# Patient Record
Sex: Female | Born: 1980
Health system: Southern US, Community
[De-identification: ages and names within clinical notes are randomized; demographics above are authoritative.]

## PROBLEM LIST (undated history)

## (undated) DIAGNOSIS — E119 Type 2 diabetes mellitus without complications: Secondary | ICD-10-CM

## (undated) DIAGNOSIS — E785 Hyperlipidemia, unspecified: Secondary | ICD-10-CM

## (undated) DIAGNOSIS — I639 Cerebral infarction, unspecified: Secondary | ICD-10-CM

## (undated) DIAGNOSIS — I10 Essential (primary) hypertension: Secondary | ICD-10-CM

## (undated) HISTORY — DX: Cerebral infarction, unspecified: I63.9

## (undated) HISTORY — PX: NO PAST SURGERIES: SHX2092

## (undated) HISTORY — PX: LOOP RECORDER IMPLANT: SHX5954

## (undated) HISTORY — DX: Hyperlipidemia, unspecified: E78.5

---

## 2001-06-25 ENCOUNTER — Encounter: Payer: Self-pay | Admitting: Emergency Medicine

## 2001-06-25 ENCOUNTER — Emergency Department (HOSPITAL_COMMUNITY): Admission: EM | Admit: 2001-06-25 | Discharge: 2001-06-25 | Payer: Self-pay | Admitting: Emergency Medicine

## 2001-07-04 ENCOUNTER — Inpatient Hospital Stay (HOSPITAL_COMMUNITY): Admission: AD | Admit: 2001-07-04 | Discharge: 2001-07-04 | Payer: Self-pay | Admitting: Obstetrics & Gynecology

## 2001-10-25 ENCOUNTER — Emergency Department (HOSPITAL_COMMUNITY): Admission: EM | Admit: 2001-10-25 | Discharge: 2001-10-25 | Payer: Self-pay | Admitting: Emergency Medicine

## 2002-03-04 ENCOUNTER — Emergency Department (HOSPITAL_COMMUNITY): Admission: EM | Admit: 2002-03-04 | Discharge: 2002-03-04 | Payer: Self-pay | Admitting: Emergency Medicine

## 2003-05-29 ENCOUNTER — Emergency Department (HOSPITAL_COMMUNITY): Admission: EM | Admit: 2003-05-29 | Discharge: 2003-05-29 | Payer: Self-pay | Admitting: Emergency Medicine

## 2007-07-26 ENCOUNTER — Emergency Department (HOSPITAL_COMMUNITY): Admission: EM | Admit: 2007-07-26 | Discharge: 2007-07-27 | Payer: Self-pay | Admitting: Emergency Medicine

## 2008-02-28 ENCOUNTER — Emergency Department (HOSPITAL_COMMUNITY): Admission: EM | Admit: 2008-02-28 | Discharge: 2008-02-28 | Payer: Self-pay | Admitting: Emergency Medicine

## 2008-09-13 ENCOUNTER — Emergency Department (HOSPITAL_COMMUNITY): Admission: EM | Admit: 2008-09-13 | Discharge: 2008-09-13 | Payer: Self-pay | Admitting: Emergency Medicine

## 2010-08-26 ENCOUNTER — Inpatient Hospital Stay (HOSPITAL_COMMUNITY): Admission: AD | Admit: 2010-08-26 | Discharge: 2010-08-26 | Payer: Self-pay | Admitting: Obstetrics & Gynecology

## 2010-09-04 ENCOUNTER — Encounter: Payer: Self-pay | Admitting: Obstetrics and Gynecology

## 2010-09-04 ENCOUNTER — Ambulatory Visit: Payer: Self-pay | Admitting: Obstetrics and Gynecology

## 2010-09-04 LAB — CONVERTED CEMR LAB
Antibody Screen: NEGATIVE
Chlamydia, DNA Probe: NEGATIVE
HCT: 45.8 % (ref 36.0–46.0)
Hemoglobin: 15 g/dL (ref 12.0–15.0)
Hgb A: 97.4 % (ref 96.8–97.8)
Lymphocytes Relative: 30 % (ref 12–46)
Lymphs Abs: 2.6 10*3/uL (ref 0.7–4.0)
MCV: 91.1 fL (ref 78.0–100.0)
Monocytes Relative: 8 % (ref 3–12)
Neutro Abs: 5.4 10*3/uL (ref 1.7–7.7)
Neutrophils Relative %: 62 % (ref 43–77)
Platelets: 225 10*3/uL (ref 150–400)
RDW: 14.4 % (ref 11.5–15.5)
Rubella: 79.6 intl units/mL — ABNORMAL HIGH

## 2010-09-05 ENCOUNTER — Encounter: Payer: Self-pay | Admitting: Obstetrics and Gynecology

## 2010-09-06 ENCOUNTER — Emergency Department (HOSPITAL_COMMUNITY)
Admission: EM | Admit: 2010-09-06 | Discharge: 2010-09-07 | Payer: Self-pay | Source: Home / Self Care | Admitting: Emergency Medicine

## 2010-09-08 ENCOUNTER — Inpatient Hospital Stay (HOSPITAL_COMMUNITY)
Admission: AD | Admit: 2010-09-08 | Discharge: 2010-09-08 | Payer: Self-pay | Source: Home / Self Care | Attending: Obstetrics & Gynecology | Admitting: Obstetrics & Gynecology

## 2010-09-09 ENCOUNTER — Inpatient Hospital Stay (HOSPITAL_COMMUNITY)
Admission: RE | Admit: 2010-09-09 | Discharge: 2010-09-09 | Payer: Self-pay | Source: Home / Self Care | Attending: Family Medicine | Admitting: Family Medicine

## 2010-09-13 ENCOUNTER — Ambulatory Visit: Payer: Self-pay | Admitting: Obstetrics and Gynecology

## 2010-09-29 DEATH — deceased

## 2010-10-16 ENCOUNTER — Encounter (INDEPENDENT_AMBULATORY_CARE_PROVIDER_SITE_OTHER): Payer: Self-pay | Admitting: *Deleted

## 2010-10-16 LAB — CONVERTED CEMR LAB
Basophils Absolute: 0 10*3/uL (ref 0.0–0.1)
Calcium: 9.2 mg/dL (ref 8.4–10.5)
Cholesterol: 213 mg/dL — ABNORMAL HIGH (ref 0–200)
Eosinophils Absolute: 0.1 10*3/uL (ref 0.0–0.7)
Hemoglobin: 15.4 g/dL — ABNORMAL HIGH (ref 12.0–15.0)
Lymphs Abs: 2.7 10*3/uL (ref 0.7–4.0)
Monocytes Absolute: 0.4 10*3/uL (ref 0.1–1.0)
Monocytes Relative: 6 % (ref 3–12)
Neutro Abs: 3.7 10*3/uL (ref 1.7–7.7)
Neutrophils Relative %: 54 % (ref 43–77)
Platelets: 207 10*3/uL (ref 150–400)
Potassium: 4.2 meq/L (ref 3.5–5.3)
RBC: 5.13 M/uL — ABNORMAL HIGH (ref 3.87–5.11)
RDW: 14.2 % (ref 11.5–15.5)
Sodium: 136 meq/L (ref 135–145)
TSH: 0.929 microintl units/mL (ref 0.350–4.500)
Total Protein: 7.2 g/dL (ref 6.0–8.3)
Triglycerides: 104 mg/dL (ref <150)

## 2010-10-31 ENCOUNTER — Emergency Department (HOSPITAL_COMMUNITY)
Admission: EM | Admit: 2010-10-31 | Discharge: 2010-10-31 | Disposition: A | Discharge status: Home / Self Care | Payer: Self-pay | Attending: Emergency Medicine | Admitting: Emergency Medicine

## 2010-10-31 DIAGNOSIS — L03319 Cellulitis of trunk, unspecified: Secondary | ICD-10-CM | POA: Insufficient documentation

## 2010-10-31 DIAGNOSIS — F17200 Nicotine dependence, unspecified, uncomplicated: Secondary | ICD-10-CM | POA: Insufficient documentation

## 2010-10-31 DIAGNOSIS — L02219 Cutaneous abscess of trunk, unspecified: Secondary | ICD-10-CM | POA: Insufficient documentation

## 2010-10-31 DIAGNOSIS — E119 Type 2 diabetes mellitus without complications: Secondary | ICD-10-CM | POA: Insufficient documentation

## 2010-10-31 DIAGNOSIS — I10 Essential (primary) hypertension: Secondary | ICD-10-CM | POA: Insufficient documentation

## 2010-10-31 LAB — GLUCOSE, CAPILLARY: Glucose-Capillary: 221 mg/dL — ABNORMAL HIGH (ref 70–99)

## 2010-11-01 ENCOUNTER — Ambulatory Visit: Admit: 2010-11-01 | Payer: Self-pay | Admitting: Obstetrics and Gynecology

## 2010-11-01 ENCOUNTER — Ambulatory Visit: Payer: Self-pay | Admitting: Obstetrics & Gynecology

## 2010-12-10 LAB — POCT I-STAT, CHEM 8
Calcium, Ion: 1.04 mmol/L — ABNORMAL LOW (ref 1.12–1.32)
Glucose, Bld: 200 mg/dL — ABNORMAL HIGH (ref 70–99)
HCT: 48 % — ABNORMAL HIGH (ref 36.0–46.0)
Hemoglobin: 16.3 g/dL — ABNORMAL HIGH (ref 12.0–15.0)
Potassium: 3.8 mEq/L (ref 3.5–5.1)
Sodium: 135 mEq/L (ref 135–145)
TCO2: 24 mmol/L (ref 0–100)

## 2010-12-10 LAB — CBC
HCT: 44.9 % (ref 36.0–46.0)
Hemoglobin: 15.4 g/dL — ABNORMAL HIGH (ref 12.0–15.0)
MCH: 30.2 pg (ref 26.0–34.0)
MCHC: 34.3 g/dL (ref 30.0–36.0)
MCV: 88 fL (ref 78.0–100.0)
Platelets: 219 10*3/uL (ref 150–400)
RDW: 13.8 % (ref 11.5–15.5)
WBC: 10.2 10*3/uL (ref 4.0–10.5)

## 2010-12-10 LAB — URINE CULTURE
Culture  Setup Time: 201112101105
Culture: NO GROWTH

## 2010-12-10 LAB — DIFFERENTIAL
Lymphs Abs: 3.9 10*3/uL (ref 0.7–4.0)
Monocytes Absolute: 0.9 10*3/uL (ref 0.1–1.0)

## 2010-12-10 LAB — URINE MICROSCOPIC-ADD ON

## 2010-12-10 LAB — GC/CHLAMYDIA PROBE AMP, GENITAL
Chlamydia, DNA Probe: NEGATIVE
GC Probe Amp, Genital: NEGATIVE
GC Probe Amp, Genital: NEGATIVE

## 2010-12-10 LAB — URINALYSIS, ROUTINE W REFLEX MICROSCOPIC
Ketones, ur: 15 mg/dL — AB
Nitrite: NEGATIVE
Protein, ur: 30 mg/dL — AB
Specific Gravity, Urine: 1.031 — ABNORMAL HIGH (ref 1.005–1.030)
pH: 6 (ref 5.0–8.0)

## 2010-12-10 LAB — POCT URINALYSIS DIPSTICK
Bilirubin Urine: NEGATIVE
Glucose, UA: >=1000 mg/dL — AB
Ketones, ur: 15 mg/dL — AB
Urobilinogen, UA: 1 mg/dL (ref 0.0–1.0)
pH: 5.5 (ref 5.0–8.0)

## 2010-12-10 LAB — HCG, QUANTITATIVE, PREGNANCY: hCG, Beta Chain, Quant, S: 3340 m[IU]/mL — ABNORMAL HIGH (ref <5)

## 2010-12-11 LAB — URINE MICROSCOPIC-ADD ON

## 2010-12-11 LAB — COMPREHENSIVE METABOLIC PANEL
BUN: 6 mg/dL (ref 6–23)
CO2: 24 mEq/L (ref 19–32)
Chloride: 101 mEq/L (ref 96–112)
Creatinine, Ser: 0.72 mg/dL (ref 0.4–1.2)
GFR calc non Af Amer: >60 mL/min (ref >60)
Glucose, Bld: 244 mg/dL — ABNORMAL HIGH (ref 70–99)
Total Bilirubin: 0.2 mg/dL — ABNORMAL LOW (ref 0.3–1.2)

## 2010-12-11 LAB — CBC
Hemoglobin: 14.8 g/dL (ref 12.0–15.0)
MCH: 30.3 pg (ref 26.0–34.0)
MCHC: 32.8 g/dL (ref 30.0–36.0)
RDW: 14.4 % (ref 11.5–15.5)
WBC: 10.5 10*3/uL (ref 4.0–10.5)

## 2010-12-11 LAB — URINALYSIS, ROUTINE W REFLEX MICROSCOPIC
Bilirubin Urine: NEGATIVE
Leukocytes, UA: NEGATIVE
Protein, ur: NEGATIVE mg/dL
Urobilinogen, UA: 0.2 mg/dL (ref 0.0–1.0)

## 2010-12-19 ENCOUNTER — Emergency Department (HOSPITAL_COMMUNITY)
Admission: EM | Admit: 2010-12-19 | Discharge: 2010-12-19 | Disposition: A | Discharge status: Home / Self Care | Payer: Self-pay | Attending: Emergency Medicine | Admitting: Emergency Medicine

## 2010-12-19 DIAGNOSIS — Z79899 Other long term (current) drug therapy: Secondary | ICD-10-CM | POA: Insufficient documentation

## 2010-12-19 DIAGNOSIS — I10 Essential (primary) hypertension: Secondary | ICD-10-CM | POA: Insufficient documentation

## 2010-12-19 DIAGNOSIS — E119 Type 2 diabetes mellitus without complications: Secondary | ICD-10-CM | POA: Insufficient documentation

## 2010-12-19 DIAGNOSIS — R110 Nausea: Secondary | ICD-10-CM | POA: Insufficient documentation

## 2010-12-19 DIAGNOSIS — R1013 Epigastric pain: Secondary | ICD-10-CM | POA: Insufficient documentation

## 2010-12-19 LAB — URINALYSIS, ROUTINE W REFLEX MICROSCOPIC
Glucose, UA: 100 mg/dL — AB
Ketones, ur: 40 mg/dL — AB
Specific Gravity, Urine: 1.034 — ABNORMAL HIGH (ref 1.005–1.030)
pH: 5.5 (ref 5.0–8.0)

## 2010-12-19 LAB — COMPREHENSIVE METABOLIC PANEL
AST: 17 U/L (ref 0–37)
Albumin: 4.1 g/dL (ref 3.5–5.2)
Chloride: 104 mEq/L (ref 96–112)
Creatinine, Ser: 0.76 mg/dL (ref 0.4–1.2)
GFR calc Af Amer: >60 mL/min (ref >60)
Potassium: 3.4 mEq/L — ABNORMAL LOW (ref 3.5–5.1)
Total Bilirubin: 0.6 mg/dL (ref 0.3–1.2)
Total Protein: 7.5 g/dL (ref 6.0–8.3)

## 2010-12-19 LAB — URINE MICROSCOPIC-ADD ON

## 2010-12-19 LAB — DIFFERENTIAL
Basophils Relative: 0 % (ref 0–1)
Eosinophils Absolute: 0.1 10*3/uL (ref 0.0–0.7)
Neutrophils Relative %: 66 % (ref 43–77)

## 2010-12-19 LAB — CBC
MCH: 30.4 pg (ref 26.0–34.0)
Platelets: 205 10*3/uL (ref 150–400)
RBC: 5.89 MIL/uL — ABNORMAL HIGH (ref 3.87–5.11)
RDW: 13.3 % (ref 11.5–15.5)
WBC: 9.1 10*3/uL (ref 4.0–10.5)

## 2011-01-16 ENCOUNTER — Emergency Department (HOSPITAL_COMMUNITY): Payer: Self-pay

## 2011-01-16 ENCOUNTER — Emergency Department (HOSPITAL_COMMUNITY)
Admission: EM | Admit: 2011-01-16 | Discharge: 2011-01-16 | Disposition: A | Discharge status: Home / Self Care | Payer: Self-pay | Attending: Emergency Medicine | Admitting: Emergency Medicine

## 2011-01-16 DIAGNOSIS — M545 Low back pain, unspecified: Secondary | ICD-10-CM | POA: Insufficient documentation

## 2011-01-16 DIAGNOSIS — I10 Essential (primary) hypertension: Secondary | ICD-10-CM | POA: Insufficient documentation

## 2011-01-16 DIAGNOSIS — E119 Type 2 diabetes mellitus without complications: Secondary | ICD-10-CM | POA: Insufficient documentation

## 2011-01-16 DIAGNOSIS — M549 Dorsalgia, unspecified: Secondary | ICD-10-CM | POA: Insufficient documentation

## 2011-07-09 LAB — POCT CARDIAC MARKERS
Myoglobin, poc: 50.7
Myoglobin, poc: 54.4
Operator id: 1192
Operator id: 1192
Troponin i, poc: <0.05

## 2011-07-09 LAB — BASIC METABOLIC PANEL
BUN: 5 — ABNORMAL LOW
CO2: 26
Calcium: 9
Chloride: 101
Creatinine, Ser: 0.94
GFR calc Af Amer: >60
Glucose, Bld: 113 — ABNORMAL HIGH

## 2011-07-09 LAB — CBC
MCHC: 32.8
MCV: 87.9
RDW: 14.1 — ABNORMAL HIGH

## 2011-07-09 LAB — DIFFERENTIAL
Basophils Absolute: 0.1
Basophils Relative: 1
Eosinophils Absolute: 0.1
Neutro Abs: 2
Neutrophils Relative %: 36 — ABNORMAL LOW

## 2012-12-27 ENCOUNTER — Emergency Department (HOSPITAL_COMMUNITY): Payer: Self-pay

## 2012-12-27 ENCOUNTER — Emergency Department (HOSPITAL_COMMUNITY)
Admission: EM | Admit: 2012-12-27 | Discharge: 2012-12-27 | Disposition: A | Discharge status: Home / Self Care | Payer: Self-pay | Attending: Emergency Medicine | Admitting: Emergency Medicine

## 2012-12-27 ENCOUNTER — Encounter (HOSPITAL_COMMUNITY): Payer: Self-pay | Admitting: *Deleted

## 2012-12-27 DIAGNOSIS — I10 Essential (primary) hypertension: Secondary | ICD-10-CM | POA: Insufficient documentation

## 2012-12-27 DIAGNOSIS — R079 Chest pain, unspecified: Secondary | ICD-10-CM

## 2012-12-27 DIAGNOSIS — M545 Low back pain, unspecified: Secondary | ICD-10-CM | POA: Insufficient documentation

## 2012-12-27 DIAGNOSIS — F17200 Nicotine dependence, unspecified, uncomplicated: Secondary | ICD-10-CM | POA: Insufficient documentation

## 2012-12-27 DIAGNOSIS — M549 Dorsalgia, unspecified: Secondary | ICD-10-CM

## 2012-12-27 DIAGNOSIS — R000 Tachycardia, unspecified: Secondary | ICD-10-CM | POA: Insufficient documentation

## 2012-12-27 DIAGNOSIS — R0789 Other chest pain: Secondary | ICD-10-CM | POA: Insufficient documentation

## 2012-12-27 DIAGNOSIS — E119 Type 2 diabetes mellitus without complications: Secondary | ICD-10-CM | POA: Insufficient documentation

## 2012-12-27 DIAGNOSIS — R229 Localized swelling, mass and lump, unspecified: Secondary | ICD-10-CM | POA: Insufficient documentation

## 2012-12-27 HISTORY — DX: Type 2 diabetes mellitus without complications: E11.9

## 2012-12-27 HISTORY — DX: Essential (primary) hypertension: I10

## 2012-12-27 LAB — BASIC METABOLIC PANEL
CO2: 23 mEq/L (ref 19–32)
Chloride: 96 mEq/L (ref 96–112)
Creatinine, Ser: 0.61 mg/dL (ref 0.50–1.10)
GFR calc Af Amer: >90 mL/min (ref >90)
Potassium: 4.3 mEq/L (ref 3.5–5.1)

## 2012-12-27 LAB — CBC
HCT: 45.7 % (ref 36.0–46.0)
MCV: 86.4 fL (ref 78.0–100.0)
Platelets: 194 10*3/uL (ref 150–400)
RBC: 5.29 MIL/uL — ABNORMAL HIGH (ref 3.87–5.11)
RDW: 14 % (ref 11.5–15.5)
WBC: 10.2 10*3/uL (ref 4.0–10.5)

## 2012-12-27 LAB — POCT I-STAT TROPONIN I: Troponin i, poc: 0 ng/mL (ref 0.00–0.08)

## 2012-12-27 LAB — D-DIMER, QUANTITATIVE: D-Dimer, Quant: 0.7 ug/mL-FEU — ABNORMAL HIGH (ref 0.00–0.48)

## 2012-12-27 LAB — HEPATIC FUNCTION PANEL
AST: 36 U/L (ref 0–37)
Albumin: 4 g/dL (ref 3.5–5.2)
Alkaline Phosphatase: 95 U/L (ref 39–117)
Total Bilirubin: 0.5 mg/dL (ref 0.3–1.2)

## 2012-12-27 MED ORDER — METHOCARBAMOL 500 MG PO TABS: 500.0000 mg | ORAL_TABLET | Freq: Two times a day (BID) | ORAL | Status: DC

## 2012-12-27 MED ORDER — IBUPROFEN 600 MG PO TABS: 600.0000 mg | ORAL_TABLET | Freq: Four times a day (QID) | ORAL | Status: DC | PRN

## 2012-12-27 MED ORDER — IOHEXOL 350 MG/ML SOLN
100.0000 mL | Freq: Once | INTRAVENOUS | Status: AC | PRN
  Administered 2012-12-27: 100 mL via INTRAVENOUS

## 2012-12-27 MED ORDER — SODIUM CHLORIDE 0.9 % IV BOLUS (SEPSIS)
1000.0000 mL | Freq: Once | INTRAVENOUS | Status: AC
  Administered 2012-12-27: 1000 mL via INTRAVENOUS

## 2012-12-27 MED ORDER — HYDROCODONE-ACETAMINOPHEN 5-325 MG PO TABS: 1.0000 | ORAL_TABLET | Freq: Four times a day (QID) | ORAL | Status: DC | PRN

## 2012-12-27 MED ORDER — GI COCKTAIL ~~LOC~~
30.0000 mL | Freq: Once | ORAL | Status: AC
  Administered 2012-12-27: 30 mL via ORAL
  Filled 2012-12-27: qty 30

## 2012-12-27 NOTE — ED Notes (Addendum)
Pt reports lower back pain and rib pain underneath breast since yesterday. "feels tight". Denies SOB. Pain 5/10. Diarrhea present. Denies n/v. Reports smoked a blunt around 1300.   Addendum:  Pt states that she stopped taking Metformin about 6 months ago.

## 2012-12-27 NOTE — ED Provider Notes (Signed)
History     CSN: [id number]  Arrival date & time 12/27/12  1638   First MD Initiated Contact with Patient 12/27/12 1827      Chief Complaint  Patient presents with  . HR 130, back pain, rib pain     (Consider location/radiation/quality/duration/timing/severity/associated sxs/prior treatment) HPI Comments: PT comes in with cc of tachycardia, back pain, chest pain. Pt has no hx of medical problems and no hx of DVT, PE, or risk factors for the same. States that she has had back pain - lower lumbar region for a while now. Unsure etiology. Pt has no trauma, and she has no associated numbness, weakness, urinary incontinence, urinary retention, bowel incontinence, saddle anesthesia. Pt has epigastric, lower thoracic, substernal chest pain. The pains started this am, is unprovoked and has no precipitating, aggravating or relieving factors. The pain radiates to her sides laterally, but not to the back. No n/v/f/c/dib. Pt is noted to be tachycardic. Admits to smoking, but no illicit substance use.    The history is provided by the patient.    Past Medical History  Diagnosis Date  . Hypertension   . Diabetes mellitus without complication     History reviewed. No pertinent past surgical history.  Family History  Problem Relation Age of Onset  . Diabetes Other     History  Substance Use Topics  . Smoking status: Current Every Day Smoker -- 1.00 packs/day for 15 years  . Smokeless tobacco: Not on file  . Alcohol Use: No    OB History   Grav Para Term Preterm Abortions TAB SAB Ect Mult Living                  Review of Systems  Constitutional: Positive for activity change.  HENT: Positive for facial swelling. Negative for neck pain.   Respiratory: Negative for cough, shortness of breath and wheezing.   Cardiovascular: Positive for chest pain.  Gastrointestinal: Positive for abdominal pain. Negative for nausea, vomiting, diarrhea, constipation, blood in stool and abdominal  distention.  Genitourinary: Negative for hematuria and difficulty urinating.  Musculoskeletal: Positive for back pain.  Skin: Negative for color change.  Neurological: Negative for speech difficulty.  Hematological: Does not bruise/bleed easily.  Psychiatric/Behavioral: Negative for confusion.    Allergies  Review of patient's allergies indicates no known allergies.  Home Medications  No current outpatient prescriptions on file.  BP 128/83  Pulse 113  Temp(Src) 99.4 F (37.4 C) (Oral)  Resp 18  SpO2 99%  Physical Exam  Nursing note and vitals reviewed. Constitutional: She is oriented to person, place, and time. She appears well-developed and well-nourished.  HENT:  Head: Normocephalic and atraumatic.  Eyes: EOM are normal. Pupils are equal, round, and reactive to light.  Neck: Neck supple.  Cardiovascular: Regular rhythm, normal heart sounds and intact distal pulses.   No murmur heard. Pulmonary/Chest: Effort normal. No respiratory distress. She exhibits no tenderness.  Abdominal: Soft. She exhibits no distension. There is no tenderness. There is no rebound and no guarding.  Neurological: She is alert and oriented to person, place, and time.  Skin: Skin is warm and dry.    ED Course  Procedures (including critical care time)  Labs Reviewed  CBC - Abnormal; Notable for the following:    RBC 5.29 (*)    Hemoglobin 15.7 (*)    All other components within normal limits  BASIC METABOLIC PANEL - Abnormal; Notable for the following:    Sodium 131 (*)  Glucose, Bld 272 (*)    All other components within normal limits  D-DIMER, QUANTITATIVE  LIPASE, BLOOD  HEPATIC FUNCTION PANEL  POCT I-STAT TROPONIN I   No results found.   No diagnosis found.    MDM   Date: 12/27/2012  Rate: 129  Rhythm: sinus tachycardia  QRS Axis: normal  Intervals: normal  ST/T Wave abnormalities: nonspecific ST/T changes  Conduction Disutrbances:none  Narrative Interpretation:   Old  EKG Reviewed: none available - t wave inversions in lead 3  Differential diagnosis includes: ACS syndrome CHF exacerbation Valvular disorder Myocarditis Pericarditis Pericardial effusion Pneumonia Pleural effusion Pulmonary edema PE Anemia Musculoskeletal pain  Pt comes in with cc of chest discomfort, some epigastric type chest pain. Pt's pain is anterior only. No redflags for dissection on hx or exam, and no hard risk factors. Low concern for ACS as well. PT is tachycardic - will get dimer to screen for PE, as her WELLS score is 1.5 (PE is not #1 on my ddx).     Derwood Kaplan, MD 12/27/12 1946

## 2012-12-27 NOTE — ED Notes (Signed)
Pt back from CT angio at this time

## 2013-04-03 ENCOUNTER — Emergency Department (HOSPITAL_COMMUNITY)
Admission: EM | Admit: 2013-04-03 | Discharge: 2013-04-03 | Disposition: A | Discharge status: Home / Self Care | Payer: Self-pay | Attending: Emergency Medicine | Admitting: Emergency Medicine

## 2013-04-03 DIAGNOSIS — Z79899 Other long term (current) drug therapy: Secondary | ICD-10-CM | POA: Insufficient documentation

## 2013-04-03 DIAGNOSIS — F17200 Nicotine dependence, unspecified, uncomplicated: Secondary | ICD-10-CM | POA: Insufficient documentation

## 2013-04-03 DIAGNOSIS — E119 Type 2 diabetes mellitus without complications: Secondary | ICD-10-CM

## 2013-04-03 DIAGNOSIS — I10 Essential (primary) hypertension: Secondary | ICD-10-CM

## 2013-04-03 DIAGNOSIS — J029 Acute pharyngitis, unspecified: Secondary | ICD-10-CM

## 2013-04-03 DIAGNOSIS — R070 Pain in throat: Secondary | ICD-10-CM | POA: Insufficient documentation

## 2013-04-03 MED ORDER — AMOXICILLIN 500 MG PO CAPS: 500.0000 mg | ORAL_CAPSULE | Freq: Three times a day (TID) | ORAL | Status: DC

## 2013-04-03 MED ORDER — METFORMIN HCL 1000 MG PO TABS: 1000.0000 mg | ORAL_TABLET | Freq: Two times a day (BID) | ORAL | Status: DC

## 2013-04-03 MED ORDER — LISINOPRIL 20 MG PO TABS: 10.0000 mg | ORAL_TABLET | Freq: Every day | ORAL | Status: DC

## 2013-04-03 NOTE — ED Provider Notes (Signed)
History  This chart was scribed for [facility] Ebony Matthews - PA by Manuela Schwartz, ED scribe. This patient was seen in room WTR9/WTR9 and the patient's care was started at 1624.  CSN: [id number] Arrival date & time 04/03/13  1514  First MD Initiated Contact with Patient 04/03/13 1624     Chief Complaint  Patient presents with  . Neck Pain   Patient is a 32 y.o. female presenting with neck pain. The history is provided by the patient. No language interpreter was used.  Neck Pain Pain location:  L side Quality:  Aching Radiates to: left ear. Pain severity:  Moderate Pain is:  Same all the time Onset quality:  Gradual Duration:  3 days Timing:  Constant Progression:  Worsening Chronicity:  New Context: not fall, not lifting a heavy object and not recent injury   Relieved by:  Nothing Worsened by:  Bending and position Ineffective treatments:  None tried Associated symptoms: no chest pain, no fever, no leg pain, no visual change and no weakness    HPI Comments: Ebony Matthews is a 32 y.o. female who presents to the Emergency Department complaining of constant, gradually worsening left sided neck pain/soreness with associated left ear discomfort onset 3 days ago. She denies any injury or trauma. She states associated achyness throughout her muscles and right arm pain/swelling. She denies any recent illnesses. She states the left side of her neck is tender to touch and more painful when she turns her head right under her jaw. Does not have any tooth pain, no difficulty swallowing or sore throat. Denies trouble breathing, coughing, SOB. She has not tried taking any medicines for this problem. She denies HA, fever/chills. She has been eating/drinking normally. She denies any recent antibiotic use.   She is diabetic and has no PCP     Past Medical History  Diagnosis Date  . Hypertension   . Diabetes mellitus without complication    No past surgical history on file. Family History  Problem  Relation Age of Onset  . Diabetes Other    History  Substance Use Topics  . Smoking status: Current Every Day Smoker -- 1.00 packs/day for 15 years  . Smokeless tobacco: Not on file  . Alcohol Use: No   OB History   Grav Para Term Preterm Abortions TAB SAB Ect Mult Living                 Review of Systems  Constitutional: Negative for fever and chills.  HENT: Positive for neck pain. Negative for congestion and rhinorrhea.   Respiratory: Negative for cough, choking and shortness of breath.   Cardiovascular: Negative for chest pain.  Gastrointestinal: Negative for nausea and vomiting.  Musculoskeletal: Negative for back pain.       Right arm pain/tingling  Skin: Negative for color change and pallor.  Neurological: Negative for weakness.  All other systems reviewed and are negative.   A complete 10 system review of systems was obtained and all systems are negative except as noted in the HPI and PMH.   Allergies  Review of patient's allergies indicates no known allergies.  Home Medications   Current Outpatient Rx  Name  Route  Sig  Dispense  Refill  . amoxicillin (AMOXIL) 500 MG capsule   Oral   Take 1 capsule (500 mg total) by mouth 3 (three) times daily.   21 capsule   0   . HYDROcodone-acetaminophen (NORCO/VICODIN) 5-325 MG per tablet   Oral   Take 1  tablet by mouth every 6 (six) hours as needed for pain.   15 tablet   0   . ibuprofen (ADVIL,MOTRIN) 600 MG tablet   Oral   Take 1 tablet (600 mg total) by mouth every 6 (six) hours as needed for pain.   30 tablet   0   . lisinopril (PRINIVIL,ZESTRIL) 20 MG tablet   Oral   Take 0.5 tablets (10 mg total) by mouth daily.   30 tablet   2   . metFORMIN (GLUCOPHAGE) 1000 MG tablet   Oral   Take 1 tablet (1,000 mg total) by mouth 2 (two) times daily.   60 tablet   2   . methocarbamol (ROBAXIN) 500 MG tablet   Oral   Take 1 tablet (500 mg total) by mouth 2 (two) times daily.   20 tablet   0    Triage  Vitals: BP 157/94  Pulse 89  Temp(Src) 98.7 F (37.1 C) (Oral)  Resp 16  SpO2 100% Physical Exam  Nursing note and vitals reviewed. Constitutional: She is oriented to person, place, and time. She appears well-developed and well-nourished. No distress.  HENT:  Head: Normocephalic and atraumatic. No trismus in the jaw.  Right Ear: Tympanic membrane, external ear and ear canal normal.  Left Ear: Tympanic membrane, external ear and ear canal normal.  Nose: Nose normal. No rhinorrhea. Right sinus exhibits no maxillary sinus tenderness and no frontal sinus tenderness. Left sinus exhibits no maxillary sinus tenderness and no frontal sinus tenderness.  Mouth/Throat: Uvula is midline and mucous membranes are normal. Normal dentition. No dental abscesses or edematous. Oropharyngeal exudate and posterior oropharyngeal edema present. No posterior oropharyngeal erythema or tonsillar abscesses.  No submental edema, tongue not elevated, no trismus. No impending airway obstruction; Pt able to speak full sentences, swallow intact, no drooling, stridor, or tonsillar/uvula displacement. No palatal petechia  Eyes: Conjunctivae and EOM are normal.  Neck: Trachea normal, normal range of motion and full passive range of motion without pain. Neck supple. No rigidity. No tracheal deviation and normal range of motion present. No Brudzinski's sign noted.  Flexion and extension of neck without pain or difficulty. Able to breath without difficulty in extension.  Cardiovascular: Normal rate and regular rhythm.   Pulmonary/Chest: Effort normal and breath sounds normal. No stridor. No respiratory distress. She has no wheezes.  Abdominal: Soft. There is no tenderness.  No obvious evidence of splenomegaly. Non ttp.   Musculoskeletal: Normal range of motion.  Lymphadenopathy:       Head (right side): No preauricular and no posterior auricular adenopathy present.       Head (left side): No preauricular and no posterior  auricular adenopathy present.    She has cervical adenopathy.  Neurological: She is alert and oriented to person, place, and time.  Skin: Skin is warm and dry. No rash noted. She is not diaphoretic.  Psychiatric: She has a normal mood and affect. Her behavior is normal.    ED Course  Procedures (including critical care time) DIAGNOSTIC STUDIES: Oxygen Saturation is 100% on room air, normal by my interpretation.    COORDINATION OF CARE: At 437 PM Discussed treatment plan with patient which includes checking CBG in ED, asymptomatic. [facility] diabetes and hypertension. Refill Metformin and Lisinopril. Rx Amoxicillin for throat infecetion, referral to ENT. Patient agrees.   Labs Reviewed - No data to display No results found. 1. Diabetes mellitus   2. Hypertension   3. Sore  throat     MDM   32 y.o.Tresea Dunson's evaluation in the Emergency Department is complete. It has been determined that no acute conditions requiring further emergency intervention are present at this time. The patient/guardian have been advised of the diagnosis and plan. We have discussed signs and symptoms that warrant return to the ED, such as changes or worsening in symptoms.  Vital signs are stable at discharge. Filed Vitals:   04/03/13 1614  BP: 157/94  Pulse: 89  Temp: 98.7 F (37.1 C)  Resp: 16    Patient/guardian has voiced understanding and agreed to follow-up with the PCP or specialist.  I personally performed the services described in this documentation, which was scribed in my presence. The recorded information has been reviewed and is accurate.   Dorthula Matas, PA-C 04/03/13 1646

## 2013-04-03 NOTE — ED Notes (Signed)
Accu-check 331mg /dl.

## 2013-04-03 NOTE — ED Notes (Signed)
Pt states she has pain and swelling to L side of her neck for the past 3 days and now her L ear hurts. Pt denies any recent illness. Pt also states her R arm has been sore off and on for the past week. Pt denies injury to R arm. Pt ambulatory to exam room with steady gait. Pt arrives with companion.

## 2013-04-03 NOTE — ED Provider Notes (Signed)
Medical screening examination/treatment/procedure(s) were performed by non-physician practitioner and as supervising physician I was immediately available for consultation/collaboration.  Martha K Linker, MD 04/03/13 1714 

## 2013-04-04 LAB — GLUCOSE, CAPILLARY

## 2013-11-14 ENCOUNTER — Emergency Department (HOSPITAL_COMMUNITY)
Admission: EM | Admit: 2013-11-14 | Discharge: 2013-11-15 | Disposition: A | Discharge status: Home / Self Care | Payer: Self-pay | Attending: Emergency Medicine | Admitting: Emergency Medicine

## 2013-11-14 ENCOUNTER — Encounter (HOSPITAL_COMMUNITY): Payer: Self-pay | Admitting: Emergency Medicine

## 2013-11-14 DIAGNOSIS — F17200 Nicotine dependence, unspecified, uncomplicated: Secondary | ICD-10-CM | POA: Insufficient documentation

## 2013-11-14 DIAGNOSIS — B9689 Other specified bacterial agents as the cause of diseases classified elsewhere: Secondary | ICD-10-CM | POA: Insufficient documentation

## 2013-11-14 DIAGNOSIS — B373 Candidiasis of vulva and vagina: Secondary | ICD-10-CM | POA: Insufficient documentation

## 2013-11-14 DIAGNOSIS — N760 Acute vaginitis: Secondary | ICD-10-CM | POA: Insufficient documentation

## 2013-11-14 DIAGNOSIS — Z3202 Encounter for pregnancy test, result negative: Secondary | ICD-10-CM | POA: Insufficient documentation

## 2013-11-14 DIAGNOSIS — I10 Essential (primary) hypertension: Secondary | ICD-10-CM | POA: Insufficient documentation

## 2013-11-14 DIAGNOSIS — L304 Erythema intertrigo: Secondary | ICD-10-CM

## 2013-11-14 DIAGNOSIS — B3731 Acute candidiasis of vulva and vagina: Secondary | ICD-10-CM | POA: Insufficient documentation

## 2013-11-14 DIAGNOSIS — E119 Type 2 diabetes mellitus without complications: Secondary | ICD-10-CM | POA: Insufficient documentation

## 2013-11-14 DIAGNOSIS — Z792 Long term (current) use of antibiotics: Secondary | ICD-10-CM | POA: Insufficient documentation

## 2013-11-14 DIAGNOSIS — A499 Bacterial infection, unspecified: Secondary | ICD-10-CM | POA: Insufficient documentation

## 2013-11-14 DIAGNOSIS — Z79899 Other long term (current) drug therapy: Secondary | ICD-10-CM | POA: Insufficient documentation

## 2013-11-14 DIAGNOSIS — R739 Hyperglycemia, unspecified: Secondary | ICD-10-CM

## 2013-11-14 LAB — CBC WITH DIFFERENTIAL/PLATELET
BASOS PCT: 0 % (ref 0–1)
Basophils Absolute: 0 10*3/uL (ref 0.0–0.1)
EOS ABS: 0.1 10*3/uL (ref 0.0–0.7)
EOS PCT: 1 % (ref 0–5)
HCT: 44.2 % (ref 36.0–46.0)
HEMOGLOBIN: 15.3 g/dL — AB (ref 12.0–15.0)
LYMPHS ABS: 4 10*3/uL (ref 0.7–4.0)
Lymphocytes Relative: 37 % (ref 12–46)
MCH: 30 pg (ref 26.0–34.0)
MCHC: 34.6 g/dL (ref 30.0–36.0)
MCV: 86.7 fL (ref 78.0–100.0)
MONO ABS: 0.9 10*3/uL (ref 0.1–1.0)
Monocytes Relative: 8 % (ref 3–12)
NEUTROS PCT: 54 % (ref 43–77)
Neutro Abs: 5.7 10*3/uL (ref 1.7–7.7)
PLATELETS: 159 10*3/uL (ref 150–400)
RBC: 5.1 MIL/uL (ref 3.87–5.11)
RDW: 14.2 % (ref 11.5–15.5)
WBC: 10.7 10*3/uL — ABNORMAL HIGH (ref 4.0–10.5)

## 2013-11-14 LAB — COMPREHENSIVE METABOLIC PANEL
ALK PHOS: 100 U/L (ref 39–117)
ALT: 15 U/L (ref 0–35)
AST: 14 U/L (ref 0–37)
Albumin: 3.8 g/dL (ref 3.5–5.2)
BUN: 9 mg/dL (ref 6–23)
CHLORIDE: 93 meq/L — AB (ref 96–112)
CO2: 22 mEq/L (ref 19–32)
CREATININE: 0.73 mg/dL (ref 0.50–1.10)
Calcium: 9.8 mg/dL (ref 8.4–10.5)
GLUCOSE: 451 mg/dL — AB (ref 70–99)
POTASSIUM: 4.1 meq/L (ref 3.7–5.3)
Sodium: 130 mEq/L — ABNORMAL LOW (ref 137–147)
Total Protein: 7.7 g/dL (ref 6.0–8.3)

## 2013-11-14 LAB — URINALYSIS, ROUTINE W REFLEX MICROSCOPIC
Bilirubin Urine: NEGATIVE
Glucose, UA: >1000 mg/dL — AB
Hgb urine dipstick: NEGATIVE
KETONES UR: NEGATIVE mg/dL
LEUKOCYTES UA: NEGATIVE
NITRITE: NEGATIVE
PROTEIN: NEGATIVE mg/dL
Specific Gravity, Urine: 1.034 — ABNORMAL HIGH (ref 1.005–1.030)
UROBILINOGEN UA: 1 mg/dL (ref 0.0–1.0)
pH: 6 (ref 5.0–8.0)

## 2013-11-14 LAB — GLUCOSE, CAPILLARY
Glucose-Capillary: 289 mg/dL — ABNORMAL HIGH (ref 70–99)
Glucose-Capillary: 463 mg/dL — ABNORMAL HIGH (ref 70–99)

## 2013-11-14 LAB — URINE MICROSCOPIC-ADD ON

## 2013-11-14 LAB — WET PREP, GENITAL
TRICH WET PREP: NONE SEEN
Yeast Wet Prep HPF POC: NONE SEEN

## 2013-11-14 LAB — POCT PREGNANCY, URINE: Preg Test, Ur: NEGATIVE

## 2013-11-14 MED ORDER — SODIUM CHLORIDE 0.9 % IV BOLUS (SEPSIS)
2000.0000 mL | Freq: Once | INTRAVENOUS | Status: AC
  Administered 2013-11-14: 2000 mL via INTRAVENOUS

## 2013-11-14 MED ORDER — FLUCONAZOLE 150 MG PO TABS
150.0000 mg | ORAL_TABLET | Freq: Once | ORAL | Status: AC
  Administered 2013-11-14: 150 mg via ORAL
  Filled 2013-11-14: qty 1

## 2013-11-14 MED ORDER — METFORMIN HCL 1000 MG PO TABS: 1000.0000 mg | ORAL_TABLET | Freq: Two times a day (BID) | ORAL | Status: DC

## 2013-11-14 MED ORDER — METRONIDAZOLE 500 MG PO TABS: 500.0000 mg | ORAL_TABLET | Freq: Two times a day (BID) | ORAL | Status: DC

## 2013-11-14 MED ORDER — METFORMIN HCL 500 MG PO TABS
1000.0000 mg | ORAL_TABLET | Freq: Once | ORAL | Status: AC
  Administered 2013-11-14: 1000 mg via ORAL
  Filled 2013-11-14: qty 2

## 2013-11-14 MED ORDER — FLUCONAZOLE 150 MG PO TABS: 150.0000 mg | ORAL_TABLET | Freq: Every day | ORAL | Status: AC

## 2013-11-14 NOTE — ED Notes (Signed)
Patient is alert and oriented x3.  She is complaining of a vaginal rash with no drainage  Along with a rash along the top of her breast and around the nipples.  Currently she rates Her pain 2 of 10 from the itching.

## 2013-11-14 NOTE — ED Provider Notes (Signed)
CSN: [id number]     Arrival date & time 11/14/13  2125 History   First MD Initiated Contact with Patient 11/14/13 2139     Chief Complaint  Patient presents with  . Vaginal Itching  . Rash    breast     (Consider location/radiation/quality/duration/timing/severity/associated sxs/prior Treatment) HPI Ebony Matthews is a 33 y.o. female who presents to ED with complaint of rash to bilateral breasts and vaginal irritation. Patient states she noticed a rash around her breasts approximately week ago. She states the rash is not itchy. States at times he feels like her nipples get hard and they hurt. She denies any injuries to the breast. She denies nipple discharge. Patient also admits to vaginal itching and irritation. She denies any discharge. She states she is sexually active using condoms, she's not on any birth control. She denies any recent antibiotics. She states she is diabetic but did not check her blood sugar. She admits to missing multiple doses of metformin. She currently does not have a primary care Dr. She denies abdominal pain. No fever, chills. No other complaints  Past Medical History  Diagnosis Date  . Hypertension   . Diabetes mellitus without complication    History reviewed. No pertinent past surgical history. Family History  Problem Relation Age of Onset  . Diabetes Other    History  Substance Use Topics  . Smoking status: Current Every Day Smoker -- 1.00 packs/day for 15 years  . Smokeless tobacco: Not on file  . Alcohol Use: No   OB History   Grav Para Term Preterm Abortions TAB SAB Ect Mult Living                 Review of Systems  Constitutional: Negative for fever and chills.  Respiratory: Negative for cough, chest tightness and shortness of breath.   Cardiovascular: Negative for chest pain, palpitations and leg swelling.  Gastrointestinal: Negative for nausea, vomiting, abdominal pain and diarrhea.  Genitourinary: Positive for vaginal pain. Negative for  dysuria, frequency, hematuria, flank pain, vaginal bleeding, vaginal discharge, genital sores, pelvic pain and dyspareunia.  Musculoskeletal: Negative for arthralgias, myalgias, neck pain and neck stiffness.  Skin: Positive for rash.  Neurological: Negative for dizziness, weakness and headaches.  All other systems reviewed and are negative.      Allergies  Review of patient's allergies indicates no known allergies.  Home Medications   Current Outpatient Rx  Name  Route  Sig  Dispense  Refill  . amoxicillin (AMOXIL) 500 MG capsule   Oral   Take 1 capsule (500 mg total) by mouth 3 (three) times daily.   21 capsule   0   . HYDROcodone-acetaminophen (NORCO/VICODIN) 5-325 MG per tablet   Oral   Take 1 tablet by mouth every 6 (six) hours as needed for pain.   15 tablet   0   . ibuprofen (ADVIL,MOTRIN) 600 MG tablet   Oral   Take 1 tablet (600 mg total) by mouth every 6 (six) hours as needed for pain.   30 tablet   0   . lisinopril (PRINIVIL,ZESTRIL) 20 MG tablet   Oral   Take 0.5 tablets (10 mg total) by mouth daily.   30 tablet   2   . metFORMIN (GLUCOPHAGE) 1000 MG tablet   Oral   Take 1 tablet (1,000 mg total) by mouth 2 (two) times daily.   60 tablet   2   . methocarbamol (ROBAXIN) 500 MG tablet   Oral   Take  1 tablet (500 mg total) by mouth 2 (two) times daily.   20 tablet   0    BP 153/83  Pulse 102  Temp(Src) 99.1 F (37.3 C) (Oral)  Resp 20  Ht 5\' 7"  (1.702 m)  Wt 210 lb (95.255 kg)  BMI 32.88 kg/m2  SpO2 99%  LMP 10/18/2013 Physical Exam  Nursing note and vitals reviewed. Constitutional: She appears well-developed and well-nourished. No distress.  HENT:  Head: Normocephalic.  Eyes: Conjunctivae are normal.  Neck: Neck supple.  Cardiovascular: Normal rate, regular rhythm and normal heart sounds.   Pulmonary/Chest: Effort normal and breath sounds normal. No respiratory distress. She has no wheezes. She has no rales.  Abdominal: Soft. Bowel  sounds are normal. She exhibits no distension. There is no tenderness. There is no rebound.  Genitourinary:  Skin chafing noted around nipples and skin just above nipples bilaterally. Normal external vaginal genitalia with exception of mild erythema and scaling over labia majora.. Vaginal canal normal with white thin discharge. No CMT. No uterine or adnexal tenderness  Musculoskeletal: She exhibits no edema.  Neurological: She is alert.  Skin: Skin is warm and dry.  Psychiatric: She has a normal mood and affect. Her behavior is normal.    ED Course  Procedures (including critical care time) Labs Review Labs Reviewed  WET PREP, GENITAL - Abnormal; Notable for the following:    Clue Cells Wet Prep HPF POC MANY (*)    WBC, Wet Prep HPF POC MODERATE (*)    All other components within normal limits  GLUCOSE, CAPILLARY - Abnormal; Notable for the following:    Glucose-Capillary 463 (*)    All other components within normal limits  CBC WITH DIFFERENTIAL - Abnormal; Notable for the following:    WBC 10.7 (*)    Hemoglobin 15.3 (*)    All other components within normal limits  COMPREHENSIVE METABOLIC PANEL - Abnormal; Notable for the following:    Sodium 130 (*)    Chloride 93 (*)    Glucose, Bld 451 (*)    Total Bilirubin <0.2 (*)    All other components within normal limits  URINALYSIS, ROUTINE W REFLEX MICROSCOPIC - Abnormal; Notable for the following:    APPearance CLOUDY (*)    Specific Gravity, Urine 1.034 (*)    Glucose, UA >1000 (*)    All other components within normal limits  URINE MICROSCOPIC-ADD ON - Abnormal; Notable for the following:    Squamous Epithelial / LPF FEW (*)    All other components within normal limits  GLUCOSE, CAPILLARY - Abnormal; Notable for the following:    Glucose-Capillary 289 (*)    All other components within normal limits  GC/CHLAMYDIA PROBE AMP  POCT PREGNANCY, URINE   Imaging Review No results found.  EKG Interpretation   None        MDM   Final diagnoses:  None    Patient with vaginal irritation hyperglycemia. Her external vaginal exam is concerning for possible candida vaginitis.    her initial CBG is 463. 2 L of fluids as well as her regular 1000 mg of metformin given in emergency department. Blood sugar is down to 289. Her 9 Is 15, and acute onset urine, do not think this is DKA. Patient has no symptoms of hyperglycemia. She is in no distress. I will discharge her home with Diflucan, continue metformin, Flagyl for BV does feel like her. Followup with primary care Dr. Discussed diet changes, importance of taking her medications. Patient voiced  understanding.  Filed Vitals:   11/14/13 2132 11/14/13 2359  BP: 153/83 138/79  Pulse: 102 88  Temp: 99.1 F (37.3 C)   TempSrc: Oral   Resp: 20 18  Height: 5\' 7"  (1.702 m)   Weight: 210 lb (95.255 kg)   SpO2: 99% 100%     Myriam Jacobsonatyana A Lorraine Cimmino, PA-C 11/15/13 0013

## 2013-11-14 NOTE — Discharge Instructions (Signed)
You were treated today for vaginal yeast infection. Take Flagyl as prescribed until all gone for bacterial vaginosis. Take metformin for your diabetes. Did not Miss any doses. Cut down on carbohydrates and sugars. Followup closely with primary care Dr.   Hyperglycemia Hyperglycemia occurs when the glucose (sugar) in your blood is too high. Hyperglycemia can happen for many reasons, but it most often happens to people who do not know they have diabetes or are not managing their diabetes properly.  CAUSES  Whether you have diabetes or not, there are other causes of hyperglycemia. Hyperglycemia can occur when you have diabetes, but it can also occur in other situations that you might not be as aware of, such as: Diabetes  If you have diabetes and are having problems controlling your blood glucose, hyperglycemia could occur because of some of the following reasons:  Not following your meal plan.  Not taking your diabetes medications or not taking it properly.  Exercising less or doing less activity than you normally do.  Being sick. Pre-diabetes  This cannot be ignored. Before people develop Type 2 diabetes, they almost always have "pre-diabetes." This is when your blood glucose levels are higher than normal, but not yet high enough to be diagnosed as diabetes. Research has shown that some long-term damage to the body, especially the heart and circulatory system, may already be occurring during pre-diabetes. If you take action to manage your blood glucose when you have pre-diabetes, you may delay or prevent Type 2 diabetes from developing. Stress  If you have diabetes, you may be "diet" controlled or on oral medications or insulin to control your diabetes. However, you may find that your blood glucose is higher than usual in the hospital whether you have diabetes or not. This is often referred to as "stress hyperglycemia." Stress can elevate your blood glucose. This happens because of hormones put  out by the body during times of stress. If stress has been the cause of your high blood glucose, it can be followed regularly by your caregiver. That way he/she can make sure your hyperglycemia does not continue to get worse or progress to diabetes. Steroids  Steroids are medications that act on the infection fighting system (immune system) to block inflammation or infection. One side effect can be a rise in blood glucose. Most people can produce enough extra insulin to allow for this rise, but for those who cannot, steroids make blood glucose levels go even higher. It is not unusual for steroid treatments to "uncover" diabetes that is developing. It is not always possible to determine if the hyperglycemia will go away after the steroids are stopped. A special blood test called an A1c is sometimes done to determine if your blood glucose was elevated before the steroids were started. SYMPTOMS  Thirsty.  Frequent urination.  Dry mouth.  Blurred vision.  Tired or fatigue.  Weakness.  Sleepy.  Tingling in feet or leg. DIAGNOSIS  Diagnosis is made by monitoring blood glucose in one or all of the following ways:  A1c test. This is a chemical found in your blood.  Fingerstick blood glucose monitoring.  Laboratory results. TREATMENT  First, knowing the cause of the hyperglycemia is important before the hyperglycemia can be treated. Treatment may include, but is not be limited to:  Education.  Change or adjustment in medications.  Change or adjustment in meal plan.  Treatment for an illness, infection, etc.  More frequent blood glucose monitoring.  Change in exercise plan.  Decreasing or  stopping steroids.  Lifestyle changes. HOME CARE INSTRUCTIONS   Test your blood glucose as directed.  Exercise regularly. Your caregiver will give you instructions about exercise. Pre-diabetes or diabetes which comes on with stress is helped by exercising.  Eat wholesome, balanced meals.  Eat often and at regular, fixed times. Your caregiver or nutritionist will give you a meal plan to guide your sugar intake.  Being at an ideal weight is important. If needed, losing as little as 10 to 15 pounds may help improve blood glucose levels. SEEK MEDICAL CARE IF:   You have questions about medicine, activity, or diet.  You continue to have symptoms (problems such as increased thirst, urination, or weight gain). SEEK IMMEDIATE MEDICAL CARE IF:   You are vomiting or have diarrhea.  Your breath smells fruity.  You are breathing faster or slower.  You are very sleepy or incoherent.  You have numbness, tingling, or pain in your feet or hands.  You have chest pain.  Your symptoms get worse even though you have been following your caregiver's orders.  If you have any other questions or concerns. Document Released: 03/11/2001 Document Revised: 12/08/2011 Document Reviewed: 01/12/2012 [facility] Patient Information 2014 [place], [place].  Bacterial Vaginosis Bacterial vaginosis is a vaginal infection that occurs when the normal balance of bacteria in the vagina is disrupted. It results from an overgrowth of certain bacteria. This is the most common vaginal infection in women of childbearing age. Treatment is important to prevent complications, especially in pregnant women, as it can cause a premature delivery. CAUSES  Bacterial vaginosis is caused by an increase in harmful bacteria that are normally present in smaller amounts in the vagina. Several different kinds of bacteria can cause bacterial vaginosis. However, the reason that the condition develops is not fully understood. RISK FACTORS Certain activities or behaviors can put you at an increased risk of developing bacterial vaginosis, including:  Having a new sex partner or multiple sex partners.  Douching.  Using an intrauterine device (IUD) for contraception. Women do not get bacterial vaginosis from toilet seats, bedding,  swimming pools, or contact with objects around them. SIGNS AND SYMPTOMS  Some women with bacterial vaginosis have no signs or symptoms. Common symptoms include:  Grey vaginal discharge.  A fishlike odor with discharge, especially after sexual intercourse.  Itching or burning of the vagina and vulva.  Burning or pain with urination. DIAGNOSIS  Your health care provider will take a medical history and examine the vagina for signs of bacterial vaginosis. A sample of vaginal fluid may be taken. Your health care provider will look at this sample under a microscope to check for bacteria and abnormal cells. A vaginal pH test may also be done.  TREATMENT  Bacterial vaginosis may be treated with antibiotic medicines. These may be given in the form of a pill or a vaginal cream. A second round of antibiotics may be prescribed if the condition comes back after treatment.  HOME CARE INSTRUCTIONS   Only take over-the-counter or prescription medicines as directed by your health care provider.  If antibiotic medicine was prescribed, take it as directed. Make sure you finish it even if you start to feel better.  Do not have sex until treatment is completed.  Tell all sexual partners that you have a vaginal infection. They should see their health care provider and be treated if they have problems, such as a mild rash or itching.  Practice safe sex by using condoms and only having one sex  partner. SEEK MEDICAL CARE IF:   Your symptoms are not improving after 3 days of treatment.  You have increased discharge or pain.  You have a fever. MAKE SURE YOU:   Understand these instructions.  Will watch your condition.  Will get help right away if you are not doing well or get worse. FOR MORE INFORMATION  Centers for Disease Control and Prevention, Division of STD Prevention: [employer] (ASHA): www.ashastd.org  Document Released: 09/15/2005 Document Revised:  07/06/2013 Document Reviewed: 04/27/2013 [facility] Patient Information 2014 [place], [place].

## 2013-11-15 LAB — GC/CHLAMYDIA PROBE AMP
CT Probe RNA: NEGATIVE
GC Probe RNA: NEGATIVE

## 2013-11-15 NOTE — ED Provider Notes (Signed)
Medical screening examination/treatment/procedure(s) were performed by non-physician practitioner and as supervising physician I was immediately available for consultation/collaboration.  EKG Interpretation   None        Shalunda Lindh, MD 11/15/13 1325 

## 2014-02-22 ENCOUNTER — Encounter (HOSPITAL_COMMUNITY): Payer: Self-pay | Admitting: *Deleted

## 2014-02-22 ENCOUNTER — Inpatient Hospital Stay (HOSPITAL_COMMUNITY)
Admission: AD | Admit: 2014-02-22 | Discharge: 2014-02-22 | Disposition: A | Discharge status: Home / Self Care | Payer: Self-pay | Source: Ambulatory Visit | Attending: Family Medicine | Admitting: Family Medicine

## 2014-02-22 DIAGNOSIS — N946 Dysmenorrhea, unspecified: Secondary | ICD-10-CM | POA: Insufficient documentation

## 2014-02-22 DIAGNOSIS — G8929 Other chronic pain: Secondary | ICD-10-CM

## 2014-02-22 DIAGNOSIS — E119 Type 2 diabetes mellitus without complications: Secondary | ICD-10-CM | POA: Insufficient documentation

## 2014-02-22 DIAGNOSIS — I10 Essential (primary) hypertension: Secondary | ICD-10-CM | POA: Insufficient documentation

## 2014-02-22 DIAGNOSIS — R109 Unspecified abdominal pain: Secondary | ICD-10-CM | POA: Insufficient documentation

## 2014-02-22 DIAGNOSIS — M549 Dorsalgia, unspecified: Secondary | ICD-10-CM

## 2014-02-22 DIAGNOSIS — F17200 Nicotine dependence, unspecified, uncomplicated: Secondary | ICD-10-CM | POA: Insufficient documentation

## 2014-02-22 LAB — COMPREHENSIVE METABOLIC PANEL
ALBUMIN: 3.6 g/dL (ref 3.5–5.2)
ALT: 11 U/L (ref 0–35)
AST: 11 U/L (ref 0–37)
Alkaline Phosphatase: 89 U/L (ref 39–117)
BUN: 7 mg/dL (ref 6–23)
CALCIUM: 9.3 mg/dL (ref 8.4–10.5)
CO2: 27 mEq/L (ref 19–32)
CREATININE: 0.7 mg/dL (ref 0.50–1.10)
Chloride: 98 mEq/L (ref 96–112)
GFR calc Af Amer: >90 mL/min (ref >90)
GFR calc non Af Amer: >90 mL/min (ref >90)
Glucose, Bld: 283 mg/dL — ABNORMAL HIGH (ref 70–99)
Potassium: 3.8 mEq/L (ref 3.7–5.3)
SODIUM: 136 meq/L — AB (ref 137–147)
Total Bilirubin: <0.2 mg/dL — ABNORMAL LOW (ref 0.3–1.2)
Total Protein: 7 g/dL (ref 6.0–8.3)

## 2014-02-22 LAB — WET PREP, GENITAL
TRICH WET PREP: NONE SEEN
Yeast Wet Prep HPF POC: NONE SEEN

## 2014-02-22 LAB — URINALYSIS, ROUTINE W REFLEX MICROSCOPIC
Bilirubin Urine: NEGATIVE
GLUCOSE, UA: 500 mg/dL — AB
KETONES UR: 15 mg/dL — AB
LEUKOCYTES UA: NEGATIVE
NITRITE: NEGATIVE
Protein, ur: 30 mg/dL — AB
Specific Gravity, Urine: >1.03 — ABNORMAL HIGH (ref 1.005–1.030)
Urobilinogen, UA: 0.2 mg/dL (ref 0.0–1.0)
pH: 6 (ref 5.0–8.0)

## 2014-02-22 LAB — URINE MICROSCOPIC-ADD ON

## 2014-02-22 LAB — POCT PREGNANCY, URINE: PREG TEST UR: NEGATIVE

## 2014-02-22 LAB — GLUCOSE, CAPILLARY: Glucose-Capillary: 299 mg/dL — ABNORMAL HIGH (ref 70–99)

## 2014-02-22 MED ORDER — IBUPROFEN 600 MG PO TABS: 600.0000 mg | ORAL_TABLET | Freq: Four times a day (QID) | ORAL | Status: DC | PRN

## 2014-02-22 MED ORDER — LISINOPRIL 20 MG PO TABS: 10.0000 mg | ORAL_TABLET | Freq: Every day | ORAL | Status: DC

## 2014-02-22 MED ORDER — METFORMIN HCL 1000 MG PO TABS: 1000.0000 mg | ORAL_TABLET | Freq: Two times a day (BID) | ORAL | Status: DC

## 2014-02-22 NOTE — Discharge Instructions (Signed)
Dysmenorrhea Dysmenorrhea is pain during a menstrual period. You will have pain in the lower belly (abdomen). The pain is caused by the tightening (contracting) of the muscles of the uterus. The pain can be minor or severe. Headache, feeling sick to your stomach (nausea), throwing up (vomiting), or low back pain may occur with this condition. HOME CARE  Only take medicine as told by your doctor.  Place a heating pad or hot water bottle on your lower back or belly. Do not sleep with a heating pad.  Exercise may help lessen the pain.  Massage the lower back or belly.  Stop smoking.  Avoid alcohol and caffeine. GET HELP IF:   Your pain does not get better with medicine.  You have pain during sex.  Your pain gets worse while taking pain medicine.  Your period bleeding is heavier than normal.  You keep feeling sick to your stomach or keep throwing up. GET HELP RIGHT AWAY IF: You pass out (faint). Document Released: 12/12/2008 Document Revised: 05/18/2013 Document Reviewed: 03/03/2013 [facility] Patient Information 2014 [place], [place]. Blood Glucose Monitoring, Adult Monitoring your blood glucose (also know as blood sugar) helps you to manage your diabetes. It also helps you and your health care provider monitor your diabetes and determine how well your treatment plan is working. WHY SHOULD YOU MONITOR YOUR BLOOD GLUCOSE?  It can help you understand how food, exercise, and medicine affect your blood glucose.  It allows you to know what your blood glucose is at any given moment. You can quickly tell if you are having low blood glucose (hypoglycemia) or high blood glucose (hyperglycemia).  It can help you and your health care provider know how to adjust your medicines.  It can help you understand how to manage an illness or adjust medicine for exercise. WHEN SHOULD YOU TEST? Your health care provider will help you decide how often you should check your blood glucose. This may depend  on the type of diabetes you have, your diabetes control, or the types of medicines you are taking. Be sure to write down all of your blood glucose readings so that this information can be reviewed with your health care provider. See below for examples of testing times that your health care provider may suggest. Type 1 Diabetes  Test 4 times a day if you are in good control, using an insulin pump, or perform multiple daily injections.  If your diabetes is not well-controlled or if you are sick, you may need to monitor more often.  It is a good idea to also monitor:  Before and after exercise.  Between meals and 2 hours after a meal.  Occasionally between 2:00 to 3:00 am. Type 2 Diabetes  It can vary with each person, but generally, if you are on insulin, test 4 times a day.  If you take medicines by mouth (orally), test 2 times a day.  If you are on a controlled diet, test once a day.  If your diabetes is not well controlled or if you are sick, you may need to monitor more often. HOW TO MONITOR YOUR BLOOD GLUCOSE Supplies Needed  Blood glucose meter.  Test strips for your meter. Each meter has its own strips. You must use the strips that go with your own meter.  A pricking needle (lancet).  A device that holds the lancet (lancing device).  A journal or log book to write down your results. Procedure  Wash your hands with soap and water. Alcohol is  not preferred.  Prick the side of your finger (not the tip) with the lancet.  Gently milk the finger until a small drop of blood appears.  Follow the instructions that come with your meter for inserting the test strip, applying blood to the strip, and using your blood glucose meter. Other Areas to Get Blood for Testing Some meters allow you to use other areas of your body (other than your finger) to test your blood. These areas are called alternative sites. The most common alternative sites are:  The forearm.  The  thigh.  The back area of the lower leg.  The palm of the hand. The blood flow in these areas is slower. Therefore, the blood glucose values you get may be delayed, and the numbers are different from what you would get from your fingers. Do not use alternative sites if you think you are having hypoglycemia. Your reading will not be accurate. Always use a finger if you are having hypoglycemia. Also, if you cannot feel your lows (hypoglycemia unawareness), always use your fingers for your blood glucose checks. ADDITIONAL TIPS FOR GLUCOSE MONITORING  Do not reuse lancets.  Always carry your supplies with you.  All blood glucose meters have a 24-hour "hotline" number to call if you have questions or need help.  Adjust (calibrate) your blood glucose meter with a control solution after finishing a few boxes of strips. BLOOD GLUCOSE RECORD KEEPING It is a good idea to keep a daily record or log of your blood glucose readings. Most glucose meters, if not all, keep your glucose records stored in the meter. Some meters come with the ability to download your records to your home computer. Keeping a record of your blood glucose readings is especially helpful if you are wanting to look for patterns. Make notes to go along with the blood glucose readings because you might forget what happened at that exact time. Keeping good records helps you and your health care provider to work together to achieve good diabetes management.  Document Released: 09/18/2003 Document Revised: 05/18/2013 Document Reviewed: 02/07/2013 [facility] Patient Information 2014 [place], [place]. Hypertension As your heart beats, it forces blood through your arteries. This force is your blood pressure. If the pressure is too high, it is called hypertension (HTN) or high blood pressure. HTN is dangerous because you may have it and not know it. High blood pressure may mean that your heart has to work harder to pump blood. Your arteries may be  narrow or stiff. The extra work puts you at risk for heart disease, stroke, and other problems.  Blood pressure consists of two numbers, a higher number over a lower, 110/72, for example. It is stated as "110 over 72." The ideal is below 120 for the top number (systolic) and under 80 for the bottom (diastolic). Write down your blood pressure today. You should pay close attention to your blood pressure if you have certain conditions such as:  Heart failure.  Prior heart attack.  Diabetes  Chronic kidney disease.  Prior stroke.  Multiple risk factors for heart disease. To see if you have HTN, your blood pressure should be measured while you are seated with your arm held at the level of the heart. It should be measured at least twice. A one-time elevated blood pressure reading (especially in the Emergency Department) does not mean that you need treatment. There may be conditions in which the blood pressure is different between your right and left arms. It is important  to see your caregiver soon for a recheck. Most people have essential hypertension which means that there is not a specific cause. This type of high blood pressure may be lowered by changing lifestyle factors such as:  Stress.  Smoking.  Lack of exercise.  Excessive weight.  Drug/tobacco/alcohol use.  Eating less salt. Most people do not have symptoms from high blood pressure until it has caused damage to the body. Effective treatment can often prevent, delay or reduce that damage. TREATMENT  When a cause has been identified, treatment for high blood pressure is directed at the cause. There are a large number of medications to treat HTN. These fall into several categories, and your caregiver will help you select the medicines that are best for you. Medications may have side effects. You should review side effects with your caregiver. If your blood pressure stays high after you have made lifestyle changes or started on  medicines,   Your medication(s) may need to be changed.  Other problems may need to be addressed.  Be certain you understand your prescriptions, and know how and when to take your medicine.  Be sure to follow up with your caregiver within the time frame advised (usually within two weeks) to have your blood pressure rechecked and to review your medications.  If you are taking more than one medicine to lower your blood pressure, make sure you know how and at what times they should be taken. Taking two medicines at the same time can result in blood pressure that is too low. SEEK IMMEDIATE MEDICAL CARE IF:  You develop a severe headache, blurred or changing vision, or confusion.  You have unusual weakness or numbness, or a faint feeling.  You have severe chest or abdominal pain, vomiting, or breathing problems. MAKE SURE YOU:   Understand these instructions.  Will watch your condition.  Will get help right away if you are not doing well or get worse. Document Released: 09/15/2005 Document Revised: 12/08/2011 Document Reviewed: 05/05/2008 [facility] Patient Information 2014 [place], [place].  [facility] Guide (Revised August 2014)   Chronic Pain Problems:    [place] [facility]:  [phone]           Patients need to be referred by their primary care doctor/specialist  Insufficient Money for Medicine:           United Way: call "211"     MAP Program at [facility] Department - [place] [phone] or [place] [phone]            No Primary Care Doctor:  To locate a primary care doctor that accepts your insurance or provides certain services:           [place] [facility]: [phone]           Physician Referral Service: [phone] ask for My Rives   If no insurance, you need to see if you qualify for [facility] orange card, call to set      up appointment for eligibility/enrollment at [phone] or [phone] or visit [facility]. of Health and [employer] ([address], [place] and [address] [place] [place]) to meet with a [facility] enrollment specialist.  Agencies that provide inexpensive (sliding fee scale) medical care:        [facility] - Family Medicine at [place] - [phone]      [facility]  -  [facility] - [phone]  [facility] - [phone]      [facility] - [phone]      [facility] [phone]- [phone]      [facility] [phone]   [facility] - [facility] @ [place] [phone]- [phone]   [facility] - [facility] @ [place] - [phone]   [facility]: [phone]    Women's Clinic: [phone]    Planned Parenthood: [phone]    [facility]- [phone]    Medicaid-accepting [facility] Providers:           [facility] - [phone] (No Family Planning accepted)          [address], [phone], Mon-Fri 9am-5pm          [facility] - [phone]   [address], [address] [place], Mon-Thursday 8am-5pm, Fri 8am-noon   [facility] - [phone]          [address], [address], Mon-Fri 7:30am-4:30pm          [facility] - [phone]          [address], [place] 8am-5pm          [facility] - [phone]          [address], [address]          Only accepts [place] Goldman Sachsccess Medicaid patients after they have their name applied to their card  Self Pay (no insurance) in [place] [facility] [place]:           Sickle Cell Patients:    [address], [phone] [facility]:   [address], [place] [phone]       [facility]   [address] [place], [place] [phone]  [facility]:   [address], [phone]          [facility]           [address], [phone] [facility]   [address], [phone]           [facility] [place]           [address], [address], [place] [phone]        [facility] - [address], [address]           [phone], Mon-Fri 9am-7pm, [place] 9am-1pm          [facility] - Family Medicine @ [facility]          [address], [phone]          [facility] - [facility]           [address], [phone] [facility] - [facility] - [place]   [address], [place] [phone]          [facility]           [address], [phone]  [facility] - [facility]    [address] [place], [phone] [facility] - [facility]   [address], [phone]  Dr. Julio Sicks           [address], [address], [place], [phone]          [facility]           [address] [phone]          [facility]             [address] [phone]          [facility]           [address], [phone], 1st & 3rd Saturday every month, 10am-1pm  OTHERS:  [employer]  (Immigration [employer] Only)  [phone] (Thursday only)  Strategies for finding a Primary Care Provider:  1) Find a Doctor and Pay Out of Pocket  Although you won't have to find out who is covered by your insurance plan, it is a good idea to ask around and get recommendations. You will then need to call the office and see if the doctor you have chosen will accept you as a new patient and what types of options they offer for patients who are self-pay. Some doctors offer discounts or will  set up payment plans for their patients who do not have insurance, but you will need to ask so you aren't surprised when you get to your appointment.  2) Contact [place] [employer] - To see if you qualify for orange card access to healthcare safety net providers.  Call for appointment for eligibility/enrollment at [phone] or [phone]. (Uninsured, 0-200% FPL, qualifying info)  Applicants for [employer] are first required to see if they are eligible to enroll in the [facility] Marketplace before enrolling in [facility] (and get an exemption if they are not).  GCCN Criteria for acceptance is:    Proof of Engineering geologist exemption - form or documentation    Valid photo ID (driver's license, state identification card, passport, home country ID)    Proof of [place] residency (e.g. drivers license, lease/landlord information, pay stubs with address, utility bill, bank statement, etc.)    Proof of income (1040, last year's tax return, W2, 4 current pay stubs, other income proof)    Proof of assets (current bank statement + 3 most recent, disability paperwork, life insurance info, tax value on autos, etc.)  3) Contact Your Local Health Department  Not all health departments have doctors that can see patients for sick visits, but many do, so it is worth a call to see if yours does. If you don't know where your local health department is, you can check in your phone book. The CDC also has a tool to help you locate your state's health department, and many state websites also have listings of all of their local health departments.  4) Find a Walk-in Clinic  If your illness is not likely to be very severe or complicated, you may want to try a walk in clinic. These are popping up all over the country in pharmacies, drugstores, and shopping centers. They're usually staffed by nurse practitioners or physician assistants that have been trained to treat common illnesses and complaints. They're usually fairly  quick and inexpensive. However, if you have serious medical issues or chronic medical problems, these are probably not your best option   STD Testing:  [facility] Department of [facility] [place], [facility] [facility]           [address], [place], phone [phone] or [phone]           Monday - Friday, call for an appointment          [facility] Department of [facility]Women), [facility] [facility]           [address], [place], phone [phone] or [phone]           Monday - Friday, call for an appointment Abuse/Neglect:           [facility] Child Abuse Hotline: [phone]           [facility] Child Abuse Hotline: [phone] (After Hours)  Emergency Shelter:  [facility] [phone]  [facility] [place]- [phone]  [facility] [place] - [phone]  Youth Focus - Act Together - [phone] (ages 46-17)  Homeless Day Shelter @ [employer] - [phone]   Mammograms - Free at [facility] [phone]  Maternity Homes:           Room at the [place] of the [facility]: [phone]   (Homeless mother with children)          [facility] Alert Services: [phone] (Mothers only)   Youth Focus: [phone] (Pregnant 67-41 years old)   Adopt a Mom -([phone]  [facility]    [facility] - Lanae Boast   [address], [place] [phone]          [facility] [place]           [address] [place]           [phone]          [place]           [address] [place], [place]           [phone]          [facility].           [address], [place]           [phone]          [facility] Mental Health           [phone]          [facility]           [phone]          [facility] in [place]            [address]           [phone], [facility] Child Abuse Hotline           (208) [phone]           [phone] (After Hours)  Behavioral Health Services /Substance Abuse Resources:           Alcohol and Drug Services: [phone]           [facility]: [phone]          [facility]: [phone]    Narcotics Helpline - 810-053-9349          [facility]: [phone]  Residential & Outpatient Substance Abuse Program - [facility]: [phone]   [employer]  [facility] - [phone] Psychological Services:          [facility]: [phone]    Therapeutic Alternatives: [phone]          [facility] Mental Health           [address], [place]           ACCESS LINE: [phone]     (24 Hour)   Mobile Crisis:    HELPLINES:  Financial risk analyst on Mental Illness - [place] [phone] [facility] on Mental Illness - [place] [phone]   Walk In [facility] - [address] - [place]  [phone]       [facility] - [phone] or [phone]  [facility] - [phone] - [employer] [phone]  [facility] [phone]. [address], [place] [phone]   Dental Assistance:  If unable to pay or uninsured, contact: [facility]. to become qualified for the adult dental clinic. Patient must be enrolled in [facility] (uninsured, 0-200% FPL, qualifying info).  Enroll in [facility] first, then see Primary Care Physician assigned to you, the PCP makes a dental referral. [facility] will receive referral and contacts patient for appointment.  Patients with Medicaid           [address], [phone]  [facility] (Children up to 20 + Pregnant Women) - [phone]  [facility] - [address] - [phone]  If unable to pay, or uninsured: contact [facility] Department [phone] in Advance - (Children only + Pregnant Women), [phone] in [facility]- Children only) to become qualified for the adult dental clinic  Must see if eligible to enroll in [facility] Marketplace before enrolling into the [facility] (exemption required) [phone] for an appointment)  [email or web address];   [phone].  If not eligible for ACA, then go by Department of Health and Human Services to see if eligible for orange card.  [address], [place] and [phone]  Once you get an orange card, you will have a Primary Care home who will then refer you to dental if needed.        Other IT consultant:   [facility] [phone] (ext [phone])   [address]  Dr. Lawrence Marseilles - [phone]   [address]    [place] - [phone]   [facility]           [address] [place], [place], [place], [address]           [phone], Ext. [phone]           2nd and 4th Thursday of the month at 6:30am (Simple extractions only - no wisdom teeth or surgery) First come/First serve -First 10 clients served           [facility] [place], [place] and [facility] residents only)          [address] [place], [place], [place], [address]           [phone]  [facility] Health Department           [phone]          [facility] Health Department          [phone]         [facility]          [phone]   Transportation Options:  Ambulance - 911 - [phone] per ride Family Member to accompany patient (if stable) [place] [employer] - [phone]  PART - [phone]  Taxi - [phone] - [place]  SCAT - [phone] (Application required)  [facility] - [phone]

## 2014-02-22 NOTE — MAU Note (Signed)
Patient states she has had lower back pain for "years" worse with periods. States this am she had intense back pain and could not move, a little less now. Has been having abdominal pain that comes and goes, worse with periods. On period at this time.

## 2014-02-22 NOTE — MAU Provider Note (Signed)
History     CSN: [id number]  Arrival date and time: 02/22/14 1506   First Provider Initiated Contact with Patient 02/22/14 1814      Chief Complaint  Patient presents with  . Abdominal Pain  . Back Pain   HPI [name] is a 33 y.o. [id number] who presents to MAU today with multiple complaints. The patient states LMP of 02/20/14. She states a history of regular periods and normal bleeding today. She also endorses a history of dysmenorrhea. She takes Aleve for pain and feels that it works most of the time. She also complains of back pain which she states has been on and off "for years." she also states that she was diagnosed with HTN and DM II in the ED ~ 3 months ago. She was given initial Rx for Metformin and Lisinopril at that time with 3 month supply. She states that she ran out of the those medications last week and has not found a PCP yet. She denies headache, blurred vision, numbness, tingling or weakness of her extremities or chest pain.   OB History   Grav Para Term Preterm Abortions TAB SAB Ect Mult Living   1    1  1          Past Medical History  Diagnosis Date  . Hypertension   . Diabetes mellitus without complication     Past Surgical History  Procedure Laterality Date  . No past surgeries      Family History  Problem Relation Age of Onset  . Diabetes Other     History  Substance Use Topics  . Smoking status: Current Every Day Smoker -- 1.00 packs/day for 15 years  . Smokeless tobacco: Not on file  . Alcohol Use: No    Allergies: No Known Allergies  No prescriptions prior to admission    Review of Systems  Constitutional: Negative for fever and malaise/fatigue.  Eyes: Negative for blurred vision.  Cardiovascular: Negative for chest pain.  Gastrointestinal: Positive for abdominal pain.  Musculoskeletal: Positive for back pain.  Neurological: Negative for dizziness, tingling, sensory change, focal weakness and headaches.   Physical Exam    Blood pressure 162/99, pulse 84, temperature 99.2 F (37.3 C), temperature source Oral, resp. rate 16, height 5\' 7"  (1.702 m), weight 202 lb (91.627 kg), last menstrual period 02/20/2014, SpO2 99.00%.  Physical Exam  Constitutional: She is oriented to person, place, and time. She appears well-developed and well-nourished. No distress.  HENT:  Head: Normocephalic and atraumatic.  Cardiovascular: Normal rate, regular rhythm and normal heart sounds.   Respiratory: Effort normal and breath sounds normal. No respiratory distress.  GI: Soft. Bowel sounds are normal. She exhibits no distension and no mass. There is no tenderness. There is no rebound and no guarding.  Genitourinary: Uterus is not enlarged and not tender. Cervix exhibits no motion tenderness, no discharge and no friability. Right adnexum displays no mass and no tenderness. Left adnexum displays no mass and no tenderness. There is bleeding (small amount bleeding in the vaginal vault) around the vagina. No vaginal discharge found.  Neurological: She is alert and oriented to person, place, and time.  Skin: Skin is warm and dry. No erythema.  Psychiatric: She has a normal mood and affect.   Results for orders placed during the hospital encounter of 02/22/14 (from the past 24 hour(s))  POCT PREGNANCY, URINE     Status: None   Collection Time    02/22/14  4:15 PM  Result Value Ref Range   Preg Test, Ur NEGATIVE  NEGATIVE  URINALYSIS, ROUTINE W REFLEX MICROSCOPIC     Status: Abnormal   Collection Time    02/22/14  5:30 PM      Result Value Ref Range   Color, Urine AMBER (*) YELLOW   APPearance CLOUDY (*) CLEAR   Specific Gravity, Urine >1.030 (*) 1.005 - 1.030   pH 6.0  5.0 - 8.0   Glucose, UA 500 (*) NEGATIVE mg/dL   Hgb urine dipstick LARGE (*) NEGATIVE   Bilirubin Urine NEGATIVE  NEGATIVE   Ketones, ur 15 (*) NEGATIVE mg/dL   Protein, ur 30 (*) NEGATIVE mg/dL   Urobilinogen, UA 0.2  0.0 - 1.0 mg/dL   Nitrite NEGATIVE   NEGATIVE   Leukocytes, UA NEGATIVE  NEGATIVE  URINE MICROSCOPIC-ADD ON     Status: Abnormal   Collection Time    02/22/14  5:30 PM      Result Value Ref Range   Squamous Epithelial / LPF FEW (*) RARE   RBC / HPF TOO NUMEROUS TO COUNT  <3 RBC/hpf   Urine-Other FIELD OBSCURED BY RBC'S    GLUCOSE, CAPILLARY     Status: Abnormal   Collection Time    02/22/14  6:18 PM      Result Value Ref Range   Glucose-Capillary 299 (*) 70 - 99 mg/dL  WET PREP, GENITAL     Status: Abnormal   Collection Time    02/22/14  6:30 PM      Result Value Ref Range   Yeast Wet Prep HPF POC NONE SEEN  NONE SEEN   Trich, Wet Prep NONE SEEN  NONE SEEN   Clue Cells Wet Prep HPF POC FEW (*) NONE SEEN   WBC, Wet Prep HPF POC FEW (*) NONE SEEN  COMPREHENSIVE METABOLIC PANEL     Status: Abnormal   Collection Time    02/22/14  7:20 PM      Result Value Ref Range   Sodium 136 (*) 137 - 147 mEq/L   Potassium 3.8  3.7 - 5.3 mEq/L   Chloride 98  96 - 112 mEq/L   CO2 27  19 - 32 mEq/L   Glucose, Bld 283 (*) 70 - 99 mg/dL   BUN 7  6 - 23 mg/dL   Creatinine, Ser 1.61  0.50 - 1.10 mg/dL   Calcium 9.3  8.4 - 09.6 mg/dL   Total Protein 7.0  6.0 - 8.3 g/dL   Albumin 3.6  3.5 - 5.2 g/dL   AST 11  0 - 37 U/L   ALT 11  0 - 35 U/L   Alkaline Phosphatase 89  39 - 117 U/L   Total Bilirubin <0.2 (*) 0.3 - 1.2 mg/dL   GFR calc non Af Amer >90  >90 mL/min   GFR calc Af Amer >90  >90 mL/min    MAU Course  Procedures None  MDM UPT - negative UA, wet prep and GC/Chlamydia today CBG - 299 - patient states that this is similar to her glucose readings at home. She only checks periodically.  Consulted ED physician Dr. Rolland Porter. He recommends checking kidney function prior to refill of medications and PCP follow-up CMP is essentially normal except for elevated serum glucose  Assessment and Plan  A: Dysmenorrhea Back pain, chronic HTN, uncontrolled DM II uncontrolled  P: Discharge home Rx for Lisinopril, Metformin  and Ibuprofen given to patient Patient given list of PCP in the area. Advised to  start care immediately Signs of worsening DM and HTN discussed. [facility] if symptoms arise Patient may return to MAU as needed or if her condition were to change or worsen  Freddi StarrJulie N Ethier, PA-C  02/22/2014, 8:43 PM

## 2014-02-22 NOTE — MAU Note (Signed)
Pt has ran out of BP medication, has not taken in a week.

## 2014-02-24 LAB — GC/CHLAMYDIA PROBE AMP
CT PROBE, AMP APTIMA: NEGATIVE
GC PROBE AMP APTIMA: NEGATIVE

## 2014-05-22 ENCOUNTER — Ambulatory Visit: Payer: Self-pay | Admitting: Internal Medicine

## 2014-05-30 ENCOUNTER — Ambulatory Visit: Payer: Self-pay | Attending: Internal Medicine | Admitting: Internal Medicine

## 2014-05-30 ENCOUNTER — Encounter: Payer: Self-pay | Admitting: Internal Medicine

## 2014-05-30 VITALS — BP 126/85 | HR 105 | Temp 99.5°F | Resp 16 | Wt 204.2 lb

## 2014-05-30 DIAGNOSIS — IMO0001 Reserved for inherently not codable concepts without codable children: Secondary | ICD-10-CM | POA: Insufficient documentation

## 2014-05-30 DIAGNOSIS — E139 Other specified diabetes mellitus without complications: Secondary | ICD-10-CM

## 2014-05-30 DIAGNOSIS — F17200 Nicotine dependence, unspecified, uncomplicated: Secondary | ICD-10-CM | POA: Insufficient documentation

## 2014-05-30 DIAGNOSIS — E089 Diabetes mellitus due to underlying condition without complications: Secondary | ICD-10-CM | POA: Insufficient documentation

## 2014-05-30 DIAGNOSIS — Z139 Encounter for screening, unspecified: Secondary | ICD-10-CM

## 2014-05-30 DIAGNOSIS — E1165 Type 2 diabetes mellitus with hyperglycemia: Secondary | ICD-10-CM

## 2014-05-30 DIAGNOSIS — Z0000 Encounter for general adult medical examination without abnormal findings: Secondary | ICD-10-CM | POA: Insufficient documentation

## 2014-05-30 DIAGNOSIS — I10 Essential (primary) hypertension: Secondary | ICD-10-CM | POA: Insufficient documentation

## 2014-05-30 LAB — CBC WITH DIFFERENTIAL/PLATELET
Basophils Absolute: 0 10*3/uL (ref 0.0–0.1)
Basophils Relative: 0 % (ref 0–1)
EOS ABS: 0.1 10*3/uL (ref 0.0–0.7)
Eosinophils Relative: 1 % (ref 0–5)
HCT: 42.5 % (ref 36.0–46.0)
Hemoglobin: 14.2 g/dL (ref 12.0–15.0)
LYMPHS ABS: 4.3 10*3/uL — AB (ref 0.7–4.0)
Lymphocytes Relative: 40 % (ref 12–46)
MCH: 29.2 pg (ref 26.0–34.0)
MCHC: 33.4 g/dL (ref 30.0–36.0)
MCV: 87.3 fL (ref 78.0–100.0)
Monocytes Absolute: 0.6 10*3/uL (ref 0.1–1.0)
Monocytes Relative: 6 % (ref 3–12)
NEUTROS PCT: 53 % (ref 43–77)
Neutro Abs: 5.7 10*3/uL (ref 1.7–7.7)
Platelets: 234 10*3/uL (ref 150–400)
RBC: 4.87 MIL/uL (ref 3.87–5.11)
RDW: 14.5 % (ref 11.5–15.5)
WBC: 10.7 10*3/uL — ABNORMAL HIGH (ref 4.0–10.5)

## 2014-05-30 LAB — POCT GLYCOSYLATED HEMOGLOBIN (HGB A1C): HEMOGLOBIN A1C: 11.6

## 2014-05-30 LAB — GLUCOSE, POCT (MANUAL RESULT ENTRY): POC Glucose: 220 mg/dl — AB (ref 70–99)

## 2014-05-30 MED ORDER — GLIPIZIDE 2.5 MG HALF TABLET: 2.5000 mg | ORAL_TABLET | Freq: Two times a day (BID) | ORAL | Status: DC

## 2014-05-30 MED ORDER — LISINOPRIL 20 MG PO TABS: 10.0000 mg | ORAL_TABLET | Freq: Every day | ORAL | Status: DC

## 2014-05-30 MED ORDER — METFORMIN HCL 1000 MG PO TABS
1000.0000 mg | ORAL_TABLET | Freq: Two times a day (BID) | ORAL | Status: DC
Start: 2014-05-30 — End: 2018-02-13

## 2014-05-30 MED ORDER — VARENICLINE TARTRATE 0.5 MG X 11 & 1 MG X 42 PO MISC: ORAL | Status: DC

## 2014-05-30 MED ORDER — FREESTYLE SYSTEM KIT: 1.0000 | PACK | Freq: Three times a day (TID) | Status: DC

## 2014-05-30 NOTE — Patient Instructions (Signed)
Diabetes Mellitus and Food It is important for you to manage your blood sugar (glucose) level. Your blood glucose level can be greatly affected by what you eat. Eating healthier foods in the appropriate amounts throughout the day at about the same time each day will help you control your blood glucose level. It can also help slow or prevent worsening of your diabetes mellitus. Healthy eating may even help you improve the level of your blood pressure and reach or maintain a healthy weight.  HOW CAN FOOD AFFECT ME? Carbohydrates Carbohydrates affect your blood glucose level more than any other type of food. Your dietitian will help you determine how many carbohydrates to eat at each meal and teach you how to count carbohydrates. Counting carbohydrates is important to keep your blood glucose at a healthy level, especially if you are using insulin or taking certain medicines for diabetes mellitus. Alcohol Alcohol can cause sudden decreases in blood glucose (hypoglycemia), especially if you use insulin or take certain medicines for diabetes mellitus. Hypoglycemia can be a life-threatening condition. Symptoms of hypoglycemia (sleepiness, dizziness, and disorientation) are similar to symptoms of having too much alcohol.  If your health care provider has given you approval to drink alcohol, do so in moderation and use the following guidelines:  Women should not have more than one drink per day, and men should not have more than two drinks per day. One drink is equal to:  12 oz of beer.  5 oz of wine.  1 oz of hard liquor.  Do not drink on an empty stomach.  Keep yourself hydrated. Have water, diet soda, or unsweetened iced tea.  Regular soda, juice, and other mixers might contain a lot of carbohydrates and should be counted. WHAT FOODS ARE NOT RECOMMENDED? As you make food choices, it is important to remember that all foods are not the same. Some foods have fewer nutrients per serving than other  foods, even though they might have the same number of calories or carbohydrates. It is difficult to get your body what it needs when you eat foods with fewer nutrients. Examples of foods that you should avoid that are high in calories and carbohydrates but low in nutrients include:  Trans fats (most processed foods list trans fats on the Nutrition Facts label).  Regular soda.  Juice.  Candy.  Sweets, such as cake, pie, doughnuts, and cookies.  Fried foods. WHAT FOODS CAN I EAT? Have nutrient-rich foods, which will nourish your body and keep you healthy. The food you should eat also will depend on several factors, including:  The calories you need.  The medicines you take.  Your weight.  Your blood glucose level.  Your blood pressure level.  Your cholesterol level. You also should eat a variety of foods, including:  Protein, such as meat, poultry, fish, tofu, nuts, and seeds (lean animal proteins are best).  Fruits.  Vegetables.  Dairy products, such as milk, cheese, and yogurt (low fat is best).  Breads, grains, pasta, cereal, rice, and beans.  Fats such as olive oil, trans fat-free margarine, canola oil, avocado, and olives. DOES EVERYONE WITH DIABETES MELLITUS HAVE THE SAME MEAL PLAN? Because every person with diabetes mellitus is different, there is not one meal plan that works for everyone. It is very important that you meet with a dietitian who will help you create a meal plan that is just right for you. Document Released: 06/12/2005 Document Revised: 09/20/2013 Document Reviewed: 08/12/2013 [employer] Patient Information 2015 [employer]. This   information is not intended to replace advice given to you by your health care provider. Make sure you discuss any questions you have with your health care provider.  

## 2014-05-30 NOTE — Progress Notes (Signed)
Patient Demographics  Simisola Sandles, is a 33 y.o. female  TOI:[id number]  IPJ:[id number]  DOB - 04-17-1981  CC:  Chief Complaint  Patient presents with  . Establish Care       HPI: Starlet Gallentine is a 33 y.o. female here today to establish medical care. Patient has history of diabetes for 3 years as well as hypertension, she takes metformin thousand milligram twice a day as well as lisinopril 20 mg daily, patient does not check her blood sugar at home her A1c is 11.6%, she does report family history of diabetes as well, currently denies any acute symptoms denies any headache dizziness chest pain  shortness of breath, patient smokes cigarettes, I advised patient to quit smoking. Patient has No headache, No chest pain, No abdominal pain - No Nausea, No new weakness tingling or numbness, No Cough - SOB.  No Known Allergies Past Medical History  Diagnosis Date  . Hypertension   . Diabetes mellitus without complication    Current Outpatient Prescriptions on File Prior to Visit  Medication Sig Dispense Refill  . ibuprofen (ADVIL,MOTRIN) 600 MG tablet Take 1 tablet (600 mg total) by mouth every 6 (six) hours as needed.  30 tablet  0   No current facility-administered medications on file prior to visit.   Family History  Problem Relation Age of Onset  . Diabetes Other   . Hypertension Maternal Grandmother    History   Social History  . Marital Status: Single    Spouse Name: N/A    Number of Children: N/A  . Years of Education: N/A   Occupational History  . Not on file.   Social History Main Topics  . Smoking status: Current Every Day Smoker -- 1.00 packs/day for 15 years  . Smokeless tobacco: Not on file  . Alcohol Use: No  . Drug Use: Yes    Special: Marijuana     Comment: occasional  . Sexual Activity: Yes    Birth Control/ Protection: None   Other Topics Concern  . Not on file   Social History Narrative  . No narrative on file    Review of  Systems: Constitutional: Negative for fever, chills, diaphoresis, activity change, appetite change and fatigue. HENT: Negative for ear pain, nosebleeds, congestion, facial swelling, rhinorrhea, neck pain, neck stiffness and ear discharge.  Eyes: Negative for pain, discharge, redness, itching and visual disturbance. Respiratory: Negative for cough, choking, chest tightness, shortness of breath, wheezing and stridor.  Cardiovascular: Negative for chest pain, palpitations and leg swelling. Gastrointestinal: Negative for abdominal distention. Genitourinary: Negative for dysuria, urgency, frequency, hematuria, flank pain, decreased urine volume, difficulty urinating and dyspareunia.  Musculoskeletal: Negative for back pain, joint swelling, arthralgia and gait problem. Neurological: Negative for dizziness, tremors, seizures, syncope, facial asymmetry, speech difficulty, weakness, light-headedness, numbness and headaches.  Hematological: Negative for adenopathy. Does not bruise/bleed easily. Psychiatric/Behavioral: Negative for hallucinations, behavioral problems, confusion, dysphoric mood, decreased concentration and agitation.    Objective:   Filed Vitals:   05/30/14 1505  BP: 126/85  Pulse: 105  Temp: 99.5 F (37.5 C)  Resp: 16    Physical Exam: Constitutional: Patient appears well-developed and well-nourished. No distress. HENT: Normocephalic, atraumatic, External right and left ear normal. Oropharynx is clear and moist.  Eyes: Conjunctivae and EOM are normal. PERRLA, no scleral icterus. Neck: Normal ROM. Neck supple. No JVD. No tracheal deviation. No thyromegaly. CVS: RRR, S1/S2 +, no murmurs, no gallops, no carotid bruit.  Pulmonary: Effort and breath sounds  normal, no stridor, rhonchi, wheezes, rales.  Abdominal: Soft. BS +, no distension, tenderness, rebound or guarding.  Musculoskeletal: Normal range of motion. No edema and no tenderness.  Neuro: Alert. Normal reflexes, muscle  tone coordination. No cranial nerve deficit. Skin: Skin is warm and dry. No rash noted. Not diaphoretic. No erythema. No pallor. Psychiatric: Normal mood and affect. Behavior, judgment, thought content normal.  Lab Results  Component Value Date   WBC 10.7* 11/14/2013   HGB 15.3* 11/14/2013   HCT 44.2 11/14/2013   MCV 86.7 11/14/2013   PLT 159 11/14/2013   Lab Results  Component Value Date   CREATININE 0.70 02/22/2014   BUN 7 02/22/2014   NA 136* 02/22/2014   K 3.8 02/22/2014   CL 98 02/22/2014   CO2 27 02/22/2014    Lab Results  Component Value Date   HGBA1C 11.6 05/30/2014   Lipid Panel     Component Value Date/Time   CHOL 213* 10/16/2010 2056   TRIG 104 10/16/2010 2056   HDL 33* 10/16/2010 2056   CHOLHDL 6.5 Ratio 10/16/2010 2056   VLDL 21 10/16/2010 2056   LDLCALC 159* 10/16/2010 2056       Assessment and plan:   1. Diabetes mellitus due to underlying condition without complications Results for orders placed in visit on 05/30/14  GLUCOSE, POCT (MANUAL RESULT ENTRY)      Result Value Ref Range   POC Glucose 220 (*) 70 - 99 mg/dl  POCT GLYCOSYLATED HEMOGLOBIN (HGB A1C)      Result Value Ref Range   Hemoglobin A1C 11.6      Diabetes is uncontrolled, I have advised patient for diabetes planning, she'll continue with metformin, I have added Glucotrol 2.5 mg twice a day. - glucose monitoring kit (FREESTYLE) monitoring kit; 1 each by Does not apply route 4 (four) times daily - after meals and at bedtime. 1 month Diabetic Testing Supplies for QAC-QHS accuchecks.  Dispense: 1 each; Refill: 1 - metFORMIN (GLUCOPHAGE) 1000 MG tablet; Take 1 tablet (1,000 mg total) by mouth 2 (two) times daily.  Dispense: 60 tablet; Refill: 3 - glipiZIDE (GLUCOTROL) 2.5 mg TABS tablet; Take 0.5 tablets (2.5 mg total) by mouth 2 (two) times daily before a meal.  Dispense: 60 tablet; Refill: 3  2. Essential hypertension, benign Advise for DASH diet continue with - lisinopril (PRINIVIL,ZESTRIL) 20 MG  tablet; Take 0.5 tablets (10 mg total) by mouth daily.  Dispense: 30 tablet; Refill: 3  3. Smoking I have started patient on Chantix, she denies any previous history of anxiety or depression. - varenicline (CHANTIX STARTING MONTH PAK) 0.5 MG X 11 & 1 MG X 42 tablet; Take one 0.5 mg tablet by mouth once daily for 3 days, then increase to one 0.5 mg tablet twice daily for 4 days, then increase to one 1 mg tablet twice daily.  Dispense: 53 tablet; Refill: 0  4. Screening Ordered baseline blood work. - CBC with Differential - COMPLETE METABOLIC PANEL WITH GFR - TSH - Lipid panel - Vit D  25 hydroxy (rtn osteoporosis monitoring)   Return in about 3 months (around 08/29/2014) for diabetes, hypertension.   Lorayne Marek, MD

## 2014-05-30 NOTE — Progress Notes (Signed)
Patient here to establish care Takes medication for DM and HTN

## 2014-05-31 LAB — VITAMIN D 25 HYDROXY (VIT D DEFICIENCY, FRACTURES): Vit D, 25-Hydroxy: 22 ng/mL — ABNORMAL LOW (ref 30–89)

## 2014-05-31 LAB — LIPID PANEL
Cholesterol: 258 mg/dL — ABNORMAL HIGH (ref 0–200)
HDL: 37 mg/dL — ABNORMAL LOW (ref >39)
LDL CALC: 182 mg/dL — AB (ref 0–99)
Total CHOL/HDL Ratio: 7 Ratio
Triglycerides: 196 mg/dL — ABNORMAL HIGH (ref <150)
VLDL: 39 mg/dL (ref 0–40)

## 2014-05-31 LAB — COMPLETE METABOLIC PANEL WITH GFR
ALT: 10 U/L (ref 0–35)
AST: 12 U/L (ref 0–37)
Albumin: 4.5 g/dL (ref 3.5–5.2)
Alkaline Phosphatase: 79 U/L (ref 39–117)
BILIRUBIN TOTAL: 0.4 mg/dL (ref 0.2–1.2)
BUN: 9 mg/dL (ref 6–23)
CO2: 27 meq/L (ref 19–32)
Calcium: 10.1 mg/dL (ref 8.4–10.5)
Chloride: 99 mEq/L (ref 96–112)
Creat: 0.82 mg/dL (ref 0.50–1.10)
GFR, Est African American: >89 mL/min
GFR, Est Non African American: >89 mL/min
Glucose, Bld: 219 mg/dL — ABNORMAL HIGH (ref 70–99)
POTASSIUM: 4.6 meq/L (ref 3.5–5.3)
Sodium: 135 mEq/L (ref 135–145)
Total Protein: 7.4 g/dL (ref 6.0–8.3)

## 2014-05-31 LAB — TSH: TSH: 0.947 u[IU]/mL (ref 0.350–4.500)

## 2014-06-01 ENCOUNTER — Telehealth: Payer: Self-pay | Admitting: Emergency Medicine

## 2014-06-01 MED ORDER — VITAMIN D (ERGOCALCIFEROL) 1.25 MG (50000 UNIT) PO CAPS: 50000.0000 [IU] | ORAL_CAPSULE | ORAL | Status: DC

## 2014-06-01 MED ORDER — ATORVASTATIN CALCIUM 20 MG PO TABS: 20.0000 mg | ORAL_TABLET | Freq: Every day | ORAL | Status: DC

## 2014-06-01 NOTE — Telephone Encounter (Signed)
Attempted to reach pt with lab results. The number listed is unavailable  Medications ordered and e-scribed to pharmacy

## 2014-06-01 NOTE — Telephone Encounter (Signed)
Message copied by Darlis Loan on Thu Jun 01, 2014  3:46 PM ------      Message from: Doris Cheadle      Created: Wed May 31, 2014  9:25 AM       Blood work reviewed, noticed low vitamin D, call patient advise to start ergocalciferol 50,000 units once a week for the duration of  12 weeks.      Also noticed hyperlipidemia, since patient is diabetic will start on statins, advise patient to start taking Lipitor 20 mg daily.       ------

## 2014-06-01 NOTE — Telephone Encounter (Signed)
Message copied by Darlis Loan on Thu Jun 01, 2014  3:49 PM ------      Message from: Doris Cheadle      Created: Wed May 31, 2014  9:25 AM       Blood work reviewed, noticed low vitamin D, call patient advise to start ergocalciferol 50,000 units once a week for the duration of  12 weeks.      Also noticed hyperlipidemia, since patient is diabetic will start on statins, advise patient to start taking Lipitor 20 mg daily.       ------

## 2014-07-31 ENCOUNTER — Encounter: Payer: Self-pay | Admitting: Internal Medicine

## 2014-12-28 ENCOUNTER — Emergency Department (HOSPITAL_COMMUNITY)
Admission: EM | Admit: 2014-12-28 | Discharge: 2014-12-28 | Disposition: A | Discharge status: Home / Self Care | Payer: 59 | Attending: Emergency Medicine | Admitting: Emergency Medicine

## 2014-12-28 ENCOUNTER — Encounter (HOSPITAL_COMMUNITY): Payer: Self-pay | Admitting: Emergency Medicine

## 2014-12-28 DIAGNOSIS — R000 Tachycardia, unspecified: Secondary | ICD-10-CM | POA: Diagnosis not present

## 2014-12-28 DIAGNOSIS — Z720 Tobacco use: Secondary | ICD-10-CM | POA: Diagnosis not present

## 2014-12-28 DIAGNOSIS — L02415 Cutaneous abscess of right lower limb: Secondary | ICD-10-CM | POA: Insufficient documentation

## 2014-12-28 DIAGNOSIS — Z79899 Other long term (current) drug therapy: Secondary | ICD-10-CM | POA: Insufficient documentation

## 2014-12-28 DIAGNOSIS — E119 Type 2 diabetes mellitus without complications: Secondary | ICD-10-CM | POA: Insufficient documentation

## 2014-12-28 DIAGNOSIS — I10 Essential (primary) hypertension: Secondary | ICD-10-CM | POA: Insufficient documentation

## 2014-12-28 DIAGNOSIS — L0291 Cutaneous abscess, unspecified: Secondary | ICD-10-CM

## 2014-12-28 MED ORDER — CEPHALEXIN 500 MG PO CAPS: 500.0000 mg | ORAL_CAPSULE | Freq: Four times a day (QID) | ORAL | Status: DC

## 2014-12-28 MED ORDER — LIDOCAINE-EPINEPHRINE (PF) 2 %-1:200000 IJ SOLN
10.0000 mL | Freq: Once | INTRAMUSCULAR | Status: AC
  Administered 2014-12-28: 10 mL
  Filled 2014-12-28: qty 10

## 2014-12-28 MED ORDER — HYDROCODONE-ACETAMINOPHEN 5-325 MG PO TABS: 1.0000 | ORAL_TABLET | Freq: Four times a day (QID) | ORAL | Status: DC | PRN

## 2014-12-28 NOTE — ED Provider Notes (Signed)
CSN: [id number]     Arrival date & time 12/28/14  1502 History  This chart was scribed for non-physician practitioner, Montine Circle, working with Daleen Bo, MD by Molli Posey, ED Scribe. This patient was seen in room WTR7/WTR7 and the patient's care was started at 3:28 PM.   Chief Complaint  Patient presents with  . Abscess    inner aspect r/thigh x 2 days   HPI HPI Comments: Ebony Matthews is a 34 y.o. female with a history of HTN and DM who presents to the Emergency Department complaining of an abscess on her right inner thigh for the last 3 days. She reports associated swelling and pain and rates it as a 4/10 at this time. Pt reports a history of abscess and reports she had one drained in the past. Pt reports NKDA. She denies any drainage, fever, chills, nausea and vomiting.   Past Medical History  Diagnosis Date  . Hypertension   . Diabetes mellitus without complication    Past Surgical History  Procedure Laterality Date  . No past surgeries     Family History  Problem Relation Age of Onset  . Diabetes Other   . Hypertension Maternal Grandmother    History  Substance Use Topics  . Smoking status: Current Every Day Smoker -- 1.00 packs/day for 15 years  . Smokeless tobacco: Not on file  . Alcohol Use: No   OB History    Gravida Para Term Preterm AB TAB SAB Ectopic Multiple Living   '1    1  1        ' Review of Systems  Constitutional: Negative for fever and chills.  Gastrointestinal: Negative for nausea and vomiting.  Skin: Positive for rash.      Allergies  Review of patient's allergies indicates no known allergies.  Home Medications   Prior to Admission medications   Medication Sig Start Date End Date Taking? Authorizing Provider  glipiZIDE (GLUCOTROL) 2.5 mg TABS tablet Take 0.5 tablets (2.5 mg total) by mouth 2 (two) times daily before a meal. 05/30/14  Yes Deepak Advani, MD  glucose monitoring kit (FREESTYLE) monitoring kit 1 each by Does not apply  route 4 (four) times daily - after meals and at bedtime. 1 month Diabetic Testing Supplies for QAC-QHS accuchecks. 05/30/14  Yes Lorayne Marek, MD  lisinopril (PRINIVIL,ZESTRIL) 20 MG tablet Take 0.5 tablets (10 mg total) by mouth daily. 05/30/14  Yes Lorayne Marek, MD  metFORMIN (GLUCOPHAGE) 1000 MG tablet Take 1 tablet (1,000 mg total) by mouth 2 (two) times daily. 05/30/14  Yes Lorayne Marek, MD  naproxen sodium (ANAPROX) 220 MG tablet Take 440 mg by mouth 2 (two) times daily as needed (back pain).   Yes Historical Provider, MD  atorvastatin (LIPITOR) 20 MG tablet Take 1 tablet (20 mg total) by mouth daily. Patient not taking: Reported on 12/28/2014 06/01/14   Lorayne Marek, MD  ibuprofen (ADVIL,MOTRIN) 600 MG tablet Take 1 tablet (600 mg total) by mouth every 6 (six) hours as needed. Patient not taking: Reported on 12/28/2014 02/22/14   Luvenia Redden, PA-C  varenicline (CHANTIX STARTING MONTH PAK) 0.5 MG X 11 & 1 MG X 42 tablet Take one 0.5 mg tablet by mouth once daily for 3 days, then increase to one 0.5 mg tablet twice daily for 4 days, then increase to one 1 mg tablet twice daily. Patient not taking: Reported on 12/28/2014 05/30/14   Lorayne Marek, MD  Vitamin D, Ergocalciferol, (DRISDOL) 50000 UNITS CAPS capsule Take 1  capsule (50,000 Units total) by mouth every 7 (seven) days. Patient not taking: Reported on 12/28/2014 06/01/14   Lorayne Marek, MD   BP 141/85 mmHg  Pulse 105  Temp(Src) 99 F (37.2 C) (Oral)  Resp 18  Wt 200 lb (90.719 kg)  SpO2 100%  LMP 11/30/2014 (Approximate) Physical Exam  Constitutional: She is oriented to person, place, and time. She appears well-developed and well-nourished.  HENT:  Head: Normocephalic and atraumatic.  Eyes: Right eye exhibits no discharge. Left eye exhibits no discharge.  Neck: Neck supple. No tracheal deviation present.  Cardiovascular:  Tachycardia (mild)  Pulmonary/Chest: Effort normal. No respiratory distress.  Abdominal: She exhibits no  distension.  Neurological: She is alert and oriented to person, place, and time.  Skin: Skin is warm and dry.  2x3 cm abscess to right inner thigh, mild surrounding soft tissue changes  Psychiatric: She has a normal mood and affect. Her behavior is normal.  Nursing note and vitals reviewed.   ED Course  Procedures   DIAGNOSTIC STUDIES: Oxygen Saturation is 100% on RA, normal by my interpretation.    COORDINATION OF CARE: 3:30 PM Discussed treatment plan with pt at bedside and pt agreed to plan.  INCISION AND DRAINAGE Performed by: Montine Circle Consent: Verbal consent obtained. Risks and benefits: risks, benefits and alternatives were discussed Type: abscess  Body area: Right inner thigh  Anesthesia: local infiltration  Incision was made with a scalpel.  Local anesthetic: lidocaine 2% with epinephrine  Anesthetic total: 5 ml  Complexity: complex Blunt dissection to break up loculations  Drainage: purulent  Drainage amount: moderate  Packing material: 1/4 in iodoform gauze  Patient tolerance: Patient tolerated the procedure well with no immediate complications.     Labs Review Labs Reviewed - No data to display  Imaging Review No results found.   EKG Interpretation None      MDM   Final diagnoses:  Abscess   Patient with skin abscess amenable to incision and drainage.  Wound recheck in 2 days if symptoms worsen. Encouraged home warm soaks and flushing.  Mild signs of cellulitis is surrounding skin.  Will d/c to home.    I personally performed the services described in this documentation, which was scribed in my presence. The recorded information has been reviewed and is accurate.       Montine Circle, PA-C 12/28/14 Malin, MD 12/28/14 [phone]

## 2014-12-28 NOTE — ED Notes (Signed)
Pt reports swollen area on r/inner aspect of r/thigh x 2 days

## 2014-12-28 NOTE — Discharge Instructions (Signed)

## 2015-01-02 ENCOUNTER — Emergency Department (HOSPITAL_COMMUNITY)
Admission: EM | Admit: 2015-01-02 | Discharge: 2015-01-02 | Disposition: A | Discharge status: Home / Self Care | Payer: 59 | Attending: Emergency Medicine | Admitting: Emergency Medicine

## 2015-01-02 ENCOUNTER — Encounter (HOSPITAL_COMMUNITY): Payer: Self-pay

## 2015-01-02 DIAGNOSIS — E1165 Type 2 diabetes mellitus with hyperglycemia: Secondary | ICD-10-CM | POA: Insufficient documentation

## 2015-01-02 DIAGNOSIS — Z79899 Other long term (current) drug therapy: Secondary | ICD-10-CM | POA: Insufficient documentation

## 2015-01-02 DIAGNOSIS — L0291 Cutaneous abscess, unspecified: Secondary | ICD-10-CM

## 2015-01-02 DIAGNOSIS — L02415 Cutaneous abscess of right lower limb: Secondary | ICD-10-CM | POA: Insufficient documentation

## 2015-01-02 DIAGNOSIS — R739 Hyperglycemia, unspecified: Secondary | ICD-10-CM

## 2015-01-02 DIAGNOSIS — I10 Essential (primary) hypertension: Secondary | ICD-10-CM | POA: Diagnosis not present

## 2015-01-02 DIAGNOSIS — Z720 Tobacco use: Secondary | ICD-10-CM | POA: Insufficient documentation

## 2015-01-02 DIAGNOSIS — Z791 Long term (current) use of non-steroidal anti-inflammatories (NSAID): Secondary | ICD-10-CM | POA: Diagnosis not present

## 2015-01-02 DIAGNOSIS — Z792 Long term (current) use of antibiotics: Secondary | ICD-10-CM | POA: Insufficient documentation

## 2015-01-02 LAB — CBC
HCT: 41.5 % (ref 36.0–46.0)
HEMOGLOBIN: 13.8 g/dL (ref 12.0–15.0)
MCH: 29 pg (ref 26.0–34.0)
MCHC: 33.3 g/dL (ref 30.0–36.0)
MCV: 87.2 fL (ref 78.0–100.0)
PLATELETS: 241 10*3/uL (ref 150–400)
RBC: 4.76 MIL/uL (ref 3.87–5.11)
RDW: 14.2 % (ref 11.5–15.5)
WBC: 13.8 10*3/uL — ABNORMAL HIGH (ref 4.0–10.5)

## 2015-01-02 LAB — BASIC METABOLIC PANEL
Anion gap: 10 (ref 5–15)
BUN: 7 mg/dL (ref 6–23)
CO2: 25 mmol/L (ref 19–32)
CREATININE: 0.71 mg/dL (ref 0.50–1.10)
Calcium: 9.2 mg/dL (ref 8.4–10.5)
Chloride: 99 mmol/L (ref 96–112)
GLUCOSE: 373 mg/dL — AB (ref 70–99)
Potassium: 3.7 mmol/L (ref 3.5–5.1)
Sodium: 134 mmol/L — ABNORMAL LOW (ref 135–145)

## 2015-01-02 LAB — CBG MONITORING, ED
Glucose-Capillary: 328 mg/dL — ABNORMAL HIGH (ref 70–99)
Glucose-Capillary: 316 mg/dL — ABNORMAL HIGH (ref 70–99)

## 2015-01-02 MED ORDER — LIDOCAINE-EPINEPHRINE 2 %-1:100000 IJ SOLN
30.0000 mL | Freq: Once | INTRAMUSCULAR | Status: AC
  Administered 2015-01-02: 30 mL via INTRADERMAL
  Filled 2015-01-02: qty 2

## 2015-01-02 MED ORDER — LIDOCAINE HCL (PF) 1 % IJ SOLN: 30.0000 mL | Freq: Once | INTRAMUSCULAR | Status: DC

## 2015-01-02 MED ORDER — SODIUM CHLORIDE 0.9 % IV BOLUS (SEPSIS)
1000.0000 mL | INTRAVENOUS | Status: AC
  Administered 2015-01-02: 1000 mL via INTRAVENOUS

## 2015-01-02 NOTE — ED Notes (Signed)
CBG was 328. Did not upload from the dock.

## 2015-01-02 NOTE — ED Notes (Signed)
Patient c/o abscess to right posterior upper thigh.

## 2015-01-02 NOTE — Discharge Instructions (Signed)
Abscess An abscess is an infected area that contains a collection of pus and debris.It can occur in almost any part of the body. An abscess is also known as a furuncle or boil. CAUSES  An abscess occurs when tissue gets infected. This can occur from blockage of oil or sweat glands, infection of hair follicles, or a minor injury to the skin. As the body tries to fight the infection, pus collects in the area and creates pressure under the skin. This pressure causes pain. People with weakened immune systems have difficulty fighting infections and get certain abscesses more often.  SYMPTOMS Usually an abscess develops on the skin and becomes a painful mass that is red, warm, and tender. If the abscess forms under the skin, you may feel a moveable soft area under the skin. Some abscesses break open (rupture) on their own, but most will continue to get worse without care. The infection can spread deeper into the body and eventually into the bloodstream, causing you to feel ill.  DIAGNOSIS  Your caregiver will take your medical history and perform a physical exam. A sample of fluid may also be taken from the abscess to determine what is causing your infection. TREATMENT  Your caregiver may prescribe antibiotic medicines to fight the infection. However, taking antibiotics alone usually does not cure an abscess. Your caregiver may need to make a small cut (incision) in the abscess to drain the pus. In some cases, gauze is packed into the abscess to reduce pain and to continue draining the area. HOME CARE INSTRUCTIONS   Only take over-the-counter or prescription medicines for pain, discomfort, or fever as directed by your caregiver.  If you were prescribed antibiotics, take them as directed. Finish them even if you start to feel better.  If gauze is used, follow your caregiver's directions for changing the gauze.  To avoid spreading the infection:  Keep your draining abscess covered with a  bandage.  Wash your hands well.  Do not share personal care items, towels, or whirlpools with others.  Avoid skin contact with others.  Keep your skin and clothes clean around the abscess.  Keep all follow-up appointments as directed by your caregiver. SEEK MEDICAL CARE IF:   You have increased pain, swelling, redness, fluid drainage, or bleeding.  You have muscle aches, chills, or a general ill feeling.  You have a fever. MAKE SURE YOU:   Understand these instructions.  Will watch your condition.  Will get help right away if you are not doing well or get worse. Document Released: 06/25/2005 Document Revised: 03/16/2012 Document Reviewed: 11/28/2011 [facility] Patient Information 2015 [place], [place]. This information is not intended to replace advice given to you by your health care provider. Make sure you discuss any questions you have with your health care provider.  Blood Glucose Monitoring Monitoring your blood glucose (also know as blood sugar) helps you to manage your diabetes. It also helps you and your health care provider monitor your diabetes and determine how well your treatment plan is working. WHY SHOULD YOU MONITOR YOUR BLOOD GLUCOSE?  It can help you understand how food, exercise, and medicine affect your blood glucose.  It allows you to know what your blood glucose is at any given moment. You can quickly tell if you are having low blood glucose (hypoglycemia) or high blood glucose (hyperglycemia).  It can help you and your health care provider know how to adjust your medicines.  It can help you understand how to manage an  illness or adjust medicine for exercise. WHEN SHOULD YOU TEST? Your health care provider will help you decide how often you should check your blood glucose. This may depend on the type of diabetes you have, your diabetes control, or the types of medicines you are taking. Be sure to write down all of your blood glucose readings so that this  information can be reviewed with your health care provider. See below for examples of testing times that your health care provider may suggest. Type 1 Diabetes  Test 4 times a day if you are in good control, using an insulin pump, or perform multiple daily injections.  If your diabetes is not well controlled or if you are sick, you may need to monitor more often.  It is a good idea to also monitor:  Before and after exercise.  Between meals and 2 hours after a meal.  Occasionally between 2:00 a.m. and 3:00 a.m. Type 2 Diabetes  It can vary with each person, but generally, if you are on insulin, test 4 times a day.  If you take medicines by mouth (orally), test 2 times a day.  If you are on a controlled diet, test once a day.  If your diabetes is not well controlled or if you are sick, you may need to monitor more often. HOW TO MONITOR YOUR BLOOD GLUCOSE Supplies Needed  Blood glucose meter.  Test strips for your meter. Each meter has its own strips. You must use the strips that go with your own meter.  A pricking needle (lancet).  A device that holds the lancet (lancing device).  A journal or log book to write down your results. Procedure  Wash your hands with soap and water. Alcohol is not preferred.  Prick the side of your finger (not the tip) with the lancet.  Gently milk the finger until a small drop of blood appears.  Follow the instructions that come with your meter for inserting the test strip, applying blood to the strip, and using your blood glucose meter. Other Areas to Get Blood for Testing Some meters allow you to use other areas of your body (other than your finger) to test your blood. These areas are called alternative sites. The most common alternative sites are:  The forearm.  The thigh.  The back area of the lower leg.  The palm of the hand. The blood flow in these areas is slower. Therefore, the blood glucose values you get may be delayed, and  the numbers are different from what you would get from your fingers. Do not use alternative sites if you think you are having hypoglycemia. Your reading will not be accurate. Always use a finger if you are having hypoglycemia. Also, if you cannot feel your lows (hypoglycemia unawareness), always use your fingers for your blood glucose checks. ADDITIONAL TIPS FOR GLUCOSE MONITORING  Do not reuse lancets.  Always carry your supplies with you.  All blood glucose meters have a 24-hour "hotline" number to call if you have questions or need help.  Adjust (calibrate) your blood glucose meter with a control solution after finishing a few boxes of strips. BLOOD GLUCOSE RECORD KEEPING It is a good idea to keep a daily record or log of your blood glucose readings. Most glucose meters, if not all, keep your glucose records stored in the meter. Some meters come with the ability to download your records to your home computer. Keeping a record of your blood glucose readings is especially helpful if  you are wanting to look for patterns. Make notes to go along with the blood glucose readings because you might forget what happened at that exact time. Keeping good records helps you and your health care provider to work together to achieve good diabetes management.  Document Released: 09/18/2003 Document Revised: 01/30/2014 Document Reviewed: 02/07/2013 [facility] VirginiaExitCare Patient Information 2015 [place], [place]. This information is not intended to replace advice given to you by your health care provider. Make sure you discuss any questions you have with your health care provider.

## 2015-01-02 NOTE — ED Provider Notes (Signed)
CSN: [id number]     Arrival date & time 01/02/15  1604 History   First MD Initiated Contact with Patient 01/02/15 1619     Chief Complaint  Patient presents with  . Abscess     (Consider location/radiation/quality/duration/timing/severity/associated sxs/prior Treatment) Patient is a 34 y.o. female presenting with abscess. The history is provided by the patient.  Abscess Abscess location: right inner thigh. Size:  3x4 cm Abscess quality: induration, painful, redness and warmth   Red streaking: no   Duration:  3 days Progression:  Worsening Pain details:    Quality:  Dull   Severity:  Moderate   Duration:  3 days   Timing:  Constant   Progression:  Unchanged Chronicity:  New Relieved by:  Nothing Worsened by:  Nothing tried Ineffective treatments:  None tried Associated symptoms: no fatigue, no fever, no headaches, no nausea and no vomiting     Past Medical History  Diagnosis Date  . Hypertension   . Diabetes mellitus without complication    Past Surgical History  Procedure Laterality Date  . No past surgeries     Family History  Problem Relation Age of Onset  . Diabetes Other   . Hypertension Maternal Grandmother    History  Substance Use Topics  . Smoking status: Current Every Day Smoker -- 1.00 packs/day for 15 years  . Smokeless tobacco: Never Used  . Alcohol Use: No   OB History    Gravida Para Term Preterm AB TAB SAB Ectopic Multiple Living   '1    1  1        ' Review of Systems  Constitutional: Negative for fever and fatigue.  HENT: Negative for congestion and drooling.   Eyes: Negative for pain.  Respiratory: Negative for cough and shortness of breath.   Cardiovascular: Negative for chest pain.  Gastrointestinal: Negative for nausea, vomiting, abdominal pain and diarrhea.  Genitourinary: Negative for dysuria and hematuria.  Musculoskeletal: Negative for back pain, gait problem and neck pain.  Skin: Negative for color change.  Neurological: Negative  for dizziness and headaches.  Hematological: Negative for adenopathy.  Psychiatric/Behavioral: Negative for behavioral problems.  All other systems reviewed and are negative.     Allergies  Review of patient's allergies indicates no known allergies.  Home Medications   Prior to Admission medications   Medication Sig Start Date End Date Taking? Authorizing Provider  cephALEXin (KEFLEX) 500 MG capsule Take 1 capsule (500 mg total) by mouth 4 (four) times daily. 12/28/14  Yes Montine Circle, PA-C  glipiZIDE (GLUCOTROL) 2.5 mg TABS tablet Take 0.5 tablets (2.5 mg total) by mouth 2 (two) times daily before a meal. 05/30/14  Yes Deepak Advani, MD  glucose monitoring kit (FREESTYLE) monitoring kit 1 each by Does not apply route 4 (four) times daily - after meals and at bedtime. 1 month Diabetic Testing Supplies for QAC-QHS accuchecks. 05/30/14  Yes Lorayne Marek, MD  HYDROcodone-acetaminophen (NORCO/VICODIN) 5-325 MG per tablet Take 1 tablet by mouth every 6 (six) hours as needed. 12/28/14  Yes Montine Circle, PA-C  lisinopril (PRINIVIL,ZESTRIL) 20 MG tablet Take 0.5 tablets (10 mg total) by mouth daily. 05/30/14  Yes Lorayne Marek, MD  metFORMIN (GLUCOPHAGE) 1000 MG tablet Take 1 tablet (1,000 mg total) by mouth 2 (two) times daily. 05/30/14  Yes Lorayne Marek, MD  naproxen sodium (ANAPROX) 220 MG tablet Take 440 mg by mouth 2 (two) times daily as needed (back pain).   Yes Historical Provider, MD  atorvastatin (LIPITOR) 20 MG tablet  Take 1 tablet (20 mg total) by mouth daily. Patient not taking: Reported on 12/28/2014 06/01/14   Lorayne Marek, MD  ibuprofen (ADVIL,MOTRIN) 600 MG tablet Take 1 tablet (600 mg total) by mouth every 6 (six) hours as needed. Patient not taking: Reported on 12/28/2014 02/22/14   Luvenia Redden, PA-C  varenicline (CHANTIX STARTING MONTH PAK) 0.5 MG X 11 & 1 MG X 42 tablet Take one 0.5 mg tablet by mouth once daily for 3 days, then increase to one 0.5 mg tablet twice daily for 4  days, then increase to one 1 mg tablet twice daily. Patient not taking: Reported on 12/28/2014 05/30/14   Lorayne Marek, MD  Vitamin D, Ergocalciferol, (DRISDOL) 50000 UNITS CAPS capsule Take 1 capsule (50,000 Units total) by mouth every 7 (seven) days. Patient not taking: Reported on 12/28/2014 06/01/14   Lorayne Marek, MD   BP 154/93 mmHg  Pulse 108  Temp(Src) 98.2 F (36.8 C)  Resp 18  SpO2 100%  LMP 11/30/2014 (Approximate) Physical Exam  Constitutional: She is oriented to person, place, and time. She appears well-developed and well-nourished.  HENT:  Head: Normocephalic.  Mouth/Throat: Oropharynx is clear and moist. No oropharyngeal exudate.  Eyes: Conjunctivae and EOM are normal. Pupils are equal, round, and reactive to light.  Neck: Normal range of motion. Neck supple.  Cardiovascular: Normal rate, regular rhythm, normal heart sounds and intact distal pulses.  Exam reveals no gallop and no friction rub.   No murmur heard. Pulmonary/Chest: Effort normal and breath sounds normal. No respiratory distress. She has no wheezes.  Abdominal: Soft. Bowel sounds are normal. There is no tenderness. There is no rebound and no guarding.  Musculoskeletal: Normal range of motion. She exhibits no edema or tenderness.  3 x 4 area of induration, mild erythema, and tenderness in the right inner thigh proximal to the right labia majora.  Neurological: She is alert and oriented to person, place, and time.  Skin: Skin is warm and dry.  Psychiatric: She has a normal mood and affect. Her behavior is normal.  Nursing note and vitals reviewed.   ED Course  INCISION AND DRAINAGE Date/Time: 01/02/2015 5:57 PM Performed by: Pamella Pert Authorized by: Pamella Pert Consent: Verbal consent obtained. Written consent not obtained. Risks and benefits: risks, benefits and alternatives were discussed Consent given by: patient Patient understanding: patient states understanding of the procedure being  performed Required items: required blood products, implants, devices, and special equipment available Patient identity confirmed: verbally with patient, arm band, provided demographic data, hospital-assigned identification number and anonymous protocol, patient vented/unresponsive Time out: Immediately prior to procedure a "time out" was called to verify the correct patient, procedure, equipment, support staff and site/side marked as required. Type: abscess Location: right upper inner thigh. Anesthesia: local infiltration Local anesthetic: lidocaine 2% with epinephrine Anesthetic total: 5 ml Patient sedated: no Scalpel size: 11 Incision type: single straight Complexity: simple Drainage: purulent Drainage amount: moderate Wound treatment: drain placed Packing material: 1/2 in iodoform gauze Patient tolerance: Patient tolerated the procedure well with no immediate complications   (including critical care time) Labs Review Labs Reviewed  BASIC METABOLIC PANEL - Abnormal; Notable for the following:    Sodium 134 (*)    Glucose, Bld 373 (*)    All other components within normal limits  CBC - Abnormal; Notable for the following:    WBC 13.8 (*)    All other components within normal limits  CBG MONITORING, ED - Abnormal; Notable for the following:  Glucose-Capillary 328 (*)    All other components within normal limits  CBG MONITORING, ED - Abnormal; Notable for the following:    Glucose-Capillary 316 (*)    All other components within normal limits    Imaging Review No results found.   EKG Interpretation None      MDM   Final diagnoses:  Abscess  Hyperglycemia    4:53 PM 34 y.o. female w hx of HTN, DM who pw right inner thigh abscess. She was seen last week for an abscess in a proximal location which was drained. She notes resolution of abscess and development of a new one over the last few days. She denies any fevers, vomiting, diarrhea. She is afebrile and mildly  tachycardic here. We'll plan for I/D.  6:57 PM: The patient tolerated the procedure well. Lab work is noncontributory. Blood sugars decreasing appropriately with IV fluids. No anion gap.  I have discussed the diagnosis/risks/treatment options with the patient and believe the pt to be eligible for discharge home to follow-up with her pcp as needed. We also discussed returning to the ED immediately if new or worsening sx occur. We discussed the sx which are most concerning (e.g., worsening pain, recurrent abscess, fever, spreading redness) that necessitate immediate return. Medications administered to the patient during their visit and any new prescriptions provided to the patient are listed below.  Medications given during this visit Medications  lidocaine-EPINEPHrine (XYLOCAINE W/EPI) 2 %-1:100000 (with pres) injection 30 mL (30 mLs Intradermal Given 01/02/15 1753)  sodium chloride 0.9 % bolus 1,000 mL (1,000 mLs Intravenous New Bag/Given 01/02/15 1752)    New Prescriptions   No medications on file     Pamella Pert, MD 01/02/15 1857

## 2015-07-04 ENCOUNTER — Other Ambulatory Visit (HOSPITAL_COMMUNITY)
Admission: RE | Admit: 2015-07-04 | Discharge: 2015-07-04 | Disposition: A | Discharge status: Home / Self Care | Payer: 59 | Source: Ambulatory Visit | Attending: Family Medicine | Admitting: Family Medicine

## 2015-07-04 ENCOUNTER — Other Ambulatory Visit: Payer: Self-pay | Admitting: Family Medicine

## 2015-07-04 DIAGNOSIS — Z1151 Encounter for screening for human papillomavirus (HPV): Secondary | ICD-10-CM | POA: Insufficient documentation

## 2015-07-04 DIAGNOSIS — Z124 Encounter for screening for malignant neoplasm of cervix: Secondary | ICD-10-CM | POA: Diagnosis present

## 2015-07-06 LAB — CYTOLOGY - PAP

## 2015-10-05 ENCOUNTER — Other Ambulatory Visit: Payer: Self-pay | Admitting: Family Medicine

## 2015-10-05 ENCOUNTER — Other Ambulatory Visit (HOSPITAL_COMMUNITY)
Admission: RE | Admit: 2015-10-05 | Discharge: 2015-10-05 | Disposition: A | Discharge status: Home / Self Care | Payer: Self-pay | Source: Ambulatory Visit | Attending: Family Medicine | Admitting: Family Medicine

## 2015-10-05 DIAGNOSIS — Z01419 Encounter for gynecological examination (general) (routine) without abnormal findings: Secondary | ICD-10-CM | POA: Insufficient documentation

## 2015-10-09 LAB — CYTOLOGY - PAP

## 2016-07-29 ENCOUNTER — Encounter (HOSPITAL_COMMUNITY): Payer: Self-pay

## 2016-07-29 ENCOUNTER — Emergency Department (HOSPITAL_COMMUNITY)
Admission: EM | Admit: 2016-07-29 | Discharge: 2016-07-29 | Disposition: A | Discharge status: Home / Self Care | Payer: Self-pay | Attending: Emergency Medicine | Admitting: Emergency Medicine

## 2016-07-29 ENCOUNTER — Emergency Department (HOSPITAL_COMMUNITY): Payer: Self-pay

## 2016-07-29 DIAGNOSIS — E119 Type 2 diabetes mellitus without complications: Secondary | ICD-10-CM | POA: Insufficient documentation

## 2016-07-29 DIAGNOSIS — Z7984 Long term (current) use of oral hypoglycemic drugs: Secondary | ICD-10-CM | POA: Insufficient documentation

## 2016-07-29 DIAGNOSIS — I10 Essential (primary) hypertension: Secondary | ICD-10-CM | POA: Insufficient documentation

## 2016-07-29 DIAGNOSIS — Z79899 Other long term (current) drug therapy: Secondary | ICD-10-CM | POA: Insufficient documentation

## 2016-07-29 DIAGNOSIS — N3000 Acute cystitis without hematuria: Secondary | ICD-10-CM | POA: Insufficient documentation

## 2016-07-29 DIAGNOSIS — F17200 Nicotine dependence, unspecified, uncomplicated: Secondary | ICD-10-CM | POA: Insufficient documentation

## 2016-07-29 LAB — URINALYSIS, ROUTINE W REFLEX MICROSCOPIC
BILIRUBIN URINE: NEGATIVE
Glucose, UA: 250 mg/dL — AB
HGB URINE DIPSTICK: NEGATIVE
KETONES UR: NEGATIVE mg/dL
Nitrite: POSITIVE — AB
PROTEIN: NEGATIVE mg/dL
Specific Gravity, Urine: 1.025 (ref 1.005–1.030)
pH: 6 (ref 5.0–8.0)

## 2016-07-29 LAB — URINE MICROSCOPIC-ADD ON: RBC / HPF: NONE SEEN RBC/hpf (ref 0–5)

## 2016-07-29 LAB — I-STAT CHEM 8, ED
BUN: 7 mg/dL (ref 6–20)
CALCIUM ION: 1.12 mmol/L — AB (ref 1.15–1.40)
CREATININE: 0.6 mg/dL (ref 0.44–1.00)
Chloride: 101 mmol/L (ref 101–111)
GLUCOSE: 218 mg/dL — AB (ref 65–99)
HCT: 46 % (ref 36.0–46.0)
HEMOGLOBIN: 15.6 g/dL — AB (ref 12.0–15.0)
Potassium: 4.2 mmol/L (ref 3.5–5.1)
Sodium: 138 mmol/L (ref 135–145)
TCO2: 29 mmol/L (ref 0–100)

## 2016-07-29 LAB — POC URINE PREG, ED: Preg Test, Ur: NEGATIVE

## 2016-07-29 MED ORDER — DEXTROSE 5 % IV SOLN
1.0000 g | Freq: Once | INTRAVENOUS | Status: AC
  Administered 2016-07-29: 1 g via INTRAVENOUS
  Filled 2016-07-29: qty 10

## 2016-07-29 MED ORDER — SODIUM CHLORIDE 0.9 % IV BOLUS (SEPSIS)
1000.0000 mL | Freq: Once | INTRAVENOUS | Status: AC
  Administered 2016-07-29: 1000 mL via INTRAVENOUS

## 2016-07-29 MED ORDER — CEPHALEXIN 500 MG PO CAPS: ORAL_CAPSULE | ORAL | 0 refills | Status: DC

## 2016-07-29 MED ORDER — KETOROLAC TROMETHAMINE 30 MG/ML IJ SOLN
30.0000 mg | Freq: Once | INTRAMUSCULAR | Status: AC
  Administered 2016-07-29: 30 mg via INTRAVENOUS
  Filled 2016-07-29: qty 1

## 2016-07-29 MED ORDER — ALBUTEROL SULFATE HFA 108 (90 BASE) MCG/ACT IN AERS
2.0000 | INHALATION_SPRAY | Freq: Once | RESPIRATORY_TRACT | Status: AC
  Administered 2016-07-29: 2 via RESPIRATORY_TRACT
  Filled 2016-07-29: qty 6.7

## 2016-07-29 MED ORDER — CYCLOBENZAPRINE HCL 10 MG PO TABS
5.0000 mg | ORAL_TABLET | Freq: Once | ORAL | Status: AC
  Administered 2016-07-29: 5 mg via ORAL
  Filled 2016-07-29: qty 1

## 2016-07-29 MED ORDER — PREDNISONE 20 MG PO TABS
60.0000 mg | ORAL_TABLET | Freq: Once | ORAL | Status: AC
  Administered 2016-07-29: 60 mg via ORAL
  Filled 2016-07-29: qty 3

## 2016-07-29 MED ORDER — ACETAMINOPHEN 325 MG PO TABS: 650.0000 mg | ORAL_TABLET | Freq: Four times a day (QID) | ORAL | 0 refills | Status: DC | PRN

## 2016-07-29 MED ORDER — CYCLOBENZAPRINE HCL 10 MG PO TABS: 10.0000 mg | ORAL_TABLET | Freq: Two times a day (BID) | ORAL | 0 refills | Status: DC | PRN

## 2016-07-29 NOTE — ED Triage Notes (Signed)
Pt here reporting lumbar back pain X4 days. Pt denies dysuria. Pt ambulatory to triage.

## 2016-07-29 NOTE — ED Provider Notes (Addendum)
[facility]-EMERGENCY DEPT Provider Note   CSN: [id number] Arrival date & time: 07/29/16  [id number]     History   Chief Complaint Chief Complaint  Patient presents with  . Back Pain    HPI Avis EpleyRuby Loureiro is a 35 y.o. female.   Back Pain   This is a recurrent problem. The current episode started more than 2 days ago. The problem occurs constantly. The problem has been gradually worsening. The pain is associated with no known injury. The pain is present in the lumbar spine. The quality of the pain is described as aching. The pain does not radiate. The patient is experiencing no pain. The symptoms are aggravated by bending and twisting. Associated symptoms include dysuria. Pertinent negatives include no chest pain and no fever.    Past Medical History:  Diagnosis Date  . Diabetes mellitus without complication (HCC)   . Hypertension     Patient Active Problem List   Diagnosis Date Noted  . Diabetes mellitus due to underlying condition without complications (HCC) 05/30/2014  . Essential hypertension, benign 05/30/2014  . Smoking 05/30/2014    Past Surgical History:  Procedure Laterality Date  . NO PAST SURGERIES      OB History    Gravida Para Term Preterm AB Living   1       1     SAB TAB Ectopic Multiple Live Births   1               Home Medications    Prior to Admission medications   Medication Sig Start Date End Date Taking? Authorizing Provider  metFORMIN (GLUCOPHAGE) 1000 MG tablet Take 1 tablet (1,000 mg total) by mouth 2 (two) times daily. 05/30/14  Yes Doris Cheadleeepak Advani, MD  acetaminophen (TYLENOL) 325 MG tablet Take 2 tablets (650 mg total) by mouth every 6 (six) hours as needed. 07/29/16   Marily MemosJason Nafisah Runions, MD  atorvastatin (LIPITOR) 20 MG tablet Take 1 tablet (20 mg total) by mouth daily. Patient not taking: Reported on 07/29/2016 06/01/14   Doris Cheadleeepak Advani, MD  cephALEXin (KEFLEX) 500 MG capsule 2 caps po bid x 7 days 07/29/16   Marily MemosJason Marina Desire, MD  cyclobenzaprine  (FLEXERIL) 10 MG tablet Take 1 tablet (10 mg total) by mouth 2 (two) times daily as needed for muscle spasms. 07/29/16   Marily MemosJason Montoya Brandel, MD  glipiZIDE (GLUCOTROL) 2.5 mg TABS tablet Take 0.5 tablets (2.5 mg total) by mouth 2 (two) times daily before a meal. Patient not taking: Reported on 07/29/2016 05/30/14   Doris Cheadleeepak Advani, MD  HYDROcodone-acetaminophen (NORCO/VICODIN) 5-325 MG per tablet Take 1 tablet by mouth every 6 (six) hours as needed. Patient not taking: Reported on 07/29/2016 12/28/14   Roxy Horsemanobert Browning, PA-C  ibuprofen (ADVIL,MOTRIN) 600 MG tablet Take 1 tablet (600 mg total) by mouth every 6 (six) hours as needed. Patient not taking: Reported on 07/29/2016 02/22/14   Marny LowensteinJulie N Wenzel, PA-C  lisinopril (PRINIVIL,ZESTRIL) 20 MG tablet Take 0.5 tablets (10 mg total) by mouth daily. Patient not taking: Reported on 07/29/2016 05/30/14   Doris Cheadleeepak Advani, MD  naproxen sodium (ANAPROX) 220 MG tablet Take 440 mg by mouth 2 (two) times daily as needed (back pain).    Historical Provider, MD  varenicline (CHANTIX STARTING MONTH PAK) 0.5 MG X 11 & 1 MG X 42 tablet Take one 0.5 mg tablet by mouth once daily for 3 days, then increase to one 0.5 mg tablet twice daily for 4 days, then increase to one 1 mg tablet twice daily.  Patient not taking: Reported on 07/29/2016 05/30/14   Doris Cheadle, MD  Vitamin D, Ergocalciferol, (DRISDOL) 50000 UNITS CAPS capsule Take 1 capsule (50,000 Units total) by mouth every 7 (seven) days. Patient not taking: Reported on 07/29/2016 06/01/14   Doris Cheadle, MD    Family History Family History  Problem Relation Age of Onset  . Diabetes Other   . Hypertension Maternal Grandmother     Social History Social History  Substance Use Topics  . Smoking status: Current Every Day Smoker    Packs/day: 1.00    Years: 15.00  . Smokeless tobacco: Never Used  . Alcohol use No     Allergies   Review of patient's allergies indicates no known allergies.   Review of Systems Review of  Systems  Constitutional: Negative for chills and fever.  Respiratory: Positive for cough and shortness of breath.   Cardiovascular: Negative for chest pain and leg swelling.  Genitourinary: Positive for decreased urine volume and dysuria. Negative for frequency, hematuria and urgency.  Musculoskeletal: Positive for back pain. Negative for myalgias, neck pain and neck stiffness.  All other systems reviewed and are negative.    Physical Exam Updated Vital Signs BP (!) 166/104 (BP Location: Right Arm)   Pulse 78   Temp 98.2 F (36.8 C) (Oral)   Resp 18   LMP 07/28/2016 (Exact Date) Comment: Negative pregnancy test  SpO2 99%   Physical Exam  Constitutional: She appears well-developed and well-nourished.  HENT:  Head: Normocephalic and atraumatic.  Eyes: Conjunctivae and EOM are normal.  Neck: Normal range of motion.  Cardiovascular: Normal rate and regular rhythm.   Pulmonary/Chest: Effort normal and breath sounds normal. No stridor. No respiratory distress.  Abdominal: Soft. She exhibits no distension. There is no tenderness.  Musculoskeletal: Normal range of motion. She exhibits tenderness (minimal midline lumbar area, also ttp in paraspinal/cva area).  Neurological: She is alert.  Skin: No rash noted. No pallor.  Psychiatric: She has a normal mood and affect. Her behavior is normal.  Nursing note and vitals reviewed.    ED Treatments / Results  Labs (all labs ordered are listed, but only abnormal results are displayed) Labs Reviewed  URINALYSIS, ROUTINE W REFLEX MICROSCOPIC (NOT AT [facility]) - Abnormal; Notable for the following:       Result Value   Color, Urine AMBER (*)    APPearance TURBID (*)    Glucose, UA 250 (*)    Nitrite POSITIVE (*)    Leukocytes, UA SMALL (*)    All other components within normal limits  URINE MICROSCOPIC-ADD ON - Abnormal; Notable for the following:    Squamous Epithelial / LPF TOO NUMEROUS TO COUNT (*)    Bacteria, UA MANY (*)    All other  components within normal limits  I-STAT CHEM 8, ED - Abnormal; Notable for the following:    Glucose, Bld 218 (*)    Calcium, Ion 1.12 (*)    Hemoglobin 15.6 (*)    All other components within normal limits  POC URINE PREG, ED    EKG  EKG Interpretation None       Radiology Dg Chest 2 View  Result Date: 07/29/2016 CLINICAL DATA:  Cough for 5 days EXAM: CHEST  2 VIEW COMPARISON:  12/27/2012 FINDINGS: Cardiomediastinal silhouette is stable. No acute infiltrate or pleural effusion. No pulmonary edema. Bony thorax is unremarkable. IMPRESSION: No active cardiopulmonary disease. Electronically Signed   By: Natasha Mead M.D.   On: 07/29/2016 13:26   Dg  Lumbar Spine Complete  Result Date: 07/29/2016 CLINICAL DATA:  Acute low back pain for 4 days. EXAM: LUMBAR SPINE - COMPLETE 4+ VIEW COMPARISON:  01/16/2011 FINDINGS: Normal lumbar spine alignment. Preserved vertebral body heights and disc spaces. No acute compression fracture, wedge-shaped deformity or focal kyphosis. No pars defects. Normal appearing pedicles and SI joints. Early aortic atherosclerosis present. IMPRESSION: No acute osseous finding or malalignment. Early abdominal aortic atherosclerosis Electronically Signed   By: Judie PetitM.  Shick M.D.   On: 07/29/2016 13:27    Procedures Procedures (including critical care time)  EMERGENCY DEPARTMENT ULTRASOUND  Study: Limited Retroperitoneal Ultrasound of the Abdominal Aorta.  INDICATIONS:Back pain Multiple views of the abdominal aorta were obtained in real-time from the diaphragmatic hiatus to the aortic bifurcation in transverse planes with a multi-frequency probe. PERFORMED BY: Myself IMAGES ARCHIVED?: Yes FINDINGS: Free fluid absent LIMITATIONS:  Bowel gas INTERPRETATION:  No abdominal aortic aneurysm   CPT Code: [phone] (limited retroperitoneal)    Medications Ordered in ED Medications  cefTRIAXone (ROCEPHIN) 1 g in dextrose 5 % 50 mL IVPB (1 g Intravenous New Bag/Given  07/29/16 1432)  sodium chloride 0.9 % bolus 1,000 mL (0 mLs Intravenous Stopped 07/29/16 1432)  cyclobenzaprine (FLEXERIL) tablet 5 mg (5 mg Oral Given 07/29/16 1233)  ketorolac (TORADOL) 30 MG/ML injection 30 mg (30 mg Intravenous Given 07/29/16 1241)  albuterol (PROVENTIL HFA;VENTOLIN HFA) 108 (90 Base) MCG/ACT inhaler 2 puff (2 puffs Inhalation Given 07/29/16 1247)  predniSONE (DELTASONE) tablet 60 mg (60 mg Oral Given 07/29/16 1233)     Initial Impression / Assessment and Plan / ED Course  I have reviewed the triage vital signs and the nursing notes.  Pertinent labs & imaging results that were available during my care of the patient were reviewed by me and considered in my medical decision making (see chart for details).  Clinical Course   Smoker, htn and back pain but No AAA on US.  XR negative, doubt fracture.  Labs OK aside from UTI, will treat appropriately, culture added on. One week of antibiotics given.  Cough, sob, likely bronchitis. Will treat appropriately.  HTN improved with pain control, will need to take home HTN meds.    Final Clinical Impressions(s) / ED Diagnoses   Final diagnoses:  Acute cystitis without hematuria    New Prescriptions New Prescriptions   ACETAMINOPHEN (TYLENOL) 325 MG TABLET    Take 2 tablets (650 mg total) by mouth every 6 (six) hours as needed.   CEPHALEXIN (KEFLEX) 500 MG CAPSULE    2 caps po bid x 7 days   CYCLOBENZAPRINE (FLEXERIL) 10 MG TABLET    Take 1 tablet (10 mg total) by mouth 2 (two) times daily as needed for muscle spasms.     Marily MemosJason Jaquane Boughner, MD 07/29/16 [phone]

## 2016-07-29 NOTE — ED Notes (Signed)
Took Aleve yesterday without relief-- unable to get comfortable.

## 2016-08-01 LAB — URINE CULTURE

## 2016-08-02 ENCOUNTER — Telehealth (HOSPITAL_BASED_OUTPATIENT_CLINIC_OR_DEPARTMENT_OTHER): Payer: Self-pay

## 2016-08-02 NOTE — Telephone Encounter (Signed)
Post ED Visit - Positive Culture Follow-up: Unsuccessful Patient Follow-up  Culture assessed and recommendations reviewed by: []  Enzo BiNathan Batchelder, Pharm.D. []  Celedonio MiyamotoJeremy Frens, Pharm.D., BCPS []  Garvin FilaMike Maccia, Pharm.D. []  Georgina PillionElizabeth Martin, Pharm.D., BCPS []  BlocktonMinh Pham, [address]Pharm[address]D., BCPS, AAHIVP []  Estella HuskMichelle Turner, Pharm.D., BCPS, AAHIVP []  Tennis Mustassie Stewart, Pharm.D. []  Sherle Poeob Vincent, [address]Ph[address] Casilda Carlsaylor Stone Pharm D Positive urine culture  []  Patient discharged without antimicrobial prescription and treatment is now indicated [x]  Organism is resistant to prescribed ED discharge antimicrobial []  Patient with positive blood cultures   Unable to contact patient after 3 attempts, letter will be sent to address on file  Jerry CarasCullom, Assia Meanor Burnett 08/02/2016, 12:08 PM

## 2016-08-02 NOTE — Progress Notes (Signed)
ED Antimicrobial Stewardship Positive Culture Follow Up   Ebony EpleyRuby Matthews is an 35 y.o. female [facility] with a chief complaint of  Chief Complaint  Patient presents with  . Back Pain    Recent Results (from the past 720 hour(s))  Urine culture     Status: Abnormal   Collection Time: 07/29/16 12:30 PM  Result Value Ref Range Status   Specimen Description URINE, RANDOM  Final   Special Requests NONE  Final   Culture >=100,000 COLONIES/mL ENTEROBACTER AEROGENES (A)  Final   Report Status 08/01/2016 FINAL  Final   Organism ID, Bacteria ENTEROBACTER AEROGENES (A)  Final      Susceptibility   Enterobacter aerogenes - MIC*    CEFAZOLIN <=4 RESISTANT Resistant     CEFTRIAXONE <=1 SENSITIVE Sensitive     CIPROFLOXACIN <=0.25 SENSITIVE Sensitive     GENTAMICIN <=1 SENSITIVE Sensitive     IMIPENEM 1 SENSITIVE Sensitive     NITROFURANTOIN 64 INTERMEDIATE Intermediate     TRIMETH/SULFA <=20 SENSITIVE Sensitive     PIP/TAZO <=4 SENSITIVE Sensitive     * >=100,000 COLONIES/mL ENTEROBACTER AEROGENES    [x]  Treated with cephalexin, organism resistant to prescribed antimicrobial  New antibiotic prescription: Stop cephalexin. Start ciprofloxacin 500 mg BID x 7 days  ED Provider: Cheri FowlerKayla Rose, PA-C  Casilda Carlsaylor Leland Raver, PharmD. PGY-2 Infectious Diseases Pharmacy Resident Pager: [phone] 08/02/2016, 9:09 AM

## 2016-08-03 NOTE — Telephone Encounter (Signed)
Post ED Visit - Positive Culture Follow-up: Successful Patient Follow-Up  Culture assessed and recommendations reviewed by: []  Enzo BiNathan Batchelder, Pharm.D. []  Celedonio MiyamotoJeremy Frens, [address]Pharm[address]D., BCPS []  Garvin FilaMike Maccia, Pharm.D. []  Georgina PillionElizabeth Martin, Pharm.D., BCPS []  [place]org[place]Minh Pham, [place]Pharm.D., BCPS, AAHIVP []  Estella HuskMichelle Turner, Pharm.D., BCPS, AAHIVP []  Tennis Mustassie Stewart, Pharm.D. []  Sherle Poeob Vincent, [address]Ph[address] Casilda Carlsaylor Stone Pharm D Positive urine culture  []  Patient discharged without antimicrobial prescription and treatment is now indicated [x]  Organism is resistant to prescribed ED discharge antimicrobial []  Patient with positive blood cultures  Changes discussed with ED provider: Cheri FowlerKayla Josean Lycan [facility] New antibiotic prescription Cipro 500 mg BID x 7 days [phone]  Contacted patient, date 08/03/2016, time 1235   Elyza Whitt, Linnell FullingRose Burnett 08/03/2016, 12:34 PM

## 2017-01-17 ENCOUNTER — Emergency Department (HOSPITAL_COMMUNITY)
Admission: EM | Admit: 2017-01-17 | Discharge: 2017-01-17 | Disposition: A | Discharge status: Home / Self Care | Payer: BLUE CROSS/BLUE SHIELD | Attending: Emergency Medicine | Admitting: Emergency Medicine

## 2017-01-17 ENCOUNTER — Encounter (HOSPITAL_COMMUNITY): Payer: Self-pay | Admitting: Emergency Medicine

## 2017-01-17 ENCOUNTER — Emergency Department (HOSPITAL_COMMUNITY): Payer: BLUE CROSS/BLUE SHIELD

## 2017-01-17 DIAGNOSIS — Z79899 Other long term (current) drug therapy: Secondary | ICD-10-CM | POA: Insufficient documentation

## 2017-01-17 DIAGNOSIS — F17200 Nicotine dependence, unspecified, uncomplicated: Secondary | ICD-10-CM | POA: Diagnosis not present

## 2017-01-17 DIAGNOSIS — I10 Essential (primary) hypertension: Secondary | ICD-10-CM | POA: Diagnosis not present

## 2017-01-17 DIAGNOSIS — M25512 Pain in left shoulder: Secondary | ICD-10-CM | POA: Insufficient documentation

## 2017-01-17 DIAGNOSIS — E119 Type 2 diabetes mellitus without complications: Secondary | ICD-10-CM | POA: Insufficient documentation

## 2017-01-17 MED ORDER — DICLOFENAC SODIUM 75 MG PO TBEC: 75.0000 mg | DELAYED_RELEASE_TABLET | Freq: Two times a day (BID) | ORAL | 0 refills | Status: AC

## 2017-01-17 MED ORDER — LISINOPRIL 10 MG PO TABS: 10.0000 mg | ORAL_TABLET | Freq: Every day | ORAL | 0 refills | Status: DC

## 2017-01-17 NOTE — ED Notes (Signed)
Bed: WTR6 Expected date:  Expected time:  Means of arrival:  Comments: 

## 2017-01-17 NOTE — ED Triage Notes (Signed)
Pt reports L shoulder pain for the last 4 days. No known injury.

## 2017-01-17 NOTE — Discharge Instructions (Signed)
Take diclofenac twice per day with meals. Do not take advil, motrin, tylenol, or aleve while taking this medication. If you have any upset stomach issues including blood in your stool, stop taking the medication and follow up with a PCP. Follow up with a PCP for your blood pressure and diabetes. Return to the ED if you develop fever, chills, weakness, difficulty walking, loss of bowel or bladder control, or any other concerning symptoms.

## 2017-01-17 NOTE — ED Provider Notes (Signed)
[facility]-EMERGENCY DEPT Provider Note   CSN: [id number] Arrival date & time: 01/17/17  4098  By signing my name below, I, Sonum Patel, attest that this documentation has been prepared under the direction and in the presence of [employer]. Electronically Signed: Sonum Patel, Neurosurgeon. 01/17/17. 10:40 AM.  History   Chief Complaint Chief Complaint  Patient presents with  . Shoulder Pain    The history is provided by the patient. No language interpreter was used.     HPI Comments: Ebony Matthews is a 36 y.o. female who presents to the Emergency Department complaining of constant, unchanged left sided shoulder pain that began 4 ago. She states the pain has gradually moved to the left upper arm and radiates down the LUE. She states lifting and abduction the LUE aggravates her pain. She describes the pain as an ache and rates it as 6-7/10 currently. She reports associated paresthesia ("it feels cold sometimes") down to the left wrist. She has tried Aleve and Tylenol without relief. She denies history of similar symptoms. She denies any known injury or trauma to the affected area. She denies neck pain, back pain, weakness, numbness, fever, chills, CP, SOB, abdominal pain, nausea, vomiting, diarrhea, rash, cough, any other joint or extremity pain. She denies recent long distance travel or immobilizations. She denies birth control use. She denies history of blood clots.   Past Medical History:  Diagnosis Date  . Diabetes mellitus without complication (HCC)   . Hypertension     Patient Active Problem List   Diagnosis Date Noted  . Diabetes mellitus due to underlying condition without complications (HCC) 05/30/2014  . Essential hypertension, benign 05/30/2014  . Smoking 05/30/2014    Past Surgical History:  Procedure Laterality Date  . NO PAST SURGERIES      OB History    Gravida Para Term Preterm AB Living   1       1     SAB TAB Ectopic Multiple Live Births   1                Home Medications    Prior to Admission medications   Medication Sig Start Date End Date Taking? Authorizing Provider  acetaminophen (TYLENOL) 325 MG tablet Take 2 tablets (650 mg total) by mouth every 6 (six) hours as needed. 07/29/16   Marily Memos, MD  atorvastatin (LIPITOR) 20 MG tablet Take 1 tablet (20 mg total) by mouth daily. Patient not taking: Reported on 07/29/2016 06/01/14   Doris Cheadle, MD  cephALEXin (KEFLEX) 500 MG capsule 2 caps po bid x 7 days 07/29/16   Marily Memos, MD  cyclobenzaprine (FLEXERIL) 10 MG tablet Take 1 tablet (10 mg total) by mouth 2 (two) times daily as needed for muscle spasms. 07/29/16   Marily Memos, MD  diclofenac (VOLTAREN) 75 MG EC tablet Take 1 tablet (75 mg total) by mouth 2 (two) times daily. 01/17/17 01/31/17  Filippo Puls A Loranzo Desha, PA-C  glipiZIDE (GLUCOTROL) 2.5 mg TABS tablet Take 0.5 tablets (2.5 mg total) by mouth 2 (two) times daily before a meal. Patient not taking: Reported on 07/29/2016 05/30/14   Doris Cheadle, MD  HYDROcodone-acetaminophen (NORCO/VICODIN) 5-325 MG per tablet Take 1 tablet by mouth every 6 (six) hours as needed. Patient not taking: Reported on 07/29/2016 12/28/14   Roxy Horseman, PA-C  ibuprofen (ADVIL,MOTRIN) 600 MG tablet Take 1 tablet (600 mg total) by mouth every 6 (six) hours as needed. Patient not taking: Reported on 07/29/2016 02/22/14   Marny Lowenstein,  PA-C  lisinopril (PRINIVIL,ZESTRIL) 10 MG tablet Take 1 tablet (10 mg total) by mouth daily. 01/17/17 01/31/17  Devery Odwyer A Sanaiya Welliver, PA-C  metFORMIN (GLUCOPHAGE) 1000 MG tablet Take 1 tablet (1,000 mg total) by mouth 2 (two) times daily. 05/30/14   Doris Cheadle, MD  varenicline (CHANTIX STARTING MONTH PAK) 0.5 MG X 11 & 1 MG X 42 tablet Take one 0.5 mg tablet by mouth once daily for 3 days, then increase to one 0.5 mg tablet twice daily for 4 days, then increase to one 1 mg tablet twice daily. Patient not taking: Reported on 07/29/2016 05/30/14   Doris Cheadle, MD  Vitamin D,  Ergocalciferol, (DRISDOL) 50000 UNITS CAPS capsule Take 1 capsule (50,000 Units total) by mouth every 7 (seven) days. Patient not taking: Reported on 07/29/2016 06/01/14   Doris Cheadle, MD    Family History Family History  Problem Relation Age of Onset  . Diabetes Other   . Hypertension Maternal Grandmother     Social History Social History  Substance Use Topics  . Smoking status: Current Every Day Smoker    Packs/day: 1.00    Years: 15.00  . Smokeless tobacco: Never Used  . Alcohol use No     Allergies   Patient has no known allergies.   Review of Systems Review of Systems  Constitutional: Negative for chills and fever.  Respiratory: Negative for cough and shortness of breath.   Cardiovascular: Negative for chest pain.  Gastrointestinal: Negative for abdominal pain, diarrhea, nausea and vomiting.  Musculoskeletal: Positive for arthralgias and myalgias. Negative for back pain, joint swelling and neck pain.  Skin: Negative for rash.  Neurological: Negative for weakness and numbness.     Physical Exam Updated Vital Signs BP (!) 150/107 (BP Location: Right Arm)   Pulse 90   Temp 97.7 F (36.5 C) (Oral)   Resp 18   LMP 12/27/2016 (Approximate)   SpO2 100%   Physical Exam  Constitutional: She is oriented to person, place, and time. She appears well-developed and well-nourished. No distress.  HENT:  Head: Normocephalic and atraumatic.  Eyes: Conjunctivae are normal. Right eye exhibits no discharge. Left eye exhibits no discharge. No scleral icterus.  Neck: Normal range of motion. Neck supple. No JVD present. No tracheal deviation present.  No midline CSP ttp. No paraspinal muscle tenderness or spasm noted  Cardiovascular: Regular rhythm and normal heart sounds.  Tachycardia present.   Pulses:      Radial pulses are 2+ on the right side, and 2+ on the left side.  Pulmonary/Chest: Effort normal and breath sounds normal.  Abdominal: Soft. She exhibits no distension.  There is no tenderness.  Musculoskeletal: She exhibits tenderness. She exhibits no edema or deformity.  LUE: somewhat limited ROM with internal rotation and adduction; negative empty can sign, negative Hawkins, negative Neer's; 5/5 strength to BUE; no tenderness to palpation along AC joint or clavicle; some tenderness to palpation overlying biceps tendon groove. No muscular deformity or atrophy noted.  Neurological: She is alert and oriented to person, place, and time. No sensory deficit.  Fluent speech, no facial droop, sensation intact globally  Skin: Skin is warm and dry. Rash noted. She is not diaphoretic. No erythema.  No rash, erythema, or temperature difference noted in BUE  Psychiatric: She has a normal mood and affect. Her behavior is normal.  Nursing note and vitals reviewed.    ED Treatments / Results  DIAGNOSTIC STUDIES: Oxygen Saturation is 100% on RA, normal by my interpretation.  COORDINATION OF CARE: 10:19 AM Discussed treatment plan with pt at bedside and pt agreed to plan.   Labs (all labs ordered are listed, but only abnormal results are displayed) Labs Reviewed - No data to display  EKG  EKG Interpretation None       Radiology Dg Shoulder Left  Result Date: 01/17/2017 CLINICAL DATA:  Pt c/o left shoulder pain x 4 days no injury, worsens with rotation and abduction EXAM: LEFT SHOULDER - 2+ VIEW COMPARISON:  None. FINDINGS: No fracture. Small focal area of sclerosis noted in the anterior aspect of the humeral head at the base of the lesser tuberosity consistent with a bone island. No other bone lesions. The glenohumeral and AC joints are normally spaced and aligned with no arthropathic changes. Soft tissues are unremarkable. IMPRESSION: 1. No fracture, dislocation or arthropathic change. Electronically Signed   By: Amie Portland M.D.   On: 01/17/2017 09:54    Procedures Procedures (including critical care time)  Medications Ordered in ED Medications - No  data to display   Initial Impression / Assessment and Plan / ED Course  I have reviewed the triage vital signs and the nursing notes.  Pertinent labs & imaging results that were available during my care of the patient were reviewed by me and considered in my medical decision making (see chart for details).     Pt with hx HTN and DM presents to ED with chief complaint left shoulder pain x 4 days. Afebrile, initially hypertensive and tachycardic on exam. Pt states she has not been taking her HTN medications for 4 months due to cost but she now has insurance and is looking to establish care with new PCP. BP lower and tachycardia resolved on re-examination.   Neurovascularly intact on examination. Strength intact. Pain with adduction and internal rotation of shoulder and ttp along biceps tendon groove. Xrays negative. Low suspicion DVT or ACS/MI in absence of risk factors. Possible biceps tendonitis. No further emergent workup required at this time. Discussed supportive treatment with RICE, heat, gentle stretching and strengthening exercises. Rx for voltaren and 2 week supply of lisinopril given. Recommend f/u with PCP for re-evaluation of HTN and possible referral to physical therapy if symptoms do not improve. Discussed strict ED return precautions. Pt verbalized understanding of and agreement with plan and is safe for discharge home at this time.  Final Clinical Impressions(s) / ED Diagnoses   Final diagnoses:  Acute pain of left shoulder    New Prescriptions New Prescriptions   DICLOFENAC (VOLTAREN) 75 MG EC TABLET    Take 1 tablet (75 mg total) by mouth 2 (two) times daily.   LISINOPRIL (PRINIVIL,ZESTRIL) 10 MG TABLET    Take 1 tablet (10 mg total) by mouth daily.   I personally performed the services described in this documentation, which was scribed in my presence. The recorded information has been reviewed and is accurate.    Jeanie Sewer, PA-C 01/17/17 1055    Lavera Guise,  MD 01/18/17 [phone]

## 2018-02-13 ENCOUNTER — Emergency Department (HOSPITAL_COMMUNITY)
Admission: EM | Admit: 2018-02-13 | Discharge: 2018-02-13 | Disposition: A | Discharge status: Home / Self Care | Payer: BLUE CROSS/BLUE SHIELD | Attending: Emergency Medicine | Admitting: Emergency Medicine

## 2018-02-13 ENCOUNTER — Emergency Department (HOSPITAL_COMMUNITY): Payer: BLUE CROSS/BLUE SHIELD

## 2018-02-13 ENCOUNTER — Encounter (HOSPITAL_COMMUNITY): Payer: Self-pay

## 2018-02-13 ENCOUNTER — Other Ambulatory Visit: Payer: Self-pay

## 2018-02-13 DIAGNOSIS — E089 Diabetes mellitus due to underlying condition without complications: Secondary | ICD-10-CM

## 2018-02-13 DIAGNOSIS — L03116 Cellulitis of left lower limb: Secondary | ICD-10-CM | POA: Diagnosis not present

## 2018-02-13 DIAGNOSIS — M79672 Pain in left foot: Secondary | ICD-10-CM | POA: Diagnosis present

## 2018-02-13 DIAGNOSIS — F17210 Nicotine dependence, cigarettes, uncomplicated: Secondary | ICD-10-CM | POA: Diagnosis not present

## 2018-02-13 DIAGNOSIS — E119 Type 2 diabetes mellitus without complications: Secondary | ICD-10-CM | POA: Insufficient documentation

## 2018-02-13 DIAGNOSIS — I10 Essential (primary) hypertension: Secondary | ICD-10-CM | POA: Diagnosis not present

## 2018-02-13 DIAGNOSIS — Z7984 Long term (current) use of oral hypoglycemic drugs: Secondary | ICD-10-CM | POA: Insufficient documentation

## 2018-02-13 DIAGNOSIS — Z79899 Other long term (current) drug therapy: Secondary | ICD-10-CM | POA: Insufficient documentation

## 2018-02-13 LAB — COMPREHENSIVE METABOLIC PANEL
ALK PHOS: 103 U/L (ref 38–126)
ALT: 20 U/L (ref 14–54)
AST: 20 U/L (ref 15–41)
Albumin: 3.9 g/dL (ref 3.5–5.0)
Anion gap: 13 (ref 5–15)
BUN: 8 mg/dL (ref 6–20)
CALCIUM: 9.2 mg/dL (ref 8.9–10.3)
CO2: 23 mmol/L (ref 22–32)
CREATININE: 0.9 mg/dL (ref 0.44–1.00)
Chloride: 98 mmol/L — ABNORMAL LOW (ref 101–111)
GFR calc Af Amer: >60 mL/min (ref >60)
GLUCOSE: 356 mg/dL — AB (ref 65–99)
Potassium: 3.8 mmol/L (ref 3.5–5.1)
Sodium: 134 mmol/L — ABNORMAL LOW (ref 135–145)
TOTAL PROTEIN: 7.6 g/dL (ref 6.5–8.1)
Total Bilirubin: 0.4 mg/dL (ref 0.3–1.2)

## 2018-02-13 LAB — CBC WITH DIFFERENTIAL/PLATELET
BASOS ABS: 0 10*3/uL (ref 0.0–0.1)
Basophils Relative: 0 %
Eosinophils Absolute: 0.1 10*3/uL (ref 0.0–0.7)
Eosinophils Relative: 1 %
HEMATOCRIT: 42.1 % (ref 36.0–46.0)
HEMOGLOBIN: 14.3 g/dL (ref 12.0–15.0)
LYMPHS PCT: 35 %
Lymphs Abs: 3.6 10*3/uL (ref 0.7–4.0)
MCH: 28.8 pg (ref 26.0–34.0)
MCHC: 34 g/dL (ref 30.0–36.0)
MCV: 84.7 fL (ref 78.0–100.0)
MONO ABS: 0.9 10*3/uL (ref 0.1–1.0)
Monocytes Relative: 9 %
NEUTROS ABS: 5.7 10*3/uL (ref 1.7–7.7)
NEUTROS PCT: 55 %
Platelets: 225 10*3/uL (ref 150–400)
RBC: 4.97 MIL/uL (ref 3.87–5.11)
RDW: 15.5 % (ref 11.5–15.5)
WBC: 10.3 10*3/uL (ref 4.0–10.5)

## 2018-02-13 LAB — C-REACTIVE PROTEIN: CRP: 1.3 mg/dL — ABNORMAL HIGH (ref <1.0)

## 2018-02-13 LAB — CBG MONITORING, ED
Glucose-Capillary: 330 mg/dL — ABNORMAL HIGH (ref 65–99)
Glucose-Capillary: 259 mg/dL — ABNORMAL HIGH (ref 65–99)

## 2018-02-13 LAB — SEDIMENTATION RATE: SED RATE: 20 mm/h (ref 0–22)

## 2018-02-13 LAB — URIC ACID: Uric Acid, Serum: 3.8 mg/dL (ref 2.3–6.6)

## 2018-02-13 MED ORDER — LISINOPRIL 10 MG PO TABS: 10.0000 mg | ORAL_TABLET | Freq: Every day | ORAL | 0 refills | Status: DC

## 2018-02-13 MED ORDER — INSULIN ASPART 100 UNIT/ML ~~LOC~~ SOLN
5.0000 [IU] | Freq: Once | SUBCUTANEOUS | Status: DC
Start: 2018-02-13 — End: 2018-02-13
  Filled 2018-02-13: qty 1

## 2018-02-13 MED ORDER — METFORMIN HCL 1000 MG PO TABS: 1000.0000 mg | ORAL_TABLET | Freq: Two times a day (BID) | ORAL | 3 refills | Status: DC

## 2018-02-13 MED ORDER — LISINOPRIL 10 MG PO TABS
10.0000 mg | ORAL_TABLET | Freq: Once | ORAL | Status: AC
  Administered 2018-02-13: 10 mg via ORAL
  Filled 2018-02-13: qty 1

## 2018-02-13 MED ORDER — CLINDAMYCIN HCL 300 MG PO CAPS: 300.0000 mg | ORAL_CAPSULE | Freq: Four times a day (QID) | ORAL | 0 refills | Status: DC

## 2018-02-13 MED ORDER — CLINDAMYCIN PHOSPHATE 600 MG/50ML IV SOLN
600.0000 mg | Freq: Once | INTRAVENOUS | Status: AC
  Administered 2018-02-13: 600 mg via INTRAVENOUS
  Filled 2018-02-13: qty 50

## 2018-02-13 MED ORDER — CLONIDINE HCL 0.1 MG PO TABS
0.1000 mg | ORAL_TABLET | Freq: Once | ORAL | Status: AC
  Administered 2018-02-13: 0.1 mg via ORAL
  Filled 2018-02-13: qty 1

## 2018-02-13 NOTE — ED Triage Notes (Signed)
She reports non-traumatic swelling and pain of her left foot. She is in no distress. She tells [place] she "ran out of Metformin a long time ago, and now I don't have a primary care doctor". She is found to be hypertensive at triage. She is in no distress.

## 2018-02-13 NOTE — Discharge Instructions (Signed)
Follow-up in the emergency department in 2 days for reevaluation of the cellulitis on your left foot.  Take medications as prescribed.  Return immediately for any worsening redness, swelling, fever or chills.  Establish care with a primary physician or in the health and wellness clinic for management of your hypertension and diabetes.

## 2018-02-13 NOTE — ED Provider Notes (Signed)
[facility]-EMERGENCY DEPT Provider Note   CSN: [id number] Arrival date & time: 02/13/18  0825     History   Chief Complaint Chief Complaint  Patient presents with  . Foot Pain    HPI Ebony Matthews is a 37 y.o. female.  HPI Patient with history of diabetes and hypertension states she is been out of her medication for close to 1 year.  Presents with left foot swelling and pain that she noticed 2 days ago.  No known trauma.  States pain is worse with weightbearing.  No fever or chills.  Patient denies lightheadedness, headache, weakness, visual changes, numbness, nausea or vomiting Past Medical History:  Diagnosis Date  . Diabetes mellitus without complication (HCC)   . Hypertension     Patient Active Problem List   Diagnosis Date Noted  . Diabetes mellitus due to underlying condition without complications (HCC) 05/30/2014  . Essential hypertension, benign 05/30/2014  . Smoking 05/30/2014    Past Surgical History:  Procedure Laterality Date  . NO PAST SURGERIES       OB History    Gravida  1   Para      Term      Preterm      AB  1   Living        SAB  1   TAB      Ectopic      Multiple      Live Births               Home Medications    Prior to Admission medications   Medication Sig Start Date End Date Taking? Authorizing Provider  naproxen sodium (ALEVE) 220 MG tablet Take 440 mg by mouth 2 (two) times daily as needed (headache).   Yes [provider]  acetaminophen (TYLENOL) 325 MG tablet Take 2 tablets (650 mg total) by mouth every 6 (six) hours as needed. Patient not taking: Reported on 02/13/2018 07/29/16   Mesner, Barbara Cower, MD  atorvastatin (LIPITOR) 20 MG tablet Take 1 tablet (20 mg total) by mouth daily. Patient not taking: Reported on 02/13/2018 06/01/14   Doris Cheadle, MD  cephALEXin (KEFLEX) 500 MG capsule 2 caps po bid x 7 days Patient not taking: Reported on 02/13/2018 07/29/16   Mesner, Barbara Cower, MD    clindamycin (CLEOCIN) 300 MG capsule Take 1 capsule (300 mg total) by mouth 4 (four) times daily. X 7 days 02/13/18   Loren Racer, MD  cyclobenzaprine (FLEXERIL) 10 MG tablet Take 1 tablet (10 mg total) by mouth 2 (two) times daily as needed for muscle spasms. Patient not taking: Reported on 02/13/2018 07/29/16   Mesner, Barbara Cower, MD  glipiZIDE (GLUCOTROL) 2.5 mg TABS tablet Take 0.5 tablets (2.5 mg total) by mouth 2 (two) times daily before a meal. Patient not taking: Reported on 07/29/2016 05/30/14   Doris Cheadle, MD  HYDROcodone-acetaminophen (NORCO/VICODIN) 5-325 MG per tablet Take 1 tablet by mouth every 6 (six) hours as needed. Patient not taking: Reported on 07/29/2016 12/28/14   Roxy Horseman, PA-C  ibuprofen (ADVIL,MOTRIN) 600 MG tablet Take 1 tablet (600 mg total) by mouth every 6 (six) hours as needed. Patient not taking: Reported on 07/29/2016 02/22/14   Marny Lowenstein, PA-C  lisinopril (PRINIVIL,ZESTRIL) 10 MG tablet Take 1 tablet (10 mg total) by mouth daily. 02/13/18 03/15/18  Loren Racer, MD  metFORMIN (GLUCOPHAGE) 1000 MG tablet Take 1 tablet (1,000 mg total) by mouth 2 (two) times daily. 02/13/18   Loren Racer, MD  varenicline (CHANTIX STARTING MONTH PAK) 0.5 MG X 11 & 1 MG X 42 tablet Take one 0.5 mg tablet by mouth once daily for 3 days, then increase to one 0.5 mg tablet twice daily for 4 days, then increase to one 1 mg tablet twice daily. Patient not taking: Reported on 07/29/2016 05/30/14   Doris Cheadle, MD  Vitamin D, Ergocalciferol, (DRISDOL) 50000 UNITS CAPS capsule Take 1 capsule (50,000 Units total) by mouth every 7 (seven) days. Patient not taking: Reported on 07/29/2016 06/01/14   Doris Cheadle, MD    Family History Family History  Problem Relation Age of Onset  . Diabetes Other   . Hypertension Maternal Grandmother     Social History Social History   Tobacco Use  . Smoking status: Current Every Day Smoker    Packs/day: 1.00    Years: 15.00     Pack years: 15.00  . Smokeless tobacco: Never Used  Substance Use Topics  . Alcohol use: No  . Drug use: Yes    Types: Marijuana    Comment: occasional     Allergies   Patient has no known allergies.   Review of Systems Review of Systems  Constitutional: Negative for chills and fever.  Eyes: Negative for visual disturbance.  Respiratory: Negative for cough and shortness of breath.   Cardiovascular: Negative for chest pain.  Gastrointestinal: Negative for abdominal pain, nausea and vomiting.  Musculoskeletal: Positive for arthralgias and joint swelling.  Skin: Positive for color change. Negative for pallor, rash and wound.  Neurological: Negative for dizziness, weakness, light-headedness, numbness and headaches.  All other systems reviewed and are negative.    Physical Exam Updated Vital Signs BP (!) 164/101 (BP Location: Left Arm)   Pulse 81   Temp 98.3 F (36.8 C) (Oral)   Resp 16   LMP 01/28/2018 (Approximate)   SpO2 100%   Physical Exam  Constitutional: She is oriented to person, place, and time. She appears well-developed and well-nourished.  HENT:  Head: Normocephalic and atraumatic.  Eyes: Pupils are equal, round, and reactive to light. EOM are normal.  Neck: Normal range of motion. Neck supple.  Cardiovascular: Normal rate and regular rhythm. Exam reveals no gallop and no friction rub.  No murmur heard. Pulmonary/Chest: Effort normal and breath sounds normal. No stridor. No respiratory distress. She has no wheezes. She has no rales. She exhibits no tenderness.  Abdominal: Soft. Bowel sounds are normal. There is no tenderness. There is no rebound and no guarding.  Musculoskeletal: Normal range of motion. She exhibits tenderness. She exhibits no edema.  Patient has tenderness to palpation over the left lateral midfoot.  Surrounding erythema and warmth extending to the ankle.  2+ dorsalis pedis and posterior tibial pulses.  No calf swelling or tenderness.    Neurological: She is alert and oriented to person, place, and time.  Moves all extremities without focal deficit.  Sensation intact.  Skin: Skin is warm and dry. No rash noted. No erythema.  Psychiatric: She has a normal mood and affect. Her behavior is normal.  Nursing note and vitals reviewed.    ED Treatments / Results  Labs (all labs ordered are listed, but only abnormal results are displayed) Labs Reviewed  COMPREHENSIVE METABOLIC PANEL - Abnormal; Notable for the following components:      Result Value   Sodium 134 (*)    Chloride 98 (*)    Glucose, Bld 356 (*)    All other components within normal limits  CBG MONITORING, ED -  Abnormal; Notable for the following components:   Glucose-Capillary 330 (*)    All other components within normal limits  CBG MONITORING, ED - Abnormal; Notable for the following components:   Glucose-Capillary 259 (*)    All other components within normal limits  CBC WITH DIFFERENTIAL/PLATELET  SEDIMENTATION RATE  URIC ACID  C-REACTIVE PROTEIN    EKG None  Radiology Dg Foot Complete Left  Result Date: 02/13/2018 CLINICAL DATA:  Left foot pain and swelling over the fourth and fifth metatarsals. No trauma. EXAM: LEFT FOOT - COMPLETE 3+ VIEW COMPARISON:  None. FINDINGS: There is no evidence of fracture or dislocation. There is no evidence of arthropathy or other focal bone abnormality. Os peroneum. Tiny Achilles enthesophyte. Bone mineralization is normal. Soft tissues are unremarkable. IMPRESSION: Negative. Electronically Signed   By: Obie Dredge M.D.   On: 02/13/2018 09:37    Procedures Procedures (including critical care time)  Medications Ordered in ED Medications  insulin aspart (novoLOG) injection 5 Units (5 Units Subcutaneous Refused 02/13/18 1110)  cloNIDine (CATAPRES) tablet 0.1 mg (0.1 mg Oral Given 02/13/18 0930)  clindamycin (CLEOCIN) IVPB 600 mg (0 mg Intravenous Stopped 02/13/18 1255)  lisinopril (PRINIVIL,ZESTRIL) tablet 10  mg (10 mg Oral Given 02/13/18 1202)     Initial Impression / Assessment and Plan / ED Course  I have reviewed the triage vital signs and the nursing notes.  Pertinent labs & imaging results that were available during my care of the patient were reviewed by me and considered in my medical decision making (see chart for details).    X-ray without acute findings.  Sed rate, white blood cell count and uric acid levels are normal.  Suspect this may be an early cellulitis.  The area was marked.  Given dose of IV antibiotic and advised to return in 2 days for recheck.  She understands need to return immediately for any worsening of the redness, swelling, fever or chills.  Is also been advised of need to establish care with a primary provider regarding her chronic medical conditions of diabetes and hypertension.   Final Clinical Impressions(s) / ED Diagnoses   Final diagnoses:  Cellulitis of left foot    ED Discharge Orders        Ordered    lisinopril (PRINIVIL,ZESTRIL) 10 MG tablet  Daily     02/13/18 1326    metFORMIN (GLUCOPHAGE) 1000 MG tablet  2 times daily     02/13/18 1326    clindamycin (CLEOCIN) 300 MG capsule  4 times daily     02/13/18 1326       Loren Racer, MD 02/13/18 1327

## 2019-01-22 ENCOUNTER — Other Ambulatory Visit: Payer: Self-pay

## 2019-01-22 ENCOUNTER — Encounter (HOSPITAL_COMMUNITY): Payer: Self-pay

## 2019-01-22 ENCOUNTER — Emergency Department (HOSPITAL_COMMUNITY): Payer: Self-pay

## 2019-01-22 ENCOUNTER — Inpatient Hospital Stay (HOSPITAL_COMMUNITY)
Admission: EM | Admit: 2019-01-22 | Discharge: 2019-01-24 | DRG: 066 | Disposition: A | Discharge status: Home / Self Care | Payer: Self-pay | Attending: Internal Medicine | Admitting: Internal Medicine

## 2019-01-22 DIAGNOSIS — G8321 Monoplegia of upper limb affecting right dominant side: Secondary | ICD-10-CM | POA: Diagnosis present

## 2019-01-22 DIAGNOSIS — IMO0001 Reserved for inherently not codable concepts without codable children: Secondary | ICD-10-CM | POA: Diagnosis present

## 2019-01-22 DIAGNOSIS — Z9114 Patient's other noncompliance with medication regimen: Secondary | ICD-10-CM

## 2019-01-22 DIAGNOSIS — D72829 Elevated white blood cell count, unspecified: Secondary | ICD-10-CM | POA: Diagnosis present

## 2019-01-22 DIAGNOSIS — R29700 NIHSS score 0: Secondary | ICD-10-CM | POA: Diagnosis present

## 2019-01-22 DIAGNOSIS — I10 Essential (primary) hypertension: Secondary | ICD-10-CM | POA: Diagnosis present

## 2019-01-22 DIAGNOSIS — E1165 Type 2 diabetes mellitus with hyperglycemia: Secondary | ICD-10-CM | POA: Diagnosis present

## 2019-01-22 DIAGNOSIS — M545 Low back pain: Secondary | ICD-10-CM | POA: Diagnosis present

## 2019-01-22 DIAGNOSIS — Z8249 Family history of ischemic heart disease and other diseases of the circulatory system: Secondary | ICD-10-CM

## 2019-01-22 DIAGNOSIS — R402142 Coma scale, eyes open, spontaneous, at arrival to emergency department: Secondary | ICD-10-CM | POA: Diagnosis present

## 2019-01-22 DIAGNOSIS — R4781 Slurred speech: Secondary | ICD-10-CM | POA: Diagnosis present

## 2019-01-22 DIAGNOSIS — R402362 Coma scale, best motor response, obeys commands, at arrival to emergency department: Secondary | ICD-10-CM | POA: Diagnosis present

## 2019-01-22 DIAGNOSIS — F17200 Nicotine dependence, unspecified, uncomplicated: Secondary | ICD-10-CM | POA: Diagnosis present

## 2019-01-22 DIAGNOSIS — G8929 Other chronic pain: Secondary | ICD-10-CM | POA: Diagnosis present

## 2019-01-22 DIAGNOSIS — G459 Transient cerebral ischemic attack, unspecified: Secondary | ICD-10-CM

## 2019-01-22 DIAGNOSIS — I639 Cerebral infarction, unspecified: Secondary | ICD-10-CM

## 2019-01-22 DIAGNOSIS — E089 Diabetes mellitus due to underlying condition without complications: Secondary | ICD-10-CM | POA: Diagnosis present

## 2019-01-22 DIAGNOSIS — R402252 Coma scale, best verbal response, oriented, at arrival to emergency department: Secondary | ICD-10-CM | POA: Diagnosis present

## 2019-01-22 DIAGNOSIS — E785 Hyperlipidemia, unspecified: Secondary | ICD-10-CM | POA: Diagnosis present

## 2019-01-22 DIAGNOSIS — E876 Hypokalemia: Secondary | ICD-10-CM | POA: Diagnosis present

## 2019-01-22 DIAGNOSIS — F17210 Nicotine dependence, cigarettes, uncomplicated: Secondary | ICD-10-CM | POA: Diagnosis present

## 2019-01-22 DIAGNOSIS — Z7984 Long term (current) use of oral hypoglycemic drugs: Secondary | ICD-10-CM

## 2019-01-22 DIAGNOSIS — R29810 Facial weakness: Secondary | ICD-10-CM | POA: Diagnosis present

## 2019-01-22 DIAGNOSIS — R42 Dizziness and giddiness: Secondary | ICD-10-CM

## 2019-01-22 DIAGNOSIS — I6340 Cerebral infarction due to embolism of unspecified cerebral artery: Principal | ICD-10-CM | POA: Diagnosis present

## 2019-01-22 DIAGNOSIS — D649 Anemia, unspecified: Secondary | ICD-10-CM | POA: Diagnosis present

## 2019-01-22 HISTORY — DX: Transient cerebral ischemic attack, unspecified: G45.9

## 2019-01-22 LAB — DIFFERENTIAL
Abs Immature Granulocytes: 0 10*3/uL (ref 0.00–0.07)
Basophils Absolute: 0.2 10*3/uL — ABNORMAL HIGH (ref 0.0–0.1)
Basophils Relative: 2 %
Eosinophils Absolute: 0 10*3/uL (ref 0.0–0.5)
Eosinophils Relative: 0 %
Lymphocytes Relative: 35 %
Lymphs Abs: 4 10*3/uL (ref 0.7–4.0)
Monocytes Absolute: 0.5 10*3/uL (ref 0.1–1.0)
Monocytes Relative: 4 %
Neutro Abs: 6.7 10*3/uL (ref 1.7–7.7)
Neutrophils Relative %: 59 %
nRBC: 0 /100 WBC

## 2019-01-22 LAB — COMPREHENSIVE METABOLIC PANEL
ALT: 14 U/L (ref 0–44)
AST: 18 U/L (ref 15–41)
Albumin: 4.2 g/dL (ref 3.5–5.0)
Alkaline Phosphatase: 97 U/L (ref 38–126)
Anion gap: 13 (ref 5–15)
BUN: 10 mg/dL (ref 6–20)
CO2: 20 mmol/L — ABNORMAL LOW (ref 22–32)
Calcium: 9.4 mg/dL (ref 8.9–10.3)
Chloride: 101 mmol/L (ref 98–111)
Creatinine, Ser: 1.03 mg/dL — ABNORMAL HIGH (ref 0.44–1.00)
GFR calc Af Amer: >60 mL/min (ref >60)
GFR calc non Af Amer: >60 mL/min (ref >60)
Glucose, Bld: 270 mg/dL — ABNORMAL HIGH (ref 70–99)
Potassium: 3.5 mmol/L (ref 3.5–5.1)
Sodium: 134 mmol/L — ABNORMAL LOW (ref 135–145)
Total Bilirubin: 0.5 mg/dL (ref 0.3–1.2)
Total Protein: 8 g/dL (ref 6.5–8.1)

## 2019-01-22 LAB — CBC
HCT: 40.2 % (ref 36.0–46.0)
Hemoglobin: 12.9 g/dL (ref 12.0–15.0)
MCH: 27.4 pg (ref 26.0–34.0)
MCHC: 32.1 g/dL (ref 30.0–36.0)
MCV: 85.4 fL (ref 80.0–100.0)
Platelets: 193 10*3/uL (ref 150–400)
RBC: 4.71 MIL/uL (ref 3.87–5.11)
RDW: 14.7 % (ref 11.5–15.5)
WBC: 11.4 10*3/uL — ABNORMAL HIGH (ref 4.0–10.5)
nRBC: 0 % (ref 0.0–0.2)

## 2019-01-22 LAB — URINALYSIS, ROUTINE W REFLEX MICROSCOPIC
Bacteria, UA: NONE SEEN
Bilirubin Urine: NEGATIVE
Glucose, UA: >=500 mg/dL — AB
Hgb urine dipstick: NEGATIVE
Ketones, ur: 5 mg/dL — AB
Leukocytes,Ua: NEGATIVE
Nitrite: NEGATIVE
Protein, ur: NEGATIVE mg/dL
Specific Gravity, Urine: 1.041 — ABNORMAL HIGH (ref 1.005–1.030)
pH: 6 (ref 5.0–8.0)

## 2019-01-22 LAB — RAPID URINE DRUG SCREEN, HOSP PERFORMED
Amphetamines: NOT DETECTED
Barbiturates: NOT DETECTED
Benzodiazepines: NOT DETECTED
Cocaine: POSITIVE — AB
Opiates: NOT DETECTED
Tetrahydrocannabinol: POSITIVE — AB

## 2019-01-22 LAB — APTT: aPTT: 23 seconds — ABNORMAL LOW (ref 24–36)

## 2019-01-22 LAB — I-STAT CREATININE, ED: Creatinine, Ser: 0.9 mg/dL (ref 0.44–1.00)

## 2019-01-22 LAB — ETHANOL: Alcohol, Ethyl (B): <10 mg/dL (ref <10)

## 2019-01-22 LAB — PROTIME-INR
INR: 1 (ref 0.8–1.2)
Prothrombin Time: 13.4 seconds (ref 11.4–15.2)

## 2019-01-22 LAB — CBG MONITORING, ED
Glucose-Capillary: 242 mg/dL — ABNORMAL HIGH (ref 70–99)
Glucose-Capillary: 312 mg/dL — ABNORMAL HIGH (ref 70–99)

## 2019-01-22 LAB — I-STAT BETA HCG BLOOD, ED (MC, WL, AP ONLY): I-stat hCG, quantitative: <5 m[IU]/mL (ref <5)

## 2019-01-22 MED ORDER — NICOTINE 21 MG/24HR TD PT24
21.0000 mg | MEDICATED_PATCH | Freq: Every day | TRANSDERMAL | Status: DC | PRN
  Administered 2019-01-23 – 2019-01-24 (×3): 21 mg via TRANSDERMAL
  Filled 2019-01-22 (×3): qty 1

## 2019-01-22 MED ORDER — IOHEXOL 350 MG/ML SOLN
75.0000 mL | Freq: Once | INTRAVENOUS | Status: AC | PRN
  Administered 2019-01-22: 75 mL via INTRAVENOUS

## 2019-01-22 MED ORDER — HYDRALAZINE HCL 20 MG/ML IJ SOLN: 10.0000 mg | Freq: Four times a day (QID) | INTRAMUSCULAR | Status: DC | PRN

## 2019-01-22 MED ORDER — INSULIN ASPART 100 UNIT/ML ~~LOC~~ SOLN
0.0000 [IU] | Freq: Every day | SUBCUTANEOUS | Status: DC
  Administered 2019-01-22: 4 [IU] via SUBCUTANEOUS

## 2019-01-22 NOTE — ED Provider Notes (Signed)
[facility]   CSN: [id number] Arrival date & time: 01/22/19  1923    History   Chief Complaint Chief Complaint  Patient presents with   Near Syncope   Numbness    HPI Avis EpleyRuby Matthews is a 38 y.o. female.     The history is provided by the patient and medical records. No language interpreter was used.  Neurologic Problem  This is a new problem. The current episode started 1 to 2 hours ago. The problem occurs constantly. The problem has been rapidly improving. Pertinent negatives include no chest pain, no abdominal pain, no headaches and no shortness of breath. Nothing aggravates the symptoms. Nothing relieves the symptoms. She has tried nothing for the symptoms. The treatment provided no relief.    Past Medical History:  Diagnosis Date   Diabetes mellitus without complication (HCC)    Hypertension     Patient Active Problem List   Diagnosis Date Noted   Diabetes mellitus due to underlying condition without complications (HCC) 05/30/2014   Essential hypertension, benign 05/30/2014   Smoking 05/30/2014    Past Surgical History:  Procedure Laterality Date   NO PAST SURGERIES       OB History    Gravida  1   Para      Term      Preterm      AB  1   Living        SAB  1   TAB      Ectopic      Multiple      Live Births               Home Medications    Prior to Admission medications   Medication Sig Start Date End Date Taking? Authorizing Provider  acetaminophen (TYLENOL) 325 MG tablet Take 2 tablets (650 mg total) by mouth every 6 (six) hours as needed. Patient not taking: Reported on 02/13/2018 07/29/16   Mesner, Barbara CowerJason, MD  atorvastatin (LIPITOR) 20 MG tablet Take 1 tablet (20 mg total) by mouth daily. Patient not taking: Reported on 02/13/2018 06/01/14   Doris CheadleAdvani, Deepak, MD  cephALEXin (KEFLEX) 500 MG capsule 2 caps po bid x 7 days Patient not taking: Reported on 02/13/2018 07/29/16    Mesner, Barbara CowerJason, MD  clindamycin (CLEOCIN) 300 MG capsule Take 1 capsule (300 mg total) by mouth 4 (four) times daily. X 7 days 02/13/18   Loren RacerYelverton, David, MD  cyclobenzaprine (FLEXERIL) 10 MG tablet Take 1 tablet (10 mg total) by mouth 2 (two) times daily as needed for muscle spasms. Patient not taking: Reported on 02/13/2018 07/29/16   Mesner, Barbara CowerJason, MD  glipiZIDE (GLUCOTROL) 2.5 mg TABS tablet Take 0.5 tablets (2.5 mg total) by mouth 2 (two) times daily before a meal. Patient not taking: Reported on 07/29/2016 05/30/14   Doris CheadleAdvani, Deepak, MD  HYDROcodone-acetaminophen (NORCO/VICODIN) 5-325 MG per tablet Take 1 tablet by mouth every 6 (six) hours as needed. Patient not taking: Reported on 07/29/2016 12/28/14   Roxy HorsemanBrowning, Robert, PA-C  ibuprofen (ADVIL,MOTRIN) 600 MG tablet Take 1 tablet (600 mg total) by mouth every 6 (six) hours as needed. Patient not taking: Reported on 07/29/2016 02/22/14   Marny LowensteinWenzel, Julie N, PA-C  lisinopril (PRINIVIL,ZESTRIL) 10 MG tablet Take 1 tablet (10 mg total) by mouth daily. 02/13/18 03/15/18  Loren RacerYelverton, David, MD  metFORMIN (GLUCOPHAGE) 1000 MG tablet Take 1 tablet (1,000 mg total) by mouth 2 (two) times daily. 02/13/18   Loren RacerYelverton, David, MD  naproxen sodium (ALEVE) 220 MG tablet Take 440 mg by mouth 2 (two) times daily as needed (headache).    [provider]  varenicline (CHANTIX STARTING MONTH PAK) 0.5 MG X 11 & 1 MG X 42 tablet Take one 0.5 mg tablet by mouth once daily for 3 days, then increase to one 0.5 mg tablet twice daily for 4 days, then increase to one 1 mg tablet twice daily. Patient not taking: Reported on 07/29/2016 05/30/14   Doris Cheadle, MD  Vitamin D, Ergocalciferol, (DRISDOL) 50000 UNITS CAPS capsule Take 1 capsule (50,000 Units total) by mouth every 7 (seven) days. Patient not taking: Reported on 07/29/2016 06/01/14   Doris Cheadle, MD    Family History Family History  Problem Relation Age of Onset   Diabetes Other    Hypertension Maternal  Grandmother     Social History Social History   Tobacco Use   Smoking status: Current Every Day Smoker    Packs/day: 1.00    Years: 15.00    Pack years: 15.00   Smokeless tobacco: Never Used  Substance Use Topics   Alcohol use: No   Drug use: Yes    Types: Marijuana    Comment: occasional     Allergies   Patient has no known allergies.   Review of Systems Review of Systems  Constitutional: Negative for chills, diaphoresis, fatigue and fever.  HENT: Negative for congestion.   Eyes: Negative for photophobia and visual disturbance.  Respiratory: Negative for shortness of breath.   Cardiovascular: Negative for chest pain.  Gastrointestinal: Negative for abdominal pain, constipation, diarrhea, nausea and vomiting.  Genitourinary: Negative for flank pain.  Musculoskeletal: Negative for back pain, neck pain and neck stiffness.  Skin: Negative for wound.  Neurological: Positive for facial asymmetry, speech difficulty, weakness, light-headedness and numbness. Negative for dizziness, seizures, syncope and headaches.  Psychiatric/Behavioral: Negative for agitation and confusion.  All other systems reviewed and are negative.    Physical Exam Updated Vital Signs BP (!) 153/103    Pulse 89    Resp 18    SpO2 99%   Physical Exam Vitals signs and nursing note reviewed.  Constitutional:      General: She is not in acute distress.    Appearance: She is well-developed. She is not ill-appearing, toxic-appearing or diaphoretic.  HENT:     Head: Normocephalic and atraumatic.     Nose: No congestion or rhinorrhea.     Mouth/Throat:     Mouth: Mucous membranes are moist.     Pharynx: No oropharyngeal exudate or posterior oropharyngeal erythema.  Eyes:     Extraocular Movements: Extraocular movements intact.     Conjunctiva/sclera: Conjunctivae normal.     Pupils: Pupils are equal, round, and reactive to light.  Neck:     Musculoskeletal: Neck supple. No muscular tenderness.    Cardiovascular:     Rate and Rhythm: Normal rate and regular rhythm.     Pulses: Normal pulses.     Heart sounds: No murmur.  Pulmonary:     Effort: Pulmonary effort is normal. No respiratory distress.     Breath sounds: Normal breath sounds. No wheezing, rhonchi or rales.  Chest:     Chest wall: No tenderness.  Abdominal:     General: Abdomen is flat.     Palpations: Abdomen is soft.     Tenderness: There is no abdominal tenderness. There is no right CVA tenderness or left CVA tenderness.  Musculoskeletal:  General: No swelling or tenderness.     Right lower leg: No edema.     Left lower leg: No edema.  Skin:    General: Skin is warm and dry.     Capillary Refill: Capillary refill takes less than 2 seconds.     Findings: No erythema or lesion.  Neurological:     Mental Status: She is alert and oriented to person, place, and time.     GCS: GCS eye subscore is 4. GCS verbal subscore is 5. GCS motor subscore is 6.     Cranial Nerves: No cranial nerve deficit.     Sensory: Sensory deficit present.     Motor: No weakness, tremor, abnormal muscle tone, seizure activity or pronator drift.     Coordination: Finger-Nose-Finger Test normal.     Comments: Only focal neurologic deficit was some subjective mild right fingertip tingling/numbness.  Normal grip strength, sensation and rest of arm, pulses, finger-nose-finger testing, and no facial droop.  No sensation difference in her legs or face.  Clear speech.  Psychiatric:        Mood and Affect: Mood normal.      ED Treatments / Results  Labs (all labs ordered are listed, but only abnormal results are displayed) Labs Reviewed  APTT - Abnormal; Notable for the following components:      Result Value   aPTT 23 (*)    All other components within normal limits  CBC - Abnormal; Notable for the following components:   WBC 11.4 (*)    All other components within normal limits  DIFFERENTIAL - Abnormal; Notable for the following  components:   Basophils Absolute 0.2 (*)    All other components within normal limits  COMPREHENSIVE METABOLIC PANEL - Abnormal; Notable for the following components:   Sodium 134 (*)    CO2 20 (*)    Glucose, Bld 270 (*)    Creatinine, Ser 1.03 (*)    All other components within normal limits  RAPID URINE DRUG SCREEN, HOSP PERFORMED - Abnormal; Notable for the following components:   Cocaine POSITIVE (*)    Tetrahydrocannabinol POSITIVE (*)    All other components within normal limits  URINALYSIS, ROUTINE W REFLEX MICROSCOPIC - Abnormal; Notable for the following components:   APPearance HAZY (*)    Specific Gravity, Urine 1.041 (*)    Glucose, UA >=500 (*)    Ketones, ur 5 (*)    All other components within normal limits  CBG MONITORING, ED - Abnormal; Notable for the following components:   Glucose-Capillary 242 (*)    All other components within normal limits  CBG MONITORING, ED - Abnormal; Notable for the following components:   Glucose-Capillary 312 (*)    All other components within normal limits  ETHANOL  PROTIME-INR  HEMOGLOBIN A1C  LIPID PANEL  TSH  I-STAT CREATININE, ED  I-STAT BETA HCG BLOOD, ED ([facility], [facility], [facility] ONLY)    EKG EKG Interpretation  Date/Time:  Saturday January 22 2019 19:42:37 EDT Ventricular Rate:  90 PR Interval:    QRS Duration: 67 QT Interval:  342 QTC Calculation: 419 R Axis:   49 Text Interpretation:  Sinus rhythm Probable anteroseptal infarct, recent When compared to prior, slower rate.  Similar ST abnormalities.  No STEMI Confirmed by Theda Belfast ([id number]) on 01/22/2019 7:44:54 PM   Radiology Ct Angio Head W Or Wo Contrast  Result Date: 01/22/2019 CLINICAL DATA:  Right facial droop, slurred speech, right arm numbness. Diabetes and hypertension EXAM: CT  ANGIOGRAPHY HEAD AND NECK TECHNIQUE: Multidetector CT imaging of the head and neck was performed using the standard protocol during bolus administration of intravenous contrast. Multiplanar CT  image reconstructions and MIPs were obtained to evaluate the vascular anatomy. Carotid stenosis measurements (when applicable) are obtained utilizing NASCET criteria, using the distal internal carotid diameter as the denominator. CONTRAST:  75mL OMNIPAQUE IOHEXOL 350 MG/ML SOLN COMPARISON:  None. FINDINGS: CT HEAD FINDINGS Brain: No evidence of acute infarction, hemorrhage, hydrocephalus, extra-axial collection or mass lesion/mass effect. Vascular: Negative for hyperdense vessel Skull: Negative Sinuses: Negative Orbits: Negative Review of the MIP images confirms the above findings CTA NECK FINDINGS Aortic arch: Standard branching. Imaged portion shows no evidence of aneurysm or dissection. No significant stenosis of the major arch vessel origins. Right carotid system: Right carotid bifurcation widely patent. Mild fusiform dilatation of the distal right internal carotid artery below the skull base with mild irregularity, question mild FMD. No significant stenosis Left carotid system: Similar findings to the right carotid with mild fusiform dilatation of the internal carotid artery below the skull base. Mild narrowing at the origin of the left internal carotid artery. No calcification or aneurysm. No significant stenosis. Vertebral arteries: Both vertebral arteries widely patent to the basilar without stenosis. Skeleton: No acute skeletal abnormality. Poor dentition with numerous caries. Other neck: Negative for soft tissue mass or adenopathy. Upper chest: Negative Review of the MIP images confirms the above findings CTA HEAD FINDINGS Anterior circulation: Mild narrowing of the right cavernous carotid compared to the right internal carotid artery below the skull base. Right anterior and middle cerebral arteries widely patent Diffuse narrowing left cavernous carotid with mild-to-moderate focal stenosis proximal cavernous carotid. Left anterior and middle cerebral arteries widely patent. Posterior circulation: Both  vertebral arteries patent to the basilar. PICA patent bilaterally. Basilar widely patent. Superior cerebellar and posterior cerebral arteries widely patent. Venous sinuses: Patent Anatomic variants: None Delayed phase: Normal enhancement postcontrast administration. Review of the MIP images confirms the above findings IMPRESSION: 1. Negative CT head 2. Negative for emergent large vessel occlusion 3. Mild fusiform dilatation of the internal carotid artery below the skull base with subsequent narrowing to the cavernous carotid bilaterally. Question FMD. No intracranial aneurysm. 4. These results were called by telephone at the time of interpretation on 01/22/2019 at 9:36 pm to Dr. Caryl Pina , who verbally acknowledged these results. Electronically Signed   By: Marlan Palau M.D.   On: 01/22/2019 21:36   Ct Angio Neck W Or Wo Contrast  Result Date: 01/22/2019 CLINICAL DATA:  Right facial droop, slurred speech, right arm numbness. Diabetes and hypertension EXAM: CT ANGIOGRAPHY HEAD AND NECK TECHNIQUE: Multidetector CT imaging of the head and neck was performed using the standard protocol during bolus administration of intravenous contrast. Multiplanar CT image reconstructions and MIPs were obtained to evaluate the vascular anatomy. Carotid stenosis measurements (when applicable) are obtained utilizing NASCET criteria, using the distal internal carotid diameter as the denominator. CONTRAST:  75mL OMNIPAQUE IOHEXOL 350 MG/ML SOLN COMPARISON:  None. FINDINGS: CT HEAD FINDINGS Brain: No evidence of acute infarction, hemorrhage, hydrocephalus, extra-axial collection or mass lesion/mass effect. Vascular: Negative for hyperdense vessel Skull: Negative Sinuses: Negative Orbits: Negative Review of the MIP images confirms the above findings CTA NECK FINDINGS Aortic arch: Standard branching. Imaged portion shows no evidence of aneurysm or dissection. No significant stenosis of the major arch vessel origins. Right carotid  system: Right carotid bifurcation widely patent. Mild fusiform dilatation of the distal right internal carotid artery  below the skull base with mild irregularity, question mild FMD. No significant stenosis Left carotid system: Similar findings to the right carotid with mild fusiform dilatation of the internal carotid artery below the skull base. Mild narrowing at the origin of the left internal carotid artery. No calcification or aneurysm. No significant stenosis. Vertebral arteries: Both vertebral arteries widely patent to the basilar without stenosis. Skeleton: No acute skeletal abnormality. Poor dentition with numerous caries. Other neck: Negative for soft tissue mass or adenopathy. Upper chest: Negative Review of the MIP images confirms the above findings CTA HEAD FINDINGS Anterior circulation: Mild narrowing of the right cavernous carotid compared to the right internal carotid artery below the skull base. Right anterior and middle cerebral arteries widely patent Diffuse narrowing left cavernous carotid with mild-to-moderate focal stenosis proximal cavernous carotid. Left anterior and middle cerebral arteries widely patent. Posterior circulation: Both vertebral arteries patent to the basilar. PICA patent bilaterally. Basilar widely patent. Superior cerebellar and posterior cerebral arteries widely patent. Venous sinuses: Patent Anatomic variants: None Delayed phase: Normal enhancement postcontrast administration. Review of the MIP images confirms the above findings IMPRESSION: 1. Negative CT head 2. Negative for emergent large vessel occlusion 3. Mild fusiform dilatation of the internal carotid artery below the skull base with subsequent narrowing to the cavernous carotid bilaterally. Question FMD. No intracranial aneurysm. 4. These results were called by telephone at the time of interpretation on 01/22/2019 at 9:36 pm to Dr. Caryl Pina , who verbally acknowledged these results. Electronically Signed   By:  Marlan Palau M.D.   On: 01/22/2019 21:36    Procedures Procedures (including critical care time)  Medications Ordered in ED Medications  insulin aspart (novoLOG) injection 0-5 Units (4 Units Subcutaneous Given 01/22/19 2351)  nicotine (NICODERM CQ - dosed in mg/24 hours) patch 21 mg (has no administration in time range)  hydrALAZINE (APRESOLINE) injection 10 mg (has no administration in time range)  iohexol (OMNIPAQUE) 350 MG/ML injection 75 mL (75 mLs Intravenous Contrast Given 01/22/19 2055)     Initial Impression / Assessment and Plan / ED Course  I have reviewed the triage vital signs and the nursing notes.  Pertinent labs & imaging results that were available during my care of the patient were reviewed by me and considered in my medical decision making (see chart for details).        Ebony Matthews is a 38 y.o. female with a past medical history significant for hypertension and diabetes who presents with a urinary syncopal episode with lightheadedness and associated transient right facial numbness, right arm numbness, right arm weakness, and difficulty speaking.  Patient reports that she had her symptoms began at 7 PM while at work.  Patient reports that she initially felt very lightheaded then had sensation of her right face drooping and feeling numb.  Her right arm and felt weak and numb and she could not touch the buttons correctly on the touch screen.  She says that she had slurred speech and could not get the words out that she wanted.  She denies any vision changes or significant headaches.  She denied associated chest pain, palpitations or shortness of breath.  She was feeling completely normal before her symptoms began.  She reports the symptoms last between 5 and 10 minutes and then began to improve.  She reports the only symptom she currently has is very mild fingertip tingling/numbness.  All speech difficulty and the weakness have resolved.  She denies other  complaints.  On exam, patient's  speech was clear.  Normal extraocular movements and no facial droop.  Normal sensation in face and upper arm.  Patient reported subjective tingling on the right fingertips.  No weakness with grip or arm strength.  No pronator drift.  Normal finger-nose-finger testing bilaterally.  Normal sensation and strength in legs.  Lungs clear and chest nontender.  Abdomen nontender.  Exam otherwise unremarkable.  Neurology quickly called and they will come see patient.  As symptoms have nearly resolved, suspect more TIA than stroke at this time.  Patient will have glucose checked and CT head ordered.  Anticipate reassessment.  10:45 PM Neurology saw patient and feel she likely had a TIA.  They recommended CTA of the head and neck for evaluation.  CTA did not show acute stroke.  Neurology feels that this is a TIA and she needs to be admitted for MRI and further stroke/TIA work-up.  Her other laboratory testing showed hyperglycemia and mild leukocytosis but was otherwise reassuring.  Hospital stay will be called for admission for work-up.   Final Clinical Impressions(s) / ED Diagnoses   Final diagnoses:  TIA (transient ischemic attack)    ED Discharge Orders    None      Clinical Impression: 1. TIA (transient ischemic attack)     Disposition: Admit  This note was prepared with assistance of Dragon voice recognition software. Occasional wrong-word or sound-a-like substitutions may have occurred due to the inherent limitations of voice recognition software.         Lessie Manigo, Canary Brim, MD 01/22/19 2352

## 2019-01-22 NOTE — H&P (Signed)
TRH H&P    Patient Demographics:    Ebony Matthews, is a 38 y.o. female  MRN: [id number]  DOB - [birth year]  Admit Date - 01/22/2019  Referring MD/NP/PA: Scotty Court  Outpatient Primary MD for the patient is Patient, No Pcp Per  Patient coming from:   work  Chief complaint-   TIA    HPI:    Ebony Matthews  is a 38 y.o. female,w hypertension, dm2, who presents with c/o difficulty with speech, right facial droop, dizziness (no vertigo),  as well as right upper extremity weakness and numbness, starting about 6:30pm while at [employer].  Pt states that her symptoms lasted about 5-10 minutes and disappeared.  Pt denies fever, chills, cough, cp, palp, sob, n/v, abd pain, diarrhea, brbpr, dysuria.  Pt notes she has been noncompliant with her metformin as well as not taking her bp or cholesterol medication.  Pt does not take aspirin at baseline.   In ED, T afebrile  P 89  R 18, Bp 153/103  Pox 99% on RA  CTA brain/ neck IMPRESSION: 1. Negative CT head 2. Negative for emergent large vessel occlusion 3. Mild fusiform dilatation of the internal carotid artery below the skull base with subsequent narrowing to the cavernous carotid bilaterally. Question FMD. No intracranial aneurysm.  INR 1.0 Wbc 11.4, hgb 12.9, Plt 193 Na 134, K 3.5   Bun 10, Creatiine 1.03 Glucose 270 Ast 18, Alt 14  Pt evaluated by neurology in the ED, symptoms c/w TIA Pt will be admitted for TIA    Review of systems:    In addition to the HPI above,  No Fever-chills, No Headache, No changes with Vision or hearing, No problems swallowing food or Liquids, No Chest pain, Cough or Shortness of Breath, No Abdominal pain, No Nausea or Vomiting, bowel movements are regular, No Blood in stool or Urine, No dysuria, No new skin rashes or bruises, No new joints pains-aches,   No recent weight gain or loss, No polyuria, polydypsia or  polyphagia, No significant Mental Stressors.  All other systems reviewed and are negative.    Past History of the following :    Past Medical History:  Diagnosis Date   Diabetes mellitus without complication (HCC)    Hypertension       Past Surgical History:  Procedure Laterality Date   NO PAST SURGERIES        Social History:      Social History   Tobacco Use   Smoking status: Current Every Day Smoker    Packs/day: 1.00    Years: 15.00    Pack years: 15.00   Smokeless tobacco: Never Used  Substance Use Topics   Alcohol use: No       Family History :     Family History  Problem Relation Age of Onset   Diabetes Other    Hypertension Maternal Grandmother        Home Medications:   Prior to Admission medications   Medication Sig Start Date End Date Taking? Authorizing Provider  naproxen  sodium (ALEVE) 220 MG tablet Take 440 mg by mouth 2 (two) times daily as needed (headache).   Yes [provider]  acetaminophen (TYLENOL) 325 MG tablet Take 2 tablets (650 mg total) by mouth every 6 (six) hours as needed. Patient not taking: Reported on 02/13/2018 07/29/16   Mesner, Barbara Cower, MD  atorvastatin (LIPITOR) 20 MG tablet Take 1 tablet (20 mg total) by mouth daily. Patient not taking: Reported on 02/13/2018 06/01/14   Doris Cheadle, MD  cephALEXin (KEFLEX) 500 MG capsule 2 caps po bid x 7 days Patient not taking: Reported on 02/13/2018 07/29/16   Mesner, Barbara Cower, MD  clindamycin (CLEOCIN) 300 MG capsule Take 1 capsule (300 mg total) by mouth 4 (four) times daily. X 7 days Patient not taking: Reported on 01/22/2019 02/13/18   Loren Racer, MD  cyclobenzaprine (FLEXERIL) 10 MG tablet Take 1 tablet (10 mg total) by mouth 2 (two) times daily as needed for muscle spasms. Patient not taking: Reported on 02/13/2018 07/29/16   Mesner, Barbara Cower, MD  glipiZIDE (GLUCOTROL) 2.5 mg TABS tablet Take 0.5 tablets (2.5 mg total) by mouth 2 (two) times daily before a  meal. Patient not taking: Reported on 07/29/2016 05/30/14   Doris Cheadle, MD  HYDROcodone-acetaminophen (NORCO/VICODIN) 5-325 MG per tablet Take 1 tablet by mouth every 6 (six) hours as needed. Patient not taking: Reported on 07/29/2016 12/28/14   Roxy Horseman, PA-C  ibuprofen (ADVIL,MOTRIN) 600 MG tablet Take 1 tablet (600 mg total) by mouth every 6 (six) hours as needed. Patient not taking: Reported on 07/29/2016 02/22/14   Marny Lowenstein, PA-C  lisinopril (PRINIVIL,ZESTRIL) 10 MG tablet Take 1 tablet (10 mg total) by mouth daily. Patient not taking: Reported on 01/22/2019 02/13/18 03/15/18  Loren Racer, MD  metFORMIN (GLUCOPHAGE) 1000 MG tablet Take 1 tablet (1,000 mg total) by mouth 2 (two) times daily. Patient not taking: Reported on 01/22/2019 02/13/18   Loren Racer, MD  varenicline (CHANTIX STARTING MONTH PAK) 0.5 MG X 11 & 1 MG X 42 tablet Take one 0.5 mg tablet by mouth once daily for 3 days, then increase to one 0.5 mg tablet twice daily for 4 days, then increase to one 1 mg tablet twice daily. Patient not taking: Reported on 07/29/2016 05/30/14   Doris Cheadle, MD  Vitamin D, Ergocalciferol, (DRISDOL) 50000 UNITS CAPS capsule Take 1 capsule (50,000 Units total) by mouth every 7 (seven) days. Patient not taking: Reported on 07/29/2016 06/01/14   Doris Cheadle, MD     Allergies:    No Known Allergies   Physical Exam:   Vitals  Blood pressure (!) 153/103, pulse 89, resp. rate 18, SpO2 99 %.  1.  General: axox3  2. Psychiatric: euthymic  3. Neurologic: cn2-12 intact, reflexes 2+ symmetric, diffuse with downgoing toes bilaterally, motor 5/5 in all 4 ext, pinprik intact, no pronator drift, good finger to nose  4. HEENMT:  Anicteric, pupils 1.72mm symmetric, direct, consensual, near intact, eomi, visual field intact, tongue midline  5. Respiratory : CTAB  6. Cardiovascular : rrr s1, s2, no m/g/r  7. Gastrointestinal:  Abd: soft, nt, nd, +bs  8. Skin:  Ext: no  c/c/e, no rash  9.Musculoskeletal:  Good ROM,   No adenopathy    Data Review:    CBC Recent Labs  Lab 01/22/19 2011  WBC 11.4*  HGB 12.9  HCT 40.2  PLT 193  MCV 85.4  MCH 27.4  MCHC 32.1  RDW 14.7  LYMPHSABS 4.0  MONOABS 0.5  EOSABS 0.0  BASOSABS 0.2*   ------------------------------------------------------------------------------------------------------------------  Results for orders placed or performed during the hospital encounter of 01/22/19 (from the past 48 hour(s))  CBG monitoring, ED     Status: Abnormal   Collection Time: 01/22/19  7:56 PM  Result Value Ref Range   Glucose-Capillary 242 (H) 70 - 99 mg/dL  Ethanol     Status: None   Collection Time: 01/22/19  8:11 PM  Result Value Ref Range   Alcohol, Ethyl (B) <10 <10 mg/dL    Comment: (NOTE) Lowest detectable limit for serum alcohol is 10 mg/dL. For medical purposes only. Performed at [facility], [address] [place], [place] [address]   Protime-INR     Status: None   Collection Time: 01/22/19  8:11 PM  Result Value Ref Range   Prothrombin Time 13.4 11.4 - 15.2 seconds   INR 1.0 0.8 - 1.2    Comment: (NOTE) INR goal varies based on device and disease states. Performed at [facility], [address], [place], [place] [address]   APTT     Status: Abnormal   Collection Time: 01/22/19  8:11 PM  Result Value Ref Range   aPTT 23 (L) 24 - 36 seconds    Comment: Performed at [facility], [address], [place], [place] [address]  CBC     Status: Abnormal   Collection Time: 01/22/19  8:11 PM  Result Value Ref Range   WBC 11.4 (H) 4.0 - 10.5 K/uL   RBC 4.71 3.87 - 5.11 MIL/uL   Hemoglobin 12.9 12.0 - 15.0 g/dL   HCT 91.4 78.2 - 95.6 %   MCV 85.4 80.0 - 100.0 fL   MCH 27.4 26.0 - 34.0 pg   MCHC 32.1 30.0 - 36.0 g/dL   RDW 21.3 08.6 - 57.8 %   Platelets 193 150 - 400 K/uL   nRBC 0.0 0.0 - 0.2 %    Comment: Performed at [facility], [address],  [place], [place] [address]  Differential     Status: Abnormal   Collection Time: 01/22/19  8:11 PM  Result Value Ref Range   Neutrophils Relative % 59 %   Neutro Abs 6.7 1.7 - 7.7 K/uL   Lymphocytes Relative 35 %   Lymphs Abs 4.0 0.7 - 4.0 K/uL   Monocytes Relative 4 %   Monocytes Absolute 0.5 0.1 - 1.0 K/uL   Eosinophils Relative 0 %   Eosinophils Absolute 0.0 0.0 - 0.5 K/uL   Basophils Relative 2 %   Basophils Absolute 0.2 (H) 0.0 - 0.1 K/uL   nRBC 0 0 /100 WBC   Abs Immature Granulocytes 0.00 0.00 - 0.07 K/uL    Comment: Performed at [facility], [address], [place], [place] [address]  Comprehensive metabolic panel     Status: Abnormal   Collection Time: 01/22/19  8:11 PM  Result Value Ref Range   Sodium 134 (L) 135 - 145 mmol/L   Potassium 3.5 3.5 - 5.1 mmol/L   Chloride 101 98 - 111 mmol/L   CO2 20 (L) 22 - 32 mmol/L   Glucose, Bld 270 (H) 70 - 99 mg/dL   BUN 10 6 - 20 mg/dL   Creatinine, Ser 1.32 (H) 0.44 - 1.00 mg/dL   Calcium 9.4 8.9 - 44.0 mg/dL   Total Protein 8.0 6.5 - 8.1 g/dL   Albumin 4.2 3.5 - 5.0 g/dL   AST 18 15 - 41 U/L  ALT 14 0 - 44 U/L   Alkaline Phosphatase 97 38 - 126 U/L   Total Bilirubin 0.5 0.3 - 1.2 mg/dL   GFR calc non Af Amer >60 >60 mL/min   GFR calc Af Amer >60 >60 mL/min   Anion gap 13 5 - 15    Comment: Performed at [facility], [address] [place], [place] [address]  I-Stat beta hCG blood, ED     Status: None   Collection Time: 01/22/19  8:14 PM  Result Value Ref Range   I-stat hCG, quantitative <5.0 <5 mIU/mL   Comment 3            Comment:   GEST. AGE      CONC.  (mIU/mL)   <=1 WEEK        5 - 50     2 WEEKS       50 - 500     3 WEEKS       100 - 10,000     4 WEEKS     1,000 - 30,000        FEMALE AND NON-PREGNANT FEMALE:     LESS THAN 5 mIU/mL   I-stat Creatinine, ED     Status: None   Collection Time: 01/22/19  8:15 PM  Result Value Ref Range   Creatinine, Ser 0.90 0.44 - 1.00 mg/dL    Chemistries  Recent  Labs  Lab 01/22/19 2011 01/22/19 2015  NA 134*  --   K 3.5  --   CL 101  --   CO2 20*  --   GLUCOSE 270*  --   BUN 10  --   CREATININE 1.03* 0.90  CALCIUM 9.4  --   AST 18  --   ALT 14  --   ALKPHOS 97  --   BILITOT 0.5  --    ------------------------------------------------------------------------------------------------------------------  ------------------------------------------------------------------------------------------------------------------ GFR: CrCl cannot be calculated (Unknown ideal weight.). Liver Function Tests: Recent Labs  Lab 01/22/19 2011  AST 18  ALT 14  ALKPHOS 97  BILITOT 0.5  PROT 8.0  ALBUMIN 4.2   No results for input(s): LIPASE, AMYLASE in the last 168 hours. No results for input(s): AMMONIA in the last 168 hours. Coagulation Profile: Recent Labs  Lab 01/22/19 2011  INR 1.0   Cardiac Enzymes: No results for input(s): CKTOTAL, CKMB, CKMBINDEX, TROPONINI in the last 168 hours. BNP (last 3 results) No results for input(s): PROBNP in the last 8760 hours. HbA1C: No results for input(s): HGBA1C in the last 72 hours. CBG: Recent Labs  Lab 01/22/19 1956  GLUCAP 242*   Lipid Profile: No results for input(s): CHOL, HDL, LDLCALC, TRIG, CHOLHDL, LDLDIRECT in the last 72 hours. Thyroid Function Tests: No results for input(s): TSH, T4TOTAL, FREET4, T3FREE, THYROIDAB in the last 72 hours. Anemia Panel: No results for input(s): VITAMINB12, FOLATE, FERRITIN, TIBC, IRON, RETICCTPCT in the last 72 hours.  --------------------------------------------------------------------------------------------------------------- Urine analysis:    Component Value Date/Time   COLORURINE AMBER (A) 07/29/2016 1230   APPEARANCEUR TURBID (A) 07/29/2016 1230   LABSPEC 1.025 07/29/2016 1230   PHURINE 6.0 07/29/2016 1230   GLUCOSEU 250 (A) 07/29/2016 1230   HGBUR NEGATIVE 07/29/2016 1230   BILIRUBINUR NEGATIVE 07/29/2016 1230   KETONESUR NEGATIVE 07/29/2016  1230   PROTEINUR NEGATIVE 07/29/2016 1230   UROBILINOGEN 0.2 02/22/2014 1730   NITRITE POSITIVE (A) 07/29/2016 1230   LEUKOCYTESUR SMALL (A) 07/29/2016 1230      Imaging Results:    Ct  Angio Head W Or Wo Contrast  Result Date: 01/22/2019 CLINICAL DATA:  Right facial droop, slurred speech, right arm numbness. Diabetes and hypertension EXAM: CT ANGIOGRAPHY HEAD AND NECK TECHNIQUE: Multidetector CT imaging of the head and neck was performed using the standard protocol during bolus administration of intravenous contrast. Multiplanar CT image reconstructions and MIPs were obtained to evaluate the vascular anatomy. Carotid stenosis measurements (when applicable) are obtained utilizing NASCET criteria, using the distal internal carotid diameter as the denominator. CONTRAST:  75mL OMNIPAQUE IOHEXOL 350 MG/ML SOLN COMPARISON:  None. FINDINGS: CT HEAD FINDINGS Brain: No evidence of acute infarction, hemorrhage, hydrocephalus, extra-axial collection or mass lesion/mass effect. Vascular: Negative for hyperdense vessel Skull: Negative Sinuses: Negative Orbits: Negative Review of the MIP images confirms the above findings CTA NECK FINDINGS Aortic arch: Standard branching. Imaged portion shows no evidence of aneurysm or dissection. No significant stenosis of the major arch vessel origins. Right carotid system: Right carotid bifurcation widely patent. Mild fusiform dilatation of the distal right internal carotid artery below the skull base with mild irregularity, question mild FMD. No significant stenosis Left carotid system: Similar findings to the right carotid with mild fusiform dilatation of the internal carotid artery below the skull base. Mild narrowing at the origin of the left internal carotid artery. No calcification or aneurysm. No significant stenosis. Vertebral arteries: Both vertebral arteries widely patent to the basilar without stenosis. Skeleton: No acute skeletal abnormality. Poor dentition with  numerous caries. Other neck: Negative for soft tissue mass or adenopathy. Upper chest: Negative Review of the MIP images confirms the above findings CTA HEAD FINDINGS Anterior circulation: Mild narrowing of the right cavernous carotid compared to the right internal carotid artery below the skull base. Right anterior and middle cerebral arteries widely patent Diffuse narrowing left cavernous carotid with mild-to-moderate focal stenosis proximal cavernous carotid. Left anterior and middle cerebral arteries widely patent. Posterior circulation: Both vertebral arteries patent to the basilar. PICA patent bilaterally. Basilar widely patent. Superior cerebellar and posterior cerebral arteries widely patent. Venous sinuses: Patent Anatomic variants: None Delayed phase: Normal enhancement postcontrast administration. Review of the MIP images confirms the above findings IMPRESSION: 1. Negative CT head 2. Negative for emergent large vessel occlusion 3. Mild fusiform dilatation of the internal carotid artery below the skull base with subsequent narrowing to the cavernous carotid bilaterally. Question FMD. No intracranial aneurysm. 4. These results were called by telephone at the time of interpretation on 01/22/2019 at 9:36 pm to Dr. Caryl PinaERIC LINDZEN , who verbally acknowledged these results. Electronically Signed   By: Marlan Palauharles  Clark M.D.   On: 01/22/2019 21:36   Ct Angio Neck W Or Wo Contrast  Result Date: 01/22/2019 CLINICAL DATA:  Right facial droop, slurred speech, right arm numbness. Diabetes and hypertension EXAM: CT ANGIOGRAPHY HEAD AND NECK TECHNIQUE: Multidetector CT imaging of the head and neck was performed using the standard protocol during bolus administration of intravenous contrast. Multiplanar CT image reconstructions and MIPs were obtained to evaluate the vascular anatomy. Carotid stenosis measurements (when applicable) are obtained utilizing NASCET criteria, using the distal internal carotid diameter as the  denominator. CONTRAST:  75mL OMNIPAQUE IOHEXOL 350 MG/ML SOLN COMPARISON:  None. FINDINGS: CT HEAD FINDINGS Brain: No evidence of acute infarction, hemorrhage, hydrocephalus, extra-axial collection or mass lesion/mass effect. Vascular: Negative for hyperdense vessel Skull: Negative Sinuses: Negative Orbits: Negative Review of the MIP images confirms the above findings CTA NECK FINDINGS Aortic arch: Standard branching. Imaged portion shows no evidence of aneurysm or dissection. No significant  stenosis of the major arch vessel origins. Right carotid system: Right carotid bifurcation widely patent. Mild fusiform dilatation of the distal right internal carotid artery below the skull base with mild irregularity, question mild FMD. No significant stenosis Left carotid system: Similar findings to the right carotid with mild fusiform dilatation of the internal carotid artery below the skull base. Mild narrowing at the origin of the left internal carotid artery. No calcification or aneurysm. No significant stenosis. Vertebral arteries: Both vertebral arteries widely patent to the basilar without stenosis. Skeleton: No acute skeletal abnormality. Poor dentition with numerous caries. Other neck: Negative for soft tissue mass or adenopathy. Upper chest: Negative Review of the MIP images confirms the above findings CTA HEAD FINDINGS Anterior circulation: Mild narrowing of the right cavernous carotid compared to the right internal carotid artery below the skull base. Right anterior and middle cerebral arteries widely patent Diffuse narrowing left cavernous carotid with mild-to-moderate focal stenosis proximal cavernous carotid. Left anterior and middle cerebral arteries widely patent. Posterior circulation: Both vertebral arteries patent to the basilar. PICA patent bilaterally. Basilar widely patent. Superior cerebellar and posterior cerebral arteries widely patent. Venous sinuses: Patent Anatomic variants: None Delayed phase:  Normal enhancement postcontrast administration. Review of the MIP images confirms the above findings IMPRESSION: 1. Negative CT head 2. Negative for emergent large vessel occlusion 3. Mild fusiform dilatation of the internal carotid artery below the skull base with subsequent narrowing to the cavernous carotid bilaterally. Question FMD. No intracranial aneurysm. 4. These results were called by telephone at the time of interpretation on 01/22/2019 at 9:36 pm to Dr. Caryl Pina , who verbally acknowledged these results. Electronically Signed   By: Marlan Palau M.D.   On: 01/22/2019 21:36   ekg :  nsr at 90, nl axis, q in v1,2, st elevation in v1,2   Assessment & Plan:    Principal Problem:   TIA (transient ischemic attack) Active Problems:   Diabetes mellitus due to underlying condition without complications (HCC)   Essential hypertension, benign   Smoking   TIA Check hga1c, lipid, tsh Check cardiac echo PT/OT, speech therapy consult Start aspirin  po qday Cont Lipitor  po qhs Neurology consulted by ED , input appreciated  ? Fibromuscular dysplasia on CTA Defer to neurology on whether to pursue further w/up  Dm2 STOP Metformin, glucotrol for now fsbs ac and qhs, ISS  Hypertension Permissive hypertension for now  Tobacco use Pt counseled on smoking cessation for 3 minutes Nicotine patch prn     DVT Prophylaxis-   SCDs   AM Labs Ordered, also please review Full Orders  Family Communication: Admission, patients condition and plan of care including tests being ordered have been discussed with the patient who indicate understanding and agree with the plan and Code Status.  Code Status:   FULL CODE  Admission status: Observation : Based on patients clinical presentation and evaluation of above clinical data, I have made determination that patient meets observation criteria at this time.  Time spent in minutes : 70   Pearson Grippe M.D on 01/22/2019 at 11:15  PM

## 2019-01-22 NOTE — ED Notes (Signed)
ED TO INPATIENT HANDOFF REPORT  ED Nurse Name and Phone #:  [phone] Patty  S Name/Age/Gender Ebony Matthews 38 y.o. female Room/Bed: 027C/027C  Code Status   Code Status: Not on file  Home/SNF/Other Home Patient oriented to: self, place, time and situation Is this baseline? Yes   Triage Complete: Triage complete  Chief Complaint sharp pain, diabetic, rt numbess  Triage Note Pt from work w/ a c/o near syncope, right sided facial "drop," difficulty speaking/slurred speech, and right arm numbness. No LOC. No headache. No CP. No SOB. This happened ~ 1 hr ago. The near syncope feeling and right sided facial "drop" subsided after 5 mins. She still has right arm numbness.    Allergies No Known Allergies  Level of Care/Admitting Diagnosis ED Disposition    ED Disposition Condition Comment   Admit  Hospital Area: [facility] [[id number]]  Level of Care: Telemetry Medical [104]  I expect the patient will be discharged within 24 hours: No (not a candidate for 5C-Observation unit)  Covid Evaluation: N/A  Diagnosis: TIA (transient ischemic attack) [184037]  Admitting Physician: Pearson Grippe [[id number]]  Attending Physician: Pearson Grippe [[id number]]  PT Class (Do Not Modify): Observation [104]  PT Acc Code (Do Not Modify): Observation [10022]       B Medical/Surgery History Past Medical History:  Diagnosis Date  . Diabetes mellitus without complication (HCC)   . Hypertension    Past Surgical History:  Procedure Laterality Date  . NO PAST SURGERIES       A IV Location/Drains/Wounds Patient Lines/Drains/Airways Status   Active Line/Drains/Airways    Name:   Placement date:   Placement time:   Site:   Days:   Peripheral IV 01/22/19 Left Antecubital   01/22/19    1958    Antecubital   less than 1          Intake/Output Last 24 hours No intake or output data in the 24 hours ending 01/22/19 2254  Labs/Imaging Results for orders placed or performed during the  hospital encounter of 01/22/19 (from the past 48 hour(s))  CBG monitoring, ED     Status: Abnormal   Collection Time: 01/22/19  7:56 PM  Result Value Ref Range   Glucose-Capillary 242 (H) 70 - 99 mg/dL  Ethanol     Status: None   Collection Time: 01/22/19  8:11 PM  Result Value Ref Range   Alcohol, Ethyl (B) <10 <10 mg/dL    Comment: (NOTE) Lowest detectable limit for serum alcohol is 10 mg/dL. For medical purposes only. Performed at [facility], [address], [place], [place] [address]   Protime-INR     Status: None   Collection Time: 01/22/19  8:11 PM  Result Value Ref Range   Prothrombin Time 13.4 11.4 - 15.2 seconds   INR 1.0 0.8 - 1.2    Comment: (NOTE) INR goal varies based on device and disease states. Performed at [facility], [address], [place], [place] [address]   APTT     Status: Abnormal   Collection Time: 01/22/19  8:11 PM  Result Value Ref Range   aPTT 23 (L) 24 - 36 seconds    Comment: Performed at [facility], [address], [place], [place] [address]  CBC     Status: Abnormal   Collection Time: 01/22/19  8:11 PM  Result Value Ref Range   WBC 11.4 (H) 4.0 - 10.5 K/uL   RBC 4.71 3.87 -  5.11 MIL/uL   Hemoglobin 12.9 12.0 - 15.0 g/dL   HCT 16.1 09.6 - 04.5 %   MCV 85.4 80.0 - 100.0 fL   MCH 27.4 26.0 - 34.0 pg   MCHC 32.1 30.0 - 36.0 g/dL   RDW 40.9 81.1 - 91.4 %   Platelets 193 150 - 400 K/uL   nRBC 0.0 0.0 - 0.2 %    Comment: Performed at [facility], [address], [place], [place] [address]  Differential     Status: Abnormal   Collection Time: 01/22/19  8:11 PM  Result Value Ref Range   Neutrophils Relative % 59 %   Neutro Abs 6.7 1.7 - 7.7 K/uL   Lymphocytes Relative 35 %   Lymphs Abs 4.0 0.7 - 4.0 K/uL   Monocytes Relative 4 %   Monocytes Absolute 0.5 0.1 - 1.0 K/uL   Eosinophils Relative 0 %   Eosinophils Absolute 0.0 0.0 - 0.5 K/uL   Basophils Relative 2 %   Basophils Absolute 0.2 (H) 0.0 - 0.1 K/uL    nRBC 0 0 /100 WBC   Abs Immature Granulocytes 0.00 0.00 - 0.07 K/uL    Comment: Performed at [facility], [address], [place], [place] [address]  Comprehensive metabolic panel     Status: Abnormal   Collection Time: 01/22/19  8:11 PM  Result Value Ref Range   Sodium 134 (L) 135 - 145 mmol/L   Potassium 3.5 3.5 - 5.1 mmol/L   Chloride 101 98 - 111 mmol/L   CO2 20 (L) 22 - 32 mmol/L   Glucose, Bld 270 (H) 70 - 99 mg/dL   BUN 10 6 - 20 mg/dL   Creatinine, Ser 8.65 (H) 0.44 - 1.00 mg/dL   Calcium 9.4 8.9 - 78.4 mg/dL   Total Protein 8.0 6.5 - 8.1 g/dL   Albumin 4.2 3.5 - 5.0 g/dL   AST 18 15 - 41 U/L   ALT 14 0 - 44 U/L   Alkaline Phosphatase 97 38 - 126 U/L   Total Bilirubin 0.5 0.3 - 1.2 mg/dL   GFR calc non Af Amer >60 >60 mL/min   GFR calc Af Amer >60 >60 mL/min   Anion gap 13 5 - 15    Comment: Performed at [facility], [address], [place], [place] [address]  I-Stat beta hCG blood, ED     Status: None   Collection Time: 01/22/19  8:14 PM  Result Value Ref Range   I-stat hCG, quantitative <5.0 <5 mIU/mL   Comment 3            Comment:   GEST. AGE      CONC.  (mIU/mL)   <=1 WEEK        5 - 50     2 WEEKS       50 - 500     3 WEEKS       100 - 10,000     4 WEEKS     1,000 - 30,000        FEMALE AND NON-PREGNANT FEMALE:     LESS THAN 5 mIU/mL   I-stat Creatinine, ED     Status: None   Collection Time: 01/22/19  8:15 PM  Result Value Ref Range   Creatinine, Ser 0.90 0.44 - 1.00 mg/dL   Ct Angio Head W Or Wo Contrast  Result Date: 01/22/2019 CLINICAL DATA:  Right facial droop, slurred speech, right arm numbness. Diabetes and  hypertension EXAM: CT ANGIOGRAPHY HEAD AND NECK TECHNIQUE: Multidetector CT imaging of the head and neck was performed using the standard protocol during bolus administration of intravenous contrast. Multiplanar CT image reconstructions and MIPs were obtained to evaluate the vascular anatomy. Carotid stenosis measurements (when  applicable) are obtained utilizing NASCET criteria, using the distal internal carotid diameter as the denominator. CONTRAST:  75mL OMNIPAQUE IOHEXOL 350 MG/ML SOLN COMPARISON:  None. FINDINGS: CT HEAD FINDINGS Brain: No evidence of acute infarction, hemorrhage, hydrocephalus, extra-axial collection or mass lesion/mass effect. Vascular: Negative for hyperdense vessel Skull: Negative Sinuses: Negative Orbits: Negative Review of the MIP images confirms the above findings CTA NECK FINDINGS Aortic arch: Standard branching. Imaged portion shows no evidence of aneurysm or dissection. No significant stenosis of the major arch vessel origins. Right carotid system: Right carotid bifurcation widely patent. Mild fusiform dilatation of the distal right internal carotid artery below the skull base with mild irregularity, question mild FMD. No significant stenosis Left carotid system: Similar findings to the right carotid with mild fusiform dilatation of the internal carotid artery below the skull base. Mild narrowing at the origin of the left internal carotid artery. No calcification or aneurysm. No significant stenosis. Vertebral arteries: Both vertebral arteries widely patent to the basilar without stenosis. Skeleton: No acute skeletal abnormality. Poor dentition with numerous caries. Other neck: Negative for soft tissue mass or adenopathy. Upper chest: Negative Review of the MIP images confirms the above findings CTA HEAD FINDINGS Anterior circulation: Mild narrowing of the right cavernous carotid compared to the right internal carotid artery below the skull base. Right anterior and middle cerebral arteries widely patent Diffuse narrowing left cavernous carotid with mild-to-moderate focal stenosis proximal cavernous carotid. Left anterior and middle cerebral arteries widely patent. Posterior circulation: Both vertebral arteries patent to the basilar. PICA patent bilaterally. Basilar widely patent. Superior cerebellar and  posterior cerebral arteries widely patent. Venous sinuses: Patent Anatomic variants: None Delayed phase: Normal enhancement postcontrast administration. Review of the MIP images confirms the above findings IMPRESSION: 1. Negative CT head 2. Negative for emergent large vessel occlusion 3. Mild fusiform dilatation of the internal carotid artery below the skull base with subsequent narrowing to the cavernous carotid bilaterally. Question FMD. No intracranial aneurysm. 4. These results were called by telephone at the time of interpretation on 01/22/2019 at 9:36 pm to Dr. Caryl Pina , who verbally acknowledged these results. Electronically Signed   By: Marlan Palau M.D.   On: 01/22/2019 21:36   Ct Angio Neck W Or Wo Contrast  Result Date: 01/22/2019 CLINICAL DATA:  Right facial droop, slurred speech, right arm numbness. Diabetes and hypertension EXAM: CT ANGIOGRAPHY HEAD AND NECK TECHNIQUE: Multidetector CT imaging of the head and neck was performed using the standard protocol during bolus administration of intravenous contrast. Multiplanar CT image reconstructions and MIPs were obtained to evaluate the vascular anatomy. Carotid stenosis measurements (when applicable) are obtained utilizing NASCET criteria, using the distal internal carotid diameter as the denominator. CONTRAST:  75mL OMNIPAQUE IOHEXOL 350 MG/ML SOLN COMPARISON:  None. FINDINGS: CT HEAD FINDINGS Brain: No evidence of acute infarction, hemorrhage, hydrocephalus, extra-axial collection or mass lesion/mass effect. Vascular: Negative for hyperdense vessel Skull: Negative Sinuses: Negative Orbits: Negative Review of the MIP images confirms the above findings CTA NECK FINDINGS Aortic arch: Standard branching. Imaged portion shows no evidence of aneurysm or dissection. No significant stenosis of the major arch vessel origins. Right carotid system: Right carotid bifurcation widely patent. Mild fusiform dilatation of the distal right internal  carotid  artery below the skull base with mild irregularity, question mild FMD. No significant stenosis Left carotid system: Similar findings to the right carotid with mild fusiform dilatation of the internal carotid artery below the skull base. Mild narrowing at the origin of the left internal carotid artery. No calcification or aneurysm. No significant stenosis. Vertebral arteries: Both vertebral arteries widely patent to the basilar without stenosis. Skeleton: No acute skeletal abnormality. Poor dentition with numerous caries. Other neck: Negative for soft tissue mass or adenopathy. Upper chest: Negative Review of the MIP images confirms the above findings CTA HEAD FINDINGS Anterior circulation: Mild narrowing of the right cavernous carotid compared to the right internal carotid artery below the skull base. Right anterior and middle cerebral arteries widely patent Diffuse narrowing left cavernous carotid with mild-to-moderate focal stenosis proximal cavernous carotid. Left anterior and middle cerebral arteries widely patent. Posterior circulation: Both vertebral arteries patent to the basilar. PICA patent bilaterally. Basilar widely patent. Superior cerebellar and posterior cerebral arteries widely patent. Venous sinuses: Patent Anatomic variants: None Delayed phase: Normal enhancement postcontrast administration. Review of the MIP images confirms the above findings IMPRESSION: 1. Negative CT head 2. Negative for emergent large vessel occlusion 3. Mild fusiform dilatation of the internal carotid artery below the skull base with subsequent narrowing to the cavernous carotid bilaterally. Question FMD. No intracranial aneurysm. 4. These results were called by telephone at the time of interpretation on 01/22/2019 at 9:36 pm to Dr. Caryl PinaERIC LINDZEN , who verbally acknowledged these results. Electronically Signed   By: Marlan Palauharles  Clark M.D.   On: 01/22/2019 21:36    Pending Labs Unresulted Labs (From admission, onward)    Start      Ordered   01/23/19 0500  Hemoglobin A1c  Tomorrow morning,   R     01/22/19 2227   01/23/19 0500  Lipid panel  Tomorrow morning,   R     01/22/19 2227   01/22/19 1940  Urine rapid drug screen (hosp performed)  ONCE - STAT,   STAT     01/22/19 1939   01/22/19 1940  Urinalysis, Routine w reflex microscopic  ONCE - STAT,   STAT     01/22/19 1939          Vitals/Pain Today's Vitals   01/22/19 1935 01/22/19 1936 01/22/19 1945  BP: (!) 174/84  (!) 162/90  Pulse: 99 100 91  Resp:   10  SpO2: 100% 100% 100%  PainSc: 0-No pain      Isolation Precautions No active isolations  Medications Medications  iohexol (OMNIPAQUE) 350 MG/ML injection 75 mL (75 mLs Intravenous Contrast Given 01/22/19 2055)    Mobility walks Low fall risk   Focused Assessments Neuro Assessment Handoff:  Swallow screen pass? Yes    NIH Stroke Scale ( + Modified Stroke Scale Criteria)  Interval: Initial Level of Consciousness (1a.)   : Alert, keenly responsive LOC Questions (1b. )   +: Answers both questions correctly LOC Commands (1c. )   + : Performs both tasks correctly Best Gaze (2. )  +: Normal Visual (3. )  +: No visual loss Facial Palsy (4. )    : Normal symmetrical movements Motor Arm, Left (5a. )   +: No drift Motor Arm, Right (5b. )   +: No drift Motor Leg, Left (6a. )   +: No drift Motor Leg, Right (6b. )   +: No drift Limb Ataxia (7. ): Absent Sensory (8. )   +: Normal, no sensory  loss Best Language (9. )   +: No aphasia Dysarthria (10. ): Normal Extinction/Inattention (11.)   +: No Abnormality Modified SS Total  +: 0 Complete NIHSS TOTAL: 0     Neuro Assessment: Exceptions to WDL Neuro Checks:   Initial (01/22/19 1937)  Last Documented NIHSS Modified Score: 0 (01/22/19 1937) Has TPA been given? No If patient is a Neuro Trauma and patient is going to OR before floor call report to 4N Charge nurse: [phone] or [phone]     R Recommendations: See Admitting Provider  Note  Report given to:   Additional Notes:

## 2019-01-22 NOTE — ED Triage Notes (Signed)
Pt from work w/ a c/o near syncope, right sided facial "drop," difficulty speaking/slurred speech, and right arm numbness. No LOC. No headache. No CP. No SOB. This happened ~ 1 hr ago. The near syncope feeling and right sided facial "drop" subsided after 5 mins. She still has right arm numbness.

## 2019-01-22 NOTE — Consult Note (Addendum)
Referring Physician: Dr. Rush Landmark    Chief Complaint: Right sided weakness  HPI: Ebony Matthews is an 38 y.o. female with untreated DM and HTN who presents to the ED after sudden onset at work of near-syncope, slurred speech, RUE sensory numbness ("like it fell asleep"), RUE weakness/incoordination and right sided facial droop. The symptoms almost completely resolved after 5 minutes, with residual mild RUE paresthesia persisting. 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   Denies current headache or neck pain, but has chronic LBP. Denies vision loss, trouble finding words, trouble understanding speech, dysphagia, or left sided neurological symptoms. No CP, cough, sneezing, SOB or fever. No abdominal pain or urinary symptoms.   Of note, she does not have a PCP.   CBG in the ED was 270. She does not attempt to control her diabetes with diet. Although metformin is on her list of home medications, she denies taking any medication for her diabetes.   EKG: Sinus rhythm Probable anteroseptal infarct, recent When compared to prior, slower rate. Similar ST abnormalities. No STEMI   Past Medical History:  Diagnosis Date  . Diabetes mellitus without complication (HCC)   . Hypertension     Past Surgical History:  Procedure Laterality Date  . NO PAST SURGERIES      Family History  Problem Relation Age of Onset  . Diabetes Other   . Hypertension Maternal Grandmother    Social History:  reports that she has been smoking. She has a 15.00 pack-year smoking history. She has never used smokeless tobacco. She reports current drug use. Drug: Marijuana. She reports that she does not drink alcohol.  Allergies: No Known Allergies  Medications:    ROS: As per HPI. All other systems negative.   Physical Examination: Blood pressure (!) 162/90, pulse 91, resp. rate 10, SpO2 100 %.  HEENT: [place]/AT Lungs: Respirations unlabored Ext: No edema  Neurologic  Examination: Mental Status: Alert, fully oriented, thought content appropriate.  Speech fluent with intact naming and repetition. Decreased attention with recurrent apparent inability to register the steps of a 3 step command.  Cranial Nerves: II:  Visual fields intact with no extinction to DSS. PERRL.  III,IV, VI: No ptosis. EOMI without nystagmus.  V,VII: Smile symmetric, facial temp sensation equal bilaterally VIII: hearing intact to voice IX,X: Palate rises symmetrically XI: Symmetric XII: midline tongue extension  Motor: Right : Upper extremity   5/5    Left:     Upper extremity   5/5  Lower extremity   5/5     Lower extremity   5/5 No pronator drift.  Sensory: Temp and light touch intact x 4 without extinction.  Deep Tendon Reflexes:  2+ bilateral upper and lower extremities without asymmetry. Plantars: Right: downgoing   Left: downgoing Cerebellar: No ataxia with FNF bilaterally.  Gait: Deferred  Results for orders placed or performed during the hospital encounter of 01/22/19 (from the past 48 hour(s))  CBG monitoring, ED     Status: Abnormal   Collection Time: 01/22/19  7:56 PM  Result Value Ref Range   Glucose-Capillary 242 (H) 70 - 99 mg/dL   No results found.  Assessment: 38 y.o. female presenting after a 5 minute episode of TIA symptoms referable to her left MCA territory.  1. Exam is nonfocal despite residual paresthesias to distal RUE.  2. Hyperalgesia to feet during testing of Babinski reflexes suggests a diabetic peripheral neuropathy.  3. Stroke Risk Factors - DM and HTN 4.  Presyncope.  5. DM2, not managed with medication or diet.   Recommendations: 1. CT head with CTA of head and neck 2. Pending CT results, may need to obtain an MRI brain study 3. Start ASA 81 mg po qd if CT head shows no hemorrhage.  4. Telemetry monitoring 5. Frequent neuro checks 6. Permissive HTN.  7. Orthostatics. Pending results, may need a fluid bolus.  8. Diabetes education.  9.  Case management consultation regarding outpatient health care system access issues.  10. HgbA1c, fasting lipid panel  @Electronically  signed: Dr. Caryl PinaEric Bethanee Redondo@  01/22/2019, 8:01 PM

## 2019-01-23 ENCOUNTER — Observation Stay (HOSPITAL_COMMUNITY): Payer: Self-pay

## 2019-01-23 ENCOUNTER — Observation Stay (HOSPITAL_BASED_OUTPATIENT_CLINIC_OR_DEPARTMENT_OTHER): Payer: Self-pay

## 2019-01-23 DIAGNOSIS — G459 Transient cerebral ischemic attack, unspecified: Secondary | ICD-10-CM

## 2019-01-23 LAB — GLUCOSE, CAPILLARY
Glucose-Capillary: 154 mg/dL — ABNORMAL HIGH (ref 70–99)
Glucose-Capillary: 155 mg/dL — ABNORMAL HIGH (ref 70–99)
Glucose-Capillary: 156 mg/dL — ABNORMAL HIGH (ref 70–99)
Glucose-Capillary: 183 mg/dL — ABNORMAL HIGH (ref 70–99)

## 2019-01-23 LAB — CBC
HCT: 35.7 % — ABNORMAL LOW (ref 36.0–46.0)
Hemoglobin: 11.7 g/dL — ABNORMAL LOW (ref 12.0–15.0)
MCH: 27.6 pg (ref 26.0–34.0)
MCHC: 32.8 g/dL (ref 30.0–36.0)
MCV: 84.2 fL (ref 80.0–100.0)
Platelets: 187 10*3/uL (ref 150–400)
RBC: 4.24 MIL/uL (ref 3.87–5.11)
RDW: 14.9 % (ref 11.5–15.5)
WBC: 9.4 10*3/uL (ref 4.0–10.5)
nRBC: 0 % (ref 0.0–0.2)

## 2019-01-23 LAB — COMPREHENSIVE METABOLIC PANEL
ALT: 13 U/L (ref 0–44)
AST: 13 U/L — ABNORMAL LOW (ref 15–41)
Albumin: 3.4 g/dL — ABNORMAL LOW (ref 3.5–5.0)
Alkaline Phosphatase: 80 U/L (ref 38–126)
Anion gap: 11 (ref 5–15)
BUN: 8 mg/dL (ref 6–20)
CO2: 21 mmol/L — ABNORMAL LOW (ref 22–32)
Calcium: 9 mg/dL (ref 8.9–10.3)
Chloride: 106 mmol/L (ref 98–111)
Creatinine, Ser: 0.87 mg/dL (ref 0.44–1.00)
GFR calc Af Amer: >60 mL/min (ref >60)
GFR calc non Af Amer: >60 mL/min (ref >60)
Glucose, Bld: 171 mg/dL — ABNORMAL HIGH (ref 70–99)
Potassium: 3.4 mmol/L — ABNORMAL LOW (ref 3.5–5.1)
Sodium: 138 mmol/L (ref 135–145)
Total Bilirubin: 0.4 mg/dL (ref 0.3–1.2)
Total Protein: 6.7 g/dL (ref 6.5–8.1)

## 2019-01-23 LAB — TSH: TSH: 1.522 u[IU]/mL (ref 0.350–4.500)

## 2019-01-23 LAB — ANTITHROMBIN III: AntiThromb III Func: 119 % (ref 75–120)

## 2019-01-23 LAB — ECHOCARDIOGRAM LIMITED BUBBLE STUDY
Height: 67 in
Weight: 3044.11 oz

## 2019-01-23 LAB — HEMOGLOBIN A1C
Hgb A1c MFr Bld: 9.9 % — ABNORMAL HIGH (ref 4.8–5.6)
Mean Plasma Glucose: 237.43 mg/dL

## 2019-01-23 LAB — LIPID PANEL
Cholesterol: 234 mg/dL — ABNORMAL HIGH (ref 0–200)
HDL: 35 mg/dL — ABNORMAL LOW (ref >40)
LDL Cholesterol: 174 mg/dL — ABNORMAL HIGH (ref 0–99)
Total CHOL/HDL Ratio: 6.7 RATIO
Triglycerides: 125 mg/dL (ref <150)
VLDL: 25 mg/dL (ref 0–40)

## 2019-01-23 LAB — C-REACTIVE PROTEIN: CRP: <0.8 mg/dL (ref <1.0)

## 2019-01-23 LAB — SEDIMENTATION RATE: Sed Rate: 17 mm/hr (ref 0–22)

## 2019-01-23 LAB — HIV ANTIBODY (ROUTINE TESTING W REFLEX): HIV Screen 4th Generation wRfx: NONREACTIVE

## 2019-01-23 MED ORDER — ASPIRIN 325 MG PO TABS
325.0000 mg | ORAL_TABLET | Freq: Every day | ORAL | Status: DC
  Administered 2019-01-24: 11:00:00 325 mg via ORAL
  Filled 2019-01-23: qty 1

## 2019-01-23 MED ORDER — ATORVASTATIN CALCIUM 40 MG PO TABS: 40.0000 mg | ORAL_TABLET | Freq: Every day | ORAL | Status: DC

## 2019-01-23 MED ORDER — INSULIN ASPART 100 UNIT/ML ~~LOC~~ SOLN
0.0000 [IU] | Freq: Three times a day (TID) | SUBCUTANEOUS | Status: DC
  Administered 2019-01-23: 2 [IU] via SUBCUTANEOUS

## 2019-01-23 MED ORDER — ACETAMINOPHEN 325 MG PO TABS: 650.0000 mg | ORAL_TABLET | ORAL | Status: DC | PRN

## 2019-01-23 MED ORDER — ATORVASTATIN CALCIUM 40 MG PO TABS
40.0000 mg | ORAL_TABLET | Freq: Every day | ORAL | Status: DC
  Administered 2019-01-24: 40 mg via ORAL
  Filled 2019-01-23: qty 1

## 2019-01-23 MED ORDER — INSULIN ASPART 100 UNIT/ML ~~LOC~~ SOLN
0.0000 [IU] | Freq: Three times a day (TID) | SUBCUTANEOUS | Status: DC
  Administered 2019-01-23 (×2): 3 [IU] via SUBCUTANEOUS
  Administered 2019-01-24: 2 [IU] via SUBCUTANEOUS
  Administered 2019-01-24: 14:00:00 3 [IU] via SUBCUTANEOUS
  Administered 2019-01-24: 08:00:00 2 [IU] via SUBCUTANEOUS

## 2019-01-23 MED ORDER — ATORVASTATIN CALCIUM 10 MG PO TABS
20.0000 mg | ORAL_TABLET | Freq: Every day | ORAL | Status: DC
  Administered 2019-01-23: 20 mg via ORAL
  Filled 2019-01-23: qty 2

## 2019-01-23 MED ORDER — ACETAMINOPHEN 650 MG RE SUPP: 650.0000 mg | RECTAL | Status: DC | PRN

## 2019-01-23 MED ORDER — ASPIRIN EC 81 MG PO TBEC
81.0000 mg | DELAYED_RELEASE_TABLET | Freq: Every day | ORAL | Status: DC
  Administered 2019-01-23: 08:00:00 81 mg via ORAL
  Filled 2019-01-23: qty 1

## 2019-01-23 MED ORDER — INSULIN ASPART 100 UNIT/ML ~~LOC~~ SOLN: 0.0000 [IU] | Freq: Every day | SUBCUTANEOUS | Status: DC

## 2019-01-23 MED ORDER — STROKE: EARLY STAGES OF RECOVERY BOOK
Freq: Once | Status: AC
  Administered 2019-01-23: 01:00:00

## 2019-01-23 MED ORDER — ACETAMINOPHEN 160 MG/5ML PO SOLN: 650.0000 mg | ORAL | Status: DC | PRN

## 2019-01-23 NOTE — Progress Notes (Signed)
Pt from Home to [facility] ed c/o near syncope ,facial droop slurred speech with rt hand numbness and tingling  Pt alert and oriented x 4  PMH of HTN and DM, but does not take BP nor DM medications more than 1 year. Stated has no primary MD  Oriented to room and unit  and stroke / smoking cessation  Education given. Call bell within reach Te # 14 in use NSR on the monitor.  Will continue to monitor.

## 2019-01-23 NOTE — Plan of Care (Signed)
  Problem: Education: Goal: Knowledge of secondary prevention will improve Outcome: Not Progressing   Problem: Education: Goal: Knowledge of secondary prevention will improve Outcome: Not Progressing   Problem: Education: Goal: Knowledge of disease or condition will improve Outcome: Not Progressing

## 2019-01-23 NOTE — Evaluation (Signed)
Speech Language Pathology Evaluation Patient Details Name: Ebony Matthews MRN: [id number] DOB: April 13, 1981 Today's Date: 01/23/2019 Time: 1000-1020 SLP Time Calculation (min) (ACUTE ONLY): 20 min  Problem List:  Patient Active Problem List   Diagnosis Date Noted  . TIA (transient ischemic attack) 01/22/2019  . Diabetes mellitus due to underlying condition without complications (HCC) 05/30/2014  . Essential hypertension, benign 05/30/2014  . Smoking 05/30/2014   Past Medical History:  Past Medical History:  Diagnosis Date  . Diabetes mellitus without complication (HCC)   . Hypertension    Past Surgical History:  Past Surgical History:  Procedure Laterality Date  . NO PAST SURGERIES     HPI:  38 year old female admitted 01/22/2019 with difficulty with speech, right facial droop, dizziness, RUE weakness and numbness resolved after 5-10 minutes.Marland Kitchen PMH: HTN, DM2. Noncompliance with metformin, BP, or cholesterol meds. Head CT negative. MRI = multifocal acuate ischemia, predominantly in the left hemisphere. Single focus within right frontal white matter, punctate ischemic foci along the left motor strop compatible with RUE symptoms. No hemorrhage or mass effect.    Assessment / Plan / Recommendation Clinical Impression  Pt presents at baseline cognitive linguistic function. She reports no continued deficits. Speech is fully intelligible, CN exam WFL. Pt was encouraged to notify RN/PCP if difficulties arise once she returns to normal routines. Pt was fully independent prior to admit. ST signing off.     SLP Assessment  SLP Recommendation/Assessment: Patient does not need any further Speech Language Pathology Services    Follow Up Recommendations  None       SLP Evaluation Cognition  Overall Cognitive Status: Within Functional Limits for tasks assessed Orientation Level: Oriented X4       Comprehension  Auditory Comprehension Overall Auditory Comprehension: Appears within  functional limits for tasks assessed    Expression Expression Primary Mode of Expression: Verbal Verbal Expression Overall Verbal Expression: Appears within functional limits for tasks assessed Written Expression Dominant Hand: Right   Oral / Motor  Oral Motor/Sensory Function Overall Oral Motor/Sensory Function: Within functional limits Motor Speech Overall Motor Speech: Appears within functional limits for tasks assessed   GO                   Kayron Kalmar B. Murvin [facility], CCC-SLP Speech Language Pathologist [phone]  [place] 01/23/2019, 10:23 AM

## 2019-01-23 NOTE — Evaluation (Signed)
Physical Therapy Evaluation Patient Details Name: Ebony Matthews MRN: [id number] DOB: Apr 25, 1981 Today's Date: 01/23/2019   History of Present Illness   Ebony Matthews  is a 38 y.o. female,w hypertension, dm2, who presents with c/o difficulty with speech, right facial droop, dizziness (no vertigo),  as well as right upper extremity weakness and numbness, starting about 6:30pm while at [employer].  Pt states that her symptoms lasted about 5-10 minutes and disappeared.  Pt denies fever, chills, cough, cp, palp, sob, n/v, abd pain, diarrhea, brbpr, dysuria.  Pt notes she has been noncompliant with her metformin as well as not taking her bp or cholesterol medication.  Clinical Impression  Pt admitted with above. Pts symptoms have resolved with exception of R palm numbness. Pt functioning near baseline. Pt initially guarded with ambulation but then progressed to safe mod I. 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    Follow Up Recommendations No PT follow up;Supervision - Intermittent    Equipment Recommendations  None recommended by PT    Recommendations for Other Services       Precautions / Restrictions Precautions Precautions: None Restrictions Weight Bearing Restrictions: No      Mobility  Bed Mobility Overal bed mobility: Modified Independent             General bed mobility comments: HOB elevated, no difficulty, didn't need help  Transfers Overall transfer level: Needs assistance Equipment used: None Transfers: Sit to/from Stand Sit to Stand: Supervision         General transfer comment: supervision due to first time up and h/o dizziness, pt denies dizziness at this time  Ambulation/Gait Ambulation/Gait assistance: Supervision Gait Distance (Feet): 250 Feet Assistive device: None Gait Pattern/deviations: Step-through pattern;Decreased stride length Gait velocity: decreased Gait velocity interpretation:  >2.62 ft/sec, indicative of community ambulatory General Gait Details: pt initially guarded, short step length, improved with time, no epsisodes of dizziness or instability  Stairs Stairs: Yes Stairs assistance: Min guard Stair Management: One rail Right Number of Stairs: 12 General stair comments: pt with smoother pattern ascending than descending  Wheelchair Mobility    Modified Rankin (Stroke Patients Only)       Balance Overall balance assessment: Modified Independent                               Standardized Balance Assessment Standardized Balance Assessment : Dynamic Gait Index   Dynamic Gait Index Level Surface: Normal Change in Gait Speed: Normal Gait with Horizontal Head Turns: Normal Gait with Vertical Head Turns: Normal Gait and Pivot Turn: Normal Step Over Obstacle: Normal Step Around Obstacles: Normal Steps: Mild Impairment Total Score: 23       Pertinent Vitals/Pain Pain Assessment: No/denies pain    Home Living Family/patient expects to be discharged to:: Private residence Living Arrangements: Other relatives(sister) Available Help at Discharge: Family;Available 24 hours/day(while COVID quarantine is going on) Type of Home: House Home Access: Stairs to enter Entrance Stairs-Rails: Right Entrance Stairs-Number of Steps: 2 Home Layout: One level Home Equipment: None      Prior Function Level of Independence: Independent         Comments: head cook at [employer]     Hand Dominance   Dominant Hand: Right    Extremity/Trunk Assessment   Upper Extremity Assessment Upper Extremity Assessment: RUE deficits/detail RUE Deficits / Details: numbness in palm only, not fingers or dorsal  ascept of hand, strength 5/5 RUE Coordination: WNL    Lower Extremity Assessment Lower Extremity Assessment: Overall WFL for tasks assessed    Cervical / Trunk Assessment Cervical / Trunk Assessment: Normal  Communication   Communication: No  difficulties  Cognition Arousal/Alertness: Awake/alert Behavior During Therapy: WFL for tasks assessed/performed Overall Cognitive Status: Within Functional Limits for tasks assessed                                        General Comments General comments (skin integrity, edema, etc.): VSS    Exercises     Assessment/Plan    PT Assessment Patient needs continued PT services  PT Problem List Decreased strength;Decreased activity tolerance;Decreased balance;Decreased mobility;Decreased coordination       PT Treatment Interventions Gait training;Stair training;Functional mobility training;Therapeutic activities;Therapeutic exercise;Balance training;Neuromuscular re-education    PT Goals (Current goals can be found in the Care Plan section)  Acute Rehab PT Goals Patient Stated Goal: home PT Goal Formulation: With patient Time For Goal Achievement: 02/06/19 Potential to Achieve Goals: Good Additional Goals Additional Goal #1: Pt to scare >42 on [place] to indicate minimal falls risk.    Frequency Min 3X/week   Barriers to discharge        Co-evaluation               AM-PAC PT "6 Clicks" Mobility  Outcome Measure Help needed turning from your back to your side while in a flat bed without using bedrails?: None Help needed moving from lying on your back to sitting on the side of a flat bed without using bedrails?: None Help needed moving to and from a bed to a chair (including a wheelchair)?: None Help needed standing up from a chair using your arms (e.g., wheelchair or bedside chair)?: A Little Help needed to walk in hospital room?: A Little Help needed climbing 3-5 steps with a railing? : A Little 6 Click Score: 21    End of Session Equipment Utilized During Treatment: Gait belt Activity Tolerance: Patient tolerated treatment well Patient left: in chair;with call bell/phone within reach;with nursing/sitter in room Nurse Communication: Mobility  status PT Visit Diagnosis: Difficulty in walking, not elsewhere classified (R26.2)    Time: [id number] PT Time Calculation (min) (ACUTE ONLY): 18 min   Charges:   PT Evaluation $PT Eval Low Complexity: 1 Low          Lewis ShockAshly Caydin Yeatts, PT, DPT Acute Rehabilitation Services Pager #: [phone] Office #: [phone]   Iona Hansenshly M Genette Huertas 01/23/2019, 9:41 AM

## 2019-01-23 NOTE — Progress Notes (Signed)
STROKE TEAM PROGRESS NOTE   HISTORY OF PRESENT ILLNESS (per record) Ebony Matthews is an 38 y.o. female with untreated DM and HTN who presents to the ED after sudden onset at work of near-syncope, slurred speech, RUE sensory numbness ("like it fell asleep"), RUE weakness/incoordination and right sided facial droop. The symptoms almost completely resolved after 5 minutes, with residual mild RUE paresthesia persisting. 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   Denies current headache or neck pain, but has chronic LBP. Denies vision loss, trouble finding words, trouble understanding speech, dysphagia, or left sided neurological symptoms. No CP, cough, sneezing, SOB or fever. No abdominal pain or urinary symptoms.   Of note, she does not have a PCP.   CBG in the ED was 270. She does not attempt to control her diabetes with diet. Although metformin is on her list of home medications, she denies taking any medication for her diabetes.   EKG: Sinus rhythm Probable anteroseptal infarct, recent When compared to prior, slower rate. Similar ST abnormalities. No STEMI   SUBJECTIVE (INTERVAL HISTORY) Showed patient MR images this morning. Embolic strokes with RUE and face weakness. Not taking medications at home. Uncontrolled diabetes, uncontrolled HLD, current smoker. Loop planned for Monday. Trish notified. Hypercoag panel ordered. Patient upset and crying. Spoke to attending about plan.    OBJECTIVE Vitals:   01/23/19 0600 01/23/19 0800 01/23/19 1203 01/23/19 1547  BP: (!) 174/97 (!) 154/96 (!) 146/86 (!) 166/86  Pulse:  86 91 79  Resp:   16 14  Temp: 98.6 F (37 C) 98.3 F (36.8 C) 97.8 F (36.6 C) 98.3 F (36.8 C)  TempSrc: Oral Oral Oral Oral  SpO2: 100% 100% 100% 99%  Weight:      Height:        CBC:  Recent Labs  Lab 01/22/19 2011 01/23/19 0407  WBC 11.4* 9.4  NEUTROABS 6.7  --   HGB 12.9 11.7*  HCT 40.2 35.7*  MCV 85.4 84.2  PLT  193 187    Basic Metabolic Panel:  Recent Labs  Lab 01/22/19 2011 01/22/19 2015 01/23/19 0407  NA 134*  --  138  K 3.5  --  3.4*  CL 101  --  106  CO2 20*  --  21*  GLUCOSE 270*  --  171*  BUN 10  --  8  CREATININE 1.03* 0.90 0.87  CALCIUM 9.4  --  9.0    Lipid Panel:     Component Value Date/Time   CHOL 234 (H) 01/23/2019 0407   TRIG 125 01/23/2019 0407   HDL 35 (L) 01/23/2019 0407   CHOLHDL 6.7 01/23/2019 0407   VLDL 25 01/23/2019 0407   LDLCALC 174 (H) 01/23/2019 0407   HgbA1c:  Lab Results  Component Value Date   HGBA1C 9.9 (H) 01/23/2019   Urine Drug Screen:     Component Value Date/Time   LABOPIA NONE DETECTED 01/22/2019 2300   COCAINSCRNUR POSITIVE (A) 01/22/2019 2300   LABBENZ NONE DETECTED 01/22/2019 2300   AMPHETMU NONE DETECTED 01/22/2019 2300   THCU POSITIVE (A) 01/22/2019 2300   LABBARB NONE DETECTED 01/22/2019 2300    Alcohol Level     Component Value Date/Time   ETH <10 01/22/2019 2011    IMAGING  Ct Angio Head W Or Wo Contrast Ct Angio Neck W Or Wo Contrast 01/22/2019 IMPRESSION:  1. Negative CT head  2. Negative for emergent large vessel occlusion  3. Mild fusiform dilatation  of the internal carotid artery below the skull base with subsequent narrowing to the cavernous carotid bilaterally. Question FMD. No intracranial aneurysm.   Dg Chest 2 View 01/23/2019 IMPRESSION:  No active cardiopulmonary disease.   Mr Brain Wo Contrast 01/23/2019 IMPRESSION:  1. Multifocal acute ischemia, predominantly in the left hemisphere, although there is a single focus within the right frontal white matter. Punctate ischemic foci along the left motor strip are compatible with reported right upper extremity symptoms.  2. No hemorrhage or mass effect.  3. Mild findings of chronic ischemic microangiopathy.     Transthoracic Echocardiogram with bubble study 4/26//2020 IMPRESSIONS  1. The left ventricle has normal systolic function, with an ejection  fraction of 55-60%. The cavity size was normal. Left ventricular diastolic Doppler parameters are consistent with impaired relaxation.  2. The right ventricle has normal systolc function. The cavity was normal. There is no increase in right ventricular wall thickness.  3. No atrial level shunt detected by color flow Doppler. Agitated saline contrast was given intravenously to evaluate for intracardiac shunting. Saline contrast bubble study was negative, with no evidence of any interatrial shunt.  4. No intracardiac thrombi or masses were visualized.    EKG - SR rate 90 BPM. (See cardiology reading for complete details)    PHYSICAL EXAM Blood pressure (!) 166/86, pulse 79, temperature 98.3 F (36.8 C), temperature source Oral, resp. rate 14, height  (1.702 m), weight 86.3 kg, SpO2 99 %.  Physical exam: Exam: Gen: NAD, conversant, well nourised, obese, well groomed                     CV: RRR, no MRG. No Carotid Bruits. No peripheral edema, warm, nontender Eyes: Conjunctivae clear without exudates or hemorrhage  Neuro: Detailed Neurologic Exam  Speech:    Speech is normal; fluent and spontaneous with normal comprehension.  Cognition:    The patient is oriented to person, place, and time;     recent and remote memory intact;     language fluent;     normal attention, concentration,     fund of knowledge Cranial Nerves:    The pupils are equal, round, and reactive to light.Visual fields are full to finger confrontation. Extraocular movements are intact. Trigeminal sensation is intact and the muscles of mastication are normal. The face is symmetric. The palate elevates in the midline. Hearing intact. Voice is normal. Shoulder shrug is normal. The tongue has normal motion without fasciculations.   Coordination:    Normal finger to nose and heel to shin. Normal rapid alternating movements.   Gait:    Heel-toe and tandem gait are normal.   Motor Observation:    No asymmetry, no  atrophy, and no involuntary movements noted. Tone:    Normal muscle tone.    Posture:    Posture is normal. normal erect    Strength:    Strength is V/V in the upper and lower limbs.      Sensation: intact to LT     Reflex Exam:  DTR's:    Deep tendon reflexes in the upper and lower extremities are normal bilaterally.   Toes:    The toes are downgoing bilaterally.   Clonus:    Clonus is absent.  ASSESSMENT/PLAN Ms. Alaila Pillard is a 38 y.o. female with history of DM, HTN, tobacco use, polysubstance abuse and chronic low back pain presenting with sudden onset at work of near-syncope, slurred speech, RUE sensory numbness ("like it fell  asleep"), RUE weakness/incoordination and right sided facial droop.  Not taking medications at home. Uncontrolled diabetes, uncontrolled HLD, current smoker.   Stroke:  Multiple bilateral infarcts - embolic - unknown source, UDS +Cocaine may consider this as etiology however needs thorough eval due to young age  Resultant  resolved  CT head -  Negative CT head   MRI head -  Multifocal acute ischemia, predominantly in the left hemisphere, although there is a single focus within the right frontal white matter. Punctate ischemic foci along the left motor strip are compatible with reported right upper extremity symptoms.   MRA head - not performed  CTA H&N - Mild fusiform dilatation of the internal carotid artery below the skull base with subsequent narrowing to the cavernous carotid bilaterally. Question FMD  Carotid Doppler - CTA neck performed - carotid dopplers not indicated.  2D Echo  - EF 60 - 65%. No cardiac source of emboli identified.   LDL - 174  HgbA1c - 9.9  UDS - Cocaine and THCU  VTE prophylaxis - SCDs  Diet - Heart healthy / carb modified with thin liquids.  No antithrombotic prior to admission, now on aspirin 325 mg daily  Patient counseled to be compliant with her antithrombotic medications  Ongoing aggressive stroke  risk factor management  Therapy recommendations:  pending  Disposition:  Pending  Hypertension  Stable . Permissive hypertension (OK if < 220/120) but gradually normalize in 5-7 days . Long-term BP goal normotensive  Hyperlipidemia  Lipid lowering medication PTA:  Lipitor 20 mg daily  LDL 174, goal < 70  Current lipid lowering medication:  Lipitor 40 mg daily  Continue statin at discharge  Diabetes  HgbA1c 9,9, goal < 7.0  Uncontrolled  Other Stroke Risk Factors  Cigarette smoker - advised to stop smoking  Obesity, Body mass index is 29.8 kg/m., recommend weight loss, diet and exercise as appropriate   Polysubstance abuse - +cocaine and THU  Other Active Problems  Mild fusiform dilatation of the internal carotid artery below the skull base with subsequent narrowing to the cavernous carotid bilaterally. Question FMD. May need further eval such as cerebral angiogram.   Abnormal EKG  Substance abuse, +Cocaine  Plan  Loop Monday -> Trish notified. (Pt does not need to be NPO for loop)  Mild anemia  Mild hypokalemia - 3.4  Hypercoag panel ordered  Cerebral angio to eval for FMD? Neuro team on Monday to address possible outpatient follow up  Cannot perform TEE due to Covid, may consider TCD or U/S doppler considering patient is so young. Neuro team on Monday to address.  Needs social work for polysubstance abuse, [facility]  Hospital day # 0  Personally examined patient and images, and have participated in and made any corrections needed to history, physical, neuro exam,assessment and plan as stated above.  I have personally obtained the history, evaluated lab date, reviewed imaging studies and agree with radiology interpretations.    Naomie DeanAntonia Ahern, MD Stroke Neurology   A total of 35 minutes was spent for the care of this patient, spent on counseling patient and family on different diagnostic and therapeutic options, counseling and coordination  of care, riskd ans benefits of management, compliance, or risk factor reduction and education.   To contact Stroke Continuity provider, please refer to [email or web address]. After hours, contact General Neurology

## 2019-01-23 NOTE — Progress Notes (Signed)
Patient ID: Ebony Matthews, female   DOB: September 12, 1981, 38 y.o.   MRN: [id number]  PROGRESS NOTE    Ebony EpleyRuby Ancona  WUJ:[id number] DOB: September 12, 1981 DOA: 01/22/2019 PCP: Patient, No Pcp Per   Brief Narrative:  38 year old female with history of hypertension, diabetes mellitus type 2 presented with difficulty with speech, right facial droop and dizziness along with right upper extremity weakness and numbness.  Initial CT of the head was negative for acute stroke.  Neurology was consulted.  Assessment & Plan:   Principal Problem:   TIA (transient ischemic attack) Active Problems:   Diabetes mellitus due to underlying condition without complications (HCC)   Essential hypertension, benign   Smoking  Probable acute stroke -MRI shows multifocal acute ischemia. -CT angiogram of the head and neck was negative for any large vessel occlusion next -2D echo pending -Neurology evaluation appreciated.  Follow further recommendations -PT/OT/SLP evaluation -Hemoglobin A1c 9.9; LDL 174 -Allow for permissive hypertension -Continue aspirin and statin.  Diabetes mellitus type 2 uncontrolled with hyperglycemia -Hemoglobin A1c 9.9.  Patient needs to be compliant with medications and follow-up. -She does not have a PCP.  Will consult care management. -Continue CBGs with SSI.  Hypertension -Blood pressure on the higher side.  Monitor.  Allow for permissive hypertension for now  Tobacco use -patient was counseled about tobacco cessation by admitting hospitalist.  Leukocytosis -Resolved  Hypokalemia Replace.  Repeat a.m. labs   DVT prophylaxis: SCDs Code Status: Full Family Communication: None at bedside Disposition Plan: Home once clinically improved and cleared by neurology  Consultants: Neurology  Procedures: None  Antimicrobials: None   Subjective: Patient seen and examined at bedside.  She feels that her symptoms are improving but still has some numbness in the right upper  extremity.  No current problems with swallowing.  No overnight fever or vomiting.  Objective: Vitals:   01/23/19 0220 01/23/19 0400 01/23/19 0600 01/23/19 0800  BP: (!) 152/94 (!) 153/81 (!) 174/97 (!) 154/96  Pulse: 80 87  86  Resp: 18 18    Temp: 97.6 F (36.4 C) 98.3 F (36.8 C) 98.6 F (37 C) 98.3 F (36.8 C)  TempSrc: Oral Oral Oral Oral  SpO2: 100% 99% 100% 100%  Weight:      Height:        Intake/Output Summary (Last 24 hours) at 01/23/2019 1015 Last data filed at 01/23/2019 0100 Gross per 24 hour  Intake 240 ml  Output --  Net 240 ml   Filed Weights   01/23/19 0012  Weight: 86.3 kg    Examination:  General exam: Appears calm and comfortable  Respiratory system: Bilateral decreased breath sounds at bases Cardiovascular system: S1 & S2 heard, Rate controlled Gastrointestinal system: Abdomen is nondistended, soft and nontender. Normal bowel sounds heard. Extremities: No cyanosis, clubbing, edema  Central nervous system: Alert and oriented. No focal neurological deficits. Moving.  Power is 5 x 5 in all 4 extremities.  Data Reviewed: I have personally reviewed following labs and imaging studies  CBC: Recent Labs  Lab 01/22/19 2011 01/23/19 0407  WBC 11.4* 9.4  NEUTROABS 6.7  --   HGB 12.9 11.7*  HCT 40.2 35.7*  MCV 85.4 84.2  PLT 193 187   Basic Metabolic Panel: Recent Labs  Lab 01/22/19 2011 01/22/19 2015 01/23/19 0407  NA 134*  --  138  K 3.5  --  3.4*  CL 101  --  106  CO2 20*  --  21*  GLUCOSE 270*  --  171*  BUN 10  --  8  CREATININE 1.03* 0.90 0.87  CALCIUM 9.4  --  9.0   GFR: Estimated Creatinine Clearance: 98 mL/min (by C-G formula based on SCr of 0.87 mg/dL). Liver Function Tests: Recent Labs  Lab 01/22/19 2011 01/23/19 0407  AST 18 13*  ALT 14 13  ALKPHOS 97 80  BILITOT 0.5 0.4  PROT 8.0 6.7  ALBUMIN 4.2 3.4*   No results for input(s): LIPASE, AMYLASE in the last 168 hours. No results for input(s): AMMONIA in the last 168  hours. Coagulation Profile: Recent Labs  Lab 01/22/19 2011  INR 1.0   Cardiac Enzymes: No results for input(s): CKTOTAL, CKMB, CKMBINDEX, TROPONINI in the last 168 hours. BNP (last 3 results) No results for input(s): PROBNP in the last 8760 hours. HbA1C: Recent Labs    01/23/19 0407  HGBA1C 9.9*   CBG: Recent Labs  Lab 01/22/19 1956 01/22/19 2345 01/23/19 0654  GLUCAP 242* 312* 154*   Lipid Profile: Recent Labs    01/23/19 0407  CHOL 234*  HDL 35*  LDLCALC 174*  TRIG 125  CHOLHDL 6.7   Thyroid Function Tests: Recent Labs    01/23/19 0407  TSH 1.522   Anemia Panel: No results for input(s): VITAMINB12, FOLATE, FERRITIN, TIBC, IRON, RETICCTPCT in the last 72 hours. Sepsis Labs: No results for input(s): PROCALCITON, LATICACIDVEN in the last 168 hours.  No results found for this or any previous visit (from the past 240 hour(s)).       Radiology Studies: Ct Angio Head W Or Wo Contrast  Result Date: 01/22/2019 CLINICAL DATA:  Right facial droop, slurred speech, right arm numbness. Diabetes and hypertension EXAM: CT ANGIOGRAPHY HEAD AND NECK TECHNIQUE: Multidetector CT imaging of the head and neck was performed using the standard protocol during bolus administration of intravenous contrast. Multiplanar CT image reconstructions and MIPs were obtained to evaluate the vascular anatomy. Carotid stenosis measurements (when applicable) are obtained utilizing NASCET criteria, using the distal internal carotid diameter as the denominator. CONTRAST:  84mL OMNIPAQUE IOHEXOL 350 MG/ML SOLN COMPARISON:  None. FINDINGS: CT HEAD FINDINGS Brain: No evidence of acute infarction, hemorrhage, hydrocephalus, extra-axial collection or mass lesion/mass effect. Vascular: Negative for hyperdense vessel Skull: Negative Sinuses: Negative Orbits: Negative Review of the MIP images confirms the above findings CTA NECK FINDINGS Aortic arch: Standard branching. Imaged portion shows no evidence of  aneurysm or dissection. No significant stenosis of the major arch vessel origins. Right carotid system: Right carotid bifurcation widely patent. Mild fusiform dilatation of the distal right internal carotid artery below the skull base with mild irregularity, question mild FMD. No significant stenosis Left carotid system: Similar findings to the right carotid with mild fusiform dilatation of the internal carotid artery below the skull base. Mild narrowing at the origin of the left internal carotid artery. No calcification or aneurysm. No significant stenosis. Vertebral arteries: Both vertebral arteries widely patent to the basilar without stenosis. Skeleton: No acute skeletal abnormality. Poor dentition with numerous caries. Other neck: Negative for soft tissue mass or adenopathy. Upper chest: Negative Review of the MIP images confirms the above findings CTA HEAD FINDINGS Anterior circulation: Mild narrowing of the right cavernous carotid compared to the right internal carotid artery below the skull base. Right anterior and middle cerebral arteries widely patent Diffuse narrowing left cavernous carotid with mild-to-moderate focal stenosis proximal cavernous carotid. Left anterior and middle cerebral arteries widely patent. Posterior circulation: Both vertebral arteries patent to the basilar. PICA patent bilaterally. Basilar widely patent.  Superior cerebellar and posterior cerebral arteries widely patent. Venous sinuses: Patent Anatomic variants: None Delayed phase: Normal enhancement postcontrast administration. Review of the MIP images confirms the above findings IMPRESSION: 1. Negative CT head 2. Negative for emergent large vessel occlusion 3. Mild fusiform dilatation of the internal carotid artery below the skull base with subsequent narrowing to the cavernous carotid bilaterally. Question FMD. No intracranial aneurysm. 4. These results were called by telephone at the time of interpretation on 01/22/2019 at 9:36 pm  to Dr. Caryl Pina , who verbally acknowledged these results. Electronically Signed   By: Marlan Palau M.D.   On: 01/22/2019 21:36   Dg Chest 2 View  Result Date: 01/23/2019 CLINICAL DATA:  Dizziness EXAM: CHEST - 2 VIEW COMPARISON:  07/29/2016 FINDINGS: The heart size and mediastinal contours are within normal limits. Both lungs are clear. The visualized skeletal structures are unremarkable. IMPRESSION: No active cardiopulmonary disease. Electronically Signed   By: Deatra Robinson M.D.   On: 01/23/2019 02:35   Ct Angio Neck W Or Wo Contrast  Result Date: 01/22/2019 CLINICAL DATA:  Right facial droop, slurred speech, right arm numbness. Diabetes and hypertension EXAM: CT ANGIOGRAPHY HEAD AND NECK TECHNIQUE: Multidetector CT imaging of the head and neck was performed using the standard protocol during bolus administration of intravenous contrast. Multiplanar CT image reconstructions and MIPs were obtained to evaluate the vascular anatomy. Carotid stenosis measurements (when applicable) are obtained utilizing NASCET criteria, using the distal internal carotid diameter as the denominator. CONTRAST:  75mL OMNIPAQUE IOHEXOL 350 MG/ML SOLN COMPARISON:  None. FINDINGS: CT HEAD FINDINGS Brain: No evidence of acute infarction, hemorrhage, hydrocephalus, extra-axial collection or mass lesion/mass effect. Vascular: Negative for hyperdense vessel Skull: Negative Sinuses: Negative Orbits: Negative Review of the MIP images confirms the above findings CTA NECK FINDINGS Aortic arch: Standard branching. Imaged portion shows no evidence of aneurysm or dissection. No significant stenosis of the major arch vessel origins. Right carotid system: Right carotid bifurcation widely patent. Mild fusiform dilatation of the distal right internal carotid artery below the skull base with mild irregularity, question mild FMD. No significant stenosis Left carotid system: Similar findings to the right carotid with mild fusiform dilatation  of the internal carotid artery below the skull base. Mild narrowing at the origin of the left internal carotid artery. No calcification or aneurysm. No significant stenosis. Vertebral arteries: Both vertebral arteries widely patent to the basilar without stenosis. Skeleton: No acute skeletal abnormality. Poor dentition with numerous caries. Other neck: Negative for soft tissue mass or adenopathy. Upper chest: Negative Review of the MIP images confirms the above findings CTA HEAD FINDINGS Anterior circulation: Mild narrowing of the right cavernous carotid compared to the right internal carotid artery below the skull base. Right anterior and middle cerebral arteries widely patent Diffuse narrowing left cavernous carotid with mild-to-moderate focal stenosis proximal cavernous carotid. Left anterior and middle cerebral arteries widely patent. Posterior circulation: Both vertebral arteries patent to the basilar. PICA patent bilaterally. Basilar widely patent. Superior cerebellar and posterior cerebral arteries widely patent. Venous sinuses: Patent Anatomic variants: None Delayed phase: Normal enhancement postcontrast administration. Review of the MIP images confirms the above findings IMPRESSION: 1. Negative CT head 2. Negative for emergent large vessel occlusion 3. Mild fusiform dilatation of the internal carotid artery below the skull base with subsequent narrowing to the cavernous carotid bilaterally. Question FMD. No intracranial aneurysm. 4. These results were called by telephone at the time of interpretation on 01/22/2019 at 9:36 pm to Dr. Caryl Pina ,  who verbally acknowledged these results. Electronically Signed   By: Marlan Palau M.D.   On: 01/22/2019 21:36   Mr Brain Wo Contrast  Result Date: 01/23/2019 CLINICAL DATA:  Near syncopal event with slurred speech and right upper extremity numbness. Right facial droop. Symptoms resolved after 5 minutes. EXAM: MRI HEAD WITHOUT CONTRAST TECHNIQUE: Multiplanar,  multiecho pulse sequences of the brain and surrounding structures were obtained without intravenous contrast. COMPARISON:  CTA head neck 01/22/2019 FINDINGS: BRAIN: There is multifocal acute ischemia, mostly within the left hemisphere, with punctate foci along the left precentral gyrus and within the posterior left parietal lobe. There is a single focus in the right hemispheric white matter and a small linear focus in the left occipital lobe. The midline structures are normal. No midline shift or other mass effect. Multifocal white matter hyperintensity, most commonly due to chronic ischemic microangiopathy. The cerebral and cerebellar volume are age-appropriate. No hydrocephalus. Susceptibility-sensitive sequences show no chronic microhemorrhage or superficial siderosis. No mass lesion. VASCULAR: The major intracranial arterial and venous sinus flow voids are normal. SKULL AND UPPER CERVICAL SPINE: Calvarial bone marrow signal is normal. There is no skull base mass. Visualized upper cervical spine and soft tissues are normal. SINUSES/ORBITS: No fluid levels or advanced mucosal thickening. No mastoid or middle ear effusion. The orbits are normal. IMPRESSION: 1. Multifocal acute ischemia, predominantly in the left hemisphere, although there is a single focus within the right frontal white matter. Punctate ischemic foci along the left motor strip are compatible with reported right upper extremity symptoms. 2. No hemorrhage or mass effect. 3. Mild findings of chronic ischemic microangiopathy. Electronically Signed   By: Deatra Robinson M.D.   On: 01/23/2019 02:35        Scheduled Meds:  aspirin EC  81 mg Oral Daily   atorvastatin  20 mg Oral Daily   insulin aspart  0-5 Units Subcutaneous QHS   insulin aspart  0-9 Units Subcutaneous TID WC   Continuous Infusions:   LOS: 0 days        Glade Lloyd, MD [facility] 01/23/2019, 10:15 AM

## 2019-01-23 NOTE — Progress Notes (Signed)
  Echocardiogram 2D Echocardiogram has been performed.  A 2 D limited echocardiogram was performed in accordance with the director's protocol in accordance to Covid 19, to limit patient to technician exposure.  Belva Chimes 01/23/2019, 9:17 AM

## 2019-01-23 NOTE — Progress Notes (Signed)
OT Cancellation Note  Patient Details Name: Ebony Matthews MRN: [id number] DOB: January 12, 1981   Cancelled Treatment:    Reason Eval/Treat Not Completed: OT screened, no needs identified, will sign off(Pt with R palm numbness with no focal deficits) Pt does not require OT as pt with good coordination, good symmetry and no weakness noted in BUEs. Pt able to perform ADL tasks with independence, no difficulty with R palm numbness present. OT signing off.  Revonda Standard Cecil Cranker) Glendell Docker OTR/L Acute Rehabilitation Services Pager: [phone] Office: [phone]   Lonzo Cloud 01/23/2019, 8:46 AM

## 2019-01-24 ENCOUNTER — Encounter (HOSPITAL_COMMUNITY)
Admission: EM | Disposition: A | Discharge status: Home / Self Care | Payer: Self-pay | Source: Home / Self Care | Attending: Internal Medicine

## 2019-01-24 ENCOUNTER — Inpatient Hospital Stay (HOSPITAL_COMMUNITY): Payer: Self-pay

## 2019-01-24 DIAGNOSIS — F1910 Other psychoactive substance abuse, uncomplicated: Secondary | ICD-10-CM

## 2019-01-24 DIAGNOSIS — I6389 Other cerebral infarction: Secondary | ICD-10-CM

## 2019-01-24 DIAGNOSIS — I639 Cerebral infarction, unspecified: Secondary | ICD-10-CM

## 2019-01-24 LAB — GLUCOSE, CAPILLARY
Glucose-Capillary: 125 mg/dL — ABNORMAL HIGH (ref 70–99)
Glucose-Capillary: 178 mg/dL — ABNORMAL HIGH (ref 70–99)
Glucose-Capillary: 129 mg/dL — ABNORMAL HIGH (ref 70–99)

## 2019-01-24 SURGERY — LOOP RECORDER INSERTION

## 2019-01-24 MED ORDER — ASPIRIN 325 MG PO TABS: 325.0000 mg | ORAL_TABLET | Freq: Every day | ORAL | 0 refills | Status: DC

## 2019-01-24 MED ORDER — ATORVASTATIN CALCIUM 40 MG PO TABS: 40.0000 mg | ORAL_TABLET | Freq: Every day | ORAL | 0 refills | Status: DC

## 2019-01-24 MED ORDER — GLIPIZIDE 5 MG PO TABS: 2.5000 mg | ORAL_TABLET | Freq: Two times a day (BID) | ORAL | 0 refills | Status: DC

## 2019-01-24 MED ORDER — LISINOPRIL 10 MG PO TABS: 10.0000 mg | ORAL_TABLET | Freq: Every day | ORAL | 0 refills | Status: DC

## 2019-01-24 MED ORDER — METFORMIN HCL 1000 MG PO TABS: 1000.0000 mg | ORAL_TABLET | Freq: Two times a day (BID) | ORAL | 0 refills | Status: DC

## 2019-01-24 NOTE — Progress Notes (Signed)
STROKE TEAM PROGRESS NOTE   SUBJECTIVE (INTERVAL HISTORY) Patient is up and around in the room.  She wants to go home.  TCD bubble study was performed at the bedside and was negative for PFO.  Patient has been extremely noncompliant with her medication regime and her high risk behavior hence I do not feel she would be a good candidate for loop recorder and long-term cardiac monitoring and anticoagulation hence would discontinue loop recorder plans.  Discussed with cardiology team  OBJECTIVE Vitals:   01/23/19 1547 01/23/19 2325 01/24/19 0420 01/24/19 0822  BP: (!) 166/86 (!) 170/87 (!) 162/101 (!) 173/91  Pulse: 79 80 78 79  Resp: 14  18 18   Temp: 98.3 F (36.8 C) 98.2 F (36.8 C) (!) 97.5 F (36.4 C) 98.9 F (37.2 C)  TempSrc: Oral Oral Oral Oral  SpO2: 99% 100% 95% 100%  Weight:      Height:        CBC:  Recent Labs  Lab 01/22/19 2011 01/23/19 0407  WBC 11.4* 9.4  NEUTROABS 6.7  --   HGB 12.9 11.7*  HCT 40.2 35.7*  MCV 85.4 84.2  PLT 193 187    Basic Metabolic Panel:  Recent Labs  Lab 01/22/19 2011 01/22/19 2015 01/23/19 0407  NA 134*  --  138  K 3.5  --  3.4*  CL 101  --  106  CO2 20*  --  21*  GLUCOSE 270*  --  171*  BUN 10  --  8  CREATININE 1.03* 0.90 0.87  CALCIUM 9.4  --  9.0    Lipid Panel:     Component Value Date/Time   CHOL 234 (H) 01/23/2019 0407   TRIG 125 01/23/2019 0407   HDL 35 (L) 01/23/2019 0407   CHOLHDL 6.7 01/23/2019 0407   VLDL 25 01/23/2019 0407   LDLCALC 174 (H) 01/23/2019 0407   HgbA1c:  Lab Results  Component Value Date   HGBA1C 9.9 (H) 01/23/2019   Urine Drug Screen:     Component Value Date/Time   LABOPIA NONE DETECTED 01/22/2019 2300   COCAINSCRNUR POSITIVE (A) 01/22/2019 2300   LABBENZ NONE DETECTED 01/22/2019 2300   AMPHETMU NONE DETECTED 01/22/2019 2300   THCU POSITIVE (A) 01/22/2019 2300   LABBARB NONE DETECTED 01/22/2019 2300    Alcohol Level     Component Value Date/Time   ETH <10 01/22/2019 2011     IMAGING  Ct Angio Head W Or Wo Contrast Ct Angio Neck W Or Wo Contrast 01/22/2019 IMPRESSION:  1. Negative CT head  2. Negative for emergent large vessel occlusion  3. Mild fusiform dilatation of the internal carotid artery below the skull base with subsequent narrowing to the cavernous carotid bilaterally. Question FMD. No intracranial aneurysm.   Dg Chest 2 View 01/23/2019 IMPRESSION:  No active cardiopulmonary disease.   Mr Brain Wo Contrast 01/23/2019 IMPRESSION:  1. Multifocal acute ischemia, predominantly in the left hemisphere, although there is a single focus within the right frontal white matter. Punctate ischemic foci along the left motor strip are compatible with reported right upper extremity symptoms.  2. No hemorrhage or mass effect.  3. Mild findings of chronic ischemic microangiopathy.     Transthoracic Echocardiogram with bubble study 4/26//2020 IMPRESSIONS  1. The left ventricle has normal systolic function, with an ejection fraction of 55-60%. The cavity size was normal. Left ventricular diastolic Doppler parameters are consistent with impaired relaxation.  2. The right ventricle has normal systolc function. The cavity was normal.  There is no increase in right ventricular wall thickness.  3. No atrial level shunt detected by color flow Doppler. Agitated saline contrast was given intravenously to evaluate for intracardiac shunting. Saline contrast bubble study was negative, with no evidence of any interatrial shunt.  4. No intracardiac thrombi or masses were visualized.    EKG - SR rate 90 BPM. (See cardiology reading for complete details)    PHYSICAL EXAM: Blood pressure (!) 173/91, pulse 79, temperature 98.9 F (37.2 C), temperature source Oral, resp. rate 18, height 5\' 7"  (1.702 m), weight 86.3 kg, SpO2 100 %.  Physical exam: Exam: Obese young african [place] lady not in distress. . Afebrile. Head is nontraumatic. Neck is supple without bruit.     Cardiac exam no murmur or gallop. Lungs are clear to auscultation. Distal pulses are well felt.  Neuro: Detailed Neurologic Exam  Speech:    Speech is normal; fluent and spontaneous with normal comprehension.  Cognition:    The patient is oriented to person, place, and time;     recent and remote memory intact;     language fluent;     normal attention, concentration,     fund of knowledge Cranial Nerves:    The pupils are equal, round, and reactive to light.Visual fields are full to finger confrontation. Extraocular movements are intact. Trigeminal sensation is intact and the muscles of mastication are normal. The face is symmetric. The palate elevates in the midline. Hearing intact. Voice is normal. Shoulder shrug is normal. The tongue has normal motion without fasciculations.   Coordination:    Normal finger to nose and heel to shin. Normal rapid alternating movements.   Gait:    Heel-toe and tandem gait are normal.   Motor Observation:    No asymmetry, no atrophy, and no involuntary movements noted. Tone:    Normal muscle tone.    Posture:    Posture is normal. normal erect    Strength:    Strength is V/V in the upper and lower limbs.      Sensation: intact to LT     Reflex Exam:  DTR's:    Deep tendon reflexes in the upper and lower extremities are normal bilaterally.   Toes:    The toes are downgoing bilaterally.   Clonus:    Clonus is absent.  ASSESSMENT/PLAN [name] is a 38 y.o. female with history of DM, HTN, tobacco use, polysubstance abuse and chronic low back pain presenting with sudden onset at work of near-syncope, slurred speech, RUE sensory numbness ("like it fell asleep"), RUE weakness/incoordination and right sided facial droop.  Not taking medications at home. Uncontrolled diabetes, uncontrolled HLD, current smoker.   Stroke:  Multiple bilateral infarcts - embolic - unknown source, UDS +Cocaine may consider this as etiology however needs  thorough eval due to young age  Resultant  resolved  CT head -  Negative CT head   MRI head -  Multifocal acute ischemia, predominantly in the left hemisphere, although there is a single focus within the right frontal white matter. Punctate ischemic foci along the left motor strip are compatible with reported right upper extremity symptoms.   MRA head - not performed  CTA H&N - Mild fusiform dilatation of the internal carotid artery below the skull base with subsequent narrowing to the cavernous carotid bilaterally. Question FMD  Carotid Doppler - CTA neck performed - carotid dopplers not indicated.  2D Echo  - EF 60 - 65%. No cardiac source of emboli  identified.   TCD Bubble study negative  LDL - 174  HgbA1c - 9.9  UDS - Cocaine and THCU  VTE prophylaxis - SCDs  Diet - Heart healthy / carb modified with thin liquids.  No antithrombotic prior to admission, now on aspirin 325 mg daily  Patient counseled to be compliant with her antithrombotic medications  Ongoing aggressive stroke risk factor management  Therapy recommendations:  none  Disposition:  home  Hypertension  Stable . Permissive hypertension (OK if < 220/120) but gradually normalize in 5-7 days . Long-term BP goal normotensive  Hyperlipidemia  Lipid lowering medication PTA:  Lipitor 20 mg daily  LDL 174, goal < 70  Current lipid lowering medication:  Lipitor 40 mg daily  Continue statin at discharge  Diabetes  HgbA1c 9,9, goal < 7.0  Uncontrolled  Other Stroke Risk Factors  Cigarette smoker - advised to stop smoking  Obesity, Body mass index is 29.8 kg/m., recommend weight loss, diet and exercise as appropriate   Polysubstance abuse - +cocaine and THU  Other Active Problems  Mild fusiform dilatation of the internal carotid artery below the skull base with subsequent narrowing to the cavernous carotid bilaterally. Question FMD. May need further eval such as cerebral angiogram.    Abnormal EKG  Substance abuse, +Cocaine  Plan   Mild anemia  Mild hypokalemia - 3.4  Hypercoag panel ordered  Cerebral angio to eval for FMD? Neuro team on Monday to address possible outpatient follow up  Cannot perform TEE due to Covid,   Needs social work for polysubstance abuse, +Cocaine and [facility] day # 1 I have personally obtained history,examined this patient, reviewed notes, independently viewed imaging studies, participated in medical decision making and plan of care.ROS completed by me personally and pertinent positives fully documented  I have made any additions or clarifications directly to the above note.  Patient has a high risk behavior with substance abuse as well as medication noncompliance since I do not believe she is a good long-term candidate for anticoagulation hence loop recorder will be canceled.  Patient can be discharged home on antiplatelet therapy and aggressive risk factor modification and behavioral modification.  Follow-up as an outpatient in the stroke clinic.  Discussed with patient and Dr. Jaclynn Major.  Greater than 50% time during this 25-minute visit were spent on counseling and coordination of care about her stroke and answering questions.  Delia Heady, MD Medical Director [facility] Pager: [phone] 01/24/2019 4:42 PM   To contact Stroke Continuity provider, please refer to [email or web address]. After hours, contact General Neurology

## 2019-01-24 NOTE — TOC Transition Note (Addendum)
Transition of Care [facility]) - CM/SW Discharge Note   Patient Details  Name: Ebony Matthews MRN: [id number] Date of Birth: July 21, 1981  Transition of Care [facility]) CM/SW Contact:  Kermit Balo, RN Phone Number: 01/24/2019, 11:27 AM   Clinical Narrative:    Pt discharging home today. Pt states she has insurance. She is going to provide copy of card after she gets home. CM was able to get her a PCP appt and information on the AVS.  Pt has transport home.  Meds should be affordable if she uses her insurance at [facility]. Pt also provided a good rx coupon card.     Final next level of care: Home/Self Care Barriers to Discharge: Barriers Resolved   Patient Goals and CMS Choice Patient states their goals for this hospitalization and ongoing recovery are:: to get home      Discharge Placement                       Discharge Plan and Services   Discharge Planning Services: CM Consult, Other - See comment(PCP)                                 Social Determinants of Health (SDOH) Interventions     Readmission Risk Interventions No flowsheet data found.

## 2019-01-24 NOTE — Progress Notes (Signed)
Physical Therapy Treatment Patient Details Name: Ebony Matthews MRN: [id number] DOB: 1981/06/23 Today's Date: 01/24/2019    History of Present Illness  Ebony Matthews  is a 38 y.o. female,w hypertension, dm2, who presents with c/o difficulty with speech, right facial droop, dizziness (no vertigo),  as well as right upper extremity weakness and numbness, starting about 6:30pm while at [employer].  Pt states that her symptoms lasted about 5-10 minutes and disappeared.  Pt denies fever, chills, cough, cp, palp, sob, n/v, abd pain, diarrhea, brbpr, dysuria.  Pt notes she has been noncompliant with her metformin as well as not taking her bp or cholesterol medication.    PT Comments    Pt with improved ambulation distance this session with no signs of unsteadiness or fatigue during or after. Pt with WNL gait and stair navigation, and performed well on challenges to balance this session. Per pt report, she feels back to baseline with mobility, and states R hand numbness and tingling is gone. UE and LE strength WNL and equal when assessed this session. PT continuing to recommend no PT follow up upon d/c.    Follow Up Recommendations  No PT follow up;Supervision - Intermittent     Equipment Recommendations  None recommended by PT    Recommendations for Other Services       Precautions / Restrictions Precautions Precautions: None Restrictions Weight Bearing Restrictions: No    Mobility  Bed Mobility Overal bed mobility: Modified Independent             General bed mobility comments: Increased time to come to sitting, no physical assist provided.   Transfers Overall transfer level: Modified independent               General transfer comment: Mod I for transfer for use of hands to push up from bed, no physical assist provided by PT for rising or steadying upon standing.   Ambulation/Gait Ambulation/Gait assistance: Supervision Gait Distance (Feet): 350 Feet Assistive  device: None Gait Pattern/deviations: Step-through pattern;WFL(Within Functional Limits) Gait velocity: slightly decr  Gait velocity interpretation: >2.62 ft/sec, indicative of community ambulatory General Gait Details: Supervision for safety, no unsteadiness noted even with challenges to gait (see balance section);  WFL step length this session.    Stairs Stairs: Yes Stairs assistance: Supervision Stair Management: No rails;Alternating pattern;Forwards Number of Stairs: 12 General stair comments: step-over-step stair navigation with no use of railing this session. Supervision for safety, no unsteadiness noted.    Wheelchair Mobility    Modified Rankin (Stroke Patients Only)       Balance Overall balance assessment: Modified Independent               Single Leg Stance - Right Leg: 25 Single Leg Stance - Left Leg: 20         High level balance activites: Backward walking;Direction changes;Other (comment) High Level Balance Comments: backward walking, turning in hallway, and tandem walking WNL, no LOB or unsteadiness noted.             Cognition Arousal/Alertness: Awake/alert Behavior During Therapy: WFL for tasks assessed/performed Overall Cognitive Status: Within Functional Limits for tasks assessed                                        Exercises      General Comments General comments (skin integrity, edema, etc.): VSS      Pertinent Vitals/Pain  Pain Assessment: No/denies pain    Home Living                      Prior Function            PT Goals (current goals can now be found in the care plan section) Acute Rehab PT Goals Patient Stated Goal: home PT Goal Formulation: With patient Time For Goal Achievement: 02/06/19 Potential to Achieve Goals: Good Progress towards PT goals: Progressing toward goals    Frequency    Min 3X/week      PT Plan (unable to locate RN post-session)    Co-evaluation               AM-PAC PT "6 Clicks" Mobility   Outcome Measure  Help needed turning from your back to your side while in a flat bed without using bedrails?: None Help needed moving from lying on your back to sitting on the side of a flat bed without using bedrails?: None Help needed moving to and from a bed to a chair (including a wheelchair)?: None Help needed standing up from a chair using your arms (e.g., wheelchair or bedside chair)?: None Help needed to walk in hospital room?: A Little Help needed climbing 3-5 steps with a railing? : A Little 6 Click Score: 22    End of Session   Activity Tolerance: Patient tolerated treatment well Patient left: in bed;with call bell/phone within reach Nurse Communication: Mobility status PT Visit Diagnosis: Difficulty in walking, not elsewhere classified (R26.2)     Time: [id number] PT Time Calculation (min) (ACUTE ONLY): 11 min  Charges:  $Gait Training: 8-22 mins                     [name], PT Acute Rehabilitation Services Pager [phone]  Office [phone]    Ebony Matthews 01/24/2019, 11:26 AM

## 2019-01-24 NOTE — Discharge Summary (Signed)
Physician Discharge Summary  Ebony Matthews RUE:[id number] DOB: 07/04/1981 DOA: 01/22/2019  PCP: Patient, No Pcp Per  Admit date: 01/22/2019 Discharge date: 01/24/2019  Admitted From: Home Disposition: Home  Recommendations for Outpatient Follow-up:  1. Follow up with PCP in 1 week 2. Follow-up with neurology as an outpatient 3. Follow up in ED if symptoms worsen or new appear   Home Health: No Equipment/Devices: None  Discharge Condition: Stable CODE STATUS: Full Diet recommendation: Heart healthy  Brief/Interim Summary: 38 year old female with history of hypertension, diabetes mellitus type 2 presented with difficulty with speech, right facial droop and dizziness along with right upper extremity weakness and numbness.  Initial CT of the head was negative for acute stroke.  Neurology was consulted.  MRI of the brain showed multifocal acute ischemia.  She was started on aspirin and Lipitor.  Her symptoms have much improved.  Neurology has cleared the patient for discharge.  She will be discharged home today.  Discharge Diagnoses:  Principal Problem:   TIA (transient ischemic attack) Active Problems:   Diabetes mellitus due to underlying condition without complications (HCC)   Essential hypertension, benign   Smoking  Acute stroke: Most likely embolic; unknown source -MRI shows multifocal acute ischemia. -CT angiogram of the head and neck was negative for any large vessel occlusion next -Echo Limited bubble study showed EF of 55 to 60% with no shunt detected.  No intracardiac thrombi or masses were visualized. -Neurology evaluation and follow-up appreciated.  Neurology is okay for the patient to be discharged on aspirin 325 mg daily and Lipitor. -PT/OT/SLP evaluation appreciated.  Tolerating diet.  No PT/OT follow-up needed. -Symptoms have much improved/almost resolved -Hemoglobin A1c 9.9; LDL 174 -Allowed for permissive hypertension -Hypercoagulable work-up sent.  This can be  followed up as an outpatient by neurology -As per neurology: Cannot perform TEE due to COVID-19, may consider TCD.  Neurology will decide regarding the same - initially there was a plan that patient might need loop recorder placement but neurology has subsequently decided that patient does not need loop recorder placement at this time.   Diabetes mellitus type 2 uncontrolled with hyperglycemia -Hemoglobin A1c 9.9.  Patient needs to be compliant with medications and follow-up. -She does not have a PCP.  Will consult care management. -Patient has not been taking her meds for the last year.  Will discharge on metformin.  She used to be on glipizide in the past.  We will not start it for now and will let PCP decide about that.  Hypertension -Blood pressure on the higher side.    Patient needs to be on lisinopril in the past but has not been on it for a while now.  Will put her on lisinopril 10 mg daily to be started on 01/27/2019.  Tobacco use -patient was counseled about tobacco cessation by admitting hospitalist.  Drug abuse -Urine drug screen was positive for cocaine and tetrahydrocannabinol.  Counseled regarding abstinence from illicit drugs.  Leukocytosis -Resolved  Hypokalemia Replaced during hospitalization.     Discharge Instructions  Discharge Instructions    Ambulatory referral to Neurology   Complete by:  As directed    An appointment is requested in approximately: few weeks for follow up of stroke   Diet - low sodium heart healthy   Complete by:  As directed    Increase activity slowly   Complete by:  As directed      Allergies as of 01/24/2019   No Known Allergies     Medication  List    STOP taking these medications   acetaminophen 325 MG tablet Commonly known as:  Tylenol   cephALEXin 500 MG capsule Commonly known as:  Keflex   clindamycin 300 MG capsule Commonly known as:  CLEOCIN   cyclobenzaprine 10 MG tablet Commonly known as:  FLEXERIL    glipiZIDE 2.5 mg Tabs tablet Commonly known as:  GLUCOTROL   HYDROcodone-acetaminophen 5-325 MG tablet Commonly known as:  NORCO/VICODIN   ibuprofen 600 MG tablet Commonly known as:  ADVIL   naproxen sodium 220 MG tablet Commonly known as:  ALEVE   varenicline 0.5 MG X 11 & 1 MG X 42 tablet Commonly known as:  Chantix Starting Month Pak   Vitamin D (Ergocalciferol) 1.25 MG (50000 UT) Caps capsule Commonly known as:  DRISDOL     TAKE these medications   aspirin 325 MG tablet Take 1 tablet (325 mg total) by mouth daily.   atorvastatin 40 MG tablet Commonly known as:  LIPITOR Take 1 tablet (40 mg total) by mouth daily at 6 PM. What changed:    medication strength  how much to take  when to take this   lisinopril 10 MG tablet Commonly known as:  ZESTRIL Take 1 tablet (10 mg total) by mouth daily for 30 days. Start taking on:  January 27, 2019 What changed:  These instructions start on January 27, 2019. If you are unsure what to do until then, ask your doctor or other care provider.   metFORMIN 1000 MG tablet Commonly known as:  GLUCOPHAGE Take 1 tablet (1,000 mg total) by mouth 2 (two) times daily.      Follow-up Information    PCP. Schedule an appointment as soon as possible for a visit in 1 week(s).          No Known Allergies  Consultations:  Neurology/EP   Procedures/Studies: Ct Angio Head W Or Wo Contrast  Result Date: 01/22/2019 CLINICAL DATA:  Right facial droop, slurred speech, right arm numbness. Diabetes and hypertension EXAM: CT ANGIOGRAPHY HEAD AND NECK TECHNIQUE: Multidetector CT imaging of the head and neck was performed using the standard protocol during bolus administration of intravenous contrast. Multiplanar CT image reconstructions and MIPs were obtained to evaluate the vascular anatomy. Carotid stenosis measurements (when applicable) are obtained utilizing NASCET criteria, using the distal internal carotid diameter as the denominator.  CONTRAST:  75mL OMNIPAQUE IOHEXOL 350 MG/ML SOLN COMPARISON:  None. FINDINGS: CT HEAD FINDINGS Brain: No evidence of acute infarction, hemorrhage, hydrocephalus, extra-axial collection or mass lesion/mass effect. Vascular: Negative for hyperdense vessel Skull: Negative Sinuses: Negative Orbits: Negative Review of the MIP images confirms the above findings CTA NECK FINDINGS Aortic arch: Standard branching. Imaged portion shows no evidence of aneurysm or dissection. No significant stenosis of the major arch vessel origins. Right carotid system: Right carotid bifurcation widely patent. Mild fusiform dilatation of the distal right internal carotid artery below the skull base with mild irregularity, question mild FMD. No significant stenosis Left carotid system: Similar findings to the right carotid with mild fusiform dilatation of the internal carotid artery below the skull base. Mild narrowing at the origin of the left internal carotid artery. No calcification or aneurysm. No significant stenosis. Vertebral arteries: Both vertebral arteries widely patent to the basilar without stenosis. Skeleton: No acute skeletal abnormality. Poor dentition with numerous caries. Other neck: Negative for soft tissue mass or adenopathy. Upper chest: Negative Review of the MIP images confirms the above findings CTA HEAD FINDINGS Anterior circulation: Mild narrowing  of the right cavernous carotid compared to the right internal carotid artery below the skull base. Right anterior and middle cerebral arteries widely patent Diffuse narrowing left cavernous carotid with mild-to-moderate focal stenosis proximal cavernous carotid. Left anterior and middle cerebral arteries widely patent. Posterior circulation: Both vertebral arteries patent to the basilar. PICA patent bilaterally. Basilar widely patent. Superior cerebellar and posterior cerebral arteries widely patent. Venous sinuses: Patent Anatomic variants: None Delayed phase: Normal  enhancement postcontrast administration. Review of the MIP images confirms the above findings IMPRESSION: 1. Negative CT head 2. Negative for emergent large vessel occlusion 3. Mild fusiform dilatation of the internal carotid artery below the skull base with subsequent narrowing to the cavernous carotid bilaterally. Question FMD. No intracranial aneurysm. 4. These results were called by telephone at the time of interpretation on 01/22/2019 at 9:36 pm to Dr. Caryl Pina , who verbally acknowledged these results. Electronically Signed   By: Marlan Palau M.D.   On: 01/22/2019 21:36   Dg Chest 2 View  Result Date: 01/23/2019 CLINICAL DATA:  Dizziness EXAM: CHEST - 2 VIEW COMPARISON:  07/29/2016 FINDINGS: The heart size and mediastinal contours are within normal limits. Both lungs are clear. The visualized skeletal structures are unremarkable. IMPRESSION: No active cardiopulmonary disease. Electronically Signed   By: Deatra Robinson M.D.   On: 01/23/2019 02:35   Ct Angio Neck W Or Wo Contrast  Result Date: 01/22/2019 CLINICAL DATA:  Right facial droop, slurred speech, right arm numbness. Diabetes and hypertension EXAM: CT ANGIOGRAPHY HEAD AND NECK TECHNIQUE: Multidetector CT imaging of the head and neck was performed using the standard protocol during bolus administration of intravenous contrast. Multiplanar CT image reconstructions and MIPs were obtained to evaluate the vascular anatomy. Carotid stenosis measurements (when applicable) are obtained utilizing NASCET criteria, using the distal internal carotid diameter as the denominator. CONTRAST:  70mL OMNIPAQUE IOHEXOL 350 MG/ML SOLN COMPARISON:  None. FINDINGS: CT HEAD FINDINGS Brain: No evidence of acute infarction, hemorrhage, hydrocephalus, extra-axial collection or mass lesion/mass effect. Vascular: Negative for hyperdense vessel Skull: Negative Sinuses: Negative Orbits: Negative Review of the MIP images confirms the above findings CTA NECK FINDINGS Aortic  arch: Standard branching. Imaged portion shows no evidence of aneurysm or dissection. No significant stenosis of the major arch vessel origins. Right carotid system: Right carotid bifurcation widely patent. Mild fusiform dilatation of the distal right internal carotid artery below the skull base with mild irregularity, question mild FMD. No significant stenosis Left carotid system: Similar findings to the right carotid with mild fusiform dilatation of the internal carotid artery below the skull base. Mild narrowing at the origin of the left internal carotid artery. No calcification or aneurysm. No significant stenosis. Vertebral arteries: Both vertebral arteries widely patent to the basilar without stenosis. Skeleton: No acute skeletal abnormality. Poor dentition with numerous caries. Other neck: Negative for soft tissue mass or adenopathy. Upper chest: Negative Review of the MIP images confirms the above findings CTA HEAD FINDINGS Anterior circulation: Mild narrowing of the right cavernous carotid compared to the right internal carotid artery below the skull base. Right anterior and middle cerebral arteries widely patent Diffuse narrowing left cavernous carotid with mild-to-moderate focal stenosis proximal cavernous carotid. Left anterior and middle cerebral arteries widely patent. Posterior circulation: Both vertebral arteries patent to the basilar. PICA patent bilaterally. Basilar widely patent. Superior cerebellar and posterior cerebral arteries widely patent. Venous sinuses: Patent Anatomic variants: None Delayed phase: Normal enhancement postcontrast administration. Review of the MIP images confirms the above findings  IMPRESSION: 1. Negative CT head 2. Negative for emergent large vessel occlusion 3. Mild fusiform dilatation of the internal carotid artery below the skull base with subsequent narrowing to the cavernous carotid bilaterally. Question FMD. No intracranial aneurysm. 4. These results were called by  telephone at the time of interpretation on 01/22/2019 at 9:36 pm to Dr. Caryl Pina , who verbally acknowledged these results. Electronically Signed   By: Marlan Palau M.D.   On: 01/22/2019 21:36   Mr Brain Wo Contrast  Result Date: 01/23/2019 CLINICAL DATA:  Near syncopal event with slurred speech and right upper extremity numbness. Right facial droop. Symptoms resolved after 5 minutes. EXAM: MRI HEAD WITHOUT CONTRAST TECHNIQUE: Multiplanar, multiecho pulse sequences of the brain and surrounding structures were obtained without intravenous contrast. COMPARISON:  CTA head neck 01/22/2019 FINDINGS: BRAIN: There is multifocal acute ischemia, mostly within the left hemisphere, with punctate foci along the left precentral gyrus and within the posterior left parietal lobe. There is a single focus in the right hemispheric white matter and a small linear focus in the left occipital lobe. The midline structures are normal. No midline shift or other mass effect. Multifocal white matter hyperintensity, most commonly due to chronic ischemic microangiopathy. The cerebral and cerebellar volume are age-appropriate. No hydrocephalus. Susceptibility-sensitive sequences show no chronic microhemorrhage or superficial siderosis. No mass lesion. VASCULAR: The major intracranial arterial and venous sinus flow voids are normal. SKULL AND UPPER CERVICAL SPINE: Calvarial bone marrow signal is normal. There is no skull base mass. Visualized upper cervical spine and soft tissues are normal. SINUSES/ORBITS: No fluid levels or advanced mucosal thickening. No mastoid or middle ear effusion. The orbits are normal. IMPRESSION: 1. Multifocal acute ischemia, predominantly in the left hemisphere, although there is a single focus within the right frontal white matter. Punctate ischemic foci along the left motor strip are compatible with reported right upper extremity symptoms. 2. No hemorrhage or mass effect. 3. Mild findings of chronic  ischemic microangiopathy. Electronically Signed   By: Deatra Robinson M.D.   On: 01/23/2019 02:35       Subjective: Patient seen and examined at bedside.  She denies any overnight fever, nausea, vomiting, weakness.  Her numbness has improved.  Discharge Exam: Vitals:   01/24/19 0420 01/24/19 0822  BP: (!) 162/101 (!) 173/91  Pulse: 78 79  Resp: 18 18  Temp: (!) 97.5 F (36.4 C) 98.9 F (37.2 C)  SpO2: 95% 100%    General: Pt is alert, awake, not in acute distress Cardiovascular: rate controlled, S1/S2 + Respiratory: bilateral decreased breath sounds at bases Abdominal: Soft, NT, ND, bowel sounds + Extremities: no edema, no cyanosis    The results of significant diagnostics from this hospitalization (including imaging, microbiology, ancillary and laboratory) are listed below for reference.     Microbiology: No results found for this or any previous visit (from the past 240 hour(s)).   Labs: BNP (last 3 results) No results for input(s): BNP in the last 8760 hours. Basic Metabolic Panel: Recent Labs  Lab 01/22/19 2011 01/22/19 2015 01/23/19 0407  NA 134*  --  138  K 3.5  --  3.4*  CL 101  --  106  CO2 20*  --  21*  GLUCOSE 270*  --  171*  BUN 10  --  8  CREATININE 1.03* 0.90 0.87  CALCIUM 9.4  --  9.0   Liver Function Tests: Recent Labs  Lab 01/22/19 2011 01/23/19 0407  AST 18 13*  ALT 14  13  ALKPHOS 97 80  BILITOT 0.5 0.4  PROT 8.0 6.7  ALBUMIN 4.2 3.4*   No results for input(s): LIPASE, AMYLASE in the last 168 hours. No results for input(s): AMMONIA in the last 168 hours. CBC: Recent Labs  Lab 01/22/19 2011 01/23/19 0407  WBC 11.4* 9.4  NEUTROABS 6.7  --   HGB 12.9 11.7*  HCT 40.2 35.7*  MCV 85.4 84.2  PLT 193 187   Cardiac Enzymes: No results for input(s): CKTOTAL, CKMB, CKMBINDEX, TROPONINI in the last 168 hours. BNP: Invalid input(s): POCBNP CBG: Recent Labs  Lab 01/23/19 0654 01/23/19 1215 01/23/19 1633 01/23/19 2117  01/24/19 0630  GLUCAP 154* 155* 156* 183* 125*   D-Dimer No results for input(s): DDIMER in the last 72 hours. Hgb A1c Recent Labs    01/23/19 0407  HGBA1C 9.9*   Lipid Profile Recent Labs    01/23/19 0407  CHOL 234*  HDL 35*  LDLCALC 174*  TRIG 125  CHOLHDL 6.7   Thyroid function studies Recent Labs    01/23/19 0407  TSH 1.522   Anemia work up No results for input(s): VITAMINB12, FOLATE, FERRITIN, TIBC, IRON, RETICCTPCT in the last 72 hours. Urinalysis    Component Value Date/Time   COLORURINE YELLOW 01/22/2019 2300   APPEARANCEUR HAZY (A) 01/22/2019 2300   LABSPEC 1.041 (H) 01/22/2019 2300   PHURINE 6.0 01/22/2019 2300   GLUCOSEU >=500 (A) 01/22/2019 2300   HGBUR NEGATIVE 01/22/2019 2300   BILIRUBINUR NEGATIVE 01/22/2019 2300   KETONESUR 5 (A) 01/22/2019 2300   PROTEINUR NEGATIVE 01/22/2019 2300   UROBILINOGEN 0.2 02/22/2014 1730   NITRITE NEGATIVE 01/22/2019 2300   LEUKOCYTESUR NEGATIVE 01/22/2019 2300   Sepsis Labs Invalid input(s): PROCALCITONIN,  WBC,  LACTICIDVEN Microbiology No results found for this or any previous visit (from the past 240 hour(s)).   Time coordinating discharge: 35 minutes  SIGNED:   Glade LloydKshitiz Jermesha Sottile, MD  [facility] 01/24/2019, 10:33 AM

## 2019-01-24 NOTE — Progress Notes (Signed)
Pt discharge education and instructions completed with pt at bedside; pt voices understanding and denies any questions. Pt IV and telemetry removed; pt discharge home and pt to self transport herself home. Pt to pick up electronically sent prescriptions from preferred pharmacy on file. Pt transported off unit via wheelchair with belongings to the side. Dionne Bucy RN

## 2019-01-24 NOTE — Progress Notes (Addendum)
Inpatient Diabetes Program Recommendations  AACE/ADA: New Consensus Statement on Inpatient Glycemic Control (2015)  Target Ranges:  Prepandial:   less than 140 mg/dL      Peak postprandial:   less than 180 mg/dL (1-2 hours)      Critically ill patients:  140 - 180 mg/dL   Results for REKITA, FORESTA (MRN [id number]) as of 01/24/2019 10:15  Ref. Range 01/23/2019 06:54 01/23/2019 12:15 01/23/2019 16:33 01/23/2019 21:17  Glucose-Capillary Latest Ref Range: 70 - 99 mg/dL 557 (H) 322 (H) 025 (H) 183 (H)   Results for VERMELL, COLLUM (MRN [id number]) as of 01/24/2019 10:15  Ref. Range 01/24/2019 06:30  Glucose-Capillary Latest Ref Range: 70 - 99 mg/dL 283 (H)   Results for ZILLA, KLUNDT (MRN [id number]) as of 01/24/2019 10:15  Ref. Range 01/23/2019 04:07  Hemoglobin A1C Latest Ref Range: 4.8 - 5.6 % 9.9 (H)  (237 mg/dl)   Results for ISLAH, RAGGS (MRN [id number]) as of 01/24/2019 10:15  Ref. Range 01/22/2019 23:00  COCAINE Latest Ref Range: NONE DETECTED  POSITIVE (A)    Admit: TIA  History: DM, HTN  Home DM Meds: Metformin 1000 mg BID (NOT taking)        Glipizide 2.5 mg BID (NOT taking)   Current Orders: Novolog Moderate Correction Scale/ SSI (0-15 units) TID AC + HS     NO PCP listed.  NO insurance listed.  CBGs OK. Current A1c= 9.9% because pt has not been taking meds at home for more than 1 year.  Have placed consult to care management to assist pt in getting PCP after d/c.    MD- If you re-order/renew pt's Metformin and Glipizide at time of discharge, patient can get both of these meds at [facility] for $4 each.     --Will follow patient during hospitalization--  Ambrose Finland RN, MSN, CDE Diabetes Coordinator Inpatient Glycemic Control Team Team Pager: [phone] (8a-5p)

## 2019-01-24 NOTE — Progress Notes (Signed)
Transcranial Doppler with Bubbles completed. Preliminary results in Chart review CV Proc. [place] Ebony Matthews,RVS 01/24/2019, 4:16 PM

## 2019-01-24 NOTE — Consult Note (Signed)
ELECTROPHYSIOLOGY CONSULT NOTE  Patient ID: Ebony Matthews MRN: [id number], DOB/AGE: Mar 24, 1981   Admit date: 01/22/2019 Date of Consult: 01/24/2019  Primary Physician: Patient, No Pcp Per Primary Cardiologist:  Reason for Consultation: Cryptogenic stroke ; recommendations regarding Implantable Loop Recorder, requested by Dr. Lucia Gaskins  History of Present Illness Ebony Matthews was admitted on 01/22/2019 with slurred speech, R sided deficit, stroke.   PMHx includes: untreated DM, HTN, chronic back pain, smoker, marijuana and cocaine "sometimes"  Neurology noted:  Multiple bilateral infarcts - embolic - unknown source, UDS +Cocaine may consider this as etiology however needs thorough eval due to young age  she has undergone workup for stroke including echocardiogram/bubble study and carotid angio.  The patient has been monitored on telemetry which has demonstrated sinus rhythm with no arrhythmias.  Inpatient stroke work-up will not include TEE given COVID19 restrictions.   Echocardiogram this admission demonstrated IMPRESSIONS  1. The left ventricle has normal systolic function, with an ejection fraction of 55-60%. The cavity size was normal. Left ventricular diastolic Doppler parameters are consistent with impaired relaxation.  2. The right ventricle has normal systolc function. The cavity was normal. There is no increase in right ventricular wall thickness.  3. No atrial level shunt detected by color flow Doppler. Agitated saline contrast was given intravenously to evaluate for intracardiac shunting. Saline contrast bubble study was negative, with no evidence of any interatrial shunt.  4. No intracardiac thrombi or masses were visualized.  FINDINGS  Left Ventricle: The left ventricle has normal systolic function, with an ejection fraction of 55-60%. The cavity size was normal. There is no increase in left ventricular wall thickness. Left ventricular diastolic Doppler parameters are  consistent with  impaired relaxation (grade I).    Right Ventricle: The right ventricle has normal systolic function. The cavity was normal. There is no increase in right ventricular wall thickness.  Left Atrium: Left atrial size was normal in size.  Right Atrium: Right atrial size was normal in size. Right atrial pressure is estimated at 3 mmHg.  Interatrial Septum: No atrial level shunt detected by color flow Doppler. Agitated saline contrast was given intravenously to evaluate for intracardiac shunting. Saline contrast bubble study was negative, with no evidence of any interatrial shunt.  Pericardium: There is no evidence of pericardial effusion.  Mitral Valve: Mitral valve regurgitation is trivial by color flow Doppler.  Tricuspid Valve: Tricuspid valve regurgitation is trivial by color flow Doppler.  Aortic Valve: Aortic valve regurgitation was not visualized by color flow Doppler.  Pulmonic Valve: Pulmonic valve regurgitation was not assessed by color flow Doppler.  Venous: The inferior vena cava is normal in size with greater than 50% respiratory variability.    Lab work is reviewed.    Prior to admission, the patient denies chest pain, shortness of breath, dizziness, she has rare fleeting palpitations, no syncope.  She is recovered from their stroke with plans to home at discharge.    Past Medical History:  Diagnosis Date   Diabetes mellitus without complication (HCC)    Hypertension      Surgical History:  Past Surgical History:  Procedure Laterality Date   NO PAST SURGERIES       Medications Prior to Admission  Medication Sig Dispense Refill Last Dose   naproxen sodium (ALEVE) 220 MG tablet Take 440 mg by mouth 2 (two) times daily as needed (headache).   01/21/2019 at Unknown time   acetaminophen (TYLENOL) 325 MG tablet Take 2 tablets (650 mg total) by  mouth every 6 (six) hours as needed. (Patient not taking: Reported on 02/13/2018) 30 tablet 0  Not Taking at Unknown time   atorvastatin (LIPITOR) 20 MG tablet Take 1 tablet (20 mg total) by mouth daily. (Patient not taking: Reported on 02/13/2018) 90 tablet 3 Not Taking at Unknown time   cephALEXin (KEFLEX) 500 MG capsule 2 caps po bid x 7 days (Patient not taking: Reported on 02/13/2018) 28 capsule 0 Not Taking at Unknown time   clindamycin (CLEOCIN) 300 MG capsule Take 1 capsule (300 mg total) by mouth 4 (four) times daily. X 7 days (Patient not taking: Reported on 01/22/2019) 28 capsule 0 Not Taking at Unknown time   cyclobenzaprine (FLEXERIL) 10 MG tablet Take 1 tablet (10 mg total) by mouth 2 (two) times daily as needed for muscle spasms. (Patient not taking: Reported on 02/13/2018) 20 tablet 0 Not Taking at Unknown time   glipiZIDE (GLUCOTROL) 2.5 mg TABS tablet Take 0.5 tablets (2.5 mg total) by mouth 2 (two) times daily before a meal. (Patient not taking: Reported on 07/29/2016) 60 tablet 3 Not Taking at Unknown time   HYDROcodone-acetaminophen (NORCO/VICODIN) 5-325 MG per tablet Take 1 tablet by mouth every 6 (six) hours as needed. (Patient not taking: Reported on 07/29/2016) 6 tablet 0 Not Taking at Unknown time   ibuprofen (ADVIL,MOTRIN) 600 MG tablet Take 1 tablet (600 mg total) by mouth every 6 (six) hours as needed. (Patient not taking: Reported on 07/29/2016) 30 tablet 0 Not Taking at Unknown time   lisinopril (PRINIVIL,ZESTRIL) 10 MG tablet Take 1 tablet (10 mg total) by mouth daily. (Patient not taking: Reported on 01/22/2019) 30 tablet 0 Not Taking at Unknown time   metFORMIN (GLUCOPHAGE) 1000 MG tablet Take 1 tablet (1,000 mg total) by mouth 2 (two) times daily. (Patient not taking: Reported on 01/22/2019) 60 tablet 3 Not Taking at Unknown time   varenicline (CHANTIX STARTING MONTH PAK) 0.5 MG X 11 & 1 MG X 42 tablet Take one 0.5 mg tablet by mouth once daily for 3 days, then increase to one 0.5 mg tablet twice daily for 4 days, then increase to one 1 mg tablet twice daily.  (Patient not taking: Reported on 07/29/2016) 53 tablet 0 Not Taking at Unknown time   Vitamin D, Ergocalciferol, (DRISDOL) 50000 UNITS CAPS capsule Take 1 capsule (50,000 Units total) by mouth every 7 (seven) days. (Patient not taking: Reported on 07/29/2016) 12 capsule 0 Not Taking at Unknown time    Inpatient Medications:   aspirin  325 mg Oral Daily   atorvastatin  40 mg Oral q1800   insulin aspart  0-15 Units Subcutaneous TID WC   insulin aspart  0-5 Units Subcutaneous QHS    Allergies: No Known Allergies  Social History   Socioeconomic History   Marital status: Single    Spouse name: Not on file   Number of children: Not on file   Years of education: Not on file   Highest education level: Not on file  Occupational History   Not on file  Social Needs   Financial resource strain: Not on file   Food insecurity:    Worry: Not on file    Inability: Not on file   Transportation needs:    Medical: Not on file    Non-medical: Not on file  Tobacco Use   Smoking status: Current Every Day Smoker    Packs/day: 1.00    Years: 15.00    Pack years: 15.00   Smokeless tobacco:  Never Used  Substance and Sexual Activity   Alcohol use: No   Drug use: Yes    Types: Marijuana    Comment: occasional   Sexual activity: Yes    Birth control/protection: None  Lifestyle   Physical activity:    Days per week: Not on file    Minutes per session: Not on file   Stress: Not on file  Relationships   Social connections:    Talks on phone: Not on file    Gets together: Not on file    Attends religious service: Not on file    Active member of club or organization: Not on file    Attends meetings of clubs or organizations: Not on file    Relationship status: Not on file   Intimate partner violence:    Fear of current or ex partner: Not on file    Emotionally abused: Not on file    Physically abused: Not on file    Forced sexual activity: Not on file  Other Topics  Concern   Not on file  Social History Narrative   Not on file     Family History  Problem Relation Age of Onset   Diabetes Other    Hypertension Maternal Grandmother       Review of Systems: All other systems reviewed and are otherwise negative except as noted above.  Physical Exam: Vitals:   01/23/19 1203 01/23/19 1547 01/23/19 2325 01/24/19 0420  BP: (!) 146/86 (!) 166/86 (!) 170/87 (!) 162/101  Pulse: 91 79 80 78  Resp: 16 14  18   Temp: 97.8 F (36.6 C) 98.3 F (36.8 C) 98.2 F (36.8 C) (!) 97.5 F (36.4 C)  TempSrc: Oral Oral Oral Oral  SpO2: 100% 99% 100% 95%  Weight:      Height:         LIMITED EXAM given COVID19 GEN- The patient is well appearing, alert and oriented x 3 today.   Head- normocephalic, atraumatic Eyes-  Sclera clear, conjunctiva pink Ears- hearing intact Oropharynx- not examined Neck- supple Lungs-  Normal WOB Heart-  telemetry and echo reviwed, RRR GI- not examined Extremities- no clubbing, cyanosis, or edema MS- no obvious deformities Skin- no noted rash or lesions Psych- euthymic mood, full affect   Labs:   Lab Results  Component Value Date   WBC 9.4 01/23/2019   HGB 11.7 (L) 01/23/2019   HCT 35.7 (L) 01/23/2019   MCV 84.2 01/23/2019   PLT 187 01/23/2019    Recent Labs  Lab 01/23/19 0407  NA 138  K 3.4*  CL 106  CO2 21*  BUN 8  CREATININE 0.87  CALCIUM 9.0  PROT 6.7  BILITOT 0.4  ALKPHOS 80  ALT 13  AST 13*  GLUCOSE 171*   No results found for: CKTOTAL, CKMB, CKMBINDEX, TROPONINI Lab Results  Component Value Date   CHOL 234 (H) 01/23/2019   CHOL 258 (H) 05/30/2014   CHOL 213 (H) 10/16/2010   Lab Results  Component Value Date   HDL 35 (L) 01/23/2019   HDL 37 (L) 05/30/2014   HDL 33 (L) 10/16/2010   Lab Results  Component Value Date   LDLCALC 174 (H) 01/23/2019   LDLCALC 182 (H) 05/30/2014   LDLCALC 159 (H) 10/16/2010   Lab Results  Component Value Date   TRIG 125 01/23/2019   TRIG 196 (H)  05/30/2014   TRIG 104 10/16/2010   Lab Results  Component Value Date   CHOLHDL 6.7 01/23/2019  CHOLHDL 7.0 05/30/2014   CHOLHDL 6.5 Ratio 10/16/2010   No results found for: LDLDIRECT  Lab Results  Component Value Date   DDIMER 0.70 (H) 12/27/2012     Radiology/Studies:   Ct Angio Head W Or Wo Contrast Result Date: 01/22/2019 CLINICAL DATA:  Right facial droop, slurred speech, right arm numbness. Diabetes and hypertension EXAM: CT ANGIOGRAPHY HEAD AND NECK TECHNIQUE: Multidetector CT imaging of the head and neck was performed using the standard protocol during bolus administration of intravenous contrast. Multiplanar CT image reconstructions and MIPs were obtained to evaluate the vascular anatomy. Carotid stenosis measurements (when applicable) are obtained utilizing NASCET criteria, using the distal internal carotid diameter as the denominator. CONTRAST:  75mL OMNIPAQUE IOHEXOL 350 MG/ML SOLN COMPARISON:  None. FINDINGS: CT HEAD FINDINGS Brain: No evidence of acute infarction, hemorrhage, hydrocephalus, extra-axial collection or mass lesion/mass effect. Vascular: Negative for hyperdense vessel Skull: Negative Sinuses: Negative Orbits: Negative Review of the MIP images confirms the above findings CTA NECK FINDINGS Aortic arch: Standard branching. Imaged portion shows no evidence of aneurysm or dissection. No significant stenosis of the major arch vessel origins. Right carotid system: Right carotid bifurcation widely patent. Mild fusiform dilatation of the distal right internal carotid artery below the skull base with mild irregularity, question mild FMD. No significant stenosis Left carotid system: Similar findings to the right carotid with mild fusiform dilatation of the internal carotid artery below the skull base. Mild narrowing at the origin of the left internal carotid artery. No calcification or aneurysm. No significant stenosis. Vertebral arteries: Both vertebral arteries widely patent to  the basilar without stenosis. Skeleton: No acute skeletal abnormality. Poor dentition with numerous caries. Other neck: Negative for soft tissue mass or adenopathy. Upper chest: Negative Review of the MIP images confirms the above findings CTA HEAD FINDINGS Anterior circulation: Mild narrowing of the right cavernous carotid compared to the right internal carotid artery below the skull base. Right anterior and middle cerebral arteries widely patent Diffuse narrowing left cavernous carotid with mild-to-moderate focal stenosis proximal cavernous carotid. Left anterior and middle cerebral arteries widely patent. Posterior circulation: Both vertebral arteries patent to the basilar. PICA patent bilaterally. Basilar widely patent. Superior cerebellar and posterior cerebral arteries widely patent. Venous sinuses: Patent Anatomic variants: None Delayed phase: Normal enhancement postcontrast administration. Review of the MIP images confirms the above findings IMPRESSION: 1. Negative CT head 2. Negative for emergent large vessel occlusion 3. Mild fusiform dilatation of the internal carotid artery below the skull base with subsequent narrowing to the cavernous carotid bilaterally. Question FMD. No intracranial aneurysm. 4. These results were called by telephone at the time of interpretation on 01/22/2019 at 9:36 pm to Dr. Caryl Pina , who verbally acknowledged these results. Electronically Signed   By: Marlan Palau M.D.   On: 01/22/2019 21:36     Dg Chest 2 View Result Date: 01/23/2019 CLINICAL DATA:  Dizziness EXAM: CHEST - 2 VIEW COMPARISON:  07/29/2016 FINDINGS: The heart size and mediastinal contours are within normal limits. Both lungs are clear. The visualized skeletal structures are unremarkable. IMPRESSION: No active cardiopulmonary disease. Electronically Signed   By: Deatra Robinson M.D.   On: 01/23/2019 02:35     Mr Brain Wo Contrast Result Date: 01/23/2019 CLINICAL DATA:  Near syncopal event with slurred  speech and right upper extremity numbness. Right facial droop. Symptoms resolved after 5 minutes. EXAM: MRI HEAD WITHOUT CONTRAST TECHNIQUE: Multiplanar, multiecho pulse sequences of the brain and surrounding structures were obtained without intravenous contrast.  COMPARISON:  CTA head neck 01/22/2019 FINDINGS: BRAIN: There is multifocal acute ischemia, mostly within the left hemisphere, with punctate foci along the left precentral gyrus and within the posterior left parietal lobe. There is a single focus in the right hemispheric white matter and a small linear focus in the left occipital lobe. The midline structures are normal. No midline shift or other mass effect. Multifocal white matter hyperintensity, most commonly due to chronic ischemic microangiopathy. The cerebral and cerebellar volume are age-appropriate. No hydrocephalus. Susceptibility-sensitive sequences show no chronic microhemorrhage or superficial siderosis. No mass lesion. VASCULAR: The major intracranial arterial and venous sinus flow voids are normal. SKULL AND UPPER CERVICAL SPINE: Calvarial bone marrow signal is normal. There is no skull base mass. Visualized upper cervical spine and soft tissues are normal. SINUSES/ORBITS: No fluid levels or advanced mucosal thickening. No mastoid or middle ear effusion. The orbits are normal. IMPRESSION: 1. Multifocal acute ischemia, predominantly in the left hemisphere, although there is a single focus within the right frontal white matter. Punctate ischemic foci along the left motor strip are compatible with reported right upper extremity symptoms. 2. No hemorrhage or mass effect. 3. Mild findings of chronic ischemic microangiopathy. Electronically Signed   By: Deatra Robinson M.D.   On: 01/23/2019 02:35    12-lead ECG SR All prior EKG's in EPIC reviewed with no documented atrial fibrillation  Telemetry SR  Assessment and Plan:  1. Cryptogenic stroke The patient presents with cryptogenic stroke.  I  spoke at length with the patient about monitoring for afib with either a 30 day event monitor or an implantable loop recorder.  Risks, benefits, and alteratives to implantable loop recorder were discussed with the patient today.   At this time, the patient is very clear in her decision to proceed with implantable loop recorder.   I discussed with the patient importance of managing her DM and HTN, as well as stopping smoking, drug use.  She was receptive.  Wound care was reviewed with the patient (keep incision clean and dry for 3 days).  Wound check will be scheduled for the patient  Please call with questions.   Renee Norberto Sorenson, PA-C 01/24/2019  EP Attending  Patient seen and examined. Agree with the findings as noted above. The patient is a 38 yo woman with a cryptogenic stroke, as well as polysubstance abuse. We are asked to see regarding insertion of an ILR. I have discussed the indications for the procedure with the patient and she is willing to proceed. We will await neurologies go ahead to perform the procedure.  Leonia Reeves.D.

## 2019-01-24 NOTE — TOC Initial Note (Signed)
Transition of Care [facility]) - Initial/Assessment Note    Patient Details  Name: Alesana Bent MRN: [id number] Date of Birth: 1981/02/16  [facility] CM/SW Contact:    Kermit Balo, RN Phone Number: 01/24/2019, 11:27 AM  Clinical Narrative:                   Expected Discharge Plan: Home/Self Care Barriers to Discharge: Other (comment)(no PCP)   Patient Goals and CMS Choice Patient states their goals for this hospitalization and ongoing recovery are:: to get home      Expected Discharge Plan and Services Expected Discharge Plan: Home/Self Care   Discharge Planning Services: CM Consult, Other - See comment(PCP)   Living arrangements for the past 2 months: Single Family Home(one level) Expected Discharge Date: 01/24/19                                    Prior Living Arrangements/Services Living arrangements for the past 2 months: Single Family Home(one level) Lives with:: Siblings, Significant Other Patient language and need for interpreter reviewed:: Yes(no needs) Do you feel safe going back to the place where you live?: Yes      Need for Family Participation in Patient Care: Yes (Comment)(intermittent supervision) Care giver support system in place?: Yes (comment)(sister and boyfriend can provide supervision)   Criminal Activity/Legal Involvement Pertinent to Current Situation/Hospitalization: No - Comment as needed  Activities of Daily Living Home Assistive Devices/Equipment: None ADL Screening (condition at time of admission) Patient's cognitive ability adequate to safely complete daily activities?: Yes Is the patient deaf or have difficulty hearing?: No Does the patient have difficulty seeing, even when wearing glasses/contacts?: No Does the patient have difficulty concentrating, remembering, or making decisions?: No Patient able to express need for assistance with ADLs?: Yes Does the patient have difficulty dressing or bathing?:  No Independently performs ADLs?: Yes (appropriate for developmental age) Does the patient have difficulty walking or climbing stairs?: No Weakness of Legs: None Weakness of Arms/Hands: Right(numbness and tingling)  Permission Sought/Granted                  Emotional Assessment Appearance:: Appears stated age Attitude/Demeanor/Rapport: Engaged Affect (typically observed): Accepting, Appropriate, Pleasant Orientation: : Oriented to Self, Oriented to Place, Oriented to  Time, Oriented to Situation   Psych Involvement: No (comment)  Admission diagnosis:  TIA (transient ischemic attack) [G45.9] Patient Active Problem List   Diagnosis Date Noted  . TIA (transient ischemic attack) 01/22/2019  . Diabetes mellitus due to underlying condition without complications (HCC) 05/30/2014  . Essential hypertension, benign 05/30/2014  . Smoking 05/30/2014   PCP:  Patient, No Pcp Per Pharmacy:   [facility] [address] ([address] [place] - [address] [place] ([place]) [place] [address] Phone: [phone] Fax: [phone]     Social Determinants of Health (SDOH) Interventions    Readmission Risk Interventions No flowsheet data found.

## 2019-01-25 ENCOUNTER — Telehealth: Payer: Self-pay | Admitting: Neurology

## 2019-01-25 LAB — PROTEIN C ACTIVITY: Protein C Activity: 153 % (ref 73–180)

## 2019-01-25 LAB — ANTI-DNA ANTIBODY, DOUBLE-STRANDED: ds DNA Ab: 1 IU/mL (ref 0–9)

## 2019-01-25 LAB — PROTEIN S ACTIVITY: Protein S Activity: 55 % — ABNORMAL LOW (ref 63–140)

## 2019-01-25 LAB — SICKLE CELL SCREEN: Sickle Cell Screen: NEGATIVE

## 2019-01-25 LAB — BETA 2 MICROGLOBULIN, SERUM: Beta-2 Microglobulin: 1.2 mg/L (ref 0.6–2.4)

## 2019-01-25 LAB — ANA W/REFLEX IF POSITIVE: Anti Nuclear Antibody (ANA): NEGATIVE

## 2019-01-25 LAB — HOMOCYSTEINE: Homocysteine: 12.3 umol/L (ref 0.0–14.5)

## 2019-01-25 LAB — PROTEIN S, TOTAL: Protein S Ag, Total: 105 % (ref 60–150)

## 2019-01-25 NOTE — Telephone Encounter (Signed)
Message sent to new pt records for scheduling.

## 2019-01-25 NOTE — Telephone Encounter (Addendum)
This pt states she saw Dr Pearlean Brownie in the hospital on yesterday and failed to get a note from him re: returning to work. Pt states she called the hospital and was given [facility] # to call for note.  Pt was advised that Dr Pearlean Brownie is not schedule to return here until 05-04.  Pt has asked that a message be sent to Dr Marlis Edelson RN requesting a note because she is unable to return to work without a note

## 2019-01-26 LAB — ANTIPHOSPHOLIPID SYNDROME EVAL, BLD
Anticardiolipin IgA: <9 APL U/mL (ref 0–11)
Anticardiolipin IgG: <9 GPL U/mL (ref 0–14)
Anticardiolipin IgM: 12 MPL U/mL (ref 0–12)
DRVVT: 45.5 s (ref 0.0–47.0)
PTT Lupus Anticoagulant: 38.8 s (ref 0.0–51.9)
Phosphatydalserine, IgA: 2 APS IgA (ref 0–20)
Phosphatydalserine, IgG: 3 GPS IgG (ref 0–11)
Phosphatydalserine, IgM: >100 MPS IgM — ABNORMAL HIGH (ref 0–25)

## 2019-01-26 LAB — PROTEIN C, TOTAL: Protein C, Total: 122 % (ref 60–150)

## 2019-01-27 LAB — PROTHROMBIN GENE MUTATION

## 2019-01-28 LAB — FACTOR 5 LEIDEN

## 2019-01-29 ENCOUNTER — Telehealth: Payer: Self-pay | Admitting: Neurology

## 2019-01-29 NOTE — Telephone Encounter (Signed)
Ebony Matthews, This should be a new referral from the hospital. can you make sure patient has a follow up with Dr. Pearlean Brownie please in 6 weeks to follow up for Stroke? This should be seen by Dr. Pearlean Brownie not [name] and I would like Dr. Pearlean Brownie to review the hypercoag labs personally.   When you call patient would you stress that she should come to the office to review labs taken at the hospital and also continue to follow management set by the hospital on her discharge until then? It is her responsibility to make sure she follows up with [place] to review all the lab findings and so we cn review next steps. thanks

## 2019-02-01 DIAGNOSIS — F17210 Nicotine dependence, cigarettes, uncomplicated: Secondary | ICD-10-CM | POA: Diagnosis not present

## 2019-02-01 DIAGNOSIS — I10 Essential (primary) hypertension: Secondary | ICD-10-CM | POA: Diagnosis not present

## 2019-02-01 DIAGNOSIS — E1169 Type 2 diabetes mellitus with other specified complication: Secondary | ICD-10-CM | POA: Diagnosis not present

## 2019-02-07 ENCOUNTER — Ambulatory Visit: Payer: Self-pay

## 2019-02-28 ENCOUNTER — Telehealth: Payer: Self-pay | Admitting: Neurology

## 2019-02-28 NOTE — Telephone Encounter (Signed)
Pt gave consent for VV on the phone/ Pt understands that although there may be some limitations with this type of visit, we will take all precautions to reduce any security or privacy concerns.  Pt understands that this will be treated like an in office visit and we will file with pt's insurance, and there may be a patient responsible charge related to this service. Sent email w link to   [email or web address]

## 2019-03-01 ENCOUNTER — Other Ambulatory Visit: Payer: Self-pay

## 2019-03-01 ENCOUNTER — Inpatient Hospital Stay (INDEPENDENT_AMBULATORY_CARE_PROVIDER_SITE_OTHER): Payer: Self-pay | Admitting: Neurology

## 2019-03-01 DIAGNOSIS — Z0289 Encounter for other administrative examinations: Secondary | ICD-10-CM

## 2019-03-01 NOTE — Telephone Encounter (Signed)
PT was sent link to email for video visit today. PT was resent link to her cell phone by Dr. Pearlean Brownie to join him for a video visit. Rn also left vm that her appt will be cancel due to not responding to telephone or link sent to her to join video. I stated she will have to reschedule with Dr. Pearlean Brownie. Also her appt is a no show appt.

## 2020-03-03 ENCOUNTER — Inpatient Hospital Stay (HOSPITAL_COMMUNITY)
Admission: EM | Admit: 2020-03-03 | Discharge: 2020-03-04 | DRG: 065 | Disposition: A | Discharge status: Home / Self Care | Payer: Commercial Managed Care - PPO | Attending: Internal Medicine | Admitting: Internal Medicine

## 2020-03-03 ENCOUNTER — Encounter (HOSPITAL_COMMUNITY): Payer: Self-pay

## 2020-03-03 ENCOUNTER — Observation Stay (HOSPITAL_COMMUNITY): Payer: Commercial Managed Care - PPO

## 2020-03-03 ENCOUNTER — Other Ambulatory Visit: Payer: Self-pay

## 2020-03-03 ENCOUNTER — Emergency Department (HOSPITAL_COMMUNITY): Payer: Commercial Managed Care - PPO

## 2020-03-03 DIAGNOSIS — I160 Hypertensive urgency: Secondary | ICD-10-CM | POA: Diagnosis present

## 2020-03-03 DIAGNOSIS — Z7984 Long term (current) use of oral hypoglycemic drugs: Secondary | ICD-10-CM

## 2020-03-03 DIAGNOSIS — Z20822 Contact with and (suspected) exposure to covid-19: Secondary | ICD-10-CM | POA: Diagnosis present

## 2020-03-03 DIAGNOSIS — I10 Essential (primary) hypertension: Secondary | ICD-10-CM | POA: Diagnosis present

## 2020-03-03 DIAGNOSIS — R531 Weakness: Secondary | ICD-10-CM | POA: Insufficient documentation

## 2020-03-03 DIAGNOSIS — F17210 Nicotine dependence, cigarettes, uncomplicated: Secondary | ICD-10-CM | POA: Diagnosis present

## 2020-03-03 DIAGNOSIS — Z8673 Personal history of transient ischemic attack (TIA), and cerebral infarction without residual deficits: Secondary | ICD-10-CM

## 2020-03-03 DIAGNOSIS — Z9119 Patient's noncompliance with other medical treatment and regimen: Secondary | ICD-10-CM

## 2020-03-03 DIAGNOSIS — Z7982 Long term (current) use of aspirin: Secondary | ICD-10-CM

## 2020-03-03 DIAGNOSIS — E089 Diabetes mellitus due to underlying condition without complications: Secondary | ICD-10-CM

## 2020-03-03 DIAGNOSIS — G8194 Hemiplegia, unspecified affecting left nondominant side: Secondary | ICD-10-CM | POA: Diagnosis present

## 2020-03-03 DIAGNOSIS — F1410 Cocaine abuse, uncomplicated: Secondary | ICD-10-CM | POA: Diagnosis present

## 2020-03-03 DIAGNOSIS — E1165 Type 2 diabetes mellitus with hyperglycemia: Secondary | ICD-10-CM | POA: Diagnosis present

## 2020-03-03 DIAGNOSIS — Z79899 Other long term (current) drug therapy: Secondary | ICD-10-CM

## 2020-03-03 DIAGNOSIS — I639 Cerebral infarction, unspecified: Secondary | ICD-10-CM | POA: Diagnosis not present

## 2020-03-03 DIAGNOSIS — R29702 NIHSS score 2: Secondary | ICD-10-CM | POA: Diagnosis present

## 2020-03-03 DIAGNOSIS — E785 Hyperlipidemia, unspecified: Secondary | ICD-10-CM | POA: Diagnosis present

## 2020-03-03 DIAGNOSIS — Z91148 Patient's other noncompliance with medication regimen for other reason: Secondary | ICD-10-CM

## 2020-03-03 DIAGNOSIS — Z9114 Patient's other noncompliance with medication regimen: Secondary | ICD-10-CM | POA: Diagnosis not present

## 2020-03-03 LAB — I-STAT BETA HCG BLOOD, ED (MC, WL, AP ONLY): I-stat hCG, quantitative: <5 m[IU]/mL (ref <5)

## 2020-03-03 LAB — URINALYSIS, ROUTINE W REFLEX MICROSCOPIC
Bacteria, UA: NONE SEEN
Bilirubin Urine: NEGATIVE
Glucose, UA: >=500 mg/dL — AB
Hgb urine dipstick: NEGATIVE
Ketones, ur: 20 mg/dL — AB
Leukocytes,Ua: NEGATIVE
Nitrite: NEGATIVE
Protein, ur: 100 mg/dL — AB
Specific Gravity, Urine: 1.031 — ABNORMAL HIGH (ref 1.005–1.030)
pH: 5 (ref 5.0–8.0)

## 2020-03-03 LAB — DIFFERENTIAL
Abs Immature Granulocytes: 0 10*3/uL (ref 0.00–0.07)
Basophils Absolute: 0.1 10*3/uL (ref 0.0–0.1)
Basophils Relative: 1 %
Eosinophils Absolute: 0 10*3/uL (ref 0.0–0.5)
Eosinophils Relative: 0 %
Lymphocytes Relative: 39 %
Lymphs Abs: 4.1 10*3/uL — ABNORMAL HIGH (ref 0.7–4.0)
Monocytes Absolute: 0.4 10*3/uL (ref 0.1–1.0)
Monocytes Relative: 4 %
Neutro Abs: 5.9 10*3/uL (ref 1.7–7.7)
Neutrophils Relative %: 56 %
nRBC: 0 /100 WBC

## 2020-03-03 LAB — CBC
HCT: 46.9 % — ABNORMAL HIGH (ref 36.0–46.0)
Hemoglobin: 14.9 g/dL (ref 12.0–15.0)
MCH: 28.1 pg (ref 26.0–34.0)
MCHC: 31.8 g/dL (ref 30.0–36.0)
MCV: 88.5 fL (ref 80.0–100.0)
Platelets: 198 10*3/uL (ref 150–400)
RBC: 5.3 MIL/uL — ABNORMAL HIGH (ref 3.87–5.11)
RDW: 16 % — ABNORMAL HIGH (ref 11.5–15.5)
WBC: 10.5 10*3/uL (ref 4.0–10.5)
nRBC: 0 % (ref 0.0–0.2)

## 2020-03-03 LAB — I-STAT CHEM 8, ED
BUN: 11 mg/dL (ref 6–20)
Calcium, Ion: 1.13 mmol/L — ABNORMAL LOW (ref 1.15–1.40)
Chloride: 100 mmol/L (ref 98–111)
Creatinine, Ser: 0.7 mg/dL (ref 0.44–1.00)
Glucose, Bld: 319 mg/dL — ABNORMAL HIGH (ref 70–99)
HCT: 44 % (ref 36.0–46.0)
Hemoglobin: 15 g/dL (ref 12.0–15.0)
Potassium: 4.3 mmol/L (ref 3.5–5.1)
Sodium: 135 mmol/L (ref 135–145)
TCO2: 23 mmol/L (ref 22–32)

## 2020-03-03 LAB — SARS CORONAVIRUS 2 BY RT PCR (HOSPITAL ORDER, PERFORMED IN ~~LOC~~ HOSPITAL LAB): SARS Coronavirus 2: NEGATIVE

## 2020-03-03 LAB — RAPID URINE DRUG SCREEN, HOSP PERFORMED
Amphetamines: NOT DETECTED
Barbiturates: NOT DETECTED
Benzodiazepines: NOT DETECTED
Cocaine: POSITIVE — AB
Opiates: NOT DETECTED
Tetrahydrocannabinol: POSITIVE — AB

## 2020-03-03 LAB — COMPREHENSIVE METABOLIC PANEL
ALT: 17 U/L (ref 0–44)
AST: 22 U/L (ref 15–41)
Albumin: 3.9 g/dL (ref 3.5–5.0)
Alkaline Phosphatase: 94 U/L (ref 38–126)
Anion gap: 15 (ref 5–15)
BUN: 8 mg/dL (ref 6–20)
CO2: 20 mmol/L — ABNORMAL LOW (ref 22–32)
Calcium: 9.3 mg/dL (ref 8.9–10.3)
Chloride: 99 mmol/L (ref 98–111)
Creatinine, Ser: 0.85 mg/dL (ref 0.44–1.00)
GFR calc Af Amer: >60 mL/min (ref >60)
GFR calc non Af Amer: >60 mL/min (ref >60)
Glucose, Bld: 316 mg/dL — ABNORMAL HIGH (ref 70–99)
Potassium: 4.6 mmol/L (ref 3.5–5.1)
Sodium: 134 mmol/L — ABNORMAL LOW (ref 135–145)
Total Bilirubin: 0.8 mg/dL (ref 0.3–1.2)
Total Protein: 7.6 g/dL (ref 6.5–8.1)

## 2020-03-03 LAB — HIV ANTIBODY (ROUTINE TESTING W REFLEX): HIV Screen 4th Generation wRfx: NONREACTIVE

## 2020-03-03 LAB — CBG MONITORING, ED: Glucose-Capillary: 229 mg/dL — ABNORMAL HIGH (ref 70–99)

## 2020-03-03 LAB — HEMOGLOBIN A1C
Hgb A1c MFr Bld: 10.5 % — ABNORMAL HIGH (ref 4.8–5.6)
Mean Plasma Glucose: 254.65 mg/dL

## 2020-03-03 LAB — GLUCOSE, CAPILLARY: Glucose-Capillary: 172 mg/dL — ABNORMAL HIGH (ref 70–99)

## 2020-03-03 LAB — TROPONIN I (HIGH SENSITIVITY): Troponin I (High Sensitivity): 9 ng/L (ref <18)

## 2020-03-03 LAB — PROTIME-INR
INR: 1 (ref 0.8–1.2)
Prothrombin Time: 12.6 seconds (ref 11.4–15.2)

## 2020-03-03 LAB — ETHANOL: Alcohol, Ethyl (B): <10 mg/dL (ref <10)

## 2020-03-03 LAB — APTT: aPTT: 32 seconds (ref 24–36)

## 2020-03-03 MED ORDER — STROKE: EARLY STAGES OF RECOVERY BOOK
Freq: Once | Status: AC
  Filled 2020-03-03: qty 1

## 2020-03-03 MED ORDER — ATORVASTATIN CALCIUM 40 MG PO TABS
40.0000 mg | ORAL_TABLET | Freq: Every day | ORAL | Status: DC
  Administered 2020-03-03: 40 mg via ORAL
  Filled 2020-03-03 (×2): qty 1

## 2020-03-03 MED ORDER — ENOXAPARIN SODIUM 40 MG/0.4ML ~~LOC~~ SOLN
40.0000 mg | SUBCUTANEOUS | Status: DC
  Administered 2020-03-03: 40 mg via SUBCUTANEOUS
  Filled 2020-03-03: qty 0.4

## 2020-03-03 MED ORDER — INSULIN ASPART 100 UNIT/ML ~~LOC~~ SOLN: 0.0000 [IU] | Freq: Every day | SUBCUTANEOUS | Status: DC

## 2020-03-03 MED ORDER — CARVEDILOL 3.125 MG PO TABS
3.1250 mg | ORAL_TABLET | Freq: Two times a day (BID) | ORAL | Status: DC
  Administered 2020-03-03: 3.125 mg via ORAL
  Filled 2020-03-03 (×3): qty 1

## 2020-03-03 MED ORDER — INSULIN GLARGINE 100 UNIT/ML ~~LOC~~ SOLN
10.0000 [IU] | Freq: Every day | SUBCUTANEOUS | Status: DC
  Administered 2020-03-04: 10 [IU] via SUBCUTANEOUS
  Filled 2020-03-03 (×2): qty 0.1

## 2020-03-03 MED ORDER — SENNOSIDES-DOCUSATE SODIUM 8.6-50 MG PO TABS: 1.0000 | ORAL_TABLET | Freq: Every evening | ORAL | Status: DC | PRN

## 2020-03-03 MED ORDER — HYDRALAZINE HCL 25 MG PO TABS
25.0000 mg | ORAL_TABLET | Freq: Four times a day (QID) | ORAL | Status: DC | PRN
  Filled 2020-03-03: qty 1

## 2020-03-03 MED ORDER — INSULIN ASPART 100 UNIT/ML ~~LOC~~ SOLN
0.0000 [IU] | Freq: Three times a day (TID) | SUBCUTANEOUS | Status: DC
  Administered 2020-03-03: 5 [IU] via SUBCUTANEOUS
  Administered 2020-03-04 (×2): 3 [IU] via SUBCUTANEOUS

## 2020-03-03 MED ORDER — ACETAMINOPHEN 650 MG RE SUPP: 650.0000 mg | RECTAL | Status: DC | PRN

## 2020-03-03 MED ORDER — LISINOPRIL 10 MG PO TABS
10.0000 mg | ORAL_TABLET | Freq: Every day | ORAL | Status: DC
  Administered 2020-03-03 – 2020-03-04 (×2): 10 mg via ORAL
  Filled 2020-03-03 (×2): qty 1

## 2020-03-03 MED ORDER — IOHEXOL 350 MG/ML SOLN
75.0000 mL | Freq: Once | INTRAVENOUS | Status: AC | PRN
  Administered 2020-03-03: 75 mL via INTRAVENOUS

## 2020-03-03 MED ORDER — LORAZEPAM 0.5 MG PO TABS: 0.5000 mg | ORAL_TABLET | Freq: Four times a day (QID) | ORAL | Status: DC | PRN

## 2020-03-03 MED ORDER — ACETAMINOPHEN 325 MG PO TABS
650.0000 mg | ORAL_TABLET | ORAL | Status: DC | PRN
  Administered 2020-03-03: 650 mg via ORAL
  Filled 2020-03-03: qty 2

## 2020-03-03 MED ORDER — ACETAMINOPHEN 160 MG/5ML PO SOLN: 650.0000 mg | ORAL | Status: DC | PRN

## 2020-03-03 MED ORDER — ASPIRIN 325 MG PO TABS
325.0000 mg | ORAL_TABLET | Freq: Every day | ORAL | Status: DC
  Administered 2020-03-03 – 2020-03-04 (×2): 325 mg via ORAL
  Filled 2020-03-03 (×2): qty 1

## 2020-03-03 NOTE — ED Triage Notes (Signed)
Patient complains of left forearm numbness intermittently x 1 week. Denies trauma. No other complaints.  Reports that her left cheek felt numb yesterday-reports that lasted a minute and has none since.

## 2020-03-03 NOTE — Consult Note (Addendum)
NEURO HOSPITALIST  CONSULT   Requesting Physician: Dr. Roosevelt Locks    Chief Complaint: Left arm weakness, numbness  History obtained from:  Patient    HPI:                                                                                                                                         Ebony Matthews is a 39 y.o. female with a PMHx of HTN, TIA and DM, who presented to the [facility] ED with a chief complaint of left arm numbness and tingling for about 3 days.  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 She noticed some numbness, but mostly problems gripping. It has been constant since Thursday and she has been dropping things. It has not gotten worse or any better. She decided to come to the ED today because it has not improved and she did not think she could work this morning. Denies any vision changes, falls, trouble walking, weakness or numbness or tingling in the legs. Endorses smoking cigarettes < 1 pack per day, ETOH use occasionally, smoking cocaine and marijuana occassionally. D/t her current living situation she has not been taking her DM or BP medications.  ED course:  CTA:no hemorrhage  BG: 319  BP: 191/107 UDS: +cocaine, +THC MRI: pending  Modified Rankin: Rankin Score=0  NIHSS: 2 (drift, dysmetria)  Past Medical History:  Diagnosis Date  . Diabetes mellitus without complication ([place])   . Hypertension     Past Surgical History:  Procedure Laterality Date  . NO PAST SURGERIES      Family History  Problem Relation Age of Onset  . Diabetes Other   . Hypertension Maternal Grandmother        Social History:  reports that she has been smoking. She has a 15.00 pack-year smoking history. She has never used smokeless tobacco. She reports current drug use. Drug: Marijuana. She reports that she does not drink alcohol.  Allergies: No  Known Allergies  Medications:  Current Facility-Administered Medications  Medication Dose Route Frequency Provider Last Rate Last Admin  .  stroke: mapping our early stages of recovery book   Does not apply Once Mikey College T, MD      . acetaminophen (TYLENOL) tablet 650 mg  650 mg Oral Q4H PRN Emeline General, MD       Or  . acetaminophen (TYLENOL) 160 MG/5ML solution 650 mg  650 mg Per Tube Q4H PRN Emeline General, MD       Or  . acetaminophen (TYLENOL) suppository 650 mg  650 mg Rectal Q4H PRN Mikey College T, MD      . aspirin tablet 325 mg  325 mg Oral Daily Mikey College T, MD      . atorvastatin (LIPITOR) tablet 40 mg  40 mg Oral q1800 Mikey College T, MD      . carvedilol (COREG) tablet 3.125 mg  3.125 mg Oral BID WC Mikey College T, MD      . enoxaparin (LOVENOX) injection 40 mg  40 mg Subcutaneous Q24H Mikey College T, MD      . hydrALAZINE (APRESOLINE) tablet 25 mg  25 mg Oral Q6H PRN Mikey College T, MD      . insulin aspart (novoLOG) injection 0-15 Units  0-15 Units Subcutaneous TID WC Mikey College T, MD      . insulin aspart (novoLOG) injection 0-5 Units  0-5 Units Subcutaneous QHS Mikey College T, MD      . insulin glargine (LANTUS) injection 10 Units  10 Units Subcutaneous Daily Mikey College T, MD      . lisinopril (ZESTRIL) tablet 10 mg  10 mg Oral Daily Mikey College T, MD      . LORazepam (ATIVAN) tablet 0.5 mg  0.5 mg Oral Q6H PRN Emeline General, MD      . senna-docusate (Senokot-S) tablet 1 tablet  1 tablet Oral QHS PRN Emeline General, MD       Current Outpatient Medications  Medication Sig Dispense Refill  . naproxen sodium (ALEVE) 220 MG tablet Take 220 mg by mouth 2 (two) times daily as needed (headache).    Marland Kitchen aspirin 325 MG tablet Take 1 tablet (325 mg total) by mouth daily. (Patient not taking: Reported on 03/03/2020) 30 tablet 0  . atorvastatin (LIPITOR) 40 MG tablet  Take 1 tablet (40 mg total) by mouth daily at 6 PM. (Patient not taking: Reported on 03/03/2020) 30 tablet 0  . lisinopril (ZESTRIL) 10 MG tablet Take 1 tablet (10 mg total) by mouth daily for 30 days. 30 tablet 0  . metFORMIN (GLUCOPHAGE) 1000 MG tablet Take 1 tablet (1,000 mg total) by mouth 2 (two) times daily. (Patient not taking: Reported on 03/03/2020) 60 tablet 0    ROS:  ROS was performed and is negative except as noted in HPI  General Examination:                                                                                                      Blood pressure (!) 191/107, pulse (!) 105, temperature 99.1 F (37.3 C), temperature source Oral, resp. rate 20, SpO2 98 %.  Physical Exam  Constitutional: Appears well-developed and well-nourished.  Psych: Affect appropriate to situation Eyes: Normal external eye and conjunctiva. HENT: Normocephalic, no lesions, without obvious abnormality.   Musculoskeletal-no joint tenderness, deformity or swelling Cardiovascular: Normal rate and regular rhythm.  Respiratory: Effort normal, non-labored breathing saturations WNL GI: Soft.  No distension. There is no tenderness.  Skin: WDI  Neurological Examination Mental Status: Alert, oriented, thought content appropriate.  Speech fluent without evidence of aphasia.  Able to follow  commands without difficulty. Naming and repetition intact Cranial Nerves: II:  Visual fields grossly normal, PERRL III,IV, VI: ptosis not present, extra-ocular motions intact bilaterally V,VII: smile symmetric, facial light touch sensation normal bilaterally VIII: hearing intact to voice IX,X: uvula midline XI: Symmetric XII: Midline tongue extension Motor: Right : Upper extremity   5/5 Left:     Upper extremity   4+/5  Triceps 5/5  Triceps 4-/5 Lower extremity   5/5  Lower extremity    5/5 Tone and bulk:normal tone throughout; no atrophy noted Sensory: Cool temp and light touch intact throughout, bilaterally Deep Tendon Reflexes: 2+ and symmetric biceps and patellae Cerebellar: Normal HTS. Slight dysmetria with FNF on the left Gait: Deferred   Lab Results: Basic Metabolic Panel: Recent Labs  Lab 03/03/20 1232 03/03/20 1238  NA 134* 135  K 4.6 4.3  CL 99 100  CO2 20*  --   GLUCOSE 316* 319*  BUN 8 11  CREATININE 0.85 0.70  CALCIUM 9.3  --     CBC: Recent Labs  Lab 03/03/20 1232 03/03/20 1238  WBC 10.5  --   NEUTROABS 5.9  --   HGB 14.9 15.0  HCT 46.9* 44.0  MCV 88.5  --   PLT 198  --      Imaging: CT Angio Head W or Wo Contrast  Result Date: 03/03/2020 CLINICAL DATA:  Left forearm numbness intermittently over the last week. History of numerous small infarctions in April of 2020. EXAM: CT ANGIOGRAPHY HEAD AND NECK TECHNIQUE: Multidetector CT imaging of the head and neck was performed using the standard protocol during bolus administration of intravenous contrast. Multiplanar CT image reconstructions and MIPs were obtained to evaluate the vascular anatomy. Carotid stenosis measurements (when applicable) are obtained utilizing NASCET criteria, using the distal internal carotid diameter as the denominator. CONTRAST:  2mL OMNIPAQUE IOHEXOL 350 MG/ML SOLN COMPARISON:  MRI and CT angiography 01/22/2019 FINDINGS: CT HEAD FINDINGS Brain: The brain shows a normal appearance without evidence of malformation, atrophy, old or acute small or large vessel infarction, mass lesion, hemorrhage, hydrocephalus or extra-axial collection. Vascular: No hyperdense vessel. No evidence of atherosclerotic calcification. Skull: Normal.  No traumatic finding.  No focal bone lesion. Sinuses/Orbits: Sinuses  are clear. Orbits appear normal. Mastoids are clear. Other: None significant CTA NECK FINDINGS Aortic arch: Aortic arch appears normal. Branching pattern is normal without origin  stenosis. Right carotid system: Common carotid artery widely patent to the bifurcation. Carotid bifurcation is normal. Cervical internal carotid artery appears slightly narrow in general, with mild dilatation just beneath the skull base. As question previously, this could possibly represent fibromuscular dysplasia. Left carotid system: Common carotid artery widely patent to the bifurcation. Carotid bifurcation is normal. Also on this side, the cervical internal carotid artery is somewhat narrow in general with mild dilatation just inferior to the skull base. Question fibromuscular dysplasia. Vertebral arteries: Both vertebral arteries are widely patent at their origins and through the cervical region to the foramen magnum. Skeleton: Normal Other neck: No mass or lymphadenopathy. Upper chest: Normal Review of the MIP images confirms the above findings CTA HEAD FINDINGS Anterior circulation: Both internal carotid arteries are patent through the skull base and siphon regions. Carotid siphon vessels appear narrow, right more than left, without focal stenosis. Supraclinoid internal carotid arteries regains a more normal caliber. The anterior and middle cerebral vessels are patent without large or medium vessel occlusion. Posterior circulation: Both vertebral arteries are widely patent to the basilar. No basilar stenosis. Posterior circulation branch vessels are normal. Venous sinuses: Patent and normal. Anatomic variants: None significant. Review of the MIP images confirms the above findings IMPRESSION: No appreciable change since the study of last year. No intracranial large or medium vessel occlusion or correctable proximal stenosis. Both cervical internal carotid arteries are somewhat narrow and tortuous, with mild dilatation just inferior to the skull base. This could be due to fibromuscular disease or atherosclerotic disease. Both internal carotid arteries are small and slightly irregular in the carotid siphon  regions, most consistent with atherosclerotic disease in this patient with a history of diabetes and hypertension. Electronically Signed   By: Paulina Fusi M.D.   On: 03/03/2020 14:08   CT Angio Neck W and/or Wo Contrast  Result Date: 03/03/2020 CLINICAL DATA:  Left forearm numbness intermittently over the last week. History of numerous small infarctions in April of 2020. EXAM: CT ANGIOGRAPHY HEAD AND NECK TECHNIQUE: Multidetector CT imaging of the head and neck was performed using the standard protocol during bolus administration of intravenous contrast. Multiplanar CT image reconstructions and MIPs were obtained to evaluate the vascular anatomy. Carotid stenosis measurements (when applicable) are obtained utilizing NASCET criteria, using the distal internal carotid diameter as the denominator. CONTRAST:  70mL OMNIPAQUE IOHEXOL 350 MG/ML SOLN COMPARISON:  MRI and CT angiography 01/22/2019 FINDINGS: CT HEAD FINDINGS Brain: The brain shows a normal appearance without evidence of malformation, atrophy, old or acute small or large vessel infarction, mass lesion, hemorrhage, hydrocephalus or extra-axial collection. Vascular: No hyperdense vessel. No evidence of atherosclerotic calcification. Skull: Normal.  No traumatic finding.  No focal bone lesion. Sinuses/Orbits: Sinuses are clear. Orbits appear normal. Mastoids are clear. Other: None significant CTA NECK FINDINGS Aortic arch: Aortic arch appears normal. Branching pattern is normal without origin stenosis. Right carotid system: Common carotid artery widely patent to the bifurcation. Carotid bifurcation is normal. Cervical internal carotid artery appears slightly narrow in general, with mild dilatation just beneath the skull base. As question previously, this could possibly represent fibromuscular dysplasia. Left carotid system: Common carotid artery widely patent to the bifurcation. Carotid bifurcation is normal. Also on this side, the cervical internal carotid  artery is somewhat narrow in general with mild dilatation just inferior to the  skull base. Question fibromuscular dysplasia. Vertebral arteries: Both vertebral arteries are widely patent at their origins and through the cervical region to the foramen magnum. Skeleton: Normal Other neck: No mass or lymphadenopathy. Upper chest: Normal Review of the MIP images confirms the above findings CTA HEAD FINDINGS Anterior circulation: Both internal carotid arteries are patent through the skull base and siphon regions. Carotid siphon vessels appear narrow, right more than left, without focal stenosis. Supraclinoid internal carotid arteries regains a more normal caliber. The anterior and middle cerebral vessels are patent without large or medium vessel occlusion. Posterior circulation: Both vertebral arteries are widely patent to the basilar. No basilar stenosis. Posterior circulation branch vessels are normal. Venous sinuses: Patent and normal. Anatomic variants: None significant. Review of the MIP images confirms the above findings IMPRESSION: No appreciable change since the study of last year. No intracranial large or medium vessel occlusion or correctable proximal stenosis. Both cervical internal carotid arteries are somewhat narrow and tortuous, with mild dilatation just inferior to the skull base. This could be due to fibromuscular disease or atherosclerotic disease. Both internal carotid arteries are small and slightly irregular in the carotid siphon regions, most consistent with atherosclerotic disease in this patient with a history of diabetes and hypertension. Electronically Signed   By: Paulina FusiMark  Shogry M.D.   On: 03/03/2020 14:08    Valentina LucksJessica Williams, MSN, NP-C [facility] [phone] 03/03/2020, 3:30 PM   Assessment: 39 y.o. female with a PMHx of HTN, TIA and DM, who [facility] with a chief complaint of left arm numbness and tingling for about 3 days. 1.CTA was negative for LVO.  Atherosclerotic changes involving the carotid siphons is noted, in addition to irregularity of the cervical ICAs, suggestive of either fibromuscular dysplasia or atherosclerotic disease.   2. Exam with mild dysmetria of LUE as well as LUE drift. NIHSS: 2.  3. MRI is pending 4. Stroke Risk Factors - diabetes mellitus and hypertension  Recommendations: -- BP goal: Permissive HTN up to 220/110 mmHg (for 24-48 post admission) then gradually normalize to goal of normotension -- MRI Brain  -- Echocardiogram -- Prophylactic therapy-ASA -- Atorvastatin 40 mg daily -- HgbA1c, fasting lipid panel -- PT consult, OT consult, Speech consult -- Telemetry monitoring -- Frequent neuro checks -- Stroke swallow screen  -- Smoking cessation is recommended -- Cessation of cocaine use is recommended -- Please page the Stroke team from 8am-4pm. You can look them up on [email or web address]  I have seen and examined the patient. I have formulated the assessment and recommendations. 39 year old female who [facility] with a chief complaint of left arm numbness and tingling for about 3 days. CTA shows evidence for atherosclerotic disease. Recommendations as above.  Electronically signed: Dr. Caryl PinaEric Tayten Heber

## 2020-03-03 NOTE — Progress Notes (Addendum)
Pt returned to the floor. Pt explained that she needed to go to the front desk to drop something off in order for her boyfriend to pick up. Explained to pt that she needs to stay on the unit or have staff accompany her. Pt back in her room and verbalizes understanding of the need to stay in her room. Pt in bed with bed alarm on and tele reconnected.

## 2020-03-03 NOTE — H&P (Signed)
History and Physical    Ebony Matthews FBP:[id number] DOB: 12-27-1980 DOA: 03/03/2020  PCP: Georgiann Cocker, NP (Confirm with patient/family/NH records and if not entered, this has to be entered at [facility] point of entry) Patient coming from: Home  I have personally briefly reviewed patient's old medical records in [facility]  Chief Complaint: Left arm numb and weak for 2 days  HPI: Ebony Matthews is a 39 y.o. female with medical history significant of Stroke in 2020, HTN, IIDM, HLD, on going cocaine abuse, non-compliant with medications, presented with new onset of left arm weakness and numbness for 2 days.  She was supposed to take ASA, statin and metformin but she claimed that she moved and lost follow-ups and has not taken any of her meds "for quite a few months". She admitted that she still abusing cocaine almost every week. She denied any chest pain, no slurred speech or vision or hearing changes. ED Course: CTA no critical narrowing. UDS positive for cocaine. GLU >300  Review of Systems: As per HPI otherwise 10 point review of systems negative.    Past Medical History:  Diagnosis Date  . Diabetes mellitus without complication (HCC)   . Hypertension     Past Surgical History:  Procedure Laterality Date  . NO PAST SURGERIES       reports that she has been smoking. She has a 15.00 pack-year smoking history. She has never used smokeless tobacco. She reports current drug use. Drug: Marijuana. She reports that she does not drink alcohol.  No Known Allergies  Family History  Problem Relation Age of Onset  . Diabetes Other   . Hypertension Maternal Grandmother      Prior to Admission medications   Medication Sig Start Date End Date Taking? Authorizing Provider  naproxen sodium (ALEVE) 220 MG tablet Take 220 mg by mouth 2 (two) times daily as needed (headache).   Yes [provider]  aspirin 325 MG tablet Take 1 tablet (325 mg total) by mouth daily. Patient not  taking: Reported on 03/03/2020 01/24/19   Glade Lloyd, MD  atorvastatin (LIPITOR) 40 MG tablet Take 1 tablet (40 mg total) by mouth daily at 6 PM. Patient not taking: Reported on 03/03/2020 01/24/19   Glade Lloyd, MD  lisinopril (ZESTRIL) 10 MG tablet Take 1 tablet (10 mg total) by mouth daily for 30 days. 01/27/19 02/26/19  Glade Lloyd, MD  metFORMIN (GLUCOPHAGE) 1000 MG tablet Take 1 tablet (1,000 mg total) by mouth 2 (two) times daily. Patient not taking: Reported on 03/03/2020 01/24/19   Glade Lloyd, MD    Physical Exam: Vitals:   03/03/20 1014  BP: (!) 191/107  Pulse: (!) 105  Resp: 20  Temp: 99.1 F (37.3 C)  TempSrc: Oral  SpO2: 98%    Constitutional: NAD, calm, comfortable Vitals:   03/03/20 1014  BP: (!) 191/107  Pulse: (!) 105  Resp: 20  Temp: 99.1 F (37.3 C)  TempSrc: Oral  SpO2: 98%   Eyes: PERRL, lids and conjunctivae normal ENMT: Mucous membranes are moist. Posterior pharynx clear of any exudate or lesions.Normal dentition.  Neck: normal, supple, no masses, no thyromegaly Respiratory: clear to auscultation bilaterally, no wheezing, no crackles. Normal respiratory effort. No accessory muscle use.  Cardiovascular: Regular rate and rhythm, no murmurs / rubs / gallops. No extremity edema. 2+ pedal pulses. No carotid bruits.  Abdomen: no tenderness, no masses palpated. No hepatosplenomegaly. Bowel sounds positive.  Musculoskeletal: no clubbing / cyanosis. No joint deformity upper and lower  extremities. Good ROM, no contractures. Normal muscle tone.  Skin: no rashes, lesions, ulcers. No induration Neurologic: CN 2-12 grossly intact. Mild decrease of left biceps and deltoid, and wrist strength, decrease light touch sensation of the left arm  Psychiatric: Normal judgment and insight. Alert and oriented x 3. Normal mood.     Labs on Admission: I have personally reviewed following labs and imaging studies  CBC: Recent Labs  Lab 03/03/20 1232 03/03/20 1238  WBC  10.5  --   NEUTROABS 5.9  --   HGB 14.9 15.0  HCT 46.9* 44.0  MCV 88.5  --   PLT 198  --    Basic Metabolic Panel: Recent Labs  Lab 03/03/20 1232 03/03/20 1238  NA 134* 135  K 4.6 4.3  CL 99 100  CO2 20*  --   GLUCOSE 316* 319*  BUN 8 11  CREATININE 0.85 0.70  CALCIUM 9.3  --    GFR: CrCl cannot be calculated (Unknown ideal weight.). Liver Function Tests: Recent Labs  Lab 03/03/20 1232  AST 22  ALT 17  ALKPHOS 94  BILITOT 0.8  PROT 7.6  ALBUMIN 3.9   No results for input(s): LIPASE, AMYLASE in the last 168 hours. No results for input(s): AMMONIA in the last 168 hours. Coagulation Profile: Recent Labs  Lab 03/03/20 1232  INR 1.0   Cardiac Enzymes: No results for input(s): CKTOTAL, CKMB, CKMBINDEX, TROPONINI in the last 168 hours. BNP (last 3 results) No results for input(s): PROBNP in the last 8760 hours. HbA1C: No results for input(s): HGBA1C in the last 72 hours. CBG: No results for input(s): GLUCAP in the last 168 hours. Lipid Profile: No results for input(s): CHOL, HDL, LDLCALC, TRIG, CHOLHDL, LDLDIRECT in the last 72 hours. Thyroid Function Tests: No results for input(s): TSH, T4TOTAL, FREET4, T3FREE, THYROIDAB in the last 72 hours. Anemia Panel: No results for input(s): VITAMINB12, FOLATE, FERRITIN, TIBC, IRON, RETICCTPCT in the last 72 hours. Urine analysis:    Component Value Date/Time   COLORURINE AMBER (A) 03/03/2020 1200   APPEARANCEUR CLOUDY (A) 03/03/2020 1200   LABSPEC 1.031 (H) 03/03/2020 1200   PHURINE 5.0 03/03/2020 1200   GLUCOSEU >=500 (A) 03/03/2020 1200   HGBUR NEGATIVE 03/03/2020 1200   BILIRUBINUR NEGATIVE 03/03/2020 1200   KETONESUR 20 (A) 03/03/2020 1200   PROTEINUR 100 (A) 03/03/2020 1200   UROBILINOGEN 0.2 02/22/2014 1730   NITRITE NEGATIVE 03/03/2020 1200   LEUKOCYTESUR NEGATIVE 03/03/2020 1200    Radiological Exams on Admission: CT Angio Head W or Wo Contrast  Result Date: 03/03/2020 CLINICAL DATA:  Left forearm  numbness intermittently over the last week. History of numerous small infarctions in April of 2020. EXAM: CT ANGIOGRAPHY HEAD AND NECK TECHNIQUE: Multidetector CT imaging of the head and neck was performed using the standard protocol during bolus administration of intravenous contrast. Multiplanar CT image reconstructions and MIPs were obtained to evaluate the vascular anatomy. Carotid stenosis measurements (when applicable) are obtained utilizing NASCET criteria, using the distal internal carotid diameter as the denominator. CONTRAST:  73mL OMNIPAQUE IOHEXOL 350 MG/ML SOLN COMPARISON:  MRI and CT angiography 01/22/2019 FINDINGS: CT HEAD FINDINGS Brain: The brain shows a normal appearance without evidence of malformation, atrophy, old or acute small or large vessel infarction, mass lesion, hemorrhage, hydrocephalus or extra-axial collection. Vascular: No hyperdense vessel. No evidence of atherosclerotic calcification. Skull: Normal.  No traumatic finding.  No focal bone lesion. Sinuses/Orbits: Sinuses are clear. Orbits appear normal. Mastoids are clear. Other: None significant CTA NECK  FINDINGS Aortic arch: Aortic arch appears normal. Branching pattern is normal without origin stenosis. Right carotid system: Common carotid artery widely patent to the bifurcation. Carotid bifurcation is normal. Cervical internal carotid artery appears slightly narrow in general, with mild dilatation just beneath the skull base. As question previously, this could possibly represent fibromuscular dysplasia. Left carotid system: Common carotid artery widely patent to the bifurcation. Carotid bifurcation is normal. Also on this side, the cervical internal carotid artery is somewhat narrow in general with mild dilatation just inferior to the skull base. Question fibromuscular dysplasia. Vertebral arteries: Both vertebral arteries are widely patent at their origins and through the cervical region to the foramen magnum. Skeleton: Normal  Other neck: No mass or lymphadenopathy. Upper chest: Normal Review of the MIP images confirms the above findings CTA HEAD FINDINGS Anterior circulation: Both internal carotid arteries are patent through the skull base and siphon regions. Carotid siphon vessels appear narrow, right more than left, without focal stenosis. Supraclinoid internal carotid arteries regains a more normal caliber. The anterior and middle cerebral vessels are patent without large or medium vessel occlusion. Posterior circulation: Both vertebral arteries are widely patent to the basilar. No basilar stenosis. Posterior circulation branch vessels are normal. Venous sinuses: Patent and normal. Anatomic variants: None significant. Review of the MIP images confirms the above findings IMPRESSION: No appreciable change since the study of last year. No intracranial large or medium vessel occlusion or correctable proximal stenosis. Both cervical internal carotid arteries are somewhat narrow and tortuous, with mild dilatation just inferior to the skull base. This could be due to fibromuscular disease or atherosclerotic disease. Both internal carotid arteries are small and slightly irregular in the carotid siphon regions, most consistent with atherosclerotic disease in this patient with a history of diabetes and hypertension. Electronically Signed   By: Paulina Fusi M.D.   On: 03/03/2020 14:08   CT Angio Neck W and/or Wo Contrast  Result Date: 03/03/2020 CLINICAL DATA:  Left forearm numbness intermittently over the last week. History of numerous small infarctions in April of 2020. EXAM: CT ANGIOGRAPHY HEAD AND NECK TECHNIQUE: Multidetector CT imaging of the head and neck was performed using the standard protocol during bolus administration of intravenous contrast. Multiplanar CT image reconstructions and MIPs were obtained to evaluate the vascular anatomy. Carotid stenosis measurements (when applicable) are obtained utilizing NASCET criteria, using  the distal internal carotid diameter as the denominator. CONTRAST:  70mL OMNIPAQUE IOHEXOL 350 MG/ML SOLN COMPARISON:  MRI and CT angiography 01/22/2019 FINDINGS: CT HEAD FINDINGS Brain: The brain shows a normal appearance without evidence of malformation, atrophy, old or acute small or large vessel infarction, mass lesion, hemorrhage, hydrocephalus or extra-axial collection. Vascular: No hyperdense vessel. No evidence of atherosclerotic calcification. Skull: Normal.  No traumatic finding.  No focal bone lesion. Sinuses/Orbits: Sinuses are clear. Orbits appear normal. Mastoids are clear. Other: None significant CTA NECK FINDINGS Aortic arch: Aortic arch appears normal. Branching pattern is normal without origin stenosis. Right carotid system: Common carotid artery widely patent to the bifurcation. Carotid bifurcation is normal. Cervical internal carotid artery appears slightly narrow in general, with mild dilatation just beneath the skull base. As question previously, this could possibly represent fibromuscular dysplasia. Left carotid system: Common carotid artery widely patent to the bifurcation. Carotid bifurcation is normal. Also on this side, the cervical internal carotid artery is somewhat narrow in general with mild dilatation just inferior to the skull base. Question fibromuscular dysplasia. Vertebral arteries: Both vertebral arteries are widely patent at  their origins and through the cervical region to the foramen magnum. Skeleton: Normal Other neck: No mass or lymphadenopathy. Upper chest: Normal Review of the MIP images confirms the above findings CTA HEAD FINDINGS Anterior circulation: Both internal carotid arteries are patent through the skull base and siphon regions. Carotid siphon vessels appear narrow, right more than left, without focal stenosis. Supraclinoid internal carotid arteries regains a more normal caliber. The anterior and middle cerebral vessels are patent without large or medium vessel  occlusion. Posterior circulation: Both vertebral arteries are widely patent to the basilar. No basilar stenosis. Posterior circulation branch vessels are normal. Venous sinuses: Patent and normal. Anatomic variants: None significant. Review of the MIP images confirms the above findings IMPRESSION: No appreciable change since the study of last year. No intracranial large or medium vessel occlusion or correctable proximal stenosis. Both cervical internal carotid arteries are somewhat narrow and tortuous, with mild dilatation just inferior to the skull base. This could be due to fibromuscular disease or atherosclerotic disease. Both internal carotid arteries are small and slightly irregular in the carotid siphon regions, most consistent with atherosclerotic disease in this patient with a history of diabetes and hypertension. Electronically Signed   By: Paulina Fusi M.D.   On: 03/03/2020 14:08    EKG: Independently reviewed.  ST depression and T inversion on III and aVF  Assessment/Plan Active Problems:   Stroke (cerebrum) (HCC)  Left arm paresis and paresthesia -Rule out new stroke, if so, this is likely related to her not taking her stroke meds and cocaine abuse. -Start Coreg since she is tachy too. Continue ACEI -MRI and Echo -Start ASA and Statin -Tele obs, expect discharge in nest 24 hours after stroke workup done.  Abnormal EKG -Lead III and aVF showing ST depression and T inversion, which is new compared to last year's, but no chest pain as of now, will order Trop level to rule out ACS.  IIDM -Lantus plus sliding scale for now. -Hold Metformin for 72 hours, she got contrast today.  HTN urgency -PO BP meds -Allow permissive HTN 180/105  Cocaine abuse -Coreg, outpt Drug Rehab  DVT prophylaxis: Lovenox Code Status: Full Family Communication: None Disposition Plan: Likely can be discharged with home PT likely, in next 24 hours Consults called: None Admission status: Tele  obs   Emeline General MD [facility] Pager [phone]    03/03/2020, 3:31 PM

## 2020-03-03 NOTE — ED Provider Notes (Signed)
[facility] EMERGENCY DEPARTMENT Provider Note   CSN: [id number] Arrival date & time: 03/03/20  1007     History No chief complaint on file.   Ebony Matthews is a 39 y.o. female.  HPI Patient presents with around a week history of left forearm numbness weakness and tingling at times.  States the severity will come and go somewhat but has been there for around the last week.  States she has some trouble using left hand.  States briefly her left face felt numb but that was just for a few minutes.  No trauma.  No neck pain.  Has a history of stroke around a year ago.  Has not followed up for.  Supposed to be on Metformin and blood pressure medicines also on neither of those.  Does occasionally use cocaine.  Also continues to smoke.    Past Medical History:  Diagnosis Date  . Diabetes mellitus without complication (HCC)   . Hypertension     Patient Active Problem List   Diagnosis Date Noted  . TIA (transient ischemic attack) 01/22/2019  . Diabetes mellitus due to underlying condition without complications (HCC) 05/30/2014  . Essential hypertension, benign 05/30/2014  . Smoking 05/30/2014    Past Surgical History:  Procedure Laterality Date  . NO PAST SURGERIES       OB History    Gravida  1   Para      Term      Preterm      AB  1   Living        SAB  1   TAB      Ectopic      Multiple      Live Births              Family History  Problem Relation Age of Onset  . Diabetes Other   . Hypertension Maternal Grandmother     Social History   Tobacco Use  . Smoking status: Current Every Day Smoker    Packs/day: 1.00    Years: 15.00    Pack years: 15.00  . Smokeless tobacco: Never Used  Substance Use Topics  . Alcohol use: No  . Drug use: Yes    Types: Marijuana    Comment: occasional    Home Medications Prior to Admission medications   Medication Sig Start Date End Date Taking? Authorizing Provider  aspirin 325 MG  tablet Take 1 tablet (325 mg total) by mouth daily. 01/24/19   Glade Lloyd, MD  atorvastatin (LIPITOR) 40 MG tablet Take 1 tablet (40 mg total) by mouth daily at 6 PM. 01/24/19   Glade Lloyd, MD  lisinopril (ZESTRIL) 10 MG tablet Take 1 tablet (10 mg total) by mouth daily for 30 days. 01/27/19 02/26/19  Glade Lloyd, MD  metFORMIN (GLUCOPHAGE) 1000 MG tablet Take 1 tablet (1,000 mg total) by mouth 2 (two) times daily. 01/24/19   Glade Lloyd, MD    Allergies    Patient has no known allergies.  Review of Systems   Review of Systems  Constitutional: Negative for appetite change.  Respiratory: Negative for chest tightness and shortness of breath.   Cardiovascular: Negative for chest pain.  Gastrointestinal: Negative for abdominal pain.  Musculoskeletal: Negative for back pain.  Skin: Negative for rash.  Neurological: Positive for weakness and numbness.    Physical Exam Updated Vital Signs BP (!) 191/107 (BP Location: Right Arm)   Pulse (!) 105   Temp 99.1 F (37.3 C) (Oral)  Resp 20   SpO2 98%   Physical Exam Vitals and nursing note reviewed.  HENT:     Head: Normocephalic.  Eyes:     Extraocular Movements: Extraocular movements intact.     Pupils: Pupils are equal, round, and reactive to light.  Cardiovascular:     Rate and Rhythm: Regular rhythm.  Pulmonary:     Breath sounds: No wheezing or rhonchi.  Abdominal:     Tenderness: There is no abdominal tenderness.  Musculoskeletal:     Cervical back: Neck supple.  Skin:    General: Skin is warm.     Capillary Refill: Capillary refill takes less than 2 seconds.  Neurological:     Mental Status: She is alert and oriented to person, place, and time.     Comments: Face symmetric.  Eye movements intact.  Good grip strength bilaterally.  Good straight leg raise bilaterally.  Patient able to thumbs up okay and cross fingers bilaterally.  Sensation grossly intact bilateral upper extremities.  However finger nose is off on  the left upper extremity.  Normal speech.     ED Results / Procedures / Treatments   Labs (all labs ordered are listed, but only abnormal results are displayed) Labs Reviewed  CBC - Abnormal; Notable for the following components:      Result Value   RBC 5.30 (*)    HCT 46.9 (*)    RDW 16.0 (*)    All other components within normal limits  DIFFERENTIAL - Abnormal; Notable for the following components:   Lymphs Abs 4.1 (*)    All other components within normal limits  COMPREHENSIVE METABOLIC PANEL - Abnormal; Notable for the following components:   Sodium 134 (*)    CO2 20 (*)    Glucose, Bld 316 (*)    All other components within normal limits  RAPID URINE DRUG SCREEN, HOSP PERFORMED - Abnormal; Notable for the following components:   Cocaine POSITIVE (*)    Tetrahydrocannabinol POSITIVE (*)    All other components within normal limits  URINALYSIS, ROUTINE W REFLEX MICROSCOPIC - Abnormal; Notable for the following components:   Color, Urine AMBER (*)    APPearance CLOUDY (*)    Specific Gravity, Urine 1.031 (*)    Glucose, UA >=500 (*)    Ketones, ur 20 (*)    Protein, ur 100 (*)    All other components within normal limits  I-STAT CHEM 8, ED - Abnormal; Notable for the following components:   Glucose, Bld 319 (*)    Calcium, Ion 1.13 (*)    All other components within normal limits  SARS CORONAVIRUS 2 BY RT PCR (HOSPITAL ORDER, [place] [facility])  ETHANOL  PROTIME-INR  APTT  I-STAT BETA HCG BLOOD, ED ([facility], [facility], [facility] ONLY)    EKG EKG Interpretation  Date/Time:  Saturday March 03 2020 10:12:15 EDT Ventricular Rate:  117 PR Interval:  126 QRS Duration: 62 QT Interval:  298 QTC Calculation: 415 R Axis:   57 Text Interpretation: Sinus tachycardia Septal infarct , age undetermined ST & T wave abnormality, consider inferior ischemia Abnormal ECG Confirmed by Davonna Belling [phone]) on 03/03/2020 12:46:30 PM   Radiology CT Angio Head W or Wo  Contrast  Result Date: 03/03/2020 CLINICAL DATA:  Left forearm numbness intermittently over the last week. History of numerous small infarctions in April of 2020. EXAM: CT ANGIOGRAPHY HEAD AND NECK TECHNIQUE: Multidetector CT imaging of the head and neck was performed using the standard  protocol during bolus administration of intravenous contrast. Multiplanar CT image reconstructions and MIPs were obtained to evaluate the vascular anatomy. Carotid stenosis measurements (when applicable) are obtained utilizing NASCET criteria, using the distal internal carotid diameter as the denominator. CONTRAST:  89mL OMNIPAQUE IOHEXOL 350 MG/ML SOLN COMPARISON:  MRI and CT angiography 01/22/2019 FINDINGS: CT HEAD FINDINGS Brain: The brain shows a normal appearance without evidence of malformation, atrophy, old or acute small or large vessel infarction, mass lesion, hemorrhage, hydrocephalus or extra-axial collection. Vascular: No hyperdense vessel. No evidence of atherosclerotic calcification. Skull: Normal.  No traumatic finding.  No focal bone lesion. Sinuses/Orbits: Sinuses are clear. Orbits appear normal. Mastoids are clear. Other: None significant CTA NECK FINDINGS Aortic arch: Aortic arch appears normal. Branching pattern is normal without origin stenosis. Right carotid system: Common carotid artery widely patent to the bifurcation. Carotid bifurcation is normal. Cervical internal carotid artery appears slightly narrow in general, with mild dilatation just beneath the skull base. As question previously, this could possibly represent fibromuscular dysplasia. Left carotid system: Common carotid artery widely patent to the bifurcation. Carotid bifurcation is normal. Also on this side, the cervical internal carotid artery is somewhat narrow in general with mild dilatation just inferior to the skull base. Question fibromuscular dysplasia. Vertebral arteries: Both vertebral arteries are widely patent at their origins and through  the cervical region to the foramen magnum. Skeleton: Normal Other neck: No mass or lymphadenopathy. Upper chest: Normal Review of the MIP images confirms the above findings CTA HEAD FINDINGS Anterior circulation: Both internal carotid arteries are patent through the skull base and siphon regions. Carotid siphon vessels appear narrow, right more than left, without focal stenosis. Supraclinoid internal carotid arteries regains a more normal caliber. The anterior and middle cerebral vessels are patent without large or medium vessel occlusion. Posterior circulation: Both vertebral arteries are widely patent to the basilar. No basilar stenosis. Posterior circulation branch vessels are normal. Venous sinuses: Patent and normal. Anatomic variants: None significant. Review of the MIP images confirms the above findings IMPRESSION: No appreciable change since the study of last year. No intracranial large or medium vessel occlusion or correctable proximal stenosis. Both cervical internal carotid arteries are somewhat narrow and tortuous, with mild dilatation just inferior to the skull base. This could be due to fibromuscular disease or atherosclerotic disease. Both internal carotid arteries are small and slightly irregular in the carotid siphon regions, most consistent with atherosclerotic disease in this patient with a history of diabetes and hypertension. Electronically Signed   By: Paulina Fusi M.D.   On: 03/03/2020 14:08   CT Angio Neck W and/or Wo Contrast  Result Date: 03/03/2020 CLINICAL DATA:  Left forearm numbness intermittently over the last week. History of numerous small infarctions in April of 2020. EXAM: CT ANGIOGRAPHY HEAD AND NECK TECHNIQUE: Multidetector CT imaging of the head and neck was performed using the standard protocol during bolus administration of intravenous contrast. Multiplanar CT image reconstructions and MIPs were obtained to evaluate the vascular anatomy. Carotid stenosis measurements (when  applicable) are obtained utilizing NASCET criteria, using the distal internal carotid diameter as the denominator. CONTRAST:  28mL OMNIPAQUE IOHEXOL 350 MG/ML SOLN COMPARISON:  MRI and CT angiography 01/22/2019 FINDINGS: CT HEAD FINDINGS Brain: The brain shows a normal appearance without evidence of malformation, atrophy, old or acute small or large vessel infarction, mass lesion, hemorrhage, hydrocephalus or extra-axial collection. Vascular: No hyperdense vessel. No evidence of atherosclerotic calcification. Skull: Normal.  No traumatic finding.  No focal bone lesion.  Sinuses/Orbits: Sinuses are clear. Orbits appear normal. Mastoids are clear. Other: None significant CTA NECK FINDINGS Aortic arch: Aortic arch appears normal. Branching pattern is normal without origin stenosis. Right carotid system: Common carotid artery widely patent to the bifurcation. Carotid bifurcation is normal. Cervical internal carotid artery appears slightly narrow in general, with mild dilatation just beneath the skull base. As question previously, this could possibly represent fibromuscular dysplasia. Left carotid system: Common carotid artery widely patent to the bifurcation. Carotid bifurcation is normal. Also on this side, the cervical internal carotid artery is somewhat narrow in general with mild dilatation just inferior to the skull base. Question fibromuscular dysplasia. Vertebral arteries: Both vertebral arteries are widely patent at their origins and through the cervical region to the foramen magnum. Skeleton: Normal Other neck: No mass or lymphadenopathy. Upper chest: Normal Review of the MIP images confirms the above findings CTA HEAD FINDINGS Anterior circulation: Both internal carotid arteries are patent through the skull base and siphon regions. Carotid siphon vessels appear narrow, right more than left, without focal stenosis. Supraclinoid internal carotid arteries regains a more normal caliber. The anterior and middle  cerebral vessels are patent without large or medium vessel occlusion. Posterior circulation: Both vertebral arteries are widely patent to the basilar. No basilar stenosis. Posterior circulation branch vessels are normal. Venous sinuses: Patent and normal. Anatomic variants: None significant. Review of the MIP images confirms the above findings IMPRESSION: No appreciable change since the study of last year. No intracranial large or medium vessel occlusion or correctable proximal stenosis. Both cervical internal carotid arteries are somewhat narrow and tortuous, with mild dilatation just inferior to the skull base. This could be due to fibromuscular disease or atherosclerotic disease. Both internal carotid arteries are small and slightly irregular in the carotid siphon regions, most consistent with atherosclerotic disease in this patient with a history of diabetes and hypertension. Electronically Signed   By: Paulina Fusi M.D.   On: 03/03/2020 14:08    Procedures Procedures (including critical care time)  Medications Ordered in ED Medications  iohexol (OMNIPAQUE) 350 MG/ML injection 75 mL (75 mLs Intravenous Contrast Given 03/03/20 1350)    ED Course  I have reviewed the triage vital signs and the nursing notes.  Pertinent labs & imaging results that were available during my care of the patient were reviewed by me and considered in my medical decision making (see chart for details).    MDM Rules/Calculators/A&P                     Patient presents with left upper extremity weakness and tingling.  Began around a week ago.  Also had facial numbness at time.  Has had a previous stroke however involved right-sided deficits at that time.  Not a TPA candidate due to time of onset.  CT CTA done reassuring.  However with continued deficits feels the patient would benefit from [facility] the [facility].  Also has some mild hyperglycemia and hypertension.  Has been noncompliant with her medicines.  Will admit to  hospitalist since [facility] although it sounds as if she does not go very frequently. Final Clinical Impression(s) / ED Diagnoses Final diagnoses:  Weakness  Noncompliance with medication regimen    Rx / DC Orders ED Discharge Orders    None       Benjiman Core, MD 03/03/20 1423

## 2020-03-03 NOTE — Progress Notes (Signed)
Received call from tele reporting that they had no data on the pt. Went to pt room to assess, pt was not in the room or bathroom. Checked the unit and common areas within the hospital. Security notified and given a description of the pt. Charge aware.   Pt was concerned about her boyfriend not having a ride home from work and asking about leaving to take him home and then returning to the hospital. Pt instructed that this would not be possible unless the patient signed out AMA and returned to the hospital through the ED. Pt verbalized understanding.

## 2020-03-04 ENCOUNTER — Encounter (HOSPITAL_COMMUNITY): Payer: Self-pay | Admitting: Internal Medicine

## 2020-03-04 ENCOUNTER — Observation Stay (HOSPITAL_COMMUNITY): Payer: Commercial Managed Care - PPO

## 2020-03-04 DIAGNOSIS — I10 Essential (primary) hypertension: Secondary | ICD-10-CM | POA: Diagnosis present

## 2020-03-04 DIAGNOSIS — G8194 Hemiplegia, unspecified affecting left nondominant side: Secondary | ICD-10-CM | POA: Diagnosis present

## 2020-03-04 DIAGNOSIS — I6389 Other cerebral infarction: Secondary | ICD-10-CM

## 2020-03-04 DIAGNOSIS — R29702 NIHSS score 2: Secondary | ICD-10-CM | POA: Diagnosis present

## 2020-03-04 DIAGNOSIS — Z7984 Long term (current) use of oral hypoglycemic drugs: Secondary | ICD-10-CM | POA: Diagnosis not present

## 2020-03-04 DIAGNOSIS — Z9114 Patient's other noncompliance with medication regimen: Secondary | ICD-10-CM | POA: Diagnosis not present

## 2020-03-04 DIAGNOSIS — F1410 Cocaine abuse, uncomplicated: Secondary | ICD-10-CM | POA: Diagnosis present

## 2020-03-04 DIAGNOSIS — F17210 Nicotine dependence, cigarettes, uncomplicated: Secondary | ICD-10-CM | POA: Diagnosis present

## 2020-03-04 DIAGNOSIS — I639 Cerebral infarction, unspecified: Secondary | ICD-10-CM | POA: Diagnosis present

## 2020-03-04 DIAGNOSIS — Z20822 Contact with and (suspected) exposure to covid-19: Secondary | ICD-10-CM | POA: Diagnosis present

## 2020-03-04 DIAGNOSIS — Z79899 Other long term (current) drug therapy: Secondary | ICD-10-CM | POA: Diagnosis not present

## 2020-03-04 DIAGNOSIS — Z9119 Patient's noncompliance with other medical treatment and regimen: Secondary | ICD-10-CM | POA: Diagnosis not present

## 2020-03-04 DIAGNOSIS — Z7982 Long term (current) use of aspirin: Secondary | ICD-10-CM | POA: Diagnosis not present

## 2020-03-04 DIAGNOSIS — E1165 Type 2 diabetes mellitus with hyperglycemia: Secondary | ICD-10-CM | POA: Diagnosis present

## 2020-03-04 DIAGNOSIS — E785 Hyperlipidemia, unspecified: Secondary | ICD-10-CM | POA: Diagnosis present

## 2020-03-04 DIAGNOSIS — R531 Weakness: Secondary | ICD-10-CM | POA: Diagnosis present

## 2020-03-04 DIAGNOSIS — Z8673 Personal history of transient ischemic attack (TIA), and cerebral infarction without residual deficits: Secondary | ICD-10-CM | POA: Diagnosis not present

## 2020-03-04 DIAGNOSIS — I160 Hypertensive urgency: Secondary | ICD-10-CM | POA: Diagnosis present

## 2020-03-04 LAB — HEMOGLOBIN A1C
Hgb A1c MFr Bld: 10.7 % — ABNORMAL HIGH (ref 4.8–5.6)
Mean Plasma Glucose: 260.39 mg/dL

## 2020-03-04 LAB — GLUCOSE, CAPILLARY
Glucose-Capillary: 168 mg/dL — ABNORMAL HIGH (ref 70–99)
Glucose-Capillary: 170 mg/dL — ABNORMAL HIGH (ref 70–99)

## 2020-03-04 LAB — LIPID PANEL
Cholesterol: 258 mg/dL — ABNORMAL HIGH (ref 0–200)
HDL: 29 mg/dL — ABNORMAL LOW
LDL Cholesterol: 202 mg/dL — ABNORMAL HIGH (ref 0–99)
Total CHOL/HDL Ratio: 8.9 ratio
Triglycerides: 133 mg/dL
VLDL: 27 mg/dL (ref 0–40)

## 2020-03-04 LAB — ECHOCARDIOGRAM COMPLETE: Height: 67 in

## 2020-03-04 MED ORDER — GLIPIZIDE 5 MG PO TABS: 5.0000 mg | ORAL_TABLET | Freq: Two times a day (BID) | ORAL | 0 refills | Status: DC

## 2020-03-04 MED ORDER — HYDROCHLOROTHIAZIDE 12.5 MG PO CAPS: 12.5000 mg | ORAL_CAPSULE | Freq: Every day | ORAL | 0 refills | Status: DC

## 2020-03-04 MED ORDER — CLOPIDOGREL BISULFATE 75 MG PO TABS
75.0000 mg | ORAL_TABLET | Freq: Every day | ORAL | 0 refills | Status: AC
Start: 2020-03-04 — End: 2020-03-25

## 2020-03-04 MED ORDER — METFORMIN HCL 1000 MG PO TABS: 1000.0000 mg | ORAL_TABLET | Freq: Two times a day (BID) | ORAL | 0 refills | Status: DC

## 2020-03-04 MED ORDER — ATORVASTATIN CALCIUM 80 MG PO TABS: 40.0000 mg | ORAL_TABLET | Freq: Every day | ORAL | 0 refills | Status: DC

## 2020-03-04 MED ORDER — ASPIRIN EC 81 MG PO TBEC: 81.0000 mg | DELAYED_RELEASE_TABLET | Freq: Every day | ORAL | Status: DC

## 2020-03-04 MED ORDER — LISINOPRIL 20 MG PO TABS: 20.0000 mg | ORAL_TABLET | Freq: Every day | ORAL | 0 refills | Status: DC

## 2020-03-04 NOTE — Progress Notes (Addendum)
Progress Note    Zauria Dombek  QQV:[id number] DOB: 01-08-1981  DOA: 03/03/2020 PCP: Jordan Hawks, NP      Brief Narrative:    Medical records reviewed and are as summarized below:  Krisy Dix is a 39 y.o. female  with medical history significant of Stroke in 2020, HTN, IIDM, HLD, on going cocaine abuse, medical nonadherence, who presented to the hospital with new onset of left arm weakness and numbness of 2 days duration.  She was supposed to be on aspirin, Lipitor, lisinopril and Metformin at home but she says she hadn't taken her medication for about 5 months since she moved to a motel. Work-up revealed acute ischemic infarcts involving the right cerebral hemisphere, left occipital lobe inferior right cerebellum.     Assessment/Plan:   Active Problems:   Stroke (cerebrum) (HCC)   Acute stroke, likely embolic in a patient with history of stroke in the past: Continue aspirin and Lipitor.  2D echo showed EF estimated at 60 to 65%, mild LVH, grade 1 diastolic dysfunction and there was no evidence of intracardiac source of embolism.  No evidence of large vessel occlusion on CTA head and neck.  Follow-up with neurologist for further recommendations.  Type 2 diabetes mellitus with hyperglycemia: Hemoglobin A1c is 10.7.  Use Lantus and NovoLog for glucose control.  The importance of adequate glucose control was emphasized.  Hypertension: BP is uncontrolled.  Discontinue Coreg because of active cocaine abuse.  Continue lisinopril for now.  Dose of lisinopril will be increased to HCTZ will be added depending on blood pressure.  The importance of adequate BP control was emphasized.  Dyslipidemia: Total cholesterol 258, HDL 29, LDL 202.  Continue Lipitor.  Cocaine and tobacco abuse: Patient has been counseled to avoid cocaine and cigarette smoking.  Medical nonadherence: Patient has been counseled on the importance of medical adherence   Body mass index is 29.8  kg/m.   Family Communication/Anticipated D/C date and plan/Code Status   DVT prophylaxis: Lovenox Code Status: Full code Family Communication: Plan discussed with patient Disposition Plan:    Status is: Inpatient  Remains inpatient appropriate because:Unsafe d/c plan and Inpatient level of care appropriate due to severity of illness   Dispo: The patient is from: Home              Anticipated d/c is to: Home              Anticipated d/c date is: 1 day              Patient currently is not medically stable to d/c.                 Subjective:   No complaints.  No unilateral numbness or weakness in the extremities.  Objective:    Vitals:   03/03/20 2302 03/04/20 0314 03/04/20 0815 03/04/20 1123  BP: (!) 151/84 (!) 142/98 (!) 148/88 (!) 141/92  Pulse: 78 85 87 91  Resp: 19 20 18 18   Temp: 98.2 F (36.8 C) 98.3 F (36.8 C) 98.3 F (36.8 C) 98.1 F (36.7 C)  TempSrc: Oral Oral Oral Oral  SpO2: 100% 100% 100% 100%  Height:       No data found.  No intake or output data in the 24 hours ending 03/04/20 1354 There were no vitals filed for this visit.  Exam:  GEN: NAD SKIN: No rash EYES: EOMI ENT: MMM CV: RRR PULM: CTA B ABD: soft, ND, NT, +BS CNS: AAO  x 3, non focal EXT: No edema or tenderness   Data Reviewed:   I have personally reviewed following labs and imaging studies:  Labs: Labs show the following:   Basic Metabolic Panel: Recent Labs  Lab 03/03/20 1232 03/03/20 1238  NA 134* 135  K 4.6 4.3  CL 99 100  CO2 20*  --   GLUCOSE 316* 319*  BUN 8 11  CREATININE 0.85 0.70  CALCIUM 9.3  --    GFR CrCl cannot be calculated (Unknown ideal weight.). Liver Function Tests: Recent Labs  Lab 03/03/20 1232  AST 22  ALT 17  ALKPHOS 94  BILITOT 0.8  PROT 7.6  ALBUMIN 3.9   No results for input(s): LIPASE, AMYLASE in the last 168 hours. No results for input(s): AMMONIA in the last 168 hours. Coagulation profile Recent Labs   Lab 03/03/20 1232  INR 1.0    CBC: Recent Labs  Lab 03/03/20 1232 03/03/20 1238  WBC 10.5  --   NEUTROABS 5.9  --   HGB 14.9 15.0  HCT 46.9* 44.0  MCV 88.5  --   PLT 198  --    Cardiac Enzymes: No results for input(s): CKTOTAL, CKMB, CKMBINDEX, TROPONINI in the last 168 hours. BNP (last 3 results) No results for input(s): PROBNP in the last 8760 hours. CBG: Recent Labs  Lab 03/03/20 1612 03/03/20 2257 03/04/20 0608 03/04/20 1120  GLUCAP 229* 172* 168* 170*   D-Dimer: No results for input(s): DDIMER in the last 72 hours. Hgb A1c: Recent Labs    03/03/20 1803 03/04/20 0619  HGBA1C 10.5* 10.7*   Lipid Profile: Recent Labs    03/04/20 0619  CHOL 258*  HDL 29*  LDLCALC 202*  TRIG 133  CHOLHDL 8.9   Thyroid function studies: No results for input(s): TSH, T4TOTAL, T3FREE, THYROIDAB in the last 72 hours.  Invalid input(s): FREET3 Anemia work up: No results for input(s): VITAMINB12, FOLATE, FERRITIN, TIBC, IRON, RETICCTPCT in the last 72 hours. Sepsis Labs: Recent Labs  Lab 03/03/20 1232  WBC 10.5    Microbiology Recent Results (from the past 240 hour(s))  SARS Coronavirus 2 by RT PCR (hospital order, performed in [facility] lab) Nasopharyngeal Nasopharyngeal Swab     Status: None   Collection Time: 03/03/20  1:05 PM   Specimen: Nasopharyngeal Swab  Result Value Ref Range Status   SARS Coronavirus 2 NEGATIVE NEGATIVE Final    Comment: (NOTE) SARS-CoV-2 target nucleic acids are NOT DETECTED. The SARS-CoV-2 RNA is generally detectable in upper and lower respiratory specimens during the acute phase of infection. The lowest concentration of SARS-CoV-2 viral copies this assay can detect is 250 copies / mL. A negative result does not preclude SARS-CoV-2 infection and should not be used as the sole basis for treatment or other patient management decisions.  A negative result may occur with improper specimen collection / handling, submission of  specimen other than nasopharyngeal swab, presence of viral mutation(s) within the areas targeted by this assay, and inadequate number of viral copies (<250 copies / mL). A negative result must be combined with clinical observations, patient history, and epidemiological information. Fact Sheet for Patients:   BoilerBrush.com.cy Fact Sheet for Healthcare Providers: https://pope.com/ This test is not yet approved or cleared  by the [place] FDA and has been authorized for detection and/or diagnosis of SARS-CoV-2 by FDA under an Emergency Use Authorization (EUA).  This EUA will remain in effect (meaning this test can be used) for the duration of the  COVID-19 declaration under Section 564(b)(1) of the Act, 21 U.S.C. section 360bbb-3(b)(1), unless the authorization is terminated or revoked sooner. Performed at [facility], [address], [place], [place] [address]     Procedures and diagnostic studies:  CT Angio Head W or Wo Contrast  Result Date: 03/03/2020 CLINICAL DATA:  Left forearm numbness intermittently over the last week. History of numerous small infarctions in April of 2020. EXAM: CT ANGIOGRAPHY HEAD AND NECK TECHNIQUE: Multidetector CT imaging of the head and neck was performed using the standard protocol during bolus administration of intravenous contrast. Multiplanar CT image reconstructions and MIPs were obtained to evaluate the vascular anatomy. Carotid stenosis measurements (when applicable) are obtained utilizing NASCET criteria, using the distal internal carotid diameter as the denominator. CONTRAST:  20mL OMNIPAQUE IOHEXOL 350 MG/ML SOLN COMPARISON:  MRI and CT angiography 01/22/2019 FINDINGS: CT HEAD FINDINGS Brain: The brain shows a normal appearance without evidence of malformation, atrophy, old or acute small or large vessel infarction, mass lesion, hemorrhage, hydrocephalus or extra-axial collection. Vascular: No  hyperdense vessel. No evidence of atherosclerotic calcification. Skull: Normal.  No traumatic finding.  No focal bone lesion. Sinuses/Orbits: Sinuses are clear. Orbits appear normal. Mastoids are clear. Other: None significant CTA NECK FINDINGS Aortic arch: Aortic arch appears normal. Branching pattern is normal without origin stenosis. Right carotid system: Common carotid artery widely patent to the bifurcation. Carotid bifurcation is normal. Cervical internal carotid artery appears slightly narrow in general, with mild dilatation just beneath the skull base. As question previously, this could possibly represent fibromuscular dysplasia. Left carotid system: Common carotid artery widely patent to the bifurcation. Carotid bifurcation is normal. Also on this side, the cervical internal carotid artery is somewhat narrow in general with mild dilatation just inferior to the skull base. Question fibromuscular dysplasia. Vertebral arteries: Both vertebral arteries are widely patent at their origins and through the cervical region to the foramen magnum. Skeleton: Normal Other neck: No mass or lymphadenopathy. Upper chest: Normal Review of the MIP images confirms the above findings CTA HEAD FINDINGS Anterior circulation: Both internal carotid arteries are patent through the skull base and siphon regions. Carotid siphon vessels appear narrow, right more than left, without focal stenosis. Supraclinoid internal carotid arteries regains a more normal caliber. The anterior and middle cerebral vessels are patent without large or medium vessel occlusion. Posterior circulation: Both vertebral arteries are widely patent to the basilar. No basilar stenosis. Posterior circulation branch vessels are normal. Venous sinuses: Patent and normal. Anatomic variants: None significant. Review of the MIP images confirms the above findings IMPRESSION: No appreciable change since the study of last year. No intracranial large or medium vessel  occlusion or correctable proximal stenosis. Both cervical internal carotid arteries are somewhat narrow and tortuous, with mild dilatation just inferior to the skull base. This could be due to fibromuscular disease or atherosclerotic disease. Both internal carotid arteries are small and slightly irregular in the carotid siphon regions, most consistent with atherosclerotic disease in this patient with a history of diabetes and hypertension. Electronically Signed   By: Paulina Fusi M.D.   On: 03/03/2020 14:08   DG Chest 2 View  Result Date: 03/03/2020 CLINICAL DATA:  Left arm numbness.  Struck. EXAM: CHEST - 2 VIEW COMPARISON:  01/23/2019 FINDINGS: The lungs appear clear. Cardiac and mediastinal contours normal. No pleural effusion identified. Mild midthoracic spondylosis and endplate sclerosis. Contrast medium in the renal collecting systems. IMPRESSION: No active cardiopulmonary disease is radiographically apparent. Electronically Signed  By: Gaylyn Rong M.D.   On: 03/03/2020 16:37   CT Angio Neck W and/or Wo Contrast  Result Date: 03/03/2020 CLINICAL DATA:  Left forearm numbness intermittently over the last week. History of numerous small infarctions in April of 2020. EXAM: CT ANGIOGRAPHY HEAD AND NECK TECHNIQUE: Multidetector CT imaging of the head and neck was performed using the standard protocol during bolus administration of intravenous contrast. Multiplanar CT image reconstructions and MIPs were obtained to evaluate the vascular anatomy. Carotid stenosis measurements (when applicable) are obtained utilizing NASCET criteria, using the distal internal carotid diameter as the denominator. CONTRAST:  75mL OMNIPAQUE IOHEXOL 350 MG/ML SOLN COMPARISON:  MRI and CT angiography 01/22/2019 FINDINGS: CT HEAD FINDINGS Brain: The brain shows a normal appearance without evidence of malformation, atrophy, old or acute small or large vessel infarction, mass lesion, hemorrhage, hydrocephalus or extra-axial  collection. Vascular: No hyperdense vessel. No evidence of atherosclerotic calcification. Skull: Normal.  No traumatic finding.  No focal bone lesion. Sinuses/Orbits: Sinuses are clear. Orbits appear normal. Mastoids are clear. Other: None significant CTA NECK FINDINGS Aortic arch: Aortic arch appears normal. Branching pattern is normal without origin stenosis. Right carotid system: Common carotid artery widely patent to the bifurcation. Carotid bifurcation is normal. Cervical internal carotid artery appears slightly narrow in general, with mild dilatation just beneath the skull base. As question previously, this could possibly represent fibromuscular dysplasia. Left carotid system: Common carotid artery widely patent to the bifurcation. Carotid bifurcation is normal. Also on this side, the cervical internal carotid artery is somewhat narrow in general with mild dilatation just inferior to the skull base. Question fibromuscular dysplasia. Vertebral arteries: Both vertebral arteries are widely patent at their origins and through the cervical region to the foramen magnum. Skeleton: Normal Other neck: No mass or lymphadenopathy. Upper chest: Normal Review of the MIP images confirms the above findings CTA HEAD FINDINGS Anterior circulation: Both internal carotid arteries are patent through the skull base and siphon regions. Carotid siphon vessels appear narrow, right more than left, without focal stenosis. Supraclinoid internal carotid arteries regains a more normal caliber. The anterior and middle cerebral vessels are patent without large or medium vessel occlusion. Posterior circulation: Both vertebral arteries are widely patent to the basilar. No basilar stenosis. Posterior circulation branch vessels are normal. Venous sinuses: Patent and normal. Anatomic variants: None significant. Review of the MIP images confirms the above findings IMPRESSION: No appreciable change since the study of last year. No intracranial  large or medium vessel occlusion or correctable proximal stenosis. Both cervical internal carotid arteries are somewhat narrow and tortuous, with mild dilatation just inferior to the skull base. This could be due to fibromuscular disease or atherosclerotic disease. Both internal carotid arteries are small and slightly irregular in the carotid siphon regions, most consistent with atherosclerotic disease in this patient with a history of diabetes and hypertension. Electronically Signed   By: Paulina Fusi M.D.   On: 03/03/2020 14:08   MR BRAIN WO CONTRAST  Result Date: 03/03/2020 CLINICAL DATA:  Initial evaluation for acute stroke. Left arm weakness and numbness. EXAM: MRI HEAD WITHOUT CONTRAST TECHNIQUE: Multiplanar, multiecho pulse sequences of the brain and surrounding structures were obtained without intravenous contrast. COMPARISON:  Prior CTA from earlier the same day as well as previous MRI from 01/23/2019. FINDINGS: Brain: Cerebral volume within normal limits for age. Patchy T2/FLAIR hyperintensity seen involving the periventricular and deep white matter both cerebral hemispheres, most consistent with chronic small vessel ischemic disease, mild to moderate in  nature and advanced for patient age. Changes are mildly progressed as compared to previous. Additionally, a small area of focal encephalomalacia and gliosis near the right middle cerebellar peduncle consistent with a small chronic infarct, chronic in appearance, but new as compared to previous (series 13, image 6). Few scattered multifocal predominantly subcentimeter foci of restricted diffusion seen involving the right cerebral hemisphere, with involvement of the right frontal and parietal lobes as well as the right temporal occipital region. Four reference purposes, largest area of infarction measures 1 cm at the posterior right temporal region (series 7, image 68). Foci are predominantly watershed in distribution. Additional subcentimeter subcortical  infarct noted at the left occipital lobe (series 7, image 69). Possible additional subcentimeter infarct at the inferior medial right cerebellum (series 7, image 57). No associated hemorrhage or mass effect. No mass lesion, midline shift or mass effect. No hydrocephalus or extra-axial fluid collection. Pituitary gland suprasellar region within normal limits. Midline structures intact. Vascular: Major intracranial vascular flow voids are maintained. Skull and upper cervical spine: Craniocervical junction within normal limits. Bone marrow signal intensity normal. Probable small lipoma noted at the left frontal scalp. Sinuses/Orbits: Globes and orbital soft tissues within normal limits. Mild scattered mucosal thickening noted throughout the paranasal sinuses, most notable at the left maxillary sinus. Trace left mastoid effusion noted, of doubtful significance. Other: None. IMPRESSION: 1. Scattered multifocal subcentimeter acute ischemic infarcts involving the right cerebral hemisphere as above. Few additional subcentimeter infarcts involve the left occipital lobe and inferior right cerebellum. No associated hemorrhage or mass effect. 2. Small chronic infarct involving the right middle cerebellar peduncle, chronic in appearance, but new as compared to previous. 3. Underlying mild to moderate chronic microvascular ischemic disease, mildly progressed from previous. Electronically Signed   By: Rise Mu M.D.   On: 03/03/2020 23:07   ECHOCARDIOGRAM COMPLETE  Result Date: 03/04/2020    ECHOCARDIOGRAM REPORT   Patient Name:   AAYUSHI SOLORZANO Date of Exam: 03/04/2020 Medical Rec #:  [id number]        Height:       67.0 in Accession #:    [id number]       Weight:       190.3 lb Date of Birth:  Sep 02, 1981        BSA:          1.980 m Patient Age:    38 years         BP:           142/98 mmHg Patient Gender: F                HR:           79 bpm. Exam Location:  Inpatient Procedure: 2D Echo Indications:    Stroke  I163.9  History:        Patient has prior history of Echocardiogram examinations, most                 recent 01/23/2019. TIA; Risk Factors:Hypertension and Diabetes.  Sonographer:    Thurman Coyer RDCS (AE) Referring Phys: [id number] Emeline General IMPRESSIONS  1. Left ventricular ejection fraction, by estimation, is 60 to 65%. The left ventricle has normal function. The left ventricle has no regional wall motion abnormalities. There is mild left ventricular hypertrophy. Left ventricular diastolic parameters are consistent with Grade I diastolic dysfunction (impaired relaxation).  2. Right ventricular systolic function is normal. The right ventricular size is normal.  3. The mitral valve is normal in  structure. No evidence of mitral valve regurgitation. No evidence of mitral stenosis.  4. The aortic valve is normal in structure. Aortic valve regurgitation is not visualized. No aortic stenosis is present.  5. The inferior vena cava is normal in size with greater than 50% respiratory variability, suggesting right atrial pressure of 3 mmHg. Conclusion(s)/Recommendation(s): No intracardiac source of embolism detected on this transthoracic study. A transesophageal echocardiogram is recommended to exclude cardiac source of embolism if clinically indicated. FINDINGS  Left Ventricle: Left ventricular ejection fraction, by estimation, is 60 to 65%. The left ventricle has normal function. The left ventricle has no regional wall motion abnormalities. The left ventricular internal cavity size was normal in size. There is  mild left ventricular hypertrophy. Left ventricular diastolic parameters are consistent with Grade I diastolic dysfunction (impaired relaxation). Right Ventricle: The right ventricular size is normal. No increase in right ventricular wall thickness. Right ventricular systolic function is normal. Left Atrium: Left atrial size was normal in size. Right Atrium: Right atrial size was normal in size. Pericardium:  There is no evidence of pericardial effusion. Mitral Valve: The mitral valve is normal in structure. Normal mobility of the mitral valve leaflets. No evidence of mitral valve regurgitation. No evidence of mitral valve stenosis. Tricuspid Valve: The tricuspid valve is normal in structure. Tricuspid valve regurgitation is not demonstrated. No evidence of tricuspid stenosis. Aortic Valve: The aortic valve is normal in structure. Aortic valve regurgitation is not visualized. No aortic stenosis is present. Pulmonic Valve: The pulmonic valve was normal in structure. Pulmonic valve regurgitation is trivial. No evidence of pulmonic stenosis. Aorta: The aortic root is normal in size and structure. Venous: The inferior vena cava is normal in size with greater than 50% respiratory variability, suggesting right atrial pressure of 3 mmHg. IAS/Shunts: No atrial level shunt detected by color flow Doppler.  LEFT VENTRICLE PLAX 2D LVIDd:         4.30 cm  Diastology LVIDs:         3.40 cm  LV e' lateral:   6.20 cm/s LV PW:         1.30 cm  LV E/e' lateral: 8.8 LV IVS:        1.30 cm  LV e' medial:    5.66 cm/s LVOT diam:     2.20 cm  LV E/e' medial:  9.6 LV SV:         55 LV SV Index:   28 LVOT Area:     3.80 cm  RIGHT VENTRICLE RV S prime:     11.10 cm/s TAPSE (M-mode): 1.6 cm LEFT ATRIUM             Index       RIGHT ATRIUM           Index LA diam:        3.90 cm 1.97 cm/m  RA Area:     10.20 cm LA Vol (A2C):   50.5 ml 25.51 ml/m RA Volume:   18.30 ml  9.24 ml/m LA Vol (A4C):   42.6 ml 21.52 ml/m LA Biplane Vol: 47.5 ml 23.99 ml/m  AORTIC VALVE LVOT Vmax:   86.80 cm/s LVOT Vmean:  56.900 cm/s LVOT VTI:    0.145 m  AORTA Ao Root diam: 2.90 cm MITRAL VALVE MV Area (PHT): 3.91 cm    SHUNTS MV Decel Time: 194 msec    Systemic VTI:  0.14 m MV E velocity: 54.40 cm/s  Systemic Diam: 2.20 cm MV A velocity:  57.00 cm/s MV E/A ratio:  0.95 Donato SchultzMark Skains MD Electronically signed by Donato SchultzMark Skains MD Signature Date/Time: 03/04/2020/12:19:41 PM     Final     Medications:   . aspirin  325 mg Oral Daily  . atorvastatin  40 mg Oral q1800  . enoxaparin (LOVENOX) injection  40 mg Subcutaneous Q24H  . insulin aspart  0-15 Units Subcutaneous TID WC  . insulin aspart  0-5 Units Subcutaneous QHS  . insulin glargine  10 Units Subcutaneous Daily  . lisinopril  10 mg Oral Daily   Continuous Infusions:   LOS: 0 days   Esparanza Krider  [facility]     03/04/2020, 1:54 PM

## 2020-03-04 NOTE — Progress Notes (Addendum)
STROKE TEAM PROGRESS NOTE   HISTORY OF PRESENT ILLNESS (per record) Ebony Matthews is a 39 y.o. female with a PMHx of HTN, TIA and DM, who presented to the [facility] ED with a chief complaint of left arm numbness and tingling for about 3 days. Patient stated that on Thursday she was working as an Biomedical scientist and she suddenly had a problem gripping the steering wheel with her left hand. She noticed some numbness, but mostly problems gripping. It has been constant since Thursday and she has been dropping things. It has not gotten worse or any better. She decided to come to the ED today because it has not improved and she did not think she could work this morning. Denies any vision changes, falls, trouble walking, weakness or numbness or tingling in the legs. Endorses smoking cigarettes < 1 pack per day, ETOH use occasionally, smoking cocaine and marijuana occassionally. D/t her current living situation she has not been taking her DM or BP medications. ED course:  CTA:no hemorrhage  BG: 319  BP: 191/107 UDS: +cocaine, +THC MRI: pending Modified Rankin: Rankin Score=0 NIHSS: 2 (drift, dysmetria)   INTERVAL HISTORY No one  is at the bedside.  Patient presented with 3 days of left-sided weakness and numbness and admits to using cocaine and smoking.  She has a prior history of stroke but chose to stop the aspirin though she was instructed to do so.  She wants to go home and is asking for discharge MRI scan of the brain shows scattered tiny right-sided infarcts and small left occipital and right cerebellar infarcts as well.  There is remote age infarct in the right cerebellar peduncle.  Urine drug screen is positive for cocaine and marijuana LDL cholesterol is elevated at 202 mg percent and hemoglobin A1c at 10.5.  2D echo is pending.   OBJECTIVE Vitals:   03/03/20 2302 03/04/20 0314 03/04/20 0815 03/04/20 1123  BP: (!) 151/84 (!) 142/98 (!) 148/88 (!) 141/92  Pulse: 78 85 87 91  Resp: 19 20 18 18    Temp: 98.2 F (36.8 C) 98.3 F (36.8 C) 98.3 F (36.8 C) 98.1 F (36.7 C)  TempSrc: Oral Oral Oral Oral  SpO2: 100% 100% 100% 100%  Height:        CBC:  Recent Labs  Lab 03/03/20 1232 03/03/20 1238  WBC 10.5  --   NEUTROABS 5.9  --   HGB 14.9 15.0  HCT 46.9* 44.0  MCV 88.5  --   PLT 198  --     Basic Metabolic Panel:  Recent Labs  Lab 03/03/20 1232 03/03/20 1238  NA 134* 135  K 4.6 4.3  CL 99 100  CO2 20*  --   GLUCOSE 316* 319*  BUN 8 11  CREATININE 0.85 0.70  CALCIUM 9.3  --     Lipid Panel:     Component Value Date/Time   CHOL 258 (H) 03/04/2020 0619   TRIG 133 03/04/2020 0619   HDL 29 (L) 03/04/2020 0619   CHOLHDL 8.9 03/04/2020 0619   VLDL 27 03/04/2020 0619   LDLCALC 202 (H) 03/04/2020 0619   HgbA1c:  Lab Results  Component Value Date   HGBA1C 10.7 (H) 03/04/2020   Urine Drug Screen:     Component Value Date/Time   LABOPIA NONE DETECTED 03/03/2020 1200   COCAINSCRNUR POSITIVE (A) 03/03/2020 1200   LABBENZ NONE DETECTED 03/03/2020 1200   AMPHETMU NONE DETECTED 03/03/2020 1200   THCU POSITIVE (A) 03/03/2020 1200   LABBARB  NONE DETECTED 03/03/2020 1200    Alcohol Level     Component Value Date/Time   ETH <10 03/03/2020 1232    IMAGING  CT Angio Head W or Wo Contrast CT Angio Neck W and/or Wo Contrast 03/03/2020 IMPRESSION:  No appreciable change since the study of last year. No intracranial large or medium vessel occlusion or correctable proximal stenosis. Both cervical internal carotid arteries are somewhat narrow and tortuous, with mild dilatation just inferior to the skull base. This could be due to fibromuscular disease or atherosclerotic disease. Both internal carotid arteries are small and slightly irregular in the carotid siphon regions, most consistent with atherosclerotic disease in this patient with a history of diabetes and hypertension.   MR BRAIN WO CONTRAST 03/03/2020  IMPRESSION:  1. Scattered multifocal subcentimeter  acute ischemic infarcts involving the right cerebral hemisphere as above. Few additional subcentimeter infarcts involve the left occipital lobe and inferior right cerebellum. No associated hemorrhage or mass effect.  2. Small chronic infarct involving the right middle cerebellar peduncle, chronic in appearance, but new as compared to previous.  3. Underlying mild to moderate chronic microvascular ischemic disease, mildly progressed from previous.   DG Chest 2 View 03/03/2020 IMPRESSION:  No active cardiopulmonary disease is radiographically apparent.   ECHOCARDIOGRAM COMPLETE 03/04/2020 IMPRESSIONS   1. Left ventricular ejection fraction, by estimation, is 60 to 65%. The left ventricle has normal function. The left ventricle has no regional wall motion abnormalities. There is mild left ventricular hypertrophy. Left ventricular diastolic parameters are consistent with Grade I diastolic dysfunction (impaired relaxation).   2. Right ventricular systolic function is normal. The right ventricular size is normal.   3. The mitral valve is normal in structure. No evidence of mitral valve regurgitation. No evidence of mitral stenosis.   4. The aortic valve is normal in structure. Aortic valve regurgitation is not visualized. No aortic stenosis is present.   5. The inferior vena cava is normal in size with greater than 50% respiratory variability, suggesting right atrial pressure of 3 mmHg. Conclusion(s)/Recommendation(s): No intracardiac source of embolism detected on this transthoracic study. A transesophageal echocardiogram is recommended to exclude cardiac source of embolism if clinically indicated.    ECG - SR rate 89 BPM. (See cardiology reading for complete details)   PHYSICAL EXAM Blood pressure (!) 141/92, pulse 91, temperature 98.1 F (36.7 C), temperature source Oral, resp. rate 18, height 5\' 7"  (1.702 m), SpO2 100 %. Pleasant middle-age African-American lady not in distress. . Afebrile. Head  is nontraumatic. Neck is supple without bruit.    Cardiac exam no murmur or gallop. Lungs are clear to auscultation. Distal pulses are well felt. Neurological Exam :  Awake alert oriented x 3 normal speech and language. Mild left lower face asymmetry. Tongue midline. No drift. Mild diminished fine finger movements on left. Orbits right over left upper extremity. Mild left grip weak.. Normal sensation . Normal coordination.      ASSESSMENT/PLAN [name] is a 39 y.o. female with history of HTN, TIA, tobacco use, DM, substance abuse, and non compliance with her prescribed medications presenting with a three day history of inability to grip with her left hand. She did not receive IV t-PA due to late presentation (>4.5 hours from time of onset).  Stroke: Multiple bilateral infarcts - embolic - source unknown.  Resultant mild left upper extremity weakness  Code Stroke CT Head - not ordered    CT head - not ordered  MRI head -  Scattered  multifocal subcentimeter acute ischemic infarcts involving the right cerebral hemisphere as above. Few additional subcentimeter infarcts involve the left occipital lobe and inferior right cerebellum. No associated hemorrhage or mass effect. Small chronic infarct involving the right middle cerebellar peduncle, chronic in appearance, but new as compared to previous. Underlying mild to moderate chronic microvascular ischemic disease, mildly progressed from previous.   MRA head - not ordered  CTA H&N -  No intracranial large or medium vessel occlusion or correctable proximal stenosis. Both cervical internal carotid arteries are somewhat narrow and tortuous, with mild dilatation just inferior to the skull base. This could be due to fibromuscular disease or atherosclerotic disease. Both internal carotid arteries are small and slightly irregular in the carotid siphon regions, most consistent with atherosclerotic disease in this patient with a history of diabetes  and hypertension.   CT Perfusion - not ordered  Carotid Doppler - CTA neck performed - carotid dopplers not indicated.  2D Echo - EF 60 - 65%. No cardiac source of emboli identified.   Sars Corona Virus 2 - negative  LDL - 202  HgbA1c - 10.5  UDS - cocaine and THCU positive  VTE prophylaxis - Lovenox Diet  Diet Order            Diet heart healthy/carb modified Room service appropriate? Yes; Fluid consistency: Thin  Diet effective now              aspirin 325 mg daily prior to admission, now on aspirin 81 mg and Plavix 75 mg daily into 3 weeks followed by aspirin alone  Patient counseled to be compliant with her antithrombotic medications  Ongoing aggressive stroke risk factor management  Therapy recommendations:  pending  Disposition:  Pending  Hypertension  Home BP meds: Zestril  Current BP meds: Lisinopril  Stable . Permissive hypertension (OK if < 220/120) but gradually normalize in 5-7 days  . Long-term BP goal normotensive  Hyperlipidemia  Home Lipid lowering medication:  Lipitor 40 mg daily  LDL 202, goal < 70  Current lipid lowering medication: Lipitor 40 mg daily (consider increasing to 80 mg daily however pt has apparently not been compliant with medications so difficult to say if this reading is accurate)  Continue statin at discharge  Diabetes  Home diabetic meds: metformin  Current diabetic meds: insulin  HgbA1c 10.5, goal < 7.0 Recent Labs    03/03/20 2257 03/04/20 0608 03/04/20 1120  GLUCAP 172* 168* 170*    Other Stroke Risk Factors  Cigarette smoker advised to stop smoking  ETOH use, advised to drink no more than 1 alcoholic beverage per day.  Obesity, Body mass index is 29.8 kg/m., recommend weight loss, diet and exercise as appropriate   Hx stroke/TIA  Non compliance with medications  Substance Abuse  Other Active Problems  Code status - Full code  Substance abuse   Hospital day # 0  She presented with  several days of left sided weakness due to multiple small by cerebral embolic infarcts .  Patient's urine drug screen was positive for cocaine and marijuana use as well as she smokes cigarettes.  She has a previous history of stroke in April 2020 and was seen by [place] and started on aspirin and advised follow-up in the clinic but did not keep it.  She wants to go home.  Recommend aspirin 81 mg and Plavix 75 mg daily for 3 weeks followed by aspirin alone and aggressive risk factor modification.  Statin for elevated lipids.  Patient counseled  to quit smoking cigarettes, marijuana and cocaine but refuses.  Patient is not a good long-term anticoagulation candidate due to history of noncompliance hence will not check a TEE or loop recorder.  Discussed with Dr. Adela Glimpse.  Greater than 50% time during this 35-minute visit was spent on counseling and coordination of care discussion about stroke prevention and treatment and answering questions.  Delia Heady, MD To contact Stroke Continuity provider, please refer to [email or web address]. After hours, contact General Neurology

## 2020-03-04 NOTE — Plan of Care (Signed)
  Problem: Education: Goal: Knowledge of General Education information will improve Description: Including pain rating scale, medication(s)/side effects and non-pharmacologic comfort measures Outcome: Adequate for Discharge   Problem: Health Behavior/Discharge Planning: Goal: Ability to manage health-related needs will improve Outcome: Adequate for Discharge   Problem: Clinical Measurements: Goal: Ability to maintain clinical measurements within normal limits will improve Outcome: Adequate for Discharge Goal: Will remain free from infection Outcome: Adequate for Discharge Goal: Diagnostic test results will improve Outcome: Adequate for Discharge Goal: Respiratory complications will improve Outcome: Adequate for Discharge Goal: Cardiovascular complication will be avoided Outcome: Adequate for Discharge   Problem: Activity: Goal: Risk for activity intolerance will decrease Outcome: Adequate for Discharge   Problem: Nutrition: Goal: Adequate nutrition will be maintained Outcome: Adequate for Discharge   Problem: Coping: Goal: Level of anxiety will decrease Outcome: Adequate for Discharge   Problem: Elimination: Goal: Will not experience complications related to bowel motility Outcome: Adequate for Discharge Goal: Will not experience complications related to urinary retention Outcome: Adequate for Discharge   Problem: Pain Managment: Goal: General experience of comfort will improve Outcome: Adequate for Discharge   Problem: Safety: Goal: Ability to remain free from injury will improve Outcome: Adequate for Discharge   Problem: Skin Integrity: Goal: Risk for impaired skin integrity will decrease Outcome: Adequate for Discharge   Problem: Education: Goal: Knowledge of disease or condition will improve Outcome: Adequate for Discharge Goal: Knowledge of secondary prevention will improve Outcome: Adequate for Discharge Goal: Knowledge of patient specific risk factors  addressed and post discharge goals established will improve Outcome: Adequate for Discharge Goal: Individualized Educational Video(s) Outcome: Adequate for Discharge   Problem: Health Behavior/Discharge Planning: Goal: Ability to manage health-related needs will improve Outcome: Adequate for Discharge   Problem: Self-Care: Goal: Ability to participate in self-care as condition permits will improve Outcome: Adequate for Discharge Goal: Ability to communicate needs accurately will improve Outcome: Adequate for Discharge   Problem: Nutrition: Goal: Risk of aspiration will decrease Outcome: Adequate for Discharge   Problem: Ischemic Stroke/TIA Tissue Perfusion: Goal: Complications of ischemic stroke/TIA will be minimized Outcome: Adequate for Discharge

## 2020-03-04 NOTE — Discharge Summary (Signed)
Physician Discharge Summary  Ebony Matthews ZOX:[id number] DOB: [birth year] DOA: 03/03/2020  PCP: Ebony Cocker, NP  Admit date: 03/03/2020 Discharge date: 03/04/2020  Discharge disposition: Home   Recommendations for Outpatient Follow-Up:   Outpatient follow-up with PCP in 1 week for close monitoring of blood pressure and blood sugar Outpatient follow-up with neurologist in 1 month   Discharge Diagnosis:   Active Problems:   Stroke (cerebrum) (HCC)   Noncompliance with medication regimen   Acute CVA (cerebrovascular accident) [facility])    Discharge Condition: Stable.  Diet recommendation: Heart healthy diet and diabetic diet  Code status: Full code.    Hospital Course:   Ebony Matthews is a 39 y.o. female with medical history significant ofStroke in 2020, HTN, IIDM, HLD, on going cocaine abuse, medical nonadherence, who presented to the hospital with new onset of left arm weakness and numbness of 2 days duration.  She was supposed to be on aspirin, Lipitor, lisinopril and Metformin at home but she says she hadn't taken her medication for about 5 months since she moved to a motel. Work-up revealed acute ischemic infarcts involving the right cerebral hemisphere, left occipital lobe inferior right cerebellum.  2D echo showed EF estimated at 60 to 65%, mild LVH, grade 1 diastolic dysfunction and there was no evidence of intracardiac source of embolism.  CTA head and neck did not show any evidence of large vessel occlusion.  Patient was seen in consultation by the neurologist.  However, patient said she was not ready to quit using cocaine, marijuana and cigarettes.  Case was discussed with Dr. Pearlean Brownie, neurologist, who said that patient is not a candidate for loop recorder or TEE because of medical nonadherence.  He said the patient could be discharged from his standpoint.  The importance of medical adherence was emphasized.  Lisinopril was increased from 10 mg to 20 mg and HCTZ was  added for adequate BP control.  Glipizide was also added for adequate glucose control.  She was previously taking Metformin at home.  She has been given refills/new prescriptions for all her medications.  Discharge plan was discussed with the patient and she verbalized understanding.  The importance of medical adherence was emphasized.    Discharge Exam:   Vitals:   03/04/20 0815 03/04/20 1123  BP: (!) 148/88 (!) 141/92  Pulse: 87 91  Resp: 18 18  Temp: 98.3 F (36.8 C) 98.1 F (36.7 C)  SpO2: 100% 100%   Vitals:   03/03/20 2302 03/04/20 0314 03/04/20 0815 03/04/20 1123  BP: (!) 151/84 (!) 142/98 (!) 148/88 (!) 141/92  Pulse: 78 85 87 91  Resp: Temp: 98.2 F (36.8 C) 98.3 F (36.8 C) 98.3 F (36.8 C) 98.1 F (36.7 C)  TempSrc: Oral Oral Oral Oral  SpO2: 100% 100% 100% 100%  Height:         GEN: NAD SKIN: No rash EYES: EOMI ENT: MMM CV: RRR PULM: CTA B ABD: soft, ND, NT, +BS CNS: AAO x 3, non focal EXT: No edema or tenderness   The results of significant diagnostics from this hospitalization (including imaging, microbiology, ancillary and laboratory) are listed below for reference.     Procedures and Diagnostic Studies:   CT Angio Head W or Wo Contrast  Result Date: 03/03/2020 CLINICAL DATA:  Left forearm numbness intermittently over the last week. History of numerous small infarctions in April of 2020. EXAM: CT ANGIOGRAPHY HEAD AND NECK TECHNIQUE: Multidetector CT imaging of the head and neck  was performed using the standard protocol during bolus administration of intravenous contrast. Multiplanar CT image reconstructions and MIPs were obtained to evaluate the vascular anatomy. Carotid stenosis measurements (when applicable) are obtained utilizing NASCET criteria, using the distal internal carotid diameter as the denominator. CONTRAST:  75mL OMNIPAQUE IOHEXOL 350 MG/ML SOLN COMPARISON:  MRI and CT angiography 01/22/2019 FINDINGS: CT HEAD FINDINGS Brain:  The brain shows a normal appearance without evidence of malformation, atrophy, old or acute small or large vessel infarction, mass lesion, hemorrhage, hydrocephalus or extra-axial collection. Vascular: No hyperdense vessel. No evidence of atherosclerotic calcification. Skull: Normal.  No traumatic finding.  No focal bone lesion. Sinuses/Orbits: Sinuses are clear. Orbits appear normal. Mastoids are clear. Other: None significant CTA NECK FINDINGS Aortic arch: Aortic arch appears normal. Branching pattern is normal without origin stenosis. Right carotid system: Common carotid artery widely patent to the bifurcation. Carotid bifurcation is normal. Cervical internal carotid artery appears slightly narrow in general, with mild dilatation just beneath the skull base. As question previously, this could possibly represent fibromuscular dysplasia. Left carotid system: Common carotid artery widely patent to the bifurcation. Carotid bifurcation is normal. Also on this side, the cervical internal carotid artery is somewhat narrow in general with mild dilatation just inferior to the skull base. Question fibromuscular dysplasia. Vertebral arteries: Both vertebral arteries are widely patent at their origins and through the cervical region to the foramen magnum. Skeleton: Normal Other neck: No mass or lymphadenopathy. Upper chest: Normal Review of the MIP images confirms the above findings CTA HEAD FINDINGS Anterior circulation: Both internal carotid arteries are patent through the skull base and siphon regions. Carotid siphon vessels appear narrow, right more than left, without focal stenosis. Supraclinoid internal carotid arteries regains a more normal caliber. The anterior and middle cerebral vessels are patent without large or medium vessel occlusion. Posterior circulation: Both vertebral arteries are widely patent to the basilar. No basilar stenosis. Posterior circulation branch vessels are normal. Venous sinuses: Patent and  normal. Anatomic variants: None significant. Review of the MIP images confirms the above findings IMPRESSION: No appreciable change since the study of last year. No intracranial large or medium vessel occlusion or correctable proximal stenosis. Both cervical internal carotid arteries are somewhat narrow and tortuous, with mild dilatation just inferior to the skull base. This could be due to fibromuscular disease or atherosclerotic disease. Both internal carotid arteries are small and slightly irregular in the carotid siphon regions, most consistent with atherosclerotic disease in this patient with a history of diabetes and hypertension. Electronically Signed   By: Paulina FusiMark  Shogry M.D.   On: 03/03/2020 14:08   DG Chest 2 View  Result Date: 03/03/2020 CLINICAL DATA:  Left arm numbness.  Struck. EXAM: CHEST - 2 VIEW COMPARISON:  01/23/2019 FINDINGS: The lungs appear clear. Cardiac and mediastinal contours normal. No pleural effusion identified. Mild midthoracic spondylosis and endplate sclerosis. Contrast medium in the renal collecting systems. IMPRESSION: No active cardiopulmonary disease is radiographically apparent. Electronically Signed   By: Gaylyn RongWalter  Liebkemann M.D.   On: 03/03/2020 16:37   CT Angio Neck W and/or Wo Contrast  Result Date: 03/03/2020 CLINICAL DATA:  Left forearm numbness intermittently over the last week. History of numerous small infarctions in April of 2020. EXAM: CT ANGIOGRAPHY HEAD AND NECK TECHNIQUE: Multidetector CT imaging of the head and neck was performed using the standard protocol during bolus administration of intravenous contrast. Multiplanar CT image reconstructions and MIPs were obtained to evaluate the vascular anatomy. Carotid stenosis measurements (when applicable)  are obtained utilizing NASCET criteria, using the distal internal carotid diameter as the denominator. CONTRAST:  49mL OMNIPAQUE IOHEXOL 350 MG/ML SOLN COMPARISON:  MRI and CT angiography 01/22/2019 FINDINGS: CT HEAD  FINDINGS Brain: The brain shows a normal appearance without evidence of malformation, atrophy, old or acute small or large vessel infarction, mass lesion, hemorrhage, hydrocephalus or extra-axial collection. Vascular: No hyperdense vessel. No evidence of atherosclerotic calcification. Skull: Normal.  No traumatic finding.  No focal bone lesion. Sinuses/Orbits: Sinuses are clear. Orbits appear normal. Mastoids are clear. Other: None significant CTA NECK FINDINGS Aortic arch: Aortic arch appears normal. Branching pattern is normal without origin stenosis. Right carotid system: Common carotid artery widely patent to the bifurcation. Carotid bifurcation is normal. Cervical internal carotid artery appears slightly narrow in general, with mild dilatation just beneath the skull base. As question previously, this could possibly represent fibromuscular dysplasia. Left carotid system: Common carotid artery widely patent to the bifurcation. Carotid bifurcation is normal. Also on this side, the cervical internal carotid artery is somewhat narrow in general with mild dilatation just inferior to the skull base. Question fibromuscular dysplasia. Vertebral arteries: Both vertebral arteries are widely patent at their origins and through the cervical region to the foramen magnum. Skeleton: Normal Other neck: No mass or lymphadenopathy. Upper chest: Normal Review of the MIP images confirms the above findings CTA HEAD FINDINGS Anterior circulation: Both internal carotid arteries are patent through the skull base and siphon regions. Carotid siphon vessels appear narrow, right more than left, without focal stenosis. Supraclinoid internal carotid arteries regains a more normal caliber. The anterior and middle cerebral vessels are patent without large or medium vessel occlusion. Posterior circulation: Both vertebral arteries are widely patent to the basilar. No basilar stenosis. Posterior circulation branch vessels are normal. Venous  sinuses: Patent and normal. Anatomic variants: None significant. Review of the MIP images confirms the above findings IMPRESSION: No appreciable change since the study of last year. No intracranial large or medium vessel occlusion or correctable proximal stenosis. Both cervical internal carotid arteries are somewhat narrow and tortuous, with mild dilatation just inferior to the skull base. This could be due to fibromuscular disease or atherosclerotic disease. Both internal carotid arteries are small and slightly irregular in the carotid siphon regions, most consistent with atherosclerotic disease in this patient with a history of diabetes and hypertension. Electronically Signed   By: Nelson Chimes M.D.   On: 03/03/2020 14:08   MR BRAIN WO CONTRAST  Result Date: 03/03/2020 CLINICAL DATA:  Initial evaluation for acute stroke. Left arm weakness and numbness. EXAM: MRI HEAD WITHOUT CONTRAST TECHNIQUE: Multiplanar, multiecho pulse sequences of the brain and surrounding structures were obtained without intravenous contrast. COMPARISON:  Prior CTA from earlier the same day as well as previous MRI from 01/23/2019. FINDINGS: Brain: Cerebral volume within normal limits for age. Patchy T2/FLAIR hyperintensity seen involving the periventricular and deep white matter both cerebral hemispheres, most consistent with chronic small vessel ischemic disease, mild to moderate in nature and advanced for patient age. Changes are mildly progressed as compared to previous. Additionally, a small area of focal encephalomalacia and gliosis near the right middle cerebellar peduncle consistent with a small chronic infarct, chronic in appearance, but new as compared to previous (series 13, image 6). Few scattered multifocal predominantly subcentimeter foci of restricted diffusion seen involving the right cerebral hemisphere, with involvement of the right frontal and parietal lobes as well as the right temporal occipital region. Four reference  purposes, largest area of infarction  measures 1 cm at the posterior right temporal region (series 7, image 68). Foci are predominantly watershed in distribution. Additional subcentimeter subcortical infarct noted at the left occipital lobe (series 7, image 69). Possible additional subcentimeter infarct at the inferior medial right cerebellum (series 7, image 57). No associated hemorrhage or mass effect. No mass lesion, midline shift or mass effect. No hydrocephalus or extra-axial fluid collection. Pituitary gland suprasellar region within normal limits. Midline structures intact. Vascular: Major intracranial vascular flow voids are maintained. Skull and upper cervical spine: Craniocervical junction within normal limits. Bone marrow signal intensity normal. Probable small lipoma noted at the left frontal scalp. Sinuses/Orbits: Globes and orbital soft tissues within normal limits. Mild scattered mucosal thickening noted throughout the paranasal sinuses, most notable at the left maxillary sinus. Trace left mastoid effusion noted, of doubtful significance. Other: None. IMPRESSION: 1. Scattered multifocal subcentimeter acute ischemic infarcts involving the right cerebral hemisphere as above. Few additional subcentimeter infarcts involve the left occipital lobe and inferior right cerebellum. No associated hemorrhage or mass effect. 2. Small chronic infarct involving the right middle cerebellar peduncle, chronic in appearance, but new as compared to previous. 3. Underlying mild to moderate chronic microvascular ischemic disease, mildly progressed from previous. Electronically Signed   By: Rise Mu M.D.   On: 03/03/2020 23:07   ECHOCARDIOGRAM COMPLETE  Result Date: 03/04/2020    ECHOCARDIOGRAM REPORT   Patient Name:   Ebony Matthews Date of Exam: 03/04/2020 Medical Rec #:  [id number]        Height:       67.0 in Accession #:    [id number]       Weight:       190.3 lb Date of Birth:  11-Dec-1980        BSA:           1.980 m Patient Age:    38 years         BP:           142/98 mmHg Patient Gender: F                HR:           79 bpm. Exam Location:  Inpatient Procedure: 2D Echo Indications:    Stroke I163.9  History:        Patient has prior history of Echocardiogram examinations, most                 recent 01/23/2019. TIA; Risk Factors:Hypertension and Diabetes.  Sonographer:    Thurman Coyer RDCS (AE) Referring Phys: [id number] Emeline General IMPRESSIONS  1. Left ventricular ejection fraction, by estimation, is 60 to 65%. The left ventricle has normal function. The left ventricle has no regional wall motion abnormalities. There is mild left ventricular hypertrophy. Left ventricular diastolic parameters are consistent with Grade I diastolic dysfunction (impaired relaxation).  2. Right ventricular systolic function is normal. The right ventricular size is normal.  3. The mitral valve is normal in structure. No evidence of mitral valve regurgitation. No evidence of mitral stenosis.  4. The aortic valve is normal in structure. Aortic valve regurgitation is not visualized. No aortic stenosis is present.  5. The inferior vena cava is normal in size with greater than 50% respiratory variability, suggesting right atrial pressure of 3 mmHg. Conclusion(s)/Recommendation(s): No intracardiac source of embolism detected on this transthoracic study. A transesophageal echocardiogram is recommended to exclude cardiac source of embolism if clinically indicated. FINDINGS  Left Ventricle: Left ventricular ejection fraction,  by estimation, is 60 to 65%. The left ventricle has normal function. The left ventricle has no regional wall motion abnormalities. The left ventricular internal cavity size was normal in size. There is  mild left ventricular hypertrophy. Left ventricular diastolic parameters are consistent with Grade I diastolic dysfunction (impaired relaxation). Right Ventricle: The right ventricular size is normal. No increase in  right ventricular wall thickness. Right ventricular systolic function is normal. Left Atrium: Left atrial size was normal in size. Right Atrium: Right atrial size was normal in size. Pericardium: There is no evidence of pericardial effusion. Mitral Valve: The mitral valve is normal in structure. Normal mobility of the mitral valve leaflets. No evidence of mitral valve regurgitation. No evidence of mitral valve stenosis. Tricuspid Valve: The tricuspid valve is normal in structure. Tricuspid valve regurgitation is not demonstrated. No evidence of tricuspid stenosis. Aortic Valve: The aortic valve is normal in structure. Aortic valve regurgitation is not visualized. No aortic stenosis is present. Pulmonic Valve: The pulmonic valve was normal in structure. Pulmonic valve regurgitation is trivial. No evidence of pulmonic stenosis. Aorta: The aortic root is normal in size and structure. Venous: The inferior vena cava is normal in size with greater than 50% respiratory variability, suggesting right atrial pressure of 3 mmHg. IAS/Shunts: No atrial level shunt detected by color flow Doppler.  LEFT VENTRICLE PLAX 2D LVIDd:         4.30 cm  Diastology LVIDs:         3.40 cm  LV e' lateral:   6.20 cm/s LV PW:         1.30 cm  LV E/e' lateral: 8.8 LV IVS:        1.30 cm  LV e' medial:    5.66 cm/s LVOT diam:     2.20 cm  LV E/e' medial:  9.6 LV SV:         55 LV SV Index:   28 LVOT Area:     3.80 cm  RIGHT VENTRICLE RV S prime:     11.10 cm/s TAPSE (M-mode): 1.6 cm LEFT ATRIUM             Index       RIGHT ATRIUM           Index LA diam:        3.90 cm 1.97 cm/m  RA Area:     10.20 cm LA Vol (A2C):   50.5 ml 25.51 ml/m RA Volume:   18.30 ml  9.24 ml/m LA Vol (A4C):   42.6 ml 21.52 ml/m LA Biplane Vol: 47.5 ml 23.99 ml/m  AORTIC VALVE LVOT Vmax:   86.80 cm/s LVOT Vmean:  56.900 cm/s LVOT VTI:    0.145 m  AORTA Ao Root diam: 2.90 cm MITRAL VALVE MV Area (PHT): 3.91 cm    SHUNTS MV Decel Time: 194 msec    Systemic VTI:   0.14 m MV E velocity: 54.40 cm/s  Systemic Diam: 2.20 cm MV A velocity: 57.00 cm/s MV E/A ratio:  0.95 Donato Schultz MD Electronically signed by Donato Schultz MD Signature Date/Time: 03/04/2020/12:19:41 PM    Final      Labs:   Basic Metabolic Panel: Recent Labs  Lab 03/03/20 1232 03/03/20 1238  NA 134* 135  K 4.6 4.3  CL 99 100  CO2 20*  --   GLUCOSE 316* 319*  BUN 8 11  CREATININE 0.85 0.70  CALCIUM 9.3  --    GFR CrCl cannot be calculated (Unknown  ideal weight.). Liver Function Tests: Recent Labs  Lab 03/03/20 1232  AST 22  ALT 17  ALKPHOS 94  BILITOT 0.8  PROT 7.6  ALBUMIN 3.9   No results for input(s): LIPASE, AMYLASE in the last 168 hours. No results for input(s): AMMONIA in the last 168 hours. Coagulation profile Recent Labs  Lab 03/03/20 1232  INR 1.0    CBC: Recent Labs  Lab 03/03/20 1232 03/03/20 1238  WBC 10.5  --   NEUTROABS 5.9  --   HGB 14.9 15.0  HCT 46.9* 44.0  MCV 88.5  --   PLT 198  --    Cardiac Enzymes: No results for input(s): CKTOTAL, CKMB, CKMBINDEX, TROPONINI in the last 168 hours. BNP: Invalid input(s): POCBNP CBG: Recent Labs  Lab 03/03/20 1612 03/03/20 2257 03/04/20 0608 03/04/20 1120  GLUCAP 229* 172* 168* 170*   D-Dimer No results for input(s): DDIMER in the last 72 hours. Hgb A1c Recent Labs    03/03/20 1803 03/04/20 0619  HGBA1C 10.5* 10.7*   Lipid Profile Recent Labs    03/04/20 0619  CHOL 258*  HDL 29*  LDLCALC 202*  TRIG 133  CHOLHDL 8.9   Thyroid function studies No results for input(s): TSH, T4TOTAL, T3FREE, THYROIDAB in the last 72 hours.  Invalid input(s): FREET3 Anemia work up No results for input(s): VITAMINB12, FOLATE, FERRITIN, TIBC, IRON, RETICCTPCT in the last 72 hours. Microbiology Recent Results (from the past 240 hour(s))  SARS Coronavirus 2 by RT PCR (hospital order, performed in [facility] hospital lab) Nasopharyngeal Nasopharyngeal Swab     Status: None   Collection Time:  03/03/20  1:05 PM   Specimen: Nasopharyngeal Swab  Result Value Ref Range Status   SARS Coronavirus 2 NEGATIVE NEGATIVE Final    Comment: (NOTE) SARS-CoV-2 target nucleic acids are NOT DETECTED. The SARS-CoV-2 RNA is generally detectable in upper and lower respiratory specimens during the acute phase of infection. The lowest concentration of SARS-CoV-2 viral copies this assay can detect is 250 copies / mL. A negative result does not preclude SARS-CoV-2 infection and should not be used as the sole basis for treatment or other patient management decisions.  A negative result may occur with improper specimen collection / handling, submission of specimen other than nasopharyngeal swab, presence of viral mutation(s) within the areas targeted by this assay, and inadequate number of viral copies (<250 copies / mL). A negative result must be combined with clinical observations, patient history, and epidemiological information. Fact Sheet for Patients:   BoilerBrush.com.cy Fact Sheet for Healthcare Providers: https://pope.com/ This test is not yet approved or cleared  by the [place] FDA and has been authorized for detection and/or diagnosis of SARS-CoV-2 by FDA under an Emergency Use Authorization (EUA).  This EUA will remain in effect (meaning this test can be used) for the duration of the COVID-19 declaration under Section 564(b)(1) of the Act, 21 U.S.C. section 360bbb-3(b)(1), unless the authorization is terminated or revoked sooner. Performed at [facility], [address], [place], [place] [address]      Discharge Instructions:   Discharge Instructions    Diet - low sodium heart healthy   Complete by: As directed    Increase activity slowly   Complete by: As directed      Allergies as of 03/04/2020   No Known Allergies     Medication List    STOP taking these medications   aspirin 325 MG tablet Replaced by:  aspirin EC 81 MG tablet  naproxen sodium 220 MG tablet Commonly known as: ALEVE     TAKE these medications   aspirin EC 81 MG tablet Take 1 tablet (81 mg total) by mouth daily. Replaces: aspirin 325 MG tablet   atorvastatin 80 MG tablet Commonly known as: LIPITOR Take 0.5 tablets (40 mg total) by mouth daily at 6 PM. What changed: medication strength   clopidogrel 75 MG tablet Commonly known as: Plavix Take 1 tablet (75 mg total) by mouth daily for 21 days.   glipiZIDE 5 MG tablet Commonly known as: Glucotrol Take 1 tablet (5 mg total) by mouth 2 (two) times daily before a meal.   hydrochlorothiazide 12.5 MG capsule Commonly known as: Microzide Take 1 capsule (12.5 mg total) by mouth daily.   lisinopril 20 MG tablet Commonly known as: ZESTRIL Take 1 tablet (20 mg total) by mouth daily. What changed:   medication strength  how much to take   metFORMIN 1000 MG tablet Commonly known as: GLUCOPHAGE Take 1 tablet (1,000 mg total) by mouth 2 (two) times daily.      Follow-up Information    [facility]. Schedule an appointment as soon as possible for a visit in 1 month(s).   Contact information: [address] [place], [address] [place] [address] [phone]           Time coordinating discharge: 35 minutes  Signed:  Lurene Shadow  [facility] 03/04/2020, 3:59 PM

## 2020-03-04 NOTE — Progress Notes (Signed)
  Echocardiogram 2D Echocardiogram has been performed.  Ebony Matthews 03/04/2020, 10:25 AM

## 2020-03-04 NOTE — Evaluation (Signed)
Physical Therapy Evaluation and Discharge Patient Details Name: Ebony Matthews MRN: [id number] DOB: 10/22/1980 Today's Date: 03/04/2020   History of Present Illness  Pt is a 39 y/o female admitted secondary to L arm weakness. Imaging revealed scattered R cerebral hemisphere infarcts. PMH includes CVA, DM, HTN, and cocain abuse.   Clinical Impression  Patient evaluated by Physical Therapy with no further acute PT needs identified. All education has been completed and the patient has no further questions. Pt overall independent with mobility tasks. Was able to perform DGI tasks without LOB. Pt reports boyfriend able to assist intermittently. See below for any follow-up Physical Therapy or equipment needs. PT is signing off. Thank you for this referral. If needs change, please re-consult.      Follow Up Recommendations No PT follow up    Equipment Recommendations  None recommended by PT    Recommendations for Other Services       Precautions / Restrictions Precautions Precautions: None Restrictions Weight Bearing Restrictions: No      Mobility  Bed Mobility               General bed mobility comments: Standing with staff upon entry   Transfers Overall transfer level: Independent                  Ambulation/Gait Ambulation/Gait assistance: Independent Gait Distance (Feet): 200 Feet Assistive device: None Gait Pattern/deviations: WFL(Within Functional Limits) Gait velocity: WFL    General Gait Details: Good gait speed. No LOB when performing DGI tasks.   Stairs            Wheelchair Mobility    Modified Rankin (Stroke Patients Only) Modified Rankin (Stroke Patients Only) Pre-Morbid Rankin Score: No symptoms Modified Rankin: No symptoms     Balance Overall balance assessment: Independent                               Standardized Balance Assessment Standardized Balance Assessment : Dynamic Gait Index   Dynamic Gait Index Level  Surface: Normal Change in Gait Speed: Normal Gait with Horizontal Head Turns: Normal Gait with Vertical Head Turns: Normal Gait and Pivot Turn: Normal Step Over Obstacle: Normal Step Around Obstacles: Normal       Pertinent Vitals/Pain Pain Assessment: No/denies pain    Home Living Family/patient expects to be discharged to:: Private residence Living Arrangements: Spouse/significant other Available Help at Discharge: Available PRN/intermittently Type of Home: Other(Comment)(motel ) Home Access: Level entry     Home Layout: One level Home Equipment: None      Prior Function Level of Independence: Independent         Comments: Working as  Journalist, newspaper   Dominant Hand: Right    Extremity/Trunk Assessment   Upper Extremity Assessment Upper Extremity Assessment: Defer to OT evaluation    Lower Extremity Assessment Lower Extremity Assessment: Overall WFL for tasks assessed    Cervical / Trunk Assessment Cervical / Trunk Assessment: Normal  Communication   Communication: No difficulties  Cognition Arousal/Alertness: Awake/alert Behavior During Therapy: WFL for tasks assessed/performed Overall Cognitive Status: Within Functional Limits for tasks assessed                                        General Comments General comments (skin integrity, edema, etc.): Educated about "BE FAST"  acronym in recognizing CVA symptoms.     Exercises     Assessment/Plan    PT Assessment Patent does not need any further PT services  PT Problem List         PT Treatment Interventions      PT Goals (Current goals can be found in the Care Plan section)  Acute Rehab PT Goals Patient Stated Goal: to go home PT Goal Formulation: All assessment and education complete, DC therapy Time For Goal Achievement: 03/04/20 Potential to Achieve Goals: Good    Frequency     Barriers to discharge        Co-evaluation                AM-PAC PT "6 Clicks" Mobility  Outcome Measure Help needed turning from your back to your side while in a flat bed without using bedrails?: None Help needed moving from lying on your back to sitting on the side of a flat bed without using bedrails?: None Help needed moving to and from a bed to a chair (including a wheelchair)?: None Help needed standing up from a chair using your arms (e.g., wheelchair or bedside chair)?: None Help needed to walk in hospital room?: None Help needed climbing 3-5 steps with a railing? : None 6 Click Score: 24    End of Session   Activity Tolerance: Patient tolerated treatment well Patient left: in bed;with call bell/phone within reach;with bed alarm set Nurse Communication: Mobility status PT Visit Diagnosis: Other abnormalities of gait and mobility (R26.89)    Time: 8786-7672 PT Time Calculation (min) (ACUTE ONLY): 10 min   Charges:   PT Evaluation $PT Eval Low Complexity: 1 Low          Lou Miner, DPT  Acute Rehabilitation Services  Pager: [phone] Office: [phone]   Rudean Hitt 03/04/2020, 1:15 PM

## 2020-03-04 NOTE — Evaluation (Signed)
Occupational Therapy Evaluation Patient Details Name: Ebony Matthews MRN: [id number] DOB: August 17, 1981 Today's Date: 03/04/2020    History of Present Illness Pt is a 39 y/o female admitted secondary to L arm weakness. Imaging revealed scattered R cerebral hemisphere infarcts. PMH includes CVA, DM, HTN, and cocain abuse.    Clinical Impression   Pt PTA: Pt living with s/o in motel. Pt reports working at [employer]. Pt currentlyhas no focal deficits. Pt independent with ADL and mobility in room and in hallway- no physical assist required. Pt simulating tub transfer without holding onto walls in/out. Pt with no numbness/tingling in LUE. Pt does not require continued OT skilled services. OT signing off.    Follow Up Recommendations  No OT follow up    Equipment Recommendations  None recommended by OT    Recommendations for Other Services       Precautions / Restrictions Precautions Precautions: None Restrictions Weight Bearing Restrictions: No      Mobility Bed Mobility Overal bed mobility: Modified Independent             General bed mobility comments: Standing with staff upon entry   Transfers Overall transfer level: Independent                    Balance Overall balance assessment: Independent                               Standardized Balance Assessment Standardized Balance Assessment : Dynamic Gait Index   Dynamic Gait Index Level Surface: Normal Change in Gait Speed: Normal Gait with Horizontal Head Turns: Normal Gait with Vertical Head Turns: Normal Gait and Pivot Turn: Normal Step Over Obstacle: Normal Step Around Obstacles: Normal     ADL either performed or assessed with clinical judgement   ADL Overall ADL's : Modified independent                                       General ADL Comments: No physical assist required; picking item from floor; practicin tub transfer with no assist.     Vision Baseline  Vision/History: Wears glasses Wears Glasses: At all times Patient Visual Report: No change from baseline Vision Assessment?: No apparent visual deficits     Perception     Praxis      Pertinent Vitals/Pain Pain Assessment: No/denies pain     Hand Dominance Right   Extremity/Trunk Assessment Upper Extremity Assessment Upper Extremity Assessment: Overall WFL for tasks assessed;LUE deficits/detail LUE Deficits / Details: WNLs. Pt with no sensation changes at this time; has resolved.   Lower Extremity Assessment Lower Extremity Assessment: Overall WFL for tasks assessed   Cervical / Trunk Assessment Cervical / Trunk Assessment: Normal   Communication Communication Communication: No difficulties   Cognition Arousal/Alertness: Awake/alert Behavior During Therapy: WFL for tasks assessed/performed Overall Cognitive Status: Within Functional Limits for tasks assessed                                     General Comments  Educated about "BE FAST" acronym in recognizing CVA symptoms.     Exercises     Shoulder Instructions      Home Living Family/patient expects to be discharged to:: Private residence Living Arrangements: Spouse/significant other Available Help at Discharge:  Available PRN/intermittently Type of Home: Other(Comment)(motel ) Home Access: Level entry     Home Layout: One level     Bathroom Shower/Tub: Tub/shower unit         Home Equipment: None          Prior Functioning/Environment Level of Independence: Independent        Comments: Working as  Programmer, systems Problem List: Decreased activity tolerance      OT Treatment/Interventions:      OT Goals(Current goals can be found in the care plan section) Acute Rehab OT Goals Patient Stated Goal: to go home  OT Frequency:     Barriers to D/C:            Co-evaluation              AM-PAC OT "6 Clicks" Daily Activity     Outcome Measure Help from another  person eating meals?: None Help from another person taking care of personal grooming?: None Help from another person toileting, which includes using toliet, bedpan, or urinal?: None Help from another person bathing (including washing, rinsing, drying)?: None Help from another person to put on and taking off regular upper body clothing?: None Help from another person to put on and taking off regular lower body clothing?: None 6 Click Score: 24   End of Session Equipment Utilized During Treatment: Gait belt Nurse Communication: Mobility status  Activity Tolerance: Patient tolerated treatment well Patient left: Other (comment)(up walking with PT)  OT Visit Diagnosis: Unsteadiness on feet (R26.81)                Time: 1950-9326 OT Time Calculation (min): 13 min Charges:  OT General Charges $OT Visit: 1 Visit OT Evaluation $OT Eval Moderate Complexity: 1 Mod  Flora Lipps, OTR/L Acute Rehabilitation Services Pager: [phone] Office: [phone]   Nevayah Faust C 03/04/2020, 4:33 PM

## 2020-04-15 ENCOUNTER — Observation Stay (HOSPITAL_COMMUNITY): Payer: Commercial Managed Care - PPO

## 2020-04-15 ENCOUNTER — Encounter (HOSPITAL_COMMUNITY): Payer: Self-pay

## 2020-04-15 ENCOUNTER — Observation Stay (HOSPITAL_COMMUNITY)
Admission: EM | Admit: 2020-04-15 | Discharge: 2020-04-16 | Disposition: A | Discharge status: Home / Self Care | Payer: Commercial Managed Care - PPO | Attending: Internal Medicine | Admitting: Internal Medicine

## 2020-04-15 ENCOUNTER — Emergency Department (HOSPITAL_COMMUNITY): Payer: Commercial Managed Care - PPO

## 2020-04-15 DIAGNOSIS — E089 Diabetes mellitus due to underlying condition without complications: Secondary | ICD-10-CM | POA: Diagnosis present

## 2020-04-15 DIAGNOSIS — I10 Essential (primary) hypertension: Secondary | ICD-10-CM | POA: Diagnosis not present

## 2020-04-15 DIAGNOSIS — I639 Cerebral infarction, unspecified: Principal | ICD-10-CM | POA: Insufficient documentation

## 2020-04-15 DIAGNOSIS — E1165 Type 2 diabetes mellitus with hyperglycemia: Secondary | ICD-10-CM | POA: Insufficient documentation

## 2020-04-15 DIAGNOSIS — Z720 Tobacco use: Secondary | ICD-10-CM | POA: Insufficient documentation

## 2020-04-15 DIAGNOSIS — Z7982 Long term (current) use of aspirin: Secondary | ICD-10-CM | POA: Diagnosis not present

## 2020-04-15 DIAGNOSIS — E785 Hyperlipidemia, unspecified: Secondary | ICD-10-CM | POA: Insufficient documentation

## 2020-04-15 DIAGNOSIS — Z20822 Contact with and (suspected) exposure to covid-19: Secondary | ICD-10-CM | POA: Diagnosis not present

## 2020-04-15 DIAGNOSIS — Z7984 Long term (current) use of oral hypoglycemic drugs: Secondary | ICD-10-CM | POA: Diagnosis not present

## 2020-04-15 DIAGNOSIS — F1490 Cocaine use, unspecified, uncomplicated: Secondary | ICD-10-CM

## 2020-04-15 DIAGNOSIS — Z79899 Other long term (current) drug therapy: Secondary | ICD-10-CM | POA: Insufficient documentation

## 2020-04-15 DIAGNOSIS — R531 Weakness: Secondary | ICD-10-CM | POA: Diagnosis present

## 2020-04-15 DIAGNOSIS — F1410 Cocaine abuse, uncomplicated: Secondary | ICD-10-CM | POA: Insufficient documentation

## 2020-04-15 LAB — COMPREHENSIVE METABOLIC PANEL
ALT: 20 U/L (ref 0–44)
AST: 24 U/L (ref 15–41)
Albumin: 4.4 g/dL (ref 3.5–5.0)
Alkaline Phosphatase: 117 U/L (ref 38–126)
Anion gap: 13 (ref 5–15)
BUN: 7 mg/dL (ref 6–20)
CO2: 22 mmol/L (ref 22–32)
Calcium: 9.7 mg/dL (ref 8.9–10.3)
Chloride: 100 mmol/L (ref 98–111)
Creatinine, Ser: 0.89 mg/dL (ref 0.44–1.00)
GFR calc Af Amer: >60 mL/min (ref >60)
GFR calc non Af Amer: >60 mL/min (ref >60)
Glucose, Bld: 378 mg/dL — ABNORMAL HIGH (ref 70–99)
Potassium: 3.7 mmol/L (ref 3.5–5.1)
Sodium: 135 mmol/L (ref 135–145)
Total Bilirubin: 0.8 mg/dL (ref 0.3–1.2)
Total Protein: 8.2 g/dL — ABNORMAL HIGH (ref 6.5–8.1)

## 2020-04-15 LAB — CBC
HCT: 43.2 % (ref 36.0–46.0)
Hemoglobin: 14.1 g/dL (ref 12.0–15.0)
MCH: 28.4 pg (ref 26.0–34.0)
MCHC: 32.6 g/dL (ref 30.0–36.0)
MCV: 87.1 fL (ref 80.0–100.0)
Platelets: 214 10*3/uL (ref 150–400)
RBC: 4.96 MIL/uL (ref 3.87–5.11)
RDW: 15.4 % (ref 11.5–15.5)
WBC: 8.6 10*3/uL (ref 4.0–10.5)
nRBC: 0 % (ref 0.0–0.2)

## 2020-04-15 LAB — DIFFERENTIAL
Abs Immature Granulocytes: 0.02 10*3/uL (ref 0.00–0.07)
Basophils Absolute: 0 10*3/uL (ref 0.0–0.1)
Basophils Relative: 1 %
Eosinophils Absolute: 0 10*3/uL (ref 0.0–0.5)
Eosinophils Relative: 1 %
Immature Granulocytes: 0 %
Lymphocytes Relative: 29 %
Lymphs Abs: 2.5 10*3/uL (ref 0.7–4.0)
Monocytes Absolute: 0.6 10*3/uL (ref 0.1–1.0)
Monocytes Relative: 7 %
Neutro Abs: 5.4 10*3/uL (ref 1.7–7.7)
Neutrophils Relative %: 62 %

## 2020-04-15 LAB — PROTIME-INR
INR: 1 (ref 0.8–1.2)
Prothrombin Time: 12.4 seconds (ref 11.4–15.2)

## 2020-04-15 LAB — CBG MONITORING, ED
Glucose-Capillary: 372 mg/dL — ABNORMAL HIGH (ref 70–99)
Glucose-Capillary: 199 mg/dL — ABNORMAL HIGH (ref 70–99)

## 2020-04-15 LAB — ETHANOL: Alcohol, Ethyl (B): <10 mg/dL (ref <10)

## 2020-04-15 LAB — APTT: aPTT: 32 seconds (ref 24–36)

## 2020-04-15 LAB — I-STAT BETA HCG BLOOD, ED (MC, WL, AP ONLY): I-stat hCG, quantitative: <5 m[IU]/mL (ref <5)

## 2020-04-15 MED ORDER — ACETAMINOPHEN 325 MG PO TABS: 650.0000 mg | ORAL_TABLET | ORAL | Status: DC | PRN

## 2020-04-15 MED ORDER — IOHEXOL 350 MG/ML SOLN
75.0000 mL | Freq: Once | INTRAVENOUS | Status: AC | PRN
  Administered 2020-04-15: 75 mL via INTRAVENOUS

## 2020-04-15 MED ORDER — ACETAMINOPHEN 650 MG RE SUPP: 650.0000 mg | RECTAL | Status: DC | PRN

## 2020-04-15 MED ORDER — LORAZEPAM 2 MG/ML IJ SOLN: 1.0000 mg | Freq: Once | INTRAMUSCULAR | Status: DC

## 2020-04-15 MED ORDER — INSULIN ASPART 100 UNIT/ML ~~LOC~~ SOLN
0.0000 [IU] | Freq: Three times a day (TID) | SUBCUTANEOUS | Status: DC
  Administered 2020-04-16: 5 [IU] via SUBCUTANEOUS
  Administered 2020-04-16: 3 [IU] via SUBCUTANEOUS

## 2020-04-15 MED ORDER — ACETAMINOPHEN 160 MG/5ML PO SOLN: 650.0000 mg | ORAL | Status: DC | PRN

## 2020-04-15 MED ORDER — ENOXAPARIN SODIUM 40 MG/0.4ML ~~LOC~~ SOLN
40.0000 mg | SUBCUTANEOUS | Status: DC
  Administered 2020-04-15: 40 mg via SUBCUTANEOUS
  Filled 2020-04-15: qty 0.4

## 2020-04-15 MED ORDER — ATORVASTATIN CALCIUM 40 MG PO TABS: 40.0000 mg | ORAL_TABLET | Freq: Every day | ORAL | Status: DC

## 2020-04-15 MED ORDER — HYDRALAZINE HCL 20 MG/ML IJ SOLN: 5.0000 mg | INTRAMUSCULAR | Status: DC | PRN

## 2020-04-15 MED ORDER — GADOBUTROL 1 MMOL/ML IV SOLN
8.0000 mL | Freq: Once | INTRAVENOUS | Status: AC | PRN
  Administered 2020-04-15: 8 mL via INTRAVENOUS

## 2020-04-15 MED ORDER — SODIUM CHLORIDE 0.9% FLUSH
3.0000 mL | Freq: Once | INTRAVENOUS | Status: DC
Start: 2020-04-15 — End: 2020-04-16

## 2020-04-15 MED ORDER — ASPIRIN 81 MG PO CHEW
324.0000 mg | CHEWABLE_TABLET | Freq: Once | ORAL | Status: AC
  Administered 2020-04-15: 324 mg via ORAL
  Filled 2020-04-15: qty 4

## 2020-04-15 MED ORDER — ASPIRIN EC 81 MG PO TBEC
81.0000 mg | DELAYED_RELEASE_TABLET | Freq: Every day | ORAL | Status: DC
  Administered 2020-04-16: 81 mg via ORAL
  Filled 2020-04-15: qty 1

## 2020-04-15 MED ORDER — STROKE: EARLY STAGES OF RECOVERY BOOK
Freq: Once | Status: AC
  Filled 2020-04-15: qty 1

## 2020-04-15 MED ORDER — INSULIN ASPART 100 UNIT/ML ~~LOC~~ SOLN: 0.0000 [IU] | Freq: Every day | SUBCUTANEOUS | Status: DC

## 2020-04-15 NOTE — H&P (Signed)
History and Physical    Ebony Matthews ZOX:[id number] DOB: 05/19/1981 DOA: 04/15/2020  PCP: Georgiann Cocker, NP  Patient coming from: Home  I have personally briefly reviewed patient's old medical records in [facility]  Chief Complaint: Left sided weakness  HPI: Ebony Matthews is a 39 y.o. female with medical history significant for multiple CVAs, HTN, Type 2 diabetes, cocaine abuse and med non-compliance who presented with left-sided upper extremity weakness.  Around 10:50 PM last night she began to notice weakness of her left arm with numbness and tingling.  Boyfriend also thought she had slurred speech but no facial asymmetry.  Since her last admission she has been compliant with all of her blood pressure and diabetes medication.  She continues to use cocaine and smokes cigarettes daily.  Denies alcohol use.  No chest pain or shortness of breath.  No palpitations.  Patient was just admitted last month with new multifocal right cerebellar stroke. CTA head and neck with no large vessel occlusion. Narrow cervical internal carotids that could be fibromuscular or atherosclerotic disease. Echo with EF of 60-65% with no cardioembolic source. She was not a candidate for loop recorder per neurology due to her non-compliance with medication.   In the ED, She was hypertension up to systolic 204.  CT head showed 1.6 x 1.3cm well-defined white matter in right frontal lobe MRI pending   Review of Systems:  Constitutional: No Weight Change, No Fever ENT/Mouth: No sore throat, No Rhinorrhea Eyes: No Eye Pain, No Vision Changes Cardiovascular: No Chest Pain, no SOB Respiratory: No Cough, No Sputum, Gastrointestinal: No Nausea, No Vomiting, No Diarrhea, No Constipation, No Pain Genitourinary: no Urinary Incontinence, No Urgency, No Flank Pain Musculoskeletal: No Arthralgias, No Myalgias Skin: No Skin Lesions, No Pruritus, Neuro: + Weakness, + Numbness,  No Loss of Consciousness, No  Syncope Psych: No Anxiety/Panic, No Depression, no decrease appetite Heme/Lymph: No Bruising, No Bleeding  Past Medical History:  Diagnosis Date   Diabetes mellitus without complication (HCC)    Hypertension    TIA (transient ischemic attack) 01/22/2019    Past Surgical History:  Procedure Laterality Date   NO PAST SURGERIES       reports that she has been smoking. She has a 15.00 pack-year smoking history. She has never used smokeless tobacco. She reports current drug use. Drug: Marijuana. She reports that she does not drink alcohol.  No Known Allergies  Family History  Problem Relation Age of Onset   Diabetes Other    Hypertension Maternal Grandmother      Prior to Admission medications   Medication Sig Start Date End Date Taking? Authorizing Provider  atorvastatin (LIPITOR) 80 MG tablet Take 0.5 tablets (40 mg total) by mouth daily at 6 PM. 03/04/20  Yes Lurene Shadow, MD  lisinopril (ZESTRIL) 20 MG tablet Take 1 tablet (20 mg total) by mouth daily. 03/04/20 04/15/20 Yes Lurene Shadow, MD  metFORMIN (GLUCOPHAGE) 1000 MG tablet Take 1 tablet (1,000 mg total) by mouth 2 (two) times daily. 03/04/20  Yes Lurene Shadow, MD  aspirin EC 81 MG tablet Take 1 tablet (81 mg total) by mouth daily. Patient not taking: Reported on 04/15/2020 03/04/20 03/04/21  Lurene Shadow, MD  glipiZIDE (GLUCOTROL) 5 MG tablet Take 1 tablet (5 mg total) by mouth 2 (two) times daily before a meal. Patient not taking: Reported on 04/15/2020 03/04/20   Lurene Shadow, MD  hydrochlorothiazide (MICROZIDE) 12.5 MG capsule Take 1 capsule (12.5 mg total) by mouth daily. Patient not taking: Reported  on 04/15/2020 03/04/20   Lurene Shadow, MD    Physical Exam: Vitals:   04/15/20 1708 04/15/20 1900  BP: (!) 188/103 (!) 204/106  Pulse: 87   Resp: 20 18  Temp: 98.6 F (37 C)   TempSrc: Oral   SpO2: 100%     Constitutional: NAD, calm, comfortable, female lying flat in bed Vitals:   04/15/20 1708 04/15/20 1900  BP:  (!) 188/103 (!) 204/106  Pulse: 87   Resp: 20 18  Temp: 98.6 F (37 C)   TempSrc: Oral   SpO2: 100%    Eyes: PERRL, lids and conjunctivae normal ENMT: Mucous membranes are moist.  Facial hirsutism. Neck: normal, supple Respiratory: clear to auscultation bilaterally, no wheezing, no crackles. Normal respiratory effort. No accessory muscle use.  Cardiovascular: Regular rate and rhythm, no murmurs / rubs / gallops. No extremity edema.  No carotid bruits.  Abdomen: no tenderness, no masses palpated. No hepatosplenomegaly. Bowel sounds positive.  Musculoskeletal: no clubbing / cyanosis. No joint deformity upper and lower extremities. Normal muscle tone.  Skin: no rashes, lesions, ulcers. No induration Neurologic: CN 2-12 grossly intact. Sensation intact.  Patient unable has 0 out of 5 strength of the left upper extremity compared to 5 out of 5 of the right.  5 out of 5 of the bilateral lower extremity.  Intact heel-to-shin.  Intact finger-nose 50 right Psychiatric: Normal judgment and insight. Alert and oriented x 3. Flat affect.  Labs on Admission: I have personally reviewed following labs and imaging studies  CBC: Recent Labs  Lab 04/15/20 1652  WBC 8.6  NEUTROABS 5.4  HGB 14.1  HCT 43.2  MCV 87.1  PLT 214   Basic Metabolic Panel: Recent Labs  Lab 04/15/20 1652  NA 135  K 3.7  CL 100  CO2 22  GLUCOSE 378*  BUN 7  CREATININE 0.89  CALCIUM 9.7   GFR: CrCl cannot be calculated (Unknown ideal weight.). Liver Function Tests: Recent Labs  Lab 04/15/20 1652  AST 24  ALT 20  ALKPHOS 117  BILITOT 0.8  PROT 8.2*  ALBUMIN 4.4   No results for input(s): LIPASE, AMYLASE in the last 168 hours. No results for input(s): AMMONIA in the last 168 hours. Coagulation Profile: Recent Labs  Lab 04/15/20 1652  INR 1.0   Cardiac Enzymes: No results for input(s): CKTOTAL, CKMB, CKMBINDEX, TROPONINI in the last 168 hours. BNP (last 3 results) No results for input(s): PROBNP in  the last 8760 hours. HbA1C: No results for input(s): HGBA1C in the last 72 hours. CBG: Recent Labs  Lab 04/15/20 1652  GLUCAP 372*   Lipid Profile: No results for input(s): CHOL, HDL, LDLCALC, TRIG, CHOLHDL, LDLDIRECT in the last 72 hours. Thyroid Function Tests: No results for input(s): TSH, T4TOTAL, FREET4, T3FREE, THYROIDAB in the last 72 hours. Anemia Panel: No results for input(s): VITAMINB12, FOLATE, FERRITIN, TIBC, IRON, RETICCTPCT in the last 72 hours. Urine analysis:    Component Value Date/Time   COLORURINE AMBER (A) 03/03/2020 1200   APPEARANCEUR CLOUDY (A) 03/03/2020 1200   LABSPEC 1.031 (H) 03/03/2020 1200   PHURINE 5.0 03/03/2020 1200   GLUCOSEU >=500 (A) 03/03/2020 1200   HGBUR NEGATIVE 03/03/2020 1200   BILIRUBINUR NEGATIVE 03/03/2020 1200   KETONESUR 20 (A) 03/03/2020 1200   PROTEINUR 100 (A) 03/03/2020 1200   UROBILINOGEN 0.2 02/22/2014 1730   NITRITE NEGATIVE 03/03/2020 1200   LEUKOCYTESUR NEGATIVE 03/03/2020 1200    Radiological Exams on Admission: CT HEAD WO CONTRAST  Result Date: 04/15/2020  CLINICAL DATA:  Focal neurological deficit. EXAM: CT HEAD WITHOUT CONTRAST TECHNIQUE: Contiguous axial images were obtained from the base of the skull through the vertex without intravenous contrast. COMPARISON:  MRI head, dated March 03, 2020. FINDINGS: Brain: No evidence of acute hemorrhage, hydrocephalus or extra-axial collection. A 1.6 cm x 1.3 cm well-defined area of white matter low attenuation is noted within the parasagittal region of the right frontal lobe. This is anterior to the frontal horn of the right lateral ventricle and represents a new finding when compared to the prior MRI of the head. A mild amount of mass effect is seen on the anterior aspect of the right lateral ventricle. No midline shift is seen. Vascular: No hyperdense vessel or unexpected calcification. Skull: Normal. Negative for fracture or focal lesion. Sinuses/Orbits: No acute finding. Other:  None. IMPRESSION: 1.6 cm x 1.3 cm well-defined area of white matter low attenuation within the parasagittal region of the right frontal lobe which represents a new finding when compared to the prior MRI of the head and may represent a subacute infarct. Correlation with follow-up MRI of the head is recommended. Electronically Signed   By: Aram Candela M.D.   On: 04/15/2020 19:33      Assessment/Plan  New right frontal lobe CVA MRI brain pending CTA head and neck to assess for vasospasm per neuro. Cocaine induced vasospasm high on differential UDS pending Labs in June: Lipid panel with LDL of 202 HbA1C of 10.7 PT/OT  Continue aspirin   HTN Permissive HTN  Type 2 diabetes HbA1C of 10.7 in June Moderate SSI  Cocaine/tobacco use  Discussed the importance of cessation with recurrent stroke   Status is: Observation  The patient remains OBS appropriate and will d/c before 2 midnights.  Dispo: The patient is from: Home              Anticipated d/c is to: Home              Anticipated d/c date is: 1 day              Patient currently is not medically stable to d/c.         Anselm Jungling DO [facility]   If 7PM-7AM, please contact night-coverage [email or web address]   04/15/2020, 8:19 PM

## 2020-04-15 NOTE — Consult Note (Signed)
Referring Physician: Dr. Dalene Seltzer    Chief Complaint: Left sided weakness  HPI: Ebony Matthews is an 39 y.o. female with a PMHx of ongoing cocaine abuse, medication noncompliance, HTN, DM, HLD and recent admission for stroke, who re-presents with new onset of LUE paralysis which began 2 days ago in the afternoon. She states that her left leg then became weak yesterday afternoon. She has fallen twice over the past 2 days. She has mild left knee pain. She denies any sensory numbness, vision changes, SOB or CP. Per her boyfriend, her speech has been slurred. CT head reveals a 1.6 cm x 1.3 cm well-defined area of white matter low attenuation within the parasagittal region of the right frontal lobe which represents a new finding when compared to the prior MRI of the head; it appears most consistent with a subacute small vessel ischemic infarction.   Prior admission: She was admitted in June for evaluation of left arm weakness and numbness. MRI at that time had revealed scattered multifocal subcentimeter acute ischemic infarcts involving the right cerebral hemisphere as well as a few additional subcentimeter infarcts involving the left occipital lobe and inferior right cerebellum. A small infarct involving the right middle cerebellar peduncle was chronic in appearance, but new as compared to the previous scan. Also noted was underlying mild to moderate chronic microvascular ischemic disease, mildly progressed from previous. Echocardiogram had revealed EF estimated at 60 to 65%, mild LVH, grade 1 diastolic dysfunction, with no evidence of intracardiac source of embolism. CTA head and neck did not show any evidence of large vessel occlusion.  Patient was seen in consultation by Dr. Pearlean Brownie. who noted that the patient was not a candidate for a loop recorder or TEE because of medical nonadherence. The patient had stated that she was not ready to quit using cocaine, marijuana and cigarettes.  LSN: 2 days ago tPA  Given: No: Out of time window  Past Medical History:  Diagnosis Date  . Diabetes mellitus without complication (HCC)   . Hypertension   . TIA (transient ischemic attack) 01/22/2019    Past Surgical History:  Procedure Laterality Date  . NO PAST SURGERIES      Family History  Problem Relation Age of Onset  . Diabetes Other   . Hypertension Maternal Grandmother    Social History:  reports that she has been smoking. She has a 15.00 pack-year smoking history. She has never used smokeless tobacco. She reports current drug use. Drug: Marijuana. She reports that she does not drink alcohol.  Allergies: No Known Allergies  Home Medications: (note, the patient endorses noncompliance) No current facility-administered medications on file prior to encounter.   Current Outpatient Medications on File Prior to Encounter  Medication Sig Dispense Refill  . atorvastatin (LIPITOR) 80 MG tablet Take 0.5 tablets (40 mg total) by mouth daily at 6 PM. 30 tablet 0  . lisinopril (ZESTRIL) 20 MG tablet Take 1 tablet (20 mg total) by mouth daily. 30 tablet 0  . metFORMIN (GLUCOPHAGE) 1000 MG tablet Take 1 tablet (1,000 mg total) by mouth 2 (two) times daily. 60 tablet 0  . aspirin EC 81 MG tablet Take 1 tablet (81 mg total) by mouth daily. (Patient not taking: Reported on 04/15/2020)    . glipiZIDE (GLUCOTROL) 5 MG tablet Take 1 tablet (5 mg total) by mouth 2 (two) times daily before a meal. (Patient not taking: Reported on 04/15/2020) 60 tablet 0  . hydrochlorothiazide (MICROZIDE) 12.5 MG capsule Take 1 capsule (12.5 mg total)  by mouth daily. (Patient not taking: Reported on 04/15/2020) 30 capsule 0     Inpatient Scheduled Medications: .  stroke: mapping our early stages of recovery book   Does not apply Once  . [START ON 04/16/2020] aspirin EC  81 mg Oral Daily  . [START ON 04/16/2020] atorvastatin  40 mg Oral q1800  . enoxaparin (LOVENOX) injection  40 mg Subcutaneous Q24H  . [START ON 04/16/2020] insulin  aspart  0-15 Units Subcutaneous TID WC  . insulin aspart  0-5 Units Subcutaneous QHS  . LORazepam  1 mg Intravenous Once  . sodium chloride flush  3 mL Intravenous Once    ROS: She does endorse using cocaine yesterday. Other ROS as per HPI. The patient does not endorse additional symptoms in the context of apparent reluctance to discuss her symptoms and medical history in detail.   Physical Examination: Blood pressure (!) 204/106, pulse 87, temperature 98.6 F (37 C), temperature source Oral, resp. rate 18, SpO2 100 %.  HEENT: [place]/AT. Alopecia is noted.  Lungs: Respirations unlabored Ext: No edema  Neurologic Examination: Mental Status:  Alert, fully oriented. Affect is dysthymic to flat. Decreased communicativeness that somewhat improves during the exam. Speech is fluent with intact comprehension and naming. No dysarthria. Left hemineglect. Cranial Nerves: II:  Visual fields intact with no extinction to DSS.  III,IV, VI: No ptosis. Right sided gaze preference, but can cross midline to the left with saccades and tracking. No nystagmus.  V,VII: Mild left facial droop. Facial temp sensation equal bilaterally VIII: hearing intact to voice IX,X: No hypophonia XI: Head is preferentially rotated to the right, but can turn to the left.  XII: Tongue deviates slightly to the left when protruded.  Motor: RUE and RLE: 5/5 proximally and distally LUE: 2/5 deltoid, 3/5 biceps/triceps, wrist extension/flexion 1-2/5, unable to grip or extend fingers LLE 4-/5 Sensory: Temp and light touch intact bilateral upper and lower extremities. No extinction to DSS.  Deep Tendon Reflexes:  1+ right biceps/brachioradialis Trace left biceps/brachioradialis 0 right patellar, trace left patellar 0 achilles bilaterally  Right toe downgoing, left toe mute Cerebellar: No ataxia with FNF on the right. Unable to perform on the left.  Gait: Deferred  Results for orders placed or performed during the hospital  encounter of 04/15/20 (from the past 48 hour(s))  Protime-INR     Status: None   Collection Time: 04/15/20  4:52 PM  Result Value Ref Range   Prothrombin Time 12.4 11.4 - 15.2 seconds   INR 1.0 0.8 - 1.2    Comment: (NOTE) INR goal varies based on device and disease states. Performed at [facility], [address], [place], [place] [address]   APTT     Status: None   Collection Time: 04/15/20  4:52 PM  Result Value Ref Range   aPTT 32 24 - 36 seconds    Comment: Performed at [facility], [address], [place], [place] [address]  CBC     Status: None   Collection Time: 04/15/20  4:52 PM  Result Value Ref Range   WBC 8.6 4.0 - 10.5 K/uL   RBC 4.96 3.87 - 5.11 MIL/uL   Hemoglobin 14.1 12.0 - 15.0 g/dL   HCT 93.8 36 - 46 %   MCV 87.1 80.0 - 100.0 fL   MCH 28.4 26.0 - 34.0 pg   MCHC 32.6 30.0 - 36.0 g/dL   RDW 10.1 75.1 - 02.5 %   Platelets 214 150 - 400  K/uL   nRBC 0.0 0.0 - 0.2 %    Comment: Performed at [facility], [address] [place], [place] [address]  Differential     Status: None   Collection Time: 04/15/20  4:52 PM  Result Value Ref Range   Neutrophils Relative % 62 %   Neutro Abs 5.4 1.7 - 7.7 K/uL   Lymphocytes Relative 29 %   Lymphs Abs 2.5 0.7 - 4.0 K/uL   Monocytes Relative 7 %   Monocytes Absolute 0.6 0 - 1 K/uL   Eosinophils Relative 1 %   Eosinophils Absolute 0.0 0 - 0 K/uL   Basophils Relative 1 %   Basophils Absolute 0.0 0 - 0 K/uL   Immature Granulocytes 0 %   Abs Immature Granulocytes 0.02 0.00 - 0.07 K/uL    Comment: Performed at [facility], [address], [place], [place] [address]  Comprehensive metabolic panel     Status: Abnormal   Collection Time: 04/15/20  4:52 PM  Result Value Ref Range   Sodium 135 135 - 145 mmol/L   Potassium 3.7 3.5 - 5.1 mmol/L   Chloride 100 98 - 111 mmol/L   CO2 22 22 - 32 mmol/L   Glucose, Bld 378 (H) 70 - 99 mg/dL    Comment: Glucose reference range applies only to samples taken  after fasting for at least 8 hours.   BUN 7 6 - 20 mg/dL   Creatinine, Ser 0.98 0.44 - 1.00 mg/dL   Calcium 9.7 8.9 - 11.9 mg/dL   Total Protein 8.2 (H) 6.5 - 8.1 g/dL   Albumin 4.4 3.5 - 5.0 g/dL   AST 24 15 - 41 U/L   ALT 20 0 - 44 U/L   Alkaline Phosphatase 117 38 - 126 U/L   Total Bilirubin 0.8 0.3 - 1.2 mg/dL   GFR calc non Af Amer >60 >60 mL/min   GFR calc Af Amer >60 >60 mL/min   Anion gap 13 5 - 15    Comment: Performed at [facility], [address] [place], [place] [address]  CBG monitoring, ED     Status: Abnormal   Collection Time: 04/15/20  4:52 PM  Result Value Ref Range   Glucose-Capillary 372 (H) 70 - 99 mg/dL    Comment: Glucose reference range applies only to samples taken after fasting for at least 8 hours.   Comment 1 Notify RN    Comment 2 Document in Chart   Ethanol     Status: None   Collection Time: 04/15/20  5:29 PM  Result Value Ref Range   Alcohol, Ethyl (B) <10 <10 mg/dL    Comment: (NOTE) Lowest detectable limit for serum alcohol is 10 mg/dL.  For medical purposes only. Performed at [facility], [address] [place], [place] [address]   I-Stat beta hCG blood, ED     Status: None   Collection Time: 04/15/20  5:30 PM  Result Value Ref Range   I-stat hCG, quantitative <5.0 <5 mIU/mL   Comment 3            Comment:   GEST. AGE      CONC.  (mIU/mL)   <=1 WEEK        5 - 50     2 WEEKS       50 - 500     3 WEEKS       100 - 10,000     4  WEEKS     1,000 - 30,000        FEMALE AND NON-PREGNANT FEMALE:     LESS THAN 5 mIU/mL    CT HEAD WO CONTRAST  Result Date: 04/15/2020 CLINICAL DATA:  Focal neurological deficit. EXAM: CT HEAD WITHOUT CONTRAST TECHNIQUE: Contiguous axial images were obtained from the base of the skull through the vertex without intravenous contrast. COMPARISON:  MRI head, dated March 03, 2020. FINDINGS: Brain: No evidence of acute hemorrhage, hydrocephalus or extra-axial collection. A 1.6 cm x 1.3 cm well-defined  area of white matter low attenuation is noted within the parasagittal region of the right frontal lobe. This is anterior to the frontal horn of the right lateral ventricle and represents a new finding when compared to the prior MRI of the head. A mild amount of mass effect is seen on the anterior aspect of the right lateral ventricle. No midline shift is seen. Vascular: No hyperdense vessel or unexpected calcification. Skull: Normal. Negative for fracture or focal lesion. Sinuses/Orbits: No acute finding. Other: None. IMPRESSION: 1.6 cm x 1.3 cm well-defined area of white matter low attenuation within the parasagittal region of the right frontal lobe which represents a new finding when compared to the prior MRI of the head and may represent a subacute infarct. Correlation with follow-up MRI of the head is recommended. Electronically Signed   By: Aram Candelahaddeus  Houston M.D.   On: 04/15/2020 19:33    Assessment: 39 y.o. female presenting with left arm paralysis.  1. CT head reveals a 1.6 cm x 1.3 cm well-defined area of white matter low attenuation within the parasagittal region of the right frontal lobe which represents a new finding when compared to the prior MRI of the head; it appears most consistent with a subacute small vessel ischemic infarction.  2. Substance abuse, including cocaine.  3. Cocaine use with vasospasm is high on the differential diagnosis regarding the etiology for her recurrent strokes. Due to the inflammation induced by cocaine in cases of RCVS (reversible cerebral vasoconstriction syndrome), a vasculitis work up would be unlikely to provide reliable information. Cocaine cessation is absolutely essential for this patient.  4. Stroke Risk Factors - Cocaine abuse, prior stroke, HTN, DM and medication noncompliance 5. TTE 03/04/20: Left ventricular ejection fraction, by estimation, is 60 to 65%. No intracardiac source of embolism detected.   Recommendations: 1. UDS is pending  2. MRI of the  brain with and without contrast (ordered).  3. CTA of head and neck to assess for possible vasospasm.  4. Drug cessation counseling. Cocaine use with vasospasm is high on the differential diagnosis regarding the etiology for her recurrent strokes. PT consult, OT consult, Speech consult 5. Continue ASA and atorvastatin (patient endorses medication noncompliance at home - these medications are listed as home meds in Epic).  6. Telemetry monitoring 7. Frequent neuro checks 8. PT/OT/Speech 9. HgbA1c, fasting lipid panel 10. BP management   @Electronically  signed: Dr. Caryl PinaEric Franko Hilliker@ 04/15/2020, 7:49 PM

## 2020-04-15 NOTE — ED Triage Notes (Signed)
Pt arrives to ED w/ c/o L arm paralysis. LKW 2200 last night. Pt AOx4, no loss of sensation, slurred speech, aphasia, vision changes, or drift in BLE.

## 2020-04-15 NOTE — ED Provider Notes (Signed)
[facility] EMERGENCY DEPARTMENT Provider Note   CSN: [id number] Arrival date & time: 04/15/20  1639     History Chief Complaint  Patient presents with  . Stroke Symptoms    Ebony Matthews is a 39 y.o. female.  HPI   39 year old female with a history of diabetes, hypertension, CVA, who presents to the emergency department today for strokelike symptoms. Pt c/o left arm weakness that started 2 days ago in the afternoon.  She further c/o left leg weakness that she states started yesterday afternoon. Denies any numbness to the arm or leg. Her boyfriend is at bed side and states that her speech is also slurred. Denies visual changes. Denies chest pain or SOB. States she fell twice over the last 2 days. C/o left knee pain that is mild. She has been able to walk on it.   Prior records reviewed.  Patient was admitted to the hospital last month for new CVA. Has h/o cocaine abuse and medication noncompliance.   Past Medical History:  Diagnosis Date  . Diabetes mellitus without complication (HCC)   . Hypertension   . TIA (transient ischemic attack) 01/22/2019    Patient Active Problem List   Diagnosis Date Noted  . Acute CVA (cerebrovascular accident) (HCC) 03/04/2020  . Stroke (cerebrum) (HCC) 03/03/2020  . Weakness   . Noncompliance with medication regimen   . TIA (transient ischemic attack) 01/22/2019  . Diabetes mellitus due to underlying condition without complications (HCC) 05/30/2014  . Essential hypertension, benign 05/30/2014  . Smoking 05/30/2014    Past Surgical History:  Procedure Laterality Date  . NO PAST SURGERIES       OB History    Gravida  1   Para      Term      Preterm      AB  1   Living        SAB  1   TAB      Ectopic      Multiple      Live Births              Family History  Problem Relation Age of Onset  . Diabetes Other   . Hypertension Maternal Grandmother     Social History   Tobacco Use  .  Smoking status: Current Every Day Smoker    Packs/day: 1.00    Years: 15.00    Pack years: 15.00  . Smokeless tobacco: Never Used  Vaping Use  . Vaping Use: Never used  Substance Use Topics  . Alcohol use: No  . Drug use: Yes    Types: Marijuana    Comment: occasional    Home Medications Prior to Admission medications   Medication Sig Start Date End Date Taking? Authorizing Provider  atorvastatin (LIPITOR) 80 MG tablet Take 0.5 tablets (40 mg total) by mouth daily at 6 PM. 03/04/20  Yes Lurene Shadow, MD  lisinopril (ZESTRIL) 20 MG tablet Take 1 tablet (20 mg total) by mouth daily. 03/04/20 04/15/20 Yes Lurene Shadow, MD  metFORMIN (GLUCOPHAGE) 1000 MG tablet Take 1 tablet (1,000 mg total) by mouth 2 (two) times daily. 03/04/20  Yes Lurene Shadow, MD  aspirin EC 81 MG tablet Take 1 tablet (81 mg total) by mouth daily. Patient not taking: Reported on 04/15/2020 03/04/20 03/04/21  Lurene Shadow, MD  glipiZIDE (GLUCOTROL) 5 MG tablet Take 1 tablet (5 mg total) by mouth 2 (two) times daily before a meal. Patient not taking: Reported on 04/15/2020 03/04/20  Lurene ShadowAyiku, Bernard, MD  hydrochlorothiazide (MICROZIDE) 12.5 MG capsule Take 1 capsule (12.5 mg total) by mouth daily. Patient not taking: Reported on 04/15/2020 03/04/20   Lurene ShadowAyiku, Bernard, MD    Allergies    Patient has no known allergies.  Review of Systems   Review of Systems  Constitutional: Negative for fever.  HENT: Negative for ear pain and sore throat.   Eyes: Negative for visual disturbance.  Respiratory: Negative for cough and shortness of breath.   Cardiovascular: Positive for palpitations. Negative for chest pain.  Gastrointestinal: Negative for abdominal pain, constipation, diarrhea, nausea and vomiting.  Genitourinary: Negative for dysuria and hematuria.  Musculoskeletal: Negative for back pain.       Left knee pain  Skin: Negative for rash.  Neurological: Positive for weakness. Negative for dizziness, facial asymmetry, speech  difficulty, light-headedness, numbness and headaches.  All other systems reviewed and are negative.   Physical Exam Updated Vital Signs BP (!) 204/106   Pulse 87   Temp 98.6 F (37 C) (Oral)   Resp 18   SpO2 100%   Physical Exam Vitals and nursing note reviewed.  Constitutional:      General: She is not in acute distress.    Appearance: She is well-developed.  HENT:     Head: Normocephalic and atraumatic.  Eyes:     Conjunctiva/sclera: Conjunctivae normal.  Cardiovascular:     Rate and Rhythm: Normal rate and regular rhythm.     Pulses: Normal pulses.     Heart sounds: Normal heart sounds. No murmur heard.   Pulmonary:     Effort: Pulmonary effort is normal. No respiratory distress.     Breath sounds: Normal breath sounds. No wheezing, rhonchi or rales.  Abdominal:     General: Bowel sounds are normal.     Palpations: Abdomen is soft.     Tenderness: There is no abdominal tenderness. There is no guarding or rebound.  Musculoskeletal:     Cervical back: Neck supple.  Skin:    General: Skin is warm and dry.  Neurological:     Mental Status: She is alert.     ED Results / Procedures / Treatments   Labs (all labs ordered are listed, but only abnormal results are displayed) Labs Reviewed  COMPREHENSIVE METABOLIC PANEL - Abnormal; Notable for the following components:      Result Value   Glucose, Bld 378 (*)    Total Protein 8.2 (*)    All other components within normal limits  CBG MONITORING, ED - Abnormal; Notable for the following components:   Glucose-Capillary 372 (*)    All other components within normal limits  SARS CORONAVIRUS 2 BY RT PCR (HOSPITAL ORDER, PERFORMED IN [facility])  PROTIME-INR  APTT  CBC  DIFFERENTIAL  ETHANOL  RAPID URINE DRUG SCREEN, HOSP PERFORMED  URINALYSIS, ROUTINE W REFLEX MICROSCOPIC  I-STAT BETA HCG BLOOD, ED ([facility], [facility], [facility] ONLY)    EKG EKG Interpretation  Date/Time:  Sunday April 15 2020 17:01:23  EDT Ventricular Rate:  93 PR Interval:  136 QRS Duration: 62 QT Interval:  336 QTC Calculation: 417 R Axis:   66 Text Interpretation: Normal sinus rhythm Septal infarct Since prior ECG, new ST changes inferior and lateral leads, however similar appearance since ECG 6/5 Confirmed by Alvira MondaySchlossman, Erin ([id number]) on 04/15/2020 5:10:21 PM   Radiology CT HEAD WO CONTRAST  Result Date: 04/15/2020 CLINICAL DATA:  Focal neurological deficit. EXAM: CT HEAD WITHOUT CONTRAST TECHNIQUE: Contiguous axial images were obtained  from the base of the skull through the vertex without intravenous contrast. COMPARISON:  MRI head, dated March 03, 2020. FINDINGS: Brain: No evidence of acute hemorrhage, hydrocephalus or extra-axial collection. A 1.6 cm x 1.3 cm well-defined area of white matter low attenuation is noted within the parasagittal region of the right frontal lobe. This is anterior to the frontal horn of the right lateral ventricle and represents a new finding when compared to the prior MRI of the head. A mild amount of mass effect is seen on the anterior aspect of the right lateral ventricle. No midline shift is seen. Vascular: No hyperdense vessel or unexpected calcification. Skull: Normal. Negative for fracture or focal lesion. Sinuses/Orbits: No acute finding. Other: None. IMPRESSION: 1.6 cm x 1.3 cm well-defined area of white matter low attenuation within the parasagittal region of the right frontal lobe which represents a new finding when compared to the prior MRI of the head and may represent a subacute infarct. Correlation with follow-up MRI of the head is recommended. Electronically Signed   By: Aram Candela M.D.   On: 04/15/2020 19:33    Procedures Procedures (including critical care time) CRITICAL CARE Performed by: Karrie Meres   Total critical care time: 32 minutes  Critical care time was exclusive of separately billable procedures and treating other patients.  Critical care was necessary  to treat or prevent imminent or life-threatening deterioration.  Critical care was time spent personally by me on the following activities: development of treatment plan with patient and/or surrogate as well as nursing, discussions with consultants, evaluation of patient's response to treatment, examination of patient, obtaining history from patient or surrogate, ordering and performing treatments and interventions, ordering and review of laboratory studies, ordering and review of radiographic studies, pulse oximetry and re-evaluation of patient's condition.   Medications Ordered in ED Medications  sodium chloride flush (NS) 0.9 % injection 3 mL (3 mLs Intravenous Not Given 04/15/20 1954)  aspirin chewable tablet 324 mg (324 mg Oral Given 04/15/20 1953)    ED Course  I have reviewed the triage vital signs and the nursing notes.  Pertinent labs & imaging results that were available during my care of the patient were reviewed by me and considered in my medical decision making (see chart for details).    MDM Rules/Calculators/A&P                          39 year old female presenting for evaluation of left-sided weakness that started 2 days ago.  Also complaining of some slurred speech per her boyfriend.  Has history of prior CVA and cocaine use.  On exam has left-sided weakness with the upper and lower extremity but does not have any sensory changes.  No facial droop or aphasia.  Stroke work-up initiated however patient does not meet criteria for TPA or LVO at this time.  CBC nonacute CMP with hyperglycemia, otherwise reassuring Coags negative EtOH negative Beta-hCG negative UA, UDS, Covid test pending at the time of admission  EKG Normal sinus rhythm Septal infarct Since prior ECG, new ST changes inferior and lateral leads, however similar appearance since ECG 6/5  CT head reviewed/interpreted - 1.6 cm x 1.3 cm well-defined area of white matter low attenuation within the parasagittal  region of the right frontal lobe which represents a new finding when compared to the prior MRI of the head and may represent a subacute infarct. Correlation with follow-up MRI of the head is recommended.  7:45 PM CONSULT with Dr. Otelia Limes who will see the patient.   8:00 PM CONSUL with Dr. Cyndia Bent who accepts patient for admission.   Final Clinical Impression(s) / ED Diagnoses Final diagnoses:  Cerebrovascular accident (CVA), unspecified mechanism [facility])    Rx / DC Orders ED Discharge Orders    None       Rayne Du 04/15/20 Malvin Johns, MD 04/16/20 2057

## 2020-04-16 ENCOUNTER — Other Ambulatory Visit: Payer: Self-pay

## 2020-04-16 DIAGNOSIS — I639 Cerebral infarction, unspecified: Secondary | ICD-10-CM | POA: Diagnosis not present

## 2020-04-16 DIAGNOSIS — F1490 Cocaine use, unspecified, uncomplicated: Secondary | ICD-10-CM | POA: Diagnosis not present

## 2020-04-16 DIAGNOSIS — E1165 Type 2 diabetes mellitus with hyperglycemia: Secondary | ICD-10-CM | POA: Diagnosis not present

## 2020-04-16 DIAGNOSIS — I10 Essential (primary) hypertension: Secondary | ICD-10-CM | POA: Diagnosis not present

## 2020-04-16 LAB — CBG MONITORING, ED
Glucose-Capillary: 231 mg/dL — ABNORMAL HIGH (ref 70–99)
Glucose-Capillary: 197 mg/dL — ABNORMAL HIGH (ref 70–99)

## 2020-04-16 LAB — SARS CORONAVIRUS 2 BY RT PCR (HOSPITAL ORDER, PERFORMED IN ~~LOC~~ HOSPITAL LAB): SARS Coronavirus 2: NEGATIVE

## 2020-04-16 MED ORDER — CLOPIDOGREL BISULFATE 75 MG PO TABS: 75.0000 mg | ORAL_TABLET | Freq: Every day | ORAL | 0 refills | Status: DC

## 2020-04-16 MED ORDER — CLOPIDOGREL BISULFATE 75 MG PO TABS
75.0000 mg | ORAL_TABLET | Freq: Every day | ORAL | Status: DC
  Administered 2020-04-16: 75 mg via ORAL
  Filled 2020-04-16: qty 1

## 2020-04-16 NOTE — ED Notes (Signed)
Patient walking in the hallway with her mother's assistant. Asked where they were going, patient states "we are going to stand outside." She is informed that she is not allowed to go and stand outside since she is admitted to the hospital.  -Patient has a cigarette in hand and mother has a cigarette and lighter in hand -They insist that they have to go outside. -after 5 minutes of discussion, patient agrees to go back to her room.  -education provided as patient is unsteady with walking needs a 1-person assist

## 2020-04-16 NOTE — Care Management (Signed)
ED CM met with patient concerning  TOc consult and PT recommendations for Outpatient PT.  Patient is agreeable, Referral sent to  [facility] on [address] at patient's request. Updated Greg RN on yellow.

## 2020-04-16 NOTE — ED Notes (Signed)
Pt ambulated to restroom without assistance.

## 2020-04-16 NOTE — Progress Notes (Signed)
Pt was seen and noted impulsive behavior, trying to walk into rooms of other people in ED.  Talked with her about safety, does not follow instructions well but instead requires contact assist for longer walks vs shorter trips where she can navigate better.  Recommend she get home therapy if available, and if not to get outpatient therapy to work on LUE weakness and LLE weakness, instability with longer trips.  Pt is not interested in therapy inpt, and will decline this if offered to go home.  Follow acutely as long as pt is here.  04/16/20 1500  PT Visit Information  Last PT Received On 04/16/20  Assistance Needed +1  History of Present Illness Pt is a 39 year old woman who came to the ED on 04/15/20 with L UE weakness, sensation changes and slurred speech. MRI + for multiple acute infarcts R cerebral hemispher and R pons. 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  Precautions  Precautions Fall  Precaution Comments impulsive  Restrictions  Weight Bearing Restrictions No  Home Living  Family/patient expects to be discharged to: Other (Comment) (motel)  Living Arrangements Spouse/significant other  Available Help at Discharge Available PRN/intermittently  Type of Home Other(Comment)  Home Access Level entry  Home Layout One level  Bathroom Shower/Tub Tub/shower unit  Bathroom Toilet Handicapped height  Home Equipment None  Prior Function  Level of Independence Independent  Comments has had LUE weakness before her arrival  Communication  Communication No difficulties  Pain Assessment  Pain Assessment No/denies pain  Cognition  Arousal/Alertness Awake/alert  Behavior During Therapy Flat affect  Overall Cognitive Status Difficult to assess  General Comments pt is trying to walk into rooms with PT escorting her on walking eval  Difficult to assess due to  (does not respond to PT direct requests consistently)  Upper Extremity  Assessment  Upper Extremity Assessment LUE deficits/detail  LUE Deficits / Details See OT eval  Lower Extremity Assessment  Lower Extremity Assessment LLE deficits/detail  LLE Deficits / Details 3/5 to 3+/5 strength on LLE, buckling wiht gait  LLE Coordination decreased gross motor  Cervical / Trunk Assessment  Cervical / Trunk Assessment Normal  Bed Mobility  Overal bed mobility Modified Independent  General bed mobility comments can get on and off bed  Transfers  Overall transfer level Needs assistance  Equipment used 1 person hand held assist  Transfers Sit to/from Stand  Sit to Stand Min guard  General transfer comment safety and IV lines  Ambulation/Gait  Ambulation/Gait assistance Min guard  Gait Distance (Feet) 200 Feet  Assistive device 1 person hand held assist  Gait Pattern/deviations Step-through pattern;Decreased stride length;Wide base of support;Decreased weight shift to left;Decreased stance time - left  General Gait Details has reduction in stability with LLE wiht longer walks  Gait velocity reduced  Gait velocity interpretation <1.8 ft/sec, indicate of risk for recurrent falls  Balance  Overall balance assessment Needs assistance  Sitting-balance support Feet supported  Sitting balance-Leahy Scale Good  Standing balance support Single extremity supported  Standing balance-Leahy Scale Fair  Standing balance comment supporting LUE to protect shoulder  General Comments  General comments (skin integrity, edema, etc.) pt was brought to ED with pre-exisiting LUE strength changes but is unsafe with no AD without S for longer trips  Exercises  Exercises Other exercises (3 to 3+ LLE strength)  PT - End of Session  Activity Tolerance Patient limited by fatigue;Treatment limited secondary  to medical complications (Comment)  Patient left in bed;with call bell/phone within reach  Nurse Communication Mobility status  PT Assessment  PT Recommendation/Assessment Patient  needs continued PT services  PT Visit Diagnosis Unsteadiness on feet (R26.81);Hemiplegia and hemiparesis;Difficulty in walking, not elsewhere classified (R26.2);Ataxic gait (R26.0);History of falling (Z91.81)  Hemiplegia - Right/Left Left  Hemiplegia - dominant/non-dominant Non-dominant  Hemiplegia - caused by Cerebral infarction  PT Problem List Decreased strength;Decreased range of motion;Decreased activity tolerance;Decreased balance;Decreased mobility;Decreased coordination;Decreased cognition;Decreased safety awareness;Decreased knowledge of precautions;Cardiopulmonary status limiting activity  Barriers to Discharge Comments home in level environment with her significant other  PT Plan  PT Frequency (ACUTE ONLY) Min 3X/week  PT Treatment/Interventions (ACUTE ONLY) Gait training;DME instruction;Functional mobility training;Therapeutic activities;Therapeutic exercise;Balance training;Neuromuscular re-education;Patient/family education  AM-PAC PT "6 Clicks" Mobility Outcome Measure (Version 2)  Help needed turning from your back to your side while in a flat bed without using bedrails? 3  Help needed moving from lying on your back to sitting on the side of a flat bed without using bedrails? 3  Help needed moving to and from a bed to a chair (including a wheelchair)? 3  Help needed standing up from a chair using your arms (e.g., wheelchair or bedside chair)? 3  Help needed to walk in hospital room? 3  Help needed climbing 3-5 steps with a railing?  2  6 Click Score 17  Consider Recommendation of Discharge To: Home with [facility]  PT Recommendation  Follow Up Recommendations Home health PT;Outpatient PT;Other (comment);Supervision for mobility/OOB (Pt is a candidate for therapy and may have to get outpatient)  PT equipment None recommended by PT (will recommend direct assistance from another initially)  Individuals Consulted  Consulted and Agree with Results and Recommendations Patient  Acute Rehab  PT Goals  Patient Stated Goal to go home  PT Goal Formulation With patient  Time For Goal Achievement 04/30/20  Potential to Achieve Goals Good  PT Time Calculation  PT Start Time (ACUTE ONLY) 1455  PT Stop Time (ACUTE ONLY) 1514  PT Time Calculation (min) (ACUTE ONLY) 19 min  PT General Charges  $$ ACUTE PT VISIT 1 Visit  PT Evaluation  $PT Eval Moderate Complexity 1 Mod  Written Expression  Dominant Hand Right    Samul Dada, PT MS Acute Rehab Dept. Number: [facility] [id number] and [facility] [phone]

## 2020-04-16 NOTE — ED Notes (Signed)
Lunch Trays Ordered @ 1101. 

## 2020-04-16 NOTE — Discharge Summary (Signed)
Physician Discharge Summary  Ebony Matthews UMP:[id number] DOB: 1981-06-29 DOA: 04/15/2020  PCP: Georgiann Cocker, NP  Admit date: 04/15/2020 Discharge date: 04/16/2020  Admitted From: Home Disposition:  Home   Recommendations for Outpatient Follow-up:  1. Follow up with PCP in 1-2 weeks 2. Please obtain BMP/CBC in one week 3. Please follow up on the following pending results:  Home Health: YES Equipment/Devices: HHPT  Discharge Condition: Stable CODE STATUS: FULL Diet recommendation: Heart Healthy / Carb Modified   Brief/Interim Summary: 39 y.o. female with medical history significant for multiple CVAs, HTN, Type 2 diabetes, cocaine abuse and med non-compliance who presented with left-sided upper extremity weakness.  Around 10:50 PM 04/14/20,t she began to notice weakness of her left arm with numbness and tingling.  Boyfriend also thought she had slurred speech but no facial asymmetry.  Since her last admission, she has not bee compliant with her ASA or plavix, nor any of her other medications for that matter. Patient was admitted in June 2021 when she was treated for a new multifocal right cerebellar stroke. The patient was instructed to discharge home with aspirin and Plavix dual therapy for 3 weeks, then continue aspirin. As discussed previously, the patient has been noncompliant with her medications.  Marland Kitchen CTA head and neck with no large vessel occlusion. Narrow cervical internal carotids that could be fibromuscular or atherosclerotic disease. Echo with EF of 60-65% with no cardioembolic source. She was not a candidate for loop recorder per neurology due to her non-compliance with medication.    In the ED, She was hypertension up to systolic 204.  CT head showed 1.6 x 1.3cm well-defined white matter in right frontal lobe  Repeat MRI of the brain on 04/15/2020 showed multiple new infarcts in the right cerebral hemisphere and a small acute infarct in the right pons. Neurology was  consulted. CTA head and neck showed acute proximal right ICA occlusion that was new from previous. There was distal reconstitution at the supraclinoid segment. The right MCA is perfused although attenuated as compared to the contralateral left. Otherwise, no other LVO or hemodynamically significant stenosis. The stroke team followed the patient. The case was discussed with Dr. Lubertha South on the morning of 04/16/2020. The patient was cleared for discharge to continue aspirin and Plavix. Compliance was discussed with the patient.  Discharge Diagnoses:  Acute right frontal lobe stroke -03/04/2020 hemoglobin A1c 10.7 -03/04/2020 LDL 202 -Patient has been noncompliant with her medications -Continue aspirin and Plavix x 21 days, then ASA monotherapy per neurology -set up HHPT and OT  Uncontrolled diabetes mellitus type 2 with hyperglycemia -The patient is a poor candidate for insulin therapy given her poor insight and lack of compliance -Restart lisinopril and Metformin  Essential hypertension -Restart lisinopril and HCTZ  Hyperlipidemia -Restart statin  Cocaine and tobacco abuse -cessation discussed  Noncompliance with medical therapy   Discharge Instructions   Allergies as of 04/16/2020   No Known Allergies     Medication List    TAKE these medications   aspirin EC 81 MG tablet Take 1 tablet (81 mg total) by mouth daily.   atorvastatin 80 MG tablet Commonly known as: LIPITOR Take 0.5 tablets (40 mg total) by mouth daily at 6 PM.   clopidogrel 75 MG tablet Commonly known as: PLAVIX Take 1 tablet (75 mg total) by mouth daily. X 21 days   glipiZIDE 5 MG tablet Commonly known as: Glucotrol Take 1 tablet (5 mg total) by mouth 2 (two) times daily before a meal.  hydrochlorothiazide 12.5 MG capsule Commonly known as: Microzide Take 1 capsule (12.5 mg total) by mouth daily.   lisinopril 20 MG tablet Commonly known as: ZESTRIL Take 1 tablet (20 mg total) by mouth daily.   metFORMIN  1000 MG tablet Commonly known as: GLUCOPHAGE Take 1 tablet (1,000 mg total) by mouth 2 (two) times daily.       No Known Allergies  Consultations:  neurology   Procedures/Studies: CT ANGIO HEAD W OR WO CONTRAST  Result Date: 04/15/2020 CLINICAL DATA:  Initial evaluation for acute stroke. EXAM: CT ANGIOGRAPHY HEAD AND NECK TECHNIQUE: Multidetector CT imaging of the head and neck was performed using the standard protocol during bolus administration of intravenous contrast. Multiplanar CT image reconstructions and MIPs were obtained to evaluate the vascular anatomy. Carotid stenosis measurements (when applicable) are obtained utilizing NASCET criteria, using the distal internal carotid diameter as the denominator. CONTRAST:  43mL OMNIPAQUE IOHEXOL 350 MG/ML SOLN COMPARISON:  Prior MRI from earlier the same day. FINDINGS: CTA NECK FINDINGS Aortic arch: Visualized aortic arch of normal caliber with normal 3 vessel morphology. No flow-limiting stenosis seen about the origin of the great vessels. Right carotid system: Right CCA patent from its origin to the bifurcation without stenosis. There is abrupt occlusion of the right ICA just distal to the bifurcation, new from previous. Right ICA remains occluded distally within the neck. Left carotid system: Left CCA patent from its origin to the bifurcation without stenosis. Short-segment 40% stenosis noted at the origin of the left ICA, stable. Left ICA otherwise widely patent distally. Focal tortuosity of the distal cervical left ICA just prior to the skull base noted, stable. Vertebral arteries: Both vertebral arteries arise from the subclavian arteries. No proximal subclavian artery stenosis. Vertebral arteries remain widely patent within the neck without stenosis, dissection or occlusion. Skeleton: No acute osseous finding. No discrete or worrisome osseous lesions. Other neck: No other acute soft tissue abnormality within the neck. Upper chest: Visualized  upper chest demonstrates no acute finding. 4 mm left upper lobe nodule noted (series 6, image 147), indeterminate. Review of the MIP images confirms the above findings CTA HEAD FINDINGS Anterior circulation: Petrous left ICA remains widely patent. Mild diffuse narrowing of the left cavernous segment noted, stable. Left ICA otherwise patent to the terminus. Right ICA occluded at the skull base, with distal reconstitution at the supraclinoid segment via collateral flow across the circle-of-Willis. A1 segments remain patent. Normal anterior communicating artery complex. Anterior cerebral arteries patent to their distal aspects without stenosis. No M1 stenosis or occlusion. Normal MCA bifurcations. Distal MCA branches well perfused, although the right MCA is somewhat attenuated as compared to the left related to the acute right ICA occlusion. Posterior circulation: Both vertebral arteries remain patent to the vertebrobasilar junction without stenosis. Patent left PICA. Right PICA not well seen. Basilar widely patent to its distal aspect without stenosis. Superior cerebral arteries patent bilaterally. Both PCAs primarily supplied via the basilar well perfused to their distal aspects. Small bilateral posterior communicating arteries noted. Venous sinuses: Grossly patent allowing for timing the contrast bolus. Anatomic variants: None significant. Review of the MIP images confirms the above findings IMPRESSION: 1. Acute proximal right ICA occlusion, new from previous. Distal reconstitution at the supraclinoid segment via collateral flow across the circle-of-Willis. Right MCA is perfused although attenuated as compared to the contralateral left. 2. Otherwise stable CTA of the head and neck. No other large vessel occlusion, hemodynamically significant stenosis, or other acute vascular abnormality. 3. 4 mm  left upper lobe nodule, indeterminate. No follow-up needed if patient is low-risk. Non-contrast chest CT can be considered  in 12 months if patient is high-risk. This recommendation follows the consensus statement: Guidelines for Management of Incidental Pulmonary Nodules Detected on CT Images: From the Fleischner Society 2017; Radiology 2017; 284:228-243. Electronically Signed   By: Rise Mu M.D.   On: 04/15/2020 22:52   CT HEAD WO CONTRAST  Result Date: 04/15/2020 CLINICAL DATA:  Focal neurological deficit. EXAM: CT HEAD WITHOUT CONTRAST TECHNIQUE: Contiguous axial images were obtained from the base of the skull through the vertex without intravenous contrast. COMPARISON:  MRI head, dated March 03, 2020. FINDINGS: Brain: No evidence of acute hemorrhage, hydrocephalus or extra-axial collection. A 1.6 cm x 1.3 cm well-defined area of white matter low attenuation is noted within the parasagittal region of the right frontal lobe. This is anterior to the frontal horn of the right lateral ventricle and represents a new finding when compared to the prior MRI of the head. A mild amount of mass effect is seen on the anterior aspect of the right lateral ventricle. No midline shift is seen. Vascular: No hyperdense vessel or unexpected calcification. Skull: Normal. Negative for fracture or focal lesion. Sinuses/Orbits: No acute finding. Other: None. IMPRESSION: 1.6 cm x 1.3 cm well-defined area of white matter low attenuation within the parasagittal region of the right frontal lobe which represents a new finding when compared to the prior MRI of the head and may represent a subacute infarct. Correlation with follow-up MRI of the head is recommended. Electronically Signed   By: Aram Candela M.D.   On: 04/15/2020 19:33   CT ANGIO NECK W OR WO CONTRAST  Result Date: 04/15/2020 CLINICAL DATA:  Initial evaluation for acute stroke. EXAM: CT ANGIOGRAPHY HEAD AND NECK TECHNIQUE: Multidetector CT imaging of the head and neck was performed using the standard protocol during bolus administration of intravenous contrast. Multiplanar CT  image reconstructions and MIPs were obtained to evaluate the vascular anatomy. Carotid stenosis measurements (when applicable) are obtained utilizing NASCET criteria, using the distal internal carotid diameter as the denominator. CONTRAST:  75mL OMNIPAQUE IOHEXOL 350 MG/ML SOLN COMPARISON:  Prior MRI from earlier the same day. FINDINGS: CTA NECK FINDINGS Aortic arch: Visualized aortic arch of normal caliber with normal 3 vessel morphology. No flow-limiting stenosis seen about the origin of the great vessels. Right carotid system: Right CCA patent from its origin to the bifurcation without stenosis. There is abrupt occlusion of the right ICA just distal to the bifurcation, new from previous. Right ICA remains occluded distally within the neck. Left carotid system: Left CCA patent from its origin to the bifurcation without stenosis. Short-segment 40% stenosis noted at the origin of the left ICA, stable. Left ICA otherwise widely patent distally. Focal tortuosity of the distal cervical left ICA just prior to the skull base noted, stable. Vertebral arteries: Both vertebral arteries arise from the subclavian arteries. No proximal subclavian artery stenosis. Vertebral arteries remain widely patent within the neck without stenosis, dissection or occlusion. Skeleton: No acute osseous finding. No discrete or worrisome osseous lesions. Other neck: No other acute soft tissue abnormality within the neck. Upper chest: Visualized upper chest demonstrates no acute finding. 4 mm left upper lobe nodule noted (series 6, image 147), indeterminate. Review of the MIP images confirms the above findings CTA HEAD FINDINGS Anterior circulation: Petrous left ICA remains widely patent. Mild diffuse narrowing of the left cavernous segment noted, stable. Left ICA otherwise patent to the  terminus. Right ICA occluded at the skull base, with distal reconstitution at the supraclinoid segment via collateral flow across the circle-of-Willis. A1  segments remain patent. Normal anterior communicating artery complex. Anterior cerebral arteries patent to their distal aspects without stenosis. No M1 stenosis or occlusion. Normal MCA bifurcations. Distal MCA branches well perfused, although the right MCA is somewhat attenuated as compared to the left related to the acute right ICA occlusion. Posterior circulation: Both vertebral arteries remain patent to the vertebrobasilar junction without stenosis. Patent left PICA. Right PICA not well seen. Basilar widely patent to its distal aspect without stenosis. Superior cerebral arteries patent bilaterally. Both PCAs primarily supplied via the basilar well perfused to their distal aspects. Small bilateral posterior communicating arteries noted. Venous sinuses: Grossly patent allowing for timing the contrast bolus. Anatomic variants: None significant. Review of the MIP images confirms the above findings IMPRESSION: 1. Acute proximal right ICA occlusion, new from previous. Distal reconstitution at the supraclinoid segment via collateral flow across the circle-of-Willis. Right MCA is perfused although attenuated as compared to the contralateral left. 2. Otherwise stable CTA of the head and neck. No other large vessel occlusion, hemodynamically significant stenosis, or other acute vascular abnormality. 3. 4 mm left upper lobe nodule, indeterminate. No follow-up needed if patient is low-risk. Non-contrast chest CT can be considered in 12 months if patient is high-risk. This recommendation follows the consensus statement: Guidelines for Management of Incidental Pulmonary Nodules Detected on CT Images: From the Fleischner Society 2017; Radiology 2017; 284:228-243. Electronically Signed   By: Rise Mu M.D.   On: 04/15/2020 22:52   MR BRAIN W WO CONTRAST  Result Date: 04/15/2020 CLINICAL DATA:  Left arm paralysis EXAM: MRI HEAD WITHOUT AND WITH CONTRAST TECHNIQUE: Multiplanar, multiecho pulse sequences of the  brain and surrounding structures were obtained without and with intravenous contrast. CONTRAST:  8mL GADAVIST GADOBUTROL 1 MMOL/ML IV SOLN COMPARISON:  03/03/2020 FINDINGS: Brain: There are multiple new foci of restricted diffusion in the right cerebral hemisphere with involvement frontal, parietal, and temporal lobes. The largest area of involvement is along the cingulate gyrus and genu of the corpus callosum. Additionally, there is a focus of restricted diffusion in the parasagittal right pons. There is expected evolution of the numerous small infarcts on the prior study including minimal enhancement associated with some. No evidence of hemorrhage. There is no intracranial mass or mass effect. Additional patchy foci of T2 hyperintensity in the supratentorial white matter nonspecific but probably reflects stable chronic microvascular ischemic changes. There is a chronic infarct involving the right brachium pontis. There is no hydrocephalus or extra-axial fluid collection. Vascular: Major vessel flow voids at the skull base are preserved. Skull and upper cervical spine: Normal marrow signal is preserved. Sinuses/Orbits: Paranasal sinuses are aerated. Orbits are unremarkable. Other: Sella is unremarkable.  Mastoid air cells are clear. IMPRESSION: Multiple new acute infarcts in the right cerebral hemisphere. Small acute infarct of the right pons. Expected evolution of infarcts on the prior study. Electronically Signed   By: Guadlupe Spanish M.D.   On: 04/15/2020 21:53        Discharge Exam: Vitals:   04/16/20 1029 04/16/20 1059  BP: (!) 173/104 (!) 188/104  Pulse:  76  Resp: (!) 22   Temp:    SpO2:  93%   Vitals:   04/16/20 0912 04/16/20 1012 04/16/20 1029 04/16/20 1059  BP:  (!) 185/98 (!) 173/104 (!) 188/104  Pulse: 61 75  76  Resp: (!) 7 (!) 22 (!)  22   Temp:  98 F (36.7 C)    TempSrc:  Oral    SpO2: 99% 98%  93%    General: Pt is alert, awake, not in acute distress Cardiovascular: RRR,  S1/S2 +, no rubs, no gallops Respiratory: bibasilar crackles. No weheze Abdominal: Soft, NT, ND, bowel sounds + Extremities: no edema, no cyanosis   The results of significant diagnostics from this hospitalization (including imaging, microbiology, ancillary and laboratory) are listed below for reference.    Significant Diagnostic Studies: CT ANGIO HEAD W OR WO CONTRAST  Result Date: 04/15/2020 CLINICAL DATA:  Initial evaluation for acute stroke. EXAM: CT ANGIOGRAPHY HEAD AND NECK TECHNIQUE: Multidetector CT imaging of the head and neck was performed using the standard protocol during bolus administration of intravenous contrast. Multiplanar CT image reconstructions and MIPs were obtained to evaluate the vascular anatomy. Carotid stenosis measurements (when applicable) are obtained utilizing NASCET criteria, using the distal internal carotid diameter as the denominator. CONTRAST:  75mL OMNIPAQUE IOHEXOL 350 MG/ML SOLN COMPARISON:  Prior MRI from earlier the same day. FINDINGS: CTA NECK FINDINGS Aortic arch: Visualized aortic arch of normal caliber with normal 3 vessel morphology. No flow-limiting stenosis seen about the origin of the great vessels. Right carotid system: Right CCA patent from its origin to the bifurcation without stenosis. There is abrupt occlusion of the right ICA just distal to the bifurcation, new from previous. Right ICA remains occluded distally within the neck. Left carotid system: Left CCA patent from its origin to the bifurcation without stenosis. Short-segment 40% stenosis noted at the origin of the left ICA, stable. Left ICA otherwise widely patent distally. Focal tortuosity of the distal cervical left ICA just prior to the skull base noted, stable. Vertebral arteries: Both vertebral arteries arise from the subclavian arteries. No proximal subclavian artery stenosis. Vertebral arteries remain widely patent within the neck without stenosis, dissection or occlusion. Skeleton: No  acute osseous finding. No discrete or worrisome osseous lesions. Other neck: No other acute soft tissue abnormality within the neck. Upper chest: Visualized upper chest demonstrates no acute finding. 4 mm left upper lobe nodule noted (series 6, image 147), indeterminate. Review of the MIP images confirms the above findings CTA HEAD FINDINGS Anterior circulation: Petrous left ICA remains widely patent. Mild diffuse narrowing of the left cavernous segment noted, stable. Left ICA otherwise patent to the terminus. Right ICA occluded at the skull base, with distal reconstitution at the supraclinoid segment via collateral flow across the circle-of-Willis. A1 segments remain patent. Normal anterior communicating artery complex. Anterior cerebral arteries patent to their distal aspects without stenosis. No M1 stenosis or occlusion. Normal MCA bifurcations. Distal MCA branches well perfused, although the right MCA is somewhat attenuated as compared to the left related to the acute right ICA occlusion. Posterior circulation: Both vertebral arteries remain patent to the vertebrobasilar junction without stenosis. Patent left PICA. Right PICA not well seen. Basilar widely patent to its distal aspect without stenosis. Superior cerebral arteries patent bilaterally. Both PCAs primarily supplied via the basilar well perfused to their distal aspects. Small bilateral posterior communicating arteries noted. Venous sinuses: Grossly patent allowing for timing the contrast bolus. Anatomic variants: None significant. Review of the MIP images confirms the above findings IMPRESSION: 1. Acute proximal right ICA occlusion, new from previous. Distal reconstitution at the supraclinoid segment via collateral flow across the circle-of-Willis. Right MCA is perfused although attenuated as compared to the contralateral left. 2. Otherwise stable CTA of the head and neck. No other large  vessel occlusion, hemodynamically significant stenosis, or other  acute vascular abnormality. 3. 4 mm left upper lobe nodule, indeterminate. No follow-up needed if patient is low-risk. Non-contrast chest CT can be considered in 12 months if patient is high-risk. This recommendation follows the consensus statement: Guidelines for Management of Incidental Pulmonary Nodules Detected on CT Images: From the Fleischner Society 2017; Radiology 2017; 284:228-243. Electronically Signed   By: Rise MuBenjamin  McClintock M.D.   On: 04/15/2020 22:52   CT HEAD WO CONTRAST  Result Date: 04/15/2020 CLINICAL DATA:  Focal neurological deficit. EXAM: CT HEAD WITHOUT CONTRAST TECHNIQUE: Contiguous axial images were obtained from the base of the skull through the vertex without intravenous contrast. COMPARISON:  MRI head, dated March 03, 2020. FINDINGS: Brain: No evidence of acute hemorrhage, hydrocephalus or extra-axial collection. A 1.6 cm x 1.3 cm well-defined area of white matter low attenuation is noted within the parasagittal region of the right frontal lobe. This is anterior to the frontal horn of the right lateral ventricle and represents a new finding when compared to the prior MRI of the head. A mild amount of mass effect is seen on the anterior aspect of the right lateral ventricle. No midline shift is seen. Vascular: No hyperdense vessel or unexpected calcification. Skull: Normal. Negative for fracture or focal lesion. Sinuses/Orbits: No acute finding. Other: None. IMPRESSION: 1.6 cm x 1.3 cm well-defined area of white matter low attenuation within the parasagittal region of the right frontal lobe which represents a new finding when compared to the prior MRI of the head and may represent a subacute infarct. Correlation with follow-up MRI of the head is recommended. Electronically Signed   By: Aram Candelahaddeus  Houston M.D.   On: 04/15/2020 19:33   CT ANGIO NECK W OR WO CONTRAST  Result Date: 04/15/2020 CLINICAL DATA:  Initial evaluation for acute stroke. EXAM: CT ANGIOGRAPHY HEAD AND NECK  TECHNIQUE: Multidetector CT imaging of the head and neck was performed using the standard protocol during bolus administration of intravenous contrast. Multiplanar CT image reconstructions and MIPs were obtained to evaluate the vascular anatomy. Carotid stenosis measurements (when applicable) are obtained utilizing NASCET criteria, using the distal internal carotid diameter as the denominator. CONTRAST:  75mL OMNIPAQUE IOHEXOL 350 MG/ML SOLN COMPARISON:  Prior MRI from earlier the same day. FINDINGS: CTA NECK FINDINGS Aortic arch: Visualized aortic arch of normal caliber with normal 3 vessel morphology. No flow-limiting stenosis seen about the origin of the great vessels. Right carotid system: Right CCA patent from its origin to the bifurcation without stenosis. There is abrupt occlusion of the right ICA just distal to the bifurcation, new from previous. Right ICA remains occluded distally within the neck. Left carotid system: Left CCA patent from its origin to the bifurcation without stenosis. Short-segment 40% stenosis noted at the origin of the left ICA, stable. Left ICA otherwise widely patent distally. Focal tortuosity of the distal cervical left ICA just prior to the skull base noted, stable. Vertebral arteries: Both vertebral arteries arise from the subclavian arteries. No proximal subclavian artery stenosis. Vertebral arteries remain widely patent within the neck without stenosis, dissection or occlusion. Skeleton: No acute osseous finding. No discrete or worrisome osseous lesions. Other neck: No other acute soft tissue abnormality within the neck. Upper chest: Visualized upper chest demonstrates no acute finding. 4 mm left upper lobe nodule noted (series 6, image 147), indeterminate. Review of the MIP images confirms the above findings CTA HEAD FINDINGS Anterior circulation: Petrous left ICA remains widely patent. Mild diffuse narrowing  of the left cavernous segment noted, stable. Left ICA otherwise patent  to the terminus. Right ICA occluded at the skull base, with distal reconstitution at the supraclinoid segment via collateral flow across the circle-of-Willis. A1 segments remain patent. Normal anterior communicating artery complex. Anterior cerebral arteries patent to their distal aspects without stenosis. No M1 stenosis or occlusion. Normal MCA bifurcations. Distal MCA branches well perfused, although the right MCA is somewhat attenuated as compared to the left related to the acute right ICA occlusion. Posterior circulation: Both vertebral arteries remain patent to the vertebrobasilar junction without stenosis. Patent left PICA. Right PICA not well seen. Basilar widely patent to its distal aspect without stenosis. Superior cerebral arteries patent bilaterally. Both PCAs primarily supplied via the basilar well perfused to their distal aspects. Small bilateral posterior communicating arteries noted. Venous sinuses: Grossly patent allowing for timing the contrast bolus. Anatomic variants: None significant. Review of the MIP images confirms the above findings IMPRESSION: 1. Acute proximal right ICA occlusion, new from previous. Distal reconstitution at the supraclinoid segment via collateral flow across the circle-of-Willis. Right MCA is perfused although attenuated as compared to the contralateral left. 2. Otherwise stable CTA of the head and neck. No other large vessel occlusion, hemodynamically significant stenosis, or other acute vascular abnormality. 3. 4 mm left upper lobe nodule, indeterminate. No follow-up needed if patient is low-risk. Non-contrast chest CT can be considered in 12 months if patient is high-risk. This recommendation follows the consensus statement: Guidelines for Management of Incidental Pulmonary Nodules Detected on CT Images: From the Fleischner Society 2017; Radiology 2017; 284:228-243. Electronically Signed   By: Rise Mu M.D.   On: 04/15/2020 22:52   MR BRAIN W WO  CONTRAST  Result Date: 04/15/2020 CLINICAL DATA:  Left arm paralysis EXAM: MRI HEAD WITHOUT AND WITH CONTRAST TECHNIQUE: Multiplanar, multiecho pulse sequences of the brain and surrounding structures were obtained without and with intravenous contrast. CONTRAST:  8mL GADAVIST GADOBUTROL 1 MMOL/ML IV SOLN COMPARISON:  03/03/2020 FINDINGS: Brain: There are multiple new foci of restricted diffusion in the right cerebral hemisphere with involvement frontal, parietal, and temporal lobes. The largest area of involvement is along the cingulate gyrus and genu of the corpus callosum. Additionally, there is a focus of restricted diffusion in the parasagittal right pons. There is expected evolution of the numerous small infarcts on the prior study including minimal enhancement associated with some. No evidence of hemorrhage. There is no intracranial mass or mass effect. Additional patchy foci of T2 hyperintensity in the supratentorial white matter nonspecific but probably reflects stable chronic microvascular ischemic changes. There is a chronic infarct involving the right brachium pontis. There is no hydrocephalus or extra-axial fluid collection. Vascular: Major vessel flow voids at the skull base are preserved. Skull and upper cervical spine: Normal marrow signal is preserved. Sinuses/Orbits: Paranasal sinuses are aerated. Orbits are unremarkable. Other: Sella is unremarkable.  Mastoid air cells are clear. IMPRESSION: Multiple new acute infarcts in the right cerebral hemisphere. Small acute infarct of the right pons. Expected evolution of infarcts on the prior study. Electronically Signed   By: Guadlupe Spanish M.D.   On: 04/15/2020 21:53     Microbiology: Recent Results (from the past 240 hour(s))  SARS Coronavirus 2 by RT PCR (hospital order, performed in [facility] hospital lab) Nasopharyngeal Nasopharyngeal Swab     Status: None   Collection Time: 04/15/20 11:17 PM   Specimen: Nasopharyngeal Swab  Result Value  Ref Range Status   SARS Coronavirus 2 NEGATIVE  NEGATIVE Final    Comment: (NOTE) SARS-CoV-2 target nucleic acids are NOT DETECTED.  The SARS-CoV-2 RNA is generally detectable in upper and lower respiratory specimens during the acute phase of infection. The lowest concentration of SARS-CoV-2 viral copies this assay can detect is 250 copies / mL. A negative result does not preclude SARS-CoV-2 infection and should not be used as the sole basis for treatment or other patient management decisions.  A negative result may occur with improper specimen collection / handling, submission of specimen other than nasopharyngeal swab, presence of viral mutation(s) within the areas targeted by this assay, and inadequate number of viral copies (<250 copies / mL). A negative result must be combined with clinical observations, patient history, and epidemiological information.  Fact Sheet for Patients:   BoilerBrush.com.cy  Fact Sheet for Healthcare Providers: https://pope.com/  This test is not yet approved or  cleared by the [place] FDA and has been authorized for detection and/or diagnosis of SARS-CoV-2 by FDA under an Emergency Use Authorization (EUA).  This EUA will remain in effect (meaning this test can be used) for the duration of the COVID-19 declaration under Section 564(b)(1) of the Act, 21 U.S.C. section 360bbb-3(b)(1), unless the authorization is terminated or revoked sooner.  Performed at [facility], [address], [place], [place] [address]      Labs: Basic Metabolic Panel: Recent Labs  Lab 04/15/20 1652  NA 135  K 3.7  CL 100  CO2 22  GLUCOSE 378*  BUN 7  CREATININE 0.89  CALCIUM 9.7   Liver Function Tests: Recent Labs  Lab 04/15/20 1652  AST 24  ALT 20  ALKPHOS 117  BILITOT 0.8  PROT 8.2*  ALBUMIN 4.4   No results for input(s): LIPASE, AMYLASE in the last 168 hours. No results for input(s):  AMMONIA in the last 168 hours. CBC: Recent Labs  Lab 04/15/20 1652  WBC 8.6  NEUTROABS 5.4  HGB 14.1  HCT 43.2  MCV 87.1  PLT 214   Cardiac Enzymes: No results for input(s): CKTOTAL, CKMB, CKMBINDEX, TROPONINI in the last 168 hours. BNP: Invalid input(s): POCBNP CBG: Recent Labs  Lab 04/15/20 1652 04/15/20 2257 04/16/20 0717  GLUCAP 372* 199* 231*    Time coordinating discharge:  36 minutes  Signed:  Catarina Hartshorn, DO [facility] Pager: [phone] 04/16/2020, 12:13 PM

## 2020-04-16 NOTE — ED Notes (Signed)
Tele

## 2020-04-16 NOTE — Evaluation (Signed)
Occupational Therapy Evaluation Patient Details Name: Ebony Matthews MRN: [id number] DOB: [birth year] Today's Date: 04/16/2020    History of Present Illness Pt is a 39 year old woman who came to the ED on 04/15/20 with L UE weakness, sensation changes and slurred speech. MRI + for multiple acute infarcts R cerebral hemisphere and R pons. 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   Clinical Impression   Pt guarded with answers to PLOF questions, but reports being independent in ADL and mobility. Pt had L UE weakness prior to this admission, but unclear to what extent. Pt presents with unsteadiness with ambulation and significant L UE weakness without functional use. She requires up to min assist for ADL. Will follow acutely, recommending HHOT.    Follow Up Recommendations  Home health OT    Equipment Recommendations  None recommended by OT    Recommendations for Other Services       Precautions / Restrictions Precautions Precautions: Fall      Mobility Bed Mobility Overal bed mobility: Modified Independent                Transfers Overall transfer level: Needs assistance   Transfers: Sit to/from Stand Sit to Stand: Supervision         General transfer comment: safety/lines    Balance Overall balance assessment: Needs assistance   Sitting balance-Leahy Scale: Normal Sitting balance - Comments: no LOB bending forward to reach feet     Standing balance-Leahy Scale: Fair                             ADL either performed or assessed with clinical judgement   ADL Overall ADL's : Needs assistance/impaired Eating/Feeding: Set up;Sitting   Grooming: Supervision/safety;Standing;Wash/dry hands   Upper Body Bathing: Minimal assistance;Sitting   Lower Body Bathing: Minimal assistance;Sit to/from stand   Upper Body Dressing : Minimal assistance;Sitting   Lower Body Dressing: Minimal  assistance;Sit to/from stand   Toilet Transfer: Ambulation;Min guard   Toileting- Architect and Hygiene: Supervision/safety;Sit to/from stand       Functional mobility during ADLs: Min guard       Vision Baseline Vision/History: Wears glasses Wears Glasses: At all times Patient Visual Report: No change from baseline       Perception     Praxis      Pertinent Vitals/Pain Pain Assessment: No/denies pain     Hand Dominance Right   Extremity/Trunk Assessment Upper Extremity Assessment Upper Extremity Assessment: LUE deficits/detail LUE Deficits / Details: Brunnstrom level II-hand, IV arm LUE Sensation: decreased proprioception (intact temperatur) LUE Coordination: decreased fine motor;decreased gross motor (no functional use)   Lower Extremity Assessment Lower Extremity Assessment: Defer to PT evaluation   Cervical / Trunk Assessment Cervical / Trunk Assessment: Normal   Communication Communication Communication: Other (comment) (minimal verbalization, guarded)   Cognition Arousal/Alertness: Awake/alert Behavior During Therapy: Flat affect Overall Cognitive Status: Difficult to assess                                     General Comments       Exercises     Shoulder Instructions      Home Living Family/patient expects to be discharged to:: Other (Comment) (motel) Living Arrangements: Spouse/significant other (boyfriend) Available Help at Discharge: Available PRN/intermittently Type of Home: Other(Comment) (motel)  Home Access: Level entry     Home Layout: One level     Bathroom Shower/Tub: Chief Strategy Officer: Handicapped height     Home Equipment: None          Prior Functioning/Environment Level of Independence: Independent        Comments: has been able to care for herself despite L UE weakness        OT Problem List: Decreased strength;Decreased coordination;Impaired UE functional use       OT Treatment/Interventions: Self-care/ADL training;Neuromuscular education;Patient/family education;Therapeutic activities    OT Goals(Current goals can be found in the care plan section) Acute Rehab OT Goals Patient Stated Goal: improve L UE function OT Goal Formulation: With patient Time For Goal Achievement: 04/30/20 Potential to Achieve Goals: Good ADL Goals Pt Will Transfer to Toilet: with modified independence;ambulating Pt Will Perform Toileting - Clothing Manipulation and hygiene: with modified independence;sit to/from stand Pt/caregiver will Perform Home Exercise Program: Right Upper extremity;Independently;Increased strength Additional ADL Goal #1: Pt will perform basic ADL using compensatory strategies with supervision.  OT Frequency: Min 3X/week   Barriers to D/C:            Co-evaluation              AM-PAC OT "6 Clicks" Daily Activity     Outcome Measure Help from another person eating meals?: A Little Help from another person taking care of personal grooming?: A Little Help from another person toileting, which includes using toliet, bedpan, or urinal?: A Little Help from another person bathing (including washing, rinsing, drying)?: A Little Help from another person to put on and taking off regular upper body clothing?: A Little Help from another person to put on and taking off regular lower body clothing?: A Little 6 Click Score: 18   End of Session Nurse Communication: Other (comment) (per RN, pt and mom attempting to go outside and smoke)  Activity Tolerance: Patient tolerated treatment well Patient left: in bed;with call bell/phone within reach (MD in room)  OT Visit Diagnosis: Hemiplegia and hemiparesis Hemiplegia - Right/Left: Left Hemiplegia - dominant/non-dominant: Non-Dominant Hemiplegia - caused by: Cerebral infarction                Time: 0347-4259 OT Time Calculation (min): 14 min Charges:  OT General Charges $OT Visit: 1 Visit OT  Evaluation $OT Eval Moderate Complexity: 1 Mod  Martie Round, OTR/L Acute Rehabilitation Services Pager: [phone] Office: [phone]  Evern Bio 04/16/2020, 10:44 AM

## 2020-04-16 NOTE — Progress Notes (Signed)
Inpatient Diabetes Program Recommendations  AACE/ADA: New Consensus Statement on Inpatient Glycemic Control (2015)  Target Ranges:  Prepandial:   less than 140 mg/dL      Peak postprandial:   less than 180 mg/dL (1-2 hours)      Critically ill patients:  140 - 180 mg/dL   Lab Results  Component Value Date   GLUCAP 231 (H) 04/16/2020   HGBA1C 10.7 (H) 03/04/2020    Review of Glycemic Control  Diabetes history: DM 2 Outpatient Diabetes medications: Metformin 1000 mg bid Current orders for Inpatient glycemic control:  Novolog SSI  A1c 10.7%  Inpatient Diabetes Program Recommendations:    Spoke with pt about DM control at home and current A1c level. Pt has not been compliant with her diet at home. Pt reports checking glucose once a day.  Encouraged pt to check glucose bid, discussed diet. Recommend second oral agent based on glucose trends (note MD has already ordered for d/c).  Pt lives with her boyfriend. She mentioned if she had to be on insulin (she does not prefer this) her boyfriend or aunt could help (aunt has helped other family members in the past with their insulin). Did warn pt about tight glucose control.   Discussed glucose and A1c goals for d/c.  Thanks,  Christena Deem RN, MSN, BC-ADM Inpatient Diabetes Coordinator Team Pager [phone] (8a-5p)

## 2020-04-16 NOTE — Progress Notes (Signed)
STROKE TEAM PROGRESS NOTE   INTERVAL HISTORY I have personally reviewed history of present illness with the patient, electronic medical records and imaging films in PACS.  She presented with increased left upper extremity weakness starting yesterday.  MRI scan of the brain shows multiple new acute infarcts involving right frontal, parietal and temporal lobes largest 1 being along the cingulate gyrus and genu of corpus callosum.  There is also a tiny infarct in the right parasagittal pons. CT angiogram shows acute proximal right ICA occlusion which is new from previous study.  There however appears to be distal reconstitution of the supraclinoid segment by collateral flow from circle of Willis.  She had been previously admitted in April 2020 with multiple infarcts in left hemispheres as well as in June 2021 with multiple small infarcts in the right hemispheres and few in the left nose found to have urine drug screen positive for cocaine and marijuana at that time.  Previously hypercoagulable and vasculitic panel labs were negative in April 2020.  TCD bubble study was also negative for PFO.  Urine drug screen is pending this admission.  She is not considered a candidate for TEE and loop recorder because of medication nonadherence. Vitals:   04/16/20 1059 04/16/20 1300 04/16/20 1337 04/16/20 1339  BP: (!) 188/104 (!) 178/121 (!) 168/95   Pulse: 76   84  Resp:  (!) 24 18 (!) 23  Temp:      TempSrc:      SpO2: 93%   98%   CBC:  Recent Labs  Lab 04/15/20 1652  WBC 8.6  NEUTROABS 5.4  HGB 14.1  HCT 43.2  MCV 87.1  PLT 214   Basic Metabolic Panel:  Recent Labs  Lab 04/15/20 1652  NA 135  K 3.7  CL 100  CO2 22  GLUCOSE 378*  BUN 7  CREATININE 0.89  CALCIUM 9.7   Lipid Panel: No results for input(s): CHOL, TRIG, HDL, CHOLHDL, VLDL, LDLCALC in the last 168 hours. HgbA1c: 10.7 Urine Drug Screen: No results for input(s): LABOPIA, COCAINSCRNUR, LABBENZ, AMPHETMU, THCU, LABBARB in the last  168 hours.  Alcohol Level  Recent Labs  Lab 04/15/20 1729  ETH <10    IMAGING past 24 hours CT ANGIO HEAD W OR WO CONTRAST  Result Date: 04/15/2020 CLINICAL DATA:  Initial evaluation for acute stroke. EXAM: CT ANGIOGRAPHY HEAD AND NECK TECHNIQUE: Multidetector CT imaging of the head and neck was performed using the standard protocol during bolus administration of intravenous contrast. Multiplanar CT image reconstructions and MIPs were obtained to evaluate the vascular anatomy. Carotid stenosis measurements (when applicable) are obtained utilizing NASCET criteria, using the distal internal carotid diameter as the denominator. CONTRAST:  75mL OMNIPAQUE IOHEXOL 350 MG/ML SOLN COMPARISON:  Prior MRI from earlier the same day. FINDINGS: CTA NECK FINDINGS Aortic arch: Visualized aortic arch of normal caliber with normal 3 vessel morphology. No flow-limiting stenosis seen about the origin of the great vessels. Right carotid system: Right CCA patent from its origin to the bifurcation without stenosis. There is abrupt occlusion of the right ICA just distal to the bifurcation, new from previous. Right ICA remains occluded distally within the neck. Left carotid system: Left CCA patent from its origin to the bifurcation without stenosis. Short-segment 40% stenosis noted at the origin of the left ICA, stable. Left ICA otherwise widely patent distally. Focal tortuosity of the distal cervical left ICA just prior to the skull base noted, stable. Vertebral arteries: Both vertebral arteries arise from the  subclavian arteries. No proximal subclavian artery stenosis. Vertebral arteries remain widely patent within the neck without stenosis, dissection or occlusion. Skeleton: No acute osseous finding. No discrete or worrisome osseous lesions. Other neck: No other acute soft tissue abnormality within the neck. Upper chest: Visualized upper chest demonstrates no acute finding. 4 mm left upper lobe nodule noted (series 6, image  147), indeterminate. Review of the MIP images confirms the above findings CTA HEAD FINDINGS Anterior circulation: Petrous left ICA remains widely patent. Mild diffuse narrowing of the left cavernous segment noted, stable. Left ICA otherwise patent to the terminus. Right ICA occluded at the skull base, with distal reconstitution at the supraclinoid segment via collateral flow across the circle-of-Willis. A1 segments remain patent. Normal anterior communicating artery complex. Anterior cerebral arteries patent to their distal aspects without stenosis. No M1 stenosis or occlusion. Normal MCA bifurcations. Distal MCA branches well perfused, although the right MCA is somewhat attenuated as compared to the left related to the acute right ICA occlusion. Posterior circulation: Both vertebral arteries remain patent to the vertebrobasilar junction without stenosis. Patent left PICA. Right PICA not well seen. Basilar widely patent to its distal aspect without stenosis. Superior cerebral arteries patent bilaterally. Both PCAs primarily supplied via the basilar well perfused to their distal aspects. Small bilateral posterior communicating arteries noted. Venous sinuses: Grossly patent allowing for timing the contrast bolus. Anatomic variants: None significant. Review of the MIP images confirms the above findings IMPRESSION: 1. Acute proximal right ICA occlusion, new from previous. Distal reconstitution at the supraclinoid segment via collateral flow across the circle-of-Willis. Right MCA is perfused although attenuated as compared to the contralateral left. 2. Otherwise stable CTA of the head and neck. No other large vessel occlusion, hemodynamically significant stenosis, or other acute vascular abnormality. 3. 4 mm left upper lobe nodule, indeterminate. No follow-up needed if patient is low-risk. Non-contrast chest CT can be considered in 12 months if patient is high-risk. This recommendation follows the consensus statement:  Guidelines for Management of Incidental Pulmonary Nodules Detected on CT Images: From the Fleischner Society 2017; Radiology 2017; 284:228-243. Electronically Signed   By: Rise Mu M.D.   On: 04/15/2020 22:52   CT HEAD WO CONTRAST  Result Date: 04/15/2020 CLINICAL DATA:  Focal neurological deficit. EXAM: CT HEAD WITHOUT CONTRAST TECHNIQUE: Contiguous axial images were obtained from the base of the skull through the vertex without intravenous contrast. COMPARISON:  MRI head, dated March 03, 2020. FINDINGS: Brain: No evidence of acute hemorrhage, hydrocephalus or extra-axial collection. A 1.6 cm x 1.3 cm well-defined area of white matter low attenuation is noted within the parasagittal region of the right frontal lobe. This is anterior to the frontal horn of the right lateral ventricle and represents a new finding when compared to the prior MRI of the head. A mild amount of mass effect is seen on the anterior aspect of the right lateral ventricle. No midline shift is seen. Vascular: No hyperdense vessel or unexpected calcification. Skull: Normal. Negative for fracture or focal lesion. Sinuses/Orbits: No acute finding. Other: None. IMPRESSION: 1.6 cm x 1.3 cm well-defined area of white matter low attenuation within the parasagittal region of the right frontal lobe which represents a new finding when compared to the prior MRI of the head and may represent a subacute infarct. Correlation with follow-up MRI of the head is recommended. Electronically Signed   By: Aram Candela M.D.   On: 04/15/2020 19:33   CT ANGIO NECK W OR WO CONTRAST  Result  Date: 04/15/2020 CLINICAL DATA:  Initial evaluation for acute stroke. EXAM: CT ANGIOGRAPHY HEAD AND NECK TECHNIQUE: Multidetector CT imaging of the head and neck was performed using the standard protocol during bolus administration of intravenous contrast. Multiplanar CT image reconstructions and MIPs were obtained to evaluate the vascular anatomy. Carotid  stenosis measurements (when applicable) are obtained utilizing NASCET criteria, using the distal internal carotid diameter as the denominator. CONTRAST:  75mL OMNIPAQUE IOHEXOL 350 MG/ML SOLN COMPARISON:  Prior MRI from earlier the same day. FINDINGS: CTA NECK FINDINGS Aortic arch: Visualized aortic arch of normal caliber with normal 3 vessel morphology. No flow-limiting stenosis seen about the origin of the great vessels. Right carotid system: Right CCA patent from its origin to the bifurcation without stenosis. There is abrupt occlusion of the right ICA just distal to the bifurcation, new from previous. Right ICA remains occluded distally within the neck. Left carotid system: Left CCA patent from its origin to the bifurcation without stenosis. Short-segment 40% stenosis noted at the origin of the left ICA, stable. Left ICA otherwise widely patent distally. Focal tortuosity of the distal cervical left ICA just prior to the skull base noted, stable. Vertebral arteries: Both vertebral arteries arise from the subclavian arteries. No proximal subclavian artery stenosis. Vertebral arteries remain widely patent within the neck without stenosis, dissection or occlusion. Skeleton: No acute osseous finding. No discrete or worrisome osseous lesions. Other neck: No other acute soft tissue abnormality within the neck. Upper chest: Visualized upper chest demonstrates no acute finding. 4 mm left upper lobe nodule noted (series 6, image 147), indeterminate. Review of the MIP images confirms the above findings CTA HEAD FINDINGS Anterior circulation: Petrous left ICA remains widely patent. Mild diffuse narrowing of the left cavernous segment noted, stable. Left ICA otherwise patent to the terminus. Right ICA occluded at the skull base, with distal reconstitution at the supraclinoid segment via collateral flow across the circle-of-Willis. A1 segments remain patent. Normal anterior communicating artery complex. Anterior cerebral  arteries patent to their distal aspects without stenosis. No M1 stenosis or occlusion. Normal MCA bifurcations. Distal MCA branches well perfused, although the right MCA is somewhat attenuated as compared to the left related to the acute right ICA occlusion. Posterior circulation: Both vertebral arteries remain patent to the vertebrobasilar junction without stenosis. Patent left PICA. Right PICA not well seen. Basilar widely patent to its distal aspect without stenosis. Superior cerebral arteries patent bilaterally. Both PCAs primarily supplied via the basilar well perfused to their distal aspects. Small bilateral posterior communicating arteries noted. Venous sinuses: Grossly patent allowing for timing the contrast bolus. Anatomic variants: None significant. Review of the MIP images confirms the above findings IMPRESSION: 1. Acute proximal right ICA occlusion, new from previous. Distal reconstitution at the supraclinoid segment via collateral flow across the circle-of-Willis. Right MCA is perfused although attenuated as compared to the contralateral left. 2. Otherwise stable CTA of the head and neck. No other large vessel occlusion, hemodynamically significant stenosis, or other acute vascular abnormality. 3. 4 mm left upper lobe nodule, indeterminate. No follow-up needed if patient is low-risk. Non-contrast chest CT can be considered in 12 months if patient is high-risk. This recommendation follows the consensus statement: Guidelines for Management of Incidental Pulmonary Nodules Detected on CT Images: From the Fleischner Society 2017; Radiology 2017; 284:228-243. Electronically Signed   By: Rise Mu M.D.   On: 04/15/2020 22:52   MR BRAIN W WO CONTRAST  Result Date: 04/15/2020 CLINICAL DATA:  Left arm paralysis EXAM: MRI  HEAD WITHOUT AND WITH CONTRAST TECHNIQUE: Multiplanar, multiecho pulse sequences of the brain and surrounding structures were obtained without and with intravenous contrast.  CONTRAST:  60mL GADAVIST GADOBUTROL 1 MMOL/ML IV SOLN COMPARISON:  03/03/2020 FINDINGS: Brain: There are multiple new foci of restricted diffusion in the right cerebral hemisphere with involvement frontal, parietal, and temporal lobes. The largest area of involvement is along the cingulate gyrus and genu of the corpus callosum. Additionally, there is a focus of restricted diffusion in the parasagittal right pons. There is expected evolution of the numerous small infarcts on the prior study including minimal enhancement associated with some. No evidence of hemorrhage. There is no intracranial mass or mass effect. Additional patchy foci of T2 hyperintensity in the supratentorial white matter nonspecific but probably reflects stable chronic microvascular ischemic changes. There is a chronic infarct involving the right brachium pontis. There is no hydrocephalus or extra-axial fluid collection. Vascular: Major vessel flow voids at the skull base are preserved. Skull and upper cervical spine: Normal marrow signal is preserved. Sinuses/Orbits: Paranasal sinuses are aerated. Orbits are unremarkable. Other: Sella is unremarkable.  Mastoid air cells are clear. IMPRESSION: Multiple new acute infarcts in the right cerebral hemisphere. Small acute infarct of the right pons. Expected evolution of infarcts on the prior study. Electronically Signed   By: Guadlupe Spanish M.D.   On: 04/15/2020 21:53    PHYSICAL EXAM Pleasant obese middle-aged African-American lady not in distress. . Afebrile. Head is nontraumatic. Neck is supple without bruit.    Cardiac exam no murmur or gallop. Lungs are clear to auscultation. Distal pulses are well felt. Neurological Exam ;  Awake  Alert oriented x 3.  Mild dysarthria.  No aphasia.  Flat affect.  Right gaze preference but can look to the left past midline.  Blinks to threat more on the right than left..fundi were not visualized. Vision acuity and fields appear normal. Hearing is normal.  Palatal movements are normal.  Mild left lower facial weakness.. Tongue midline. Normal strength, tone, reflexes and coordination on the right side.  Left hemiparesis with 2/5 strength of the left shoulder and elbow and significant weakness of left grip and intrinsic hand muscles.  Left lower extremity strength is 3/5 with mild weakness of hip flexors and ankle dorsiflexors.  Tone is slightly increased on the left compared to the right.. Normal sensation. Gait deferred. ASSESSMENT/PLAN Ebony Matthews is a 39 y.o. female with history of ongoing cocaine abuse, medication noncompliance, HTN, DM, HLD and recent admission for stroke presenting with new onset of LUE paralysis.   Stroke:   New R cerebral and R pontine infarcts, recurrent embolic infarcts, now w/ R ICA stenosis in pt w/ uncontrolled risk factors, drug use and medication non-compliance  CT head new R frontal lobe infarct   MRI  New R cerebral and R pontine infarcts  CTA head & neck proximal R ICA occlusion, new. 74mm RUL nodule.  2D Echo June 2021 - EF 60-65%. No source of embolus   LDL pending   HgbA1c 10.7  VTE prophylaxis - Lovenox 40 mg sq daily   aspirin 81 mg daily prior to admission, now on aspirin 81 mg daily and clopidogrel 75 mg daily. Continue DAPT x 3 months then plavix alone     Therapy recommendations:  HH OT   Disposition:  pending   Carotid occlusion   Hypertensive Emergency  BP as high as 204/106 . Permissive hypertension (OK if < 220/120) but gradually normalize in 5-7 days .  Long-term BP goal normotensive  Hyperlipidemia  Home meds:  lipitor 40, resumed in hospital  LDL pending, goal < 70  Continue statin at discharge  Diabetes type II Uncontrolled  HgbA1c 10.7, goal < 7.0  Other Stroke Risk Factors  Cigarette smoker, advised to stop smoking. She is not ready to quit    ETOH use, 1 drink(s) a day  Substance abuse - THC, Cocaine. Patient advised to stop using due to stroke risk. She  is not ready to quit    Obesity, There is no height or weight on file to calculate BMI., recommend weight loss, diet and exercise as appropriate   Hx stroke/TIA  02/2020 - Multiple bilateral infarcts - embolic - source unknown (substance abuse, uncontrolled HTN, HLD, DB, obese).   12/2018 - Multiple bilateral infarcts - embolic - unknown source, UDS +Cocaine. Phosphatydalserine IgM > 100 (H), Prot S 55 (L)  Other Active Problems  Medication noncompliance   Hospital day # 0  Patient with multiple prior strokes and multiple risk factors and medication noncompliance and drug abuse comes in with new left arm weakness secondary to right carotid occlusion versus high-grade stenosis but appears to have good collateral flow distally.  Recommend dual antiplatelet therapy aspirin Plavix for 3 months followed by aspirin alone and aggressive risk factor modification.  Patient counseled to quit using cocaine and marijuana.  Will not pursue more aggressive treatment options with angioplasty and stenting given her history of medication noncompliance and drug abuse.  Continue ongoing therapies.  Long discussion with patient and Dr. Arbutus Leas and answered questions.  Greater than 50% time during this 35-minute visit was spent on counseling and coordination of care about her stroke and carotid occlusion and need for medication compliance and aggressive risk factor modification. Delia Heady, MD To contact Stroke Continuity provider, please refer to [email or web address]. After hours, contact General Neurology

## 2020-04-16 NOTE — ED Notes (Signed)
High falls risk due to L side weakness in LLE and fall history. Fall risk precautions implemented.

## 2020-04-23 ENCOUNTER — Emergency Department (HOSPITAL_COMMUNITY)
Admission: EM | Admit: 2020-04-23 | Discharge: 2020-04-23 | Disposition: A | Discharge status: Home / Self Care | Payer: Commercial Managed Care - PPO | Attending: Emergency Medicine | Admitting: Emergency Medicine

## 2020-04-23 DIAGNOSIS — Z7982 Long term (current) use of aspirin: Secondary | ICD-10-CM | POA: Insufficient documentation

## 2020-04-23 DIAGNOSIS — T17920A Food in respiratory tract, part unspecified causing asphyxiation, initial encounter: Secondary | ICD-10-CM | POA: Diagnosis not present

## 2020-04-23 DIAGNOSIS — R202 Paresthesia of skin: Secondary | ICD-10-CM | POA: Insufficient documentation

## 2020-04-23 DIAGNOSIS — I10 Essential (primary) hypertension: Secondary | ICD-10-CM | POA: Diagnosis not present

## 2020-04-23 DIAGNOSIS — E1165 Type 2 diabetes mellitus with hyperglycemia: Secondary | ICD-10-CM | POA: Diagnosis not present

## 2020-04-23 DIAGNOSIS — F1210 Cannabis abuse, uncomplicated: Secondary | ICD-10-CM | POA: Diagnosis not present

## 2020-04-23 DIAGNOSIS — Y929 Unspecified place or not applicable: Secondary | ICD-10-CM | POA: Diagnosis not present

## 2020-04-23 DIAGNOSIS — Z79899 Other long term (current) drug therapy: Secondary | ICD-10-CM | POA: Insufficient documentation

## 2020-04-23 DIAGNOSIS — Y939 Activity, unspecified: Secondary | ICD-10-CM | POA: Insufficient documentation

## 2020-04-23 DIAGNOSIS — R112 Nausea with vomiting, unspecified: Secondary | ICD-10-CM | POA: Diagnosis not present

## 2020-04-23 DIAGNOSIS — Z7902 Long term (current) use of antithrombotics/antiplatelets: Secondary | ICD-10-CM | POA: Diagnosis not present

## 2020-04-23 DIAGNOSIS — X58XXXA Exposure to other specified factors, initial encounter: Secondary | ICD-10-CM | POA: Diagnosis not present

## 2020-04-23 DIAGNOSIS — F17210 Nicotine dependence, cigarettes, uncomplicated: Secondary | ICD-10-CM | POA: Insufficient documentation

## 2020-04-23 DIAGNOSIS — R531 Weakness: Secondary | ICD-10-CM | POA: Diagnosis not present

## 2020-04-23 DIAGNOSIS — T17308A Unspecified foreign body in larynx causing other injury, initial encounter: Secondary | ICD-10-CM

## 2020-04-23 DIAGNOSIS — F1590 Other stimulant use, unspecified, uncomplicated: Secondary | ICD-10-CM | POA: Diagnosis not present

## 2020-04-23 DIAGNOSIS — Y999 Unspecified external cause status: Secondary | ICD-10-CM | POA: Insufficient documentation

## 2020-04-23 DIAGNOSIS — Z7984 Long term (current) use of oral hypoglycemic drugs: Secondary | ICD-10-CM | POA: Insufficient documentation

## 2020-04-23 DIAGNOSIS — R1310 Dysphagia, unspecified: Secondary | ICD-10-CM | POA: Diagnosis present

## 2020-04-23 LAB — CBC WITH DIFFERENTIAL/PLATELET
Abs Immature Granulocytes: 0.02 10*3/uL (ref 0.00–0.07)
Basophils Absolute: 0 10*3/uL (ref 0.0–0.1)
Basophils Relative: 0 %
Eosinophils Absolute: 0.1 10*3/uL (ref 0.0–0.5)
Eosinophils Relative: 1 %
HCT: 43.7 % (ref 36.0–46.0)
Hemoglobin: 13.9 g/dL (ref 12.0–15.0)
Immature Granulocytes: 0 %
Lymphocytes Relative: 41 %
Lymphs Abs: 3.1 10*3/uL (ref 0.7–4.0)
MCH: 28.8 pg (ref 26.0–34.0)
MCHC: 31.8 g/dL (ref 30.0–36.0)
MCV: 90.5 fL (ref 80.0–100.0)
Monocytes Absolute: 0.5 10*3/uL (ref 0.1–1.0)
Monocytes Relative: 7 %
Neutro Abs: 3.8 10*3/uL (ref 1.7–7.7)
Neutrophils Relative %: 51 %
Platelets: 199 10*3/uL (ref 150–400)
RBC: 4.83 MIL/uL (ref 3.87–5.11)
RDW: 15.3 % (ref 11.5–15.5)
WBC: 7.5 10*3/uL (ref 4.0–10.5)
nRBC: 0 % (ref 0.0–0.2)

## 2020-04-23 LAB — COMPREHENSIVE METABOLIC PANEL
ALT: 21 U/L (ref 0–44)
AST: 23 U/L (ref 15–41)
Albumin: 3.9 g/dL (ref 3.5–5.0)
Alkaline Phosphatase: 74 U/L (ref 38–126)
Anion gap: 11 (ref 5–15)
BUN: 7 mg/dL (ref 6–20)
CO2: 21 mmol/L — ABNORMAL LOW (ref 22–32)
Calcium: 9.5 mg/dL (ref 8.9–10.3)
Chloride: 101 mmol/L (ref 98–111)
Creatinine, Ser: 0.81 mg/dL (ref 0.44–1.00)
GFR calc Af Amer: >60 mL/min (ref >60)
GFR calc non Af Amer: >60 mL/min (ref >60)
Glucose, Bld: 200 mg/dL — ABNORMAL HIGH (ref 70–99)
Potassium: 3.9 mmol/L (ref 3.5–5.1)
Sodium: 133 mmol/L — ABNORMAL LOW (ref 135–145)
Total Bilirubin: 0.7 mg/dL (ref 0.3–1.2)
Total Protein: 7.1 g/dL (ref 6.5–8.1)

## 2020-04-23 LAB — CBG MONITORING, ED: Glucose-Capillary: 198 mg/dL — ABNORMAL HIGH (ref 70–99)

## 2020-04-23 NOTE — ED Triage Notes (Signed)
Pt here with emesis when trying to take her metformin tablet. Pt states the metformin tablet is too big and it causes her to throw it up. Pt concerned about controlling diabetes due to recent stroke.

## 2020-04-23 NOTE — ED Provider Notes (Signed)
[facility] EMERGENCY DEPARTMENT Provider Note   CSN: [id number] Arrival date & time: 04/23/20  1021     History Chief Complaint  Patient presents with  . Emesis    Ebony Matthews is a 39 y.o. female history of diabetes, stroke with left-sided residual weakness, present emergency department with difficulty swallowing her medicines.  Patient reports that her Metformin tablets too big, and she feels like she is gagging on it when she tries to take it.  Family members have tried crushing it up and the patient says it tastes gross.   Separately, the patient's mother at bedside and her aunt over the phone report desire to get the patient placed in inpatient rehab.  She stated when she was in the hospital last week being treated for stroke, she been recommended for inpatient rehab, but she decided to leave to go home.  They do report that she is pending placement on Friday, but wonder if she can get placed sooner.  The patient is now agreeable for rehab.  She is in her normal state of mind.  HPI     Past Medical History:  Diagnosis Date  . Diabetes mellitus without complication (HCC)   . Hypertension   . TIA (transient ischemic attack) 01/22/2019    Patient Active Problem List   Diagnosis Date Noted  . Uncontrolled type 2 diabetes mellitus with hyperglycemia, without long-term current use of insulin (HCC) 04/16/2020  . CVA (cerebral vascular accident) (HCC) 04/15/2020  . Cocaine use 04/15/2020  . Acute CVA (cerebrovascular accident) (HCC) 03/04/2020  . Stroke (cerebrum) (HCC) 03/03/2020  . Weakness   . Noncompliance with medication regimen   . TIA (transient ischemic attack) 01/22/2019  . Diabetes mellitus due to underlying condition without complications (HCC) 05/30/2014  . Essential hypertension, benign 05/30/2014  . Smoking 05/30/2014    Past Surgical History:  Procedure Laterality Date  . NO PAST SURGERIES       OB History    Gravida  1   Para       Term      Preterm      AB  1   Living        SAB  1   TAB      Ectopic      Multiple      Live Births              Family History  Problem Relation Age of Onset  . Diabetes Other   . Hypertension Maternal Grandmother     Social History   Tobacco Use  . Smoking status: Current Every Day Smoker    Packs/day: 1.00    Years: 15.00    Pack years: 15.00  . Smokeless tobacco: Never Used  Vaping Use  . Vaping Use: Never used  Substance Use Topics  . Alcohol use: No  . Drug use: Yes    Types: Marijuana    Comment: occasional    Home Medications Prior to Admission medications   Medication Sig Start Date End Date Taking? Authorizing Provider  aspirin EC 81 MG tablet Take 1 tablet (81 mg total) by mouth daily. Patient not taking: Reported on 04/15/2020 03/04/20 03/04/21  Lurene Shadow, MD  atorvastatin (LIPITOR) 80 MG tablet Take 0.5 tablets (40 mg total) by mouth daily at 6 PM. 03/04/20   Lurene Shadow, MD  clopidogrel (PLAVIX) 75 MG tablet Take 1 tablet (75 mg total) by mouth daily. X 21 days 04/16/20   Catarina Hartshorn,  MD  glipiZIDE (GLUCOTROL) 5 MG tablet Take 1 tablet (5 mg total) by mouth 2 (two) times daily before a meal. Patient not taking: Reported on 04/15/2020 03/04/20   Lurene Shadow, MD  hydrochlorothiazide (MICROZIDE) 12.5 MG capsule Take 1 capsule (12.5 mg total) by mouth daily. Patient not taking: Reported on 04/15/2020 03/04/20   Lurene Shadow, MD  lisinopril (ZESTRIL) 20 MG tablet Take 1 tablet (20 mg total) by mouth daily. 03/04/20 04/15/20  Lurene Shadow, MD  metFORMIN (GLUCOPHAGE) 1000 MG tablet Take 1 tablet (1,000 mg total) by mouth 2 (two) times daily. 03/04/20   Lurene Shadow, MD    Allergies    Patient has no known allergies.  Review of Systems   Review of Systems  Constitutional: Negative for chills and fever.  HENT: Negative for drooling and ear discharge.   Eyes: Negative for photophobia and visual disturbance.  Respiratory: Negative for cough  and shortness of breath.   Cardiovascular: Negative for chest pain and palpitations.  Gastrointestinal: Positive for nausea. Negative for abdominal pain.  Musculoskeletal: Negative for arthralgias and back pain.  Skin: Negative for color change and rash.  Neurological: Positive for weakness and numbness.  Psychiatric/Behavioral: Negative for agitation and confusion.  All other systems reviewed and are negative.   Physical Exam Updated Vital Signs BP (!) 140/97 (BP Location: Right Arm)   Pulse 102   Temp 98.2 F (36.8 C) (Oral)   Resp 18   SpO2 100%   Physical Exam Vitals and nursing note reviewed.  Constitutional:      General: She is not in acute distress.    Appearance: She is well-developed.  HENT:     Head: Normocephalic and atraumatic.     Comments: Speaking clearly Eyes:     Conjunctiva/sclera: Conjunctivae normal.  Cardiovascular:     Rate and Rhythm: Normal rate and regular rhythm.     Pulses: Normal pulses.  Pulmonary:     Effort: Pulmonary effort is normal. No respiratory distress.  Abdominal:     Tenderness: There is no abdominal tenderness.  Musculoskeletal:     Cervical back: Neck supple.     Comments: 1/5 strength in left upper and left lower extremity  Skin:    General: Skin is warm and dry.  Neurological:     Mental Status: She is alert.     ED Results / Procedures / Treatments   Labs (all labs ordered are listed, but only abnormal results are displayed) Labs Reviewed  COMPREHENSIVE METABOLIC PANEL - Abnormal; Notable for the following components:      Result Value   Sodium 133 (*)    CO2 21 (*)    Glucose, Bld 200 (*)    All other components within normal limits  CBG MONITORING, ED - Abnormal; Notable for the following components:   Glucose-Capillary 198 (*)    All other components within normal limits  CBC WITH DIFFERENTIAL/PLATELET    EKG None  Radiology No results found.  Procedures Procedures (including critical care  time)  Medications Ordered in ED Medications - No data to display  ED Course  I have reviewed the triage vital signs and the nursing notes.  Pertinent labs & imaging results that were available during my care of the patient were reviewed by me and considered in my medical decision making (see chart for details).  39 yo female presenting to ED with complaint of difficulty swallowing large pills at home.  This is a particular issue with her metformin.  She  has no new focal complaints since her large stroke at the start of the month, and continues having weakness in her left arm and her left leg.  I have a low suspicion for new or developing stroke at this time.  Regarding her pills, I advised breaking them up or crushing the metformin and mixing with some food if she needs to.  She needs good control of her blood sugars to prevent further strokes.  She is on glipizide and metformin.  I stressed the importance of these medications and asked that their prescribing doctor be kept updated about her blood sugars (they are monitoring at home).  I advised they keep a daily log of her numbers.  Her family members (mother at bedside, sister and aunt by phone) were upset with her for leaving the hospital without engaging in physical therapy, and asked me to explain the importance of this to her, which I did.  She is now willing to engage in PT and has this set up on Friday.  I will consult with our case manager, although I'm not sure if any other resources can be set up faster than the end of this week.  I do not think she is deconditioned or frail enough to require hospitalization at this time.  Her family is helping care for her.  I personally reviewed her labs today - mild hyperglycemia with BS 200, no leukocytosis, normal hgb, no significant electrolyte derangement, covid negative    Final Clinical Impression(s) / ED Diagnoses Final diagnoses:  Choking, initial encounter    Rx / DC Orders ED  Discharge Orders    None       Terald Sleeper, MD 04/23/20 1715

## 2020-04-23 NOTE — ED Notes (Signed)
Patient verbalizes understanding of discharge instructions. Opportunity for questioning and answers were provided. Armband removed by staff, pt discharged from ED.  

## 2020-04-23 NOTE — Discharge Instructions (Addendum)
I recommend that you crush up or break up the pills that are too big to swallow, and mix them with some food or juice while taking them.  I placed a consult with our case manager to call and talk to you about the physical therapy situation.  I do strongly recommend that you get physical therapy to give you the best chance to recover your strength from this stroke.

## 2020-04-24 ENCOUNTER — Emergency Department (HOSPITAL_COMMUNITY)
Admission: EM | Admit: 2020-04-24 | Discharge: 2020-04-24 | Disposition: A | Discharge status: Home / Self Care | Payer: Commercial Managed Care - PPO | Attending: Emergency Medicine | Admitting: Emergency Medicine

## 2020-04-24 ENCOUNTER — Other Ambulatory Visit: Payer: Self-pay

## 2020-04-24 ENCOUNTER — Encounter (HOSPITAL_COMMUNITY): Payer: Self-pay | Admitting: Emergency Medicine

## 2020-04-24 ENCOUNTER — Emergency Department (HOSPITAL_COMMUNITY): Payer: Commercial Managed Care - PPO

## 2020-04-24 DIAGNOSIS — W19XXXA Unspecified fall, initial encounter: Secondary | ICD-10-CM

## 2020-04-24 DIAGNOSIS — R531 Weakness: Secondary | ICD-10-CM | POA: Insufficient documentation

## 2020-04-24 DIAGNOSIS — Z7984 Long term (current) use of oral hypoglycemic drugs: Secondary | ICD-10-CM | POA: Diagnosis not present

## 2020-04-24 DIAGNOSIS — Z7982 Long term (current) use of aspirin: Secondary | ICD-10-CM | POA: Insufficient documentation

## 2020-04-24 DIAGNOSIS — F17200 Nicotine dependence, unspecified, uncomplicated: Secondary | ICD-10-CM | POA: Diagnosis not present

## 2020-04-24 DIAGNOSIS — I10 Essential (primary) hypertension: Secondary | ICD-10-CM | POA: Insufficient documentation

## 2020-04-24 DIAGNOSIS — R296 Repeated falls: Secondary | ICD-10-CM | POA: Diagnosis not present

## 2020-04-24 DIAGNOSIS — Z79899 Other long term (current) drug therapy: Secondary | ICD-10-CM | POA: Diagnosis not present

## 2020-04-24 DIAGNOSIS — E119 Type 2 diabetes mellitus without complications: Secondary | ICD-10-CM | POA: Diagnosis not present

## 2020-04-24 NOTE — ED Triage Notes (Signed)
Pt states she recently had a stroke and was sent home just yesterday. Pt states this stroke left her with left sided weakness, and since being home she has fallen at least 4 times. Pt came ito ED after falling this morning and landing on left knee. Pt denies hitting her head. Pt does not start out patient rehab until Friday

## 2020-04-24 NOTE — ED Notes (Signed)
Ebony Matthews (Mother#[phone]) called for an update. Also to let nurse know patient has not take her meds and has not had anything to eat.  Thank you

## 2020-04-24 NOTE — Discharge Instructions (Addendum)
Please make sure if you are having difficulty swallowing your medication to break the tablet in half and take it with something that tastes good to you like a diet soda. Return for any new or worsening symptoms.

## 2020-04-24 NOTE — ED Provider Notes (Signed)
[facility] EMERGENCY DEPARTMENT Provider Note   CSN: [id number] Arrival date & time: 04/24/20  4098     History Chief Complaint  Patient presents with  . Fall    Ebony Matthews is a 39 y.o. female.  Who presents emergency department with chief complaint of fall.  Patient states that she has been falling at home frequently due to left-sided hemiparesis after recent CVA.  Patient states that her aunt is at home and can help her get around however she has not been asking her to do so has just been falling.  She denies any significant pain in the knee.  Patient states that her mom made her come for that.  She states that she is afraid she is going to have another stroke and that her Metformin tablets are too big for her to swallow.  She states that she has not been breaking them in half because when she tried previously it "was nasty."  She has been drinking them with milk.  She has no other complaints at this time.  HPI     Past Medical History:  Diagnosis Date  . Diabetes mellitus without complication (HCC)   . Hypertension   . TIA (transient ischemic attack) 01/22/2019    Patient Active Problem List   Diagnosis Date Noted  . Uncontrolled type 2 diabetes mellitus with hyperglycemia, without long-term current use of insulin (HCC) 04/16/2020  . CVA (cerebral vascular accident) (HCC) 04/15/2020  . Cocaine use 04/15/2020  . Acute CVA (cerebrovascular accident) (HCC) 03/04/2020  . Stroke (cerebrum) (HCC) 03/03/2020  . Weakness   . Noncompliance with medication regimen   . TIA (transient ischemic attack) 01/22/2019  . Diabetes mellitus due to underlying condition without complications (HCC) 05/30/2014  . Essential hypertension, benign 05/30/2014  . Smoking 05/30/2014    Past Surgical History:  Procedure Laterality Date  . NO PAST SURGERIES       OB History    Gravida  1   Para      Term      Preterm      AB  1   Living        SAB  1   TAB       Ectopic      Multiple      Live Births              Family History  Problem Relation Age of Onset  . Diabetes Other   . Hypertension Maternal Grandmother     Social History   Tobacco Use  . Smoking status: Current Every Day Smoker    Packs/day: 1.00    Years: 15.00    Pack years: 15.00  . Smokeless tobacco: Never Used  Vaping Use  . Vaping Use: Never used  Substance Use Topics  . Alcohol use: No  . Drug use: Yes    Types: Marijuana    Comment: occasional    Home Medications Prior to Admission medications   Medication Sig Start Date End Date Taking? Authorizing Provider  aspirin EC 81 MG tablet Take 1 tablet (81 mg total) by mouth daily. Patient not taking: Reported on 04/15/2020 03/04/20 03/04/21  Lurene Shadow, MD  atorvastatin (LIPITOR) 80 MG tablet Take 0.5 tablets (40 mg total) by mouth daily at 6 PM. 03/04/20   Lurene Shadow, MD  clopidogrel (PLAVIX) 75 MG tablet Take 1 tablet (75 mg total) by mouth daily. X 21 days 04/16/20   Catarina Hartshorn, MD  glipiZIDE Sallyanne Kuster)  5 MG tablet Take 1 tablet (5 mg total) by mouth 2 (two) times daily before a meal. Patient not taking: Reported on 04/15/2020 03/04/20   Lurene Shadow, MD  hydrochlorothiazide (MICROZIDE) 12.5 MG capsule Take 1 capsule (12.5 mg total) by mouth daily. Patient not taking: Reported on 04/15/2020 03/04/20   Lurene Shadow, MD  lisinopril (ZESTRIL) 20 MG tablet Take 1 tablet (20 mg total) by mouth daily. 03/04/20 04/15/20  Lurene Shadow, MD  metFORMIN (GLUCOPHAGE) 1000 MG tablet Take 1 tablet (1,000 mg total) by mouth 2 (two) times daily. 03/04/20   Lurene Shadow, MD    Allergies    Patient has no known allergies.  Review of Systems   Review of Systems  Constitutional: Negative for chills and fever.  Musculoskeletal: Negative for joint swelling.  Skin: Negative for wound.  Neurological: Positive for weakness. Negative for numbness.    Physical Exam Updated Vital Signs BP 107/77   Pulse 88   Temp 98.5 F  (36.9 C) (Oral)   Resp 15   Ht 5\' 7"  (1.702 m)   Wt 81.6 kg   LMP 03/30/2020 (Approximate)   SpO2 95%   BMI 28.19 kg/m   Physical Exam Vitals and nursing note reviewed.  Constitutional:      General: She is not in acute distress.    Appearance: She is well-developed. She is not diaphoretic.     Comments: Hirsute  HENT:     Head: Normocephalic and atraumatic.  Eyes:     General: No scleral icterus.    Conjunctiva/sclera: Conjunctivae normal.  Cardiovascular:     Rate and Rhythm: Normal rate and regular rhythm.     Heart sounds: Normal heart sounds. No murmur heard.  No friction rub. No gallop.   Pulmonary:     Effort: Pulmonary effort is normal. No respiratory distress.     Breath sounds: Normal breath sounds.  Abdominal:     General: Bowel sounds are normal. There is no distension.     Palpations: Abdomen is soft. There is no mass.     Tenderness: There is no abdominal tenderness. There is no guarding.  Musculoskeletal:     Cervical back: Normal range of motion.     Comments: Left knee without significant swelling or deformity, weakness noted, full passive range of motion.  Skin:    General: Skin is warm and dry.  Neurological:     Mental Status: She is alert and oriented to person, place, and time.  Psychiatric:        Behavior: Behavior normal.     ED Results / Procedures / Treatments   Labs (all labs ordered are listed, but only abnormal results are displayed) Labs Reviewed - No data to display  EKG None  Radiology DG Knee Complete 4 Views Left  Result Date: 04/24/2020 CLINICAL DATA:  Pain following fall EXAM: LEFT KNEE - COMPLETE 4+ VIEW COMPARISON:  None. FINDINGS: Frontal, lateral, and bilateral oblique views were obtained. No fracture or dislocation. No joint effusion. Joint spaces appear normal. No erosive change. IMPRESSION: No fracture, dislocation, or joint effusion. No evident arthropathy. Electronically Signed   By: 04/26/2020 III M.D.   On:  04/24/2020 09:41    Procedures Procedures (including critical care time)  Medications Ordered in ED Medications - No data to display  ED Course  I have reviewed the triage vital signs and the nursing notes.  Pertinent labs & imaging results that were available during my care of the patient were reviewed  by me and considered in my medical decision making (see chart for details).    MDM Rules/Calculators/A&P                          Patient here after falls at home.  She has PT OT starting in 3 days.  She has family at home that can help her ambulate.  I encouraged the patient to ask her family to help her get around the house until her PT arrives.  Patient is also advised to break her Metformin in half and take with the drink that tastes good.  I have also encouraged her that is important to take her medications if she is afraid of having a stroke again because it is the best way to prevent it. Final Clinical Impression(s) / ED Diagnoses Final diagnoses:  None    Rx / DC Orders ED Discharge Orders    None       Arthor Captain, PA-C 04/24/20 1247    Benjiman Core, MD 04/24/20 1635

## 2020-04-27 ENCOUNTER — Other Ambulatory Visit: Payer: Self-pay

## 2020-04-27 ENCOUNTER — Ambulatory Visit: Payer: Commercial Managed Care - PPO | Attending: Internal Medicine

## 2020-04-27 DIAGNOSIS — R2681 Unsteadiness on feet: Secondary | ICD-10-CM | POA: Diagnosis present

## 2020-04-27 DIAGNOSIS — R2689 Other abnormalities of gait and mobility: Secondary | ICD-10-CM | POA: Insufficient documentation

## 2020-04-27 DIAGNOSIS — I69354 Hemiplegia and hemiparesis following cerebral infarction affecting left non-dominant side: Secondary | ICD-10-CM

## 2020-04-27 DIAGNOSIS — R296 Repeated falls: Secondary | ICD-10-CM | POA: Diagnosis present

## 2020-04-27 NOTE — Therapy (Signed)
[facility] [address] [place], [place], [address] Phone: [phone]   Fax:  [phone]  Physical Therapy Evaluation  Patient Details  Name: Allysa Governale MRN: [id number] Date of Birth: 08-11-1981 Referring Provider (PT): Dr. Onalee Hua Tat   Encounter Date: 04/27/2020   PT End of Session - 04/27/20 1359    Visit Number 1    Number of Visits 17    Date for PT Re-Evaluation 06/22/20    Authorization Type UHC    PT Start Time 1230    PT Stop Time 1315    PT Time Calculation (min) 45 min    Equipment Utilized During Treatment Gait belt    Activity Tolerance Patient limited by fatigue    Behavior During Therapy Flat affect           Past Medical History:  Diagnosis Date  . Diabetes mellitus without complication (HCC)   . Hypertension   . TIA (transient ischemic attack) 01/22/2019    Past Surgical History:  Procedure Laterality Date  . NO PAST SURGERIES      There were no vitals filed for this visit.    Subjective Assessment - 04/27/20 1332    Subjective Pt reports that she had 3 strokes in last 6 months. First one was on 01/22/20. She was in hospital for couple of days. There was no significant weakness. Had stroke that affected left side on 04/15/20. She was in hospital for couplee of days. She was working at [employer]. After last stroke she has significant weakness in the left side of her body. She reports she had trouble swallowing metformin after last stroke but if she drinks it with milk she can swallow it better. Pt lives with mom currently. Before stroke, she was living at [facility]. Pt has 2 steps to go up on porch with rail on R going up. She is using the quad cane at home for ambulation. Pt reports that her mom helps her with shower, don/doff clothes. Pt can manage food on her own but aunt reports that she doesn't get it all in her mouth and it drips out. Pt denies any choking. Pt was working until last stroke.     Patient is accompained by: Family member   aunt   Pertinent History hx of multiple CVA, DM, HTN, drug abuse    Limitations Standing;Walking;House hold activities;Lifting    How long can you sit comfortably? no issues    How long can you stand comfortably? <30 sec    How long can you walk comfortably? <10 feet with quad cane    Patient Stated Goals Get my balance better.    Currently in Pain? No/denies              [facility] PT Assessment - 04/27/20 1258      Assessment   Medical Diagnosis CVA    Referring Provider (PT) Dr. Onalee Hua Tat    Onset Date/Surgical Date 04/15/20      Precautions   Precautions Fall      Balance Screen   Has the patient fallen in the past 6 months Yes    How many times? 7   in last 2 weeks     Home Nurse, mental health Private residence    Living Arrangements Parent   with mom   Available Help at Discharge Family    Type of Home House    Home Access Stairs to enter    Entrance Stairs-Number of Steps  2    Entrance Stairs-Rails Right    Home Layout One level    Home Equipment Cane - quad   large base     Prior Function   Level of Independence Independent    Vocation Full time employment    Vocation Requirements Worked at [employer]   Overall Cognitive Status Impaired/Different from baseline    Area of Impairment Safety/judgement;Awareness    Safety/Judgement Decreased awareness of safety;Decreased awareness of deficits    Awareness Impaired      Transfers   Transfers Sit to Stand    Sit to Stand 5: Supervision;4: Min guard    Transfer Cueing hand placement      Standardized Balance Assessment   Standardized Balance Assessment Five Times Sit to Stand;Timed Up and Go Test;Berg Balance Test    Five times sit to stand comments  1 min 15 sec      Berg Balance Test   Sit to Stand Able to stand using hands after several tries    Standing Unsupported Able to stand 30 seconds unsupported    Sitting with Back  Unsupported but Feet Supported on Floor or Stool Able to sit safely and securely 2 minutes    Stand to Sit Sits independently, has uncontrolled descent    Transfers Able to transfer with verbal cueing and /or supervision    Standing Unsupported with Eyes Closed Able to stand 3 seconds    Standing Unsupported with Feet Together Needs help to attain position and unable to hold for 15 seconds    From Standing, Reach Forward with Outstretched Arm Loses balance while trying/requires external support    From Standing Position, Pick up Object from Floor Unable to try/needs assist to keep balance    From Standing Position, Turn to Look Behind Over each Shoulder Needs assist to keep from losing balance and falling    Turn 360 Degrees Needs assistance while turning    Standing Unsupported, Alternately Place Feet on Step/Stool Needs assistance to keep from falling or unable to try    Standing Unsupported, One Foot in [employer] balance while stepping or standing    Standing on One Leg Unable to try or needs assist to prevent fall    Total Score 13                      Objective measurements completed on examination: See above findings.               PT Education - 04/27/20 1337    Education Details Pt and family educated on effects of stroke on cognition, decision making, judgement and safety awareness. Aunt educated to work with her sit to stand transfers with repetitions. Pt educated on healting times after stroke. patient educated that she should not attempt any transfers or walking without her mom being present at home next to her to reduce fal lfrequency.    Person(s) Educated Patient;Other (comment)   aunt   Methods Explanation    Comprehension Verbalized understanding            PT Short Term Goals - 04/27/20 1340      PT SHORT TERM GOAL #1   Title Patient will demo correct hand placement for standing from chair and sitting back down to improve safety awareness  with transfers    Baseline Pt is not consistent with holding chair arm rest when sitting down which causes uncontrolled descent.  Time 4    Period Weeks    Status New    Target Date 05/25/20      PT SHORT TERM GOAL #2   Title Pt will be able to ambulate 100' with quad cane to improve in home mobility with SBA    Baseline <10 feet with quad cane, min A    Time 4    Period Weeks    Status New    Target Date 05/25/20      PT SHORT TERM GOAL #3   Title Patient will be able to go up and down 2 steps with use of Rail on R to be able to go up and down porch steps.    Baseline min A    Time 4    Period Weeks    Status New    Target Date 05/25/20      PT SHORT TERM GOAL #4   Title Pt will be able to perform 5x sit to stand with one UE support in under 50 seconds to improve transfers.    Baseline 1 min 15 sec with one UE support    Time 4    Period Weeks    Status New    Target Date 05/25/20             PT Long Term Goals - 04/27/20 1343      PT LONG TERM GOAL #1   Title Patient will be able to perform Timed up and go test under <40 sec with quad cane to improve functional mobility    Baseline TBD    Time 8    Period Weeks    Status New    Target Date 06/22/20      PT LONG TERM GOAL #2   Title Patient will be able to perform 5x sit to stand in under 40 sec to improve functional strength with use of one UE    Baseline 1 min 15 sec with one UE    Time 8    Period Weeks    Status New    Target Date 06/22/20      PT LONG TERM GOAL #3   Title Pt will be able to ambulate 500' with quad cane to improve community ambulation to improve independence    Baseline <10 feet per pt report with quad cane    Time 8    Period Weeks    Target Date 06/22/20      PT LONG TERM GOAL #4   Title Patient will report no new falls in one consecutive month to reduce fall risk    Baseline 6-7 falls in last 2 weeks from eval    Time 8    Period Weeks    Status New    Target Date 06/22/20       PT LONG TERM GOAL #5   Title Pt will demonstrate 9 points of improvement on Berg balance scale to improve static balance    Baseline 13/56 (eval)    Time 8    Period Weeks    Status New    Target Date 06/22/20                  Plan - 04/27/20 1351    Clinical Impression Statement Patient is a 39 y.o. female who was seen today for physica ltherapy evaluation and treatment after recent CVA. Patient demonstrates significantly reduced functional strength and mobility. patient is currently at very high risk of fall due berg  balance scale 13/56. Patient has fallen 6-7 times in last 2 weeks. Patient will benefit from skilled PT to address these impairments and improve overall function.    Personal Factors and Comorbidities Comorbidity 3+;Past/Current Experience;Time since onset of injury/illness/exacerbation;Transportation;Behavior Pattern    Comorbidities DM, HTN, hx of multiple strokes in last 6 months    Examination-Activity Limitations Bathing;Bed Mobility;Caring for Others;Carry;Dressing;Hygiene/Grooming;Lift;Reach Overhead;Squat    Examination-Participation Restrictions Cleaning;Community Activity;Driving;Laundry;Yard Work;Occupation;Church;Meal Prep    Stability/Clinical Decision Making Evolving/Moderate complexity    Clinical Decision Making Moderate    Rehab Potential Good    PT Frequency 2x / week    PT Duration 8 weeks    PT Treatment/Interventions ADLs/Self Care Home Management;Gait training;Stair training;Functional mobility training;Therapeutic activities;Therapeutic exercise;Balance training;Neuromuscular re-education;Manual techniques;Orthotic Fit/Training;Patient/family education;Cognitive remediation;Passive range of motion;Energy conservation    PT Next Visit Plan Perform TUG, issue HEP    PT Home Exercise Plan none issued today           Patient will benefit from skilled therapeutic intervention in order to improve the following deficits and impairments:   Abnormal gait, Decreased activity tolerance, Decreased balance, Decreased cognition, Decreased coordination, Decreased endurance, Decreased knowledge of precautions, Decreased mobility, Decreased range of motion, Decreased safety awareness, Decreased strength, Difficulty walking, Impaired perceived functional ability, Impaired flexibility, Impaired sensation, Impaired tone, Impaired UE functional use  Visit Diagnosis: Hemiplegia and hemiparesis following cerebral infarction affecting left non-dominant side (HCC)  Other abnormalities of gait and mobility  Unsteadiness on feet  Repeated falls     Problem List Patient Active Problem List   Diagnosis Date Noted  . Uncontrolled type 2 diabetes mellitus with hyperglycemia, without long-term current use of insulin (HCC) 04/16/2020  . CVA (cerebral vascular accident) (HCC) 04/15/2020  . Cocaine use 04/15/2020  . Acute CVA (cerebrovascular accident) (HCC) 03/04/2020  . Stroke (cerebrum) (HCC) 03/03/2020  . Weakness   . Noncompliance with medication regimen   . TIA (transient ischemic attack) 01/22/2019  . Diabetes mellitus due to underlying condition without complications (HCC) 05/30/2014  . Essential hypertension, benign 05/30/2014  . Smoking 05/30/2014    Ileana LaddKarmesh N Tyana Butzer, PT 04/27/2020, 2:04 PM  [facility] [address] [place], [place], [address] Phone: [phone]   Fax:  [phone]  Name: Avis EpleyRuby Swiderski MRN: [id number] Date of Birth: [birth year]

## 2020-05-01 ENCOUNTER — Emergency Department (HOSPITAL_COMMUNITY)
Admission: EM | Admit: 2020-05-01 | Discharge: 2020-05-03 | Disposition: A | Discharge status: Home / Self Care | Payer: Commercial Managed Care - PPO | Attending: Emergency Medicine | Admitting: Emergency Medicine

## 2020-05-01 ENCOUNTER — Emergency Department (HOSPITAL_COMMUNITY): Payer: Commercial Managed Care - PPO

## 2020-05-01 ENCOUNTER — Ambulatory Visit: Payer: Commercial Managed Care - PPO | Admitting: Physical Therapy

## 2020-05-01 ENCOUNTER — Encounter (HOSPITAL_COMMUNITY): Payer: Self-pay | Admitting: Emergency Medicine

## 2020-05-01 ENCOUNTER — Other Ambulatory Visit: Payer: Self-pay

## 2020-05-01 DIAGNOSIS — Y939 Activity, unspecified: Secondary | ICD-10-CM | POA: Diagnosis not present

## 2020-05-01 DIAGNOSIS — I10 Essential (primary) hypertension: Secondary | ICD-10-CM | POA: Insufficient documentation

## 2020-05-01 DIAGNOSIS — Z79899 Other long term (current) drug therapy: Secondary | ICD-10-CM | POA: Diagnosis not present

## 2020-05-01 DIAGNOSIS — Z20822 Contact with and (suspected) exposure to covid-19: Secondary | ICD-10-CM | POA: Diagnosis not present

## 2020-05-01 DIAGNOSIS — Y929 Unspecified place or not applicable: Secondary | ICD-10-CM | POA: Insufficient documentation

## 2020-05-01 DIAGNOSIS — S82831A Other fracture of upper and lower end of right fibula, initial encounter for closed fracture: Secondary | ICD-10-CM | POA: Diagnosis not present

## 2020-05-01 DIAGNOSIS — F1590 Other stimulant use, unspecified, uncomplicated: Secondary | ICD-10-CM | POA: Insufficient documentation

## 2020-05-01 DIAGNOSIS — X58XXXA Exposure to other specified factors, initial encounter: Secondary | ICD-10-CM | POA: Diagnosis not present

## 2020-05-01 DIAGNOSIS — R262 Difficulty in walking, not elsewhere classified: Secondary | ICD-10-CM | POA: Diagnosis not present

## 2020-05-01 DIAGNOSIS — S82401A Unspecified fracture of shaft of right fibula, initial encounter for closed fracture: Secondary | ICD-10-CM | POA: Diagnosis present

## 2020-05-01 DIAGNOSIS — Z7984 Long term (current) use of oral hypoglycemic drugs: Secondary | ICD-10-CM | POA: Diagnosis not present

## 2020-05-01 DIAGNOSIS — Z7982 Long term (current) use of aspirin: Secondary | ICD-10-CM | POA: Diagnosis not present

## 2020-05-01 DIAGNOSIS — F17210 Nicotine dependence, cigarettes, uncomplicated: Secondary | ICD-10-CM | POA: Insufficient documentation

## 2020-05-01 DIAGNOSIS — E119 Type 2 diabetes mellitus without complications: Secondary | ICD-10-CM | POA: Insufficient documentation

## 2020-05-01 DIAGNOSIS — Y999 Unspecified external cause status: Secondary | ICD-10-CM | POA: Insufficient documentation

## 2020-05-01 LAB — SARS CORONAVIRUS 2 BY RT PCR (HOSPITAL ORDER, PERFORMED IN ~~LOC~~ HOSPITAL LAB): SARS Coronavirus 2: NEGATIVE

## 2020-05-01 MED ORDER — ATORVASTATIN CALCIUM 40 MG PO TABS
40.0000 mg | ORAL_TABLET | Freq: Every day | ORAL | Status: DC
  Administered 2020-05-01 – 2020-05-02 (×2): 40 mg via ORAL
  Filled 2020-05-01 (×2): qty 1

## 2020-05-01 MED ORDER — HYDROCHLOROTHIAZIDE 12.5 MG PO CAPS
12.5000 mg | ORAL_CAPSULE | Freq: Every day | ORAL | Status: DC
  Administered 2020-05-01 – 2020-05-03 (×3): 12.5 mg via ORAL
  Filled 2020-05-01 (×3): qty 1

## 2020-05-01 MED ORDER — GLIPIZIDE 5 MG PO TABS
5.0000 mg | ORAL_TABLET | Freq: Two times a day (BID) | ORAL | Status: DC
  Administered 2020-05-02 – 2020-05-03 (×3): 5 mg via ORAL
  Filled 2020-05-01 (×7): qty 1

## 2020-05-01 MED ORDER — LISINOPRIL 20 MG PO TABS
20.0000 mg | ORAL_TABLET | Freq: Every day | ORAL | Status: DC
  Administered 2020-05-01 – 2020-05-03 (×3): 20 mg via ORAL
  Filled 2020-05-01 (×3): qty 1

## 2020-05-01 MED ORDER — ASPIRIN EC 81 MG PO TBEC
81.0000 mg | DELAYED_RELEASE_TABLET | Freq: Every day | ORAL | Status: DC
  Administered 2020-05-01 – 2020-05-03 (×3): 81 mg via ORAL
  Filled 2020-05-01 (×3): qty 1

## 2020-05-01 MED ORDER — CLOPIDOGREL BISULFATE 75 MG PO TABS
75.0000 mg | ORAL_TABLET | Freq: Every day | ORAL | Status: DC
  Administered 2020-05-01 – 2020-05-03 (×3): 75 mg via ORAL
  Filled 2020-05-01 (×3): qty 1

## 2020-05-01 MED ORDER — METFORMIN HCL 500 MG PO TABS
1000.0000 mg | ORAL_TABLET | Freq: Two times a day (BID) | ORAL | Status: DC
  Administered 2020-05-01 – 2020-05-03 (×4): 1000 mg via ORAL
  Filled 2020-05-01 (×4): qty 2

## 2020-05-01 NOTE — ED Notes (Signed)
Pt arrived to Rm [facility] via w/c - Cam walker noted to left foot. Pt has purse on bed w/her. Home meds on counter.

## 2020-05-01 NOTE — ED Triage Notes (Signed)
Pt arrives from home via [facility] with c.o of left ankle pain and swelling since Saturday. Pt denies any injury but pt was seen here on 7/27 for frequent falls related to her recent stroke which left her with left sided weakness currently in rehab for this.

## 2020-05-01 NOTE — ED Notes (Signed)
Pt assisted to bedside commode, pt is heavy 2 assist with no movement or effort on L side. Pt changed into hospital gown, linens changed.  Pt provided remote to TV, call bell within reach.

## 2020-05-01 NOTE — ED Provider Notes (Signed)
[facility] EMERGENCY DEPARTMENT Provider Note   CSN: [id number] Arrival date & time: 05/01/20  1062     History Chief Complaint  Patient presents with  . Ankle Pain    Ebony Matthews is a 39 y.o. female with past medical history of multiple CVAs, left-sided weakness.  She was discharged on 7/19 for recurrent CVA.  She was seen by PT and evaluated felt to be safe to go home.  She has been having outpatient physical therapy at neuro rehabilitation however patient has had 2 ER visits for falls at home.  She is living with her aunt.  She states that her aunt has been trying to help her get up out of the bed every time however she is really unable to bear any weight.  2 days ago she fell and hit her leg against her bed trying to transition to her bedside commode.  She has had persistent and severe left ankle pain since that time with swelling.  She was supposed to go to therapy today but was unable to get to the car to go and EMS brought her to the emergency department.  HPI     Past Medical History:  Diagnosis Date  . Diabetes mellitus without complication (HCC)   . Hypertension   . TIA (transient ischemic attack) 01/22/2019    Patient Active Problem List   Diagnosis Date Noted  . Uncontrolled type 2 diabetes mellitus with hyperglycemia, without long-term current use of insulin (HCC) 04/16/2020  . CVA (cerebral vascular accident) (HCC) 04/15/2020  . Cocaine use 04/15/2020  . Acute CVA (cerebrovascular accident) (HCC) 03/04/2020  . Stroke (cerebrum) (HCC) 03/03/2020  . Weakness   . Noncompliance with medication regimen   . TIA (transient ischemic attack) 01/22/2019  . Diabetes mellitus due to underlying condition without complications (HCC) 05/30/2014  . Essential hypertension, benign 05/30/2014  . Smoking 05/30/2014    Past Surgical History:  Procedure Laterality Date  . NO PAST SURGERIES       OB History    Gravida  1   Para      Term       Preterm      AB  1   Living        SAB  1   TAB      Ectopic      Multiple      Live Births              Family History  Problem Relation Age of Onset  . Diabetes Other   . Hypertension Maternal Grandmother     Social History   Tobacco Use  . Smoking status: Current Every Day Smoker    Packs/day: 1.00    Years: 15.00    Pack years: 15.00  . Smokeless tobacco: Never Used  Vaping Use  . Vaping Use: Never used  Substance Use Topics  . Alcohol use: No  . Drug use: Yes    Types: Marijuana    Comment: occasional    Home Medications Prior to Admission medications   Medication Sig Start Date End Date Taking? Authorizing Provider  aspirin EC 81 MG tablet Take 1 tablet (81 mg total) by mouth daily. Patient not taking: Reported on 04/15/2020 03/04/20 03/04/21  Lurene Shadow, MD  atorvastatin (LIPITOR) 80 MG tablet Take 0.5 tablets (40 mg total) by mouth daily at 6 PM. 03/04/20   Lurene Shadow, MD  clopidogrel (PLAVIX) 75 MG tablet Take 1 tablet (75 mg total) by  mouth daily. X 21 days 04/16/20   Catarina Hartshorn, MD  glipiZIDE (GLUCOTROL) 5 MG tablet Take 1 tablet (5 mg total) by mouth 2 (two) times daily before a meal. Patient not taking: Reported on 04/15/2020 03/04/20   Lurene Shadow, MD  hydrochlorothiazide (MICROZIDE) 12.5 MG capsule Take 1 capsule (12.5 mg total) by mouth daily. Patient not taking: Reported on 04/15/2020 03/04/20   Lurene Shadow, MD  lisinopril (ZESTRIL) 20 MG tablet Take 1 tablet (20 mg total) by mouth daily. 03/04/20 04/15/20  Lurene Shadow, MD  metFORMIN (GLUCOPHAGE) 1000 MG tablet Take 1 tablet (1,000 mg total) by mouth 2 (two) times daily. 03/04/20   Lurene Shadow, MD    Allergies    Patient has no known allergies.  Review of Systems   Review of Systems Ten systems reviewed and are negative for acute change, except as noted in the HPI.   Physical Exam Updated Vital Signs BP (!) 145/85 (BP Location: Right Arm)   Pulse 98   Temp 98.4 F (36.9 C)  (Oral)   Resp 20   SpO2 100%   Physical Exam Physical Exam  Nursing note and vitals reviewed. Constitutional: She is oriented to person, place, and time. She appears well-developed and well-nourished. No distress.  HENT:  Head: Normocephalic and atraumatic.  Eyes: Conjunctivae normal and EOM are normal. Pupils are equal, round, and reactive to light. No scleral icterus.  Neck: Normal range of motion.  Cardiovascular: Normal rate, regular rhythm and normal heart sounds.  Exam reveals no gallop and no friction rub.   No murmur heard. Pulmonary/Chest: Effort normal and breath sounds normal. No respiratory distress.  Abdominal: Soft. Bowel sounds are normal. She exhibits no distension and no mass. There is no tenderness. There is no guarding.  Musculoskeletal: Left foot and ankle with significant swelling, tenderness over the proximal fibular region. NVI Neurological: Left upper and lower extremity weakness skin: Skin is warm and dry. She is not diaphoretic.    ED Results / Procedures / Treatments   Labs (all labs ordered are listed, but only abnormal results are displayed) Labs Reviewed - No data to display  EKG None  Radiology DG Ankle Complete Left  Result Date: 05/01/2020 CLINICAL DATA:  LEFT ankle pain and swelling EXAM: LEFT ANKLE COMPLETE - 3+ VIEW; LEFT FOOT - COMPLETE 3+ VIEW COMPARISON:  None. FINDINGS: There is a nondisplaced transverse fracture of the distal fibula. There is adjacent soft tissue swelling. Ankle mortise is preserved. Tiny enthesophyte of the Achilles tendon. Scattered midfoot degenerative changes, mild. IMPRESSION: Nondisplaced transverse fracture of the distal fibula with adjacent soft tissue swelling. Electronically Signed   By: Meda Klinefelter MD   On: 05/01/2020 10:15   DG Foot Complete Left  Result Date: 05/01/2020 CLINICAL DATA:  LEFT ankle pain and swelling EXAM: LEFT ANKLE COMPLETE - 3+ VIEW; LEFT FOOT - COMPLETE 3+ VIEW COMPARISON:  None. FINDINGS:  There is a nondisplaced transverse fracture of the distal fibula. There is adjacent soft tissue swelling. Ankle mortise is preserved. Tiny enthesophyte of the Achilles tendon. Scattered midfoot degenerative changes, mild. IMPRESSION: Nondisplaced transverse fracture of the distal fibula with adjacent soft tissue swelling. Electronically Signed   By: Meda Klinefelter MD   On: 05/01/2020 10:15    Procedures Procedures (including critical care time)  Medications Ordered in ED Medications - No data to display  ED Course  I have reviewed the triage vital signs and the nursing notes.  Pertinent labs & imaging results that were available  during my care of the patient were reviewed by me and considered in my medical decision making (see chart for details).    MDM Rules/Calculators/A&P                          Patient here with multiple falls at home.  She has been to the ER twice for falls.  I personally reviewed images of the patient's triage ordered ankle and foot plain films which show a stable distal left fibular fracture. Patient placed in a cam walker boot.  I have ordered PT OT evaluation as the patient now has a left ankle fracture with persistent left-sided hemiparesis and multiple outpatient falls at home. She will not be able to use crutches. Patient states that she feels like she needs more intensive inpatient care because she is doing poorly at home.  3:18 PM BP (!) 145/85 (BP Location: Right Arm)   Pulse 98   Temp 98.4 F (36.9 C) (Oral)   Resp 20   SpO2 100%  Patient seen and evaluated by physical therapy.  She is requiring 2 person assist and is unsafe for discharge home.  I have contacted our case manager and they will attempt to have her placed in skilled nursing facility.  I have ordered the patient's home medications and diet.  Final Clinical Impression(s) / ED Diagnoses Final diagnoses:  Other closed fracture of distal end of right fibula, initial encounter  Unable  to ambulate    Rx / DC Orders ED Discharge Orders    None       Arthor Captain, PA-C 05/01/20 2012    Terrilee Files, MD 05/02/20 0730

## 2020-05-01 NOTE — NC FL2 (Addendum)
[place] MEDICAID FL2 LEVEL OF CARE SCREENING TOOL     IDENTIFICATION  Patient Name: Ebony Matthews Birthdate: 03/12/1981 Sex: female Admission Date (Current Location): 05/01/2020  [facility] and [place] Number:  Producer, television/film/video and Address:  [facility], [address], [place], [place] [address]      Provider Number: [id number]  Attending Physician Name and Address:  Terrilee Files, MD  Relative Name and Phone Number:  Javana, Schey [phone]    Current Level of Care: Hospital Recommended Level of Care: Skilled Nursing Facility Prior Approval Number:    Date Approved/Denied:   PASRR Number: [id number]  Discharge Plan: SNF    Current Diagnoses: Patient Active Problem List   Diagnosis Date Noted  . Uncontrolled type 2 diabetes mellitus with hyperglycemia, without long-term current use of insulin (HCC) 04/16/2020  . CVA (cerebral vascular accident) (HCC) 04/15/2020  . Cocaine use 04/15/2020  . Acute CVA (cerebrovascular accident) (HCC) 03/04/2020  . Stroke (cerebrum) (HCC) 03/03/2020  . Weakness   . Noncompliance with medication regimen   . TIA (transient ischemic attack) 01/22/2019  . Diabetes mellitus due to underlying condition without complications (HCC) 05/30/2014  . Essential hypertension, benign 05/30/2014  . Smoking 05/30/2014    Orientation RESPIRATION BLADDER Height & Weight     Self, Time, Situation, Place  Normal Continent Weight:   Height:     BEHAVIORAL SYMPTOMS/MOOD NEUROLOGICAL BOWEL NUTRITION STATUS      Continent Diet (heart healthy)  AMBULATORY STATUS COMMUNICATION OF NEEDS Skin   Extensive Assist Verbally Normal                       Personal Care Assistance Level of Assistance  Bathing, Feeding, Dressing Bathing Assistance: Limited assistance Feeding assistance: Independent Dressing Assistance: Limited assistance     Functional Limitations Info  Speech, Sight, Hearing Sight Info:  Adequate Hearing Info: Adequate Speech Info: Adequate    SPECIAL CARE FACTORS FREQUENCY                       Contractures Contractures Info: Not present    Additional Factors Info  Code Status Code Status Info: Full             Current Medications (05/01/2020):  This is the current hospital active medication list Current Facility-Administered Medications  Medication Dose Route Frequency Provider Last Rate Last Admin  . aspirin EC tablet 81 mg  81 mg Oral Daily Arthor Captain, PA-C   81 mg at 05/01/20 1810  . atorvastatin (LIPITOR) tablet 40 mg  40 mg Oral q1800 Harris, Cammy Copa, PA-C   40 mg at 05/01/20 1810  . clopidogrel (PLAVIX) tablet 75 mg  75 mg Oral Daily Arthor Captain, PA-C   75 mg at 05/01/20 1810  . glipiZIDE (GLUCOTROL) tablet 5 mg  5 mg Oral BID AC Harris, Abigail, PA-C      . hydrochlorothiazide (MICROZIDE) capsule 12.5 mg  12.5 mg Oral Daily Harris, Abigail, PA-C   12.5 mg at 05/01/20 1810  . lisinopril (ZESTRIL) tablet 20 mg  20 mg Oral Daily Arthor Captain, PA-C   20 mg at 05/01/20 1809  . metFORMIN (GLUCOPHAGE) tablet 1,000 mg  1,000 mg Oral BID Arthor Captain, PA-C       Current Outpatient Medications  Medication Sig Dispense Refill  . aspirin EC 81 MG tablet Take 1 tablet (81 mg total) by mouth daily.    Marland Kitchen atorvastatin (LIPITOR) 40 MG  tablet Take 40 mg by mouth at bedtime.    . clopidogrel (PLAVIX) 75 MG tablet Take 1 tablet (75 mg total) by mouth daily. X 21 days 21 tablet 0  . glipiZIDE (GLUCOTROL) 5 MG tablet Take 1 tablet (5 mg total) by mouth 2 (two) times daily before a meal. 60 tablet 0  . hydrochlorothiazide (MICROZIDE) 12.5 MG capsule Take 1 capsule (12.5 mg total) by mouth daily. 30 capsule 0  . lisinopril (ZESTRIL) 20 MG tablet Take 1 tablet (20 mg total) by mouth daily. 30 tablet 0  . metFORMIN (GLUCOPHAGE) 1000 MG tablet Take 1 tablet (1,000 mg total) by mouth 2 (two) times daily. 60 tablet 0  . naproxen sodium (ALEVE) 220 MG tablet  Take 220-440 mg by mouth 2 (two) times daily as needed (for pain).    Marland Kitchen atorvastatin (LIPITOR) 80 MG tablet Take 0.5 tablets (40 mg total) by mouth daily at 6 PM. (Patient not taking: Reported on 05/01/2020) 30 tablet 0     Discharge Medications: Please see discharge summary for a list of discharge medications.  Relevant Imaging Results:  Relevant Lab Results:   Additional Information ssn# [id number]  Shella Spearing, LCSW

## 2020-05-01 NOTE — TOC Initial Note (Signed)
Transition of Care [facility]) - Initial/Assessment Note    Patient Details  Name: Ebony Matthews MRN: [id number] Date of Birth: Aug 19, 1981  Transition of [facility]) CM/SW Contact:    Vergie Living, LCSW Phone Number: 05/01/2020, 7:11 PM  Clinical Narrative:     CSW met with Pt at bedside.  Pt is A&Ox4.  Pt recently had  A stroke and is struggling with the emotions of that.  CSW was able to utilize clinical skills to aid Pt in process and engage in strength-based perspective and encourage Pt to evaluate personal strengths as she moves through physical recovery journey.  CSW discussed SNF placement with Pt and Pt agreed, CSW will send Pt info to [facility] area in an effort to maximize bed offers.           Expected Discharge Plan: Skilled Nursing Facility Barriers to Discharge: No Barriers Identified   Patient Goals and CMS Choice Patient states their goals for this hospitalization and ongoing recovery are:: Keep balance/be able to stand up CMS Medicare.gov Compare Post Acute Care list provided to:: Patient Choice offered to / list presented to : Patient  Expected Discharge Plan and Services Expected Discharge Plan: [place] arrangements for the past 2 months: Single Family Home                                      Prior Living Arrangements/Services Living arrangements for the past 2 months: Single Family Home 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, Relatives Engineer, petroleum) Patient language and need for interpreter reviewed:: No Do you feel safe going back to the place where you live?: Yes      Need for Family Participation in Patient Care: Yes (Comment) Care giver support system in place?: Yes (comment) (Live with Aunt Zuelke,Betty)   Criminal Activity/Legal Involvement Pertinent to Current Situation/Hospitalization: No - Comment as needed  Activities of Daily Living      Permission Sought/Granted Permission sought to share information with :  Facility Sport and exercise psychologist, Family Supports Permission granted to share information with : Yes, Verbal Permission Granted  Share Information with NAME: Kamoni, Depree [phone]     Permission granted to share info w Relationship: Nakiesha, Rumsey [phone]  Permission granted to share info w Contact Information: Teresha, Hanks [phone]  Emotional Assessment Appearance:: Appears stated age   Affect (typically observed): Tearful/Crying Orientation: : Oriented to Self, Oriented to Place, Oriented to  Time, Oriented to Situation Alcohol / Substance Use: Not Applicable Psych Involvement: No (comment)  Admission diagnosis:  ANKLE PAIN Patient Active Problem List   Diagnosis Date Noted  . Uncontrolled type 2 diabetes mellitus with hyperglycemia, without long-term current use of insulin ([place]) 04/16/2020  . CVA (cerebral vascular accident) ([place]) 04/15/2020  . Cocaine use 04/15/2020  . Acute CVA (cerebrovascular accident) ([place]) 03/04/2020  . Stroke (cerebrum) ([place]) 03/03/2020  . Weakness   . Noncompliance with medication regimen   . TIA (transient ischemic attack) 01/22/2019  . Diabetes mellitus due to underlying condition without complications ([place]) 29/93/7169  . Essential hypertension, benign 05/30/2014  . Smoking 05/30/2014   PCP:  Jordan Hawks, NP Pharmacy:   [facility] ([address]), [place] - [address] [place] ([place] [address] Phone: [phone] Fax: [phone]     Social Determinants of Health (SDOH) Interventions    Readmission Risk Interventions No flowsheet data  found.  

## 2020-05-01 NOTE — ED Notes (Addendum)
PT was called to come see patient . Ortho was called to come see patient.

## 2020-05-01 NOTE — ED Notes (Signed)
SW in w/pt. 

## 2020-05-01 NOTE — Evaluation (Signed)
Physical Therapy Evaluation Patient Details Name: Ebony Matthews MRN: [id number] DOB: Feb 14, 1981 Today's Date: 05/01/2020   History of Present Illness  Pt is a 39 y/o female presenting to the ED secondary to L ankle pain. Reports she hit it on her bed rail during transfer. Found to have L fibular fx. PMH includes CVA, DM, HTN, and polysubstance abuse.   Clinical Impression  Pt presenting with problem above with deficits below. Pt very impulsive and required multiple safety cues throughout. PT had to prevent pt from sliding off of stretcher chair. Pt also requiring mod to max A +2 to stand and pivot to and from [facility]. Pt currently from home with mother, but has had multiple falls at home. Feel pt would benefit from SNF level therapies at d/c to increase safety with mobility. Will continue to follow acutely.     Follow Up Recommendations SNF;Supervision/Assistance - 24 hour    Equipment Recommendations  None recommended by PT    Recommendations for Other Services       Precautions / Restrictions Precautions Precautions: Fall Precaution Comments: Pt has had multiple falls at home.  Restrictions Weight Bearing Restrictions: Yes LLE Weight Bearing: Weight bearing as tolerated Other Position/Activity Restrictions: WBAT in boot per PA       Mobility  Bed Mobility Overal bed mobility: Needs Assistance             General bed mobility comments: Pt sitting at edge of stretcher. Required max A +2 to get back up on stretcher chair.   Transfers Overall transfer level: Needs assistance Equipment used: None Transfers: Stand Pivot Transfers;Sit to/from Stand Sit to Stand: Max assist;+2 physical assistance Stand pivot transfers: Max assist;Mod assist;+2 physical assistance       General transfer comment: Required mod to max A +2 to stand and pivot to and from [facility]. PT had to manually block LLE to prevent buckling and had to physically assist to move LLE to take steps.    Ambulation/Gait             General Gait Details: Unable   Stairs            Wheelchair Mobility    Modified Rankin (Stroke Patients Only)       Balance Overall balance assessment: Needs assistance Sitting-balance support: Feet supported Sitting balance-Leahy Scale: Fair     Standing balance support: No upper extremity supported;Single extremity supported;During functional activity Standing balance-Leahy Scale: Poor Standing balance comment: Reliant on external assist to maintain balance.                              Pertinent Vitals/Pain Pain Assessment: Faces Faces Pain Scale: Hurts a little bit Pain Location: L foot  Pain Descriptors / Indicators: Grimacing;Guarding Pain Intervention(s): Limited activity within patient's tolerance;Monitored during session;Repositioned    Home Living Family/patient expects to be discharged to:: Private residence Living Arrangements: Parent Available Help at Discharge: Available 24 hours/day Type of Home: House Home Access: Stairs to enter   Secretary/administrator of Steps: 2 Home Layout: One level Home Equipment: Wheelchair - [employer] - 2 wheels      Prior Function Level of Independence: Needs assistance   Gait / Transfers Assistance Needed: Reports mother assisted with transfers. Has been using WC for most of mobility  ADL's / Homemaking Assistance Needed: Needs assist with transfers to [facility]. Reports mother helps to sponge bathe.         Hand Dominance  Extremity/Trunk Assessment   Upper Extremity Assessment Upper Extremity Assessment: LUE deficits/detail LUE Deficits / Details: LUE flacid. No AROM.     Lower Extremity Assessment Lower Extremity Assessment: LLE deficits/detail LLE Deficits / Details: No notable AROM during transfers. PT had to block RLE during all mobility secondary to buckling.  LLE Coordination: decreased fine motor;decreased gross motor    Cervical / Trunk  Assessment Cervical / Trunk Assessment: Normal  Communication   Communication: No difficulties  Cognition Arousal/Alertness: Awake/alert Behavior During Therapy: Impulsive;Flat affect Overall Cognitive Status: No family/caregiver present to determine baseline cognitive functioning                                 General Comments: Pt very impulsive; attempted to get up without PT being ready and PT had to provide max A to avoid sliding off of stretcher. Pt putting shoe on wrong foot despite cues that she had the wrong side shoe.       General Comments      Exercises     Assessment/Plan    PT Assessment Patient needs continued PT services  PT Problem List Decreased strength;Decreased balance;Decreased mobility;Decreased knowledge of use of DME;Decreased cognition;Decreased safety awareness;Decreased knowledge of precautions       PT Treatment Interventions DME instruction;Functional mobility training;Therapeutic activities;Therapeutic exercise;Balance training;Neuromuscular re-education;Patient/family education    PT Goals (Current goals can be found in the Care Plan section)  Acute Rehab PT Goals Patient Stated Goal: none stated PT Goal Formulation: With patient Time For Goal Achievement: 05/15/20 Potential to Achieve Goals: Fair    Frequency Min 2X/week   Barriers to discharge        Co-evaluation               AM-PAC PT "6 Clicks" Mobility  Outcome Measure Help needed turning from your back to your side while in a flat bed without using bedrails?: A Lot Help needed moving from lying on your back to sitting on the side of a flat bed without using bedrails?: A Lot Help needed moving to and from a bed to a chair (including a wheelchair)?: A Lot Help needed standing up from a chair using your arms (e.g., wheelchair or bedside chair)?: A Lot Help needed to walk in hospital room?: Total Help needed climbing 3-5 steps with a railing? : Total 6 Click  Score: 10    End of Session Equipment Utilized During Treatment: Gait belt Activity Tolerance: Patient tolerated treatment well Patient left: with call bell/phone within reach;in chair (on stretcher chair in ED ) Nurse Communication: Mobility status PT Visit Diagnosis: Unsteadiness on feet (R26.81);Muscle weakness (generalized) (M62.81);Difficulty in walking, not elsewhere classified (R26.2)    Time: 6659-9357 PT Time Calculation (min) (ACUTE ONLY): 23 min   Charges:   PT Evaluation $PT Eval Moderate Complexity: 1 Mod PT Treatments $Therapeutic Activity: 8-22 mins        Cindee Salt, DPT  Acute Rehabilitation Services  Pager: [phone] Office: [phone]   Ebony Matthews 05/01/2020, 4:23 PM

## 2020-05-01 NOTE — Progress Notes (Signed)
Orthopedic Tech Progress Note Patient Details:  Iviana Blasingame [birth year] [id number]  Ortho Devices Type of Ortho Device: CAM walker Ortho Device/Splint Location: LLE Ortho Device/Splint Interventions: Ordered, Adjustment, Application   Post Interventions Patient Tolerated: Well Instructions Provided: Care of device, Adjustment of device, Poper ambulation with device   Oshea Percival 05/01/2020, 1:33 PM

## 2020-05-01 NOTE — Progress Notes (Signed)
CSW axed Pt info to several area SNFs

## 2020-05-02 ENCOUNTER — Emergency Department: Payer: Commercial Managed Care - PPO

## 2020-05-02 DIAGNOSIS — Z23 Encounter for immunization: Secondary | ICD-10-CM

## 2020-05-02 NOTE — ED Notes (Signed)
Pt linens/bedding changed

## 2020-05-02 NOTE — Progress Notes (Signed)
   Covid-19 Vaccination Clinic  Name:  Ebony Matthews    MRN: [id number] DOB: [birth year]  05/02/2020  [name] was observed post Covid-19 immunization for 15 minutes without incident. She was provided with Vaccine Information Sheet and instruction to access the V-Safe system.   [name] was instructed to call 911 with any severe reactions post vaccine: Marland Kitchen Difficulty breathing  . Swelling of face and throat  . A fast heartbeat  . A bad rash all over body  . Dizziness and weakness   Immunizations Administered    Name Date Dose VIS Date Route   JANSSEN COVID-19 VACCINE 05/02/2020 12:52 PM 0.5 mL 11/26/2019 Intramuscular   Manufacturer: [employer]   Lot: [id number]   NDC: 48250-037-04

## 2020-05-02 NOTE — ED Notes (Signed)
Pt resting on stretcher with eyes closed, RR even and unlabored, NAD. Call bell within reach.

## 2020-05-02 NOTE — ED Notes (Signed)
Ordered Breakfast--Ebony Matthews  

## 2020-05-02 NOTE — Discharge Planning (Signed)
Licensed Clinical Social Worker is seeking post-discharge placement for this patient at the following level of care: Skilled Nursing Facility.  ° °

## 2020-05-02 NOTE — ED Provider Notes (Signed)
Emergency Medicine Observation Re-evaluation Note  Ebony Matthews is a 39 y.o. female, seen on rounds today.  Pt initially presented to the ED for complaints of recurrent falls in the setting of recent stroke.  PT saw patient yesterday and recommended SNF placement.  Currently, the patient is lying in bed calmly, denies any complaints this morning.  Physical Exam  BP 120/73 (BP Location: Right Arm)   Pulse 95   Temp 97.8 F (36.6 C) (Oral)   Resp 18   SpO2 100%  Physical Exam Vitals and nursing note reviewed.  Constitutional:      General: She is not in acute distress.    Appearance: She is well-developed. She is not diaphoretic.  HENT:     Head: Normocephalic and atraumatic.  Eyes:     General:        Right eye: No discharge.        Left eye: No discharge.  Pulmonary:     Effort: Pulmonary effort is normal. No respiratory distress.  Musculoskeletal:     Comments: CAM Walker boot in place  Neurological:     Mental Status: She is alert.     Coordination: Coordination normal.  Psychiatric:        Behavior: Behavior normal.     ED Course / MDM   Labs Reviewed  SARS CORONAVIRUS 2 BY RT PCR (HOSPITAL ORDER, PERFORMED IN [facility] LAB)   I have reviewed the labs performed to date as well as medications administered while in observation.  No recent changes in the last 24 hours, pt receiving regular home medications.  Plan  Current plan is for SNF placement, per social work notes pt has been offered a bed a [employer] authorization.    Dartha Lodge, PA-C 05/02/20 1136    Tegeler, Canary Brim, MD 05/02/20 [phone]

## 2020-05-02 NOTE — ED Notes (Signed)
Lunch Tray Ordered @ 1059.  

## 2020-05-02 NOTE — Progress Notes (Addendum)
11am: CSW notified Verdon Cummins, [facility] supervisor of the patient's desire to get a COVID vaccine here at the hospital.  10am: CSW spoke with patient to present her with bed offers from [employer], [employer], and [place]. Patient requested CSW contact her niece to discuss the bed offers, patient is agreeable to whichever her aunt chooses.  CSW spoke with [name], the patient's aunt to discuss bed offers - [name] has accepted the bed offer from [facility].  CSW notified Velna Hatchet at [facility] of acceptance of bed offer, Velna Hatchet to [employer] authorization.  RN informed CSW that the patient does not yet have her COVID vaccine but is interested in getting one prior to her discharge to the facility.  Edwin Dada, MSW, LCSW-A Transitions of Care  Clinical Social Worker  [facility] Emergency Departments  Medical ICU [phone]

## 2020-05-03 NOTE — ED Notes (Signed)
PTAR CALLED BY DEE PT IS NUMBER [phone] ON THE LIST TO BE PICK UP

## 2020-05-03 NOTE — ED Notes (Signed)
Lunch Tray Ordered @ 1049. °

## 2020-05-03 NOTE — ED Notes (Signed)
RN answered patient call bell; Pt was found sitting on the floor on the side of the bed; Pt states she was trying to use the bedside commode; Pt states she pushed call bell after fall; Patient's left leg was bent backwards and patient c/o left knee pain from a fall "in the streets". RN advised patient that for safety will place a per wick on and she will need to call for the bedpan for bm; Pt voiced understanding; staff assisted RN to getting patient up off the floor and onto bedside commode for this time; EDP notified of fall-Monique,RN

## 2020-05-03 NOTE — ED Notes (Addendum)
Hourly rounding: patient is removing her ankle boot, stating "I just don't want it." Education provided Mother at bedside

## 2020-05-03 NOTE — Progress Notes (Addendum)
1pm: CSW received return call from [place] at [facility] who states this patient's [facility] plan does not have any SNF benefits.  TOC RN CM to attempt to locate an agency that can serve the patient with home health services.  9:30am: CSW spoke with Velna Hatchet at [facility] who states she is waiting for a return call from [employer] regarding this patient's insurance benefits - Velna Hatchet to call CSW once additional information is available.  Edwin Dada, MSW, LCSW-A Transitions of Care  Clinical Social Worker  [facility] Emergency Departments  Medical ICU [phone]

## 2020-05-03 NOTE — Discharge Instructions (Addendum)
Please use the resources provided by social work for further establishment of physical therapy. Return to the ER if your symptoms worse. Please call the orthopedic doctor to schedule a followup appointment for your broken foot.

## 2020-05-03 NOTE — ED Notes (Signed)
Patient advised RN that family member will be here  In about 5 minutes to pick her up; Patient assisted with getting dressed and placed in wheelchair to wait for family arrival-Monique,RN

## 2020-05-03 NOTE — ED Notes (Signed)
Patient insist she needs to walk to the bathroom PT is advised that she needs to use a wheelchair with assistance PT insists she can walk without assistance  Bed is in lowest position Bed alarm on Call light with patient Education provided

## 2020-05-03 NOTE — Care Management (Signed)
ED CM and CSW met with patient at bedside to explained that it was determined that  her insurance policy does not cover rehab. Ebony Matthews has rescinded bed offer based on this determination ED CM attempted to contact patient's Aunt Inez Catalina unable to get call to go thru, CM will make a second attempt.

## 2020-05-03 NOTE — ED Notes (Signed)
Changed pt's entire linen after pt urinated with assistance from tech. Gave pt remote and call bell and told to call if needing anything

## 2020-05-03 NOTE — ED Provider Notes (Addendum)
Emergency Medicine Observation Re-evaluation Note  Ebony Matthews is a 39 y.o. female, seen on rounds today.  Pt initially presented to the ED for complaints of Ankle Pain Pt was having current falls in the setting of a recent stroke. Currently, the patient is sleeping in her bed.  Physical Exam  BP 110/66 (BP Location: Left Arm)   Pulse 60   Temp 98 F (36.7 C) (Oral)   Resp 18   SpO2 97%  Physical Exam Vitals reviewed.  Constitutional:      Appearance: Normal appearance.  HENT:     Head: Normocephalic and atraumatic.  Eyes:     General:        Right eye: No discharge.        Left eye: No discharge.     Extraocular Movements: Extraocular movements intact.     Conjunctiva/sclera: Conjunctivae normal.  Musculoskeletal:        General: No swelling. Normal range of motion.     Comments: CAM walker in place   Neurological:     General: No focal deficit present.     Mental Status: She is alert and oriented to person, place, and time.  Psychiatric:        Mood and Affect: Mood normal.        Behavior: Behavior normal.     ED Course / MDM  EKG:    I have reviewed the labs performed to date as well as medications administered while in observation.  Recent changes in the last 24 hours include none. Plan  Current plan is for placement at [facility].  Awaiting call from [place] healthcare regarding patient's benefits. Patient is not under full IVC at this time.   5:18 PM: Contacted by patient's RN [phone].  Patient's insurance does not cover SNF placement.  Patient will be discharged with home health and PT.  Patient will be transferred via PTR.    Mare Ferrari, PA-C 05/03/20 1719    Arby Barrette, MD 05/04/20 1537

## 2020-05-03 NOTE — Progress Notes (Signed)
05/03/2020  [facility] [address] [place], [place], [address] Phone: [phone]   Fax:  [phone]   Physical Therapy Discharge Note  Patient Details  Name: Ebony Matthews MRN: [id number] Date of Birth: [birth year] Referring Provider (PT): Dr. Onalee Hua Tat  Dr. Arbutus Leas,  Patient recently fell and broke her left distal fibula while performing transfers at home on 05/01/20. Due to medical status change, patient will be discharged from current course of physical therapy and will need a new outpatient PT referral to continue PT.   Lavone Nian, PT 05/03/2020

## 2020-05-03 NOTE — ED Notes (Signed)
Pt secure in bed, assisted with placement in bed. Told pt to call if they needed anything

## 2020-05-04 ENCOUNTER — Ambulatory Visit: Payer: Commercial Managed Care - PPO | Admitting: Physical Therapy

## 2020-05-07 ENCOUNTER — Ambulatory Visit: Payer: Commercial Managed Care - PPO

## 2020-05-11 ENCOUNTER — Ambulatory Visit: Payer: Commercial Managed Care - PPO | Admitting: Family Medicine

## 2020-05-11 ENCOUNTER — Ambulatory Visit: Payer: Commercial Managed Care - PPO

## 2020-05-11 ENCOUNTER — Ambulatory Visit: Payer: Self-pay

## 2020-05-11 ENCOUNTER — Ambulatory Visit: Payer: Commercial Managed Care - PPO | Attending: Internal Medicine | Admitting: Physical Therapy

## 2020-05-11 ENCOUNTER — Encounter: Payer: Self-pay | Admitting: Family Medicine

## 2020-05-11 ENCOUNTER — Other Ambulatory Visit: Payer: Self-pay

## 2020-05-11 DIAGNOSIS — E089 Diabetes mellitus due to underlying condition without complications: Secondary | ICD-10-CM

## 2020-05-11 DIAGNOSIS — S8265XA Nondisplaced fracture of lateral malleolus of left fibula, initial encounter for closed fracture: Secondary | ICD-10-CM

## 2020-05-11 DIAGNOSIS — M25572 Pain in left ankle and joints of left foot: Secondary | ICD-10-CM

## 2020-05-11 DIAGNOSIS — R2681 Unsteadiness on feet: Secondary | ICD-10-CM | POA: Insufficient documentation

## 2020-05-11 DIAGNOSIS — R2689 Other abnormalities of gait and mobility: Secondary | ICD-10-CM | POA: Insufficient documentation

## 2020-05-11 DIAGNOSIS — R296 Repeated falls: Secondary | ICD-10-CM | POA: Diagnosis present

## 2020-05-11 DIAGNOSIS — M25672 Stiffness of left ankle, not elsewhere classified: Secondary | ICD-10-CM | POA: Diagnosis present

## 2020-05-11 DIAGNOSIS — I69354 Hemiplegia and hemiparesis following cerebral infarction affecting left non-dominant side: Secondary | ICD-10-CM | POA: Insufficient documentation

## 2020-05-11 MED ORDER — VITAMIN D-3 125 MCG (5000 UT) PO TABS
1.0000 | ORAL_TABLET | Freq: Every day | ORAL | 3 refills | Status: DC
Start: 2020-05-11 — End: 2020-11-30

## 2020-05-11 MED ORDER — VITAMIN K2 100 MCG PO TABS: 100.0000 ug | ORAL_TABLET | Freq: Every day | ORAL | 3 refills | Status: DC

## 2020-05-11 NOTE — Patient Instructions (Signed)
   Vitamin D3:  5,000 IU daily  Vitamin K2:  100 mcg daily    

## 2020-05-11 NOTE — Therapy (Signed)
[facility] [address] [place], [place], [address] Phone: [phone]   Fax:  [phone]  Physical Therapy Evaluation  Patient Details  Name: Ebony Matthews MRN: [id number] Date of Birth: 03/04/1981 Referring Provider (PT): Georgiann CockerStrup, Kathryn   Encounter Date: 05/11/2020   PT End of Session - 05/11/20 1228    Visit Number 1    Number of Visits 8    Date for PT Re-Evaluation 06/08/20    PT Start Time 1006    PT Stop Time 1050    PT Time Calculation (min) 44 min    Equipment Utilized During Treatment Gait belt    Activity Tolerance Patient tolerated treatment well    Behavior [facility] Therapy [facility] for tasks assessed/performed;Flat affect           Past Medical History:  Diagnosis Date   Diabetes mellitus without complication (HCC)    Hypertension    TIA (transient ischemic attack) 01/22/2019    Past Surgical History:  Procedure Laterality Date   NO PAST SURGERIES      There were no vitals filed for this visit.    Subjective Assessment - 05/11/20 1010    Subjective Pt's family reports pt had a stroke affecting her L side and then broke her L ankle. Pt notes that doctor did not state how long she should be wearing her cam boot but only that she's not allowed to put much weight. Pt reports that she has been sliding on her R foot to transfer. Pt's family states that they were starting to work on increasing her standing tolerance. Pt currently able to stand for 41 seconds.    Patient is accompained by: Family member    Pertinent History hx of multiple CVA, DM, HTN, drug abuse    Limitations Standing;Walking;House hold activities;Lifting    How long can you sit comfortably? no issues    How long can you stand comfortably? <30 sec    How long can you walk comfortably? <10 feet with quad cane    Patient Stated Goals Get my balance better.    Currently in Pain? No/denies    Pain Score 0-No pain    Pain Location Knee                [facility] Assessment - 05/11/20 0001      Assessment   Medical Diagnosis Ankle pain    Referring Provider (PT) Georgiann CockerStrup, Kathryn      Precautions   Precautions Fall      Restrictions   Weight Bearing Restrictions Yes    Other Position/Activity Restrictions ortho note "minimize weight bearing"      Balance Screen   Has the patient fallen in the past 6 months Yes    Has the patient had a decrease in activity level because of a fear of falling?  Yes   Pt now with L fibular fracture and CVA     Home Environment   Living Environment Private residence    Living Arrangements Parent    Available Help at Discharge Family   Celine Ahrunt has been mostly assisting pt   Type of Home House    Home Access Stairs to enter    Entrance Stairs-Number of Steps 2    Entrance Stairs-Rails Right    Home Layout One level    Home Equipment Cane - quad;Wheelchair - manual;Bedside commode      Prior Function   Level of Independence Independent    Vocation Full time employment  Cognition   Overall Cognitive Status Impaired/Different from baseline    Area of Impairment Safety/judgement;Awareness    Safety/Judgement Decreased awareness of safety;Decreased awareness of deficits    Awareness Impaired      Observation/Other Assessments   Focus on Therapeutic Outcomes (FOTO)  Unable to perform      Sensation   Light Touch Appears Intact   Grossly intact; would benefit from more detailed assessment     Posture/Postural Control   Posture Comments Prefers R side lean      AROM   Overall AROM Comments L side limited due to strength (see below)      PROM   Overall PROM Comments L UE and LE WFL      Strength   Overall Strength Comments 5/5 on R UE and LE; LUE grossly 0/5    Strength Assessment Site Hip;Knee;Ankle    Left Hip Flexion 3-/5    Left Hip Extension 3-/5    Left Hip External Rotation 2+/5    Left Hip Internal Rotation 2+/5    Left Hip ABduction 3-/5    Left Hip ADduction 3/5     Left Knee Flexion 0/5    Left Knee Extension 0/5    Left Ankle Dorsiflexion 0/5    Left Ankle Plantar Flexion 0/5      Transfers   Transfers Sit to Stand    Sit to Stand 4: Min assist;3: Mod assist    Sit to Stand Details Tactile cues for sequencing;Tactile cues for weight shifting;Tactile cues for posture;Tactile cues for weight beaing;Verbal cues for sequencing;Verbal cues for technique;Verbal cues for safe use of DME/AE;Manual facilitation for weight shifting;Manual facilitation for weight bearing    Sit to Stand Details (indicate cue type and reason) "move your left foot"                      Objective measurements completed on examination: See above findings.               PT Education - 05/11/20 1225    Education Details Educated pt and aunt about how pt would likely benefit more from HHPT and transition to neuro rehab since her fall was due to her CVA and that pt is limited on L LE weight bearing due to her fracture -- due to weight bearing status she is limited in her ortho treatment. Demonstrated and educated aunt on how to properly place pt in her cam boot.    Person(s) Educated Patient;Other (comment)   aunt   Methods Explanation;Demonstration;Tactile cues;Verbal cues;Handout    Comprehension Verbalized understanding;Returned demonstration;Verbal cues required;Need further instruction            PT Short Term Goals - 05/11/20 1238      PT SHORT TERM GOAL #1   Title Pt and caregiver will be independent with performing HEP    Baseline Newly issued    Time 4    Period Weeks    Status New    Target Date 06/08/20      PT SHORT TERM GOAL #2   Title Pt will be be able to demonstrate safe t/fs with SBA using a/d as needed    Baseline Requires min A and multiple verbal and tactile cues    Time 4    Period Weeks    Status New    Target Date 06/08/20      PT SHORT TERM GOAL #3   Title Pt will be able to tolerate standing for  at least 2 minutes  for daily tasks    Baseline Reports being able to stand for 41 sec    Time 4    Period Weeks    Status New    Target Date 06/08/20      PT SHORT TERM GOAL #4   Title Transition to [facility] or neuro services    Time 4    Period Weeks    Status New    Target Date 06/08/20             PT Long Term Goals - 04/27/20 1343      PT LONG TERM GOAL #1   Title Patient will be able to perform Timed up and go test under <40 sec with quad cane to improve functional mobility    Baseline TBD    Time 8    Period Weeks    Status New    Target Date 06/22/20      PT LONG TERM GOAL #2   Title Patient will be able to perform 5x sit to stand in under 40 sec to improve functional strength with use of one UE    Baseline 1 min 15 sec with one UE    Time 8    Period Weeks    Status New    Target Date 06/22/20      PT LONG TERM GOAL #3   Title Pt will be able to ambulate 500' with quad cane to improve community ambulation to improve independence    Baseline <10 feet per pt report with quad cane    Time 8    Period Weeks    Target Date 06/22/20      PT LONG TERM GOAL #4   Title Patient will report no new falls in one consecutive month to reduce fall risk    Baseline 6-7 falls in last 2 weeks from eval    Time 8    Period Weeks    Status New    Target Date 06/22/20      PT LONG TERM GOAL #5   Title Pt will demonstrate 9 points of improvement on Berg balance scale to improve static balance    Baseline 13/56 (eval)    Time 8    Period Weeks    Status New    Target Date 06/22/20                  Plan - 05/11/20 1229    Clinical Impression Statement Ebony Matthews is a 39 y/o F who presents to OPPT s/p L fibular fracture on 8/3 with history of recent CVA affecting her left side. Per ortho, pt is to minimize left side weight bearing. Due to limited weight bearing status and pt's recent CVA likely causing fall, pt would likely most benefit from HHPT and OT and transition to neuro rehab  for pt and family safety. Pt with very limited L side strength, limited cognitive status leading to increased fall risk and decreased ROM. Family and pt educated on performing AAROM and gentle stretching at home for her left LE. Pt would benefit from PT to address her neuromuscular deficits to optimize her level of function. Currently recommending for pt to continue PT here in ortho clinic until she is able to obtain [facility] services or if she is unable to obtain [facility] transition her to neuro with new MD order.    Personal Factors and Comorbidities Comorbidity 3+;Past/Current Experience;Time since onset of injury/illness/exacerbation;Transportation;Behavior Pattern  Comorbidities DM, HTN, hx of multiple strokes in last 6 months    Examination-Activity Limitations Bathing;Bed Mobility;Caring for Others;Carry;Dressing;Hygiene/Grooming;Lift;Reach Overhead;Squat    Examination-Participation Restrictions Cleaning;Community Activity;Driving;Laundry;Yard Work;Occupation;Church;Meal Prep    Stability/Clinical Decision Making Evolving/Moderate complexity    Clinical Decision Making Moderate    Rehab Potential Good    PT Frequency 2x / week    PT Duration 4 weeks    PT Treatment/Interventions ADLs/Self Care Home Management;Gait training;Stair training;Functional mobility training;Therapeutic activities;Therapeutic exercise;Balance training;Neuromuscular re-education;Manual techniques;Orthotic Fit/Training;Patient/family education;Cognitive remediation;Passive range of motion;Energy conservation    PT Next Visit Plan Follow up on [facility] referral or transition to neuro. Continue AROM/AAROM exercises as able. Work on transfers and standing as able (with caregiver education).    PT Home Exercise Plan Access Code [facility]    Recommended Other Services Home health PT and OT. If unable to obtain, pt would most benefit from return to neuro rehab.    Consulted and Agree with Plan of Care Patient;Family member/caregiver    Family  Member Consulted Aunt           Patient will benefit from skilled therapeutic intervention in order to improve the following deficits and impairments:  Abnormal gait, Decreased activity tolerance, Decreased balance, Decreased cognition, Decreased coordination, Decreased endurance, Decreased knowledge of precautions, Decreased mobility, Decreased range of motion, Decreased safety awareness, Decreased strength, Difficulty walking, Impaired perceived functional ability, Impaired flexibility, Impaired sensation, Impaired tone, Impaired UE functional use, Postural dysfunction  Visit Diagnosis: Hemiplegia and hemiparesis following cerebral infarction affecting left non-dominant side (HCC)  Other abnormalities of gait and mobility  Unsteadiness on feet  Repeated falls  Stiffness of left ankle, not elsewhere classified     Problem List Patient Active Problem List   Diagnosis Date Noted   Uncontrolled type 2 diabetes mellitus with hyperglycemia, without long-term current use of insulin (HCC) 04/16/2020   CVA (cerebral vascular accident) (HCC) 04/15/2020   Cocaine use 04/15/2020   Acute CVA (cerebrovascular accident) (HCC) 03/04/2020   Stroke (cerebrum) (HCC) 03/03/2020   Weakness    Noncompliance with medication regimen    TIA (transient ischemic attack) 01/22/2019   Diabetes mellitus due to underlying condition without complications (HCC) 05/30/2014   Essential hypertension, benign 05/30/2014   Smoking 05/30/2014    Ebony Matthews PT, DPT 05/11/2020, 12:42 PM  [facility] [address] [place], [place], [address] Phone: [phone]   Fax:  [phone]  Name: Ebony Matthews MRN: [id number] Date of Birth: Jul 12, 1981

## 2020-05-11 NOTE — Progress Notes (Signed)
Office Visit Note   Patient: Ebony Matthews           Date of Birth: 03-25-1981           MRN: [id number] Visit Date: 05/11/2020 Requested by: Georgiann Cocker, NP [address] [place],  [place] [address] PCP: Georgiann Cocker, NP  Subjective: Chief Complaint  Patient presents with   Left Ankle - Pain    HPI: She is here with left ankle fracture.  About 10 days ago she bumped her leg against a piece of furniture and felt immediate pain on the lateral ankle.  No previous problems with her ankle.  She went to the hospital where x-rays revealed a transverse lateral malleolus fracture, nondisplaced.  She was placed in a fracture boot and has been mostly nonweightbearing.  She now presents for follow-up.  Pain is minimal.  She is a smoker and is trying to quit.  She has diabetes which is poorly controlled.  She does not know how to eat a proper diabetic diet.               ROS:   All other systems were reviewed and are negative.  Objective: Vital Signs: There were no vitals taken for this visit.  Physical Exam:  General:  Alert and oriented, in no acute distress. Pulm:  Breathing unlabored. Psy:  Normal mood, congruent affect. Skin: There is some swelling in the left ankle and foot but no bruising, no skin breakdown. Left ankle: Mild tenderness at the distal fibula.  Neurovascularly intact.  Imaging: XR Ankle Complete Left  Result Date: 05/11/2020 X-rays left ankle show a transverse distal fibula fracture with very slight lateral displacement, but overall good alignment.  The mortise is still intact.   Assessment & Plan: 1.  10 days status post left ankle lateral malleolus fracture with very slight displacement, still acceptably aligned. -We discussed the importance of smoking cessation and diabetes control.  I referred her for nutrition management. -Start vitamin D3 and vitamin K2. -Minimize weightbearing, return in about 10 days for repeat x-rays and exam. -Out  of work for 2 months while recovering.  She works at a [employer].     Procedures: No procedures performed  No notes on file     PMFS History: Patient Active Problem List   Diagnosis Date Noted   Uncontrolled type 2 diabetes mellitus with hyperglycemia, without long-term current use of insulin (HCC) 04/16/2020   CVA (cerebral vascular accident) (HCC) 04/15/2020   Cocaine use 04/15/2020   Acute CVA (cerebrovascular accident) (HCC) 03/04/2020   Stroke (cerebrum) (HCC) 03/03/2020   Weakness    Noncompliance with medication regimen    TIA (transient ischemic attack) 01/22/2019   Diabetes mellitus due to underlying condition without complications (HCC) 05/30/2014   Essential hypertension, benign 05/30/2014   Smoking 05/30/2014   Past Medical History:  Diagnosis Date   Diabetes mellitus without complication (HCC)    Hypertension    TIA (transient ischemic attack) 01/22/2019    Family History  Problem Relation Age of Onset   Diabetes Other    Hypertension Maternal Grandmother     Past Surgical History:  Procedure Laterality Date   NO PAST SURGERIES     Social History   Occupational History   Not on file  Tobacco Use   Smoking status: Current Every Day Smoker    Packs/day: 1.00    Years: 15.00    Pack years: 15.00   Smokeless tobacco: Never Used  Vaping Use   Vaping Use: Never used  Substance and Sexual Activity   Alcohol use: No   Drug use: Yes    Types: Marijuana    Comment: occasional   Sexual activity: Yes    Birth control/protection: None

## 2020-05-14 ENCOUNTER — Ambulatory Visit: Payer: Commercial Managed Care - PPO | Admitting: Physical Therapy

## 2020-05-15 ENCOUNTER — Other Ambulatory Visit: Payer: Self-pay

## 2020-05-15 ENCOUNTER — Encounter: Payer: Self-pay | Admitting: Physical Therapy

## 2020-05-15 ENCOUNTER — Ambulatory Visit: Payer: Commercial Managed Care - PPO | Admitting: Physical Therapy

## 2020-05-15 DIAGNOSIS — I69354 Hemiplegia and hemiparesis following cerebral infarction affecting left non-dominant side: Secondary | ICD-10-CM | POA: Diagnosis not present

## 2020-05-15 DIAGNOSIS — M25672 Stiffness of left ankle, not elsewhere classified: Secondary | ICD-10-CM

## 2020-05-15 DIAGNOSIS — R2689 Other abnormalities of gait and mobility: Secondary | ICD-10-CM

## 2020-05-15 DIAGNOSIS — R296 Repeated falls: Secondary | ICD-10-CM

## 2020-05-15 DIAGNOSIS — R2681 Unsteadiness on feet: Secondary | ICD-10-CM

## 2020-05-15 NOTE — Patient Instructions (Signed)
Access Code: [email or web address]: [email or web address]: 08/17/2021Prepared by: Victorino Dike PaaExercises  Beginner Bridge - 3 x daily - 7 x weekly - 2 sets - 10 reps - 5 hold Patient Education  Doctor, hospital with Wheelchair  Assisted Stand Pivot Bed Transfer To and From Wheelchair

## 2020-05-15 NOTE — Therapy (Signed)
[facility] [address] [place], [place], [address] Phone: [phone]   Fax:  [phone]  Physical Therapy Treatment  Patient Details  Name: Ebony Matthews MRN: [id number] Date of Birth: [birth year] Referring Provider (PT): Georgiann Cocker   Encounter Date: 05/15/2020   PT End of Session - 05/15/20 1104    Visit Number 2    Number of Visits 8    Date for PT Re-Evaluation 06/08/20    Authorization Type [employer]    PT Start Time 1000    PT Stop Time 1045    PT Time Calculation (min) 45 min    Equipment Utilized During Treatment Gait belt    Activity Tolerance Patient tolerated treatment well    Behavior During Therapy [facility] for tasks assessed/performed;Flat affect           Past Medical History:  Diagnosis Date   Diabetes mellitus without complication (HCC)    Hypertension    TIA (transient ischemic attack) 01/22/2019    Past Surgical History:  Procedure Laterality Date   NO PAST SURGERIES      There were no vitals filed for this visit.   Subjective Assessment - 05/15/20 1006    Subjective No pain today in L ankle. She is  trying to get scheduled at Neuro. I have been working on standing at home.    Currently in Pain? No/denies                [facility] Adult PT Treatment/Exercise - 05/15/20 0001      Transfers   Transfers Sit to Stand    Sit to Stand 5: Supervision;4: Min guard    Sit to Stand Details Tactile cues for initiation;Tactile cues for weight shifting;Tactile cues for placement;Verbal cues for technique;Verbal cues for precautions/safety    Squat Pivot Transfers 5: Supervision;4: Min guard    Squat Pivot Transfer Details (indicate cue type and reason) supervision to Rt, min A to LE with cues     Transfer Cueing lean forward to unwgt hips     Comments done x 5 with decreasing amts of physical A, decreased WB on LLE and LUE, unable to do with Rt UE on L thigh , worked in standing tolerance in parallel  bars , about 1 min each rep       Ambulation/Gait   Pre-Gait Activities weightshift to LLE in parallel bars, stepping with LLE x 10       Self-Care   Self-Care Other Self-Care Comments    Other Self-Care Comments  transfers bed to wheelchair to weaker side      Neuro Re-ed    Neuro Re-ed Details  supine manual resisted hip flex/ext x 10, LLE External and internal rotation , bridging 2 x 10                  PT Education - 05/15/20 1103    Education Details transfers HEP, safety    Person(s) Educated Patient;Caregiver(s)    Methods Explanation;Demonstration;Tactile cues;Verbal cues;Handout    Comprehension Verbalized understanding;Returned demonstration;Need further instruction;Verbal cues required            PT Short Term Goals - 05/15/20 1104      PT SHORT TERM GOAL #1   Title Pt and caregiver will be independent with performing HEP    Status On-going      PT SHORT TERM GOAL #2   Title Pt will be be able to demonstrate safe t/fs with SBA using a/d as needed  Baseline Requires min A and multiple verbal and tactile cues, practiced today    Status On-going      PT SHORT TERM GOAL #3   Title Pt will be able to tolerate standing for at least 2 minutes for daily tasks    Baseline about 1 in today with supervision and needs Rt UE    Status On-going      PT SHORT TERM GOAL #4   Title Transition to [facility] or neuro services    Status On-going             PT Long Term Goals - 04/27/20 1343      PT LONG TERM GOAL #1   Title Patient will be able to perform Timed up and go test under <40 sec with quad cane to improve functional mobility    Baseline TBD    Time 8    Period Weeks    Status New    Target Date 06/22/20      PT LONG TERM GOAL #2   Title Patient will be able to perform 5x sit to stand in under 40 sec to improve functional strength with use of one UE    Baseline 1 min 15 sec with one UE    Time 8    Period Weeks    Status New    Target Date 06/22/20       PT LONG TERM GOAL #3   Title Pt will be able to ambulate 500' with quad cane to improve community ambulation to improve independence    Baseline <10 feet per pt report with quad cane    Time 8    Period Weeks    Target Date 06/22/20      PT LONG TERM GOAL #4   Title Patient will report no new falls in one consecutive month to reduce fall risk    Baseline 6-7 falls in last 2 weeks from eval    Time 8    Period Weeks    Status New    Target Date 06/22/20      PT LONG TERM GOAL #5   Title Pt will demonstrate 9 points of improvement on Berg balance scale to improve static balance    Baseline 13/56 (eval)    Time 8    Period Weeks    Status New    Target Date 06/22/20                 Plan - 05/15/20 1006    Clinical Impression Statement Worked on transfers today as a priority.  Patient's caregiver anxious and feels her ankle fracture is "her fault".  She was encouarged that Clytee was able to transfer to Rt side with supervision only.  She will call this afternoon to get transferred to neuro.    PT Treatment/Interventions ADLs/Self Care Home Management;Gait training;Stair training;Functional mobility training;Therapeutic activities;Therapeutic exercise;Balance training;Neuromuscular re-education;Manual techniques;Orthotic Fit/Training;Patient/family education;Cognitive remediation;Passive range of motion;Energy conservation    PT Next Visit Plan Follow up on [facility] referral or transition to neuro. Continue AROM/AAROM exercises as able. Work on transfers and standing as able (with caregiver education).    PT Home Exercise Plan seated hip march, bridge, transfer training (bed, commode), PROM L ankle?    Consulted and Agree with Plan of Care Patient;Family member/caregiver    Family Member Consulted Aunt           Patient will benefit from skilled therapeutic intervention in order to improve the following deficits and impairments:  Abnormal gait, Decreased activity tolerance,  Decreased balance, Decreased cognition, Decreased coordination, Decreased endurance, Decreased knowledge of precautions, Decreased mobility, Decreased range of motion, Decreased safety awareness, Decreased strength, Difficulty walking, Impaired perceived functional ability, Impaired flexibility, Impaired sensation, Impaired tone, Impaired UE functional use, Postural dysfunction  Visit Diagnosis: Hemiplegia and hemiparesis following cerebral infarction affecting left non-dominant side (HCC)  Other abnormalities of gait and mobility  Unsteadiness on feet  Stiffness of left ankle, not elsewhere classified  Repeated falls     Problem List Patient Active Problem List   Diagnosis Date Noted   Uncontrolled type 2 diabetes mellitus with hyperglycemia, without long-term current use of insulin (HCC) 04/16/2020   CVA (cerebral vascular accident) (HCC) 04/15/2020   Cocaine use 04/15/2020   Acute CVA (cerebrovascular accident) (HCC) 03/04/2020   Stroke (cerebrum) (HCC) 03/03/2020   Weakness    Noncompliance with medication regimen    TIA (transient ischemic attack) 01/22/2019   Diabetes mellitus due to underlying condition without complications (HCC) 05/30/2014   Essential hypertension, benign 05/30/2014   Smoking 05/30/2014    Telissa Palmisano 05/15/2020, 11:08 AM  [facility] [address] [place], [place], [address] Phone: [phone]   Fax:  [phone]  Name: Oliana Gowens MRN: [id number] Date of Birth: June 07, 1981  Karie Mainland, PT 05/15/20 11:08 AM Phone: [phone] Fax: [phone]

## 2020-05-16 ENCOUNTER — Ambulatory Visit: Payer: Commercial Managed Care - PPO

## 2020-05-18 ENCOUNTER — Ambulatory Visit: Payer: Commercial Managed Care - PPO

## 2020-05-21 ENCOUNTER — Ambulatory Visit: Payer: Commercial Managed Care - PPO

## 2020-05-21 ENCOUNTER — Other Ambulatory Visit: Payer: Self-pay

## 2020-05-21 DIAGNOSIS — R2681 Unsteadiness on feet: Secondary | ICD-10-CM

## 2020-05-21 DIAGNOSIS — M25672 Stiffness of left ankle, not elsewhere classified: Secondary | ICD-10-CM

## 2020-05-21 DIAGNOSIS — R2689 Other abnormalities of gait and mobility: Secondary | ICD-10-CM

## 2020-05-21 DIAGNOSIS — I69354 Hemiplegia and hemiparesis following cerebral infarction affecting left non-dominant side: Secondary | ICD-10-CM | POA: Diagnosis not present

## 2020-05-21 DIAGNOSIS — R296 Repeated falls: Secondary | ICD-10-CM

## 2020-05-21 NOTE — Patient Instructions (Signed)
Access Code: [email or web address] URL: [email or web address] Date: 05/21/2020 Prepared by: Lavone Nian  Exercises Supine Heel Slide - 2 x daily - 7 x weekly - 1 sets - 10 reps Bent Knee Fallouts - 2 x daily - 7 x weekly - 1 sets - 10 reps Small Range Straight Leg Raise - 2 x daily - 7 x weekly - 1 sets - 10 reps Clamshell - 2 x daily - 7 x weekly - 1 sets - 10 reps

## 2020-05-21 NOTE — Therapy (Signed)
[facility] [address] [place], [place], [address] Phone: [phone]   Fax:  [phone]  Physical Therapy Treatment  Patient Details  Name: Ebony Matthews MRN: [id number] Date of Birth: May 31, 1981 Referring Provider (PT): Georgiann Cocker   Encounter Date: 05/21/2020   PT End of Session - 05/21/20 0845    Visit Number 3    Number of Visits 8    Date for PT Re-Evaluation 06/08/20    Authorization Type UHC    PT Start Time 0800    PT Stop Time 0845    PT Time Calculation (min) 45 min    Equipment Utilized During Treatment Gait belt    Activity Tolerance Patient tolerated treatment well    Behavior During Therapy [name] for tasks assessed/performed;Flat affect           Past Medical History:  Diagnosis Date   Diabetes mellitus without complication (HCC)    Hypertension    TIA (transient ischemic attack) 01/22/2019    Past Surgical History:  Procedure Laterality Date   NO PAST SURGERIES      There were no vitals filed for this visit.   Subjective Assessment - 05/21/20 0827    Subjective No falls since recent ED visit. Pt uses bed side commode. Has not been walking around a lot at home.    Patient is accompained by: Family member    Pertinent History hx of multiple CVA, DM, HTN, drug abuse    Limitations Standing;Walking;House hold activities;Lifting    How long can you sit comfortably? no issues    How long can you stand comfortably? <30 sec    How long can you walk comfortably? <10 feet with quad cane    Patient Stated Goals Get my balance better.    Currently in Pain? No/denies               Gait training: 1 x 30' min to mod A with hemi walker, 4 bouts of LOB posteriorly, cues to sequencing with walker and stepping  Stair training: 1 x 4 steps with rail on R going up, rail on R coming down, mod A to bring left leg up the step, to reduce scissoring with L LE 2 x LOB  Supine heel slides:  20x, AA on L SLR: 2 x 10 Supine hooklying clamshells: 20x SL clamshells: 2 x 10 R and L Bridge: 2 x 10  Access Code: [email or web address] URL: [email or web address] Date: 05/21/2020 Prepared by: Lavone Nian  Exercises Supine Heel Slide - 2 x daily - 7 x weekly - 1 sets - 10 reps Bent Knee Fallouts - 2 x daily - 7 x weekly - 1 sets - 10 reps Small Range Straight Leg Raise - 2 x daily - 7 x weekly - 1 sets - 10 reps Clamshell - 2 x daily - 7 x weekly - 1 sets - 10 reps                        PT Education - 05/21/20 [phone]    Education Details Pt educated on having a bell or chime by her bed side so she can call her mom when she needs assistance.            PT Short Term Goals - 05/15/20 1104      PT SHORT TERM GOAL #1   Title Pt and caregiver will be independent with performing HEP    Status On-going  PT SHORT TERM GOAL #2   Title Pt will be be able to demonstrate safe t/fs with SBA using a/d as needed    Baseline Requires min A and multiple verbal and tactile cues, practiced today    Status On-going      PT SHORT TERM GOAL #3   Title Pt will be able to tolerate standing for at least 2 minutes for daily tasks    Baseline about 1 in today with supervision and needs Rt UE    Status On-going      PT SHORT TERM GOAL #4   Title Transition to [facility] or neuro services    Status On-going             PT Long Term Goals - 04/27/20 1343      PT LONG TERM GOAL #1   Title Patient will be able to perform Timed up and go test under <40 sec with quad cane to improve functional mobility    Baseline TBD    Time 8    Period Weeks    Status New    Target Date 06/22/20      PT LONG TERM GOAL #2   Title Patient will be able to perform 5x sit to stand in under 40 sec to improve functional strength with use of one UE    Baseline 1 min 15 sec with one UE    Time 8    Period Weeks    Status New    Target Date 06/22/20      PT LONG TERM GOAL #3   Title Pt  will be able to ambulate 500' with quad cane to improve community ambulation to improve independence    Baseline <10 feet per pt report with quad cane    Time 8    Period Weeks    Target Date 06/22/20      PT LONG TERM GOAL #4   Title Patient will report no new falls in one consecutive month to reduce fall risk    Baseline 6-7 falls in last 2 weeks from eval    Time 8    Period Weeks    Status New    Target Date 06/22/20      PT LONG TERM GOAL #5   Title Pt will demonstrate 9 points of improvement on Berg balance scale to improve static balance    Baseline 13/56 (eval)    Time 8    Period Weeks    Status New    Target Date 06/22/20                  Patient will benefit from skilled therapeutic intervention in order to improve the following deficits and impairments:     Visit Diagnosis: Hemiplegia and hemiparesis following cerebral infarction affecting left non-dominant side (HCC)  Other abnormalities of gait and mobility  Unsteadiness on feet  Stiffness of left ankle, not elsewhere classified  Repeated falls     Problem List Patient Active Problem List   Diagnosis Date Noted   Uncontrolled type 2 diabetes mellitus with hyperglycemia, without long-term current use of insulin (HCC) 04/16/2020   CVA (cerebral vascular accident) (HCC) 04/15/2020   Cocaine use 04/15/2020   Acute CVA (cerebrovascular accident) (HCC) 03/04/2020   Stroke (cerebrum) (HCC) 03/03/2020   Weakness    Noncompliance with medication regimen    TIA (transient ischemic attack) 01/22/2019   Diabetes mellitus due to underlying condition without complications (HCC) 05/30/2014   Essential hypertension,  benign 05/30/2014   Smoking 05/30/2014    Ileana Ladd 05/21/2020, 8:50 AM  [facility] [address] [place], [place], [address] Phone: [phone]   Fax:  [phone]  Name: Ebony Matthews MRN:  [id number] Date of Birth: 1981/04/24

## 2020-05-23 ENCOUNTER — Other Ambulatory Visit: Payer: Self-pay

## 2020-05-23 ENCOUNTER — Ambulatory Visit: Payer: Commercial Managed Care - PPO

## 2020-05-23 DIAGNOSIS — M25672 Stiffness of left ankle, not elsewhere classified: Secondary | ICD-10-CM

## 2020-05-23 DIAGNOSIS — R2689 Other abnormalities of gait and mobility: Secondary | ICD-10-CM

## 2020-05-23 DIAGNOSIS — I69354 Hemiplegia and hemiparesis following cerebral infarction affecting left non-dominant side: Secondary | ICD-10-CM | POA: Diagnosis not present

## 2020-05-23 DIAGNOSIS — R2681 Unsteadiness on feet: Secondary | ICD-10-CM

## 2020-05-23 DIAGNOSIS — R296 Repeated falls: Secondary | ICD-10-CM

## 2020-05-23 NOTE — Therapy (Signed)
[facility] [address] [place], [place], [address] Phone: [phone]   Fax:  [phone]  Physical Therapy Treatment/Re-Cert  Patient Details  Name: Ebony Matthews MRN: [id number] Date of Birth: 1981-04-20 Referring Provider (PT): Georgiann CockerKathryn Strup, MD. Followed by Shon MilletAdam Jaffe, MD   Encounter Date: 05/23/2020   PT End of Session - 05/23/20 1318    Visit Number 4    Number of Visits 16    Date for PT Re-Evaluation 08/21/20   POC for 8 weeks, Cert for 90 days   Authorization Type UHC    PT Start Time 1315    PT Stop Time 1357    PT Time Calculation (min) 42 min    Equipment Utilized During Treatment Gait belt    Activity Tolerance Patient tolerated treatment well    Behavior During Therapy [facility] for tasks assessed/performed;Flat affect           Past Medical History:  Diagnosis Date  . Diabetes mellitus without complication (HCC)   . Hypertension   . TIA (transient ischemic attack) 01/22/2019    Past Surgical History:  Procedure Laterality Date  . NO PAST SURGERIES      There were no vitals filed for this visit.   Subjective Assessment - 05/23/20 1317    Subjective Patient reports doing well since last visit. No falls to report. No pain.    Patient is accompained by: Family member    Pertinent History hx of multiple CVA, DM, HTN, drug abuse    Limitations Standing;Walking;House hold activities;Lifting    How long can you sit comfortably? no issues    How long can you stand comfortably? <30 sec    How long can you walk comfortably? <10 feet with quad cane    Patient Stated Goals Get my balance better.    Currently in Pain? No/denies                [facility] PT Assessment - 05/23/20 0001      Assessment   Medical Diagnosis CVA w/ Ankle Fracture    Referring Provider (PT) Referred by: Georgiann CockerKathryn Strup, MD. Followed by Shon MilletAdam Jaffe, MD               [facility] Treatment/Exercise - 05/23/20 0001        Transfers   Transfers Sit to Stand;Stand to Sit;Stand Pivot Transfers    Sit to Stand 4: Min assist    Sit to Stand Details Tactile cues for initiation;Tactile cues for weight shifting;Tactile cues for placement;Visual cues/gestures for sequencing;Verbal cues for safe use of DME/AE    Sit to Stand Details (indicate cue type and reason) completed from w/c, verbal cues to move L foot. initially patient attempt to stand with LLE positioned posterior    Stand to Sit 4: Min assist    Stand to Sit Details (indicate cue type and reason) Verbal cues for technique;Verbal cues for precautions/safety    Stand to Sit Details verbal cues for controlled descent.     Squat Pivot Transfers 4: Min assist    Squat Pivot Transfer Details (indicate cue type and reason) completed transfer from w/c to mat, PT providing Min A for LLE. completed x 2 reps.     Comments completed sit <> stands x 10 reps with PT providing manual faciliation to promote weight shift on LLE. Mirror placed in front of patient for visual feedback. Patient demo heavy R lateral weight shift/lean with completion of sit <> stand initially,  able to correct with increased verbal/tactile cues.       Ambulation/Gait   Ambulation/Gait Yes    Ambulation/Gait Assistance 4: Min assist;4: Min guard    Ambulation/Gait Assistance Details Patient decreased awareness and proprioception with LLE, requirining Min A for placement of LLE. Patient demo recurvatum with ambulation, PT providing verbal/tactile cues to help reduce and use control but patient unable to demo any control in LLE during today's session. Initiated gait training with RW with L hand hand grip. Patient able to demo ability to use R hand to place L hand in RW and strap in. Verbal cues to unstrap hand prior to descent upon completion of ambulation. Patient is currently WBAT as tolerated with C.AM boot     Ambulation Distance (Feet) 14 Feet    Assistive device Rolling walker   w L Hand Grip  Attachment   Gait Pattern Step-to pattern    Ambulation Surface Level;Indoor    Pre-Gait Activities --      Self-Care   Self-Care Other Self-Care Comments    Other Self-Care Comments  PT assessed LLE ankle for swelling. Mild swelling noted around biltaeral malleous. PT educating on elevating above heart throughout the day, as well as applying cold pakc to area for short times (<10 minutes). Educated aunt/patient to use towels between cold pack due to decreased sensation on L side as impairment of stroke, and to monitor closely.       Neuro Re-ed    Neuro Re-ed Details  Completed standing activity including weight shifting to LLE x 4 minutes. Patient able to stand without UE support from walker, PT providing verbal/tactile cues for improved weight shift to LLE.       Exercises   Exercises Other Exercises    Other Exercises  Completed heel slides seated with towel under LLE, working on improved motion. Patient require min A at times for completion. tactile/verbal cues required throughout. Completed 3 x 5 reps.                     PT Short Term Goals - 05/15/20 1104      PT SHORT TERM GOAL #1   Title Pt and caregiver will be independent with performing HEP    Status On-going      PT SHORT TERM GOAL #2   Title Pt will be be able to demonstrate safe t/fs with SBA using a/d as needed    Baseline Requires min A and multiple verbal and tactile cues, practiced today    Status On-going      PT SHORT TERM GOAL #3   Title Pt will be able to tolerate standing for at least 2 minutes for daily tasks    Baseline about 1 in today with supervision and needs Rt UE    Status On-going      PT SHORT TERM GOAL #4   Title Transition to [facility] or neuro services    Status On-going             PT Long Term Goals - 04/27/20 1343      PT LONG TERM GOAL #1   Title Patient will be able to perform Timed up and go test under <40 sec with quad cane to improve functional mobility    Baseline TBD      Time 8    Period Weeks    Status New    Target Date 06/22/20      PT LONG TERM GOAL #2  Title Patient will be able to perform 5x sit to stand in under 40 sec to improve functional strength with use of one UE    Baseline 1 min 15 sec with one UE    Time 8    Period Weeks    Status New    Target Date 06/22/20      PT LONG TERM GOAL #3   Title Pt will be able to ambulate 500' with quad cane to improve community ambulation to improve independence    Baseline <10 feet per pt report with quad cane    Time 8    Period Weeks    Target Date 06/22/20      PT LONG TERM GOAL #4   Title Patient will report no new falls in one consecutive month to reduce fall risk    Baseline 6-7 falls in last 2 weeks from eval    Time 8    Period Weeks    Status New    Target Date 06/22/20      PT LONG TERM GOAL #5   Title Pt will demonstrate 9 points of improvement on Berg balance scale to improve static balance    Baseline 13/56 (eval)    Time 8    Period Weeks    Status New    Target Date 06/22/20             PT Short Term Goals - 05/23/20 1746      PT SHORT TERM GOAL #1   Title Patient will report independence with HEP with caregiver asssitance (All STG: 06/13/20)    Baseline HEP established    Time 4    Period Weeks    Status Revised    Target Date 06/13/20      PT SHORT TERM GOAL #2   Title Pt will be be able to demonstrate ability to complete transfers from w/c to mat w/ LRAD with CGA to demo improved functional mobility    Baseline Requires min A and multiple verbal and tactile cues    Time 3    Period Weeks    Status Revised      PT SHORT TERM GOAL #3   Title Patient will demo ability to stand for at least 5 minutes without UE support to demo improved tolerance for standing to allow for completion of ADL's    Baseline Supervision for 1-2 minutes, RUE Support    Time 4    Period Weeks    Status Revised      PT SHORT TERM GOAL #4   Title Patient will demo ability to  ambulate 75 ft w/ LRAD and Min A to demo improved functional mobility    Baseline 14 ft, Min A    Time 4    Period Weeks    Status Revised           PT Long Term Goals - 05/23/20 1750      PT LONG TERM GOAL #1   Title Patient will demo ability to complete transfers with LRAD and supervision for improved functional mobility (ALL LTG: 07/11/20)    Baseline Min A    Time 8    Period Weeks    Status Revised    Target Date 07/11/20      PT LONG TERM GOAL #2   Title Patient will demo ability to ambulate >150 ft with LRAD and CGA to demo improved functional mobility and ambulation within the home    Baseline 14 ft,  Min A    Time 8    Period Weeks    Status Revised      PT LONG TERM GOAL #3   Title Patient will demo ability to complete TUG <45 seconds to demo improved ambulation and mobility    Baseline no baseline established    Time 8    Period Weeks    Status Revised      PT LONG TERM GOAL #4   Title Patient and caregiver/family will be educated on fall prevention within the home to reduce fall risk    Baseline dependent    Time 8    Period Weeks    Status New                 Plan - 05/23/20 1738    Clinical Impression Statement Today's skilled PT session included focus on transfers maintaining WBAT on LLE with CAM Boo, requiring Min A with transfers. Introduced RW today with L Hand Grip Attachment with patient tolerating well. Pt demo increased knee recurvatum with ambulation, despite verbal/tactile cues limited control noted. Will continue to progress toward all goals.    Personal Factors and Comorbidities Comorbidity 3+;Past/Current Experience;Time since onset of injury/illness/exacerbation;Transportation;Behavior Pattern    Comorbidities DM, HTN, hx of multiple strokes in last 6 months    Examination-Activity Limitations Bathing;Bed Mobility;Caring for Others;Carry;Dressing;Hygiene/Grooming;Lift;Reach Overhead;Squat    Examination-Participation Restrictions  Cleaning;Community Activity;Driving;Laundry;Yard Work;Occupation;Church;Meal Prep    Rehab Potential Good    PT Frequency 2x / week    PT Duration 8 weeks    PT Treatment/Interventions ADLs/Self Care Home Management;Gait training;Stair training;Functional mobility training;Therapeutic activities;Therapeutic exercise;Balance training;Neuromuscular re-education;Manual techniques;Orthotic Fit/Training;Patient/family education;Cognitive remediation;Passive range of motion;Energy conservation;DME Instruction;Electrical Stimulation    PT Next Visit Plan Work on Transfers. Gait w/ Swedish Knee Cage for recurvatum? Did we recieve OT referral?    Consulted and Agree with Plan of Care Patient;Family member/caregiver    Family Member Consulted Aunt           Patient will benefit from skilled therapeutic intervention in order to improve the following deficits and impairments:  Abnormal gait, Decreased activity tolerance, Decreased balance, Decreased cognition, Decreased coordination, Decreased endurance, Decreased knowledge of precautions, Decreased mobility, Decreased range of motion, Decreased safety awareness, Decreased strength, Difficulty walking, Impaired perceived functional ability, Impaired flexibility, Impaired sensation, Impaired tone, Impaired UE functional use, Postural dysfunction, Pain  Visit Diagnosis: Hemiplegia and hemiparesis following cerebral infarction affecting left non-dominant side (HCC)  Other abnormalities of gait and mobility  Unsteadiness on feet  Stiffness of left ankle, not elsewhere classified  Repeated falls     Problem List Patient Active Problem List   Diagnosis Date Noted  . Uncontrolled type 2 diabetes mellitus with hyperglycemia, without long-term current use of insulin (HCC) 04/16/2020  . CVA (cerebral vascular accident) (HCC) 04/15/2020  . Cocaine use 04/15/2020  . Acute CVA (cerebrovascular accident) (HCC) 03/04/2020  . Stroke (cerebrum) (HCC)  03/03/2020  . Weakness   . Noncompliance with medication regimen   . TIA (transient ischemic attack) 01/22/2019  . Diabetes mellitus due to underlying condition without complications (HCC) 05/30/2014  . Essential hypertension, benign 05/30/2014  . Smoking 05/30/2014    Tempie Donning, PT, DPT 05/23/2020, 5:46 PM  [facility] [address] [place], [place], [address] Phone: [phone]   Fax:  [phone]  Name: Ebony Matthews MRN: [id number] Date of Birth: 21-Jan-1981

## 2020-05-24 ENCOUNTER — Other Ambulatory Visit: Payer: Self-pay

## 2020-05-24 ENCOUNTER — Telehealth: Payer: Self-pay

## 2020-05-24 DIAGNOSIS — G8102 Flaccid hemiplegia affecting left dominant side: Secondary | ICD-10-CM

## 2020-05-24 DIAGNOSIS — G8194 Hemiplegia, unspecified affecting left nondominant side: Secondary | ICD-10-CM

## 2020-05-24 NOTE — Telephone Encounter (Signed)
Dr. Everlena Cooper, Avis Epley is being treated by Physical Therapy for Left Hemiparesis and Hemiplegia and Left Ankle Fracture.  The patient would benefit from Occupational Therapy evaluation for Left Hemiparesis and Hemiplegia in LUE.   If you agree, please place an order in [facility] workque in [facility] or fax the order to [phone]. Thank you, Adelfa Koh, PT, DPT  [facility] [address] [place], [place]  [address] Phone:  [phone] Fax:  [phone]

## 2020-05-24 NOTE — Telephone Encounter (Signed)
Sheena, would you please enter an order for OT?  Thanks.

## 2020-05-24 NOTE — Telephone Encounter (Signed)
Thank you :)

## 2020-05-24 NOTE — Telephone Encounter (Signed)
Order added.

## 2020-05-25 ENCOUNTER — Encounter: Payer: Self-pay | Admitting: Family Medicine

## 2020-05-25 ENCOUNTER — Ambulatory Visit: Payer: Commercial Managed Care - PPO | Admitting: Family Medicine

## 2020-05-25 ENCOUNTER — Other Ambulatory Visit: Payer: Self-pay

## 2020-05-25 ENCOUNTER — Ambulatory Visit (INDEPENDENT_AMBULATORY_CARE_PROVIDER_SITE_OTHER): Payer: Commercial Managed Care - PPO

## 2020-05-25 VITALS — Ht 67.0 in | Wt 180.0 lb

## 2020-05-25 DIAGNOSIS — M25572 Pain in left ankle and joints of left foot: Secondary | ICD-10-CM

## 2020-05-25 NOTE — Progress Notes (Signed)
° °  Office Visit Note   Patient: Ebony Matthews           Date of Birth: 1980-10-17           MRN: [id number] Visit Date: 05/25/2020 Requested by: Georgiann Cocker, NP [address] [place],  [place] [address] PCP: Georgiann Cocker, NP  Subjective: Chief Complaint  Patient presents with   Left Ankle - Fracture, Follow-up    Doing well, denies pain. States that she tries to be nonweightbearing.    HPI: She is almost 4 weeks status post left ankle lateral malleolus fracture.  Doing well overall, pain steadily improving.  She is minimally weightbearing in her fracture boot.              ROS:   All other systems were reviewed and are negative.  Objective: Vital Signs: Ht 5\' 7"  (1.702 m)    Wt 180 lb (81.6 kg)    BMI 28.19 kg/m   Physical Exam:  General:  Alert and oriented, in no acute distress. Pulm:  Breathing unlabored. Psy:  Normal mood, congruent affect.  Left ankle: Very minimal tenderness to palpation over the lateral malleolus.  Imaging: XR Ankle Complete Left  Result Date: 05/25/2020 X-rays of the left ankle reveal stable nearly anatomic alignment of the lateral malleolus fracture, I believe she has some callus formation present.   Assessment & Plan: 1.  Clinically healing 1 month status post left ankle lateral malleolus fracture. -Okay to weight-bear as tolerated in her fracture.  Start doing Thera-Band exercises for strengthening. -Return in about 2 to 3 weeks for recheck and x-rays.  Anticipate discontinuing fracture boot at that point.     Procedures: No procedures performed  No notes on file     PMFS History: Patient Active Problem List   Diagnosis Date Noted   Uncontrolled type 2 diabetes mellitus with hyperglycemia, without long-term current use of insulin (HCC) 04/16/2020   CVA (cerebral vascular accident) (HCC) 04/15/2020   Cocaine use 04/15/2020   Acute CVA (cerebrovascular accident) (HCC) 03/04/2020   Stroke (cerebrum) (HCC)  03/03/2020   Weakness    Noncompliance with medication regimen    TIA (transient ischemic attack) 01/22/2019   Diabetes mellitus due to underlying condition without complications (HCC) 05/30/2014   Essential hypertension, benign 05/30/2014   Smoking 05/30/2014   Past Medical History:  Diagnosis Date   Diabetes mellitus without complication (HCC)    Hypertension    TIA (transient ischemic attack) 01/22/2019    Family History  Problem Relation Age of Onset   Diabetes Other    Hypertension Maternal Grandmother     Past Surgical History:  Procedure Laterality Date   NO PAST SURGERIES     Social History   Occupational History   Not on file  Tobacco Use   Smoking status: Current Every Day Smoker    Packs/day: 1.00    Years: 15.00    Pack years: 15.00   Smokeless tobacco: Never Used  01/24/2019 Use: Never used  Substance and Sexual Activity   Alcohol use: No   Drug use: Yes    Types: Marijuana    Comment: occasional   Sexual activity: Yes    Birth control/protection: None

## 2020-05-28 ENCOUNTER — Ambulatory Visit: Payer: Commercial Managed Care - PPO

## 2020-05-29 ENCOUNTER — Encounter: Payer: Commercial Managed Care - PPO | Admitting: Physical Therapy

## 2020-05-30 ENCOUNTER — Other Ambulatory Visit: Payer: Self-pay

## 2020-05-30 ENCOUNTER — Ambulatory Visit: Payer: Commercial Managed Care - PPO | Attending: Internal Medicine

## 2020-05-30 DIAGNOSIS — R296 Repeated falls: Secondary | ICD-10-CM

## 2020-05-30 DIAGNOSIS — I69354 Hemiplegia and hemiparesis following cerebral infarction affecting left non-dominant side: Secondary | ICD-10-CM | POA: Diagnosis not present

## 2020-05-30 DIAGNOSIS — R41840 Attention and concentration deficit: Secondary | ICD-10-CM | POA: Insufficient documentation

## 2020-05-30 DIAGNOSIS — I69854 Hemiplegia and hemiparesis following other cerebrovascular disease affecting left non-dominant side: Secondary | ICD-10-CM | POA: Diagnosis present

## 2020-05-30 DIAGNOSIS — M25672 Stiffness of left ankle, not elsewhere classified: Secondary | ICD-10-CM | POA: Diagnosis present

## 2020-05-30 DIAGNOSIS — M25512 Pain in left shoulder: Secondary | ICD-10-CM | POA: Diagnosis present

## 2020-05-30 DIAGNOSIS — R278 Other lack of coordination: Secondary | ICD-10-CM | POA: Insufficient documentation

## 2020-05-30 DIAGNOSIS — R2689 Other abnormalities of gait and mobility: Secondary | ICD-10-CM | POA: Insufficient documentation

## 2020-05-30 DIAGNOSIS — R2681 Unsteadiness on feet: Secondary | ICD-10-CM | POA: Diagnosis present

## 2020-05-30 DIAGNOSIS — M6281 Muscle weakness (generalized): Secondary | ICD-10-CM | POA: Diagnosis present

## 2020-05-30 DIAGNOSIS — M25612 Stiffness of left shoulder, not elsewhere classified: Secondary | ICD-10-CM | POA: Insufficient documentation

## 2020-05-30 NOTE — Therapy (Signed)
[facility] [address] [place], [place], [address] Phone: [phone]   Fax:  [phone]  Physical Therapy Treatment  Patient Details  Name: Ebony Matthews MRN: [id number] Date of Birth: 12-03-1980 Referring Provider (PT): Referred by: Georgiann Cocker, MD. Followed by Shon Millet, MD   Encounter Date: 05/30/2020   PT End of Session - 05/30/20 1022    Visit Number 5    Number of Visits 16    Date for PT Re-Evaluation 08/21/20   POC for 8 weeks, Cert for 90 days   Authorization Type U[employer]    PT Start Time 1017    PT Stop Time 1059    PT Time Calculation (min) 42 min    Equipment Utilized During Treatment Gait belt;Other (comment)   Swedish Knee Cage   Activity Tolerance Patient tolerated treatment well    Behavior During Therapy [facility] for tasks assessed/performed;Flat affect           Past Medical History:  Diagnosis Date  . Diabetes mellitus without complication (HCC)   . Hypertension   . TIA (transient ischemic attack) 01/22/2019    Past Surgical History:  Procedure Laterality Date  . NO PAST SURGERIES      There were no vitals filed for this visit.   Subjective Assessment - 05/30/20 1021    Subjective Patient had appointment with MD reports have to wear boot for 2-3 weeks. No other complaints/concerns since last visit. No falls. No pain to report    Patient is accompained by: Family member    Pertinent History hx of multiple CVA, DM, HTN, drug abuse    Limitations Standing;Walking;House hold activities;Lifting    How long can you sit comfortably? no issues    How long can you stand comfortably? <30 sec    How long can you walk comfortably? <10 feet with quad cane    Patient Stated Goals Get my balance better.    Currently in Pain? No/denies                             [facility] Adult PT Treatment/Exercise - 05/30/20 0001      Transfers   Transfers Sit to Stand;Stand to Sit;Stand Pivot  Transfers    Sit to Stand 4: Min assist    Sit to Stand Details Tactile cues for initiation;Tactile cues for weight shifting;Tactile cues for placement;Visual cues/gestures for sequencing;Verbal cues for safe use of DME/AE    Sit to Stand Details (indicate cue type and reason) completed from various surfaces including w/c, mat. Pt require verbal cues to position L foot in proper position prior to standing due to decreased awareness.     Stand to Sit 4: Min assist    Stand to Sit Details (indicate cue type and reason) Verbal cues for technique;Verbal cues for precautions/safety    Stand to Sit Details decreased controlled with descent, when sitting followed ambulating/transfer with RW. Patient require verbal cues to remove L hand from hand grip attachment due to decreased awareness.     Squat Pivot Transfers 4: Min assist    Squat Pivot Transfer Details (indicate cue type and reason) completed transfer x 2 reps from mat <> w/c with RW with L hand grip attachment to promote improved transfers. Patient demo difficulty with sequencing stepping and turns with tranfers. require max verbal cues for proper completion    Comments all transfers completed with RW with L hand grip attachment and  L swedish knee cage applied.       Ambulation/Gait   Ambulation/Gait Yes    Ambulation/Gait Assistance 4: Min assist;4: Min guard    Ambulation/Gait Assistance Details Patient continues to demonstrate decreased awareness with ambulation. Ambulation today completed with RW with L hand grip attachment and L swedish knee cage. Patient demo decreased awareness with negotiation of RW, requiring heavy verbal cues to ensure turning to avoid hitting obstacles. Min A required for swing phase to faciliate bringing LLE through with ambulation. PT also providing assistance at knee to avoid hyperextension as patient demo decreased quad control.  Verbal cues to unstrap hand prior to descent upon completion of ambulation. Patient is  currently WBAT as tolerated with CAM boot on L foot.      Ambulation Distance (Feet) 41 Feet    Assistive device Rolling walker   with L hand grip sttachment, L swedish knee cage   Gait Pattern Step-to pattern;Decreased arm swing - left;Decreased arm swing - right;Decreased step length - left;Decreased stance time - right;Decreased hip/knee flexion - right;Decreased hip/knee flexion - left;Decreased dorsiflexion - left;Decreased weight shift to left    Ambulation Surface Level;Indoor      Neuro Re-ed    Neuro Re-ed Details  Standing with RW with mirror placed in front for visual feedback, completed weight shifts to L side x 4 minutes. PT providing manual facilation at pelvis and shoulders for proper alignment and improved weight shift to LLE. With completion of weight shift patient demo decreased control in L knee often require assistance from PT to stabilize. Weight shifts completed without swedish knee cage donned.       Exercises   Exercises Other Exercises    Other Exercises  In supine position: completed heel slides with towel under LLE, patient completed x 10 reps. patient demo good flexion with completion, but limited to no activiation noted with extension, requiring Min A for completion. Attempted quad sets with LLE with towel, completed x 5 reps with no activation noted, patient often demo on RLE.                     PT Short Term Goals - 05/23/20 1746      PT SHORT TERM GOAL #1   Title Patient will report independence with HEP with caregiver asssitance (All STG: 06/13/20)    Baseline HEP established    Time 4    Period Weeks    Status Revised    Target Date 06/13/20      PT SHORT TERM GOAL #2   Title Pt will be be able to demonstrate ability to complete transfers from w/c to mat w/ LRAD with CGA to demo improved functional mobility    Baseline Requires min A and multiple verbal and tactile cues    Time 3    Period Weeks    Status Revised      PT SHORT TERM GOAL #3     Title Patient will demo ability to stand for at least 5 minutes without UE support to demo improved tolerance for standing to allow for completion of ADL's    Baseline Supervision for 1-2 minutes, RUE Support    Time 4    Period Weeks    Status Revised      PT SHORT TERM GOAL #4   Title Patient will demo ability to ambulate 75 ft w/ LRAD and Min A to demo improved functional mobility    Baseline 14 ft, Min A  Time 4    Period Weeks    Status Revised             PT Long Term Goals - 05/23/20 1750      PT LONG TERM GOAL #1   Title Patient will demo ability to complete transfers with LRAD and supervision for improved functional mobility (ALL LTG: 07/11/20)    Baseline Min A    Time 8    Period Weeks    Status Revised    Target Date 07/11/20      PT LONG TERM GOAL #2   Title Patient will demo ability to ambulate >150 ft with LRAD and CGA to demo improved functional mobility and ambulation within the home    Baseline 14 ft, Min A    Time 8    Period Weeks    Status Revised      PT LONG TERM GOAL #3   Title Patient will demo ability to complete TUG <45 seconds to demo improved ambulation and mobility    Baseline no baseline established    Time 8    Period Weeks    Status Revised      PT LONG TERM GOAL #4   Title Patient and caregiver/family will be educated on fall prevention within the home to reduce fall risk    Baseline dependent    Time 8    Period Weeks    Status New                 Plan - 05/30/20 1145    Clinical Impression Statement Continued transfer training with RW with L Hand Grip attachment, Pt require increased verbal cues and Min A with completion of activites today. Completed gait training with RW and L swedish knee cage donned. Pt still demo recurvatum with knee cage applied require tactile cues from PT and Min A with ambulation to promote improved gait pattern. Will continue to progress toward goals.    Personal Factors and Comorbidities  Comorbidity 3+;Past/Current Experience;Time since onset of injury/illness/exacerbation;Transportation;Behavior Pattern    Comorbidities DM, HTN, hx of multiple strokes in last 6 months    Examination-Activity Limitations Bathing;Bed Mobility;Caring for Others;Carry;Dressing;Hygiene/Grooming;Lift;Reach Overhead;Squat    Examination-Participation Restrictions Cleaning;Community Activity;Driving;Laundry;Yard Work;Occupation;Church;Meal Prep    Rehab Potential Good    PT Frequency 2x / week    PT Duration 8 weeks    PT Treatment/Interventions ADLs/Self Care Home Management;Gait training;Stair training;Functional mobility training;Therapeutic activities;Therapeutic exercise;Balance training;Neuromuscular re-education;Manual techniques;Orthotic Fit/Training;Patient/family education;Cognitive remediation;Passive range of motion;Energy conservation;DME Instruction;Electrical Stimulation    PT Next Visit Plan Continue to work on transfers. Activites to promote quad actvitiation. Continue gait w/ Swedish Knee Cage for recurvatum? Did we recieve OT referral?    Consulted and Agree with Plan of Care Patient;Family member/caregiver    Family Member Consulted Aunt           Patient will benefit from skilled therapeutic intervention in order to improve the following deficits and impairments:  Abnormal gait, Decreased activity tolerance, Decreased balance, Decreased cognition, Decreased coordination, Decreased endurance, Decreased knowledge of precautions, Decreased mobility, Decreased range of motion, Decreased safety awareness, Decreased strength, Difficulty walking, Impaired perceived functional ability, Impaired flexibility, Impaired sensation, Impaired tone, Impaired UE functional use, Postural dysfunction, Pain  Visit Diagnosis: Hemiplegia and hemiparesis following cerebral infarction affecting left non-dominant side (HCC)  Other abnormalities of gait and mobility  Unsteadiness on feet  Stiffness of  left ankle, not elsewhere classified  Repeated falls     Problem List Patient Active  Problem List   Diagnosis Date Noted  . Uncontrolled type 2 diabetes mellitus with hyperglycemia, without long-term current use of insulin (HCC) 04/16/2020  . CVA (cerebral vascular accident) (HCC) 04/15/2020  . Cocaine use 04/15/2020  . Acute CVA (cerebrovascular accident) (HCC) 03/04/2020  . Stroke (cerebrum) (HCC) 03/03/2020  . Weakness   . Noncompliance with medication regimen   . TIA (transient ischemic attack) 01/22/2019  . Diabetes mellitus due to underlying condition without complications (HCC) 05/30/2014  . Essential hypertension, benign 05/30/2014  . Smoking 05/30/2014    Tempie Donning, PT, DPT 05/30/2020, 12:44 PM  [facility] [address] [place], [place], [address] Phone: [phone]   Fax:  [phone]  Name: Murel Wigle MRN: [id number] Date of Birth: 1981-05-08

## 2020-05-31 ENCOUNTER — Encounter: Payer: Commercial Managed Care - PPO | Admitting: Physical Therapy

## 2020-06-01 ENCOUNTER — Encounter: Payer: Self-pay | Admitting: Physical Therapy

## 2020-06-01 ENCOUNTER — Ambulatory Visit: Payer: Commercial Managed Care - PPO | Admitting: Physical Therapy

## 2020-06-01 ENCOUNTER — Ambulatory Visit: Payer: Commercial Managed Care - PPO

## 2020-06-01 ENCOUNTER — Other Ambulatory Visit: Payer: Self-pay

## 2020-06-01 VITALS — BP 119/76 | HR 106

## 2020-06-01 DIAGNOSIS — R296 Repeated falls: Secondary | ICD-10-CM

## 2020-06-01 DIAGNOSIS — M25672 Stiffness of left ankle, not elsewhere classified: Secondary | ICD-10-CM

## 2020-06-01 DIAGNOSIS — I69354 Hemiplegia and hemiparesis following cerebral infarction affecting left non-dominant side: Secondary | ICD-10-CM | POA: Diagnosis not present

## 2020-06-01 DIAGNOSIS — R2681 Unsteadiness on feet: Secondary | ICD-10-CM

## 2020-06-01 DIAGNOSIS — R2689 Other abnormalities of gait and mobility: Secondary | ICD-10-CM

## 2020-06-01 NOTE — Therapy (Signed)
[facility] [address] [place], [place], [address] Phone: [phone]   Fax:  [phone]  Physical Therapy Treatment  Patient Details  Name: Ebony Matthews MRN: [id number] Date of Birth: Jun 17, 1981 Referring Provider (PT): Referred by: Georgiann Cocker, MD. Followed by Shon Millet, MD   Encounter Date: 06/01/2020   PT End of Session - 06/01/20 1221    Visit Number 6    Number of Visits 16    Date for PT Re-Evaluation 08/21/20   POC for 8 weeks, Cert for 90 days   Authorization Type UHC    PT Start Time 0932    PT Stop Time 1015    PT Time Calculation (min) 43 min    Equipment Utilized During Treatment Gait belt;Other (comment)   Swedish Knee Cage   Activity Tolerance Patient tolerated treatment well    Behavior During Therapy [facility] for tasks assessed/performed;Flat affect           Past Medical History:  Diagnosis Date  . Diabetes mellitus without complication (HCC)   . Hypertension   . TIA (transient ischemic attack) 01/22/2019    Past Surgical History:  Procedure Laterality Date  . NO PAST SURGERIES      Vitals:   06/01/20 1008  BP: 119/76  Pulse: (!) 106     Subjective Assessment - 06/01/20 0936    Subjective Denies any falls or changes.    Patient is accompained by: Family member    Pertinent History hx of multiple CVA, DM, HTN, drug abuse    Limitations Standing;Walking;House hold activities;Lifting    How long can you sit comfortably? no issues    How long can you stand comfortably? <30 sec    How long can you walk comfortably? <10 feet with quad cane    Patient Stated Goals Get my balance better.    Currently in Pain? No/denies                             [facility] Adult PT Treatment/Exercise - 06/01/20 0001      Transfers   Transfers Sit to Stand;Stand to Sit;Stand Pivot Transfers    Sit to Stand 4: Min assist    Sit to Stand Details Tactile cues for initiation;Tactile  cues for weight shifting;Tactile cues for placement;Visual cues/gestures for sequencing;Verbal cues for safe use of DME/AE    Sit to Stand Details (indicate cue type and reason) from w/c.  Needs assistance for L foot placement prior to standing    Stand to Sit 4: Min assist    Stand to Sit Details (indicate cue type and reason) Verbal cues for technique;Verbal cues for precautions/safety    Stand to Sit Details decreased control and needs cues for safety and hand placement      Ambulation/Gait   Ambulation/Gait Yes    Ambulation/Gait Assistance 4: Min assist    Ambulation/Gait Assistance Details worked on ambulation in // bars with RUE support.  Poor foot clearance on L along with scissors LLE/poor placement.  L knee recurvatum even with knee cage.  Assist and cues to weight shift.      Ambulation Distance (Feet) 20 Feet   x 5 in // bars   Assistive device Parallel bars;Other (Comment)   Knee cage on L and L CAM boot   Gait Pattern Step-to pattern;Decreased arm swing - left;Decreased arm swing - right;Decreased step length - left;Decreased stance time - right;Decreased hip/knee flexion -  right;Decreased hip/knee flexion - left;Decreased dorsiflexion - left;Decreased weight shift to left    Ambulation Surface Level;Indoor    Pre-Gait Activities weight shifting in // bars with mirror for visual feedback on WS and trunk position.  Needs assist for weight shift as well.      Exercises   Exercises Knee/Hip      Knee/Hip Exercises: Seated   Long [place] [address] [place];Left;Limitations    Long [employer] Limitations Minimal contraction of quads in sitting.  Tried quad sets with little activation in this position either.    Heel Slides Left;AAROM;10 reps;Limitations    Heel Slides Limitations pillow case under LLE and assist of PTA                    PT Short Term Goals - 05/23/20 1746      PT SHORT TERM GOAL #1   Title Patient will report independence with HEP with caregiver asssitance (All  STG: 06/13/20)    Baseline HEP established    Time 4    Period Weeks    Status Revised    Target Date 06/13/20      PT SHORT TERM GOAL #2   Title Pt will be be able to demonstrate ability to complete transfers from w/c to mat w/ LRAD with CGA to demo improved functional mobility    Baseline Requires min A and multiple verbal and tactile cues    Time 3    Period Weeks    Status Revised      PT SHORT TERM GOAL #3   Title Patient will demo ability to stand for at least 5 minutes without UE support to demo improved tolerance for standing to allow for completion of ADL's    Baseline Supervision for 1-2 minutes, RUE Support    Time 4    Period Weeks    Status Revised      PT SHORT TERM GOAL #4   Title Patient will demo ability to ambulate 75 ft w/ LRAD and Min A to demo improved functional mobility    Baseline 14 ft, Min A    Time 4    Period Weeks    Status Revised             PT Long Term Goals - 05/23/20 1750      PT LONG TERM GOAL #1   Title Patient will demo ability to complete transfers with LRAD and supervision for improved functional mobility (ALL LTG: 07/11/20)    Baseline Min A    Time 8    Period Weeks    Status Revised    Target Date 07/11/20      PT LONG TERM GOAL #2   Title Patient will demo ability to ambulate >150 ft with LRAD and CGA to demo improved functional mobility and ambulation within the home    Baseline 14 ft, Min A    Time 8    Period Weeks    Status Revised      PT LONG TERM GOAL #3   Title Patient will demo ability to complete TUG <45 seconds to demo improved ambulation and mobility    Baseline no baseline established    Time 8    Period Weeks    Status Revised      PT LONG TERM GOAL #4   Title Patient and caregiver/family will be educated on fall prevention within the home to reduce fall risk    Baseline dependent    Time  8    Period Weeks    Status New                 Plan - 06/01/20 1221    Clinical Impression  Statement Session focused on mainly gait and pregait in // bars.  Pt with difficulty with LLE foot placement and advancement which is somewhat impacted by CAM boot.  Continues with L knee recurvatum even with knee cage.  Cont per POC.    Personal Factors and Comorbidities Comorbidity 3+;Past/Current Experience;Time since onset of injury/illness/exacerbation;Transportation;Behavior Pattern    Comorbidities DM, HTN, hx of multiple strokes in last 6 months    Examination-Activity Limitations Bathing;Bed Mobility;Caring for Others;Carry;Dressing;Hygiene/Grooming;Lift;Reach Overhead;Squat    Examination-Participation Restrictions Cleaning;Community Activity;Driving;Laundry;Yard Work;Occupation;Church;Meal Prep    Rehab Potential Good    PT Frequency 2x / week    PT Duration 8 weeks    PT Treatment/Interventions ADLs/Self Care Home Management;Gait training;Stair training;Functional mobility training;Therapeutic activities;Therapeutic exercise;Balance training;Neuromuscular re-education;Manual techniques;Orthotic Fit/Training;Patient/family education;Cognitive remediation;Passive range of motion;Energy conservation;DME Instruction;Electrical Stimulation    PT Next Visit Plan Continue to work on transfers. Activites to promote quad actvitiation. Continue gait w/ Swedish Knee Cage for recurvatum?    Consulted and Agree with Plan of Care Patient;Family member/caregiver    Family Member Consulted Aunt           Patient will benefit from skilled therapeutic intervention in order to improve the following deficits and impairments:  Abnormal gait, Decreased activity tolerance, Decreased balance, Decreased cognition, Decreased coordination, Decreased endurance, Decreased knowledge of precautions, Decreased mobility, Decreased range of motion, Decreased safety awareness, Decreased strength, Difficulty walking, Impaired perceived functional ability, Impaired flexibility, Impaired sensation, Impaired tone, Impaired UE  functional use, Postural dysfunction, Pain  Visit Diagnosis: Hemiplegia and hemiparesis following cerebral infarction affecting left non-dominant side (HCC)  Other abnormalities of gait and mobility  Unsteadiness on feet  Stiffness of left ankle, not elsewhere classified  Repeated falls     Problem List Patient Active Problem List   Diagnosis Date Noted  . Uncontrolled type 2 diabetes mellitus with hyperglycemia, without long-term current use of insulin (HCC) 04/16/2020  . CVA (cerebral vascular accident) (HCC) 04/15/2020  . Cocaine use 04/15/2020  . Acute CVA (cerebrovascular accident) (HCC) 03/04/2020  . Stroke (cerebrum) (HCC) 03/03/2020  . Weakness   . Noncompliance with medication regimen   . TIA (transient ischemic attack) 01/22/2019  . Diabetes mellitus due to underlying condition without complications (HCC) 05/30/2014  . Essential hypertension, benign 05/30/2014  . Smoking 05/30/2014    Newell Coral, PTA [facility] 06/01/20 12:31 PM Phone: [phone] Fax: [phone]   [facility] [address] [place], [place], [address] Phone: [phone]   Fax:  [phone]  Name: Makalya Nave MRN: [id number] Date of Birth: Sep 24, 1981

## 2020-06-05 ENCOUNTER — Ambulatory Visit: Payer: Commercial Managed Care - PPO

## 2020-06-05 ENCOUNTER — Encounter: Payer: Commercial Managed Care - PPO | Admitting: Physical Therapy

## 2020-06-05 ENCOUNTER — Other Ambulatory Visit: Payer: Self-pay

## 2020-06-05 DIAGNOSIS — R2689 Other abnormalities of gait and mobility: Secondary | ICD-10-CM

## 2020-06-05 DIAGNOSIS — R2681 Unsteadiness on feet: Secondary | ICD-10-CM

## 2020-06-05 DIAGNOSIS — I69354 Hemiplegia and hemiparesis following cerebral infarction affecting left non-dominant side: Secondary | ICD-10-CM

## 2020-06-05 DIAGNOSIS — M25672 Stiffness of left ankle, not elsewhere classified: Secondary | ICD-10-CM

## 2020-06-05 NOTE — Therapy (Signed)
[facility] [address] [place], [place], [address] Phone: [phone]   Fax:  [phone]  Physical Therapy Treatment  Patient Details  Name: Ebony Matthews MRN: [id number] Date of Birth: 1981-02-14 Referring Provider (PT): Referred by: Georgiann Cocker, MD. Followed by Shon Millet, MD   Encounter Date: 06/05/2020   PT End of Session - 06/05/20 1021    Visit Number 7    Number of Visits 16    Date for PT Re-Evaluation 08/21/20   POC for 8 weeks, Cert for 90 days   Authorization Type [employer]    PT Start Time 1016    PT Stop Time 1058    PT Time Calculation (min) 42 min    Equipment Utilized During Treatment Gait belt;Other (comment)   Swedish Knee Cage   Activity Tolerance Patient tolerated treatment well    Behavior During Therapy [facility] for tasks assessed/performed;Flat affect           Past Medical History:  Diagnosis Date  . Diabetes mellitus without complication (HCC)   . Hypertension   . TIA (transient ischemic attack) 01/22/2019    Past Surgical History:  Procedure Laterality Date  . NO PAST SURGERIES      There were no vitals filed for this visit.   Subjective Assessment - 06/05/20 1019    Subjective Patient reports no new changes/complaints since last visit. No falls to report. No pain.    Patient is accompained by: Family member    Pertinent History hx of multiple CVA, DM, HTN, drug abuse    Limitations Standing;Walking;House hold activities;Lifting    How long can you sit comfortably? no issues    How long can you stand comfortably? <30 sec    How long can you walk comfortably? <10 feet with quad cane    Patient Stated Goals Get my balance better.    Currently in Pain? No/denies                             [facility] Adult PT Treatment/Exercise - 06/05/20 0001      Transfers   Transfers Sit to Stand;Stand to Sit;Stand Pivot Transfers    Sit to Stand 4: Min assist    Sit to Stand  Details Tactile cues for initiation;Tactile cues for weight shifting;Tactile cues for placement;Visual cues/gestures for sequencing;Verbal cues for safe use of DME/AE    Sit to Stand Details (indicate cue type and reason) from w/c, utilizing UE support on single // bars. require assistance with LLE placement and alignment prior to standing.     Stand to Sit 4: Min assist    Stand to Sit Details (indicate cue type and reason) Verbal cues for technique;Verbal cues for precautions/safety    Stand to Sit Details decreased control with descent. verbal cues for hand placement      Ambulation/Gait   Ambulation/Gait Yes    Ambulation/Gait Assistance 4: Min assist    Ambulation/Gait Assistance Details completed ambulation within // bars focused on improved step length and control of LLE. decreased knee recurvatum noted with knee cage and tactile/verbal cues to keep slight bend in knee with ambulaiton. Patient still demo difficulty with control/placement of LLE with swing phase. scissoring noted at times. All ambulation completed with single UE support on R.     Ambulation Distance (Feet) 20 Feet   x 7 reps   Assistive device Parallel bars;Other (Comment)   CAM Boot and Knee Cage  Gait Pattern Step-to pattern;Decreased arm swing - left;Decreased arm swing - right;Decreased step length - left;Decreased stance time - right;Decreased hip/knee flexion - right;Decreased hip/knee flexion - left;Decreased dorsiflexion - left;Decreased weight shift to left    Ambulation Surface Level;Indoor      Neuro Re-ed    Neuro Re-ed Details  at // bars with mirror in front for visual feedback: completed lateral weight shifting to LLE, PT providing tactile cues at pelvis for improved weight shift. PT also providing min A for alignment at LLE due to decreased awareness and patient often keeping foot in ER position. Completed weight shifts x 15 reps. PT providing verbal cues for improved alignemnt, and progressed to completing  forward and backward steps with RLE to promote weight shift to LLE, completed x 10 reps. PT providing Min A with completion, and completed with single UE support on R.       Exercises   Exercises Knee/Hip      Knee/Hip Exercises: Seated   Heel Slides Left;AAROM;10 reps;Limitations;Strengthening    Heel Slides Limitations completed with towel under LLE and completed, require assistance from PT with knee extension but able to complete flexion with CGA. verbal//tactle cues required from PT for proper completion. Educated to complete daily at home with caregiver assistance.                     PT Short Term Goals - 05/23/20 1746      PT SHORT TERM GOAL #1   Title Patient will report independence with HEP with caregiver asssitance (All STG: 06/13/20)    Baseline HEP established    Time 4    Period Weeks    Status Revised    Target Date 06/13/20      PT SHORT TERM GOAL #2   Title Pt will be be able to demonstrate ability to complete transfers from w/c to mat w/ LRAD with CGA to demo improved functional mobility    Baseline Requires min A and multiple verbal and tactile cues    Time 3    Period Weeks    Status Revised      PT SHORT TERM GOAL #3   Title Patient will demo ability to stand for at least 5 minutes without UE support to demo improved tolerance for standing to allow for completion of ADL's    Baseline Supervision for 1-2 minutes, RUE Support    Time 4    Period Weeks    Status Revised      PT SHORT TERM GOAL #4   Title Patient will demo ability to ambulate 75 ft w/ LRAD and Min A to demo improved functional mobility    Baseline 14 ft, Min A    Time 4    Period Weeks    Status Revised             PT Long Term Goals - 05/23/20 1750      PT LONG TERM GOAL #1   Title Patient will demo ability to complete transfers with LRAD and supervision for improved functional mobility (ALL LTG: 07/11/20)    Baseline Min A    Time 8    Period Weeks    Status Revised      Target Date 07/11/20      PT LONG TERM GOAL #2   Title Patient will demo ability to ambulate >150 ft with LRAD and CGA to demo improved functional mobility and ambulation within the home    Baseline 14  ft, Min A    Time 8    Period Weeks    Status Revised      PT LONG TERM GOAL #3   Title Patient will demo ability to complete TUG <45 seconds to demo improved ambulation and mobility    Baseline no baseline established    Time 8    Period Weeks    Status Revised      PT LONG TERM GOAL #4   Title Patient and caregiver/family will be educated on fall prevention within the home to reduce fall risk    Baseline dependent    Time 8    Period Weeks    Status New                 Plan - 06/05/20 1414    Clinical Impression Statement Today's session focused on continued gait training in // bars with knee cage. With verbal/tactile cues patient able to reduce extent of L knee recurvatum. Require verbal cues for redirection to task and awareness of LLE. Will continue to progress.    Personal Factors and Comorbidities Comorbidity 3+;Past/Current Experience;Time since onset of injury/illness/exacerbation;Transportation;Behavior Pattern    Comorbidities DM, HTN, hx of multiple strokes in last 6 months    Examination-Activity Limitations Bathing;Bed Mobility;Caring for Others;Carry;Dressing;Hygiene/Grooming;Lift;Reach Overhead;Squat    Examination-Participation Restrictions Cleaning;Community Activity;Driving;Laundry;Yard Work;Occupation;Church;Meal Prep    Rehab Potential Good    PT Frequency 2x / week    PT Duration 8 weeks    PT Treatment/Interventions ADLs/Self Care Home Management;Gait training;Stair training;Functional mobility training;Therapeutic activities;Therapeutic exercise;Balance training;Neuromuscular re-education;Manual techniques;Orthotic Fit/Training;Patient/family education;Cognitive remediation;Passive range of motion;Energy conservation;DME Instruction;Electrical  Stimulation    PT Next Visit Plan Continue to work on transfers. Activites to promote quad actvitiation. Continue gait w/ Swedish Knee Cage in // bars.    Consulted and Agree with Plan of Care Patient;Family member/caregiver    Family Member Consulted Aunt           Patient will benefit from skilled therapeutic intervention in order to improve the following deficits and impairments:  Abnormal gait, Decreased activity tolerance, Decreased balance, Decreased cognition, Decreased coordination, Decreased endurance, Decreased knowledge of precautions, Decreased mobility, Decreased range of motion, Decreased safety awareness, Decreased strength, Difficulty walking, Impaired perceived functional ability, Impaired flexibility, Impaired sensation, Impaired tone, Impaired UE functional use, Postural dysfunction, Pain  Visit Diagnosis: Other abnormalities of gait and mobility  Unsteadiness on feet  Stiffness of left ankle, not elsewhere classified  Hemiplegia and hemiparesis following cerebral infarction affecting left non-dominant side [facility])     Problem List Patient Active Problem List   Diagnosis Date Noted  . Uncontrolled type 2 diabetes mellitus with hyperglycemia, without long-term current use of insulin (HCC) 04/16/2020  . CVA (cerebral vascular accident) (HCC) 04/15/2020  . Cocaine use 04/15/2020  . Acute CVA (cerebrovascular accident) (HCC) 03/04/2020  . Stroke (cerebrum) (HCC) 03/03/2020  . Weakness   . Noncompliance with medication regimen   . TIA (transient ischemic attack) 01/22/2019  . Diabetes mellitus due to underlying condition without complications (HCC) 05/30/2014  . Essential hypertension, benign 05/30/2014  . Smoking 05/30/2014    Tempie Donning, PT, DPT 06/05/2020, 2:18 PM  [facility] [address] [place], [place], [address] Phone: [phone]   Fax:  [phone]  Name: Jalea Bronaugh MRN:  [id number] Date of Birth: July 22, 1981

## 2020-06-05 NOTE — Progress Notes (Signed)
NEUROLOGY CONSULTATION NOTE  Ebony Matthews MRN: [id number] DOB: 1981-04-25  Referring provider: Georgiann Cocker, NP Primary care provider: Georgiann Cocker, NP  Reason for consult:  TIA  HISTORY OF PRESENT ILLNESS: Ebony Matthews is a 39 year old right-handed female with HTN and diabetes who presents for TIA.  History supplemented by hospital and rehab notes.  MRI bran and CTA of head and neck personally reviewed.  She is accompanied by her mother who provides collateral history.  She was admitted to [facility] for 3 day history of left arm numbness and tingling and inability to grip with her left hand.  MRI of brain showed scattered multifocal subcentimeter acute ischemic infarcts involving the right frontal, parietal and temporal lobes and subcentimeter infarcts in left occipital lobe and left cerebellum.  CTA of head and neck showed narrow and tortuous bilateral cervical internal carotid arteries with mild dilation just inferior to the skull base concerning for fibromuscular dysplasia or atherosclerotic disease.  2D echocardiogram showed EF 60-65% with no cardiac source of emboli.  Labs showed LDL 202, Hgb A1c 10.5, negative Covid test, and UDS positive for cocaine and THC.  She was on ASA 325mg  daily prior to admission and was discharged on ASA 81mg  and Plavix 75mg  daily for 3 weeks, followed by ASA alone (not changed to Plavix as patient had been noncompliant on ASA).  She was on Lipitor 40mg  daily prior to admission and continued at discharge as she had been noncompliant.  Further testing for cardiac source of emboli, including TEE and loop recorder, was not performed as she was thought to not be a good candidate for anticoagulation due to noncompliance.    On 04/14/2020, she developed worsening left arm and new left leg weakness.  She was seen in the hospital the following day.  MRI of brain showed new acute infarcts in the right cerebral hemisphere and small infarct in right pons.   CTA of head and neck showed new acute proximal right ICA occlusion with distal reconstitution at the supraclinoid segment via collateral flow across the circle-of-Willis.  Right MCA is perfused although attenuated as compared to the contralateral left MCA.  COVID testing was negative.  She was advised to remain on dual antiplatelet therapy for 3 months followed by Plavix alone.    She is undergoing physical therapy twice a week.  She has since quit smoking and has been compliant with her medication.  She works at but has been out of work since stroke in July.  Current medications:  ASA 81mg ; atorvastatin 40mg , Plavix 5mg , glipizide, HCTZ, lisinopril, metformin  PAST MEDICAL HISTORY: Past Medical History:  Diagnosis Date  . Diabetes mellitus without complication (HCC)   . Hypertension   . TIA (transient ischemic attack) 01/22/2019    PAST SURGICAL HISTORY: Past Surgical History:  Procedure Laterality Date  . NO PAST SURGERIES      MEDICATIONS: Current Outpatient Medications on File Prior to Visit  Medication Sig Dispense Refill  . aspirin EC 81 MG tablet Take 1 tablet (81 mg total) by mouth daily.    04/16/2020 atorvastatin (LIPITOR) 40 MG tablet Take 40 mg by mouth at bedtime.    [employer] atorvastatin (LIPITOR) 80 MG tablet Take 0.5 tablets (40 mg total) by mouth daily at 6 PM. 30 tablet 0  . Cholecalciferol (VITAMIN D-3) 125 MCG (5000 UT) TABS Take 1 tablet by mouth daily. 90 tablet 3  . clopidogrel (PLAVIX) 75 MG tablet Take 1 tablet (75 mg total) by  mouth daily. X 21 days 21 tablet 0  . glipiZIDE (GLUCOTROL) 5 MG tablet Take 1 tablet (5 mg total) by mouth 2 (two) times daily before a meal. 60 tablet 0  . hydrochlorothiazide (MICROZIDE) 12.5 MG capsule Take 1 capsule (12.5 mg total) by mouth daily. 30 capsule 0  . lisinopril (ZESTRIL) 20 MG tablet Take 1 tablet (20 mg total) by mouth daily. 30 tablet 0  . Menatetrenone (VITAMIN K2) 100 MCG TABS Take 100 mcg by mouth daily. 90 tablet 3  .  metFORMIN (GLUCOPHAGE) 1000 MG tablet Take 1 tablet (1,000 mg total) by mouth 2 (two) times daily. 60 tablet 0  . naproxen sodium (ALEVE) 220 MG tablet Take 220-440 mg by mouth 2 (two) times daily as needed (for pain).     No current facility-administered medications on file prior to visit.    ALLERGIES: No Known Allergies  FAMILY HISTORY: Family History  Problem Relation Age of Onset  . Diabetes Other   . Hypertension Maternal Grandmother    SOCIAL HISTORY: Social History   Socioeconomic History  . Marital status: Single    Spouse name: Not on file  . Number of children: Not on file  . Years of education: Not on file  . Highest education level: Not on file  Occupational History  . Not on file  Tobacco Use  . Smoking status: Current Every Day Smoker    Packs/day: 1.00    Years: 15.00    Pack years: 15.00  . Smokeless tobacco: Never Used  Vaping Use  . Vaping Use: Never used  Substance and Sexual Activity  . Alcohol use: No  . Drug use: Yes    Types: Marijuana    Comment: occasional  . Sexual activity: Yes    Birth control/protection: None  Other Topics Concern  . Not on file  Social History Narrative  . Not on file   Social Determinants of Health   Financial Resource Strain:   . Difficulty of Paying Living Expenses: Not on file  Food Insecurity:   . Worried About Programme researcher, broadcasting/film/video in the Last Year: Not on file  . Ran Out of Food in the Last Year: Not on file  Transportation Needs:   . Lack of Transportation (Medical): Not on file  . Lack of Transportation (Non-Medical): Not on file  Physical Activity:   . Days of Exercise per Week: Not on file  . Minutes of Exercise per Session: Not on file  Stress:   . Feeling of Stress : Not on file  Social Connections:   . Frequency of Communication with Friends and Family: Not on file  . Frequency of Social Gatherings with Friends and Family: Not on file  . Attends Religious Services: Not on file  . Active Member  of Clubs or Organizations: Not on file  . Attends Banker Meetings: Not on file  . Marital Status: Not on file  Intimate Partner Violence:   . Fear of Current or Ex-Partner: Not on file  . Emotionally Abused: Not on file  . Physically Abused: Not on file  . Sexually Abused: Not on file    PHYSICAL EXAM: Blood pressure (!) 146/92, pulse 97, height 5\' 7"  (1.702 m), weight 180 lb (81.6 kg), SpO2 100 %. General: No acute distress.  Patient appears well-groomed.  Head:  Normocephalic/atraumatic Eyes:  fundi examined but not visualized Neck: supple, no paraspinal tenderness, full range of motion Back: No paraspinal tenderness Heart: regular rate and rhythm  Lungs: Clear to auscultation bilaterally. Vascular: No carotid bruits. Neurological Exam: Mental status: alert and oriented to person, place, and time, recent and remote memory intact, fund of knowledge intact, attention and concentration intact, speech fluent and not dysarthric, language intact. Cranial nerves: CN I: not tested CN II: pupils equal, round and reactive to light.  Right gaze preference. CN III, IV, VI:  full range of motion, no nystagmus, no ptosis CN V: facial sensation intact CN VII: left lower facial weakness CN VIII: hearing intact CN IX, X: gag intact, uvula midline CN XI: sternocleidomastoid and trapezius muscles intact CN XII: tongue midline Bulk & Tone: normal, no fasciculations. Motor:  Left sided hemiplegia of upper and lower extremities except for 3/5 right hip flexors.  5/5 left upper and lower extremities. Sensation:  Pinprick sensation reduced in left upper extremity.  Vibratory sensation reduced in left lower extremity.   Deep Tendon Reflexes:  Slightly brisk in left upper and lower extremities, left Babinski, toes downgoing on right. Finger to nose testing:  Without dysmetria on right, unable to assess left. Heel to shin:  Without dysmetria on right, unable to assess left. Gait:  Deferred.   In wheelchair.  Unable to stand and walk without assistance.  IMPRESSION: 1.  Bilateral embolic acute infarcts of unknown source.  Concern for cardiac source 2.  Right hemispheric embolic infarcts with right ICA occlusion. 2.  Type 2 diabetes mellitus, uncontrolled 3.  Hypertension 4.  Hyperlipidemia 5.  Cigarette smoker.  6.  Cocaine use  Multiple stroke risk factors.  However, bilateral embolic strokes are suspicious for cardiac source.  We discussed further testing to evaluate for cardiac source of emboli, such as TEE and loop recorder.  At this time, she is open to a 4 week cardiac event monitor.  She declines TEE.  PLAN: 1.  ASA 81mg  daily and Plavix 75mg  daily.  She may discontinue ASA on October 17 and continue Plavix 75mg  daily alone. 2.  Statin therapy. LDL goal less than 70.  Repeat lipid panel today. 3.  Optimize glycemic control. Hgb A1c goal less than 7. 4.  Blood pressure control. 5.  Continue smoking/substance use cessation 6.  4 week cardiac event monitor. 7.  At this time, she cannot return to her current job at .  Will keep her out of work until re-evaluation at her follow up in 3 months.    Thank you for allowing me to take part in the care of this patient.  October 19, DO  CC:  , NP

## 2020-06-06 ENCOUNTER — Ambulatory Visit: Payer: Commercial Managed Care - PPO | Admitting: Neurology

## 2020-06-06 ENCOUNTER — Encounter: Payer: Self-pay | Admitting: Neurology

## 2020-06-06 VITALS — BP 146/92 | HR 97 | Ht 67.0 in | Wt 180.0 lb

## 2020-06-06 DIAGNOSIS — I69354 Hemiplegia and hemiparesis following cerebral infarction affecting left non-dominant side: Secondary | ICD-10-CM

## 2020-06-06 DIAGNOSIS — E1165 Type 2 diabetes mellitus with hyperglycemia: Secondary | ICD-10-CM | POA: Diagnosis not present

## 2020-06-06 DIAGNOSIS — I6349 Cerebral infarction due to embolism of other cerebral artery: Secondary | ICD-10-CM | POA: Diagnosis not present

## 2020-06-06 DIAGNOSIS — I63231 Cerebral infarction due to unspecified occlusion or stenosis of right carotid arteries: Secondary | ICD-10-CM | POA: Diagnosis not present

## 2020-06-06 DIAGNOSIS — F17200 Nicotine dependence, unspecified, uncomplicated: Secondary | ICD-10-CM

## 2020-06-06 DIAGNOSIS — I10 Essential (primary) hypertension: Secondary | ICD-10-CM

## 2020-06-06 DIAGNOSIS — E785 Hyperlipidemia, unspecified: Secondary | ICD-10-CM

## 2020-06-06 NOTE — Patient Instructions (Addendum)
1.  Continue aspirin and Plavix daily.  On October 18, you may stop aspirin and continue Plavix 75mg  daily. 2.  Continue atorvastatin 40mg  daily.  Check lipid panel 3.  Optimize sugar control 4.  4 week cardiac event monitor. 5.  Get papers/forms needed for FMLA vs short-term disability 6.  Follow up in 3 months.

## 2020-06-07 ENCOUNTER — Encounter: Payer: Commercial Managed Care - PPO | Admitting: Physical Therapy

## 2020-06-07 ENCOUNTER — Ambulatory Visit: Payer: Commercial Managed Care - PPO

## 2020-06-08 ENCOUNTER — Other Ambulatory Visit: Payer: Self-pay

## 2020-06-08 ENCOUNTER — Ambulatory Visit: Payer: Commercial Managed Care - PPO

## 2020-06-08 DIAGNOSIS — R2681 Unsteadiness on feet: Secondary | ICD-10-CM

## 2020-06-08 DIAGNOSIS — R2689 Other abnormalities of gait and mobility: Secondary | ICD-10-CM

## 2020-06-08 DIAGNOSIS — R296 Repeated falls: Secondary | ICD-10-CM

## 2020-06-08 DIAGNOSIS — I69354 Hemiplegia and hemiparesis following cerebral infarction affecting left non-dominant side: Secondary | ICD-10-CM | POA: Diagnosis not present

## 2020-06-08 DIAGNOSIS — M25672 Stiffness of left ankle, not elsewhere classified: Secondary | ICD-10-CM

## 2020-06-08 NOTE — Therapy (Signed)
[facility] [address] [place], [place], [address] Phone: [phone]   Fax:  [phone]  Physical Therapy Treatment  Patient Details  Name: Ebony Matthews MRN: [id number] Date of Birth: Mar 15, 1981 Referring Provider (PT): Referred by: Jordan Hawks, MD. Followed by Metta Clines, MD   Encounter Date: 06/08/2020   PT End of Session - 06/08/20 1133    Visit Number 8    Number of Visits 16    Date for PT Re-Evaluation 08/21/20   POC for 8 weeks, Cert for 90 days   Authorization Type [employer]    PT Start Time 1100    PT Stop Time 1115    PT Time Calculation (min) 15 min    Equipment Utilized During Treatment Gait belt;Other (comment)   Swedish Knee Cage   Activity Tolerance Patient tolerated treatment well    Behavior During Therapy [name] for tasks assessed/performed;Flat affect           Past Medical History:  Diagnosis Date  . Diabetes mellitus without complication ([place])   . Hypertension   . TIA (transient ischemic attack) 01/22/2019    Past Surgical History:  Procedure Laterality Date  . NO PAST SURGERIES      There were no vitals filed for this visit.   Subjective Assessment - 06/08/20 1121    Subjective Pt reports no new falls. no pain in L ankle.    Patient is accompained by: Family member    Pertinent History hx of multiple CVA, DM, HTN, drug abuse    Limitations Standing;Walking;House hold activities;Lifting    Currently in Pain? No/denies                 Treatment Supine hooklying manually resisted unilateral hip abduction and adduction: L only: 20x Supine Bridge: 10x Supine bridge: unilatera: 2 x 5 Supine hooklying bil manually resisted lower trunk rotations with slow reversal and quick stretch to facilitate L LE: 20x R and L SLR: 2 x 10 L only AA SL clamshells: 20x total of which 2 x 5 with 5" holds SL hip abduction: 2 x 10 L only AA Prone hamstring curls: AA to passive, quick stretch  applied to facilitate mm contraction at end range: some activation in hamstring noted: 2 x 10 Prone hip extensions: AA: 2 x 10 Standing fwd, lateral steps: 2 x 10 R and L with L Only while patient holding on to hemi walker  Standing lateral shift to left and holding equal WB: with one hand on PT shoulder: 2 x 30" Maintaining equal WB with R UE OH lift: 10x Maintaining equal WB with head/body turns each side to promote weight shifting: 10x min A required to keep L knee blocked  Worked on standing with equal WB with starting to initiate locking and unlocking of bil knees. Pt has very poor control in L knee: 3x - required mod A00                       PT Short Term Goals - 06/08/20 1134      PT SHORT TERM GOAL #1   Title Patient will report independence with HEP with caregiver asssitance (All STG: 06/13/20)    Baseline HEP established    Time 4    Period Weeks    Status Revised    Target Date 06/13/20      PT SHORT TERM GOAL #2   Title Pt will be be able to demonstrate ability to  complete transfers from w/c to mat w/ LRAD with CGA to demo improved functional mobility    Baseline Requires min A and multiple verbal and tactile cues, min A and multiple cues for proper steps and to assist moving L LE due to scissoring (06/08/20)    Time 3    Period Weeks    Status Revised      PT SHORT TERM GOAL #3   Title Patient will demo ability to stand for at least 5 minutes without UE support to demo improved tolerance for standing to allow for completion of ADL's    Baseline Supervision for 1-2 minutes, RUE Support, able to stand for 5 min performing dynamic activities but require min to mod A due to L leg buckling    Time 4    Period Weeks    Status Partially Met    Target Date 06/08/20      PT SHORT TERM GOAL #4   Title Patient will demo ability to ambulate 68 ft w/ LRAD and Min A to demo improved functional mobility    Baseline 14 ft, Min A    Time 4    Period Weeks     Status Revised    Target Date 06/08/20             PT Long Term Goals - 05/23/20 1750      PT LONG TERM GOAL #1   Title Patient will demo ability to complete transfers with LRAD and supervision for improved functional mobility (ALL LTG: 07/11/20)    Baseline Min A    Time 8    Period Weeks    Status Revised    Target Date 07/11/20      PT LONG TERM GOAL #2   Title Patient will demo ability to ambulate >150 ft with LRAD and CGA to demo improved functional mobility and ambulation within the home    Baseline 14 ft, Min A    Time 8    Period Weeks    Status Revised      PT LONG TERM GOAL #3   Title Patient will demo ability to complete TUG <45 seconds to demo improved ambulation and mobility    Baseline no baseline established    Time 8    Period Weeks    Status Revised      PT LONG TERM GOAL #4   Title Patient and caregiver/family will be educated on fall prevention within the home to reduce fall risk    Baseline dependent    Time 8    Period Weeks    Status New                 Plan - 06/08/20 1151    Clinical Impression Statement Today's skilled session was focused on performing exercises to improve strength of L leg grossly. We also worked on improving weight shift in standing to L LE to improve quad control with some dynamic activities. Patient has poor control in L knee and it buckles and requires min to mod A to maintain standing balance without UE support.    Personal Factors and Comorbidities Comorbidity 3+;Past/Current Experience;Time since onset of injury/illness/exacerbation;Transportation;Behavior Pattern    Comorbidities DM, HTN, hx of multiple strokes in last 6 months    Examination-Activity Limitations Bathing;Bed Mobility;Caring for Others;Carry;Dressing;Hygiene/Grooming;Lift;Reach Overhead;Squat    Examination-Participation Restrictions Cleaning;Community Activity;Driving;Laundry;Yard Work;Occupation;Church;Meal Prep    Rehab Potential Good    PT  Frequency 2x / week    PT Duration 8  weeks    PT Treatment/Interventions ADLs/Self Care Home Management;Gait training;Stair training;Functional mobility training;Therapeutic activities;Therapeutic exercise;Balance training;Neuromuscular re-education;Manual techniques;Orthotic Fit/Training;Patient/family education;Cognitive remediation;Passive range of motion;Energy conservation;DME Instruction;Electrical Stimulation    PT Next Visit Plan Continue to work on transfers. Activites to promote quad actvitiation. Continue gait w/ Swedish Knee Cage in // bars.    Consulted and Agree with Plan of Care Patient;Family member/caregiver    Family Member Consulted Aunt           Patient will benefit from skilled therapeutic intervention in order to improve the following deficits and impairments:  Abnormal gait, Decreased activity tolerance, Decreased balance, Decreased cognition, Decreased coordination, Decreased endurance, Decreased knowledge of precautions, Decreased mobility, Decreased range of motion, Decreased safety awareness, Decreased strength, Difficulty walking, Impaired perceived functional ability, Impaired flexibility, Impaired sensation, Impaired tone, Impaired UE functional use, Postural dysfunction, Pain  Visit Diagnosis: Other abnormalities of gait and mobility  Unsteadiness on feet  Stiffness of left ankle, not elsewhere classified  Hemiplegia and hemiparesis following cerebral infarction affecting left non-dominant side (HCC)  Repeated falls     Problem List Patient Active Problem List   Diagnosis Date Noted  . Uncontrolled type 2 diabetes mellitus with hyperglycemia, without long-term current use of insulin ([place]) 04/16/2020  . CVA (cerebral vascular accident) ([place]) 04/15/2020  . Cocaine use 04/15/2020  . Acute CVA (cerebrovascular accident) ([place]) 03/04/2020  . Stroke (cerebrum) ([place]) 03/03/2020  . Weakness   . Noncompliance with medication regimen   . TIA (transient  ischemic attack) 01/22/2019  . Diabetes mellitus due to underlying condition without complications ([place]) 37/48/2707  . Essential hypertension, benign 05/30/2014  . Smoking 05/30/2014    Kerrie Pleasure 06/08/2020, 11:53 AM  [facility] [address] [place], [place], [address] Phone: [phone]   Fax:  [phone]  Name: Ebony Matthews MRN: [id number] Date of Birth: [birth year]

## 2020-06-11 ENCOUNTER — Ambulatory Visit: Payer: Commercial Managed Care - PPO

## 2020-06-12 ENCOUNTER — Ambulatory Visit: Payer: Commercial Managed Care - PPO

## 2020-06-13 ENCOUNTER — Ambulatory Visit: Payer: Commercial Managed Care - PPO

## 2020-06-13 ENCOUNTER — Other Ambulatory Visit: Payer: Self-pay

## 2020-06-13 DIAGNOSIS — I69354 Hemiplegia and hemiparesis following cerebral infarction affecting left non-dominant side: Secondary | ICD-10-CM

## 2020-06-13 DIAGNOSIS — M25672 Stiffness of left ankle, not elsewhere classified: Secondary | ICD-10-CM

## 2020-06-13 DIAGNOSIS — R296 Repeated falls: Secondary | ICD-10-CM

## 2020-06-13 DIAGNOSIS — R2689 Other abnormalities of gait and mobility: Secondary | ICD-10-CM

## 2020-06-13 DIAGNOSIS — R2681 Unsteadiness on feet: Secondary | ICD-10-CM

## 2020-06-13 NOTE — Therapy (Signed)
[facility] [address] [place], [place], [address] Phone: [phone]   Fax:  [phone]  Physical Therapy Treatment  Patient Details  Name: Ebony Matthews MRN: [id number] Date of Birth: September 27, 1981 Referring Provider (PT): Referred by: Jordan Hawks, MD. Followed by Metta Clines, MD   Encounter Date: 06/13/2020   PT End of Session - 06/13/20 1533    Visit Number 9    Number of Visits 16    Date for PT Re-Evaluation 08/21/20    Authorization Type [employer]    PT Start Time 1017    PT Stop Time 1530    PT Time Calculation (min) 45 min    Equipment Utilized During Treatment Gait belt;Other (comment)    Activity Tolerance Patient tolerated treatment well    Behavior During Therapy [facility] for tasks assessed/performed;Flat affect           Past Medical History:  Diagnosis Date  . Diabetes mellitus without complication ([place])   . Hypertension   . TIA (transient ischemic attack) 01/22/2019    Past Surgical History:  Procedure Laterality Date  . NO PAST SURGERIES      There were no vitals filed for this visit.   Subjective Assessment - 06/13/20 1514    Subjective Pt reports no new falls. no pain in L ankle.    Patient is accompained by: Family member    Pertinent History hx of multiple CVA, DM, HTN, drug abuse    Limitations Standing;Walking;House hold activities;Lifting                Gait training: 1 x 130' with hemi walker, L walking boot and plastic bag over walking boot to allow for it to slide. Cues to not let legs scissor, pt tends to hyperextend L knee for stability  SLR: 2 x 10 Supine hip abduction: 2 x 10 R and L Supine manually resisted heel slides and leg press: 20x L only Supine manually resisted slow reversal with quick stretch: bil ankle DF and PF: 20x-no muscle activation for ankle dorsiflexors and plantarflexors noted Supine manually resisted ankle inversion/hip internal rotation to ankle  eversion/hip erectile dysfunction: 20x slow reversal with quick stretch to facilitate mm contraction and irradiation Supine hooklying R hip abduction and adduction: slow reversal with quick stretch: 20x Supine isometric hold in neutral hooklying: PT positioned his hand around knee and patient to keep knee from touching her knee to PT's hands: 2 x 30" Supine bridge: 10x, with PT's hands taround knee and pt to maintain neutral hip positioning in L hip while bridging                       PT Short Term Goals - 06/08/20 1134      PT SHORT TERM GOAL #1   Title Patient will report independence with HEP with caregiver asssitance (All STG: 06/13/20)    Baseline HEP established    Time 4    Period Weeks    Status Revised    Target Date 06/13/20      PT SHORT TERM GOAL #2   Title Pt will be be able to demonstrate ability to complete transfers from w/c to mat w/ LRAD with CGA to demo improved functional mobility    Baseline Requires min A and multiple verbal and tactile cues, min A and multiple cues for proper steps and to assist moving L LE due to scissoring (06/08/20)    Time 3    Period  Weeks    Status Revised      PT SHORT TERM GOAL #3   Title Patient will demo ability to stand for at least 5 minutes without UE support to demo improved tolerance for standing to allow for completion of ADL's    Baseline Supervision for 1-2 minutes, RUE Support, able to stand for 5 min performing dynamic activities but require min to mod A due to L leg buckling    Time 4    Period Weeks    Status Partially Met    Target Date 06/08/20      PT SHORT TERM GOAL #4   Title Patient will demo ability to ambulate 28 ft w/ LRAD and Min A to demo improved functional mobility    Baseline 14 ft, Min A    Time 4    Period Weeks    Status Revised    Target Date 06/08/20             PT Long Term Goals - 05/23/20 1750      PT LONG TERM GOAL #1   Title Patient will demo ability to complete  transfers with LRAD and supervision for improved functional mobility (ALL LTG: 07/11/20)    Baseline Min A    Time 8    Period Weeks    Status Revised    Target Date 07/11/20      PT LONG TERM GOAL #2   Title Patient will demo ability to ambulate >150 ft with LRAD and CGA to demo improved functional mobility and ambulation within the home    Baseline 14 ft, Min A    Time 8    Period Weeks    Status Revised      PT LONG TERM GOAL #3   Title Patient will demo ability to complete TUG <45 seconds to demo improved ambulation and mobility    Baseline no baseline established    Time 8    Period Weeks    Status Revised      PT LONG TERM GOAL #4   Title Patient and caregiver/family will be educated on fall prevention within the home to reduce fall risk    Baseline dependent    Time 8    Period Weeks    Status New                 Plan - 06/13/20 1532    Clinical Impression Statement Pt demo increased hip control with gait and exercises today. Pt demonstrated decreased scissoring of her gait during walking. She demo improved endurance with walking today with quad cane.    Personal Factors and Comorbidities Comorbidity 3+;Past/Current Experience;Time since onset of injury/illness/exacerbation;Transportation;Behavior Pattern    Comorbidities DM, HTN, hx of multiple strokes in last 6 months    Examination-Activity Limitations Bathing;Bed Mobility;Caring for Others;Carry;Dressing;Hygiene/Grooming;Lift;Reach Overhead;Squat    Examination-Participation Restrictions Cleaning;Community Activity;Driving;Laundry;Yard Work;Occupation;Church;Meal Prep    Stability/Clinical Decision Making Evolving/Moderate complexity    Rehab Potential Good    PT Frequency 2x / week    PT Duration 8 weeks    PT Treatment/Interventions ADLs/Self Care Home Management;Gait training;Stair training;Functional mobility training;Therapeutic activities;Therapeutic exercise;Balance training;Neuromuscular  re-education;Manual techniques;Orthotic Fit/Training;Patient/family education;Cognitive remediation;Passive range of motion;Energy conservation;DME Instruction;Electrical Stimulation    PT Next Visit Plan Continue to work on transfers. Activites to promote quad actvitiation. Continue gait w/ Swedish Knee Cage in // bars.    PT Home Exercise Plan seated hip march, bridge, transfer training (bed, commode), PROM L ankle?    Consulted  and Agree with Plan of Care Patient;Family member/caregiver    Family Member Consulted Aunt           Patient will benefit from skilled therapeutic intervention in order to improve the following deficits and impairments:  Abnormal gait, Decreased activity tolerance, Decreased balance, Decreased cognition, Decreased coordination, Decreased endurance, Decreased knowledge of precautions, Decreased mobility, Decreased range of motion, Decreased safety awareness, Decreased strength, Difficulty walking, Impaired perceived functional ability, Impaired flexibility, Impaired sensation, Impaired tone, Impaired UE functional use, Postural dysfunction, Pain  Visit Diagnosis: Other abnormalities of gait and mobility  Unsteadiness on feet  Stiffness of left ankle, not elsewhere classified  Hemiplegia and hemiparesis following cerebral infarction affecting left non-dominant side (HCC)  Repeated falls     Problem List Patient Active Problem List   Diagnosis Date Noted  . Uncontrolled type 2 diabetes mellitus with hyperglycemia, without long-term current use of insulin ([place]) 04/16/2020  . CVA (cerebral vascular accident) ([place]) 04/15/2020  . Cocaine use 04/15/2020  . Acute CVA (cerebrovascular accident) ([place]) 03/04/2020  . Stroke (cerebrum) ([place]) 03/03/2020  . Weakness   . Noncompliance with medication regimen   . TIA (transient ischemic attack) 01/22/2019  . Diabetes mellitus due to underlying condition without complications ([place]) 19/16/6060  . Essential hypertension,  benign 05/30/2014  . Smoking 05/30/2014    Kerrie Pleasure, PT 06/13/2020, 3:34 PM  [facility] [address] [place], [place], [address] Phone: [phone]   Fax:  [phone]  Name: Ebony Matthews MRN: [id number] Date of Birth: 1981-02-16

## 2020-06-14 ENCOUNTER — Ambulatory Visit: Payer: Commercial Managed Care - PPO

## 2020-06-15 ENCOUNTER — Other Ambulatory Visit: Payer: Self-pay

## 2020-06-15 ENCOUNTER — Ambulatory Visit: Payer: Commercial Managed Care - PPO

## 2020-06-15 DIAGNOSIS — R2681 Unsteadiness on feet: Secondary | ICD-10-CM

## 2020-06-15 DIAGNOSIS — R296 Repeated falls: Secondary | ICD-10-CM

## 2020-06-15 DIAGNOSIS — M25672 Stiffness of left ankle, not elsewhere classified: Secondary | ICD-10-CM

## 2020-06-15 DIAGNOSIS — R2689 Other abnormalities of gait and mobility: Secondary | ICD-10-CM

## 2020-06-15 DIAGNOSIS — I69354 Hemiplegia and hemiparesis following cerebral infarction affecting left non-dominant side: Secondary | ICD-10-CM

## 2020-06-15 NOTE — Therapy (Signed)
[facility] [place] [address] [place], [place], [address] Phone: [phone]   Fax:  [phone]  Physical Therapy Treatment  Patient Details  Name: Ebony Matthews MRN: [id number] Date of Birth: Feb 03, 1981 Referring Provider (PT): Referred by: Jordan Hawks, MD. Followed by Metta Clines, MD   Encounter Date: 06/15/2020   PT End of Session - 06/15/20 1105    Visit Number 10    Number of Visits 16    Date for PT Re-Evaluation 08/21/20    Authorization Type UHC    PT Start Time 8588    PT Stop Time 1145    PT Time Calculation (min) 43 min    Equipment Utilized During Treatment Gait belt;Other (comment)    Activity Tolerance Patient tolerated treatment well    Behavior During Therapy [facility] for tasks assessed/performed;Flat affect           Past Medical History:  Diagnosis Date  . Diabetes mellitus without complication ([place])   . Hypertension   . TIA (transient ischemic attack) 01/22/2019    Past Surgical History:  Procedure Laterality Date  . NO PAST SURGERIES      There were no vitals filed for this visit.   Subjective Assessment - 06/15/20 1105    Subjective Patient reports no new changes/complaints since last visit. No falls to report. No pain in the ankle.    Patient is accompained by: Family member    Pertinent History hx of multiple CVA, DM, HTN, drug abuse    Limitations Standing;Walking;House hold activities;Lifting    Currently in Pain? No/denies                             [facility] Adult PT Treatment/Exercise - 06/15/20 0001      Transfers   Transfers Sit to Stand;Stand to Sit;Stand Pivot Transfers    Sit to Stand 4: Min assist    Sit to Stand Details Tactile cues for initiation;Tactile cues for weight shifting;Tactile cues for placement;Visual cues/gestures for sequencing;Verbal cues for safe use of DME/AE    Sit to Stand Details (indicate cue type and reason) from w/c, utilizing hemi  walker initially and then worked on completing without device    Stand to Sit 4: Min assist    Stand to Sit Details (indicate cue type and reason) Verbal cues for technique;Verbal cues for precautions/safety    Stand to Sit Details continue to demo decreased control with descent    Stand Pivot Transfers 4: Min guard;4: Min assist    Comments completed task specific transfer <> training from w/c to mat initailly with hemiwalker x 3 reps, increased difficutly with management of hemiwalker noted. Completed transfer training with RW with hand grip attachment. Max verbal cueing requiring for proper sequencing of transfer.       Ambulation/Gait   Ambulation/Gait Yes    Ambulation/Gait Assistance 4: Min assist    Ambulation/Gait Assistance Details completed ambulation with RW with hand grip attachment as patient reports she would like to trial again to determine if she likes hemiwalker vs. RW. Completed ambulation x 115 ft. PT providing Min A for placement of LLE at times as well as improved negotiation as patient often demo decreased awareness. Patient able to stand and maintain balance to place LUE in hand grip attachment and attach strap. Patient demo maintaining L knee extension with ambulation to avoid recurvatum.     Ambulation Distance (Feet) 115 Feet    Assistive  device Rolling walker   w/ L Hand Grip Attachmment   Gait Pattern Step-to pattern;Decreased arm swing - left;Decreased arm swing - right;Decreased step length - left;Decreased stance time - right;Decreased hip/knee flexion - right;Decreased hip/knee flexion - left;Decreased dorsiflexion - left;Decreased weight shift to left    Ambulation Surface Level;Indoor      Exercises   Exercises Other Exercises    Other Exercises  PT completed passive ROM to ankle to promote improved DF/PF as well as inversion/eversion. Completed x 6 minutes. Increased stiffness noted at end range, but no pain with completion. Attempted to compelte AROM with LLE  including DF/PF but patient unable to elicit muscle contraction.                     PT Short Term Goals - 06/08/20 1134      PT SHORT TERM GOAL #1   Title Patient will report independence with HEP with caregiver asssitance (All STG: 06/13/20)    Baseline HEP established    Time 4    Period Weeks    Status Revised    Target Date 06/13/20      PT SHORT TERM GOAL #2   Title Pt will be be able to demonstrate ability to complete transfers from w/c to mat w/ LRAD with CGA to demo improved functional mobility    Baseline Requires min A and multiple verbal and tactile cues, min A and multiple cues for proper steps and to assist moving L LE due to scissoring (06/08/20)    Time 3    Period Weeks    Status Revised      PT SHORT TERM GOAL #3   Title Patient will demo ability to stand for at least 5 minutes without UE support to demo improved tolerance for standing to allow for completion of ADL's    Baseline Supervision for 1-2 minutes, RUE Support, able to stand for 5 min performing dynamic activities but require min to mod A due to L leg buckling    Time 4    Period Weeks    Status Partially Met    Target Date 06/08/20      PT SHORT TERM GOAL #4   Title Patient will demo ability to ambulate 75 ft w/ LRAD and Min A to demo improved functional mobility    Baseline 14 ft, Min A    Time 4    Period Weeks    Status Revised    Target Date 06/08/20             PT Long Term Goals - 05/23/20 1750      PT LONG TERM GOAL #1   Title Patient will demo ability to complete transfers with LRAD and supervision for improved functional mobility (ALL LTG: 07/11/20)    Baseline Min A    Time 8    Period Weeks    Status Revised    Target Date 07/11/20      PT LONG TERM GOAL #2   Title Patient will demo ability to ambulate >150 ft with LRAD and CGA to demo improved functional mobility and ambulation within the home    Baseline 14 ft, Min A    Time 8    Period Weeks    Status Revised       PT LONG TERM GOAL #3   Title Patient will demo ability to complete TUG <45 seconds to demo improved ambulation and mobility    Baseline no baseline established  Time 8    Period Weeks    Status Revised      PT LONG TERM GOAL #4   Title Patient and caregiver/family will be educated on fall prevention within the home to reduce fall risk    Baseline dependent    Time 8    Period Weeks    Status New                 Plan - 06/15/20 1246    Clinical Impression Statement Today's skilled PT session included continued transfer training to promote reduced caregiver assitance with AD. Max verbal cueing still required at htis time. Completed gait trianing with RW with hand grip attachment to determine AD that patient prefers to utilize. Patient verbalizing use of hemi-walker at this time. Will continue to progress toward goals.    Personal Factors and Comorbidities Comorbidity 3+;Past/Current Experience;Time since onset of injury/illness/exacerbation;Transportation;Behavior Pattern    Comorbidities DM, HTN, hx of multiple strokes in last 6 months    Examination-Activity Limitations Bathing;Bed Mobility;Caring for Others;Carry;Dressing;Hygiene/Grooming;Lift;Reach Overhead;Squat    Examination-Participation Restrictions Cleaning;Community Activity;Driving;Laundry;Yard Work;Occupation;Church;Meal Prep    Stability/Clinical Decision Making Evolving/Moderate complexity    Rehab Potential Good    PT Frequency 2x / week    PT Duration 8 weeks    PT Treatment/Interventions ADLs/Self Care Home Management;Gait training;Stair training;Functional mobility training;Therapeutic activities;Therapeutic exercise;Balance training;Neuromuscular re-education;Manual techniques;Orthotic Fit/Training;Patient/family education;Cognitive remediation;Passive range of motion;Energy conservation;DME Instruction;Electrical Stimulation    PT Next Visit Plan Begin process of getting patient hemiwalker. Continue to  work on transfers. Activites to promote quad actvitiation. Continue gait w/ Swedish Knee Cage in // bars.    PT Home Exercise Plan seated hip march, bridge, transfer training (bed, commode), PROM L ankle?    Consulted and Agree with Plan of Care Patient;Family member/caregiver    Family Member Consulted Aunt           Patient will benefit from skilled therapeutic intervention in order to improve the following deficits and impairments:  Abnormal gait, Decreased activity tolerance, Decreased balance, Decreased cognition, Decreased coordination, Decreased endurance, Decreased knowledge of precautions, Decreased mobility, Decreased range of motion, Decreased safety awareness, Decreased strength, Difficulty walking, Impaired perceived functional ability, Impaired flexibility, Impaired sensation, Impaired tone, Impaired UE functional use, Postural dysfunction, Pain  Visit Diagnosis: Other abnormalities of gait and mobility  Unsteadiness on feet  Stiffness of left ankle, not elsewhere classified  Hemiplegia and hemiparesis following cerebral infarction affecting left non-dominant side (HCC)  Repeated falls     Problem List Patient Active Problem List   Diagnosis Date Noted  . Uncontrolled type 2 diabetes mellitus with hyperglycemia, without long-term current use of insulin ([place]) 04/16/2020  . CVA (cerebral vascular accident) ([place]) 04/15/2020  . Cocaine use 04/15/2020  . Acute CVA (cerebrovascular accident) ([place]) 03/04/2020  . Stroke (cerebrum) ([place]) 03/03/2020  . Weakness   . Noncompliance with medication regimen   . TIA (transient ischemic attack) 01/22/2019  . Diabetes mellitus due to underlying condition without complications ([place]) 72/53/6644  . Essential hypertension, benign 05/30/2014  . Smoking 05/30/2014    Jones Bales, PT, DPT 06/15/2020, 12:48 PM  [place] [address] [place], [place],  [address] Phone: [phone]   Fax:  [phone]  Name: Ebony [facility] MRN: [id number] Date of Birth: 1981/04/19

## 2020-06-18 ENCOUNTER — Other Ambulatory Visit: Payer: Self-pay

## 2020-06-18 ENCOUNTER — Ambulatory Visit: Payer: Commercial Managed Care - PPO

## 2020-06-18 ENCOUNTER — Encounter: Payer: Self-pay | Admitting: Occupational Therapy

## 2020-06-18 ENCOUNTER — Ambulatory Visit: Payer: Commercial Managed Care - PPO | Admitting: Occupational Therapy

## 2020-06-18 DIAGNOSIS — I69354 Hemiplegia and hemiparesis following cerebral infarction affecting left non-dominant side: Secondary | ICD-10-CM | POA: Diagnosis not present

## 2020-06-18 DIAGNOSIS — R296 Repeated falls: Secondary | ICD-10-CM

## 2020-06-18 DIAGNOSIS — R2689 Other abnormalities of gait and mobility: Secondary | ICD-10-CM

## 2020-06-18 DIAGNOSIS — R2681 Unsteadiness on feet: Secondary | ICD-10-CM

## 2020-06-18 DIAGNOSIS — R278 Other lack of coordination: Secondary | ICD-10-CM

## 2020-06-18 DIAGNOSIS — M25512 Pain in left shoulder: Secondary | ICD-10-CM

## 2020-06-18 DIAGNOSIS — I69854 Hemiplegia and hemiparesis following other cerebrovascular disease affecting left non-dominant side: Secondary | ICD-10-CM

## 2020-06-18 DIAGNOSIS — R41840 Attention and concentration deficit: Secondary | ICD-10-CM

## 2020-06-18 DIAGNOSIS — M6281 Muscle weakness (generalized): Secondary | ICD-10-CM

## 2020-06-18 DIAGNOSIS — M25612 Stiffness of left shoulder, not elsewhere classified: Secondary | ICD-10-CM

## 2020-06-18 DIAGNOSIS — M25672 Stiffness of left ankle, not elsewhere classified: Secondary | ICD-10-CM

## 2020-06-18 NOTE — Therapy (Signed)
[facility] [address] [place], [place], [address] Phone: [phone]   Fax:  [phone]  Occupational Therapy Evaluation  Patient Details  Name: Ebony EpleyRuby Matthews MRN: [id number] Date of Birth: [birth year] Referring Provider (OT): Dr. Everlena CooperJaffe   Encounter Date: 06/18/2020   OT End of Session - 06/18/20 1020    Visit Number 1    Number of Visits 25    Date for OT Re-Evaluation 09/10/20    Authorization Type UHC Medicaid    Authorization Time Period VL:MN - auth required after 25th visit    Authorization - Visit Number 1    Authorization - Number of Visits 10    Progress Note Due on Visit 10    OT Start Time 1018    OT Stop Time 1100    OT Time Calculation (min) 42 min    Equipment Utilized During Treatment wheelchair    Activity Tolerance Patient tolerated treatment well    Behavior [facility] for tasks assessed/performed;Flat affect           Past Medical History:  Diagnosis Date  . Diabetes mellitus without complication (HCC)   . Hypertension   . TIA (transient ischemic attack) 01/22/2019    Past Surgical History:  Procedure Laterality Date  . NO PAST SURGERIES      There were no vitals filed for this visit.   Subjective Assessment - 06/18/20 1019    Subjective  Pt denies any pain today but reports pain in LUE during transfers and standing. Unclear exactly what aggrivates pain in LUE shoulder. Pt reports no deficits from stroke in 2020 but all left sided weaknes and deficits from stroke June 2021. PMH of CVA 2020, CVA June 2021 (R cerebellar) HTN, IIDM, HLD, on going cocaine/polysubstance abuse, non-compliant with medications and presented with new onset of left arm weakness and numbness.    Patient is accompanied by: Family member    Pertinent History PMH of CVA 2020, CVA June 2021 (R cerebellar) HTN, IIDM, HLD, on going cocaine/polysubstance abuse, non-compliant with medications and presented  with new onset of left arm weakness and numbness.    Limitations Fall Risk. Not Driving. Left Inattention    Patient Stated Goals "to be able to move my arms" and "to be able to hold on and grip on stuff" with LUE    Currently in Pain? No/denies   Pt denies pain today. Some pain noted (3/10) with standing and transfers in LUE            [facility] - 06/18/20 1020      Assessment   Medical Diagnosis CVA with ankle fracture L    Referring Provider (OT) Dr. Everlena CooperJaffe    Hand Dominance Right    Prior Therapy OT evaluation acute care      Precautions   Precautions Fall;Other (comment)    Precaution Comments No driving      Restrictions   Weight Bearing Restrictions Yes    LLE Weight Bearing Weight bearing as tolerated   "limit weight bearing"     Balance Screen   Has the patient fallen in the past 6 months No      Home  Environment   Family/patient expects to be discharged to: Private residence    Living Arrangements Other relatives    Available Help at Discharge Family    Type of Home Aartment    Home Access Other (Comment)    Home Layout Two level  Bathroom Shower/Tub Financial trader No    How accessible Other (Comment)   patient currently not accessing bathroom with shower/tub   Home Equipment Cane -quad;Bedside commode;Wheelchair - manual    Lives With [employer]     Prior Function   Level of Independence Independent    Vocation Unemployed    Vocation Requirements has applied for disability    Leisure game room sweepstakes,       ADL   Eating/Feeding Set up    Grooming Modified independent    Upper Body Bathing + 1 Total asssestance    Lower Body Bathing + 1 Total assistance    Upper Body Dressing Maximal assistance    Lower Body Dressing Maximal assistance    Estate manager/land agent  - Clothing Manipulation Moderate assistance;Other (comment)   patient reports not wearing any pants iwth fasteners   Toileting -  Hygiene Maximal assistance    Tub/Shower Transfer Other (comment)   currently not accessing shower     IADL   Shopping Assistance for transportation;Completely unable to shop    Light Housekeeping All laundry must be done by others;Needs help with all home maintenance tasks;Does not participate in any housekeeping tasks    Meal Prep Does not utilize stove or oven;Needs to have meals prepared and served    [employer] on family or friends for transportation    Medication Management Is not capable of dispensing or managing own medication    Financial Management Requires assistance;Dependent      Mobility   Mobility Status Needs assist      Written Expression   Dominant Hand Right      Vision - History   Baseline Vision Wears glasses all the time      Vision Assessment   Saccades Within functional limits    Convergence Within functional limits    Visual Fields No apparent deficits   left inattention. possible field cut but mostly inattention     Cognition   Overall Cognitive Status Cognition to be further assessed in functional context PRN    Cognition Comments will continue to assess in functional setting      Posture/Postural Control   Posture/Postural Control Postural limitations    Posture Comments Lateral lean to right   Lateral lean to right     Sensation   Light Touch Appears Intact    Proprioception Appears Intact    Additional Comments No report of deficits      Coordination   Other Pt does not have activation of LUE at evaluation.      Tone   Assessment Location Left Upper Extremity      ROM / Strength   AROM / PROM / Strength PROM      AROM   Overall AROM Comments RUE WFL      PROM   Overall PROM  Deficits;Other (comment)    Overall PROM Comments LUE Shoulder ROM 0-95* , all other WFL passive only      Strength     Overall Strength Comments RUE WFL; LUE 0/5 MMT      LUE Tone   LUE Tone Hypotonic;Modified Ashworth      LUE Tone   Hypotonic Details Flaccid LUE    Modified Ashworth Scale for Grading Hypertonia LUE More marked increase  in muscle tone through most of the ROM, but affected part(s) easily moved   Elbow extension                          OT Education - 06/18/20 1117    Education Details Education provided on role and purpose of OT    Person(s) Educated Patient    Methods Explanation    Comprehension Verbalized understanding;Need further instruction            OT Short Term Goals - 06/18/20 1223      OT SHORT TERM GOAL #1   Title Pt will be independent with HEP 07/16/2020    Baseline HEP not issued yet    Time 4    Period Weeks    Status New    Target Date 07/16/20      OT SHORT TERM GOAL #2   Title Pt will perform UB dressing with min A and implementing hemitechniques and adapted strategies PRN in order to increase independence with ADLs    Baseline max A for UB dressing    Time 4    Period Weeks    Status New      OT SHORT TERM GOAL #3   Title Pt will perform bathing with mod A and implementing adapted strategies PRN in order to increase independence with ADLs    Baseline total A currently for all bathing    Time 4    Period Weeks    Status New      OT SHORT TERM GOAL #4   Title Patient will demonstrate safe positioning and handling for LUE during transfers with min verbal cues 4/5 trials in order to decrease pain and risk of injury.    Baseline currently severe left inattention to LUE    Time 4    Period Weeks    Status New      OT SHORT TERM GOAL #5   Title Pt will complete table top scanning with 50 % accuracy in order to increase awareness to left hemisphere.    Baseline 29/121 or approx 24%    Time 4    Period Weeks    Status New      OT SHORT TERM GOAL #6   Title Patient will prepare a light snack with mod A and adapted strategies  PRN in order to increase independence with IADLs.    Baseline total A for snack prep    Time 4    Period Weeks    Status New             OT Long Term Goals - 06/18/20 1545      OT LONG TERM GOAL #1   Title Pt will be independent with updated HEP 08/13/20    Baseline not implemented .    Time 12    Period Weeks    Status New    Target Date 08/13/20      OT LONG TERM GOAL #2   Title Pt will perform UB dressing with supervision and implementing hemitechniques and adapted strategies PRN in order to increase independence with ADLs    Baseline max A for UB dressing    Time 12    Period Weeks    Status New      OT LONG TERM GOAL #3   Title Pt will perform bathing with min A and implementing adapted strategies PRN in order to increase independence with ADLs    Baseline  total A for bathing at evaluation    Time 12    Period Weeks    Status New      OT LONG TERM GOAL #4   Title Patient will demonstrate safe positioning and handling for LUE during transfers Independently (with no verbal cues) 4/5 trials in order to decrease pain and risk of injury.    Baseline currently left inattention to LUE    Time 12    Period Weeks    Status New      OT LONG TERM GOAL #5   Title Pt will complete table top scanning with 75% accuracy in order to increase awareness to left hemisphere.    Baseline 24% accuracy -29/121 double digit cancellation    Time 12    Period Weeks    Status New      OT LONG TERM GOAL #6   Title Patient will prepare a light snack with min A and adapted strategies PRN in order to increase independence with IADLs.    Baseline not preparing snacks    Time 12    Period Weeks    Status New      OT LONG TERM GOAL #7   Title Pt will utilize LUE as a stabilizer with no more than 2 verbal cues in order to increase independence and use of LUE.    Time 12    Period Weeks    Status New                 Plan - 06/18/20 1111    Clinical Impression Statement Patient  is a 39 year old female who came ot ED on 04/15/20 with LUE weakness, sensation changes and slurred speech. MRI + for multiple acute infarcts R cerebral hemisphere and R pons. PMH of CVA 2020, CVA June 2021 (R cerebellar) HTN, IIDM, HLD, on going cocaine/polysubstance abuse, non-compliant with medications and presented with new onset of left arm weakness and numbness. PLOF difficult to get clear picture per patient and family. Patient present with deficits in the areas of LUE tone, strength and range of motion and left inattention impeding ability to complete ADLs and IADLs with maximal independence. Skilled occupational therapy is recommended to target these deficits in order to maximize independence.    OT Occupational Profile and History Detailed Assessment- Review of Records and additional review of physical, cognitive, psychosocial history related to current functional performance    Occupational performance deficits (Please refer to evaluation for details): IADL's;ADL's;Rest and Sleep;Education    Body Structure / Function / Physical Skills ADL;Strength;Gait;Decreased knowledge of use of DME;Balance;Pain;UE functional use;GMC;Dexterity;Endurance;IADL;ROM;Vision;Coordination;Flexibility;Mobility;Sensation;FMC;Decreased knowledge of precautions    Cognitive Skills Attention;Safety Awareness;Problem Solve;Understand;Sequencing    Rehab Potential Good    Clinical Decision Making Several treatment options, min-mod task modification necessary    Comorbidities Affecting Occupational Performance: May have comorbidities impacting occupational performance    Modification or Assistance to Complete Evaluation  Min-Moderate modification of tasks or assist with assess necessary to complete eval    OT Frequency 2x / week    OT Duration 12 weeks    OT Treatment/Interventions Self-care/ADL training;Moist Heat;DME and/or AE instruction;Splinting;Balance training;Fluidtherapy;Gait Training;Therapeutic  activities;Therapeutic exercise;Ultrasound;Electrical Stimulation;Energy conservation;Manual Therapy;Patient/family education;Visual/perceptual remediation/compensation;Passive range of motion;Functional Mobility Training;Neuromuscular education    Plan self passive ROM table exercises for LUE, care and attention training for LUE for positioning    Consulted and Agree with Plan of Care Patient;Family member/caregiver    Family Member Consulted Aunt  Patient will benefit from skilled therapeutic intervention in order to improve the following deficits and impairments:   Body Structure / Function / Physical Skills: ADL, Strength, Gait, Decreased knowledge of use of DME, Balance, Pain, UE functional use, GMC, Dexterity, Endurance, IADL, ROM, Vision, Coordination, Flexibility, Mobility, Sensation, FMC, Decreased knowledge of precautions Cognitive Skills: Attention, Safety Awareness, Problem Solve, Understand, Sequencing     Visit Diagnosis: Other lack of coordination - Plan: Ot plan of care cert/re-cert  Hemiplegia and hemiparesis following other cerebrovascular disease affecting left non-dominant side (HCC) - Plan: Ot plan of care cert/re-cert  Muscle weakness (generalized) - Plan: Ot plan of care cert/re-cert  Attention and concentration deficit - Plan: Ot plan of care cert/re-cert  Stiffness of left shoulder, not elsewhere classified - Plan: Ot plan of care cert/re-cert  Acute pain of left shoulder - Plan: Ot plan of care cert/re-cert    Problem List Patient Active Problem List   Diagnosis Date Noted  . Uncontrolled type 2 diabetes mellitus with hyperglycemia, without long-term current use of insulin (HCC) 04/16/2020  . CVA (cerebral vascular accident) (HCC) 04/15/2020  . Cocaine use 04/15/2020  . Acute CVA (cerebrovascular accident) (HCC) 03/04/2020  . Stroke (cerebrum) (HCC) 03/03/2020  . Weakness   . Noncompliance with medication regimen   . TIA (transient ischemic  attack) 01/22/2019  . Diabetes mellitus due to underlying condition without complications (HCC) 05/30/2014  . Essential hypertension, benign 05/30/2014  . Smoking 05/30/2014    Junious Dresser MOT, OTR/L  06/19/2020, 11:48 AM  [facility] [address] [place], [place], [address] Phone: [phone]   Fax:  [phone]  Name: Ebony [facility] MRN: [id number] Date of Birth: 11-18-1980

## 2020-06-18 NOTE — Therapy (Signed)
[facility] [address] [place], [place], [address] Phone: [phone]   Fax:  [phone]  Physical Therapy Treatment  Patient Details  Name: Sivan Cuello MRN: [id number] Date of Birth: [birth year] Referring Provider (PT): Referred by: Jordan Hawks, MD. Followed by Metta Clines, MD   Encounter Date: 06/18/2020   PT End of Session - 06/18/20 0946    Visit Number 11    Number of Visits 16    Date for PT Re-Evaluation 08/21/20    Authorization Type U[employer]    PT Start Time 0930    PT Stop Time 1015    PT Time Calculation (min) 45 min    Equipment Utilized During Treatment Gait belt;Other (comment)    Activity Tolerance Patient tolerated treatment well    Behavior During Therapy [facility] for tasks assessed/performed;Flat affect           Past Medical History:  Diagnosis Date  . Diabetes mellitus without complication ([place])   . Hypertension   . TIA (transient ischemic attack) 01/22/2019    Past Surgical History:  Procedure Laterality Date  . NO PAST SURGERIES      There were no vitals filed for this visit.   Subjective Assessment - 06/18/20 0942    Subjective no new changes or falls. Pt prefers hemi walker>RW at this time.    Patient is accompained by: Family member    Pertinent History hx of multiple CVA, DM, HTN, drug abuse    Limitations Standing;Walking;House hold activities;Lifting               Nustep: with blue band around thighs to keep legs adducted to neutral: 10' bil LE and one UE (with walking boot on)  Gait training:  1 x 160 feet with hemi walker on R, CGA to min A required, slider put on top of the walking boot to allow foot to slide. Pt is able to pick up L foot with hip flexion but occaisonally she cannot and slider helps to advance L foot.  Pt needs occaisonal help to turn and place walker correctly so she won't hit it with R LE.    Stair training: 4 steps with 1 rail and min A to  control L LE during descent to prevent excessive adduction:                     PT Short Term Goals - 06/18/20 1224      PT SHORT TERM GOAL #1   Title Patient will report independence with HEP with caregiver asssitance (All STG: 06/13/20)    Baseline HEP established    Time 4    Period Weeks    Status Revised    Target Date 06/13/20      PT SHORT TERM GOAL #2   Title Pt will be be able to demonstrate ability to complete transfers from w/c to mat w/ LRAD with CGA to demo improved functional mobility    Baseline Requires min A and multiple verbal and tactile cues, min A and multiple cues for proper steps and to assist moving L LE due to scissoring (06/08/20), min A with hemi walker (pt doesn not have hemi walker at home yet)    Time 3    Period Weeks    Status Revised      PT SHORT TERM GOAL #3   Title Patient will demo ability to stand for at least 5 minutes without UE support to demo improved tolerance for  standing to allow for completion of ADL's    Baseline Supervision for 1-2 minutes, RUE Support, able to stand for 5 min performing dynamic activities but require min to mod A due to L leg buckling; was able to walk 160' which took about 10-15'    Time 4    Period Weeks    Status Achieved    Target Date 06/08/20      PT SHORT TERM GOAL #4   Title Patient will demo ability to ambulate 68 ft w/ LRAD and Min A to demo improved functional mobility    Baseline 14 ft, Min A; 160 feet with heim walker, CGA to min A    Time 4    Period Weeks    Status Achieved    Target Date 06/08/20             PT Long Term Goals - 05/23/20 1750      PT LONG TERM GOAL #1   Title Patient will demo ability to complete transfers with LRAD and supervision for improved functional mobility (ALL LTG: 07/11/20)    Baseline Min A    Time 8    Period Weeks    Status Revised    Target Date 07/11/20      PT LONG TERM GOAL #2   Title Patient will demo ability to ambulate >150 ft with  LRAD and CGA to demo improved functional mobility and ambulation within the home    Baseline 14 ft, Min A    Time 8    Period Weeks    Status Revised      PT LONG TERM GOAL #3   Title Patient will demo ability to complete TUG <45 seconds to demo improved ambulation and mobility    Baseline no baseline established    Time 8    Period Weeks    Status Revised      PT LONG TERM GOAL #4   Title Patient and caregiver/family will be educated on fall prevention within the home to reduce fall risk    Baseline dependent    Time 8    Period Weeks    Status New                 Plan - 06/18/20 0945    Clinical Impression Statement Today's skilled session was focused on continued progression of gait training and strengthening to improve stability in L LE. Patient is demonstrating improivng proximal hip control in L LE but fatigues quickly with exercises. Proximal hip control is not yet within functional limits. STG #3 and 4 met today.    Personal Factors and Comorbidities Comorbidity 3+;Past/Current Experience;Time since onset of injury/illness/exacerbation;Transportation;Behavior Pattern    Comorbidities DM, HTN, hx of multiple strokes in last 6 months    Examination-Activity Limitations Bathing;Bed Mobility;Caring for Others;Carry;Dressing;Hygiene/Grooming;Lift;Reach Overhead;Squat    Examination-Participation Restrictions Cleaning;Community Activity;Driving;Laundry;Yard Work;Occupation;Church;Meal Prep    Stability/Clinical Decision Making Evolving/Moderate complexity    Rehab Potential Good    PT Frequency 2x / week    PT Duration 8 weeks    PT Treatment/Interventions ADLs/Self Care Home Management;Gait training;Stair training;Functional mobility training;Therapeutic activities;Therapeutic exercise;Balance training;Neuromuscular re-education;Manual techniques;Orthotic Fit/Training;Patient/family education;Cognitive remediation;Passive range of motion;Energy conservation;DME  Instruction;Electrical Stimulation    PT Next Visit Plan Begin process of getting patient hemiwalker. Continue to work on transfers. Activites to promote quad actvitiation. Continue gait w/ Swedish Knee Cage in // bars.    PT Home Exercise Plan seated hip march, bridge, transfer training (bed, commode), PROM L  ankle?    Consulted and Agree with Plan of Care Patient;Family member/caregiver    Family Member Consulted Aunt           Patient will benefit from skilled therapeutic intervention in order to improve the following deficits and impairments:  Abnormal gait, Decreased activity tolerance, Decreased balance, Decreased cognition, Decreased coordination, Decreased endurance, Decreased knowledge of precautions, Decreased mobility, Decreased range of motion, Decreased safety awareness, Decreased strength, Difficulty walking, Impaired perceived functional ability, Impaired flexibility, Impaired sensation, Impaired tone, Impaired UE functional use, Postural dysfunction, Pain  Visit Diagnosis: Other abnormalities of gait and mobility  Unsteadiness on feet  Stiffness of left ankle, not elsewhere classified  Hemiplegia and hemiparesis following cerebral infarction affecting left non-dominant side (HCC)  Repeated falls     Problem List Patient Active Problem List   Diagnosis Date Noted  . Uncontrolled type 2 diabetes mellitus with hyperglycemia, without long-term current use of insulin ([place]) 04/16/2020  . CVA (cerebral vascular accident) ([place]) 04/15/2020  . Cocaine use 04/15/2020  . Acute CVA (cerebrovascular accident) ([place]) 03/04/2020  . Stroke (cerebrum) ([place]) 03/03/2020  . Weakness   . Noncompliance with medication regimen   . TIA (transient ischemic attack) 01/22/2019  . Diabetes mellitus due to underlying condition without complications ([place]) 49/67/5916  . Essential hypertension, benign 05/30/2014  . Smoking 05/30/2014    Kerrie Pleasure 06/18/2020, 12:27 PM  [facility] [address] [place], [place], [address] Phone: [phone]   Fax:  [phone]  Name: Deseri Loss MRN: [id number] Date of Birth: 12-02-1980

## 2020-06-19 ENCOUNTER — Ambulatory Visit: Payer: Commercial Managed Care - PPO

## 2020-06-21 ENCOUNTER — Other Ambulatory Visit: Payer: Self-pay

## 2020-06-21 ENCOUNTER — Ambulatory Visit: Payer: Commercial Managed Care - PPO

## 2020-06-21 DIAGNOSIS — R2681 Unsteadiness on feet: Secondary | ICD-10-CM

## 2020-06-21 DIAGNOSIS — R278 Other lack of coordination: Secondary | ICD-10-CM

## 2020-06-21 DIAGNOSIS — M6281 Muscle weakness (generalized): Secondary | ICD-10-CM

## 2020-06-21 DIAGNOSIS — I69854 Hemiplegia and hemiparesis following other cerebrovascular disease affecting left non-dominant side: Secondary | ICD-10-CM

## 2020-06-21 DIAGNOSIS — R2689 Other abnormalities of gait and mobility: Secondary | ICD-10-CM

## 2020-06-21 DIAGNOSIS — I69354 Hemiplegia and hemiparesis following cerebral infarction affecting left non-dominant side: Secondary | ICD-10-CM | POA: Diagnosis not present

## 2020-06-21 NOTE — Therapy (Signed)
[facility] [address] [place], [place], [address] Phone: [phone]   Fax:  [phone]  Physical Therapy Treatment  Patient Details  Name: Ebony Matthews MRN: [id number] Date of Birth: 25-Sep-1981 Referring Provider (PT): Referred by: Georgiann Cocker, MD. Followed by Shon Millet, MD   Encounter Date: 06/21/2020   PT End of Session - 06/21/20 1200    Visit Number 12    Number of Visits 16    Date for PT Re-Evaluation 08/21/20    Authorization Type UHC    PT Start Time 1156   pt arriving late   PT Stop Time 1230    PT Time Calculation (min) 34 min    Equipment Utilized During Treatment Gait belt;Other (comment)    Activity Tolerance Patient tolerated treatment well    Behavior During Therapy [facility] for tasks assessed/performed;Flat affect           Past Medical History:  Diagnosis Date  . Diabetes mellitus without complication (HCC)   . Hypertension   . TIA (transient ischemic attack) 01/22/2019    Past Surgical History:  Procedure Laterality Date  . NO PAST SURGERIES      There were no vitals filed for this visit.   Subjective Assessment - 06/21/20 1158    Subjective Patient reports no new changes/complaints since last visit. Patient reports had a fall this morning trying to walk into bathroom, patient reports that fell into floor and was able to get up with assistance from Mom. Patient reports no injuries from fall.    Patient is accompained by: Family member    Pertinent History hx of multiple CVA, DM, HTN, drug abuse    Limitations Standing;Walking;House hold activities;Lifting    Currently in Pain? No/denies                             [facility] Adult PT Treatment/Exercise - 06/21/20 0001      Transfers   Transfers Sit to Stand;Stand to Sit;Stand Pivot Transfers    Sit to Stand 4: Min assist    Sit to Stand Details Tactile cues for initiation;Tactile cues for weight shifting;Tactile  cues for placement;Visual cues/gestures for sequencing;Verbal cues for safe use of DME/AE    Stand to Sit 4: Min guard    Stand to Sit Details (indicate cue type and reason) Verbal cues for technique;Verbal cues for precautions/safety    Stand Pivot Transfers 4: Min guard;4: Min assist    Stand Pivot Transfer Details (indicate cue type and reason) completed transfer from w/c <> mat x 2, min A required for placement of LLE      Neuro Re-ed    Neuro Re-ed Details  Supine on mat completed the following exercsies: bridge 2 x 10 reps with verbal cues to hold LLE in neutral position. Completed alteranting trunk rotations, 2 x 15 reps with verbal cues for control of LLE with completion. Completd alternating marching in hooklying position, 2 x 10 reps patient demo improved hip flexion on LLE. Completed bent knee fallouts, added yellow TB to thigh for resistance. Updated HEP to include alternating marching and progressed bent knee fall out to completed with theraband.            Access Code: [email or web address] URL: [email or web address] Date: 06/21/2020 Prepared by: Jethro Bastos  Exercises Supine Heel Slide - 2 x daily - 7 x weekly - 1 sets - 10 reps Small Range Straight Leg Raise - 2 x  daily - 7 x weekly - 1 sets - 10 reps Clamshell - 2 x daily - 7 x weekly - 1 sets - 10 reps Supine March - 1 x daily - 7 x weekly - 2 sets - 10 reps Hooklying Single Leg Bent Knee Fallouts with Resistance - 1 x daily - 7 x weekly - 2 sets - 10 reps         PT Education - 06/21/20 1417    Education Details HEP Update; Safety within home due to recent fall    Person(s) Educated Patient;Other (comment)   Aunt   Methods Explanation;Demonstration;Handout    Comprehension Verbalized understanding;Need further instruction;Returned demonstration            PT Short Term Goals - 06/18/20 1224      PT SHORT TERM GOAL #1   Title Patient will report independence with HEP with caregiver asssitance (All STG:  06/13/20)    Baseline HEP established    Time 4    Period Weeks    Status Revised    Target Date 06/13/20      PT SHORT TERM GOAL #2   Title Pt will be be able to demonstrate ability to complete transfers from w/c to mat w/ LRAD with CGA to demo improved functional mobility    Baseline Requires min A and multiple verbal and tactile cues, min A and multiple cues for proper steps and to assist moving L LE due to scissoring (06/08/20), min A with hemi walker (pt doesn not have hemi walker at home yet)    Time 3    Period Weeks    Status Revised      PT SHORT TERM GOAL #3   Title Patient will demo ability to stand for at least 5 minutes without UE support to demo improved tolerance for standing to allow for completion of ADL's    Baseline Supervision for 1-2 minutes, RUE Support, able to stand for 5 min performing dynamic activities but require min to mod A due to L leg buckling; was able to walk 160' which took about 10-15'    Time 4    Period Weeks    Status Achieved    Target Date 06/08/20      PT SHORT TERM GOAL #4   Title Patient will demo ability to ambulate 75 ft w/ LRAD and Min A to demo improved functional mobility    Baseline 14 ft, Min A; 160 feet with heim walker, CGA to min A    Time 4    Period Weeks    Status Achieved    Target Date 06/08/20             PT Long Term Goals - 05/23/20 1750      PT LONG TERM GOAL #1   Title Patient will demo ability to complete transfers with LRAD and supervision for improved functional mobility (ALL LTG: 07/11/20)    Baseline Min A    Time 8    Period Weeks    Status Revised    Target Date 07/11/20      PT LONG TERM GOAL #2   Title Patient will demo ability to ambulate >150 ft with LRAD and CGA to demo improved functional mobility and ambulation within the home    Baseline 14 ft, Min A    Time 8    Period Weeks    Status Revised      PT LONG TERM GOAL #3   Title Patient will  demo ability to complete TUG <45 seconds to demo  improved ambulation and mobility    Baseline no baseline established    Time 8    Period Weeks    Status Revised      PT LONG TERM GOAL #4   Title Patient and caregiver/family will be educated on fall prevention within the home to reduce fall risk    Baseline dependent    Time 8    Period Weeks    Status New                 Plan - 06/21/20 1426    Clinical Impression Statement Session limited today due to patient arriving late. Completed exercises in supine/hooklying on mat and progressed as tolerated by patient. Patient demo improved functional improvements in hip flexion and abduction strength with exercies, progressed HEP as able. Will continue to progress toward goals.    Personal Factors and Comorbidities Comorbidity 3+;Past/Current Experience;Time since onset of injury/illness/exacerbation;Transportation;Behavior Pattern    Comorbidities DM, HTN, hx of multiple strokes in last 6 months    Examination-Activity Limitations Bathing;Bed Mobility;Caring for Others;Carry;Dressing;Hygiene/Grooming;Lift;Reach Overhead;Squat    Examination-Participation Restrictions Cleaning;Community Activity;Driving;Laundry;Yard Work;Occupation;Church;Meal Prep    Stability/Clinical Decision Making Evolving/Moderate complexity    Rehab Potential Good    PT Frequency 2x / week    PT Duration 8 weeks    PT Treatment/Interventions ADLs/Self Care Home Management;Gait training;Stair training;Functional mobility training;Therapeutic activities;Therapeutic exercise;Balance training;Neuromuscular re-education;Manual techniques;Orthotic Fit/Training;Patient/family education;Cognitive remediation;Passive range of motion;Energy conservation;DME Instruction;Electrical Stimulation    PT Next Visit Plan Did we get hemiwalker?. Continue to work on transfers. Activites to promote quad actvitiation. Continue gait w/ Swedish Knee Cage in // bars.    PT Home Exercise Plan seated hip march, bridge, transfer training  (bed, commode), PROM L ankle?    Consulted and Agree with Plan of Care Patient;Family member/caregiver    Family Member Consulted Aunt           Patient will benefit from skilled therapeutic intervention in order to improve the following deficits and impairments:  Abnormal gait, Decreased activity tolerance, Decreased balance, Decreased cognition, Decreased coordination, Decreased endurance, Decreased knowledge of precautions, Decreased mobility, Decreased range of motion, Decreased safety awareness, Decreased strength, Difficulty walking, Impaired perceived functional ability, Impaired flexibility, Impaired sensation, Impaired tone, Impaired UE functional use, Postural dysfunction, Pain  Visit Diagnosis: Other lack of coordination  Hemiplegia and hemiparesis following other cerebrovascular disease affecting left non-dominant side (HCC)  Muscle weakness (generalized)  Other abnormalities of gait and mobility  Unsteadiness on feet     Problem List Patient Active Problem List   Diagnosis Date Noted  . Uncontrolled type 2 diabetes mellitus with hyperglycemia, without long-term current use of insulin (HCC) 04/16/2020  . CVA (cerebral vascular accident) (HCC) 04/15/2020  . Cocaine use 04/15/2020  . Acute CVA (cerebrovascular accident) (HCC) 03/04/2020  . Stroke (cerebrum) (HCC) 03/03/2020  . Weakness   . Noncompliance with medication regimen   . TIA (transient ischemic attack) 01/22/2019  . Diabetes mellitus due to underlying condition without complications (HCC) 05/30/2014  . Essential hypertension, benign 05/30/2014  . Smoking 05/30/2014    Tempie Donning, PT, DPT 06/21/2020, 2:28 PM  [facility] [address] [place], [place], [address] Phone: [phone]   Fax:  [phone]  Name: Ebony Matthews MRN: [id number] Date of Birth: [birth year]

## 2020-06-21 NOTE — Patient Instructions (Addendum)
Access Code: [email or web address] URL: [email or web address] Date: 06/21/2020 Prepared by: Jethro Bastos  Exercises Supine Heel Slide - 2 x daily - 7 x weekly - 1 sets - 10 reps Small Range Straight Leg Raise - 2 x daily - 7 x weekly - 1 sets - 10 reps Clamshell - 2 x daily - 7 x weekly - 1 sets - 10 reps Supine March - 1 x daily - 7 x weekly - 2 sets - 10 reps Hooklying Single Leg Bent Knee Fallouts with Resistance - 1 x daily - 7 x weekly - 2 sets - 10 reps

## 2020-06-22 ENCOUNTER — Ambulatory Visit: Payer: Commercial Managed Care - PPO

## 2020-06-25 ENCOUNTER — Ambulatory Visit: Payer: Commercial Managed Care - PPO

## 2020-06-25 ENCOUNTER — Other Ambulatory Visit: Payer: Self-pay

## 2020-06-25 DIAGNOSIS — R296 Repeated falls: Secondary | ICD-10-CM

## 2020-06-25 DIAGNOSIS — I69854 Hemiplegia and hemiparesis following other cerebrovascular disease affecting left non-dominant side: Secondary | ICD-10-CM

## 2020-06-25 DIAGNOSIS — I69354 Hemiplegia and hemiparesis following cerebral infarction affecting left non-dominant side: Secondary | ICD-10-CM | POA: Diagnosis not present

## 2020-06-25 DIAGNOSIS — M25672 Stiffness of left ankle, not elsewhere classified: Secondary | ICD-10-CM

## 2020-06-25 DIAGNOSIS — R2689 Other abnormalities of gait and mobility: Secondary | ICD-10-CM

## 2020-06-25 DIAGNOSIS — R2681 Unsteadiness on feet: Secondary | ICD-10-CM

## 2020-06-25 DIAGNOSIS — M6281 Muscle weakness (generalized): Secondary | ICD-10-CM

## 2020-06-25 NOTE — Therapy (Signed)
[facility] [address] [place], [place], [address] Phone: [phone]   Fax:  [phone]  Physical Therapy Treatment  Patient Details  Name: Ebony Matthews MRN: [id number] Date of Birth: 1981/05/03 Referring Provider (PT): Referred by: Georgiann Cocker, MD. Followed by Shon Millet, MD   Encounter Date: 06/25/2020   PT End of Session - 06/25/20 1312    Visit Number 13    Number of Visits 16    Date for PT Re-Evaluation 08/21/20    Authorization Type [employer]    PT Start Time 1100    PT Stop Time 1145    PT Time Calculation (min) 45 min    Equipment Utilized During Treatment Gait belt;Other (comment)    Activity Tolerance Patient tolerated treatment well    Behavior During Therapy [facility] for tasks assessed/performed;Flat affect           Past Medical History:  Diagnosis Date   Diabetes mellitus without complication (HCC)    Hypertension    TIA (transient ischemic attack) 01/22/2019    Past Surgical History:  Procedure Laterality Date   NO PAST SURGERIES      There were no vitals filed for this visit.   Subjective Assessment - 06/25/20 1307    Subjective Doing okay    Patient is accompained by: Family member    Pertinent History hx of multiple CVA, DM, HTN, drug abuse    Limitations Standing;Walking;House hold activities;Lifting             Chair to Mat table: CGA, cues for hand placement for safe transfers with hemi walker Sit to supine: min A Supine: manually resisted , slow reversal with quick stretch for following movements: - ankle dorsiflexion/plantarflexion: no muscle activiation noted - supine hooklying: hip abduction/adduction: improved mm activation - supine quad sets - Seated knee flexion and extension SLR: 2 x 10 SL hip abduction: 2 x 10 SL clamshells: 2 x 10   Gait training: 144 feet with hemi walker, CGA  Stair training: using rail on L, min A, going up and down sideways, verbal  cues for positioning of feet to improve safety, min A to move L LE to avoid stepping on foot and foot clerance                            PT Short Term Goals - 06/18/20 1224      PT SHORT TERM GOAL #1   Title Patient will report independence with HEP with caregiver asssitance (All STG: 06/13/20)    Baseline HEP established    Time 4    Period Weeks    Status Revised    Target Date 06/13/20      PT SHORT TERM GOAL #2   Title Pt will be be able to demonstrate ability to complete transfers from w/c to mat w/ LRAD with CGA to demo improved functional mobility    Baseline Requires min A and multiple verbal and tactile cues, min A and multiple cues for proper steps and to assist moving L LE due to scissoring (06/08/20), min A with hemi walker (pt doesn not have hemi walker at home yet)    Time 3    Period Weeks    Status Revised      PT SHORT TERM GOAL #3   Title Patient will demo ability to stand for at least 5 minutes without UE support to demo improved tolerance for standing to allow  for completion of ADL's    Baseline Supervision for 1-2 minutes, RUE Support, able to stand for 5 min performing dynamic activities but require min to mod A due to L leg buckling; was able to walk 160' which took about 10-15'    Time 4    Period Weeks    Status Achieved    Target Date 06/08/20      PT SHORT TERM GOAL #4   Title Patient will demo ability to ambulate 75 ft w/ LRAD and Min A to demo improved functional mobility    Baseline 14 ft, Min A; 160 feet with heim walker, CGA to min A    Time 4    Period Weeks    Status Achieved    Target Date 06/08/20             PT Long Term Goals - 05/23/20 1750      PT LONG TERM GOAL #1   Title Patient will demo ability to complete transfers with LRAD and supervision for improved functional mobility (ALL LTG: 07/11/20)    Baseline Min A    Time 8    Period Weeks    Status Revised    Target Date 07/11/20      PT LONG TERM GOAL  #2   Title Patient will demo ability to ambulate >150 ft with LRAD and CGA to demo improved functional mobility and ambulation within the home    Baseline 14 ft, Min A    Time 8    Period Weeks    Status Revised      PT LONG TERM GOAL #3   Title Patient will demo ability to complete TUG <45 seconds to demo improved ambulation and mobility    Baseline no baseline established    Time 8    Period Weeks    Status Revised      PT LONG TERM GOAL #4   Title Patient and caregiver/family will be educated on fall prevention within the home to reduce fall risk    Baseline dependent    Time 8    Period Weeks    Status New                 Plan - 06/25/20 1307    Clinical Impression Statement Today's session was focused on improving strength and coordinatino of L LE and improving wlaking endurance. We practiced stairs today, patient required min A with going up the stairs using rail on L. Pt able to go up sideways.    Personal Factors and Comorbidities Comorbidity 3+;Past/Current Experience;Time since onset of injury/illness/exacerbation;Transportation;Behavior Pattern    Comorbidities DM, HTN, hx of multiple strokes in last 6 months    Examination-Activity Limitations Bathing;Bed Mobility;Caring for Others;Carry;Dressing;Hygiene/Grooming;Lift;Reach Overhead;Squat    Examination-Participation Restrictions Cleaning;Community Activity;Driving;Laundry;Yard Work;Occupation;Church;Meal Prep    Stability/Clinical Decision Making Evolving/Moderate complexity    Rehab Potential Good    PT Frequency 2x / week    PT Duration 8 weeks    PT Treatment/Interventions ADLs/Self Care Home Management;Gait training;Stair training;Functional mobility training;Therapeutic activities;Therapeutic exercise;Balance training;Neuromuscular re-education;Manual techniques;Orthotic Fit/Training;Patient/family education;Cognitive remediation;Passive range of motion;Energy conservation;DME Instruction;Electrical  Stimulation    PT Next Visit Plan Did we get hemiwalker?. Continue to work on transfers. Activites to promote quad actvitiation. Continue gait w/ Swedish Knee Cage in // bars.    PT Home Exercise Plan seated hip march, bridge, transfer training (bed, commode), PROM L ankle?    Consulted and Agree with Plan of Care Patient;Family member/caregiver  Family Member Consulted Aunt           Patient will benefit from skilled therapeutic intervention in order to improve the following deficits and impairments:  Abnormal gait, Decreased activity tolerance, Decreased balance, Decreased cognition, Decreased coordination, Decreased endurance, Decreased knowledge of precautions, Decreased mobility, Decreased range of motion, Decreased safety awareness, Decreased strength, Difficulty walking, Impaired perceived functional ability, Impaired flexibility, Impaired sensation, Impaired tone, Impaired UE functional use, Postural dysfunction, Pain  Visit Diagnosis: Hemiplegia and hemiparesis following other cerebrovascular disease affecting left non-dominant side (HCC)  Muscle weakness (generalized)  Other abnormalities of gait and mobility  Unsteadiness on feet  Stiffness of left ankle, not elsewhere classified  Repeated falls     Problem List Patient Active Problem List   Diagnosis Date Noted   Uncontrolled type 2 diabetes mellitus with hyperglycemia, without long-term current use of insulin (HCC) 04/16/2020   CVA (cerebral vascular accident) (HCC) 04/15/2020   Cocaine use 04/15/2020   Acute CVA (cerebrovascular accident) (HCC) 03/04/2020   Stroke (cerebrum) (HCC) 03/03/2020   Weakness    Noncompliance with medication regimen    TIA (transient ischemic attack) 01/22/2019   Diabetes mellitus due to underlying condition without complications (HCC) 05/30/2014   Essential hypertension, benign 05/30/2014   Smoking 05/30/2014    Ileana Ladd, PT 06/25/2020, 1:13 PM  [facility] [address] [place], [place], [address] Phone: [phone]   Fax:  [phone]  Name: Ebony Matthews MRN: [id number] Date of Birth: December 24, 1980

## 2020-06-26 ENCOUNTER — Ambulatory Visit: Payer: Commercial Managed Care - PPO

## 2020-06-27 ENCOUNTER — Ambulatory Visit: Payer: Self-pay

## 2020-06-27 ENCOUNTER — Other Ambulatory Visit: Payer: Self-pay

## 2020-06-27 ENCOUNTER — Ambulatory Visit: Payer: Commercial Managed Care - PPO | Admitting: Family Medicine

## 2020-06-27 ENCOUNTER — Encounter: Payer: Self-pay | Admitting: Family Medicine

## 2020-06-27 DIAGNOSIS — S8265XA Nondisplaced fracture of lateral malleolus of left fibula, initial encounter for closed fracture: Secondary | ICD-10-CM | POA: Diagnosis not present

## 2020-06-27 NOTE — Progress Notes (Signed)
   Office Visit Note   Patient: Ebony Matthews           Date of Birth: [birth year]           MRN: [id number] Visit Date: 06/27/2020 Requested by: Georgiann Cocker, NP [address] [place],  [place] [address] PCP: Georgiann Cocker, NP  Subjective: Chief Complaint  Patient presents with  . Right Ankle - Fracture, Follow-up    Almost 2 mos out from lateral malleolus fx - no pain. Still in boot. Does strengthening exercises with bands, sometimes.    HPI: She is almost 2 months status post left ankle lateral malleolus fracture.  Not having any pain.  She is wearing her fracture boot when weightbearing with physical therapy.  She still has a lot of weakness in her left leg related to her stroke.               ROS:   All other systems were reviewed and are negative.  Objective: Vital Signs: There were no vitals taken for this visit.  Physical Exam:  General:  Alert and oriented, in no acute distress. Pulm:  Breathing unlabored. Psy:  Normal mood, congruent affect.  Left ankle: Limited active range of motion of her ankle.  She is not tender to palpation over the lateral malleolus today.  Imaging: XR Ankle Complete Left  Result Date: 06/27/2020 Left ankle x-rays reveal nearly anatomic alignment of the lateral malleolus fracture with good callus formation present.   Assessment & Plan: 1.  Clinically healing left ankle lateral malleolus fracture. -Okay to switch to an ASO brace.  Continue weightbearing as tolerated.  As long as she remains pain-free from a fracture standpoint, she can follow-up as needed.     Procedures: No procedures performed  No notes on file     PMFS History: Patient Active Problem List   Diagnosis Date Noted  . Uncontrolled type 2 diabetes mellitus with hyperglycemia, without long-term current use of insulin (HCC) 04/16/2020  . CVA (cerebral vascular accident) (HCC) 04/15/2020  . Cocaine use 04/15/2020  . Acute CVA (cerebrovascular accident)  (HCC) 03/04/2020  . Stroke (cerebrum) (HCC) 03/03/2020  . Weakness   . Noncompliance with medication regimen   . TIA (transient ischemic attack) 01/22/2019  . Diabetes mellitus due to underlying condition without complications (HCC) 05/30/2014  . Essential hypertension, benign 05/30/2014  . Smoking 05/30/2014   Past Medical History:  Diagnosis Date  . Diabetes mellitus without complication (HCC)   . Hypertension   . TIA (transient ischemic attack) 01/22/2019    Family History  Problem Relation Age of Onset  . Diabetes Other   . Hypertension Maternal Grandmother     Past Surgical History:  Procedure Laterality Date  . NO PAST SURGERIES     Social History   Occupational History  . Not on file  Tobacco Use  . Smoking status: Current Every Day Smoker    Packs/day: 1.00    Years: 15.00    Pack years: 15.00  . Smokeless tobacco: Never Used  Vaping Use  . Vaping Use: Never used  Substance and Sexual Activity  . Alcohol use: No  . Drug use: Yes    Types: Marijuana    Comment: occasional  . Sexual activity: Yes    Birth control/protection: None

## 2020-06-28 ENCOUNTER — Ambulatory Visit: Payer: Commercial Managed Care - PPO

## 2020-06-28 DIAGNOSIS — M25672 Stiffness of left ankle, not elsewhere classified: Secondary | ICD-10-CM

## 2020-06-28 DIAGNOSIS — I69854 Hemiplegia and hemiparesis following other cerebrovascular disease affecting left non-dominant side: Secondary | ICD-10-CM

## 2020-06-28 DIAGNOSIS — R296 Repeated falls: Secondary | ICD-10-CM

## 2020-06-28 DIAGNOSIS — I69354 Hemiplegia and hemiparesis following cerebral infarction affecting left non-dominant side: Secondary | ICD-10-CM | POA: Diagnosis not present

## 2020-06-28 DIAGNOSIS — R2681 Unsteadiness on feet: Secondary | ICD-10-CM

## 2020-06-28 DIAGNOSIS — M6281 Muscle weakness (generalized): Secondary | ICD-10-CM

## 2020-06-28 DIAGNOSIS — R2689 Other abnormalities of gait and mobility: Secondary | ICD-10-CM

## 2020-06-28 NOTE — Therapy (Signed)
[facility] [address] [place], [place], [address] Phone: [phone]   Fax:  [phone]  Physical Therapy Treatment  Patient Details  Name: Ebony Matthews MRN: [id number] Date of Birth: 03-18-1981 Referring Provider (PT): Referred by: Georgiann Cocker, MD. Followed by Shon Millet, MD   Encounter Date: 06/28/2020   PT End of Session - 06/28/20 1006    Visit Number 14    Number of Visits 16    Date for PT Re-Evaluation 08/21/20    Authorization Type UHC    PT Start Time 0935    PT Stop Time 1015    PT Time Calculation (min) 40 min    Equipment Utilized During Treatment Gait belt;Other (comment)    Activity Tolerance Patient tolerated treatment well    Behavior During Therapy [facility] for tasks assessed/performed;Flat affect           Past Medical History:  Diagnosis Date   Diabetes mellitus without complication (HCC)    Hypertension    TIA (transient ischemic attack) 01/22/2019    Past Surgical History:  Procedure Laterality Date   NO PAST SURGERIES      There were no vitals filed for this visit.   Subjective Assessment - 06/28/20 1006    Subjective walking boot came off. MD said ankle is healing really well. They gave her traditional ankle brace for stability for now. No weight bearing restrictions per patient. Pt brought hemi walker in to be adjusted.    Patient is accompained by: Family member    Pertinent History hx of multiple CVA, DM, HTN, drug abuse    Limitations Standing;Walking;House hold activities;Lifting               Gait training: 1 x 207' with hemi walker and CGA Stairs: 4 steps: rail on L, going up sideways and coming down facing forward, cues to prevent hyperadduction of L leg and leaving room for R foot when coming down the steps, min A for intermittent assistance with L LE                        PT Short Term Goals - 06/28/20 1007      PT SHORT TERM GOAL  #1   Title Patient will report independence with HEP with caregiver asssitance (All STG: 06/13/20)    Baseline HEP established    Time 4    Period Weeks    Status Revised    Target Date 06/13/20      PT SHORT TERM GOAL #2   Title Pt will be be able to demonstrate ability to complete transfers from w/c to mat w/ LRAD with CGA to demo improved functional mobility    Baseline Requires min A and multiple verbal and tactile cues, min A and multiple cues for proper steps and to assist moving L LE due to scissoring (06/08/20), min A with hemi walker (pt doesn not have hemi walker at home yet); CGA with hemi walker 06/28/20    Time 3    Period Weeks    Status Achieved      PT SHORT TERM GOAL #3   Title Patient will demo ability to stand for at least 5 minutes without UE support to demo improved tolerance for standing to allow for completion of ADL's    Baseline Supervision for 1-2 minutes, RUE Support, able to stand for 5 min performing dynamic activities but require min to mod A due to L leg  buckling; was able to walk 160' which took about 10-15'    Time 4    Period Weeks    Status Achieved    Target Date 06/08/20      PT SHORT TERM GOAL #4   Title Patient will demo ability to ambulate 75 ft w/ LRAD and Min A to demo improved functional mobility    Baseline 14 ft, Min A; 160 feet with heim walker, CGA to min A    Time 4    Period Weeks    Status Achieved    Target Date 06/08/20             PT Long Term Goals - 06/28/20 1008      PT LONG TERM GOAL #1   Title Patient will demo ability to complete transfers with LRAD and supervision for improved functional mobility (ALL LTG: 07/11/20)    Baseline Min A    Time 8    Period Weeks    Status Achieved      PT LONG TERM GOAL #2   Title Patient will demo ability to ambulate >150 ft with LRAD and CGA to demo improved functional mobility and ambulation within the home    Baseline 14 ft, Min A; 207' with CGA and hemi walker    Time 8     Period Weeks    Status Achieved      PT LONG TERM GOAL #3   Title Patient will demo ability to complete TUG <45 seconds to demo improved ambulation and mobility    Baseline no baseline established    Time 8    Period Weeks    Status Revised      PT LONG TERM GOAL #4   Title Patient and caregiver/family will be educated on fall prevention within the home to reduce fall risk    Baseline dependent    Time 8    Period Weeks    Status New                 Plan - 06/28/20 1904    Clinical Impression Statement PT demonstrated improved walking endurance to 207' with hemi walker and CGA only today. There was no observation of LOB during the gait. Patient is reporting no pain in L ankle. Pt requires min A with stairs to occaisonally place L foot properly.    Personal Factors and Comorbidities Comorbidity 3+;Past/Current Experience;Time since onset of injury/illness/exacerbation;Transportation;Behavior Pattern    Comorbidities DM, HTN, hx of multiple strokes in last 6 months    Examination-Activity Limitations Bathing;Bed Mobility;Caring for Others;Carry;Dressing;Hygiene/Grooming;Lift;Reach Overhead;Squat    Examination-Participation Restrictions Cleaning;Community Activity;Driving;Laundry;Yard Work;Occupation;Church;Meal Prep    Stability/Clinical Decision Making Evolving/Moderate complexity    Rehab Potential Good    PT Frequency 2x / week    PT Duration 8 weeks    PT Treatment/Interventions ADLs/Self Care Home Management;Gait training;Stair training;Functional mobility training;Therapeutic activities;Therapeutic exercise;Balance training;Neuromuscular re-education;Manual techniques;Orthotic Fit/Training;Patient/family education;Cognitive remediation;Passive range of motion;Energy conservation;DME Instruction;Electrical Stimulation    PT Next Visit Plan Did we get hemiwalker?. Continue to work on transfers. Activites to promote quad actvitiation. Continue gait w/ Swedish Knee Cage in //  bars.    PT Home Exercise Plan seated hip march, bridge, transfer training (bed, commode), PROM L ankle?    Consulted and Agree with Plan of Care Patient;Family member/caregiver    Family Member Consulted Aunt           Patient will benefit from skilled therapeutic intervention in order to improve the following deficits  and impairments:  Abnormal gait, Decreased activity tolerance, Decreased balance, Decreased cognition, Decreased coordination, Decreased endurance, Decreased knowledge of precautions, Decreased mobility, Decreased range of motion, Decreased safety awareness, Decreased strength, Difficulty walking, Impaired perceived functional ability, Impaired flexibility, Impaired sensation, Impaired tone, Impaired UE functional use, Postural dysfunction, Pain  Visit Diagnosis: Hemiplegia and hemiparesis following other cerebrovascular disease affecting left non-dominant side (HCC)  Muscle weakness (generalized)  Other abnormalities of gait and mobility  Unsteadiness on feet  Stiffness of left ankle, not elsewhere classified  Repeated falls     Problem List Patient Active Problem List   Diagnosis Date Noted   Uncontrolled type 2 diabetes mellitus with hyperglycemia, without long-term current use of insulin (HCC) 04/16/2020   CVA (cerebral vascular accident) (HCC) 04/15/2020   Cocaine use 04/15/2020   Acute CVA (cerebrovascular accident) (HCC) 03/04/2020   Stroke (cerebrum) (HCC) 03/03/2020   Weakness    Noncompliance with medication regimen    TIA (transient ischemic attack) 01/22/2019   Diabetes mellitus due to underlying condition without complications (HCC) 05/30/2014   Essential hypertension, benign 05/30/2014   Smoking 05/30/2014    Ileana Ladd, PT 06/28/2020, 7:06 PM  [facility] [address] [place], [place], [address] Phone: [phone]   Fax:  [phone]  Name: Ebony Matthews MRN: [id number] Date of Birth: June 08, 1981

## 2020-06-29 ENCOUNTER — Ambulatory Visit: Payer: Commercial Managed Care - PPO

## 2020-07-02 ENCOUNTER — Other Ambulatory Visit: Payer: Self-pay

## 2020-07-02 ENCOUNTER — Encounter: Payer: Self-pay | Admitting: Physical Therapy

## 2020-07-02 ENCOUNTER — Ambulatory Visit: Payer: Medicaid Other | Attending: Internal Medicine | Admitting: Physical Therapy

## 2020-07-02 DIAGNOSIS — M25672 Stiffness of left ankle, not elsewhere classified: Secondary | ICD-10-CM | POA: Diagnosis present

## 2020-07-02 DIAGNOSIS — R296 Repeated falls: Secondary | ICD-10-CM | POA: Insufficient documentation

## 2020-07-02 DIAGNOSIS — R2681 Unsteadiness on feet: Secondary | ICD-10-CM | POA: Diagnosis present

## 2020-07-02 DIAGNOSIS — M25612 Stiffness of left shoulder, not elsewhere classified: Secondary | ICD-10-CM | POA: Insufficient documentation

## 2020-07-02 DIAGNOSIS — R278 Other lack of coordination: Secondary | ICD-10-CM | POA: Insufficient documentation

## 2020-07-02 DIAGNOSIS — I69854 Hemiplegia and hemiparesis following other cerebrovascular disease affecting left non-dominant side: Secondary | ICD-10-CM | POA: Diagnosis present

## 2020-07-02 DIAGNOSIS — M6281 Muscle weakness (generalized): Secondary | ICD-10-CM | POA: Diagnosis present

## 2020-07-02 DIAGNOSIS — M25512 Pain in left shoulder: Secondary | ICD-10-CM | POA: Insufficient documentation

## 2020-07-02 DIAGNOSIS — R41840 Attention and concentration deficit: Secondary | ICD-10-CM | POA: Diagnosis present

## 2020-07-02 DIAGNOSIS — R2689 Other abnormalities of gait and mobility: Secondary | ICD-10-CM | POA: Insufficient documentation

## 2020-07-02 NOTE — Therapy (Signed)
[facility]: [place] [address] [place], [place], [address] Phone: [phone]   Fax:  [phone]  Physical Therapy Treatment  Patient Details  Name: Ebony Matthews MRN: [id number] Date of Birth: [birth year] Referring Provider (PT): Referred by: Georgiann Cocker, MD. Followed by Shon Millet, MD   Encounter Date: 07/02/2020   PT End of Session - 07/02/20 1006    Visit Number 15    Number of Visits 16    Date for PT Re-Evaluation 08/21/20    Authorization Type UHC    PT Start Time 1015    PT Stop Time 1105    PT Time Calculation (min) 50 min    Equipment Utilized During Treatment Gait belt;Other (comment)    Activity Tolerance Patient tolerated treatment well    Behavior During Therapy [facility] for tasks assessed/performed;Flat affect           Past Medical History:  Diagnosis Date   Diabetes mellitus without complication (HCC)    Hypertension    TIA (transient ischemic attack) 01/22/2019    Past Surgical History:  Procedure Laterality Date   NO PAST SURGERIES      There were no vitals filed for this visit.   Subjective Assessment - 07/02/20 1129    Subjective Family states that they have been waiting to get a gait belt for safety prior to practicing ambulation at home. Pt has hemiwalker now. No recent falls noted. Pt reports no pain or discomfort with her left ankle.    Patient is accompained by: Family member    Pertinent History hx of multiple CVA, DM, HTN, drug abuse    Limitations Standing;Walking;House hold activities;Lifting    Currently in Pain? No/denies                             [facility] Adult PT Treatment/Exercise - 07/02/20 0001      Transfers   Transfers Sit to Stand;Stand to Sit;Stand Pivot Transfers    Sit to Stand 4: Min assist    Sit to Stand Details Tactile cues for weight shifting;Tactile cues for posture;Tactile cues for placement;Verbal cues for safe use of DME/AE;Manual  facilitation for weight shifting    Stand to Sit 4: Min guard    Stand to Sit Details (indicate cue type and reason) Verbal cues for technique;Verbal cues for precautions/safety    Stand Pivot Transfers 4: Min guard      Knee/Hip Exercises: Seated   Heel Slides AROM;Left;5 reps    Marching Left;Strengthening;10 reps    Marching Limitations with added knee flexion             Gait training: 1 x 230' with hemi walker, foot cover, toe up assist and CGA; cues to keep hips rotated forward, decrease circumduction, and slide foot vs kicking L foot forward. CGA for L knee hyperextension and cues for hemiwalker placement. Slide foot foward to cone x10 bilat to work on L LE coordination and then L LE weight bearing/dynamic balance Slide foot laterally to cone x10 bilat to work on L LE coordination/hip strength and then L LE weight bearing/dynamic balance Weight shifting L<>R with min A x10 to encourage increased L weight bearing Tap foot on 4" step x10 bilat to work on L LE coordination and then L LE weight bearing/dynamic balance       PT Education - 07/02/20 1130    Education Details Discussed safe ambulation at home first with short distances;  use of sock or cloth over her foot so pt does not trip over her toes. Discussed possibility of AFO to assist with ankle dorsiflexion    Person(s) Educated Patient;Other (comment)    Methods Explanation;Demonstration;Handout    Comprehension Verbalized understanding;Verbal cues required;Need further instruction            PT Short Term Goals - 06/28/20 1007      PT SHORT TERM GOAL #1   Title Patient will report independence with HEP with caregiver asssitance (All STG: 06/13/20)    Baseline HEP established    Time 4    Period Weeks    Status Revised    Target Date 06/13/20      PT SHORT TERM GOAL #2   Title Pt will be be able to demonstrate ability to complete transfers from w/c to mat w/ LRAD with CGA to demo improved functional mobility     Baseline Requires min A and multiple verbal and tactile cues, min A and multiple cues for proper steps and to assist moving L LE due to scissoring (06/08/20), min A with hemi walker (pt doesn not have hemi walker at home yet); CGA with hemi walker 06/28/20    Time 3    Period Weeks    Status Achieved      PT SHORT TERM GOAL #3   Title Patient will demo ability to stand for at least 5 minutes without UE support to demo improved tolerance for standing to allow for completion of ADL's    Baseline Supervision for 1-2 minutes, RUE Support, able to stand for 5 min performing dynamic activities but require min to mod A due to L leg buckling; was able to walk 160' which took about 10-15'    Time 4    Period Weeks    Status Achieved    Target Date 06/08/20      PT SHORT TERM GOAL #4   Title Patient will demo ability to ambulate 75 ft w/ LRAD and Min A to demo improved functional mobility    Baseline 14 ft, Min A; 160 feet with heim walker, CGA to min A    Time 4    Period Weeks    Status Achieved    Target Date 06/08/20             PT Long Term Goals - 06/28/20 1008      PT LONG TERM GOAL #1   Title Patient will demo ability to complete transfers with LRAD and supervision for improved functional mobility (ALL LTG: 07/11/20)    Baseline Min A    Time 8    Period Weeks    Status Achieved      PT LONG TERM GOAL #2   Title Patient will demo ability to ambulate >150 ft with LRAD and CGA to demo improved functional mobility and ambulation within the home    Baseline 14 ft, Min A; 207' with CGA and hemi walker    Time 8    Period Weeks    Status Achieved      PT LONG TERM GOAL #3   Title Patient will demo ability to complete TUG <45 seconds to demo improved ambulation and mobility    Baseline no baseline established    Time 8    Period Weeks    Status Revised      PT LONG TERM GOAL #4   Title Patient and caregiver/family will be educated on fall prevention within the home to reduce  fall risk    Baseline dependent    Time 8    Period Weeks    Status New                 Plan - 07/02/20 1132    Clinical Impression Statement Pt able to increase amb distance to 230' with hemiwalker today. Pt requires cues to improve gait pattern and decrease hip circumduction. Rest of session focused on improving L LE motor control (especially working on segregating hip and knee movement) and improve L LE weight bearing/shifting.    Personal Factors and Comorbidities Comorbidity 3+;Past/Current Experience;Time since onset of injury/illness/exacerbation;Transportation;Behavior Pattern    Comorbidities DM, HTN, hx of multiple strokes in last 6 months    Examination-Activity Limitations Bathing;Bed Mobility;Caring for Others;Carry;Dressing;Hygiene/Grooming;Lift;Reach Overhead;Squat    Examination-Participation Restrictions Cleaning;Community Activity;Driving;Laundry;Yard Work;Occupation;Church;Meal Prep    Stability/Clinical Decision Making Evolving/Moderate complexity    Rehab Potential Good    PT Frequency 2x / week    PT Duration 8 weeks    PT Treatment/Interventions ADLs/Self Care Home Management;Gait training;Stair training;Functional mobility training;Therapeutic activities;Therapeutic exercise;Balance training;Neuromuscular re-education;Manual techniques;Orthotic Fit/Training;Patient/family education;Cognitive remediation;Passive range of motion;Energy conservation;DME Instruction;Electrical Stimulation    PT Next Visit Plan Did we get gait belt? Are we ambulating short distances at home? Continue to work on transfers. Activites to promote quad actvitiation. Continue gait training with hemiwalker    PT Home Exercise Plan seated hip march, bridge, transfer training (bed, commode), PROM L ankle    Consulted and Agree with Plan of Care Patient;Family member/caregiver    Family Member Consulted Aunt           Patient will benefit from skilled therapeutic intervention in order to  improve the following deficits and impairments:  Abnormal gait, Decreased activity tolerance, Decreased balance, Decreased cognition, Decreased coordination, Decreased endurance, Decreased knowledge of precautions, Decreased mobility, Decreased range of motion, Decreased safety awareness, Decreased strength, Difficulty walking, Impaired perceived functional ability, Impaired flexibility, Impaired sensation, Impaired tone, Impaired UE functional use, Postural dysfunction, Pain  Visit Diagnosis: Hemiplegia and hemiparesis following other cerebrovascular disease affecting left non-dominant side (HCC)  Muscle weakness (generalized)  Other abnormalities of gait and mobility  Unsteadiness on feet  Stiffness of left ankle, not elsewhere classified  Repeated falls     Problem List Patient Active Problem List   Diagnosis Date Noted   Uncontrolled type 2 diabetes mellitus with hyperglycemia, without long-term current use of insulin (HCC) 04/16/2020   CVA (cerebral vascular accident) (HCC) 04/15/2020   Cocaine use 04/15/2020   Acute CVA (cerebrovascular accident) (HCC) 03/04/2020   Stroke (cerebrum) (HCC) 03/03/2020   Weakness    Noncompliance with medication regimen    TIA (transient ischemic attack) 01/22/2019   Diabetes mellitus due to underlying condition without complications (HCC) 05/30/2014   Essential hypertension, benign 05/30/2014   Smoking 05/30/2014    Gaynell Eggleton April Ma L Preesha Benjamin PT, DPT 07/02/2020, 11:36 AM  [facility] [address] [place], [place], [address] Phone: [phone]   Fax:  [phone]  Name: Ebony Matthews MRN: [id number] Date of Birth: 1981-04-23

## 2020-07-05 ENCOUNTER — Ambulatory Visit: Payer: Commercial Managed Care - PPO

## 2020-07-06 ENCOUNTER — Encounter: Payer: Self-pay | Admitting: Occupational Therapy

## 2020-07-06 ENCOUNTER — Ambulatory Visit: Payer: Medicaid Other

## 2020-07-06 ENCOUNTER — Ambulatory Visit: Payer: Medicaid Other | Admitting: Occupational Therapy

## 2020-07-06 ENCOUNTER — Other Ambulatory Visit: Payer: Self-pay

## 2020-07-06 DIAGNOSIS — M25512 Pain in left shoulder: Secondary | ICD-10-CM

## 2020-07-06 DIAGNOSIS — R296 Repeated falls: Secondary | ICD-10-CM

## 2020-07-06 DIAGNOSIS — R2681 Unsteadiness on feet: Secondary | ICD-10-CM

## 2020-07-06 DIAGNOSIS — M25612 Stiffness of left shoulder, not elsewhere classified: Secondary | ICD-10-CM

## 2020-07-06 DIAGNOSIS — M6281 Muscle weakness (generalized): Secondary | ICD-10-CM

## 2020-07-06 DIAGNOSIS — R2689 Other abnormalities of gait and mobility: Secondary | ICD-10-CM

## 2020-07-06 DIAGNOSIS — R278 Other lack of coordination: Secondary | ICD-10-CM

## 2020-07-06 DIAGNOSIS — I69854 Hemiplegia and hemiparesis following other cerebrovascular disease affecting left non-dominant side: Secondary | ICD-10-CM | POA: Diagnosis not present

## 2020-07-06 NOTE — Patient Instructions (Signed)
Access Code: [email or web address] URL: [email or web address] Date: 07/06/2020 Prepared by: Jethro Bastos  Exercises Supine Heel Slide - 2 x daily - 7 x weekly - 1 sets - 10 reps Small Range Straight Leg Raise - 2 x daily - 7 x weekly - 1 sets - 10 reps Clamshell - 2 x daily - 7 x weekly - 1 sets - 10 reps Supine March - 1 x daily - 7 x weekly - 2 sets - 10 reps Hooklying Single Leg Bent Knee Fallouts with Resistance - 1 x daily - 7 x weekly - 2 sets - 10 reps  Patient Education Falls at Home Checklist

## 2020-07-06 NOTE — Patient Instructions (Signed)
Shoulder Flexion      Clasp hands together and raise arms above head, keeping elbows as straight as possible.  For the first week, ONLY go to shoulder height.  After the first week, you can go as high as is comfortable. Hold 3 seconds. Can be done sitting or lying. Do 5 repetitions, 2 times per day.       Copyright  VHI. All rights reserved.

## 2020-07-06 NOTE — Therapy (Signed)
[facility] [address] [place], [place], [address] Phone: [phone]   Fax:  [phone]  Occupational Therapy Treatment  Patient Details  Name: Ebony Matthews MRN: [id number] Date of Birth: 03-07-1981 Referring Provider (OT): Dr. Everlena Cooper   Encounter Date: 07/06/2020   OT End of Session - 07/06/20 0848    Visit Number 2    Number of Visits 25    Date for OT Re-Evaluation 09/10/20    Authorization Type UHC Medicaid    Authorization Time Period VL:MN - auth required after 25th visit    Authorization - Visit Number 2    Authorization - Number of Visits 10    Progress Note Due on Visit 10    OT Start Time 0847    OT Stop Time 0928    OT Time Calculation (min) 41 min    Equipment Utilized During Treatment gait belt    Activity Tolerance Patient tolerated treatment well    Behavior During Therapy [facility] for tasks assessed/performed;Flat affect           Past Medical History:  Diagnosis Date  . Diabetes mellitus without complication (HCC)   . Hypertension   . TIA (transient ischemic attack) 01/22/2019    Past Surgical History:  Procedure Laterality Date  . NO PAST SURGERIES      There were no vitals filed for this visit.   Subjective Assessment - 07/06/20 1317    Subjective  Pt denies any pain.    Patient is accompanied by: Family member    Pertinent History PMH of CVA 2020, CVA June 2021 (R cerebellar) HTN, IIDM, HLD, on going cocaine/polysubstance abuse, non-compliant with medications and presented with new onset of left arm weakness and numbness.    Limitations Fall Risk. Not Driving. Left Inattention    Patient Stated Goals "to be able to move my arms" and "to be able to hold on and grip on stuff" with LUE    Currently in Pain? No/denies                        OT Treatments/Exercises (OP) - 07/06/20 0853      Bed Mobility   Bed Mobility Sit to Supine    Sit to Supine Moderate  Assistance - Patient 50-74%      ADLs   LB Dressing max A for UB dressing. initiated UB techniques      Neurological Re-education Exercises   Shoulder Flexion PROM;Self ROM;Left;10 reps;Supine;Seated    Other Exercises 1 PROM and Mobilization to LUE. Pt with no active movement present this day.     Other Weight-Bearing Exercises 1 weight bearing on mat into LUE for facilitation and proprioception      Manual Therapy   Manual Therapy Passive ROM    Passive ROM LUE all planes and joints ROM.                  OT Education - 07/06/20 1318    Education Details Issued Self PROM HEP    Person(s) Educated Patient;Other (comment)   aunt   Methods Explanation;Demonstration    Comprehension Verbalized understanding;Need further instruction;Returned demonstration            OT Short Term Goals - 06/18/20 1223      OT SHORT TERM GOAL #1   Title Pt will be independent with HEP 07/16/2020    Baseline HEP not issued yet    Time 4    Period  Weeks    Status New    Target Date 07/16/20      OT SHORT TERM GOAL #2   Title Pt will perform UB dressing with min A and implementing hemitechniques and adapted strategies PRN in order to increase independence with ADLs    Baseline max A for UB dressing    Time 4    Period Weeks    Status New      OT SHORT TERM GOAL #3   Title Pt will perform bathing with mod A and implementing adapted strategies PRN in order to increase independence with ADLs    Baseline total A currently for all bathing    Time 4    Period Weeks    Status New      OT SHORT TERM GOAL #4   Title Patient will demonstrate safe positioning and handling for LUE during transfers with min verbal cues 4/5 trials in order to decrease pain and risk of injury.    Baseline currently severe left inattention to LUE    Time 4    Period Weeks    Status New      OT SHORT TERM GOAL #5   Title Pt will complete table top scanning with 50 % accuracy in order to increase awareness to  left hemisphere.    Baseline 29/121 or approx 24%    Time 4    Period Weeks    Status New      OT SHORT TERM GOAL #6   Title Patient will prepare a light snack with mod A and adapted strategies PRN in order to increase independence with IADLs.    Baseline total A for snack prep    Time 4    Period Weeks    Status New             OT Long Term Goals - 06/18/20 1545      OT LONG TERM GOAL #1   Title Pt will be independent with updated HEP 08/13/20    Baseline not implemented .    Time 12    Period Weeks    Status New    Target Date 08/13/20      OT LONG TERM GOAL #2   Title Pt will perform UB dressing with supervision and implementing hemitechniques and adapted strategies PRN in order to increase independence with ADLs    Baseline max A for UB dressing    Time 12    Period Weeks    Status New      OT LONG TERM GOAL #3   Title Pt will perform bathing with min A and implementing adapted strategies PRN in order to increase independence with ADLs    Baseline total A for bathing at evaluation    Time 12    Period Weeks    Status New      OT LONG TERM GOAL #4   Title Patient will demonstrate safe positioning and handling for LUE during transfers Independently (with no verbal cues) 4/5 trials in order to decrease pain and risk of injury.    Baseline currently left inattention to LUE    Time 12    Period Weeks    Status New      OT LONG TERM GOAL #5   Title Pt will complete table top scanning with 75% accuracy in order to increase awareness to left hemisphere.    Baseline 24% accuracy -29/121 double digit cancellation    Time 12    Period  Weeks    Status New      OT LONG TERM GOAL #6   Title Patient will prepare a light snack with min A and adapted strategies PRN in order to increase independence with IADLs.    Baseline not preparing snacks    Time 12    Period Weeks    Status New      OT LONG TERM GOAL #7   Title Pt will utilize LUE as a stabilizer with no more  than 2 verbal cues in order to increase independence and use of LUE.    Time 12    Period Weeks    Status New                 Plan - 07/06/20 1313    Clinical Impression Statement Patient continues to present with hypotonicity in LUE and minimal awareness of LUE.    OT Occupational Profile and History Detailed Assessment- Review of Records and additional review of physical, cognitive, psychosocial history related to current functional performance    Occupational performance deficits (Please refer to evaluation for details): IADL's;ADL's;Rest and Sleep;Education    Body Structure / Function / Physical Skills ADL;Strength;Gait;Decreased knowledge of use of DME;Balance;Pain;UE functional use;GMC;Dexterity;Endurance;IADL;ROM;Vision;Coordination;Flexibility;Mobility;Sensation;FMC;Decreased knowledge of precautions    Cognitive Skills Attention;Safety Awareness;Problem Solve;Understand;Sequencing    Rehab Potential Good    Clinical Decision Making Several treatment options, min-mod task modification necessary    Comorbidities Affecting Occupational Performance: May have comorbidities impacting occupational performance    Modification or Assistance to Complete Evaluation  Min-Moderate modification of tasks or assist with assess necessary to complete eval    OT Frequency 2x / week    OT Duration 12 weeks    OT Treatment/Interventions Self-care/ADL training;Moist Heat;DME and/or AE instruction;Splinting;Balance training;Fluidtherapy;Gait Training;Therapeutic activities;Therapeutic exercise;Ultrasound;Electrical Stimulation;Energy conservation;Manual Therapy;Patient/family education;Visual/perceptual remediation/compensation;Passive range of motion;Functional Mobility Training;Neuromuscular education    Plan review UB dressing with hemi techniques, NMR LUE    Consulted and Agree with Plan of Care Patient;Family member/caregiver    Family Member Consulted Aunt           Patient will benefit  from skilled therapeutic intervention in order to improve the following deficits and impairments:   Body Structure / Function / Physical Skills: ADL, Strength, Gait, Decreased knowledge of use of DME, Balance, Pain, UE functional use, GMC, Dexterity, Endurance, IADL, ROM, Vision, Coordination, Flexibility, Mobility, Sensation, FMC, Decreased knowledge of precautions Cognitive Skills: Attention, Safety Awareness, Problem Solve, Understand, Sequencing     Visit Diagnosis: Hemiplegia and hemiparesis following other cerebrovascular disease affecting left non-dominant side (HCC)  Muscle weakness (generalized)  Other abnormalities of gait and mobility  Unsteadiness on feet  Other lack of coordination  Stiffness of left shoulder, not elsewhere classified  Acute pain of left shoulder    Problem List Patient Active Problem List   Diagnosis Date Noted  . Uncontrolled type 2 diabetes mellitus with hyperglycemia, without long-term current use of insulin (HCC) 04/16/2020  . CVA (cerebral vascular accident) (HCC) 04/15/2020  . Cocaine use 04/15/2020  . Acute CVA (cerebrovascular accident) (HCC) 03/04/2020  . Stroke (cerebrum) (HCC) 03/03/2020  . Weakness   . Noncompliance with medication regimen   . TIA (transient ischemic attack) 01/22/2019  . Diabetes mellitus due to underlying condition without complications (HCC) 05/30/2014  . Essential hypertension, benign 05/30/2014  . Smoking 05/30/2014    Junious Dresser MOT, OTR/L  07/06/2020, 1:21 PM  [facility] [address] [place], [place], [address] Phone:  [phone]   Fax:  [phone]  Name: Ebony Matthews MRN: [id number] Date of Birth: 1980-12-29

## 2020-07-06 NOTE — Therapy (Signed)
[facility] [address] [place], [place], [address] Phone: [phone]   Fax:  [phone]  Physical Therapy Treatment  Patient Details  Name: Ebony Matthews MRN: [id number] Date of Birth: 11-16-1980 Referring Provider (PT): Referred by: Jordan Hawks, MD. Followed by Metta Clines, MD   Encounter Date: 07/06/2020   PT End of Session - 07/06/20 0935    Visit Number 16    Number of Visits 28    Date for PT Re-Evaluation 10/04/20   POC for 6 weeks, Cert for 90 days   Authorization Type U[employer]    PT Start Time [phone]    PT Stop Time 1014    PT Time Calculation (min) 41 min    Equipment Utilized During Treatment Gait belt;Other (comment)    Activity Tolerance Patient tolerated treatment well    Behavior During Therapy [facility] for tasks assessed/performed;Flat affect           Past Medical History:  Diagnosis Date  . Diabetes mellitus without complication ([place])   . Hypertension   . TIA (transient ischemic attack) 01/22/2019    Past Surgical History:  Procedure Laterality Date  . NO PAST SURGERIES      There were no vitals filed for this visit.   Subjective Assessment - 07/06/20 0932    Subjective Patient is no longer wearing boot on L ankle. No pain in the ankle to report. No falls. Aunt reports that they have not been able to get a gait belt at this time. patient reports that she is able to lift her leg up now.    Patient is accompained by: Family member    Pertinent History hx of multiple CVA, DM, HTN, drug abuse    Limitations Standing;Walking;House hold activities;Lifting    Currently in Pain? No/denies              [facility] PT Assessment - 07/06/20 0951      Assessment   Medical Diagnosis CVA with ankle fracture L    Referring Provider (PT) Referred by: Jordan Hawks, MD. Followed by Metta Clines, MD                         [facility] Adult PT Treatment/Exercise - 07/06/20 0951      Transfers    Transfers Sit to Stand;Stand to Sit;Stand Pivot Transfers    Sit to Stand 5: Supervision;4: Min guard    Sit to Stand Details Tactile cues for weight shifting;Tactile cues for posture;Tactile cues for placement;Verbal cues for safe use of DME/AE;Manual facilitation for weight shifting    Stand to Sit 5: Supervision;4: Min guard    Stand to Sit Details (indicate cue type and reason) Verbal cues for technique;Verbal cues for precautions/safety    Stand Pivot Transfers 5: Supervision;4: Min guard    Stand Pivot Transfer Details (indicate cue type and reason) transfer from w/c <> mat. verbal cues required for stepping sequence and placement of LLE       Ambulation/Gait   Ambulation/Gait Yes    Ambulation/Gait Assistance 4: Min guard;4: Min assist    Ambulation/Gait Assistance Details completed ambulation trial with L posteiror ottobok placed on LLE. Patient demo improvements with L ankle dorsiflexion and clearance, however no changes noted in knee recurvatum. Patient continues to demo dificulty with LLE placement at times and often swings leg out.     Ambulation Distance (Feet) 50 Feet    Assistive device Hemi-walker    Gait Pattern  Step-to pattern;Decreased arm swing - left;Decreased arm swing - right;Decreased step length - left;Decreased stance time - right;Decreased hip/knee flexion - right;Decreased hip/knee flexion - left;Decreased dorsiflexion - left;Decreased weight shift to left    Ambulation Surface Level;Indoor      Standardized Balance Assessment   Standardized Balance Assessment Timed Up and Go Test      Timed Up and Go Test   TUG Normal TUG    Normal TUG (seconds) --   1 min, 32 secs w/ Hemiwalker     Self-Care   Self-Care Other Self-Care Comments    Other Self-Care Comments  Educated patient and aunt on fall prevention strategies and checklist to promote improved safety within the home. Some aspects currently do not apply as patient is limtied in mobility within the home, however  educating on with progress to prepare the home for improved safety with ambulation as patient continues to progress with PT services. Patient and aunt verbalized understanding. Handout provided (see [employer] program for details)                   PT Education - 07/06/20 1315    Education Details Progress toward LTG's    Person(s) Educated Patient;Other (comment)   Aunt   Methods Explanation    Comprehension Verbalized understanding            PT Short Term Goals - 06/28/20 1007      PT SHORT TERM GOAL #1   Title Patient will report independence with HEP with caregiver asssitance (All STG: 06/13/20)    Baseline HEP established    Time 4    Period Weeks    Status Revised    Target Date 06/13/20      PT SHORT TERM GOAL #2   Title Pt will be be able to demonstrate ability to complete transfers from w/c to mat w/ LRAD with CGA to demo improved functional mobility    Baseline Requires min A and multiple verbal and tactile cues, min A and multiple cues for proper steps and to assist moving L LE due to scissoring (06/08/20), min A with hemi walker (pt doesn not have hemi walker at home yet); CGA with hemi walker 06/28/20    Time 3    Period Weeks    Status Achieved      PT SHORT TERM GOAL #3   Title Patient will demo ability to stand for at least 5 minutes without UE support to demo improved tolerance for standing to allow for completion of ADL's    Baseline Supervision for 1-2 minutes, RUE Support, able to stand for 5 min performing dynamic activities but require min to mod A due to L leg buckling; was able to walk 160' which took about 10-15'    Time 4    Period Weeks    Status Achieved    Target Date 06/08/20      PT SHORT TERM GOAL #4   Title Patient will demo ability to ambulate 72 ft w/ LRAD and Min A to demo improved functional mobility    Baseline 14 ft, Min A; 160 feet with heim walker, CGA to min A    Time 4    Period Weeks    Status Achieved    Target Date  06/08/20             PT Long Term Goals - 07/06/20 0945      PT LONG TERM GOAL #1   Title Patient will demo ability  to complete transfers with LRAD and supervision for improved functional mobility (ALL LTG: 07/11/20)    Baseline Min A    Time 8    Period Weeks    Status Achieved      PT LONG TERM GOAL #2   Title Patient will demo ability to ambulate >150 ft with LRAD and CGA to demo improved functional mobility and ambulation within the home    Baseline 14 ft, Min A; 207' with CGA and hemi walker    Time 8    Period Weeks    Status Achieved      PT LONG TERM GOAL #3   Title Patient will demo ability to complete TUG <45 seconds to demo improved ambulation and mobility    Baseline 1 min 32 secs    Time 8    Period Weeks    Status Not Met      PT LONG TERM GOAL #4   Title Patient and caregiver/family will be educated on fall prevention within the home to reduce fall risk    Baseline patient/aunt eduated on fall prevention.    Time 8    Period Weeks    Status Achieved          Updated Short Term Goals:   PT Short Term Goals - 07/06/20 1332      PT SHORT TERM GOAL #1   Title Patient and caregiver will report walking daily in home for improved household mobility (ALL STGs Due: 07/27/2020)    Baseline not currently walking daily    Time 3    Period Weeks    Status New    Target Date 07/27/20      PT SHORT TERM GOAL #2   Title Patient will improve TUG to <1 min to demonstrate improved functional mobility    Baseline 1 min, 32 secs    Time 3    Period Weeks    Status New      PT SHORT TERM GOAL #3   Title Patient will improve gait speed to > 1.0 ft sec to demonstrate improved household mobility    Baseline not yet assessed    Time 3    Period Weeks    Status New           Updated Long Term Goals:  PT Long Term Goals - 07/06/20 1335      PT LONG TERM GOAL #1   Title Patient will report independence with progressive HEP with caregiver asssistance (ALL  LTG's Due: 08/17/20)    Baseline continue to progress HEP    Time 6    Period Weeks    Status New    Target Date 08/17/20      PT LONG TERM GOAL #2   Title Patient will demo ability to ambulate >250 ft with LRAD and CGA to demo improved functional mobility and ambulation within the home    Baseline 14 ft, Min A; 207' with CGA and hemi walker    Time 6    Period Weeks    Status New      PT LONG TERM GOAL #3   Title Patient will demo ability to complete TUG  < 50 seconds to demo improved ambulation and mobility    Baseline 1 min 32 secs    Time 6    Period Weeks    Status Revised      PT LONG TERM GOAL #4   Title Patient will improve gait speed to >/= 1.5  ft/sec to demonstrate improved mobility    Baseline not yet assessed    Time 6    Period Weeks    Status New                Plan - 07/06/20 1326    Clinical Impression Statement Today's skilled PT session included assessment of patient's progress toward all LTG's. Patient demo progress toward all goals today and demosntrating improved mobility. Patient still is at increased risk for falls with TUG time of 1 min 32 secs with use of hemiwalker. PT educating patient/aunt on fall prevention within the home for improved safety. Patient has made progress with PT services and will continue to benefit from skilled PT services.    Personal Factors and Comorbidities Comorbidity 3+;Past/Current Experience;Time since onset of injury/illness/exacerbation;Transportation;Behavior Pattern    Comorbidities DM, HTN, hx of multiple strokes in last 6 months    Examination-Activity Limitations Bathing;Bed Mobility;Caring for Others;Carry;Dressing;Hygiene/Grooming;Lift;Reach Overhead;Squat    Examination-Participation Restrictions Cleaning;Community Activity;Driving;Laundry;Yard Work;Occupation;Church;Meal Prep    Stability/Clinical Decision Making Evolving/Moderate complexity    Rehab Potential Good    PT Frequency 2x / week    PT Duration 6  weeks    PT Treatment/Interventions ADLs/Self Care Home Management;Gait training;Stair training;Functional mobility training;Therapeutic activities;Therapeutic exercise;Balance training;Neuromuscular re-education;Manual techniques;Orthotic Fit/Training;Patient/family education;Cognitive remediation;Passive range of motion;Energy conservation;DME Instruction;Electrical Stimulation;Joint Manipulations    PT Next Visit Plan Did we get gait belt? Are we ambulating short distances at home? Continue to work on transfers. Activites to promote quad actvitiation. Continue gait training with hemiwalker    PT Home Exercise Plan seated hip march, bridge, transfer training (bed, commode), PROM L ankle    Consulted and Agree with Plan of Care Patient;Family member/caregiver    Family Member Consulted Aunt           Patient will benefit from skilled therapeutic intervention in order to improve the following deficits and impairments:  Abnormal gait, Decreased activity tolerance, Decreased balance, Decreased cognition, Decreased coordination, Decreased endurance, Decreased knowledge of precautions, Decreased mobility, Decreased range of motion, Decreased safety awareness, Decreased strength, Difficulty walking, Impaired perceived functional ability, Impaired flexibility, Impaired sensation, Impaired tone, Impaired UE functional use, Postural dysfunction, Pain  Visit Diagnosis: Hemiplegia and hemiparesis following other cerebrovascular disease affecting left non-dominant side (HCC)  Muscle weakness (generalized)  Other abnormalities of gait and mobility  Unsteadiness on feet  Repeated falls     Problem List Patient Active Problem List   Diagnosis Date Noted  . Uncontrolled type 2 diabetes mellitus with hyperglycemia, without long-term current use of insulin ([place]) 04/16/2020  . CVA (cerebral vascular accident) ([place]) 04/15/2020  . Cocaine use 04/15/2020  . Acute CVA (cerebrovascular accident) ([place])  03/04/2020  . Stroke (cerebrum) ([place]) 03/03/2020  . Weakness   . Noncompliance with medication regimen   . TIA (transient ischemic attack) 01/22/2019  . Diabetes mellitus due to underlying condition without complications ([place]) [id number]/5465  . Essential hypertension, benign 05/30/2014  . Smoking 05/30/2014    Jones Bales, PT, DPT 07/06/2020, 1:31 PM  [facility] [address] [place], [place], [address] Phone: [phone]   Fax:  [phone]  Name: Ebony Matthews MRN: [id number] Date of Birth: [birth year]

## 2020-07-09 ENCOUNTER — Other Ambulatory Visit: Payer: Self-pay

## 2020-07-09 ENCOUNTER — Ambulatory Visit: Payer: Medicaid Other

## 2020-07-09 DIAGNOSIS — M6281 Muscle weakness (generalized): Secondary | ICD-10-CM

## 2020-07-09 DIAGNOSIS — I69854 Hemiplegia and hemiparesis following other cerebrovascular disease affecting left non-dominant side: Secondary | ICD-10-CM | POA: Diagnosis not present

## 2020-07-09 DIAGNOSIS — R2681 Unsteadiness on feet: Secondary | ICD-10-CM

## 2020-07-09 DIAGNOSIS — R2689 Other abnormalities of gait and mobility: Secondary | ICD-10-CM

## 2020-07-09 NOTE — Therapy (Signed)
[facility] [address] [place], [place], [address] Phone: [phone]   Fax:  [phone]  Physical Therapy Treatment  Patient Details  Name: Ebony Matthews MRN: [id number] Date of Birth: 09/24/1981 Referring Provider (PT): Referred by: Georgiann Cocker, MD. Followed by Shon Millet, MD   Encounter Date: 07/09/2020   PT End of Session - 07/09/20 1107    Visit Number 17    Number of Visits 28    Date for PT Re-Evaluation 10/04/20   POC for 6 weeks, Cert for 90 days   Authorization Type U[employer]    PT Start Time 1102    PT Stop Time 1145    PT Time Calculation (min) 43 min    Equipment Utilized During Treatment Gait belt;Other (comment)    Activity Tolerance Patient tolerated treatment well    Behavior During Therapy [facility] for tasks assessed/performed;Flat affect           Past Medical History:  Diagnosis Date   Diabetes mellitus without complication (HCC)    Hypertension    TIA (transient ischemic attack) 01/22/2019    Past Surgical History:  Procedure Laterality Date   NO PAST SURGERIES      There were no vitals filed for this visit.   Subjective Assessment - 07/09/20 1106    Subjective Patient reports no new changes since last visit. No pain in ankle. No falls to report. patient/aunt reports walking in house at this time daily.    Patient is accompained by: Family member    Pertinent History hx of multiple CVA, DM, HTN, drug abuse    Limitations Standing;Walking;House hold activities;Lifting    Currently in Pain? No/denies                             [facility] Adult PT Treatment/Exercise - 07/09/20 0001      Transfers   Transfers Sit to Stand;Stand to Sit;Stand Pivot Transfers    Sit to Stand 5: Supervision;4: Min guard    Sit to Stand Details Tactile cues for weight shifting;Tactile cues for posture;Tactile cues for placement;Verbal cues for safe use of DME/AE;Manual facilitation for  weight shifting    Stand to Sit 5: Supervision;4: Min guard    Stand to Sit Details (indicate cue type and reason) Verbal cues for technique;Verbal cues for precautions/safety    Stand Pivot Transfers 5: Supervision;4: Min guard    Stand Pivot Transfer Details (indicate cue type and reason) transfer from w/c <> nustep. verbal cues for use of hemi-walker as patient had turned inappropriately      Ambulation/Gait   Ambulation/Gait Yes    Ambulation/Gait Assistance 4: Min guard;4: Min assist    Ambulation/Gait Assistance Details completed ambulation x 100 ft with AD, with PT providing verbal cues/tactile for improved muscular control in LLE, limited control noted due to decreased muscle strength. Patient require min A at times for placement on LLE.     Ambulation Distance (Feet) 100 Feet    Assistive device Hemi-walker    Gait Pattern Step-to pattern;Decreased arm swing - left;Decreased arm swing - right;Decreased step length - left;Decreased stance time - right;Decreased hip/knee flexion - right;Decreased hip/knee flexion - left;Decreased dorsiflexion - left;Decreased weight shift to left    Ambulation Surface Level;Indoor      Neuro Re-ed    Neuro Re-ed Details  standing in // bars with single UE support from RUE, completed standing weight shifts with staggered stance, completed x  10 reps each direction. PT providing verbal cues for diagonal weight shift through pelvis. manual faciliation for LLE needed. Completed standing tolerance activity without UE support, 3 x 1 minute each, PT working to faciliate improved posture and alignment with completion. With colored target placed on floor anterior and laterally, completed alternating toe tap to target in each direction x 10 reps each.       Knee/Hip Exercises: Aerobic   Nustep Completed NuStep x 5 minutes with BLE at Level 2. PT providing Min A to keep LLE in neutral alignment at times. Patient demo decreased awareness of L side, and inattention often  requiring verbal/tactile cues to focus on activity at hand.                     PT Short Term Goals - 07/06/20 1332      PT SHORT TERM GOAL #1   Title Patient and caregiver will report walking daily in home for improved household mobility (ALL STGs Due: 07/27/2020)    Baseline not currently walking daily    Time 3    Period Weeks    Status New    Target Date 07/27/20      PT SHORT TERM GOAL #2   Title Patient will improve TUG to <1 min to demonstrate improved functional mobility    Baseline 1 min, 32 secs    Time 3    Period Weeks    Status New      PT SHORT TERM GOAL #3   Title Patient will improve gait speed to > 1.0 ft sec to demonstrate improved household mobility    Baseline not yet assessed    Time 3    Period Weeks    Status New             PT Long Term Goals - 07/06/20 1335      PT LONG TERM GOAL #1   Title Patient will report independence with progressive HEP with caregiver asssistance (ALL LTG's Due: 08/17/20)    Baseline continue to progress HEP    Time 6    Period Weeks    Status New    Target Date 08/17/20      PT LONG TERM GOAL #2   Title Patient will demo ability to ambulate >250 ft with LRAD and CGA to demo improved functional mobility and ambulation within the home    Baseline 14 ft, Min A; 207' with CGA and hemi walker    Time 6    Period Weeks    Status New      PT LONG TERM GOAL #3   Title Patient will demo ability to complete TUG  < 50 seconds to demo improved ambulation and mobility    Baseline 1 min 32 secs    Time 6    Period Weeks    Status Revised      PT LONG TERM GOAL #4   Title Patient will improve gait speed to >/= 1.5 ft/sec to demonstrate improved mobility    Baseline not yet assessed    Time 6    Period Weeks    Status New                 Plan - 07/09/20 1159    Clinical Impression Statement Today's skilled PT session included continued gait training with hemi-walker. Completed NMR in // bars working  on improved weight shift and muscular control of LLE. Increased difficulty noted due to muscle weakness.Max  verbal/tactile cues required and CGA/Min A throughout session.    Personal Factors and Comorbidities Comorbidity 3+;Past/Current Experience;Time since onset of injury/illness/exacerbation;Transportation;Behavior Pattern    Comorbidities DM, HTN, hx of multiple strokes in last 6 months    Examination-Activity Limitations Bathing;Bed Mobility;Caring for Others;Carry;Dressing;Hygiene/Grooming;Lift;Reach Overhead;Squat    Examination-Participation Restrictions Cleaning;Community Activity;Driving;Laundry;Yard Work;Occupation;Church;Meal Prep    Stability/Clinical Decision Making Evolving/Moderate complexity    Rehab Potential Good    PT Frequency 2x / week    PT Duration 6 weeks    PT Treatment/Interventions ADLs/Self Care Home Management;Gait training;Stair training;Functional mobility training;Therapeutic activities;Therapeutic exercise;Balance training;Neuromuscular re-education;Manual techniques;Orthotic Fit/Training;Patient/family education;Cognitive remediation;Passive range of motion;Energy conservation;DME Instruction;Electrical Stimulation;Joint Manipulations    PT Next Visit Plan Did we get gait belt? Continue to work on transfers. Activites to promote quad actvitiation. Continue gait training with hemiwalker    PT Home Exercise Plan seated hip march, bridge, transfer training (bed, commode), PROM L ankle    Consulted and Agree with Plan of Care Patient;Family member/caregiver    Family Member Consulted Aunt           Patient will benefit from skilled therapeutic intervention in order to improve the following deficits and impairments:  Abnormal gait, Decreased activity tolerance, Decreased balance, Decreased cognition, Decreased coordination, Decreased endurance, Decreased knowledge of precautions, Decreased mobility, Decreased range of motion, Decreased safety awareness, Decreased  strength, Difficulty walking, Impaired perceived functional ability, Impaired flexibility, Impaired sensation, Impaired tone, Impaired UE functional use, Postural dysfunction, Pain  Visit Diagnosis: Hemiplegia and hemiparesis following other cerebrovascular disease affecting left non-dominant side (HCC)  Muscle weakness (generalized)  Other abnormalities of gait and mobility  Unsteadiness on feet     Problem List Patient Active Problem List   Diagnosis Date Noted   Uncontrolled type 2 diabetes mellitus with hyperglycemia, without long-term current use of insulin (HCC) 04/16/2020   CVA (cerebral vascular accident) (HCC) 04/15/2020   Cocaine use 04/15/2020   Acute CVA (cerebrovascular accident) (HCC) 03/04/2020   Stroke (cerebrum) (HCC) 03/03/2020   Weakness    Noncompliance with medication regimen    TIA (transient ischemic attack) 01/22/2019   Diabetes mellitus due to underlying condition without complications (HCC) 05/30/2014   Essential hypertension, benign 05/30/2014   Smoking 05/30/2014    Tempie Donning, PT, DPT 07/09/2020, 12:04 PM  [facility] [address] [place], [place], [address] Phone: [phone]   Fax:  [phone]  Name: Kemara Quigley MRN: [id number] Date of Birth: Jul 23, 1981

## 2020-07-12 ENCOUNTER — Ambulatory Visit: Payer: Commercial Managed Care - PPO

## 2020-07-13 ENCOUNTER — Encounter: Payer: Self-pay | Admitting: Occupational Therapy

## 2020-07-13 ENCOUNTER — Other Ambulatory Visit: Payer: Self-pay

## 2020-07-13 ENCOUNTER — Ambulatory Visit: Payer: Medicaid Other | Admitting: Occupational Therapy

## 2020-07-13 ENCOUNTER — Ambulatory Visit: Payer: Medicaid Other

## 2020-07-13 DIAGNOSIS — I69854 Hemiplegia and hemiparesis following other cerebrovascular disease affecting left non-dominant side: Secondary | ICD-10-CM

## 2020-07-13 DIAGNOSIS — R278 Other lack of coordination: Secondary | ICD-10-CM

## 2020-07-13 DIAGNOSIS — R2689 Other abnormalities of gait and mobility: Secondary | ICD-10-CM

## 2020-07-13 DIAGNOSIS — M25612 Stiffness of left shoulder, not elsewhere classified: Secondary | ICD-10-CM

## 2020-07-13 DIAGNOSIS — M6281 Muscle weakness (generalized): Secondary | ICD-10-CM

## 2020-07-13 DIAGNOSIS — M25512 Pain in left shoulder: Secondary | ICD-10-CM

## 2020-07-13 DIAGNOSIS — R2681 Unsteadiness on feet: Secondary | ICD-10-CM

## 2020-07-13 NOTE — Therapy (Signed)
[facility] [address] [place], [place], [address] Phone: [phone]   Fax:  [phone]  Physical Therapy Treatment  Patient Details  Name: Ebony Matthews MRN: [id number] Date of Birth: 07/07/1981 Referring Provider (PT): Referred by: Georgiann Cocker, MD. Followed by Shon Millet, MD   Encounter Date: 07/13/2020   PT End of Session - 07/13/20 0805    Visit Number 18    Number of Visits 28    Date for PT Re-Evaluation 10/04/20   POC for 6 weeks, Cert for 90 days   Authorization Type UHC    PT Start Time 0801    PT Stop Time 0845    PT Time Calculation (min) 44 min    Equipment Utilized During Treatment Gait belt;Other (comment)    Activity Tolerance Patient tolerated treatment well    Behavior During Therapy [facility] for tasks assessed/performed;Flat affect           Past Medical History:  Diagnosis Date  . Diabetes mellitus without complication (HCC)   . Hypertension   . TIA (transient ischemic attack) 01/22/2019    Past Surgical History:  Procedure Laterality Date  . NO PAST SURGERIES      There were no vitals filed for this visit.   Subjective Assessment - 07/13/20 0804    Subjective No new changes/complaints since last visit. No falls.    Patient is accompained by: Family member    Pertinent History hx of multiple CVA, DM, HTN, drug abuse    Limitations Standing;Walking;House hold activities;Lifting    Currently in Pain? No/denies                   [facility] Adult PT Treatment/Exercise - 07/13/20 0001      Bed Mobility   Bed Mobility Sit to Supine;Supine to Sit    Supine to Sit Contact Guard/Touching assist    Sit to Supine Contact Guard/Touching assist      Transfers   Transfers Sit to Stand;Stand to Sit;Stand Pivot Transfers    Sit to Stand 5: Supervision;4: Min guard    Sit to Stand Details Tactile cues for weight shifting;Tactile cues for posture;Tactile cues for placement;Verbal cues  for safe use of DME/AE;Manual facilitation for weight shifting    Sit to Stand Details (indicate cue type and reason) patient require verbal cues and Min A for placement of LLE    Stand to Sit 5: Supervision;4: Min guard    Stand to Sit Details (indicate cue type and reason) Verbal cues for technique;Verbal cues for precautions/safety    Stand Pivot Transfers 5: Supervision;4: Min guard    Stand Pivot Transfer Details (indicate cue type and reason) transfer from mat <> w/c. placement for LLE      Ambulation/Gait   Ambulation/Gait Yes    Ambulation/Gait Assistance 4: Min guard;4: Min assist    Ambulation/Gait Assistance Details completed ambulation x 125 ft with L Posterior Ottobok donned with 1/2 inch heel wedge. Patient demo reduced knee recurvatum with addition of heel wedge as well as with tactile cues at posterior knee and verbal cues to avoid letting LLE snapback. Patient also require max verbal cues during ambulation to keep hemiwalker aligned properly as well as keeping hips aligned forward to ensure we are tracking/ambulating forwards. Min A required for placement of LLE at times.     Ambulation Distance (Feet) 125 Feet    Assistive device Hemi-walker    Gait Pattern Step-to pattern;Decreased arm swing - left;Decreased arm swing -  right;Decreased step length - left;Decreased stance time - right;Decreased hip/knee flexion - right;Decreased hip/knee flexion - left;Decreased dorsiflexion - left;Decreased weight shift to left    Ambulation Surface Level;Indoor      Exercises   Exercises Other Exercises    Other Exercises  With patient in supine, completed terminal knee extension into towel x 10 reps, PT providing extensive cueing for proper completion, minimal contraction noted in L quad. PT completed PROM into DF x 10 reps for improved motion, with PT providing verbal cues for active assist to promote improved activation, minimal activation noted in L DF's. completed heel slides x 10 reps on  LLE with verbal cues for control. Patient require Min A for completion. Completed controlled hip abduction/adduction x 15 reps on LLE, patient require max verbal cues to attend to LLE during completion.                     PT Short Term Goals - 07/06/20 1332      PT SHORT TERM GOAL #1   Title Patient and caregiver will report walking daily in home for improved household mobility (ALL STGs Due: 07/27/2020)    Baseline not currently walking daily    Time 3    Period Weeks    Status New    Target Date 07/27/20      PT SHORT TERM GOAL #2   Title Patient will improve TUG to <1 min to demonstrate improved functional mobility    Baseline 1 min, 32 secs    Time 3    Period Weeks    Status New      PT SHORT TERM GOAL #3   Title Patient will improve gait speed to > 1.0 ft sec to demonstrate improved household mobility    Baseline not yet assessed    Time 3    Period Weeks    Status New             PT Long Term Goals - 07/06/20 1335      PT LONG TERM GOAL #1   Title Patient will report independence with progressive HEP with caregiver asssistance (ALL LTG's Due: 08/17/20)    Baseline continue to progress HEP    Time 6    Period Weeks    Status New    Target Date 08/17/20      PT LONG TERM GOAL #2   Title Patient will demo ability to ambulate >250 ft with LRAD and CGA to demo improved functional mobility and ambulation within the home    Baseline 14 ft, Min A; 207' with CGA and hemi walker    Time 6    Period Weeks    Status New      PT LONG TERM GOAL #3   Title Patient will demo ability to complete TUG  < 50 seconds to demo improved ambulation and mobility    Baseline 1 min 32 secs    Time 6    Period Weeks    Status Revised      PT LONG TERM GOAL #4   Title Patient will improve gait speed to >/= 1.5 ft/sec to demonstrate improved mobility    Baseline not yet assessed    Time 6    Period Weeks    Status New                 Plan - 07/13/20 0857      Clinical Impression Statement Continued activites NMR activities in supine/hooklying  to promote muscle activation in LLE, patient still demo minimal activation in L Quad and L DF's. Completed gait training with L Post Ottobok and Heel Wedge with patient demo improvements in knee recurvation. Patient still require tactile and verbal cues during ambulation. Will continue to progress toward all goals.    Personal Factors and Comorbidities Comorbidity 3+;Past/Current Experience;Time since onset of injury/illness/exacerbation;Transportation;Behavior Pattern    Comorbidities DM, HTN, hx of multiple strokes in last 6 months    Examination-Activity Limitations Bathing;Bed Mobility;Caring for Others;Carry;Dressing;Hygiene/Grooming;Lift;Reach Overhead;Squat    Examination-Participation Restrictions Cleaning;Community Activity;Driving;Laundry;Yard Work;Occupation;Church;Meal Prep    Stability/Clinical Decision Making Evolving/Moderate complexity    Rehab Potential Good    PT Frequency 2x / week    PT Duration 6 weeks    PT Treatment/Interventions ADLs/Self Care Home Management;Gait training;Stair training;Functional mobility training;Therapeutic activities;Therapeutic exercise;Balance training;Neuromuscular re-education;Manual techniques;Orthotic Fit/Training;Patient/family education;Cognitive remediation;Passive range of motion;Energy conservation;DME Instruction;Electrical Stimulation;Joint Manipulations    PT Next Visit Plan Continue to work on transfers. Activites to promote quad actvitiation. Continue gait training with hemiwalker    PT Home Exercise Plan seated hip march, bridge, transfer training (bed, commode), PROM L ankle    Consulted and Agree with Plan of Care Patient;Family member/caregiver    Family Member Consulted Aunt           Patient will benefit from skilled therapeutic intervention in order to improve the following deficits and impairments:  Abnormal gait, Decreased activity  tolerance, Decreased balance, Decreased cognition, Decreased coordination, Decreased endurance, Decreased knowledge of precautions, Decreased mobility, Decreased range of motion, Decreased safety awareness, Decreased strength, Difficulty walking, Impaired perceived functional ability, Impaired flexibility, Impaired sensation, Impaired tone, Impaired UE functional use, Postural dysfunction, Pain  Visit Diagnosis: Hemiplegia and hemiparesis following other cerebrovascular disease affecting left non-dominant side (HCC)  Muscle weakness (generalized)  Other abnormalities of gait and mobility  Unsteadiness on feet     Problem List Patient Active Problem List   Diagnosis Date Noted  . Uncontrolled type 2 diabetes mellitus with hyperglycemia, without long-term current use of insulin (HCC) 04/16/2020  . CVA (cerebral vascular accident) (HCC) 04/15/2020  . Cocaine use 04/15/2020  . Acute CVA (cerebrovascular accident) (HCC) 03/04/2020  . Stroke (cerebrum) (HCC) 03/03/2020  . Weakness   . Noncompliance with medication regimen   . TIA (transient ischemic attack) 01/22/2019  . Diabetes mellitus due to underlying condition without complications (HCC) 05/30/2014  . Essential hypertension, benign 05/30/2014  . Smoking 05/30/2014    Tempie Donning, PT, DPT 07/13/2020, 9:00 AM  [facility] [address] [place], [place], [address] Phone: [phone]   Fax:  [phone]  Name: Ebony Matthews MRN: [id number] Date of Birth: Jul 26, 1981

## 2020-07-13 NOTE — Therapy (Signed)
[facility] [address] [place], [place], [address] Phone: [phone]   Fax:  [phone]  Occupational Therapy Treatment  Patient Details  Name: Ebony Matthews MRN: [id number] Date of Birth: [birth year] Referring Provider (OT): Dr. Everlena Cooper   Encounter Date: 07/13/2020   OT End of Session - 07/13/20 0923    Visit Number 3    Number of Visits 25    Date for OT Re-Evaluation 09/10/20    Authorization Type U[employer] Medicaid    Authorization Time Period VL:MN - auth required after 25th visit    Authorization - Visit Number 3    Authorization - Number of Visits 10    Progress Note Due on Visit 10    OT Start Time 0847    OT Stop Time 0930    OT Time Calculation (min) 43 min    Equipment Utilized During Treatment gait belt    Activity Tolerance Patient tolerated treatment well    Behavior During Therapy [facility] for tasks assessed/performed;Flat affect           Past Medical History:  Diagnosis Date   Diabetes mellitus without complication (HCC)    Hypertension    TIA (transient ischemic attack) 01/22/2019    Past Surgical History:  Procedure Laterality Date   NO PAST SURGERIES      There were no vitals filed for this visit.   Subjective Assessment - 07/13/20 0848    Subjective  Pt denies any pain.    Patient is accompanied by: Family member    Pertinent History PMH of CVA 2020, CVA June 2021 (R cerebellar) HTN, IIDM, HLD, on going cocaine/polysubstance abuse, non-compliant with medications and presented with new onset of left arm weakness and numbness.    Limitations Fall Risk. Not Driving. Left Inattention    Patient Stated Goals "to be able to move my arms" and "to be able to hold on and grip on stuff" with LUE    Currently in Pain? No/denies                        OT Treatments/Exercises (OP) - 07/13/20 0924      Visual/Perceptual Exercises   Scanning Tabletop    Scanning - Tabletop  Perfection board with min verbal cues, increased time      Neurological Re-education Exercises   Weight Bearing Position Seated    Seated with weight on hand LUE WB - leaning forward x 10 and diagnoal reach x 10    Development of Reach Hand over hand assistance;Towel slides   UE Ranger in sidelying and seated, towel slides w max A LUE                   OT Short Term Goals - 07/13/20 0928      OT SHORT TERM GOAL #1   Title Pt will be independent with HEP 07/16/2020    Baseline HEP not issued yet    Time 4    Period Weeks    Status On-going    Target Date 07/16/20      OT SHORT TERM GOAL #2   Title Pt will perform UB dressing with min A and implementing hemitechniques and adapted strategies PRN in order to increase independence with ADLs    Baseline max A for UB dressing    Time 4    Period Weeks    Status On-going      OT SHORT TERM GOAL #3  Title Pt will perform bathing with mod A and implementing adapted strategies PRN in order to increase independence with ADLs    Baseline total A currently for all bathing    Time 4    Period Weeks    Status New      OT SHORT TERM GOAL #4   Title Patient will demonstrate safe positioning and handling for LUE during transfers with min verbal cues 4/5 trials in order to decrease pain and risk of injury.    Baseline currently severe left inattention to LUE    Time 4    Period Weeks    Status On-going      OT SHORT TERM GOAL #5   Title Pt will complete table top scanning with 50 % accuracy in order to increase awareness to left hemisphere.    Baseline 29/121 or approx 24%    Time 4    Period Weeks    Status New      OT SHORT TERM GOAL #6   Title Patient will prepare a light snack with mod A and adapted strategies PRN in order to increase independence with IADLs.    Baseline total A for snack prep    Time 4    Period Weeks    Status New             OT Long Term Goals - 06/18/20 1545      OT LONG TERM GOAL #1    Title Pt will be independent with updated HEP 08/13/20    Baseline not implemented .    Time 12    Period Weeks    Status New    Target Date 08/13/20      OT LONG TERM GOAL #2   Title Pt will perform UB dressing with supervision and implementing hemitechniques and adapted strategies PRN in order to increase independence with ADLs    Baseline max A for UB dressing    Time 12    Period Weeks    Status New      OT LONG TERM GOAL #3   Title Pt will perform bathing with min A and implementing adapted strategies PRN in order to increase independence with ADLs    Baseline total A for bathing at evaluation    Time 12    Period Weeks    Status New      OT LONG TERM GOAL #4   Title Patient will demonstrate safe positioning and handling for LUE during transfers Independently (with no verbal cues) 4/5 trials in order to decrease pain and risk of injury.    Baseline currently left inattention to LUE    Time 12    Period Weeks    Status New      OT LONG TERM GOAL #5   Title Pt will complete table top scanning with 75% accuracy in order to increase awareness to left hemisphere.    Baseline 24% accuracy -29/121 double digit cancellation    Time 12    Period Weeks    Status New      OT LONG TERM GOAL #6   Title Patient will prepare a light snack with min A and adapted strategies PRN in order to increase independence with IADLs.    Baseline not preparing snacks    Time 12    Period Weeks    Status New      OT LONG TERM GOAL #7   Title Pt will utilize LUE as a stabilizer with no  more than 2 verbal cues in order to increase independence and use of LUE.    Time 12    Period Weeks    Status New                 Plan - 07/13/20 0934    Clinical Impression Statement Little activation present in LUE this day. Pt continues to demonstrate poor attention to LUE and benefits from skilled OT to develop strategies for care and protection strategies for LUE to decrease risk of injury.    OT  Occupational Profile and History Detailed Assessment- Review of Records and additional review of physical, cognitive, psychosocial history related to current functional performance    Occupational performance deficits (Please refer to evaluation for details): IADL's;ADL's;Rest and Sleep;Education    Body Structure / Function / Physical Skills ADL;Strength;Gait;Decreased knowledge of use of DME;Balance;Pain;UE functional use;GMC;Dexterity;Endurance;IADL;ROM;Vision;Coordination;Flexibility;Mobility;Sensation;FMC;Decreased knowledge of precautions    Cognitive Skills Attention;Safety Awareness;Problem Solve;Understand;Sequencing    Rehab Potential Good    Clinical Decision Making Several treatment options, min-mod task modification necessary    Comorbidities Affecting Occupational Performance: May have comorbidities impacting occupational performance    Modification or Assistance to Complete Evaluation  Min-Moderate modification of tasks or assist with assess necessary to complete eval    OT Frequency 2x / week    OT Duration 12 weeks    OT Treatment/Interventions Self-care/ADL training;Moist Heat;DME and/or AE instruction;Splinting;Balance training;Fluidtherapy;Gait Training;Therapeutic activities;Therapeutic exercise;Ultrasound;Electrical Stimulation;Energy conservation;Manual Therapy;Patient/family education;Visual/perceptual remediation/compensation;Passive range of motion;Functional Mobility Training;Neuromuscular education    Plan review UB dressing with hemi techniques, NMR LUE    Consulted and Agree with Plan of Care Patient;Family member/caregiver    Family Member Consulted Aunt           Patient will benefit from skilled therapeutic intervention in order to improve the following deficits and impairments:   Body Structure / Function / Physical Skills: ADL, Strength, Gait, Decreased knowledge of use of DME, Balance, Pain, UE functional use, GMC, Dexterity, Endurance, IADL, ROM, Vision,  Coordination, Flexibility, Mobility, Sensation, FMC, Decreased knowledge of precautions Cognitive Skills: Attention, Safety Awareness, Problem Solve, Understand, Sequencing     Visit Diagnosis: Hemiplegia and hemiparesis following other cerebrovascular disease affecting left non-dominant side (HCC)  Muscle weakness (generalized)  Other abnormalities of gait and mobility  Other lack of coordination  Stiffness of left shoulder, not elsewhere classified  Acute pain of left shoulder    Problem List Patient Active Problem List   Diagnosis Date Noted   Uncontrolled type 2 diabetes mellitus with hyperglycemia, without long-term current use of insulin (HCC) 04/16/2020   CVA (cerebral vascular accident) (HCC) 04/15/2020   Cocaine use 04/15/2020   Acute CVA (cerebrovascular accident) (HCC) 03/04/2020   Stroke (cerebrum) (HCC) 03/03/2020   Weakness    Noncompliance with medication regimen    TIA (transient ischemic attack) 01/22/2019   Diabetes mellitus due to underlying condition without complications (HCC) 05/30/2014   Essential hypertension, benign 05/30/2014   Smoking 05/30/2014    Junious Dresser MOT, OTR/L  07/13/2020, 9:34 AM  [facility] [address] [place], [place], [address] Phone: [phone]   Fax:  [phone]  Name: Winslow Verrill MRN: [id number] Date of Birth: May 28, 1981

## 2020-07-16 ENCOUNTER — Other Ambulatory Visit: Payer: Self-pay

## 2020-07-16 ENCOUNTER — Ambulatory Visit: Payer: Medicaid Other

## 2020-07-16 DIAGNOSIS — I69854 Hemiplegia and hemiparesis following other cerebrovascular disease affecting left non-dominant side: Secondary | ICD-10-CM

## 2020-07-16 DIAGNOSIS — R2689 Other abnormalities of gait and mobility: Secondary | ICD-10-CM

## 2020-07-16 DIAGNOSIS — R2681 Unsteadiness on feet: Secondary | ICD-10-CM

## 2020-07-16 DIAGNOSIS — R296 Repeated falls: Secondary | ICD-10-CM

## 2020-07-16 DIAGNOSIS — M6281 Muscle weakness (generalized): Secondary | ICD-10-CM

## 2020-07-16 NOTE — Therapy (Signed)
[facility] [address] [place], [place], [address] Phone: [phone]   Fax:  [phone]  Physical Therapy Treatment  Patient Details  Name: Ebony Matthews MRN: [id number] Date of Birth: 01/25/1981 Referring Provider (PT): Referred by: Georgiann Cocker, MD. Followed by Shon Millet, MD   Encounter Date: 07/16/2020   PT End of Session - 07/16/20 1106    Visit Number 19    Number of Visits 28    Date for PT Re-Evaluation 10/04/20   POC for 6 weeks, Cert for 90 days   Authorization Type [employer]    PT Start Time 1102    PT Stop Time 1145    PT Time Calculation (min) 43 min    Equipment Utilized During Treatment Gait belt;Other (comment)    Activity Tolerance Patient tolerated treatment well    Behavior During Therapy [facility] for tasks assessed/performed;Flat affect           Past Medical History:  Diagnosis Date  . Diabetes mellitus without complication (HCC)   . Hypertension   . TIA (transient ischemic attack) 01/22/2019    Past Surgical History:  Procedure Laterality Date  . NO PAST SURGERIES      There were no vitals filed for this visit.   Subjective Assessment - 07/16/20 1105    Subjective No falls or changes since last visit. Patient reports no pain in ankle today.    Patient is accompained by: Family member    Pertinent History hx of multiple CVA, DM, HTN, drug abuse    Limitations Standing;Walking;House hold activities;Lifting    Currently in Pain? No/denies                             [facility] Adult PT Treatment/Exercise - 07/16/20 0001      Transfers   Transfers Sit to Stand;Stand to Sit;Stand Pivot Transfers    Sit to Stand 5: Supervision;4: Min guard    Sit to Stand Details Tactile cues for weight shifting;Tactile cues for posture;Tactile cues for placement;Verbal cues for safe use of DME/AE;Manual facilitation for weight shifting    Stand to Sit 5: Supervision;4: Min guard     Stand to Sit Details (indicate cue type and reason) Verbal cues for technique;Verbal cues for precautions/safety    Stand Pivot Transfers 5: Supervision;4: Min guard    Stand Pivot Transfer Details (indicate cue type and reason) transfer from w/c <> nustep seat. verbal cues for LLE placement      Ambulation/Gait   Ambulation/Gait Yes    Ambulation/Gait Assistance 4: Min guard;4: Min assist    Ambulation/Gait Assistance Details continued ambulation today with L posterior ottobok with wedge. Patient continues to demo improved in knee recurvatum, patient still continues to require tactile/verbal cues for improved control with LLE. More improvements noted with ambulation in area with decreased distractions as patient able to attend to LLE. Ambulation x 250. verbal cues to keep body and AD aligned as often keeps trunk/LE's turned to L.     Ambulation Distance (Feet) 250 Feet    Assistive device Hemi-walker    Gait Pattern Step-to pattern;Decreased arm swing - left;Decreased arm swing - right;Decreased step length - left;Decreased stance time - right;Decreased hip/knee flexion - right;Decreased hip/knee flexion - left;Decreased dorsiflexion - left;Decreased weight shift to left    Ambulation Surface Level;Indoor      Exercises   Exercises Other Exercises    Other Exercises  Completed Nustep x 5  minutes to promote improved muscle activation/strength, completed with BLE's only. PT providing tactile cueing and intermittent Min A to keep LLE aligned properly with completion. max vebral cues to attend to task.                     PT Short Term Goals - 07/06/20 1332      PT SHORT TERM GOAL #1   Title Patient and caregiver will report walking daily in home for improved household mobility (ALL STGs Due: 07/27/2020)    Baseline not currently walking daily    Time 3    Period Weeks    Status New    Target Date 07/27/20      PT SHORT TERM GOAL #2   Title Patient will improve TUG to <1 min to  demonstrate improved functional mobility    Baseline 1 min, 32 secs    Time 3    Period Weeks    Status New      PT SHORT TERM GOAL #3   Title Patient will improve gait speed to > 1.0 ft sec to demonstrate improved household mobility    Baseline not yet assessed    Time 3    Period Weeks    Status New             PT Long Term Goals - 07/06/20 1335      PT LONG TERM GOAL #1   Title Patient will report independence with progressive HEP with caregiver asssistance (ALL LTG's Due: 08/17/20)    Baseline continue to progress HEP    Time 6    Period Weeks    Status New    Target Date 08/17/20      PT LONG TERM GOAL #2   Title Patient will demo ability to ambulate >250 ft with LRAD and CGA to demo improved functional mobility and ambulation within the home    Baseline 14 ft, Min A; 207' with CGA and hemi walker    Time 6    Period Weeks    Status New      PT LONG TERM GOAL #3   Title Patient will demo ability to complete TUG  < 50 seconds to demo improved ambulation and mobility    Baseline 1 min 32 secs    Time 6    Period Weeks    Status Revised      PT LONG TERM GOAL #4   Title Patient will improve gait speed to >/= 1.5 ft/sec to demonstrate improved mobility    Baseline not yet assessed    Time 6    Period Weeks    Status New                 Plan - 07/16/20 1204    Clinical Impression Statement Continued extensive gait trianing x 250 ft today with hemiwalker and L AFO donned. continue to demo improvements in knee recurvatum, but requires increased attention to LLE to avoid. Increased recurvatum noted with ambulation in crowded space. Continued Nu-Step to promote mucle activation/strength in LLE. Will continue to progress toward all goals.    Personal Factors and Comorbidities Comorbidity 3+;Past/Current Experience;Time since onset of injury/illness/exacerbation;Transportation;Behavior Pattern    Comorbidities DM, HTN, hx of multiple strokes in last 6 months     Examination-Activity Limitations Bathing;Bed Mobility;Caring for Others;Carry;Dressing;Hygiene/Grooming;Lift;Reach Overhead;Squat    Examination-Participation Restrictions Cleaning;Community Activity;Driving;Laundry;Yard Work;Occupation;Church;Meal Prep    Stability/Clinical Decision Making Evolving/Moderate complexity    Rehab Potential Good  PT Frequency 2x / week    PT Duration 6 weeks    PT Treatment/Interventions ADLs/Self Care Home Management;Gait training;Stair training;Functional mobility training;Therapeutic activities;Therapeutic exercise;Balance training;Neuromuscular re-education;Manual techniques;Orthotic Fit/Training;Patient/family education;Cognitive remediation;Passive range of motion;Energy conservation;DME Instruction;Electrical Stimulation;Joint Manipulations    PT Next Visit Plan Continue to work on transfers. Activites to promote quad actvitiation. Continue gait training with hemiwalker    PT Home Exercise Plan seated hip march, bridge, transfer training (bed, commode), PROM L ankle    Consulted and Agree with Plan of Care Patient;Family member/caregiver    Family Member Consulted Aunt           Patient will benefit from skilled therapeutic intervention in order to improve the following deficits and impairments:  Abnormal gait, Decreased activity tolerance, Decreased balance, Decreased cognition, Decreased coordination, Decreased endurance, Decreased knowledge of precautions, Decreased mobility, Decreased range of motion, Decreased safety awareness, Decreased strength, Difficulty walking, Impaired perceived functional ability, Impaired flexibility, Impaired sensation, Impaired tone, Impaired UE functional use, Postural dysfunction, Pain  Visit Diagnosis: Hemiplegia and hemiparesis following other cerebrovascular disease affecting left non-dominant side (HCC)  Muscle weakness (generalized)  Other abnormalities of gait and mobility  Unsteadiness on feet  Repeated  falls     Problem List Patient Active Problem List   Diagnosis Date Noted  . Uncontrolled type 2 diabetes mellitus with hyperglycemia, without long-term current use of insulin (HCC) 04/16/2020  . CVA (cerebral vascular accident) (HCC) 04/15/2020  . Cocaine use 04/15/2020  . Acute CVA (cerebrovascular accident) (HCC) 03/04/2020  . Stroke (cerebrum) (HCC) 03/03/2020  . Weakness   . Noncompliance with medication regimen   . TIA (transient ischemic attack) 01/22/2019  . Diabetes mellitus due to underlying condition without complications (HCC) 05/30/2014  . Essential hypertension, benign 05/30/2014  . Smoking 05/30/2014    Tempie Donning, PT, DPT 07/16/2020, 12:08 PM  [facility] [address] [place], [place], [address] Phone: [phone]   Fax:  [phone]  Name: Ebony Matthews MRN: [id number] Date of Birth: 1981/05/03

## 2020-07-17 ENCOUNTER — Encounter: Payer: Medicaid Other | Attending: Family Medicine | Admitting: Dietician

## 2020-07-17 ENCOUNTER — Encounter: Payer: Self-pay | Admitting: Dietician

## 2020-07-17 ENCOUNTER — Other Ambulatory Visit: Payer: Self-pay

## 2020-07-17 DIAGNOSIS — E089 Diabetes mellitus due to underlying condition without complications: Secondary | ICD-10-CM | POA: Insufficient documentation

## 2020-07-17 NOTE — Progress Notes (Signed)
Diabetes Self-Management Education  Visit Type: First/Initial  Appt. Start Time: 1440 Appt. End Time: 1540  07/17/2020  [name], identified by name and date of birth, is a 39 y.o. female with a diagnosis of Diabetes: Type 2.   ASSESSMENT Patient is here today with her Aunts (betty and Ebony Matthews).   She would like to learn what to eat and avoid.  History includes Type 2 Diabetes, HTN, HLD, and CVA in July.  Vitamin D deficiency. She is in a wheel chair and continues to go to therapy. A1C 10.7% 03/04/2020 increased from 9.9% 12/2018 Medications include:  Glipizide, Jardiance, Rybelsus  She lives with her Ebony Matthews.   She worked for [employer] prior to the stroke.  Height 5\' 7"  (1.702 m), weight 188 lb (85.3 kg). Body mass index is 29.44 kg/m.   Diabetes Self-Management Education - 07/17/20 1453      Visit Information   Visit Type First/Initial      Initial Visit   Diabetes Type Type 2    Are you currently following a meal plan? No    Are you taking your medications as prescribed? Yes    Date Diagnosed 2012      Health Coping   How would you rate your overall health? Fair      Psychosocial Assessment   Patient Belief/Attitude about Diabetes Motivated to manage diabetes    Self-care barriers Debilitated state due to current medical condition    Self-management support Doctor's office;Family    Other persons present Patient;Family Member    Patient Concerns Nutrition/Meal planning;Glycemic Control;Weight Control    Special Needs None    Preferred Learning Style No preference indicated    Learning Readiness Ready    How often do you need to have someone help you when you read instructions, pamphlets, or other written materials from your doctor or pharmacy? 1 - Never    What is the last grade level you completed in school? GED      Pre-Education Assessment   Patient understands the diabetes disease and treatment process. Needs Instruction    Patient understands  incorporating nutritional management into lifestyle. Needs Instruction    Patient undertands incorporating physical activity into lifestyle. Needs Instruction    Patient understands using medications safely. Needs Instruction    Patient understands monitoring blood glucose, interpreting and using results Needs Instruction    Patient understands prevention, detection, and treatment of acute complications. Needs Instruction    Patient understands prevention, detection, and treatment of chronic complications. Needs Instruction    Patient understands how to develop strategies to address psychosocial issues. Needs Instruction    Patient understands how to develop strategies to promote health/change behavior. Needs Instruction      Complications   Last HgB A1C per patient/outside source 10.7 %   03/04/2020 increased from 9.9% 01/23/2019   How often do you check your blood sugar? 1-2 times/day   2-3 times daily   Fasting Blood glucose range (mg/dL) 01/25/2019   [id number];631-497   Postprandial Blood glucose range (mg/dL) 026-378    Number of hypoglycemic episodes per month 0    Number of hyperglycemic episodes per week 7    Can you tell when your blood sugar is high? No    Have you had a dilated eye exam in the past 12 months? Yes    Have you had a dental exam in the past 12 months? No    Are you checking your feet? Yes    How many  days per week are you checking your feet? 7      Dietary Intake   Breakfast Honey Nut Cheerios, whole milk OR [employer] OR instant oatmeal    Snack (morning) none    Lunch [employer] (afternoon) none    Dinner [employer] chicken, mashed potatoes, green beans    Snack (evening) none    Beverage(s) water, lemon water, whole milk      Exercise   Exercise Type Light (walking / raking leaves)   Physical Therapy   How many days per week to you exercise? 2    How many minutes per day do you exercise? 45    Total minutes per week of exercise 90       Patient Education   Previous Diabetes Education No    Disease state  Definition of diabetes, type 1 and 2, and the diagnosis of diabetes    Nutrition management  Role of diet in the treatment of diabetes and the relationship between the three main macronutrients and blood glucose level;Food label reading, portion sizes and measuring food.;Meal options for control of blood glucose level and chronic complications.;Information on hints to eating out and maintain blood glucose control.    Physical activity and exercise  Role of exercise on diabetes management, blood pressure control and cardiac health.;Other (comment)   discussed limitation and directed to PT advice   Medications Reviewed patients medication for diabetes, action, purpose, timing of dose and side effects.    Monitoring Taught/discussed recording of test results and interpretation of SMBG.;Identified appropriate SMBG and/or A1C goals.;Daily foot exams;Yearly dilated eye exam    Acute complications Taught treatment of hypoglycemia - the 15 rule.;Discussed and identified patients' treatment of hyperglycemia.    Chronic complications Retinopathy and reason for yearly dilated eye exams;Relationship between chronic complications and blood glucose control    Psychosocial adjustment Worked with patient to identify barriers to care and solutions    Personal strategies to promote health Other (comment)   congratulated on quitting smoking     Individualized Goals (developed by patient)   Nutrition Follow meal plan discussed;General guidelines for healthy choices and portions discussed    Physical Activity Not Applicable    Medications take my medication as prescribed    Monitoring  test my blood glucose as discussed    Reducing Risk increase portions of healthy fats;examine blood glucose patterns    Health Coping discuss diabetes with (comment)   MD, RD, CDCES     Post-Education Assessment   Patient understands the diabetes disease and  treatment process. Demonstrates understanding / competency    Patient understands incorporating nutritional management into lifestyle. Needs Review    Patient undertands incorporating physical activity into lifestyle. Demonstrates understanding / competency    Patient understands using medications safely. Demonstrates understanding / competency    Patient understands monitoring blood glucose, interpreting and using results Demonstrates understanding / competency    Patient understands prevention, detection, and treatment of acute complications. Demonstrates understanding / competency    Patient understands prevention, detection, and treatment of chronic complications. Demonstrates understanding / competency    Patient understands how to develop strategies to address psychosocial issues. Needs Review    Patient understands how to develop strategies to promote health/change behavior. Needs Review      Outcomes   Expected Outcomes Demonstrated interest in learning. Expect positive outcomes    Future DMSE 3-4 months    Program Status Not Completed  Individualized Plan for Diabetes Self-Management Training:   Learning Objective:  Patient will have a greater understanding of diabetes self-management. Patient education plan is to attend individual and/or group sessions per assessed needs and concerns.   Plan:   Patient Instructions  Plan: Recommend 2,000 units of Vitamin D3 daily. Aim for 2-3 Carb Choices per meal (30-45 grams) +/- 1 either way  Aim for 0-1 Carbs per snack if hungry  Include protein in moderation with your meals and snacks Consider reading food labels for Total Carbohydrate of foods Continue checking BG at alternate times per day  Continue taking medication as directed by MD      Expected Outcomes:  Demonstrated interest in learning. Expect positive outcomes  Education material provided: ADA - How to Thrive: A Guide for Your Journey with Diabetes, Food  label handouts, Meal plan card and My Plate  If problems or questions, patient to contact team via:  Phone  Future DSME appointment: 3-4 months

## 2020-07-17 NOTE — Patient Instructions (Addendum)
Plan: Recommend 2,000 units of Vitamin D3 daily. Aim for 2-3 Carb Choices per meal (30-45 grams) +/- 1 either way  Aim for 0-1 Carbs per snack if hungry  Include protein in moderation with your meals and snacks Consider reading food labels for Total Carbohydrate of foods Continue checking BG at alternate times per day  Continue taking medication as directed by MD

## 2020-07-18 ENCOUNTER — Ambulatory Visit: Payer: Medicaid Other | Admitting: Occupational Therapy

## 2020-07-18 ENCOUNTER — Ambulatory Visit: Payer: Medicaid Other

## 2020-07-18 DIAGNOSIS — I69854 Hemiplegia and hemiparesis following other cerebrovascular disease affecting left non-dominant side: Secondary | ICD-10-CM

## 2020-07-18 DIAGNOSIS — M6281 Muscle weakness (generalized): Secondary | ICD-10-CM

## 2020-07-18 DIAGNOSIS — R2681 Unsteadiness on feet: Secondary | ICD-10-CM

## 2020-07-18 DIAGNOSIS — R296 Repeated falls: Secondary | ICD-10-CM

## 2020-07-18 DIAGNOSIS — R2689 Other abnormalities of gait and mobility: Secondary | ICD-10-CM

## 2020-07-18 NOTE — Therapy (Signed)
[facility] [address] [place], [place], [address] Phone: [phone]   Fax:  [phone]  Occupational Therapy Treatment  Patient Details  Name: Ebony Matthews MRN: [id number] Date of Birth: 02-Sep-1981 Referring Provider (OT): Dr. Everlena Cooper   Encounter Date: 07/18/2020   OT End of Session - 07/18/20 1052    Visit Number 4    Number of Visits 25    Date for OT Re-Evaluation 09/10/20    Authorization Type [employer]    Authorization Time Period VL:MN - auth required after 25th visit    Authorization - Visit Number 4    Authorization - Number of Visits 10    Progress Note Due on Visit 10    OT Start Time 0845    OT Stop Time 0930    OT Time Calculation (min) 45 min    Equipment Utilized During Treatment gait belt    Activity Tolerance Patient tolerated treatment well    Behavior During Therapy [facility] for tasks assessed/performed;Flat affect           Past Medical History:  Diagnosis Date   Diabetes mellitus without complication (HCC)    Hypertension    TIA (transient ischemic attack) 01/22/2019    Past Surgical History:  Procedure Laterality Date   NO PAST SURGERIES      There were no vitals filed for this visit.   Subjective Assessment - 07/18/20 0849    Subjective  Pt denies any pain.    Patient is accompanied by: Family member    Pertinent History PMH of CVA 2020, CVA June 2021 (R cerebellar) HTN, IIDM, HLD, on going cocaine/polysubstance abuse, non-compliant with medications and presented with new onset of left arm weakness and numbness.    Limitations Fall Risk. Not Driving. Left Inattention    Patient Stated Goals "to be able to move my arms" and "to be able to hold on and grip on stuff" with LUE    Currently in Pain? No/denies           Verbally reviewed hemi dressing techniques for donning/doffing overhead shirt with patient/aunt, however did not practice.  Pt did practice  donning/doffing jacket with hemi dressing techniques - able to do with min assist.  Wt bearing over BUE's for sit to squat position with support from therapist to LUE/hand, both pushing off mat and then wt bearing over chair (w/ chair placed in front of patient). Progressed to wt bearing over LUE with cross reaching and diagonal reaching patterns RUE w/ mod facilitation LUE to activate triceps (using cones).  Closed chain AA/ROM in low range shoulder flex/ext using tilted stool with mod assist/facilitation provided to LUE                       OT Short Term Goals - 07/13/20 0928      OT SHORT TERM GOAL #1   Title Pt will be independent with HEP 07/16/2020    Baseline HEP not issued yet    Time 4    Period Weeks    Status On-going    Target Date 07/16/20      OT SHORT TERM GOAL #2   Title Pt will perform UB dressing with min A and implementing hemitechniques and adapted strategies PRN in order to increase independence with ADLs    Baseline max A for UB dressing    Time 4    Period Weeks    Status On-going  OT SHORT TERM GOAL #3   Title Pt will perform bathing with mod A and implementing adapted strategies PRN in order to increase independence with ADLs    Baseline total A currently for all bathing    Time 4    Period Weeks    Status New      OT SHORT TERM GOAL #4   Title Patient will demonstrate safe positioning and handling for LUE during transfers with min verbal cues 4/5 trials in order to decrease pain and risk of injury.    Baseline currently severe left inattention to LUE    Time 4    Period Weeks    Status On-going      OT SHORT TERM GOAL #5   Title Pt will complete table top scanning with 50 % accuracy in order to increase awareness to left hemisphere.    Baseline 29/121 or approx 24%    Time 4    Period Weeks    Status New      OT SHORT TERM GOAL #6   Title Patient will prepare a light snack with mod A and adapted strategies PRN in order to  increase independence with IADLs.    Baseline total A for snack prep    Time 4    Period Weeks    Status New             OT Long Term Goals - 06/18/20 1545      OT LONG TERM GOAL #1   Title Pt will be independent with updated HEP 08/13/20    Baseline not implemented .    Time 12    Period Weeks    Status New    Target Date 08/13/20      OT LONG TERM GOAL #2   Title Pt will perform UB dressing with supervision and implementing hemitechniques and adapted strategies PRN in order to increase independence with ADLs    Baseline max A for UB dressing    Time 12    Period Weeks    Status New      OT LONG TERM GOAL #3   Title Pt will perform bathing with min A and implementing adapted strategies PRN in order to increase independence with ADLs    Baseline total A for bathing at evaluation    Time 12    Period Weeks    Status New      OT LONG TERM GOAL #4   Title Patient will demonstrate safe positioning and handling for LUE during transfers Independently (with no verbal cues) 4/5 trials in order to decrease pain and risk of injury.    Baseline currently left inattention to LUE    Time 12    Period Weeks    Status New      OT LONG TERM GOAL #5   Title Pt will complete table top scanning with 75% accuracy in order to increase awareness to left hemisphere.    Baseline 24% accuracy -29/121 double digit cancellation    Time 12    Period Weeks    Status New      OT LONG TERM GOAL #6   Title Patient will prepare a light snack with min A and adapted strategies PRN in order to increase independence with IADLs.    Baseline not preparing snacks    Time 12    Period Weeks    Status New      OT LONG TERM GOAL #7   Title Pt will  utilize LUE as a stabilizer with no more than 2 verbal cues in order to increase independence and use of LUE.    Time 12    Period Weeks    Status New                 Plan - 07/18/20 1053    Clinical Impression Statement Slightly more  activation present in LUE this day in closed chain activities. Pt continues to demonstrate poor attention to LUE and benefits from skilled OT to develop strategies for care and protection strategies for LUE to decrease risk of injury. Pt with somewhat flat affect and hard to engage    OT Occupational Profile and History Detailed Assessment- Review of Records and additional review of physical, cognitive, psychosocial history related to current functional performance    Occupational performance deficits (Please refer to evaluation for details): IADL's;ADL's;Rest and Sleep;Education    Body Structure / Function / Physical Skills ADL;Strength;Gait;Decreased knowledge of use of DME;Balance;Pain;UE functional use;GMC;Dexterity;Endurance;IADL;ROM;Vision;Coordination;Flexibility;Mobility;Sensation;FMC;Decreased knowledge of precautions    Cognitive Skills Attention;Safety Awareness;Problem Solve;Understand;Sequencing    Rehab Potential Good    Clinical Decision Making Several treatment options, min-mod task modification necessary    Comorbidities Affecting Occupational Performance: May have comorbidities impacting occupational performance    Modification or Assistance to Complete Evaluation  Min-Moderate modification of tasks or assist with assess necessary to complete eval    OT Frequency 2x / week    OT Duration 12 weeks    OT Treatment/Interventions Self-care/ADL training;Moist Heat;DME and/or AE instruction;Splinting;Balance training;Fluidtherapy;Gait Training;Therapeutic activities;Therapeutic exercise;Ultrasound;Electrical Stimulation;Energy conservation;Manual Therapy;Patient/family education;Visual/perceptual remediation/compensation;Passive range of motion;Functional Mobility Training;Neuromuscular education    Plan practice donning/doffing overhead shirt, continue NMR LUE/trunk    Consulted and Agree with Plan of Care Patient;Family member/caregiver    Family Member Consulted Aunt            Patient will benefit from skilled therapeutic intervention in order to improve the following deficits and impairments:   Body Structure / Function / Physical Skills: ADL, Strength, Gait, Decreased knowledge of use of DME, Balance, Pain, UE functional use, GMC, Dexterity, Endurance, IADL, ROM, Vision, Coordination, Flexibility, Mobility, Sensation, FMC, Decreased knowledge of precautions Cognitive Skills: Attention, Safety Awareness, Problem Solve, Understand, Sequencing     Visit Diagnosis: Hemiplegia and hemiparesis following other cerebrovascular disease affecting left non-dominant side (HCC)  Muscle weakness (generalized)    Problem List Patient Active Problem List   Diagnosis Date Noted   Uncontrolled type 2 diabetes mellitus with hyperglycemia, without long-term current use of insulin (HCC) 04/16/2020   CVA (cerebral vascular accident) (HCC) 04/15/2020   Cocaine use 04/15/2020   Acute CVA (cerebrovascular accident) (HCC) 03/04/2020   Stroke (cerebrum) (HCC) 03/03/2020   Weakness    Noncompliance with medication regimen    TIA (transient ischemic attack) 01/22/2019   Diabetes mellitus due to underlying condition without complications (HCC) 05/30/2014   Essential hypertension, benign 05/30/2014   Smoking 05/30/2014    Kelli Churn, OTR/L 07/18/2020, 10:55 AM  [facility] [address] [place], [place], [address] Phone: [phone]   Fax:  [phone]  Name: Jael Waldorf MRN: [id number] Date of Birth: [birth year]

## 2020-07-18 NOTE — Therapy (Signed)
[facility] [address] [place], [place], [address] Phone: [phone]   Fax:  [phone]  Physical Therapy Treatment  Patient Details  Name: Ebony Matthews MRN: [id number] Date of Birth: 11/14/1980 Referring Provider (PT): Referred by: Georgiann Cocker, MD. Followed by Shon Millet, MD   Encounter Date: 07/18/2020   PT End of Session - 07/18/20 0935    Visit Number 20    Number of Visits 28    Date for PT Re-Evaluation 10/04/20   POC for 6 weeks, Cert for 90 days   Authorization Type [employer]    PT Start Time 0932    PT Stop Time 1014    PT Time Calculation (min) 42 min    Equipment Utilized During Treatment Gait belt;Other (comment)    Activity Tolerance Patient tolerated treatment well    Behavior During Therapy [facility] for tasks assessed/performed;Flat affect           Past Medical History:  Diagnosis Date   Diabetes mellitus without complication (HCC)    Hypertension    TIA (transient ischemic attack) 01/22/2019    Past Surgical History:  Procedure Laterality Date   NO PAST SURGERIES      There were no vitals filed for this visit.   Subjective Assessment - 07/18/20 0934    Subjective Patient reports no new changes/complaints since last visit. No falls or pain to report today.    Patient is accompained by: Family member    Pertinent History hx of multiple CVA, DM, HTN, drug abuse    Limitations Standing;Walking;House hold activities;Lifting    Currently in Pain? No/denies               [facility] Adult PT Treatment/Exercise - 07/18/20 0953      Transfers   Transfers Sit to Stand;Stand to Sit;Stand Pivot Transfers    Sit to Stand 5: Supervision;4: Min guard    Sit to Stand Details Tactile cues for weight shifting;Tactile cues for posture;Tactile cues for placement;Verbal cues for safe use of DME/AE;Manual facilitation for weight shifting    Stand to Sit 5: Supervision;4: Min guard    Stand to Sit  Details (indicate cue type and reason) Verbal cues for technique;Verbal cues for precautions/safety    Stand Pivot Transfers 5: Supervision;4: Min guard    Stand Pivot Transfer Details (indicate cue type and reason) transfer from mat <> w/c, verbal cues for foot placement.       Ambulation/Gait   Ambulation/Gait Yes    Ambulation/Gait Assistance 4: Min guard;4: Min assist    Ambulation/Gait Assistance Details PT donning/doffing L AFO with heel wedge. Completed ambulation x 145 ft with CGA to Min A required to prevent knee recurvatum at times. Patient require verbal cues as well to keep hip/feet aligned properly    Ambulation Distance (Feet) 145 Feet    Assistive device Hemi-walker    Gait Pattern Step-to pattern;Decreased arm swing - left;Decreased arm swing - right;Decreased step length - left;Decreased stance time - right;Decreased hip/knee flexion - right;Decreased hip/knee flexion - left;Decreased dorsiflexion - left;Decreased weight shift to left    Ambulation Surface Level;Indoor      Neuro Re-ed    Neuro Re-ed Details  Standing with single UE support on hemiwalker on R, with mirror placed in front for visual feedback completed standing activity. PT providing faciliation at pelvis for improved weight shift to L side with neutral standing and verbal cues for posture. Progressed to completing forward steps with RLE to colored target  to promote weight shift to LLE, completed x 15 reps. Increased verbal/tactile cues to avoid knee recurvatum. Intermittent breaks required to reset and obtain neutral alignment. Progressed to completing steps forward with LLE to target x 15 reps focus on control hip flexion. tactile/verbal cues required to avoid hip abduction at times.       Exercises   Exercises Other Exercises    Other Exercises  Completed seated with LLE placed on foam roll, completed hamstring curls 2 x 8 reps. Initially require CGA for completion but with fatigue of muscles noted require  intermittent Min A for proper completion.                     PT Short Term Goals - 07/06/20 1332      PT SHORT TERM GOAL #1   Title Patient and caregiver will report walking daily in home for improved household mobility (ALL STGs Due: 07/27/2020)    Baseline not currently walking daily    Time 3    Period Weeks    Status New    Target Date 07/27/20      PT SHORT TERM GOAL #2   Title Patient will improve TUG to <1 min to demonstrate improved functional mobility    Baseline 1 min, 32 secs    Time 3    Period Weeks    Status New      PT SHORT TERM GOAL #3   Title Patient will improve gait speed to > 1.0 ft sec to demonstrate improved household mobility    Baseline not yet assessed    Time 3    Period Weeks    Status New             PT Long Term Goals - 07/06/20 1335      PT LONG TERM GOAL #1   Title Patient will report independence with progressive HEP with caregiver asssistance (ALL LTG's Due: 08/17/20)    Baseline continue to progress HEP    Time 6    Period Weeks    Status New    Target Date 08/17/20      PT LONG TERM GOAL #2   Title Patient will demo ability to ambulate >250 ft with LRAD and CGA to demo improved functional mobility and ambulation within the home    Baseline 14 ft, Min A; 207' with CGA and hemi walker    Time 6    Period Weeks    Status New      PT LONG TERM GOAL #3   Title Patient will demo ability to complete TUG  < 50 seconds to demo improved ambulation and mobility    Baseline 1 min 32 secs    Time 6    Period Weeks    Status Revised      PT LONG TERM GOAL #4   Title Patient will improve gait speed to >/= 1.5 ft/sec to demonstrate improved mobility    Baseline not yet assessed    Time 6    Period Weeks    Status New                 Plan - 07/18/20 1018    Clinical Impression Statement Continud gait training and NMR activities with L AFO donned. Continue to promote improved weight shift/acceptance to LLE and  working on improved muscular control to continue to promote improved gait pattern and reduced knee recurvatum. patient continues to require CGA/Min A at this time. Will  continue to progress toward all goals.    Personal Factors and Comorbidities Comorbidity 3+;Past/Current Experience;Time since onset of injury/illness/exacerbation;Transportation;Behavior Pattern    Comorbidities DM, HTN, hx of multiple strokes in last 6 months    Examination-Activity Limitations Bathing;Bed Mobility;Caring for Others;Carry;Dressing;Hygiene/Grooming;Lift;Reach Overhead;Squat    Examination-Participation Restrictions Cleaning;Community Activity;Driving;Laundry;Yard Work;Occupation;Church;Meal Prep    Stability/Clinical Decision Making Evolving/Moderate complexity    Rehab Potential Good    PT Frequency 2x / week    PT Duration 6 weeks    PT Treatment/Interventions ADLs/Self Care Home Management;Gait training;Stair training;Functional mobility training;Therapeutic activities;Therapeutic exercise;Balance training;Neuromuscular re-education;Manual techniques;Orthotic Fit/Training;Patient/family education;Cognitive remediation;Passive range of motion;Energy conservation;DME Instruction;Electrical Stimulation;Joint Manipulations    PT Next Visit Plan Continue to work on gait with AFO. Update from hanger?  Activites to promote quad actvitiation. Continue gait training with hemiwalker    PT Home Exercise Plan seated hip march, bridge, transfer training (bed, commode), PROM L ankle    Consulted and Agree with Plan of Care Patient;Family member/caregiver    Family Member Consulted Aunt           Patient will benefit from skilled therapeutic intervention in order to improve the following deficits and impairments:  Abnormal gait, Decreased activity tolerance, Decreased balance, Decreased cognition, Decreased coordination, Decreased endurance, Decreased knowledge of precautions, Decreased mobility, Decreased range of motion,  Decreased safety awareness, Decreased strength, Difficulty walking, Impaired perceived functional ability, Impaired flexibility, Impaired sensation, Impaired tone, Impaired UE functional use, Postural dysfunction, Pain  Visit Diagnosis: Hemiplegia and hemiparesis following other cerebrovascular disease affecting left non-dominant side (HCC)  Muscle weakness (generalized)  Other abnormalities of gait and mobility  Unsteadiness on feet  Repeated falls     Problem List Patient Active Problem List   Diagnosis Date Noted   Uncontrolled type 2 diabetes mellitus with hyperglycemia, without long-term current use of insulin (HCC) 04/16/2020   CVA (cerebral vascular accident) (HCC) 04/15/2020   Cocaine use 04/15/2020   Acute CVA (cerebrovascular accident) (HCC) 03/04/2020   Stroke (cerebrum) (HCC) 03/03/2020   Weakness    Noncompliance with medication regimen    TIA (transient ischemic attack) 01/22/2019   Diabetes mellitus due to underlying condition without complications (HCC) 05/30/2014   Essential hypertension, benign 05/30/2014   Smoking 05/30/2014    Tempie Donning, PT, DPT 07/18/2020, 10:21 AM  [facility] [address] [place], [place], [address] Phone: [phone]   Fax:  [phone]  Name: Yakelin Grenier MRN: [id number] Date of Birth: July 08, 1981

## 2020-07-19 ENCOUNTER — Ambulatory Visit: Payer: Commercial Managed Care - PPO

## 2020-07-20 ENCOUNTER — Ambulatory Visit: Payer: Medicaid Other

## 2020-07-20 ENCOUNTER — Telehealth: Payer: Self-pay

## 2020-07-20 ENCOUNTER — Encounter: Payer: Self-pay | Admitting: Occupational Therapy

## 2020-07-20 ENCOUNTER — Ambulatory Visit: Payer: Medicaid Other | Admitting: Occupational Therapy

## 2020-07-20 ENCOUNTER — Other Ambulatory Visit: Payer: Self-pay

## 2020-07-20 DIAGNOSIS — M25612 Stiffness of left shoulder, not elsewhere classified: Secondary | ICD-10-CM

## 2020-07-20 DIAGNOSIS — R278 Other lack of coordination: Secondary | ICD-10-CM

## 2020-07-20 DIAGNOSIS — I69854 Hemiplegia and hemiparesis following other cerebrovascular disease affecting left non-dominant side: Secondary | ICD-10-CM | POA: Diagnosis not present

## 2020-07-20 DIAGNOSIS — R2681 Unsteadiness on feet: Secondary | ICD-10-CM

## 2020-07-20 DIAGNOSIS — M25512 Pain in left shoulder: Secondary | ICD-10-CM

## 2020-07-20 DIAGNOSIS — R41840 Attention and concentration deficit: Secondary | ICD-10-CM

## 2020-07-20 DIAGNOSIS — M6281 Muscle weakness (generalized): Secondary | ICD-10-CM

## 2020-07-20 DIAGNOSIS — R2689 Other abnormalities of gait and mobility: Secondary | ICD-10-CM

## 2020-07-20 NOTE — Therapy (Signed)
[facility] [address] [place], [place], [address] Phone: [phone]   Fax:  [phone]  Occupational Therapy Treatment  Patient Details  Name: Ebony Matthews MRN: [id number] Date of Birth: [birth year] Referring Provider (OT): Dr. Everlena Cooper   Encounter Date: 07/20/2020   OT End of Session - 07/20/20 1023    Visit Number 5    Number of Visits 25    Date for OT Re-Evaluation 09/10/20    Authorization Type UHC    Authorization Time Period VL:MN - auth required after 25th visit    OT Start Time 1020    OT Stop Time 1059    OT Time Calculation (min) 39 min    Equipment Utilized During Treatment gait belt    Activity Tolerance Patient tolerated treatment well    Behavior During Therapy [facility] for tasks assessed/performed;Flat affect           Past Medical History:  Diagnosis Date  . Diabetes mellitus without complication (HCC)   . Hypertension   . TIA (transient ischemic attack) 01/22/2019    Past Surgical History:  Procedure Laterality Date  . NO PAST SURGERIES      There were no vitals filed for this visit.   Subjective Assessment - 07/20/20 1022    Subjective  Pt denies any pain. Pt with brighter affect upon arrival. Pt accompanied by her aunt's best friend to therapy.    Patient is accompanied by: Family member    Pertinent History PMH of CVA 2020, CVA June 2021 (R cerebellar) HTN, IIDM, HLD, on going cocaine/polysubstance abuse, non-compliant with medications and presented with new onset of left arm weakness and numbness.    Limitations Fall Risk. Not Driving. Left Inattention    Patient Stated Goals "to be able to move my arms" and "to be able to hold on and grip on stuff" with LUE    Currently in Pain? No/denies                        OT Treatments/Exercises (OP) - 07/20/20 1101      ADLs   UB Dressing don and doff UB long sleeve shirt with mod A     LB Dressing practicing donning  shorts and clothing management for toileting with mod A       Neurological Re-education Exercises   Other Weight-Bearing Exercises 1 seated with WB into LUE and diagonal reaching for trunk and hip weight shifts and WB in LUE while seated edge of mat.     Other Weight-Bearing Exercises 2 side lying on L side with PROM to LUE. Slight activation in shoulder flexion noted in LUE while side lying on left                    OT Short Term Goals - 07/20/20 1108      OT SHORT TERM GOAL #1   Title Pt will be independent with HEP 07/16/2020    Baseline HEP not issued yet    Time 4    Period Weeks    Status On-going    Target Date 07/16/20      OT SHORT TERM GOAL #2   Title Pt will perform UB dressing with min A and implementing hemitechniques and adapted strategies PRN in order to increase independence with ADLs    Baseline max A for UB dressing    Time 4    Period Weeks    Status On-going  OT SHORT TERM GOAL #3   Title Pt will perform bathing with mod A and implementing adapted strategies PRN in order to increase independence with ADLs    Baseline total A currently for all bathing    Time 4    Period Weeks    Status On-going   pt reports she is not doing it but simulated in clinic     OT SHORT TERM GOAL #4   Title Patient will demonstrate safe positioning and handling for LUE during transfers with min verbal cues 4/5 trials in order to decrease pain and risk of injury.    Baseline currently severe left inattention to LUE    Time 4    Period Weeks    Status On-going      OT SHORT TERM GOAL #5   Title Pt will complete table top scanning with 50 % accuracy in order to increase awareness to left hemisphere.    Baseline 29/121 or approx 24%    Time 4    Period Weeks    Status New      OT SHORT TERM GOAL #6   Title Patient will prepare a light snack with mod A and adapted strategies PRN in order to increase independence with IADLs.    Baseline total A for snack prep     Time 4    Period Weeks    Status New             OT Long Term Goals - 06/18/20 1545      OT LONG TERM GOAL #1   Title Pt will be independent with updated HEP 08/13/20    Baseline not implemented .    Time 12    Period Weeks    Status New    Target Date 08/13/20      OT LONG TERM GOAL #2   Title Pt will perform UB dressing with supervision and implementing hemitechniques and adapted strategies PRN in order to increase independence with ADLs    Baseline max A for UB dressing    Time 12    Period Weeks    Status New      OT LONG TERM GOAL #3   Title Pt will perform bathing with min A and implementing adapted strategies PRN in order to increase independence with ADLs    Baseline total A for bathing at evaluation    Time 12    Period Weeks    Status New      OT LONG TERM GOAL #4   Title Patient will demonstrate safe positioning and handling for LUE during transfers Independently (with no verbal cues) 4/5 trials in order to decrease pain and risk of injury.    Baseline currently left inattention to LUE    Time 12    Period Weeks    Status New      OT LONG TERM GOAL #5   Title Pt will complete table top scanning with 75% accuracy in order to increase awareness to left hemisphere.    Baseline 24% accuracy -29/121 double digit cancellation    Time 12    Period Weeks    Status New      OT LONG TERM GOAL #6   Title Patient will prepare a light snack with min A and adapted strategies PRN in order to increase independence with IADLs.    Baseline not preparing snacks    Time 12    Period Weeks    Status New  OT LONG TERM GOAL #7   Title Pt will utilize LUE as a stabilizer with no more than 2 verbal cues in order to increase independence and use of LUE.    Time 12    Period Weeks    Status New                 Plan - 07/20/20 1109    Clinical Impression Statement Slightly more activation present in LUE this day in gravity eliminated position with shoulder  flexion. Pt continues to demonstrate poor attention to LUE and benefits from skilled OT to develop strategies for care and protection strategies for LUE to decrease risk of injury. Pt with brighter affect this day    OT Occupational Profile and History Detailed Assessment- Review of Records and additional review of physical, cognitive, psychosocial history related to current functional performance    Occupational performance deficits (Please refer to evaluation for details): IADL's;ADL's;Rest and Sleep;Education    Body Structure / Function / Physical Skills ADL;Strength;Gait;Decreased knowledge of use of DME;Balance;Pain;UE functional use;GMC;Dexterity;Endurance;IADL;ROM;Vision;Coordination;Flexibility;Mobility;Sensation;FMC;Decreased knowledge of precautions    Cognitive Skills Attention;Safety Awareness;Problem Solve;Understand;Sequencing    Rehab Potential Good    Clinical Decision Making Several treatment options, min-mod task modification necessary    Comorbidities Affecting Occupational Performance: May have comorbidities impacting occupational performance    Modification or Assistance to Complete Evaluation  Min-Moderate modification of tasks or assist with assess necessary to complete eval    OT Frequency 2x / week    OT Duration 12 weeks    OT Treatment/Interventions Self-care/ADL training;Moist Heat;DME and/or AE instruction;Splinting;Balance training;Fluidtherapy;Gait Training;Therapeutic activities;Therapeutic exercise;Ultrasound;Electrical Stimulation;Energy conservation;Manual Therapy;Patient/family education;Visual/perceptual remediation/compensation;Passive range of motion;Functional Mobility Training;Neuromuscular education    Plan self-care and dressing techniques - continue, table top scanning, NMR LUE and trunk    Consulted and Agree with Plan of Care Patient;Family member/caregiver    Family Member Consulted Aunt           Patient will benefit from skilled therapeutic  intervention in order to improve the following deficits and impairments:   Body Structure / Function / Physical Skills: ADL, Strength, Gait, Decreased knowledge of use of DME, Balance, Pain, UE functional use, GMC, Dexterity, Endurance, IADL, ROM, Vision, Coordination, Flexibility, Mobility, Sensation, FMC, Decreased knowledge of precautions Cognitive Skills: Attention, Safety Awareness, Problem Solve, Understand, Sequencing     Visit Diagnosis: Hemiplegia and hemiparesis following other cerebrovascular disease affecting left non-dominant side (HCC)  Muscle weakness (generalized)  Other abnormalities of gait and mobility  Unsteadiness on feet  Other lack of coordination  Stiffness of left shoulder, not elsewhere classified  Acute pain of left shoulder  Attention and concentration deficit    Problem List Patient Active Problem List   Diagnosis Date Noted  . Uncontrolled type 2 diabetes mellitus with hyperglycemia, without long-term current use of insulin (HCC) 04/16/2020  . CVA (cerebral vascular accident) (HCC) 04/15/2020  . Cocaine use 04/15/2020  . Acute CVA (cerebrovascular accident) (HCC) 03/04/2020  . Stroke (cerebrum) (HCC) 03/03/2020  . Weakness   . Noncompliance with medication regimen   . TIA (transient ischemic attack) 01/22/2019  . Diabetes mellitus due to underlying condition without complications (HCC) 05/30/2014  . Essential hypertension, benign 05/30/2014  . Smoking 05/30/2014    Junious Dresser MOT, OTR/L  07/20/2020, 11:10 AM  [facility] [address] [place], [place], [address] Phone: [phone]   Fax:  [phone]  Name: Ebony Matthews MRN: [id number] Date of Birth: [birth year]

## 2020-07-20 NOTE — Telephone Encounter (Signed)
Please order a left AFO for Ms. Grantham

## 2020-07-20 NOTE — Telephone Encounter (Signed)
Left AFO ordered. Faxed to [employer].

## 2020-07-20 NOTE — Telephone Encounter (Signed)
Dr. Everlena Matthews, Ebony Matthews is being treated by physical therapy for Left Hemiparesis due to CVA.  Ebony Matthews will benefit from use of L AFO in order to improve safety with functional mobility.    If you agree, please submit request in EPIC under MD Order, Other Orders (list L AFO in comments) or fax to [facility] at [phone].   Thank you, Adelfa Koh, PT, DPT   [facility] [address] [place], [place]  [address] Phone:  [phone] Fax:  [phone]

## 2020-07-23 ENCOUNTER — Ambulatory Visit: Payer: Medicaid Other

## 2020-07-23 ENCOUNTER — Ambulatory Visit: Payer: Medicaid Other | Admitting: Occupational Therapy

## 2020-07-23 ENCOUNTER — Other Ambulatory Visit: Payer: Self-pay

## 2020-07-23 ENCOUNTER — Encounter: Payer: Self-pay | Admitting: Occupational Therapy

## 2020-07-23 DIAGNOSIS — R2681 Unsteadiness on feet: Secondary | ICD-10-CM

## 2020-07-23 DIAGNOSIS — I69854 Hemiplegia and hemiparesis following other cerebrovascular disease affecting left non-dominant side: Secondary | ICD-10-CM | POA: Diagnosis not present

## 2020-07-23 DIAGNOSIS — M6281 Muscle weakness (generalized): Secondary | ICD-10-CM

## 2020-07-23 DIAGNOSIS — R2689 Other abnormalities of gait and mobility: Secondary | ICD-10-CM

## 2020-07-23 DIAGNOSIS — R278 Other lack of coordination: Secondary | ICD-10-CM

## 2020-07-23 DIAGNOSIS — M25612 Stiffness of left shoulder, not elsewhere classified: Secondary | ICD-10-CM

## 2020-07-23 DIAGNOSIS — R296 Repeated falls: Secondary | ICD-10-CM

## 2020-07-23 DIAGNOSIS — R41840 Attention and concentration deficit: Secondary | ICD-10-CM

## 2020-07-23 NOTE — Therapy (Signed)
[facility] [address] [place], [place], [address] Phone: [phone]   Fax:  [phone]  Physical Therapy Treatment  Patient Details  Name: Ebony Matthews MRN: [id number] Date of Birth: [birth year] Referring Provider (PT): Referred by: Georgiann Cocker, MD. Followed by Shon Millet, MD   Encounter Date: 07/23/2020   PT End of Session - 07/23/20 1109    Visit Number 21    Number of Visits 28    Date for PT Re-Evaluation 10/04/20   POC for 6 weeks, Cert for 90 days   Authorization Type UHC    PT Start Time 1102    PT Stop Time 1144    PT Time Calculation (min) 42 min    Equipment Utilized During Treatment Gait belt;Other (comment)    Activity Tolerance Patient tolerated treatment well    Behavior During Therapy [facility] for tasks assessed/performed;Flat affect           Past Medical History:  Diagnosis Date   Diabetes mellitus without complication (HCC)    Hypertension    TIA (transient ischemic attack) 01/22/2019    Past Surgical History:  Procedure Laterality Date   NO PAST SURGERIES      There were no vitals filed for this visit.   Subjective Assessment - 07/23/20 1106    Subjective Patient continues to reports no new changes/complaints since last visit. No falls or pain to report.    Patient is accompained by: Family member    Pertinent History hx of multiple CVA, DM, HTN, drug abuse    Limitations Standing;Walking;House hold activities;Lifting    Currently in Pain? No/denies                             [facility] Adult PT Treatment/Exercise - 07/23/20 0001      Transfers   Transfers Sit to Stand;Stand to Sit;Stand Pivot Transfers    Sit to Stand 5: Supervision;4: Min guard    Sit to Stand Details Tactile cues for weight shifting;Tactile cues for posture;Tactile cues for placement;Verbal cues for safe use of DME/AE;Manual facilitation for weight shifting    Stand to Sit 5:  Supervision;4: Min guard    Stand to Sit Details (indicate cue type and reason) Verbal cues for technique;Verbal cues for precautions/safety    Comments With single UE support, completed sit <> stand training x 10 reps with mirror for visual feedback. PT providing manual faciliation for improved weight shift to LLE.       Therapeutic Activites    Therapeutic Activities Other Therapeutic Activities    Other Therapeutic Activities Completed standing tolerance activity x 3 minutes without UE support. PT providing faciliation for improved weight shift and neutral alignment in standing.       Neuro Re-ed    Neuro Re-ed Details  In // bars completed standing balance activity with reaching to promote improved weight shift to LLE, including reaching to cones x 3 reps. PT providing faciliation for improved weight shift to LLE. patient reports being nervous with weight shift to LLE, as feels as she may lose her balance. PT encouraging her throughout completion, Pt demo improvements with completion with increased reps. Completed alternating toe taps to 4" step x 10 reps bilaterally. With toe tap with RLE, PT providing assitance at posterior L knee to avoid recurvatum with heavy manual tactile/verbal cues for improved technique. Intermittently patient require assistance for positioning and alignment.       Exercises  Exercises Other Exercises    Other Exercises  Completed seated inversion/eversion x 5 reps. increased difficulty with completion often compensating with hip IR/ER, difficutly with stabilizing at knee.                   PT Education - 07/23/20 1213    Education Details Educating on Orthotist coming to session on 11/4    Person(s) Educated Patient    Methods Explanation    Comprehension Verbalized understanding            PT Short Term Goals - 07/06/20 1332      PT SHORT TERM GOAL #1   Title Patient and caregiver will report walking daily in home for improved household mobility  (ALL STGs Due: 07/27/2020)    Baseline not currently walking daily    Time 3    Period Weeks    Status New    Target Date 07/27/20      PT SHORT TERM GOAL #2   Title Patient will improve TUG to <1 min to demonstrate improved functional mobility    Baseline 1 min, 32 secs    Time 3    Period Weeks    Status New      PT SHORT TERM GOAL #3   Title Patient will improve gait speed to > 1.0 ft sec to demonstrate improved household mobility    Baseline not yet assessed    Time 3    Period Weeks    Status New             PT Long Term Goals - 07/06/20 1335      PT LONG TERM GOAL #1   Title Patient will report independence with progressive HEP with caregiver asssistance (ALL LTG's Due: 08/17/20)    Baseline continue to progress HEP    Time 6    Period Weeks    Status New    Target Date 08/17/20      PT LONG TERM GOAL #2   Title Patient will demo ability to ambulate >250 ft with LRAD and CGA to demo improved functional mobility and ambulation within the home    Baseline 14 ft, Min A; 207' with CGA and hemi walker    Time 6    Period Weeks    Status New      PT LONG TERM GOAL #3   Title Patient will demo ability to complete TUG  < 50 seconds to demo improved ambulation and mobility    Baseline 1 min 32 secs    Time 6    Period Weeks    Status Revised      PT LONG TERM GOAL #4   Title Patient will improve gait speed to >/= 1.5 ft/sec to demonstrate improved mobility    Baseline not yet assessed    Time 6    Period Weeks    Status New                 Plan - 07/23/20 1217    Clinical Impression Statement Today's skilled session included continued activities to promote improved weight shift and acceptance on LLE, intermittent UE support and faciliation from PT. Continue to work on improved muscular control as patient continue to demo knee recurvatum with weight shift to LLE. Will continue to progress toward all goals.    Personal Factors and Comorbidities  Comorbidity 3+;Past/Current Experience;Time since onset of injury/illness/exacerbation;Transportation;Behavior Pattern    Comorbidities DM, HTN, hx of multiple strokes in last  6 months    Examination-Activity Limitations Bathing;Bed Mobility;Caring for Others;Carry;Dressing;Hygiene/Grooming;Lift;Reach Overhead;Squat    Examination-Participation Restrictions Cleaning;Community Activity;Driving;Laundry;Yard Work;Occupation;Church;Meal Prep    Stability/Clinical Decision Making Evolving/Moderate complexity    Rehab Potential Good    PT Frequency 2x / week    PT Duration 6 weeks    PT Treatment/Interventions ADLs/Self Care Home Management;Gait training;Stair training;Functional mobility training;Therapeutic activities;Therapeutic exercise;Balance training;Neuromuscular re-education;Manual techniques;Orthotic Fit/Training;Patient/family education;Cognitive remediation;Passive range of motion;Energy conservation;DME Instruction;Electrical Stimulation;Joint Manipulations    PT Next Visit Plan Thayer Ohm from [place] Present on 11/4 @ 11 am. Activites to promote quad actvitiation. Continue gait training with hemiwalker    PT Home Exercise Plan seated hip march, bridge, transfer training (bed, commode), PROM L ankle    Consulted and Agree with Plan of Care Patient;Family member/caregiver    Family Member Consulted Aunt           Patient will benefit from skilled therapeutic intervention in order to improve the following deficits and impairments:  Abnormal gait, Decreased activity tolerance, Decreased balance, Decreased cognition, Decreased coordination, Decreased endurance, Decreased knowledge of precautions, Decreased mobility, Decreased range of motion, Decreased safety awareness, Decreased strength, Difficulty walking, Impaired perceived functional ability, Impaired flexibility, Impaired sensation, Impaired tone, Impaired UE functional use, Postural dysfunction, Pain  Visit Diagnosis: Hemiplegia and  hemiparesis following other cerebrovascular disease affecting left non-dominant side (HCC)  Muscle weakness (generalized)  Other abnormalities of gait and mobility  Unsteadiness on feet  Repeated falls     Problem List Patient Active Problem List   Diagnosis Date Noted   Uncontrolled type 2 diabetes mellitus with hyperglycemia, without long-term current use of insulin (HCC) 04/16/2020   CVA (cerebral vascular accident) (HCC) 04/15/2020   Cocaine use 04/15/2020   Acute CVA (cerebrovascular accident) (HCC) 03/04/2020   Stroke (cerebrum) (HCC) 03/03/2020   Weakness    Noncompliance with medication regimen    TIA (transient ischemic attack) 01/22/2019   Diabetes mellitus due to underlying condition without complications (HCC) 05/30/2014   Essential hypertension, benign 05/30/2014   Smoking 05/30/2014    Tempie Donning, PT, DPT 07/23/2020, 12:20 PM  [facility] [address] [place], [place], [address] Phone: [phone]   Fax:  [phone]  Name: Ebony Matthews MRN: [id number] Date of Birth: 1981/01/21

## 2020-07-23 NOTE — Patient Instructions (Signed)
     Place left arm on table top and slide it forward until a stretch is felt. Hold 3 seconds. Relax. Help with right arm as little as possible Repeat 10 times. Do 2 sessions per day.  Then repeat going out to left side.  10x      SITTING: Reach Across Body    Weight bear on left hand. Reach across body with other hand to reach target.   _10__ reps per set, _2__ sets per day. Hold 10sec Do slowly w/ support to left hand/elbow provided by caregiver.

## 2020-07-23 NOTE — Therapy (Signed)
[facility] [address] [place], [place], [address] Phone: [phone]   Fax:  [phone]  Occupational Therapy Treatment  Patient Details  Name: Ebony Matthews MRN: [id number] Date of Birth: June 17, 1981 Referring Provider (OT): Dr. Everlena Cooper   Encounter Date: 07/23/2020   OT End of Session - 07/23/20 1506    Visit Number 6    Number of Visits 25    Date for OT Re-Evaluation 09/10/20    Authorization Type UHC    Authorization Time Period VL:MN - auth required after 25th visit    Authorization - Visit Number 6    Authorization - Number of Visits 25    Progress Note Due on Visit --    OT Start Time 1021    OT Stop Time 1100    OT Time Calculation (min) 39 min    Equipment Utilized During Treatment gait belt    Activity Tolerance Patient tolerated treatment well    Behavior During Therapy [facility] for tasks assessed/performed;Flat affect           Past Medical History:  Diagnosis Date  . Diabetes mellitus without complication (HCC)   . Hypertension   . TIA (transient ischemic attack) 01/22/2019    Past Surgical History:  Procedure Laterality Date  . NO PAST SURGERIES      There were no vitals filed for this visit.   Subjective Assessment - 07/23/20 1504    Subjective  pt denies pain. she reports that she hasn't attempted dressing tasks.    Patient is accompanied by: Family member   aunt   Pertinent History PMH of CVA 2020, CVA June 2021 (R cerebellar) HTN, IIDM, HLD, on going cocaine/polysubstance abuse, non-compliant with medications and presented with new onset of left arm weakness and numbness.    Limitations Fall Risk. Not Driving. Left Inattention    Patient Stated Goals "to be able to move my arms" and "to be able to hold on and grip on stuff" with LUE    Currently in Pain? No/denies              Discussed ADLs.  Pt reports that she has not tried dressing strategies at home.   Emphasized  importance of incr participation in ADLs.    Standing with CGA and min cueing to perform functional reaching in small ranges in prep for LB dressing, toileting clothing management and incr balance/wt. Shift to L side.   Sitting, light wt. Bearing through BUEs on elbows on table with lateral wt. Shifts and forward wt. Shifts for incr scapular stabilization/attention to L side, and neuro re-ed with min cueing/facilitation.           OT Education - 07/23/20 1505    Education Details Additions to HEP (light wt. bearing in sitting, table slides)--see pt instructions    Person(s) Educated Patient;Caregiver(s)    Methods Explanation;Demonstration;Verbal cues;Handout    Comprehension Verbalized understanding;Returned demonstration;Verbal cues required;Need further instruction            OT Short Term Goals - 07/23/20 1509      OT SHORT TERM GOAL #1   Title Pt will be independent with HEP 07/16/2020    Baseline HEP not issued yet    Time 4    Period Weeks    Status On-going    Target Date 07/16/20      OT SHORT TERM GOAL #2   Title Pt will perform UB dressing with min A and implementing hemitechniques and adapted strategies  PRN in order to increase independence with ADLs    Baseline max A for UB dressing    Time 4    Period Weeks    Status On-going      OT SHORT TERM GOAL #3   Title Pt will perform bathing with mod A and implementing adapted strategies PRN in order to increase independence with ADLs    Baseline total A currently for all bathing    Time 4    Period Weeks    Status On-going   pt reports she is not doing it but simulated in clinic     OT SHORT TERM GOAL #4   Title Patient will demonstrate safe positioning and handling for LUE during transfers with min verbal cues 4/5 trials in order to decrease pain and risk of injury.    Baseline currently severe left inattention to LUE    Time 4    Period Weeks    Status On-going      OT SHORT TERM GOAL #5   Title Pt  will complete table top scanning with 50 % accuracy in order to increase awareness to left hemisphere.    Baseline 29/121 or approx 24%    Time 4    Period Weeks    Status New      OT SHORT TERM GOAL #6   Title Patient will prepare a light snack with mod A and adapted strategies PRN in order to increase independence with IADLs.    Baseline total A for snack prep    Time 4    Period Weeks    Status New             OT Long Term Goals - 06/18/20 1545      OT LONG TERM GOAL #1   Title Pt will be independent with updated HEP 08/13/20    Baseline not implemented .    Time 12    Period Weeks    Status New    Target Date 08/13/20      OT LONG TERM GOAL #2   Title Pt will perform UB dressing with supervision and implementing hemitechniques and adapted strategies PRN in order to increase independence with ADLs    Baseline max A for UB dressing    Time 12    Period Weeks    Status New      OT LONG TERM GOAL #3   Title Pt will perform bathing with min A and implementing adapted strategies PRN in order to increase independence with ADLs    Baseline total A for bathing at evaluation    Time 12    Period Weeks    Status New      OT LONG TERM GOAL #4   Title Patient will demonstrate safe positioning and handling for LUE during transfers Independently (with no verbal cues) 4/5 trials in order to decrease pain and risk of injury.    Baseline currently left inattention to LUE    Time 12    Period Weeks    Status New      OT LONG TERM GOAL #5   Title Pt will complete table top scanning with 75% accuracy in order to increase awareness to left hemisphere.    Baseline 24% accuracy -29/121 double digit cancellation    Time 12    Period Weeks    Status New      OT LONG TERM GOAL #6   Title Patient will prepare a light snack with min  A and adapted strategies PRN in order to increase independence with IADLs.    Baseline not preparing snacks    Time 12    Period Weeks    Status New        OT LONG TERM GOAL #7   Title Pt will utilize LUE as a stabilizer with no more than 2 verbal cues in order to increase independence and use of LUE.    Time 12    Period Weeks    Status New                 Plan - 07/23/20 1507    Clinical Impression Statement Pt progressing slowly with shoulder activitation.  Pt reports decr carryover of ADL strategies.  Pt would benefit from reinforcement of value in incr participation in ADLs.    OT Occupational Profile and History Detailed Assessment- Review of Records and additional review of physical, cognitive, psychosocial history related to current functional performance    Occupational performance deficits (Please refer to evaluation for details): IADL's;ADL's;Rest and Sleep;Education    Body Structure / Function / Physical Skills ADL;Strength;Gait;Decreased knowledge of use of DME;Balance;Pain;UE functional use;GMC;Dexterity;Endurance;IADL;ROM;Vision;Coordination;Flexibility;Mobility;Sensation;FMC;Decreased knowledge of precautions    Cognitive Skills Attention;Safety Awareness;Problem Solve;Understand;Sequencing    Rehab Potential Good    Clinical Decision Making Several treatment options, min-mod task modification necessary    Comorbidities Affecting Occupational Performance: May have comorbidities impacting occupational performance    Modification or Assistance to Complete Evaluation  Min-Moderate modification of tasks or assist with assess necessary to complete eval    OT Frequency 2x / week    OT Duration 12 weeks    OT Treatment/Interventions Self-care/ADL training;Moist Heat;DME and/or AE instruction;Splinting;Balance training;Fluidtherapy;Gait Training;Therapeutic activities;Therapeutic exercise;Ultrasound;Electrical Stimulation;Energy conservation;Manual Therapy;Patient/family education;Visual/perceptual remediation/compensation;Passive range of motion;Functional Mobility Training;Neuromuscular education    Plan check STGs;  self-care and dressing techniques- continue, table top scanning, NMR LUE and trunk    Consulted and Agree with Plan of Care Patient;Family member/caregiver    Family Member Consulted Aunt           Patient will benefit from skilled therapeutic intervention in order to improve the following deficits and impairments:   Body Structure / Function / Physical Skills: ADL, Strength, Gait, Decreased knowledge of use of DME, Balance, Pain, UE functional use, GMC, Dexterity, Endurance, IADL, ROM, Vision, Coordination, Flexibility, Mobility, Sensation, FMC, Decreased knowledge of precautions Cognitive Skills: Attention, Safety Awareness, Problem Solve, Understand, Sequencing     Visit Diagnosis: Hemiplegia and hemiparesis following other cerebrovascular disease affecting left non-dominant side (HCC)  Unsteadiness on feet  Other lack of coordination  Stiffness of left shoulder, not elsewhere classified  Attention and concentration deficit    Problem List Patient Active Problem List   Diagnosis Date Noted  . Uncontrolled type 2 diabetes mellitus with hyperglycemia, without long-term current use of insulin (HCC) 04/16/2020  . CVA (cerebral vascular accident) (HCC) 04/15/2020  . Cocaine use 04/15/2020  . Acute CVA (cerebrovascular accident) (HCC) 03/04/2020  . Stroke (cerebrum) (HCC) 03/03/2020  . Weakness   . Noncompliance with medication regimen   . TIA (transient ischemic attack) 01/22/2019  . Diabetes mellitus due to underlying condition without complications (HCC) 05/30/2014  . Essential hypertension, benign 05/30/2014  . Smoking 05/30/2014    [facility] Institute[facility]REEMAN,Marsa Matteo 07/23/2020, 3:16 PM  [facility] [address] [place], [place], [address] Phone: [phone]   Fax:  [phone]  Name: Ebony EpleyRuby Matthews MRN: [id number] Date of Birth: 04-06-1981   Willa FraterAngela Mija Effertz, OTR/L   [facility] [address]  [address] [place], [place]  [address] [phone] phone [phone] 07/23/20 3:16 PM

## 2020-07-26 ENCOUNTER — Ambulatory Visit: Payer: Medicaid Other | Admitting: Occupational Therapy

## 2020-07-26 ENCOUNTER — Ambulatory Visit: Payer: Medicaid Other

## 2020-07-26 ENCOUNTER — Encounter: Payer: Self-pay | Admitting: Occupational Therapy

## 2020-07-26 ENCOUNTER — Telehealth: Payer: Self-pay

## 2020-07-26 ENCOUNTER — Other Ambulatory Visit: Payer: Self-pay

## 2020-07-26 DIAGNOSIS — R2689 Other abnormalities of gait and mobility: Secondary | ICD-10-CM

## 2020-07-26 DIAGNOSIS — R278 Other lack of coordination: Secondary | ICD-10-CM

## 2020-07-26 DIAGNOSIS — M25512 Pain in left shoulder: Secondary | ICD-10-CM

## 2020-07-26 DIAGNOSIS — R41840 Attention and concentration deficit: Secondary | ICD-10-CM

## 2020-07-26 DIAGNOSIS — I69854 Hemiplegia and hemiparesis following other cerebrovascular disease affecting left non-dominant side: Secondary | ICD-10-CM | POA: Diagnosis not present

## 2020-07-26 DIAGNOSIS — R296 Repeated falls: Secondary | ICD-10-CM

## 2020-07-26 DIAGNOSIS — M6281 Muscle weakness (generalized): Secondary | ICD-10-CM

## 2020-07-26 DIAGNOSIS — M25612 Stiffness of left shoulder, not elsewhere classified: Secondary | ICD-10-CM

## 2020-07-26 DIAGNOSIS — R2681 Unsteadiness on feet: Secondary | ICD-10-CM

## 2020-07-26 NOTE — Therapy (Signed)
[facility] [address] [place], [place], [address] Phone: [phone]   Fax:  [phone]  Occupational Therapy Treatment  Patient Details  Name: Ebony Matthews MRN: [id number] Date of Birth: 01-06-1981 Referring Provider (OT): Dr. Everlena Cooper   Encounter Date: 07/26/2020   OT End of Session - 07/26/20 1102    Visit Number 7    Number of Visits 25    Date for OT Re-Evaluation 09/10/20    Authorization Type UHC    Authorization Time Period VL:MN - auth required after 25th visit    Authorization - Visit Number 7    Authorization - Number of Visits 25    OT Start Time 1102    OT Stop Time 1145    OT Time Calculation (min) 43 min    Equipment Utilized During Treatment gait belt    Activity Tolerance Patient tolerated treatment well    Behavior During Therapy [facility] for tasks assessed/performed;Flat affect           Past Medical History:  Diagnosis Date  . Diabetes mellitus without complication (HCC)   . Hypertension   . TIA (transient ischemic attack) 01/22/2019    Past Surgical History:  Procedure Laterality Date  . NO PAST SURGERIES      There were no vitals filed for this visit.                 OT Treatments/Exercises (OP) - 07/26/20 1107      Visual/Perceptual Exercises   Scanning Tabletop    Scanning - Tabletop digital/analog times. Pt did not need cues for scanning and attending to the left but required increased time and min A for verbal and visual cues. Table top number cancellation with 83% accuracy      Neurological Re-education Exercises   Other Exercises 1 LUE flaccid and pt with poor attention to LUE this day with standing.    Weight Shifting Lateral    Weight-Shifting Exercises - Lateral standing lateral weight shift with hips and on LE - min A for standing balance                  OT Education - 07/26/20 1136    Education Details issued arm care after stroke handout -  see pt instructions. Access Code: [email or web address]    Person(s) Educated Patient;Caregiver(s)    Methods Explanation;Demonstration    Comprehension Verbalized understanding;Returned demonstration;Need further instruction            OT Short Term Goals - 07/23/20 1509      OT SHORT TERM GOAL #1   Title Pt will be independent with HEP 07/16/2020    Baseline HEP not issued yet    Time 4    Period Weeks    Status On-going    Target Date 07/16/20      OT SHORT TERM GOAL #2   Title Pt will perform UB dressing with min A and implementing hemitechniques and adapted strategies PRN in order to increase independence with ADLs    Baseline max A for UB dressing    Time 4    Period Weeks    Status On-going      OT SHORT TERM GOAL #3   Title Pt will perform bathing with mod A and implementing adapted strategies PRN in order to increase independence with ADLs    Baseline total A currently for all bathing    Time 4    Period Weeks    Status On-going  pt reports she is not doing it but simulated in clinic     OT SHORT TERM GOAL #4   Title Patient will demonstrate safe positioning and handling for LUE during transfers with min verbal cues 4/5 trials in order to decrease pain and risk of injury.    Baseline currently severe left inattention to LUE    Time 4    Period Weeks    Status On-going      OT SHORT TERM GOAL #5   Title Pt will complete table top scanning with 50 % accuracy in order to increase awareness to left hemisphere.    Baseline 29/121 or approx 24%    Time 4    Period Weeks    Status New      OT SHORT TERM GOAL #6   Title Patient will prepare a light snack with mod A and adapted strategies PRN in order to increase independence with IADLs.    Baseline total A for snack prep    Time 4    Period Weeks    Status New             OT Long Term Goals - 06/18/20 1545      OT LONG TERM GOAL #1   Title Pt will be independent with updated HEP 08/13/20    Baseline not implemented  .    Time 12    Period Weeks    Status New    Target Date 08/13/20      OT LONG TERM GOAL #2   Title Pt will perform UB dressing with supervision and implementing hemitechniques and adapted strategies PRN in order to increase independence with ADLs    Baseline max A for UB dressing    Time 12    Period Weeks    Status New      OT LONG TERM GOAL #3   Title Pt will perform bathing with min A and implementing adapted strategies PRN in order to increase independence with ADLs    Baseline total A for bathing at evaluation    Time 12    Period Weeks    Status New      OT LONG TERM GOAL #4   Title Patient will demonstrate safe positioning and handling for LUE during transfers Independently (with no verbal cues) 4/5 trials in order to decrease pain and risk of injury.    Baseline currently left inattention to LUE    Time 12    Period Weeks    Status New      OT LONG TERM GOAL #5   Title Pt will complete table top scanning with 75% accuracy in order to increase awareness to left hemisphere.    Baseline 24% accuracy -29/121 double digit cancellation    Time 12    Period Weeks    Status New      OT LONG TERM GOAL #6   Title Patient will prepare a light snack with min A and adapted strategies PRN in order to increase independence with IADLs.    Baseline not preparing snacks    Time 12    Period Weeks    Status New      OT LONG TERM GOAL #7   Title Pt will utilize LUE as a stabilizer with no more than 2 verbal cues in order to increase independence and use of LUE.    Time 12    Period Weeks    Status New  Plan - 07/26/20 1115    Clinical Impression Statement Pt    OT Occupational Profile and History Detailed Assessment- Review of Records and additional review of physical, cognitive, psychosocial history related to current functional performance    Occupational performance deficits (Please refer to evaluation for details): IADL's;ADL's;Rest and  Sleep;Education    Body Structure / Function / Physical Skills ADL;Strength;Gait;Decreased knowledge of use of DME;Balance;Pain;UE functional use;GMC;Dexterity;Endurance;IADL;ROM;Vision;Coordination;Flexibility;Mobility;Sensation;FMC;Decreased knowledge of precautions    Cognitive Skills Attention;Safety Awareness;Problem Solve;Understand;Sequencing    Rehab Potential Good    Clinical Decision Making Several treatment options, min-mod task modification necessary    Comorbidities Affecting Occupational Performance: May have comorbidities impacting occupational performance    Modification or Assistance to Complete Evaluation  Min-Moderate modification of tasks or assist with assess necessary to complete eval    OT Frequency 2x / week    OT Duration 12 weeks    OT Treatment/Interventions Self-care/ADL training;Moist Heat;DME and/or AE instruction;Splinting;Balance training;Fluidtherapy;Gait Training;Therapeutic activities;Therapeutic exercise;Ultrasound;Electrical Stimulation;Energy conservation;Manual Therapy;Patient/family education;Visual/perceptual remediation/compensation;Passive range of motion;Functional Mobility Training;Neuromuscular education    Plan check STGs; self-care and dressing techniques- continue, table top scanning, NMR LUE and trunk    Consulted and Agree with Plan of Care Patient;Family member/caregiver    Family Member Consulted Aunt           Patient will benefit from skilled therapeutic intervention in order to improve the following deficits and impairments:   Body Structure / Function / Physical Skills: ADL, Strength, Gait, Decreased knowledge of use of DME, Balance, Pain, UE functional use, GMC, Dexterity, Endurance, IADL, ROM, Vision, Coordination, Flexibility, Mobility, Sensation, FMC, Decreased knowledge of precautions Cognitive Skills: Attention, Safety Awareness, Problem Solve, Understand, Sequencing     Visit Diagnosis: Hemiplegia and hemiparesis following other  cerebrovascular disease affecting left non-dominant side (HCC)  Muscle weakness (generalized)  Unsteadiness on feet  Other abnormalities of gait and mobility  Acute pain of left shoulder  Attention and concentration deficit  Stiffness of left shoulder, not elsewhere classified  Other lack of coordination    Problem List Patient Active Problem List   Diagnosis Date Noted  . Uncontrolled type 2 diabetes mellitus with hyperglycemia, without long-term current use of insulin (HCC) 04/16/2020  . CVA (cerebral vascular accident) (HCC) 04/15/2020  . Cocaine use 04/15/2020  . Acute CVA (cerebrovascular accident) (HCC) 03/04/2020  . Stroke (cerebrum) (HCC) 03/03/2020  . Weakness   . Noncompliance with medication regimen   . TIA (transient ischemic attack) 01/22/2019  . Diabetes mellitus due to underlying condition without complications (HCC) 05/30/2014  . Essential hypertension, benign 05/30/2014  . Smoking 05/30/2014    Junious Dresser MOT, OTR/L  07/26/2020, 5:36 PM  [facility] [address] [place], [place], [address] Phone: [phone]   Fax:  [phone]  Name: Ebony Matthews MRN: [id number] Date of Birth: [birth year]

## 2020-07-26 NOTE — Patient Instructions (Signed)
Access Code: [email or web address] URL: [email or web address] Date: 07/26/2020 Prepared by: Kallie Edward  Patient Education Arm Care after Stroke

## 2020-07-26 NOTE — Telephone Encounter (Signed)
If possible, please fax the order for AFO for Ebony Matthews to [facility], Fax: [phone]) instead of [employer].   Thank you,  Adelfa Koh, PT, DPT

## 2020-07-26 NOTE — Telephone Encounter (Signed)
Thank you :)

## 2020-07-26 NOTE — Telephone Encounter (Signed)
Will fax.

## 2020-07-26 NOTE — Therapy (Signed)
[facility] [address] [place], [place], [address] Phone: [phone]   Fax:  [phone]  Physical Therapy Treatment  Patient Details  Name: Ebony Matthews MRN: [id number] Date of Birth: 09/21/1981 Referring Provider (PT): Referred by: Georgiann Cocker, MD. Followed by Shon Millet, MD   Encounter Date: 07/26/2020   PT End of Session - 07/26/20 1023    Visit Number 22    Number of Visits 28    Date for PT Re-Evaluation 10/04/20   POC for 6 weeks, Cert for 90 days   Authorization Type UHC    PT Start Time 1017    PT Stop Time 1100    PT Time Calculation (min) 43 min    Equipment Utilized During Treatment Gait belt;Other (comment)    Activity Tolerance Patient tolerated treatment well    Behavior During Therapy [facility] for tasks assessed/performed;Flat affect           Past Medical History:  Diagnosis Date  . Diabetes mellitus without complication (HCC)   . Hypertension   . TIA (transient ischemic attack) 01/22/2019    Past Surgical History:  Procedure Laterality Date  . NO PAST SURGERIES      There were no vitals filed for this visit.   Subjective Assessment - 07/26/20 1023    Subjective Patient continues to report no new changes. Has appt with PCP next week. No falls or pain to report.    Patient is accompained by: Family member    Pertinent History hx of multiple CVA, DM, HTN, drug abuse    Limitations Standing;Walking;House hold activities;Lifting    Currently in Pain? No/denies                 [facility] Adult PT Treatment/Exercise - 07/26/20 0001      Transfers   Transfers Sit to Stand;Stand to Sit;Stand Pivot Transfers    Sit to Stand 5: Supervision;4: Min guard    Sit to Stand Details Tactile cues for weight shifting;Tactile cues for posture;Tactile cues for placement;Verbal cues for safe use of DME/AE;Manual facilitation for weight shifting    Stand to Sit 5: Supervision;4: Min guard    Stand  to Sit Details (indicate cue type and reason) Verbal cues for technique;Verbal cues for precautions/safety    Stand Pivot Transfers 4: Min assist;4: Min guard    Stand Pivot Transfer Details (indicate cue type and reason) completed transfer from w/c <> mat x 4 reps, continued max verbal cues and tactile cues required for proper completion. Patient continue to demo decreased control/placement of LLE requiring assistance for placement. All completed with Ankle ASO donned.       Ambulation/Gait   Ambulation/Gait Yes    Ambulation/Gait Assistance 4: Min guard    Ambulation/Gait Assistance Details completed ambulation with L posterior ottobok with heel wedge to help promote improved gait pattern. patient continues to demo intermittent improvements in recurvatum but requires tactile and max verbal cues to attend to LLE. with distraction decreased control noted.      Ambulation Distance (Feet) 25 Feet    Assistive device Hemi-walker    Gait Pattern Step-to pattern;Decreased arm swing - left;Decreased arm swing - right;Decreased step length - left;Decreased stance time - right;Decreased hip/knee flexion - right;Decreased hip/knee flexion - left;Decreased dorsiflexion - left;Decreased weight shift to left    Ambulation Surface Level;Indoor      Standardized Balance Assessment   Standardized Balance Assessment Timed Up and Go Test      Timed Up  and Go Test   TUG Normal TUG    Normal TUG (seconds) --   1 minute, 17 secs     Therapeutic Activites    Therapeutic Activities Other Therapeutic Activities    Other Therapeutic Activities Completed standing tolerance without UE support, PT providing verbal/tactile cues and manual faciliation for equal weight shift to promote improved alignment.       Exercises   Exercises Other Exercises    Other Exercises  Seated completed the following exercises without Ankle ASO donned on LLE. Completed inversion/eversion with towel under LLE, minimal motion noted  requiring intermittent Min A from PT. continue to require stabilization at knee to avoid IR/ER of hip. Completed heel slides with towel under LLE in seated position, patient demo improved knee flexion but decreased knee extension and require Min A for completion. Completed x 10 reps.                   PT Education - 07/26/20 1118    Education Details progress toward STGs    Person(s) Educated Patient;Other (comment)   Aunt   Methods Explanation    Comprehension Verbalized understanding            PT Short Term Goals - 07/26/20 1024      PT SHORT TERM GOAL #1   Title Patient and caregiver will report walking daily in home for improved household mobility (ALL STGs Due: 07/27/2020)    Baseline continues to report not walking daily, currently walking approx 2-3x/week    Time 3    Period Weeks    Status On-going    Target Date 07/27/20      PT SHORT TERM GOAL #2   Title Patient will improve TUG to <1 min to demonstrate improved functional mobility    Baseline 1 min, 32 secs; 10/28 1 min, 17 secs    Time 3    Period Weeks    Status On-going      PT SHORT TERM GOAL #3   Title Patient will improve gait speed to > 1.0 ft sec to demonstrate improved household mobility    Baseline deferred as unable to walk without assistance    Time 3    Period Weeks    Status Deferred             PT Long Term Goals - 07/06/20 1335      PT LONG TERM GOAL #1   Title Patient will report independence with progressive HEP with caregiver asssistance (ALL LTG's Due: 08/17/20)    Baseline continue to progress HEP    Time 6    Period Weeks    Status New    Target Date 08/17/20      PT LONG TERM GOAL #2   Title Patient will demo ability to ambulate >250 ft with LRAD and CGA to demo improved functional mobility and ambulation within the home    Baseline 14 ft, Min A; 207' with CGA and hemi walker    Time 6    Period Weeks    Status New      PT LONG TERM GOAL #3   Title Patient will  demo ability to complete TUG  < 50 seconds to demo improved ambulation and mobility    Baseline 1 min 32 secs    Time 6    Period Weeks    Status Revised      PT LONG TERM GOAL #4   Title Patient will improve gait speed to >/=  1.5 ft/sec to demonstrate improved mobility    Baseline not yet assessed    Time 6    Period Weeks    Status New                 Plan - 07/26/20 1119    Clinical Impression Statement Today's skilled PT session included assesment of STG's. Patient making progress toward STG #1 and #2. Patient improved TUG time to 1 min 17 seconds, but still unable to meet goal today. STG #3 deferred at this time due to patient unable to ambulate without assistance therefore gait speed not assessed today. Continued activities to promote muscle activation in LLE, as well as continued transfer training. Will continue to progress toward all goals.    Personal Factors and Comorbidities Comorbidity 3+;Past/Current Experience;Time since onset of injury/illness/exacerbation;Transportation;Behavior Pattern    Comorbidities DM, HTN, hx of multiple strokes in last 6 months    Examination-Activity Limitations Bathing;Bed Mobility;Caring for Others;Carry;Dressing;Hygiene/Grooming;Lift;Reach Overhead;Squat    Examination-Participation Restrictions Cleaning;Community Activity;Driving;Laundry;Yard Work;Occupation;Church;Meal Prep    Stability/Clinical Decision Making Evolving/Moderate complexity    Rehab Potential Good    PT Frequency 2x / week    PT Duration 6 weeks    PT Treatment/Interventions ADLs/Self Care Home Management;Gait training;Stair training;Functional mobility training;Therapeutic activities;Therapeutic exercise;Balance training;Neuromuscular re-education;Manual techniques;Orthotic Fit/Training;Patient/family education;Cognitive remediation;Passive range of motion;Energy conservation;DME Instruction;Electrical Stimulation;Joint Manipulations    PT Next Visit Plan Thayer Ohm from  [place] Present on 11/4 @ 11 am. Have completed gait training with L Post Ottobok with Heel Wedge. I believe patient will need custom AFO. continue activites to promote quad actvitiation. transfer training.    PT Home Exercise Plan seated hip march, bridge, transfer training (bed, commode), PROM L ankle    Consulted and Agree with Plan of Care Patient;Family member/caregiver    Family Member Consulted Aunt           Patient will benefit from skilled therapeutic intervention in order to improve the following deficits and impairments:  Abnormal gait, Decreased activity tolerance, Decreased balance, Decreased cognition, Decreased coordination, Decreased endurance, Decreased knowledge of precautions, Decreased mobility, Decreased range of motion, Decreased safety awareness, Decreased strength, Difficulty walking, Impaired perceived functional ability, Impaired flexibility, Impaired sensation, Impaired tone, Impaired UE functional use, Postural dysfunction, Pain  Visit Diagnosis: Hemiplegia and hemiparesis following other cerebrovascular disease affecting left non-dominant side (HCC)  Unsteadiness on feet  Muscle weakness (generalized)  Other abnormalities of gait and mobility  Repeated falls     Problem List Patient Active Problem List   Diagnosis Date Noted  . Uncontrolled type 2 diabetes mellitus with hyperglycemia, without long-term current use of insulin (HCC) 04/16/2020  . CVA (cerebral vascular accident) (HCC) 04/15/2020  . Cocaine use 04/15/2020  . Acute CVA (cerebrovascular accident) (HCC) 03/04/2020  . Stroke (cerebrum) (HCC) 03/03/2020  . Weakness   . Noncompliance with medication regimen   . TIA (transient ischemic attack) 01/22/2019  . Diabetes mellitus due to underlying condition without complications (HCC) 05/30/2014  . Essential hypertension, benign 05/30/2014  . Smoking 05/30/2014    Tempie Donning, PT, DPT 07/26/2020, 11:27 AM  [facility] [address] [place], [place], [address] Phone: [phone]   Fax:  [phone]  Name: Ebony Matthews MRN: [id number] Date of Birth: Feb 16, 1981

## 2020-07-31 ENCOUNTER — Ambulatory Visit: Payer: Medicaid Other | Admitting: Physical Therapy

## 2020-07-31 ENCOUNTER — Encounter: Payer: Self-pay | Admitting: Occupational Therapy

## 2020-07-31 ENCOUNTER — Other Ambulatory Visit: Payer: Self-pay

## 2020-07-31 ENCOUNTER — Ambulatory Visit: Payer: Medicaid Other | Attending: Internal Medicine | Admitting: Occupational Therapy

## 2020-07-31 DIAGNOSIS — R278 Other lack of coordination: Secondary | ICD-10-CM | POA: Insufficient documentation

## 2020-07-31 DIAGNOSIS — R41840 Attention and concentration deficit: Secondary | ICD-10-CM | POA: Diagnosis present

## 2020-07-31 DIAGNOSIS — R2689 Other abnormalities of gait and mobility: Secondary | ICD-10-CM | POA: Diagnosis present

## 2020-07-31 DIAGNOSIS — M25612 Stiffness of left shoulder, not elsewhere classified: Secondary | ICD-10-CM | POA: Diagnosis present

## 2020-07-31 DIAGNOSIS — M6281 Muscle weakness (generalized): Secondary | ICD-10-CM

## 2020-07-31 DIAGNOSIS — M25672 Stiffness of left ankle, not elsewhere classified: Secondary | ICD-10-CM | POA: Insufficient documentation

## 2020-07-31 DIAGNOSIS — I69854 Hemiplegia and hemiparesis following other cerebrovascular disease affecting left non-dominant side: Secondary | ICD-10-CM | POA: Diagnosis present

## 2020-07-31 DIAGNOSIS — R2681 Unsteadiness on feet: Secondary | ICD-10-CM | POA: Insufficient documentation

## 2020-07-31 DIAGNOSIS — M25512 Pain in left shoulder: Secondary | ICD-10-CM | POA: Diagnosis present

## 2020-07-31 NOTE — Therapy (Signed)
[facility] [address] [place], [place], [address] Phone: [phone]   Fax:  [phone]  Occupational Therapy Treatment  Patient Details  Name: Ebony EpleyRuby Matthews MRN: [id number] Date of Birth: 1981/08/08 Referring Provider (OT): Dr. Everlena CooperJaffe   Encounter Date: 07/31/2020   OT End of Session - 07/31/20 1221    Visit Number 8    Number of Visits 25    Date for OT Re-Evaluation 09/10/20    Authorization Type UHC    Authorization Time Period VL:MN - auth required after 25th visit    Authorization - Visit Number 8    Authorization - Number of Visits 25    OT Start Time 1150    OT Stop Time 1228    OT Time Calculation (min) 38 min    Equipment Utilized During Treatment gait belt    Activity Tolerance Patient tolerated treatment well    Behavior [facility] for tasks assessed/performed;Flat affect           Past Medical History:  Diagnosis Date  . Diabetes mellitus without complication (HCC)   . Hypertension   . TIA (transient ischemic attack) 01/22/2019    Past Surgical History:  Procedure Laterality Date  . NO PAST SURGERIES      There were no vitals filed for this visit.   Subjective Assessment - 07/31/20 1152    Subjective  Pt reports stiffness and pain in LUE hand (7/10)    Patient is accompanied by: Family member   aunt   Pertinent History PMH of CVA 2020, CVA June 2021 (R cerebellar) HTN, IIDM, HLD, on going cocaine/polysubstance abuse, non-compliant with medications and presented with new onset of left arm weakness and numbness.    Limitations Fall Risk. Not Driving. Left Inattention    Patient Stated Goals "to be able to move my arms" and "to be able to hold on and grip on stuff" with LUE    Currently in Pain? Yes    Pain Score 7     Pain Location Hand    Pain Orientation Left    Pain Descriptors / Indicators Tightness;Aching;Sore    Pain Type Acute pain    Pain Onset In the past 7 days     Pain Frequency Intermittent    Pain Relieving Factors positioning                        OT Treatments/Exercises (OP) - 07/31/20 1159      ADLs   UB Dressing doff and don open faced jacket with max A      Neurological Re-education Exercises   Other Exercises 1 LUE shoulder flexion/reach with x 10 on stool with total A    Other Weight-Bearing Exercises 1 side lying on LUE with AAROM with LUE while weight bearing on L side for elbow range of motion.Marland Kitchen. x 10 with UE ranger in sitting      Manual Therapy   Manual Therapy Passive ROM    Passive ROM LUE hand - increased tone noted in LUE hand flexion                    OT Short Term Goals - 07/23/20 1509      OT SHORT TERM GOAL #1   Title Pt will be independent with HEP 07/16/2020    Baseline HEP not issued yet    Time 4    Period Weeks    Status On-going  Target Date 07/16/20      OT SHORT TERM GOAL #2   Title Pt will perform UB dressing with min A and implementing hemitechniques and adapted strategies PRN in order to increase independence with ADLs    Baseline max A for UB dressing    Time 4    Period Weeks    Status On-going      OT SHORT TERM GOAL #3   Title Pt will perform bathing with mod A and implementing adapted strategies PRN in order to increase independence with ADLs    Baseline total A currently for all bathing    Time 4    Period Weeks    Status On-going   pt reports she is not doing it but simulated in clinic     OT SHORT TERM GOAL #4   Title Patient will demonstrate safe positioning and handling for LUE during transfers with min verbal cues 4/5 trials in order to decrease pain and risk of injury.    Baseline currently severe left inattention to LUE    Time 4    Period Weeks    Status On-going      OT SHORT TERM GOAL #5   Title Pt will complete table top scanning with 50 % accuracy in order to increase awareness to left hemisphere.    Baseline 29/121 or approx 24%    Time 4     Period Weeks    Status New      OT SHORT TERM GOAL #6   Title Patient will prepare a light snack with mod A and adapted strategies PRN in order to increase independence with IADLs.    Baseline total A for snack prep    Time 4    Period Weeks    Status New             OT Long Term Goals - 06/18/20 1545      OT LONG TERM GOAL #1   Title Pt will be independent with updated HEP 08/13/20    Baseline not implemented .    Time 12    Period Weeks    Status New    Target Date 08/13/20      OT LONG TERM GOAL #2   Title Pt will perform UB dressing with supervision and implementing hemitechniques and adapted strategies PRN in order to increase independence with ADLs    Baseline max A for UB dressing    Time 12    Period Weeks    Status New      OT LONG TERM GOAL #3   Title Pt will perform bathing with min A and implementing adapted strategies PRN in order to increase independence with ADLs    Baseline total A for bathing at evaluation    Time 12    Period Weeks    Status New      OT LONG TERM GOAL #4   Title Patient will demonstrate safe positioning and handling for LUE during transfers Independently (with no verbal cues) 4/5 trials in order to decrease pain and risk of injury.    Baseline currently left inattention to LUE    Time 12    Period Weeks    Status New      OT LONG TERM GOAL #5   Title Pt will complete table top scanning with 75% accuracy in order to increase awareness to left hemisphere.    Baseline 24% accuracy -29/121 double digit cancellation    Time 12  Period Weeks    Status New      OT LONG TERM GOAL #6   Title Patient will prepare a light snack with min A and adapted strategies PRN in order to increase independence with IADLs.    Baseline not preparing snacks    Time 12    Period Weeks    Status New      OT LONG TERM GOAL #7   Title Pt will utilize LUE as a stabilizer with no more than 2 verbal cues in order to increase independence and use of  LUE.    Time 12    Period Weeks    Status New                 Plan - 07/31/20 1234    Clinical Impression Statement Pt progressing towards goals. Poor carryover with dressing and ADLs. Slight increase in tone in LUE hand flexion and slight muscle activation (trace) noted in LUE.    OT Occupational Profile and History Detailed Assessment- Review of Records and additional review of physical, cognitive, psychosocial history related to current functional performance    Occupational performance deficits (Please refer to evaluation for details): IADL's;ADL's;Rest and Sleep;Education    Body Structure / Function / Physical Skills ADL;Strength;Gait;Decreased knowledge of use of DME;Balance;Pain;UE functional use;GMC;Dexterity;Endurance;IADL;ROM;Vision;Coordination;Flexibility;Mobility;Sensation;FMC;Decreased knowledge of precautions    Cognitive Skills Attention;Safety Awareness;Problem Solve;Understand;Sequencing    Rehab Potential Good    Clinical Decision Making Several treatment options, min-mod task modification necessary    Comorbidities Affecting Occupational Performance: May have comorbidities impacting occupational performance    Modification or Assistance to Complete Evaluation  Min-Moderate modification of tasks or assist with assess necessary to complete eval    OT Frequency 2x / week    OT Duration 12 weeks    OT Treatment/Interventions Self-care/ADL training;Moist Heat;DME and/or AE instruction;Splinting;Balance training;Fluidtherapy;Gait Training;Therapeutic activities;Therapeutic exercise;Ultrasound;Electrical Stimulation;Energy conservation;Manual Therapy;Patient/family education;Visual/perceptual remediation/compensation;Passive range of motion;Functional Mobility Training;Neuromuscular education    Plan NMR LUE    Consulted and Agree with Plan of Care Patient;Family member/caregiver    Family Member Consulted Aunt           Patient will benefit from skilled therapeutic  intervention in order to improve the following deficits and impairments:   Body Structure / Function / Physical Skills: ADL, Strength, Gait, Decreased knowledge of use of DME, Balance, Pain, UE functional use, GMC, Dexterity, Endurance, IADL, ROM, Vision, Coordination, Flexibility, Mobility, Sensation, FMC, Decreased knowledge of precautions Cognitive Skills: Attention, Safety Awareness, Problem Solve, Understand, Sequencing     Visit Diagnosis: Muscle weakness (generalized)  Unsteadiness on feet  Hemiplegia and hemiparesis following other cerebrovascular disease affecting left non-dominant side (HCC)  Other abnormalities of gait and mobility  Acute pain of left shoulder  Attention and concentration deficit  Stiffness of left shoulder, not elsewhere classified  Other lack of coordination    Problem List Patient Active Problem List   Diagnosis Date Noted  . Uncontrolled type 2 diabetes mellitus with hyperglycemia, without long-term current use of insulin (HCC) 04/16/2020  . CVA (cerebral vascular accident) (HCC) 04/15/2020  . Cocaine use 04/15/2020  . Acute CVA (cerebrovascular accident) (HCC) 03/04/2020  . Stroke (cerebrum) (HCC) 03/03/2020  . Weakness   . Noncompliance with medication regimen   . TIA (transient ischemic attack) 01/22/2019  . Diabetes mellitus due to underlying condition without complications (HCC) 05/30/2014  . Essential hypertension, benign 05/30/2014  . Smoking 05/30/2014    Junious Dresser MOT, OTR/L  07/31/2020, 12:35 PM  [place]  [facility] [address] [place], [place], [address] Phone: [phone]   Fax:  [phone]  Name: Analaya Hoey MRN: [id number] Date of Birth: 1980-11-02

## 2020-07-31 NOTE — Therapy (Signed)
[facility] [address] [place], [place], [address] Phone: [phone]   Fax:  [phone]  Physical Therapy Treatment  Patient Details  Name: Ebony Matthews MRN: [id number] Date of Birth: [birth year] Referring Provider (PT): Referred by: Georgiann Cocker, MD. Followed by Shon Millet, MD   Encounter Date: 07/31/2020   PT End of Session - 07/31/20 1407    Visit Number 23    Number of Visits 28    Date for PT Re-Evaluation 10/04/20    Authorization Type UHC    PT Start Time 1102    PT Stop Time 1150    PT Time Calculation (min) 48 min    Equipment Utilized During Treatment Gait belt;Other (comment)   posterior ottobak   Activity Tolerance Patient tolerated treatment well    Behavior During Therapy [facility] for tasks assessed/performed;Flat affect           Past Medical History:  Diagnosis Date  . Diabetes mellitus without complication (HCC)   . Hypertension   . TIA (transient ischemic attack) 01/22/2019    Past Surgical History:  Procedure Laterality Date  . NO PAST SURGERIES      There were no vitals filed for this visit.   Subjective Assessment - 07/31/20 1404    Subjective Pt reports no new changes or complaints    Patient is accompained by: Family member    Pertinent History hx of multiple CVA, DM, HTN, drug abuse    Limitations Standing;Walking;House hold activities;Lifting    How long can you sit comfortably? no issues                             [facility] Adult PT Treatment/Exercise - 07/31/20 0001      Transfers   Transfers Sit to Stand;Stand to Sit;Stand Pivot Transfers    Sit to Stand 5: Supervision;4: Min guard    Sit to Stand Details Tactile cues for weight shifting;Tactile cues for posture;Tactile cues for placement;Verbal cues for safe use of DME/AE;Manual facilitation for weight shifting;Manual facilitation for placement    Stand to Sit 5: Supervision;4: Min guard    Stand to  Sit Details (indicate cue type and reason) Verbal cues for technique;Verbal cues for precautions/safety    Stand Pivot Transfers 4: Min assist;4: Min guard    Stand Pivot Transfer Details (indicate cue type and reason) trial transfer to left; requires assist and cueing for L foot and L hand placement and weight shifting      Ambulation/Gait   Ambulation/Gait Yes    Ambulation/Gait Assistance 4: Min guard    Ambulation/Gait Assistance Details L posterior ottobak donned; tactile & verbal cueing to decrease recurvatum    Ambulation Distance (Feet) 115 Feet    Assistive device Hemi-walker    Gait Pattern Step-to pattern;Decreased arm swing - left;Decreased arm swing - right;Decreased step length - left;Decreased hip/knee flexion - left;Decreased dorsiflexion - left;Decreased weight shift to left;Decreased stance time - left;Wide base of support    Ambulation Surface Level;Indoor    Gait Comments cues to keep hips/toes facing forward, cues for narrower BOS      Knee/Hip Exercises: Standing   Other Standing Knee Exercises Right LE tap up to 2" step 3x10; L LE tap up on 2" step 3x10 with min A for foot clearance      Knee/Hip Exercises: Seated   Heel Slides AAROM;Left;2 sets;10 reps    Other Seated Knee/Hip Exercises SLR AAROM in  seated x10      Knee/Hip Exercises: Supine   Short Arc [employer];Left;3 sets;10 reps    Heel Slides AAROM;Left;3 sets;10 reps                    PT Short Term Goals - 07/26/20 1024      PT SHORT TERM GOAL #1   Title Patient and caregiver will report walking daily in home for improved household mobility (ALL STGs Due: 07/27/2020)    Baseline continues to report not walking daily, currently walking approx 2-3x/week    Time 3    Period Weeks    Status On-going    Target Date 07/27/20      PT SHORT TERM GOAL #2   Title Patient will improve TUG to <1 min to demonstrate improved functional mobility    Baseline 1 min, 32 secs; 10/28 1 min, 17 secs      Time 3    Period Weeks    Status On-going      PT SHORT TERM GOAL #3   Title Patient will improve gait speed to > 1.0 ft sec to demonstrate improved household mobility    Baseline deferred as unable to walk without assistance    Time 3    Period Weeks    Status Deferred             PT Long Term Goals - 07/06/20 1335      PT LONG TERM GOAL #1   Title Patient will report independence with progressive HEP with caregiver asssistance (ALL LTG's Due: 08/17/20)    Baseline continue to progress HEP    Time 6    Period Weeks    Status New    Target Date 08/17/20      PT LONG TERM GOAL #2   Title Patient will demo ability to ambulate >250 ft with LRAD and CGA to demo improved functional mobility and ambulation within the home    Baseline 14 ft, Min A; 207' with CGA and hemi walker    Time 6    Period Weeks    Status New      PT LONG TERM GOAL #3   Title Patient will demo ability to complete TUG  < 50 seconds to demo improved ambulation and mobility    Baseline 1 min 32 secs    Time 6    Period Weeks    Status Revised      PT LONG TERM GOAL #4   Title Patient will improve gait speed to >/= 1.5 ft/sec to demonstrate improved mobility    Baseline not yet assessed    Time 6    Period Weeks    Status New                 Plan - 07/31/20 1404    Clinical Impression Statement Today's session focused on continuing to increase L LE quad activation in supine, seated, and standing. PT continued to practice gait -- pt tends to shift hips towards the left and require consistent cueing to decrease knee hyperextension. Practiced sit to stand transfer to left; however, pt continues to have difficulty performing this safely without cueing.    Personal Factors and Comorbidities Comorbidity 3+;Past/Current Experience;Time since onset of injury/illness/exacerbation;Transportation;Behavior Pattern    Comorbidities DM, HTN, hx of multiple strokes in last 6 months    Examination-Activity  Limitations Bathing;Bed Mobility;Caring for Others;Carry;Dressing;Hygiene/Grooming;Lift;Reach Overhead;Squat    Examination-Participation Restrictions Cleaning;Community Activity;Driving;Laundry;Yard Work;Occupation;Church;Meal Prep  Stability/Clinical Decision Making Evolving/Moderate complexity    Rehab Potential Good    PT Frequency 2x / week    PT Duration 6 weeks    PT Treatment/Interventions ADLs/Self Care Home Management;Gait training;Stair training;Functional mobility training;Therapeutic activities;Therapeutic exercise;Balance training;Neuromuscular re-education;Manual techniques;Orthotic Fit/Training;Patient/family education;Cognitive remediation;Passive range of motion;Energy conservation;DME Instruction;Electrical Stimulation;Joint Manipulations    PT Next Visit Plan Thayer Ohm from [facility] Present on 11/4 @ 11 am. Have completed gait training with L Post Ottobok with Heel Wedge. I believe patient will need custom AFO. continue activites to promote quad actvitiation. transfer training.    PT Home Exercise Plan seated hip march, bridge, transfer training (bed, commode), PROM L ankle    Consulted and Agree with Plan of Care Patient;Family member/caregiver    Family Member Consulted Aunt           Patient will benefit from skilled therapeutic intervention in order to improve the following deficits and impairments:  Abnormal gait, Decreased activity tolerance, Decreased balance, Decreased cognition, Decreased coordination, Decreased endurance, Decreased knowledge of precautions, Decreased mobility, Decreased range of motion, Decreased safety awareness, Decreased strength, Difficulty walking, Impaired perceived functional ability, Impaired flexibility, Impaired sensation, Impaired tone, Impaired UE functional use, Postural dysfunction, Pain  Visit Diagnosis: Muscle weakness (generalized)  Unsteadiness on feet  Hemiplegia and hemiparesis following other cerebrovascular disease affecting left  non-dominant side (HCC)  Other abnormalities of gait and mobility     Problem List Patient Active Problem List   Diagnosis Date Noted  . Uncontrolled type 2 diabetes mellitus with hyperglycemia, without long-term current use of insulin (HCC) 04/16/2020  . CVA (cerebral vascular accident) (HCC) 04/15/2020  . Cocaine use 04/15/2020  . Acute CVA (cerebrovascular accident) (HCC) 03/04/2020  . Stroke (cerebrum) (HCC) 03/03/2020  . Weakness   . Noncompliance with medication regimen   . TIA (transient ischemic attack) 01/22/2019  . Diabetes mellitus due to underlying condition without complications (HCC) 05/30/2014  . Essential hypertension, benign 05/30/2014  . Smoking 05/30/2014    Gearldean Lomanto April Ma L Henna Derderian PT, DPT 07/31/2020, 2:09 PM  [facility] [address] [place], [place], [address] Phone: [phone]   Fax:  [phone]  Name: Ebony Matthews MRN: [id number] Date of Birth: 1980-12-18

## 2020-08-02 ENCOUNTER — Encounter: Payer: Self-pay | Admitting: Physical Therapy

## 2020-08-02 ENCOUNTER — Encounter: Payer: Self-pay | Admitting: Occupational Therapy

## 2020-08-02 ENCOUNTER — Ambulatory Visit: Payer: Medicaid Other | Admitting: Occupational Therapy

## 2020-08-02 ENCOUNTER — Other Ambulatory Visit: Payer: Self-pay

## 2020-08-02 ENCOUNTER — Ambulatory Visit: Payer: Medicaid Other | Admitting: Physical Therapy

## 2020-08-02 DIAGNOSIS — M6281 Muscle weakness (generalized): Secondary | ICD-10-CM

## 2020-08-02 DIAGNOSIS — R2681 Unsteadiness on feet: Secondary | ICD-10-CM

## 2020-08-02 DIAGNOSIS — M25512 Pain in left shoulder: Secondary | ICD-10-CM

## 2020-08-02 DIAGNOSIS — R41840 Attention and concentration deficit: Secondary | ICD-10-CM

## 2020-08-02 DIAGNOSIS — I69854 Hemiplegia and hemiparesis following other cerebrovascular disease affecting left non-dominant side: Secondary | ICD-10-CM

## 2020-08-02 DIAGNOSIS — R278 Other lack of coordination: Secondary | ICD-10-CM

## 2020-08-02 DIAGNOSIS — M25612 Stiffness of left shoulder, not elsewhere classified: Secondary | ICD-10-CM

## 2020-08-02 NOTE — Therapy (Signed)
[facility] [address] [place], [place], [address] Phone: [phone]   Fax:  [phone]  Occupational Therapy Treatment  Patient Details  Name: Ebony Matthews MRN: [id number] Date of Birth: April 06, 1981 Referring Provider (OT): Dr. Everlena Cooper   Encounter Date: 08/02/2020   OT End of Session - 08/02/20 1227    Visit Number 9    Number of Visits 25    Date for OT Re-Evaluation 09/10/20    Authorization Type UHC    Authorization Time Period VL:MN - auth required after 25th visit    Authorization - Visit Number 9    Authorization - Number of Visits 25    OT Start Time 1148    OT Stop Time 1231    OT Time Calculation (min) 43 min    Equipment Utilized During Treatment gait belt    Activity Tolerance Patient tolerated treatment well    Behavior During Therapy [facility] for tasks assessed/performed;Flat affect           Past Medical History:  Diagnosis Date  . Diabetes mellitus without complication (HCC)   . Hypertension   . TIA (transient ischemic attack) 01/22/2019    Past Surgical History:  Procedure Laterality Date  . NO PAST SURGERIES      There were no vitals filed for this visit.   Subjective Assessment - 08/02/20 1148    Subjective  Pt denies any pain today . Reports hand is feeling better.    Patient is accompanied by: Family member   aunt   Pertinent History PMH of CVA 2020, CVA June 2021 (R cerebellar) HTN, IIDM, HLD, on going cocaine/polysubstance abuse, non-compliant with medications and presented with new onset of left arm weakness and numbness.    Limitations Fall Risk. Not Driving. Left Inattention    Patient Stated Goals "to be able to move my arms" and "to be able to hold on and grip on stuff" with LUE    Currently in Pain? No/denies                        OT Treatments/Exercises (OP) - 08/02/20 1216      Transfers   Sit to Stand 4: Min guard    Comments stand pivot with mod A        ADLs   UB Dressing pt reports increased performing of UB dressing but assistance aunt with mod A    Functional Mobility observed patient and provided mod visual and verbal cues for positoining of LLE with propelling manual wheelchair. Pt does not have leg rests on chair and encouraged to not do self-propulsion as left inattention (although better) is a risk for injury       Visual/Perceptual Exercises   Other Exercises Perfection - no difficulty with dspatial awareness and visual spatial skills and scanning to left      Neurological Re-education Exercises   Weight Bearing Position Standing    Standing with weight shifting on and off weight shifting to left leg with standing. Pt complete sit to stands with mod verbal and tactile cues for neutral body positioning and weight shifting to left. Pt with standing tolerance with min A for standing balance and mod verbal and tactile cues for shifting to left and standing in upright standing position with body in neutral    Development of Reach Towel slides    Towel Slides on table with total A - very trace activation noted. Pt also performed self PROM  with towel slides on table                    OT Short Term Goals - 07/23/20 1509      OT SHORT TERM GOAL #1   Title Pt will be independent with HEP 07/16/2020    Baseline HEP not issued yet    Time 4    Period Weeks    Status On-going    Target Date 07/16/20      OT SHORT TERM GOAL #2   Title Pt will perform UB dressing with min A and implementing hemitechniques and adapted strategies PRN in order to increase independence with ADLs    Baseline max A for UB dressing    Time 4    Period Weeks    Status On-going      OT SHORT TERM GOAL #3   Title Pt will perform bathing with mod A and implementing adapted strategies PRN in order to increase independence with ADLs    Baseline total A currently for all bathing    Time 4    Period Weeks    Status On-going   pt reports she is not  doing it but simulated in clinic     OT SHORT TERM GOAL #4   Title Patient will demonstrate safe positioning and handling for LUE during transfers with min verbal cues 4/5 trials in order to decrease pain and risk of injury.    Baseline currently severe left inattention to LUE    Time 4    Period Weeks    Status On-going      OT SHORT TERM GOAL #5   Title Pt will complete table top scanning with 50 % accuracy in order to increase awareness to left hemisphere.    Baseline 29/121 or approx 24%    Time 4    Period Weeks    Status New      OT SHORT TERM GOAL #6   Title Patient will prepare a light snack with mod A and adapted strategies PRN in order to increase independence with IADLs.    Baseline total A for snack prep    Time 4    Period Weeks    Status New             OT Long Term Goals - 08/02/20 1214      OT LONG TERM GOAL #1   Title Pt will be independent with updated HEP 08/13/20    Baseline not implemented .    Time 12    Period Weeks    Status New      OT LONG TERM GOAL #2   Title Pt will perform UB dressing with supervision and implementing hemitechniques and adapted strategies PRN in order to increase independence with ADLs    Baseline max A for UB dressing    Time 12    Period Weeks    Status New      OT LONG TERM GOAL #3   Title Pt will perform bathing with min A and implementing adapted strategies PRN in order to increase independence with ADLs    Baseline total A for bathing at evaluation    Time 12    Period Weeks    Status New      OT LONG TERM GOAL #4   Title Patient will demonstrate safe positioning and handling for LUE during transfers Independently (with no verbal cues) 4/5 trials in order to decrease pain and risk of  injury.    Baseline currently left inattention to LUE    Time 12    Period Weeks    Status New      OT LONG TERM GOAL #5   Title Pt will complete table top scanning with 75% accuracy in order to increase awareness to left  hemisphere.    Baseline 24% accuracy -29/121 double digit cancellation    Time 12    Period Weeks    Status Achieved      OT LONG TERM GOAL #6   Title Patient will prepare a light snack with min A and adapted strategies PRN in order to increase independence with IADLs.    Baseline not preparing snacks    Time 12    Period Weeks    Status New      OT LONG TERM GOAL #7   Title Pt will utilize LUE as a stabilizer with no more than 2 verbal cues in order to increase independence and use of LUE.    Time 12    Period Weeks    Status New                 Plan - 08/02/20 1320    Clinical Impression Statement Pt is reporting increased participation in UB dressing at home however progress is limited with self-care and increasing independence at home. Pt with limited (trace) activation in LUE and minimal gains made with NMR. Pt is working on sitting and standing in midline for increase in stability and standing balance for performing more ADLs    OT Occupational Profile and History Detailed Assessment- Review of Records and additional review of physical, cognitive, psychosocial history related to current functional performance    Occupational performance deficits (Please refer to evaluation for details): IADL's;ADL's;Rest and Sleep;Education    Body Structure / Function / Physical Skills ADL;Strength;Gait;Decreased knowledge of use of DME;Balance;Pain;UE functional use;GMC;Dexterity;Endurance;IADL;ROM;Vision;Coordination;Flexibility;Mobility;Sensation;FMC;Decreased knowledge of precautions    Cognitive Skills Attention;Safety Awareness;Problem Solve;Understand;Sequencing    Rehab Potential Good    Clinical Decision Making Several treatment options, min-mod task modification necessary    Comorbidities Affecting Occupational Performance: May have comorbidities impacting occupational performance    Modification or Assistance to Complete Evaluation  Min-Moderate modification of tasks or assist  with assess necessary to complete eval    OT Frequency 2x / week    OT Duration 12 weeks    OT Treatment/Interventions Self-care/ADL training;Moist Heat;DME and/or AE instruction;Splinting;Balance training;Fluidtherapy;Gait Training;Therapeutic activities;Therapeutic exercise;Ultrasound;Electrical Stimulation;Energy conservation;Manual Therapy;Patient/family education;Visual/perceptual remediation/compensation;Passive range of motion;Functional Mobility Training;Neuromuscular education    Plan trunk and standing balance - NMR    Consulted and Agree with Plan of Care Patient;Family member/caregiver    Family Member Consulted Aunt           Patient will benefit from skilled therapeutic intervention in order to improve the following deficits and impairments:   Body Structure / Function / Physical Skills: ADL, Strength, Gait, Decreased knowledge of use of DME, Balance, Pain, UE functional use, GMC, Dexterity, Endurance, IADL, ROM, Vision, Coordination, Flexibility, Mobility, Sensation, FMC, Decreased knowledge of precautions Cognitive Skills: Attention, Safety Awareness, Problem Solve, Understand, Sequencing     Visit Diagnosis: Muscle weakness (generalized)  Unsteadiness on feet  Hemiplegia and hemiparesis following other cerebrovascular disease affecting left non-dominant side (HCC)  Acute pain of left shoulder  Attention and concentration deficit  Stiffness of left shoulder, not elsewhere classified  Other lack of coordination    Problem List Patient Active Problem List   Diagnosis Date Noted  .  Uncontrolled type 2 diabetes mellitus with hyperglycemia, without long-term current use of insulin (HCC) 04/16/2020  . CVA (cerebral vascular accident) (HCC) 04/15/2020  . Cocaine use 04/15/2020  . Acute CVA (cerebrovascular accident) (HCC) 03/04/2020  . Stroke (cerebrum) (HCC) 03/03/2020  . Weakness   . Noncompliance with medication regimen   . TIA (transient ischemic attack)  01/22/2019  . Diabetes mellitus due to underlying condition without complications (HCC) 05/30/2014  . Essential hypertension, benign 05/30/2014  . Smoking 05/30/2014    Junious DresserKirstyn M Eladia Frame MOT, OTR/L  08/02/2020, 1:22 PM  [facility] [address] [place], [place], [address] Phone: [phone]   Fax:  [phone]  Name: Avis EpleyRuby Matthews MRN: [id number] Date of Birth: Jan 09, 1981

## 2020-08-03 NOTE — Therapy (Signed)
[facility] [address] [place], [place], [address] Phone: [phone]   Fax:  [phone]  Physical Therapy Treatment  Patient Details  Name: Margia Wiesen MRN: [id number] Date of Birth: 1981-04-28 Referring Provider (PT): Referred by: Georgiann Cocker, MD. Followed by Shon Millet, MD   Encounter Date: 08/02/2020   PT End of Session - 08/02/20 1107    Visit Number 24    Number of Visits 28    Date for PT Re-Evaluation 10/04/20    Authorization Type UHC    PT Start Time 1103    PT Stop Time 1144    PT Time Calculation (min) 41 min    Equipment Utilized During Treatment Gait belt;Other (comment)   posterior Thusane with heel wedge   Activity Tolerance Patient tolerated treatment well    Behavior During Therapy WFL for tasks assessed/performed;Flat affect           Past Medical History:  Diagnosis Date  . Diabetes mellitus without complication (HCC)   . Hypertension   . TIA (transient ischemic attack) 01/22/2019    Past Surgical History:  Procedure Laterality Date  . NO PAST SURGERIES      There were no vitals filed for this visit.   Subjective Assessment - 08/02/20 1106    Subjective Pt reports no new changes or complaints    Patient is accompained by: Family member   aunt [name]   Pertinent History hx of multiple CVA, DM, HTN, drug abuse    Limitations Standing;Walking;House hold activities;Lifting    How long can you sit comfortably? no issues    How long can you walk comfortably? <10 feet with quad cane    Patient Stated Goals Get my balance better.    Currently in Pain? No/denies                  [facility] Adult PT Treatment/Exercise - 08/02/20 1107      Transfers   Transfers Sit to Stand;Stand to Sit    Sit to Stand 5: Supervision;4: Min guard    Sit to Stand Details Verbal cues for sequencing;Verbal cues for technique;Verbal cues for safe use of DME/AE;Manual facilitation for weight  shifting;Manual facilitation for placement    Sit to Stand Details (indicate cue type and reason) cues for left LE placement and weight shifting with standing up. needs UE support upon standing for balance.     Stand to Sit 5: Supervision;4: Min guard;With upper extremity assist;To chair/3-in-1    Stand to Sit Details (indicate cue type and reason) Verbal cues for sequencing;Verbal cues for technique;Verbal cues for safe use of DME/AE    Stand to Sit Details cues to turn/back all the way to the surface prior to sitting donn.    Stand Pivot Transfers 4: Min assist    Stand Pivot Transfer Details (indicate cue type and reason) pt performed 2 90* turns in session. with first one pt had hemiwalker next to chair where she could not advance it. pt attempted to lift walker over chair to position it in front of chair needing up to mod assist for balance. with pt holding onto chair arm PTA moved hemiwalker into better position. pt then needed cues on sequencing/technique to turn to sit safely into the chair. On 2cd one pt was cued to walk so that the hemiwalker was in front of chair and at half way point (hemiwalker at middle of chair) then start to turn to sit. directional cues needed with this.  Ambulation/Gait   Ambulation/Gait Yes    Ambulation/Gait Assistance 4: Min guard    Ambulation/Gait Assistance Details trailed Thusane posterior brace with continued recurvatum. added heel wedge with continued recurvatum. pt also noted to have decreased hip/knee flexion with swing phase and tends to take large steps using tone to propel LE forward with circumduction. with cues to shorten her left step this mildly improved. Discussed keeping left foot back with heel on floor or as close to floor as possible to stretch     Ambulation Distance (Feet) 50 Feet   x2   Assistive device Hemi-walker    Gait Pattern Step-to pattern;Decreased arm swing - left;Decreased arm swing - right;Decreased step length -  left;Decreased hip/knee flexion - left;Decreased dorsiflexion - left;Decreased weight shift to left;Decreased stance time - left;Wide base of support    Ambulation Surface Level;Indoor                PT Short Term Goals - 07/26/20 1024      PT SHORT TERM GOAL #1   Title Patient and caregiver will report walking daily in home for improved household mobility (ALL STGs Due: 07/27/2020)    Baseline continues to report not walking daily, currently walking approx 2-3x/week    Time 3    Period Weeks    Status On-going    Target Date 07/27/20      PT SHORT TERM GOAL #2   Title Patient will improve TUG to <1 min to demonstrate improved functional mobility    Baseline 1 min, 32 secs; 10/28 1 min, 17 secs    Time 3    Period Weeks    Status On-going      PT SHORT TERM GOAL #3   Title Patient will improve gait speed to > 1.0 ft sec to demonstrate improved household mobility    Baseline deferred as unable to walk without assistance    Time 3    Period Weeks    Status Deferred             PT Long Term Goals - 07/06/20 1335      PT LONG TERM GOAL #1   Title Patient will report independence with progressive HEP with caregiver asssistance (ALL LTG's Due: 08/17/20)    Baseline continue to progress HEP    Time 6    Period Weeks    Status New    Target Date 08/17/20      PT LONG TERM GOAL #2   Title Patient will demo ability to ambulate >250 ft with LRAD and CGA to demo improved functional mobility and ambulation within the home    Baseline 14 ft, Min A; 207' with CGA and hemi walker    Time 6    Period Weeks    Status New      PT LONG TERM GOAL #3   Title Patient will demo ability to complete TUG  < 50 seconds to demo improved ambulation and mobility    Baseline 1 min 32 secs    Time 6    Period Weeks    Status Revised      PT LONG TERM GOAL #4   Title Patient will improve gait speed to >/= 1.5 ft/sec to demonstrate improved mobility    Baseline not yet assessed     Time 6    Period Weeks    Status New                 Plan - 08/02/20  1107    Clinical Impression Statement Today's skilled session focused on trial of braces along side consult with Thayer Ohm from [facility]. Best brace to use in clinic for now is the posterior Thusane with heel wedge. Pt is going to need a custom plastic brace with dorsi assist hinge, posterior stop block and strap over foot to lock her into the brace. The pt is to set up an appt with [employer] for moding to be done. Pt noted to be very tight in the heel cords as well. The pt is to work on keeping her foot under her with hips/knees at 90* to work on gentle stretching and apply pressure a knee at times for more aggressive heel cord stretching. The pt is making progress toward goals and should benefit from continued PT to progress toward unmet goals.    Personal Factors and Comorbidities Comorbidity 3+;Past/Current Experience;Time since onset of injury/illness/exacerbation;Transportation;Behavior Pattern    Comorbidities DM, HTN, hx of multiple strokes in last 6 months    Examination-Activity Limitations Bathing;Bed Mobility;Caring for Others;Carry;Dressing;Hygiene/Grooming;Lift;Reach Overhead;Squat    Examination-Participation Restrictions Cleaning;Community Activity;Driving;Laundry;Yard Work;Occupation;Church;Meal Prep    Stability/Clinical Decision Making Evolving/Moderate complexity    Rehab Potential Good    PT Frequency 2x / week    PT Duration 6 weeks    PT Treatment/Interventions ADLs/Self Care Home Management;Gait training;Stair training;Functional mobility training;Therapeutic activities;Therapeutic exercise;Balance training;Neuromuscular re-education;Manual techniques;Orthotic Fit/Training;Patient/family education;Cognitive remediation;Passive range of motion;Energy conservation;DME Instruction;Electrical Stimulation;Joint Manipulations    PT Next Visit Plan continue activites to promote quad actvitiation. transfer  training. continue with use of posterior Thusane with heel wedge (wedge is on desk between [place] and [employer])    PT Home Exercise Plan seated hip march, bridge, transfer training (bed, commode), PROM L ankle    Consulted and Agree with Plan of Care Patient;Family member/caregiver    Family Member Consulted Aunt           Patient will benefit from skilled therapeutic intervention in order to improve the following deficits and impairments:  Abnormal gait, Decreased activity tolerance, Decreased balance, Decreased cognition, Decreased coordination, Decreased endurance, Decreased knowledge of precautions, Decreased mobility, Decreased range of motion, Decreased safety awareness, Decreased strength, Difficulty walking, Impaired perceived functional ability, Impaired flexibility, Impaired sensation, Impaired tone, Impaired UE functional use, Postural dysfunction, Pain  Visit Diagnosis: Muscle weakness (generalized)  Unsteadiness on feet  Hemiplegia and hemiparesis following other cerebrovascular disease affecting left non-dominant side [facility])     Problem List Patient Active Problem List   Diagnosis Date Noted  . Uncontrolled type 2 diabetes mellitus with hyperglycemia, without long-term current use of insulin (HCC) 04/16/2020  . CVA (cerebral vascular accident) (HCC) 04/15/2020  . Cocaine use 04/15/2020  . Acute CVA (cerebrovascular accident) (HCC) 03/04/2020  . Stroke (cerebrum) (HCC) 03/03/2020  . Weakness   . Noncompliance with medication regimen   . TIA (transient ischemic attack) 01/22/2019  . Diabetes mellitus due to underlying condition without complications (HCC) 05/30/2014  . Essential hypertension, benign 05/30/2014  . Smoking 05/30/2014    Sallyanne Kuster, PTA, [facility] [address], [address] [place], [place] [address] [phone] 08/03/20, 12:23 PM   Name: Cailin Gebel MRN: [id number] Date of Birth: 1981/08/14

## 2020-08-07 ENCOUNTER — Ambulatory Visit: Payer: Medicaid Other

## 2020-08-07 ENCOUNTER — Ambulatory Visit: Payer: Medicaid Other | Admitting: Occupational Therapy

## 2020-08-07 ENCOUNTER — Encounter: Payer: Self-pay | Admitting: Occupational Therapy

## 2020-08-07 ENCOUNTER — Other Ambulatory Visit: Payer: Self-pay

## 2020-08-07 DIAGNOSIS — M6281 Muscle weakness (generalized): Secondary | ICD-10-CM

## 2020-08-07 DIAGNOSIS — R2689 Other abnormalities of gait and mobility: Secondary | ICD-10-CM

## 2020-08-07 DIAGNOSIS — R2681 Unsteadiness on feet: Secondary | ICD-10-CM

## 2020-08-07 DIAGNOSIS — R278 Other lack of coordination: Secondary | ICD-10-CM

## 2020-08-07 DIAGNOSIS — M25612 Stiffness of left shoulder, not elsewhere classified: Secondary | ICD-10-CM

## 2020-08-07 DIAGNOSIS — R41840 Attention and concentration deficit: Secondary | ICD-10-CM

## 2020-08-07 DIAGNOSIS — I69854 Hemiplegia and hemiparesis following other cerebrovascular disease affecting left non-dominant side: Secondary | ICD-10-CM

## 2020-08-07 NOTE — Therapy (Signed)
[facility] [address] [place], [place], [address] Phone: [phone]   Fax:  [phone]  Occupational Therapy Treatment  Patient Details  Name: Ebony Matthews MRN: [id number] Date of Birth: 1981/05/18 Referring Provider (OT): Dr. Everlena Cooper   Encounter Date: 08/07/2020   OT End of Session - 08/07/20 1103    Visit Number 10    Number of Visits 25    Date for OT Re-Evaluation 09/10/20    Authorization Type UHC    Authorization Time Period VL:MN - auth required after 25th visit    Authorization - Visit Number 10    Authorization - Number of Visits 25    OT Start Time 1102    OT Stop Time 1145    OT Time Calculation (min) 43 min    Equipment Utilized During Treatment gait belt    Activity Tolerance Patient tolerated treatment well    Behavior During Therapy [facility] for tasks assessed/performed;Flat affect           Past Medical History:  Diagnosis Date   Diabetes mellitus without complication (HCC)    Hypertension    TIA (transient ischemic attack) 01/22/2019    Past Surgical History:  Procedure Laterality Date   NO PAST SURGERIES      There were no vitals filed for this visit.   Subjective Assessment - 08/07/20 1105    Subjective  Pt denies any pain. Pt reports not changes or falls since last visit.    Patient is accompanied by: Family member   aunt   Pertinent History PMH of CVA 2020, CVA June 2021 (R cerebellar) HTN, IIDM, HLD, on going cocaine/polysubstance abuse, non-compliant with medications and presented with new onset of left arm weakness and numbness.    Limitations Fall Risk. Not Driving. Left Inattention    Patient Stated Goals "to be able to move my arms" and "to be able to hold on and grip on stuff" with LUE    Currently in Pain? No/denies           Treatment:  Standing with weight shifting bilaterally with emphasis on increasing weight bearing to LLE for standing balance. Pt  trnasferred with max verbal cues and tactile cues for sequencing transfer (stand pivot w/c>mat)  Bed Mobility sit to supine with max A for transitioning on left side  Pt demonstrated self-PROM shoulder flexion HEP with mod A for tactile and verbal cues for positioning and not going too far behind head.  Stool shoulder flexion/development of reach with total A. Very limited activation.  Weight bearing in LUE on mat while seated edge of mat with total A for maintaining hand position on mat. Patient with reach diagonally for clothespins and reaching to right on elevated surface for trunk lateral flexion and reach while weight bearing.                          OT Short Term Goals - 08/07/20 1117      OT SHORT TERM GOAL #1   Title Pt will be independent with HEP 07/16/2020    Baseline HEP not issued yet    Time 4    Period Weeks    Status On-going    Target Date 07/16/20      OT SHORT TERM GOAL #2   Title Pt will perform UB dressing with min A and implementing hemitechniques and adapted strategies PRN in order to increase independence with ADLs    Baseline  max A for UB dressing    Time 4    Period Weeks    Status On-going   mod  A 11/9     OT SHORT TERM GOAL #3   Title Pt will perform bathing with mod A and implementing adapted strategies PRN in order to increase independence with ADLs    Baseline total A currently for all bathing    Time 4    Period Weeks    Status On-going   pt reports she is not doing it but simulated in clinic     OT SHORT TERM GOAL #4   Title Patient will demonstrate safe positioning and handling for LUE during transfers with min verbal cues 4/5 trials in order to decrease pain and risk of injury.    Baseline currently severe left inattention to LUE    Time 4    Period Weeks    Status On-going      OT SHORT TERM GOAL #5   Title Pt will complete table top scanning with 50 % accuracy in order to increase awareness to left hemisphere.     Baseline 29/121 or approx 24%    Time 4    Period Weeks    Status Achieved      OT SHORT TERM GOAL #6   Title Patient will prepare a light snack with mod A and adapted strategies PRN in order to increase independence with IADLs.    Baseline total A for snack prep    Time 4    Period Weeks    Status New             OT Long Term Goals - 08/02/20 1214      OT LONG TERM GOAL #1   Title Pt will be independent with updated HEP 08/13/20    Baseline not implemented .    Time 12    Period Weeks    Status New      OT LONG TERM GOAL #2   Title Pt will perform UB dressing with supervision and implementing hemitechniques and adapted strategies PRN in order to increase independence with ADLs    Baseline max A for UB dressing    Time 12    Period Weeks    Status New      OT LONG TERM GOAL #3   Title Pt will perform bathing with min A and implementing adapted strategies PRN in order to increase independence with ADLs    Baseline total A for bathing at evaluation    Time 12    Period Weeks    Status New      OT LONG TERM GOAL #4   Title Patient will demonstrate safe positioning and handling for LUE during transfers Independently (with no verbal cues) 4/5 trials in order to decrease pain and risk of injury.    Baseline currently left inattention to LUE    Time 12    Period Weeks    Status New      OT LONG TERM GOAL #5   Title Pt will complete table top scanning with 75% accuracy in order to increase awareness to left hemisphere.    Baseline 24% accuracy -29/121 double digit cancellation    Time 12    Period Weeks    Status Achieved      OT LONG TERM GOAL #6   Title Patient will prepare a light snack with min A and adapted strategies PRN in order to increase independence with IADLs.  Baseline not preparing snacks    Time 12    Period Weeks    Status New      OT LONG TERM GOAL #7   Title Pt will utilize LUE as a stabilizer with no more than 2 verbal cues in order to  increase independence and use of LUE.    Time 12    Period Weeks    Status New                 Plan - 08/07/20 1133    Clinical Impression Statement Pt with limited carry over with self-care and participation in ADLs at home.    OT Occupational Profile and History Detailed Assessment- Review of Records and additional review of physical, cognitive, psychosocial history related to current functional performance    Occupational performance deficits (Please refer to evaluation for details): IADL's;ADL's;Rest and Sleep;Education    Body Structure / Function / Physical Skills ADL;Strength;Gait;Decreased knowledge of use of DME;Balance;Pain;UE functional use;GMC;Dexterity;Endurance;IADL;ROM;Vision;Coordination;Flexibility;Mobility;Sensation;FMC;Decreased knowledge of precautions    Cognitive Skills Attention;Safety Awareness;Problem Solve;Understand;Sequencing    Rehab Potential Good    Clinical Decision Making Several treatment options, min-mod task modification necessary    Comorbidities Affecting Occupational Performance: May have comorbidities impacting occupational performance    Modification or Assistance to Complete Evaluation  Min-Moderate modification of tasks or assist with assess necessary to complete eval    OT Frequency 2x / week    OT Duration 12 weeks    OT Treatment/Interventions Self-care/ADL training;Moist Heat;DME and/or AE instruction;Splinting;Balance training;Fluidtherapy;Gait Training;Therapeutic activities;Therapeutic exercise;Ultrasound;Electrical Stimulation;Energy conservation;Manual Therapy;Patient/family education;Visual/perceptual remediation/compensation;Passive range of motion;Functional Mobility Training;Neuromuscular education    Plan NMR LUE, left inattention    Consulted and Agree with Plan of Care Patient;Family member/caregiver    Family Member Consulted Aunt           Patient will benefit from skilled therapeutic intervention in order to improve the  following deficits and impairments:   Body Structure / Function / Physical Skills: ADL, Strength, Gait, Decreased knowledge of use of DME, Balance, Pain, UE functional use, GMC, Dexterity, Endurance, IADL, ROM, Vision, Coordination, Flexibility, Mobility, Sensation, FMC, Decreased knowledge of precautions Cognitive Skills: Attention, Safety Awareness, Problem Solve, Understand, Sequencing     Visit Diagnosis: Muscle weakness (generalized)  Attention and concentration deficit  Other lack of coordination  Stiffness of left shoulder, not elsewhere classified  Unsteadiness on feet  Other abnormalities of gait and mobility    Problem List Patient Active Problem List   Diagnosis Date Noted   Uncontrolled type 2 diabetes mellitus with hyperglycemia, without long-term current use of insulin (HCC) 04/16/2020   CVA (cerebral vascular accident) (HCC) 04/15/2020   Cocaine use 04/15/2020   Acute CVA (cerebrovascular accident) (HCC) 03/04/2020   Stroke (cerebrum) (HCC) 03/03/2020   Weakness    Noncompliance with medication regimen    TIA (transient ischemic attack) 01/22/2019   Diabetes mellitus due to underlying condition without complications (HCC) 05/30/2014   Essential hypertension, benign 05/30/2014   Smoking 05/30/2014    Junious Dresser MOT, OTR/L  08/07/2020, 1:24 PM  [facility] [address] [place], [place], [address] Phone: [phone]   Fax:  [phone]  Name: Aanya Haynes MRN: [id number] Date of Birth: 1981/01/13

## 2020-08-07 NOTE — Therapy (Signed)
[facility] [address] [place], [place], [address] Phone: [phone]   Fax:  [phone]  Physical Therapy Treatment  Patient Details  Name: Ebony Matthews MRN: [id number] Date of Birth: 05/07/1981 Referring Provider (PT): Referred by: Georgiann Cocker, MD. Followed by Shon Millet, MD   Encounter Date: 08/07/2020   PT End of Session - 08/07/20 1020    Visit Number 25    Number of Visits 28    Date for PT Re-Evaluation 10/04/20    Authorization Type U[employer]    PT Start Time 1017    PT Stop Time 1100    PT Time Calculation (min) 43 min    Equipment Utilized During Treatment Gait belt;Other (comment)   posterior Thusane with heel wedge   Activity Tolerance Patient tolerated treatment well    Behavior During Therapy WFL for tasks assessed/performed;Flat affect           Past Medical History:  Diagnosis Date  . Diabetes mellitus without complication (HCC)   . Hypertension   . TIA (transient ischemic attack) 01/22/2019    Past Surgical History:  Procedure Laterality Date  . NO PAST SURGERIES      There were no vitals filed for this visit.   Subjective Assessment - 08/07/20 1020    Subjective Patient reports no new changes/complaints. Has appt with [facility] Scheduled for this Friday. No pain or falls to report.    Patient is accompained by: Family member   aunt [name]   Pertinent History hx of multiple CVA, DM, HTN, drug abuse    Limitations Standing;Walking;House hold activities;Lifting    How long can you sit comfortably? no issues    How long can you walk comfortably? <10 feet with quad cane    Patient Stated Goals Get my balance better.    Currently in Pain? No/denies                      [facility] Adult PT Treatment/Exercise - 08/07/20 0001      Transfers   Transfers Sit to Stand;Stand to Sit    Sit to Stand 4: Min guard    Sit to Stand Details Verbal cues for sequencing;Verbal cues for  technique;Verbal cues for safe use of DME/AE;Manual facilitation for weight shifting;Manual facilitation for placement    Stand to Sit 4: Min guard    Stand to Sit Details (indicate cue type and reason) Verbal cues for sequencing;Verbal cues for technique;Verbal cues for safe use of DME/AE    Stand Pivot Transfers 4: Min guard;4: Min assist    Stand Pivot Transfer Details (indicate cue type and reason) completed transfer from w/c <> mat x 3 reps with 5-6 steps between working on sequencing transfer/turn and ensuring alignment prior to descent. Pt demo increased difficulty iwth sequencing the pivot with hemi-walker requiring max verbal/tactile cues from PT for proper completion.       Ambulation/Gait   Ambulation/Gait Yes    Ambulation/Gait Assistance 4: Min guard    Ambulation/Gait Assistance Details PT donned L Thu[place]ne with heel wedge to LLE. Completed ambulation x 230 ft with AFO donned and hemi-walker. Demo improvements noted in knee recurvatum, continue to demo hip/knee flexion weakness and compensation with large steps. PT providing min A to avoid circumduction and help with proper placement of LLE.     Ambulation Distance (Feet) 230 Feet    Assistive device Hemi-walker    Gait Pattern Step-to pattern;Decreased arm swing - left;Decreased arm swing -  right;Decreased step length - left;Decreased hip/knee flexion - left;Decreased dorsiflexion - left;Decreased weight shift to left;Decreased stance time - left;Wide base of support    Ambulation Surface Level;Indoor      Exercises   Exercises Other Exercises;Knee/Hip    Other Exercises  Completed manual stretch to L gastroc, 4 x 30 secs progresing into resistance as tolerated by patient. In seated position, completed heel slides x 10 reps with LLE placed on towel, verbal/tactile cues required.                     PT Short Term Goals - 07/26/20 1024      PT SHORT TERM GOAL #1   Title Patient and caregiver will report walking daily in  home for improved household mobility (ALL STGs Due: 07/27/2020)    Baseline continues to report not walking daily, currently walking approx 2-3x/week    Time 3    Period Weeks    Status On-going    Target Date 07/27/20      PT SHORT TERM GOAL #2   Title Patient will improve TUG to <1 min to demonstrate improved functional mobility    Baseline 1 min, 32 secs; 10/28 1 min, 17 secs    Time 3    Period Weeks    Status On-going      PT SHORT TERM GOAL #3   Title Patient will improve gait speed to > 1.0 ft sec to demonstrate improved household mobility    Baseline deferred as unable to walk without assistance    Time 3    Period Weeks    Status Deferred             PT Long Term Goals - 07/06/20 1335      PT LONG TERM GOAL #1   Title Patient will report independence with progressive HEP with caregiver asssistance (ALL LTG's Due: 08/17/20)    Baseline continue to progress HEP    Time 6    Period Weeks    Status New    Target Date 08/17/20      PT LONG TERM GOAL #2   Title Patient will demo ability to ambulate >250 ft with LRAD and CGA to demo improved functional mobility and ambulation within the home    Baseline 14 ft, Min A; 207' with CGA and hemi walker    Time 6    Period Weeks    Status New      PT LONG TERM GOAL #3   Title Patient will demo ability to complete TUG  < 50 seconds to demo improved ambulation and mobility    Baseline 1 min 32 secs    Time 6    Period Weeks    Status Revised      PT LONG TERM GOAL #4   Title Patient will improve gait speed to >/= 1.5 ft/sec to demonstrate improved mobility    Baseline not yet assessed    Time 6    Period Weeks    Status New                 Plan - 08/07/20 1333    Clinical Impression Statement Continued gait training today with L posterior thusane with heel wedge to allow for continued ambulation and work on transfer training to promote proper use of hemi-walker. Patient continue to demo difficulty with  completing turns properly requiring max verbal cues and min A. Completed stretching to L gastroc/heel cord and continued exercises to  promote active motion in LLE. Will continue to progress toward all goals.    Personal Factors and Comorbidities Comorbidity 3+;Past/Current Experience;Time since onset of injury/illness/exacerbation;Transportation;Behavior Pattern    Comorbidities DM, HTN, hx of multiple strokes in last 6 months    Examination-Activity Limitations Bathing;Bed Mobility;Caring for Others;Carry;Dressing;Hygiene/Grooming;Lift;Reach Overhead;Squat    Examination-Participation Restrictions Cleaning;Community Activity;Driving;Laundry;Yard Work;Occupation;Church;Meal Prep    Stability/Clinical Decision Making Evolving/Moderate complexity    Rehab Potential Good    PT Frequency 2x / week    PT Duration 6 weeks    PT Treatment/Interventions ADLs/Self Care Home Management;Gait training;Stair training;Functional mobility training;Therapeutic activities;Therapeutic exercise;Balance training;Neuromuscular re-education;Manual techniques;Orthotic Fit/Training;Patient/family education;Cognitive remediation;Passive range of motion;Energy conservation;DME Instruction;Electrical Stimulation;Joint Manipulations    PT Next Visit Plan Provide address for [facility] at next visit. continue activites to promote quad actvitiation. transfer training. continue with use of posterior Thusane with heel wedge (wedge is on desk between [place] and [employer])    PT Home Exercise Plan seated hip march, bridge, transfer training (bed, commode), PROM L ankle    Consulted and Agree with Plan of Care Patient;Family member/caregiver    Family Member Consulted Aunt           Patient will benefit from skilled therapeutic intervention in order to improve the following deficits and impairments:  Abnormal gait, Decreased activity tolerance, Decreased balance, Decreased cognition, Decreased coordination, Decreased  endurance, Decreased knowledge of precautions, Decreased mobility, Decreased range of motion, Decreased safety awareness, Decreased strength, Difficulty walking, Impaired perceived functional ability, Impaired flexibility, Impaired sensation, Impaired tone, Impaired UE functional use, Postural dysfunction, Pain  Visit Diagnosis: Muscle weakness (generalized)  Unsteadiness on feet  Hemiplegia and hemiparesis following other cerebrovascular disease affecting left non-dominant side (HCC)  Other abnormalities of gait and mobility     Problem List Patient Active Problem List   Diagnosis Date Noted  . Uncontrolled type 2 diabetes mellitus with hyperglycemia, without long-term current use of insulin (HCC) 04/16/2020  . CVA (cerebral vascular accident) (HCC) 04/15/2020  . Cocaine use 04/15/2020  . Acute CVA (cerebrovascular accident) (HCC) 03/04/2020  . Stroke (cerebrum) (HCC) 03/03/2020  . Weakness   . Noncompliance with medication regimen   . TIA (transient ischemic attack) 01/22/2019  . Diabetes mellitus due to underlying condition without complications (HCC) 05/30/2014  . Essential hypertension, benign 05/30/2014  . Smoking 05/30/2014    Tempie Donning, PT, DPT 08/07/2020, 1:38 PM  [facility] [address] [place], [place], [address] Phone: [phone]   Fax:  [phone]  Name: Larenda Reedy MRN: [id number] Date of Birth: [birth year]

## 2020-08-09 ENCOUNTER — Encounter: Payer: Self-pay | Admitting: Occupational Therapy

## 2020-08-09 ENCOUNTER — Ambulatory Visit: Payer: Medicaid Other | Admitting: Occupational Therapy

## 2020-08-09 ENCOUNTER — Other Ambulatory Visit: Payer: Self-pay

## 2020-08-09 ENCOUNTER — Ambulatory Visit: Payer: Medicaid Other

## 2020-08-09 DIAGNOSIS — M6281 Muscle weakness (generalized): Secondary | ICD-10-CM

## 2020-08-09 DIAGNOSIS — R278 Other lack of coordination: Secondary | ICD-10-CM

## 2020-08-09 DIAGNOSIS — I69854 Hemiplegia and hemiparesis following other cerebrovascular disease affecting left non-dominant side: Secondary | ICD-10-CM

## 2020-08-09 DIAGNOSIS — R2681 Unsteadiness on feet: Secondary | ICD-10-CM

## 2020-08-09 DIAGNOSIS — R2689 Other abnormalities of gait and mobility: Secondary | ICD-10-CM

## 2020-08-09 DIAGNOSIS — M25672 Stiffness of left ankle, not elsewhere classified: Secondary | ICD-10-CM

## 2020-08-09 DIAGNOSIS — M25612 Stiffness of left shoulder, not elsewhere classified: Secondary | ICD-10-CM

## 2020-08-09 DIAGNOSIS — M25512 Pain in left shoulder: Secondary | ICD-10-CM

## 2020-08-09 DIAGNOSIS — R41840 Attention and concentration deficit: Secondary | ICD-10-CM

## 2020-08-09 NOTE — Therapy (Signed)
[facility] [address] [place], [place], [address] Phone: [phone]   Fax:  [phone]  Physical Therapy Treatment  Patient Details  Name: Ebony Matthews MRN: [id number] Date of Birth: 1981/09/03 Referring Provider (PT): Referred by: Georgiann Cocker, MD. Followed by Shon Millet, MD   Encounter Date: 08/09/2020   PT End of Session - 08/09/20 1020    Visit Number 26    Number of Visits 28    Date for PT Re-Evaluation 10/04/20    Authorization Type UHC    PT Start Time 1017    PT Stop Time 1100    PT Time Calculation (min) 43 min    Equipment Utilized During Treatment Gait belt;Other (comment)   posterior Thusane with heel wedge   Activity Tolerance Patient tolerated treatment well    Behavior During Therapy [facility] for tasks assessed/performed;Flat affect           Past Medical History:  Diagnosis Date   Diabetes mellitus without complication (HCC)    Hypertension    TIA (transient ischemic attack) 01/22/2019    Past Surgical History:  Procedure Laterality Date   NO PAST SURGERIES      There were no vitals filed for this visit.   Subjective Assessment - 08/09/20 1020    Subjective Patient reports no falls. Aunt requesting information including address/phone number of [facility]. No pain.    Patient is accompained by: Family member   aunt Ebony Matthews   Pertinent History hx of multiple CVA, DM, HTN, drug abuse    Limitations Standing;Walking;House hold activities;Lifting    How long can you sit comfortably? no issues    How long can you walk comfortably? <10 feet with quad cane    Patient Stated Goals Get my balance better.    Currently in Pain? No/denies                 [facility] Adult PT Treatment/Exercise - 08/09/20 0001      Transfers   Transfers Sit to Stand;Stand to Sit    Sit to Stand 4: Min guard    Sit to Stand Details Verbal cues for sequencing;Verbal cues for technique;Verbal cues for  safe use of DME/AE;Manual facilitation for weight shifting;Manual facilitation for placement    Stand to Sit 4: Min guard    Stand to Sit Details (indicate cue type and reason) Verbal cues for sequencing;Verbal cues for technique;Verbal cues for safe use of DME/AE    Stand Pivot Transfers 4: Min guard;4: Min assist    Stand Pivot Transfer Details (indicate cue type and reason) completed transfer from w/c <> mat x 2 reps, verbal cues for sequencing and turns with completion of transfer.     Comments continue to require min A for placement intermittently for LLE with transfers as patient tends to place LLE posteriorly. Completed sit <> stands x 5 reps with faciliation for posture/form.       Ambulation/Gait   Ambulation/Gait Yes    Ambulation/Gait Assistance 4: Min guard    Ambulation/Gait Assistance Details PT donned L Thuasne with heel wedge to LLE. Completed ambulation x 245 ft with hemi-walker, PT providing faciliation at pelvis for neutral alignment as patient tends to turn/shift body to L side. Patient able to maintain neutral alignment for 3-4 steps and then returns back to baseline.     Ambulation Distance (Feet) 245 Feet    Assistive device Hemi-walker    Gait Pattern Step-to pattern;Decreased arm swing - left;Decreased arm swing -  right;Decreased step length - left;Decreased hip/knee flexion - left;Decreased dorsiflexion - left;Decreased weight shift to left;Decreased stance time - left;Wide base of support    Ambulation Surface Level;Indoor      Neuro Re-ed    Neuro Re-ed Details  Completed standing without UE support reaching for pegs and across midline to place on board, completed x 10 minutes with reaching from various directions. PT continue to provide tactile/verbal cues and faciliation for improved posture as well as to maintain knee extension during stance. Intermittent rest breaks required. Completed with addition of cognitive task to mimic patterned sheet provided.                PT Education - 08/09/20 1023    Education Details Educated on Technical sales engineer Address/Phone Number    Person(s) Educated Patient    Methods Explanation;Handout    Comprehension Verbalized understanding            PT Short Term Goals - 07/26/20 1024      PT SHORT TERM GOAL #1   Title Patient and caregiver will report walking daily in home for improved household mobility (ALL STGs Due: 07/27/2020)    Baseline continues to report not walking daily, currently walking approx 2-3x/week    Time 3    Period Weeks    Status On-going    Target Date 07/27/20      PT SHORT TERM GOAL #2   Title Patient will improve TUG to <1 min to demonstrate improved functional mobility    Baseline 1 min, 32 secs; 10/28 1 min, 17 secs    Time 3    Period Weeks    Status On-going      PT SHORT TERM GOAL #3   Title Patient will improve gait speed to > 1.0 ft sec to demonstrate improved household mobility    Baseline deferred as unable to walk without assistance    Time 3    Period Weeks    Status Deferred             PT Long Term Goals - 07/06/20 1335      PT LONG TERM GOAL #1   Title Patient will report independence with progressive HEP with caregiver asssistance (ALL LTG's Due: 08/17/20)    Baseline continue to progress HEP    Time 6    Period Weeks    Status New    Target Date 08/17/20      PT LONG TERM GOAL #2   Title Patient will demo ability to ambulate >250 ft with LRAD and CGA to demo improved functional mobility and ambulation within the home    Baseline 14 ft, Min A; 207' with CGA and hemi walker    Time 6    Period Weeks    Status New      PT LONG TERM GOAL #3   Title Patient will demo ability to complete TUG  < 50 seconds to demo improved ambulation and mobility    Baseline 1 min 32 secs    Time 6    Period Weeks    Status Revised      PT LONG TERM GOAL #4   Title Patient will improve gait speed to >/= 1.5 ft/sec to demonstrate improved mobility    Baseline not yet  assessed    Time 6    Period Weeks    Status New                 Plan - 08/09/20 1158  Clinical Impression Statement Continued transfer training and gait training with focus on improved manual faciliation for neutral alignment, patient continue to require Min A throughout. Completed balance activity with reaching across midline without UE support with cogntiive task added to completion. Patient has scheduled appt with [facility] tmrw. Will continue per POC.    Personal Factors and Comorbidities Comorbidity 3+;Past/Current Experience;Time since onset of injury/illness/exacerbation;Transportation;Behavior Pattern    Comorbidities DM, HTN, hx of multiple strokes in last 6 months    Examination-Activity Limitations Bathing;Bed Mobility;Caring for Others;Carry;Dressing;Hygiene/Grooming;Lift;Reach Overhead;Squat    Examination-Participation Restrictions Cleaning;Community Activity;Driving;Laundry;Yard Work;Occupation;Church;Meal Prep    Stability/Clinical Decision Making Evolving/Moderate complexity    Rehab Potential Good    PT Frequency 2x / week    PT Duration 6 weeks    PT Treatment/Interventions ADLs/Self Care Home Management;Gait training;Stair training;Functional mobility training;Therapeutic activities;Therapeutic exercise;Balance training;Neuromuscular re-education;Manual techniques;Orthotic Fit/Training;Patient/family education;Cognitive remediation;Passive range of motion;Energy conservation;DME Instruction;Electrical Stimulation;Joint Manipulations    PT Next Visit Plan How was [facility] appt? continue activites to promote quad actvitiation. transfer training. continue with use of posterior Thusane with heel wedge (wedge is on desk between [place] and [employer])    PT Home Exercise Plan seated hip march, bridge, transfer training (bed, commode), PROM L ankle    Consulted and Agree with Plan of Care Patient;Family member/caregiver    Family Member Consulted Aunt            Patient will benefit from skilled therapeutic intervention in order to improve the following deficits and impairments:  Abnormal gait, Decreased activity tolerance, Decreased balance, Decreased cognition, Decreased coordination, Decreased endurance, Decreased knowledge of precautions, Decreased mobility, Decreased range of motion, Decreased safety awareness, Decreased strength, Difficulty walking, Impaired perceived functional ability, Impaired flexibility, Impaired sensation, Impaired tone, Impaired UE functional use, Postural dysfunction, Pain  Visit Diagnosis: Muscle weakness (generalized)  Unsteadiness on feet  Hemiplegia and hemiparesis following other cerebrovascular disease affecting left non-dominant side (HCC)  Other abnormalities of gait and mobility  Stiffness of left ankle, not elsewhere classified     Problem List Patient Active Problem List   Diagnosis Date Noted   Uncontrolled type 2 diabetes mellitus with hyperglycemia, without long-term current use of insulin (HCC) 04/16/2020   CVA (cerebral vascular accident) (HCC) 04/15/2020   Cocaine use 04/15/2020   Acute CVA (cerebrovascular accident) (HCC) 03/04/2020   Stroke (cerebrum) (HCC) 03/03/2020   Weakness    Noncompliance with medication regimen    TIA (transient ischemic attack) 01/22/2019   Diabetes mellitus due to underlying condition without complications (HCC) 05/30/2014   Essential hypertension, benign 05/30/2014   Smoking 05/30/2014    Tempie Donning, PT, DPT 08/09/2020, 12:07 PM  [facility] [address] [place], [place], [address] Phone: [phone]   Fax:  [phone]  Name: Ebony Matthews MRN: [id number] Date of Birth: [birth year]

## 2020-08-09 NOTE — Therapy (Addendum)
[name] [address] [place], [place], [address] Phone: [phone]   Fax:  [phone]  Occupational Therapy Treatment  Patient Details  Name: Ebony Matthews MRN: [id number] Date of Birth: 24-Feb-1981 Referring Provider (OT): Dr. Everlena Cooper   Encounter Date: 08/09/2020   OT End of Session - 08/09/20 1103    Visit Number 11    Number of Visits 25    Date for OT Re-Evaluation 09/10/20    Authorization Type UHC    Authorization Time Period VL:MN - auth required after 25th visit    Authorization - Visit Number 11    Authorization - Number of Visits 25    OT Start Time 1101    OT Stop Time 1145    OT Time Calculation (min) 44 min    Equipment Utilized During Treatment gait belt    Activity Tolerance Patient tolerated treatment well    Behavior During Therapy [facility] for tasks assessed/performed;Flat affect           Past Medical History:  Diagnosis Date  . Diabetes mellitus without complication (HCC)   . Hypertension   . TIA (transient ischemic attack) 01/22/2019    Past Surgical History:  Procedure Laterality Date  . NO PAST SURGERIES      There were no vitals filed for this visit.   Subjective Assessment - 08/09/20 1108    Subjective  Pt denies any pain. Pt reports no changes or falls since last visit.    Patient is accompanied by: Family member   aunt   Pertinent History PMH of CVA 2020, CVA June 2021 (R cerebellar) HTN, IIDM, HLD, on going cocaine/polysubstance abuse, non-compliant with medications and presented with new onset of left arm weakness and numbness.    Limitations Fall Risk. Not Driving. Left Inattention    Patient Stated Goals "to be able to move my arms" and "to be able to hold on and grip on stuff" with LUE    Currently in Pain? No/denies                        OT Treatments/Exercises (OP) - 08/09/20 1140      Transfers   Transfers Sit to Stand;Stand to Sit    Sit to  Stand Details (indicate cue type and reason) cueing for left UE and left LE and for attending working on midline positioning      Visual/Perceptual Exercises   Scanning Tabletop    Scanning - Tabletop inattention noted to left and right with table top scanning double numbers. Pt started with just attending to numbers in midline of paper and then moved to laterally.       Neurological Re-education Exercises   Other Exercises 1 UE Ranger with LUE in side lying and in sitting for increase in normal movement patterns with LUE and working on facilitation of active movement in LUE. Patient with active movement noted with scap retraction, protraction, external rotation and internal rotation. Pt with difficulty with tricep activation and elbow extension. Pt required max to total A for maintaining positioning and movement patterns                    OT Short Term Goals - 08/09/20 1108      OT SHORT TERM GOAL #1   Title Pt will be independent with HEP 07/16/2020    Baseline HEP not issued yet    Time 4    Period Weeks  Status On-going    Target Date 07/16/20      OT SHORT TERM GOAL #2   Title Pt will perform UB dressing with min A and implementing hemitechniques and adapted strategies PRN in order to increase independence with ADLs    Baseline max A for UB dressing    Time 4    Period Weeks    Status On-going   mod  A 11/9     OT SHORT TERM GOAL #3   Title Pt will perform bathing with mod A and implementing adapted strategies PRN in order to increase independence with ADLs    Baseline total A currently for all bathing    Time 4    Period Weeks    Status On-going   pt reports she is not doing it but simulated in clinic     OT SHORT TERM GOAL #4   Title Patient will demonstrate safe positioning and handling for LUE during transfers with min verbal cues 4/5 trials in order to decrease pain and risk of injury.    Baseline currently severe left inattention to LUE    Time 4     Period Weeks    Status On-going      OT SHORT TERM GOAL #5   Title Pt will complete table top scanning with 50 % accuracy in order to increase awareness to left hemisphere.    Baseline 29/121 or approx 24%    Time 4    Period Weeks    Status Achieved      OT SHORT TERM GOAL #6   Title Patient will prepare a light snack with mod A and adapted strategies PRN in order to increase independence with IADLs.    Baseline total A for snack prep    Time 4    Period Weeks    Status Deferred             OT Long Term Goals - 08/02/20 1214      OT LONG TERM GOAL #1   Title Pt will be independent with updated HEP 08/13/20    Baseline not implemented .    Time 12    Period Weeks    Status New      OT LONG TERM GOAL #2   Title Pt will perform UB dressing with supervision and implementing hemitechniques and adapted strategies PRN in order to increase independence with ADLs    Baseline max A for UB dressing    Time 12    Period Weeks    Status New      OT LONG TERM GOAL #3   Title Pt will perform bathing with min A and implementing adapted strategies PRN in order to increase independence with ADLs    Baseline total A for bathing at evaluation    Time 12    Period Weeks    Status New      OT LONG TERM GOAL #4   Title Patient will demonstrate safe positioning and handling for LUE during transfers Independently (with no verbal cues) 4/5 trials in order to decrease pain and risk of injury.    Baseline currently left inattention to LUE    Time 12    Period Weeks    Status New      OT LONG TERM GOAL #5   Title Pt will complete table top scanning with 75% accuracy in order to increase awareness to left hemisphere.    Baseline 24% accuracy -29/121 double digit cancellation  Time 12    Period Weeks    Status Achieved      OT LONG TERM GOAL #6   Title Patient will prepare a light snack with min A and adapted strategies PRN in order to increase independence with IADLs.    Baseline  not preparing snacks    Time 12    Period Weeks    Status New      OT LONG TERM GOAL #7   Title Pt will utilize LUE as a stabilizer with no more than 2 verbal cues in order to increase independence and use of LUE.    Time 12    Period Weeks    Status New                Plan -     Clinical Impression Statement Pt with limited carry over with self-care and participation in ADLs at home. Small trace activation noted in LUE.    OT Occupational Profile and History Detailed Assessment- Review of Records and additional review of physical, cognitive, psychosocial history related to current functional performance    Occupational performance deficits (Please refer to evaluation for details): IADL's;ADL's;Rest and Sleep;Education    Body Structure / Function / Physical Skills ADL;Strength;Gait;Decreased knowledge of use of DME;Balance;Pain;UE functional use;GMC;Dexterity;Endurance;IADL;ROM;Vision;Coordination;Flexibility;Mobility;Sensation;FMC;Decreased knowledge of precautions    Cognitive Skills Attention;Safety Awareness;Problem Solve;Understand;Sequencing    Rehab Potential Good    Clinical Decision Making Several treatment options, min-mod task modification necessary    Comorbidities Affecting Occupational Performance: May have comorbidities impacting occupational performance    Modification or Assistance to Complete Evaluation  Min-Moderate modification of tasks or assist with assess necessary to complete eval    OT Frequency 2x / week    OT Duration 12 weeks    OT Treatment/Interventions Self-care/ADL training;Moist Heat;DME and/or AE instruction;Splinting;Balance training;Fluidtherapy;Gait Training;Therapeutic activities;Therapeutic exercise;Ultrasound;Electrical Stimulation;Energy conservation;Manual Therapy;Patient/family education;Visual/perceptual remediation/compensation;Passive range of motion;Functional Mobility Training;Neuromuscular education    Plan NMR LUE, left inattention     Consulted and Agree with Plan of Care Patient;Family member/caregiver    Family Member Consulted Aunt              Visit Diagnosis: Lack of Coordination Muscle Weakness     Problem List Patient Active Problem List   Diagnosis Date Noted  . Uncontrolled type 2 diabetes mellitus with hyperglycemia, without long-term current use of insulin (HCC) 04/16/2020  . CVA (cerebral vascular accident) (HCC) 04/15/2020  . Cocaine use 04/15/2020  . Acute CVA (cerebrovascular accident) (HCC) 03/04/2020  . Stroke (cerebrum) (HCC) 03/03/2020  . Weakness   . Noncompliance with medication regimen   . TIA (transient ischemic attack) 01/22/2019  . Diabetes mellitus due to underlying condition without complications (HCC) 05/30/2014  . Essential hypertension, benign 05/30/2014  . Smoking 05/30/2014    Junious Dresser MOT, OTR/L  08/09/2020, 11:44 AM  [facility] [address] [place], [place], [address] Phone: [phone]   Fax:  [phone]  Name: Tanielle Emigh MRN: [id number] Date of Birth: [birth year]

## 2020-08-14 ENCOUNTER — Ambulatory Visit: Payer: Medicaid Other | Admitting: Occupational Therapy

## 2020-08-14 ENCOUNTER — Other Ambulatory Visit: Payer: Self-pay

## 2020-08-14 ENCOUNTER — Ambulatory Visit: Payer: Medicaid Other

## 2020-08-14 DIAGNOSIS — M6281 Muscle weakness (generalized): Secondary | ICD-10-CM

## 2020-08-14 DIAGNOSIS — I69854 Hemiplegia and hemiparesis following other cerebrovascular disease affecting left non-dominant side: Secondary | ICD-10-CM

## 2020-08-14 DIAGNOSIS — M25612 Stiffness of left shoulder, not elsewhere classified: Secondary | ICD-10-CM

## 2020-08-14 DIAGNOSIS — R41840 Attention and concentration deficit: Secondary | ICD-10-CM

## 2020-08-14 DIAGNOSIS — R2681 Unsteadiness on feet: Secondary | ICD-10-CM

## 2020-08-14 DIAGNOSIS — R2689 Other abnormalities of gait and mobility: Secondary | ICD-10-CM

## 2020-08-14 DIAGNOSIS — R278 Other lack of coordination: Secondary | ICD-10-CM

## 2020-08-14 DIAGNOSIS — M25512 Pain in left shoulder: Secondary | ICD-10-CM

## 2020-08-14 NOTE — Therapy (Signed)
[facility] [address] [place], [place], [address] Phone: [phone]   Fax:  [phone]  Physical Therapy Treatment  Patient Details  Name: Ebony Matthews MRN: [id number] Date of Birth: 1981/07/25 Referring Provider (PT): Referred by: Georgiann Cocker, MD. Followed by Shon Millet, MD   Encounter Date: 08/14/2020   PT End of Session - 08/14/20 1020    Visit Number 27    Number of Visits 28    Date for PT Re-Evaluation 10/04/20    Authorization Type UHC    PT Start Time 1016    PT Stop Time 1100    PT Time Calculation (min) 44 min    Equipment Utilized During Treatment Gait belt;Other (comment)   posterior Thusane with heel wedge   Activity Tolerance Patient tolerated treatment well    Behavior During Therapy WFL for tasks assessed/performed;Flat affect           Past Medical History:  Diagnosis Date  . Diabetes mellitus without complication (HCC)   . Hypertension   . TIA (transient ischemic attack) 01/22/2019    Past Surgical History:  Procedure Laterality Date  . NO PAST SURGERIES      There were no vitals filed for this visit.   Subjective Assessment - 08/14/20 1026    Subjective Patient reports no new changes. Appt with [employer] went well. No pain. No falls. Had a good weekend.    Patient is accompained by: Family member   aunt [name]   Pertinent History hx of multiple CVA, DM, HTN, drug abuse    Limitations Standing;Walking;House hold activities;Lifting    How long can you sit comfortably? no issues    How long can you walk comfortably? <10 feet with quad cane    Patient Stated Goals Get my balance better.    Currently in Pain? No/denies                 [facility] Adult PT Treatment/Exercise - 08/14/20 0001      Transfers   Transfers Sit to Stand;Stand to Sit    Sit to Stand 4: Min guard    Sit to Stand Details Verbal cues for sequencing;Verbal cues for technique;Verbal cues for safe use of  DME/AE;Manual facilitation for weight shifting;Manual facilitation for placement    Sit to Stand Details (indicate cue type and reason) patient continue to require vebral cues for LLE positioning prior to completion    Stand to Sit 4: Min guard    Stand to Sit Details (indicate cue type and reason) Verbal cues for sequencing;Verbal cues for technique;Verbal cues for safe use of DME/AE    Stand Pivot Transfers 4: Min guard      Ambulation/Gait   Ambulation/Gait Yes    Ambulation/Gait Assistance 4: Min guard    Ambulation/Gait Assistance Details PT donned L Post Thuasne Brace with Heel wedge prior to ambulation activiites. Completed ambulation between various surfaces mat, w/c, working on ambulating up to surface and completing turn with hemiwalker to allow for improved transfer ability. Pt requiring increased verbal cues for sequencing for proper turn, occasional assistance with LLE placement. improvements noted with increased repetition. Completed ambulation x 115 ft with hemiwalker, PT providing tactile cues to avoid circumduction to promtoe improved hip/knee flexion on LLE with ambulation.     Ambulation Distance (Feet) 20 Feet   x 5, 115 x 1   Assistive device Hemi-walker    Gait Pattern Step-to pattern;Decreased arm swing - left;Decreased arm swing - right;Decreased step length -  left;Decreased hip/knee flexion - left;Decreased dorsiflexion - left;Decreased weight shift to left;Decreased stance time - left;Wide base of support    Ambulation Surface Level;Indoor      Neuro Re-ed    Neuro Re-ed Details  Completed half sit <> stand with 3 second hold and controlled descent x 10 reps to promote improved BLE strengthening, PT providing faciliation for improved weight shift to LLE with competion and verbal cues for technique. Standing with single UE support on hemiwalker, completed forward steps with RLE to target x 15 reps to promote improved weight shift to LLE. Attempted without UE support,  increased challenge noted.                   PT Education - 08/14/20 1102    Education Details Educated on going on hold for 2 weeks to obtain personal AFO    Person(s) Educated Patient;Other (comment)   Aunt   Methods Explanation    Comprehension Verbalized understanding            PT Short Term Goals - 07/26/20 1024      PT SHORT TERM GOAL #1   Title Patient and caregiver will report walking daily in home for improved household mobility (ALL STGs Due: 07/27/2020)    Baseline continues to report not walking daily, currently walking approx 2-3x/week    Time 3    Period Weeks    Status On-going    Target Date 07/27/20      PT SHORT TERM GOAL #2   Title Patient will improve TUG to <1 min to demonstrate improved functional mobility    Baseline 1 min, 32 secs; 10/28 1 min, 17 secs    Time 3    Period Weeks    Status On-going      PT SHORT TERM GOAL #3   Title Patient will improve gait speed to > 1.0 ft sec to demonstrate improved household mobility    Baseline deferred as unable to walk without assistance    Time 3    Period Weeks    Status Deferred             PT Long Term Goals - 07/06/20 1335      PT LONG TERM GOAL #1   Title Patient will report independence with progressive HEP with caregiver asssistance (ALL LTG's Due: 08/17/20)    Baseline continue to progress HEP    Time 6    Period Weeks    Status New    Target Date 08/17/20      PT LONG TERM GOAL #2   Title Patient will demo ability to ambulate >250 ft with LRAD and CGA to demo improved functional mobility and ambulation within the home    Baseline 14 ft, Min A; 207' with CGA and hemi walker    Time 6    Period Weeks    Status New      PT LONG TERM GOAL #3   Title Patient will demo ability to complete TUG  < 50 seconds to demo improved ambulation and mobility    Baseline 1 min 32 secs    Time 6    Period Weeks    Status Revised      PT LONG TERM GOAL #4   Title Patient will improve gait  speed to >/= 1.5 ft/sec to demonstrate improved mobility    Baseline not yet assessed    Time 6    Period Weeks    Status New  Plan - 08/14/20 1248    Clinical Impression Statement Continued activites to promote improved weight shift and strengthening of LLE with patient tolerating well. Patient continues to require intermittent verbal/tactile cues for midline alignment with activites, as well as CGA with ambulation. After thursday's visit, patient will be placed on hold for two weeks to obtain personal AFO. Continue per POC.    Personal Factors and Comorbidities Comorbidity 3+;Past/Current Experience;Time since onset of injury/illness/exacerbation;Transportation;Behavior Pattern    Comorbidities DM, HTN, hx of multiple strokes in last 6 months    Examination-Activity Limitations Bathing;Bed Mobility;Caring for Others;Carry;Dressing;Hygiene/Grooming;Lift;Reach Overhead;Squat    Examination-Participation Restrictions Cleaning;Community Activity;Driving;Laundry;Yard Work;Occupation;Church;Meal Prep    Stability/Clinical Decision Making Evolving/Moderate complexity    Rehab Potential Good    PT Frequency 2x / week    PT Duration 6 weeks    PT Treatment/Interventions ADLs/Self Care Home Management;Gait training;Stair training;Functional mobility training;Therapeutic activities;Therapeutic exercise;Balance training;Neuromuscular re-education;Manual techniques;Orthotic Fit/Training;Patient/family education;Cognitive remediation;Passive range of motion;Energy conservation;DME Instruction;Electrical Stimulation;Joint Manipulations    PT Next Visit Plan patient will be placed on hold after 11/18 visit (2 weeks to obtain AFO). continue activites to promote quad actvitiation. transfer training. continue with use of posterior Thusane with heel wedge (wedge is on desk between [place] and [employer])    PT Home Exercise Plan seated hip march, bridge, transfer training (bed, commode),  PROM L ankle    Consulted and Agree with Plan of Care Patient;Family member/caregiver    Family Member Consulted Aunt           Patient will benefit from skilled therapeutic intervention in order to improve the following deficits and impairments:  Abnormal gait, Decreased activity tolerance, Decreased balance, Decreased cognition, Decreased coordination, Decreased endurance, Decreased knowledge of precautions, Decreased mobility, Decreased range of motion, Decreased safety awareness, Decreased strength, Difficulty walking, Impaired perceived functional ability, Impaired flexibility, Impaired sensation, Impaired tone, Impaired UE functional use, Postural dysfunction, Pain  Visit Diagnosis: Muscle weakness (generalized)  Unsteadiness on feet  Other abnormalities of gait and mobility  Hemiplegia and hemiparesis following other cerebrovascular disease affecting left non-dominant side [facility])     Problem List Patient Active Problem List   Diagnosis Date Noted  . Uncontrolled type 2 diabetes mellitus with hyperglycemia, without long-term current use of insulin (HCC) 04/16/2020  . CVA (cerebral vascular accident) (HCC) 04/15/2020  . Cocaine use 04/15/2020  . Acute CVA (cerebrovascular accident) (HCC) 03/04/2020  . Stroke (cerebrum) (HCC) 03/03/2020  . Weakness   . Noncompliance with medication regimen   . TIA (transient ischemic attack) 01/22/2019  . Diabetes mellitus due to underlying condition without complications (HCC) 05/30/2014  . Essential hypertension, benign 05/30/2014  . Smoking 05/30/2014    Tempie Donning, PT, DPT 08/14/2020, 12:51 PM  [facility] [address] [place], [place], [address] Phone: [phone]   Fax:  [phone]  Name: Ebony Matthews MRN: [id number] Date of Birth: 1981/01/27

## 2020-08-14 NOTE — Therapy (Signed)
[facility] [address] [place], [place], [address] Phone: [phone]   Fax:  [phone]  Occupational Therapy Treatment  Patient Details  Name: Ebony Matthews MRN: [id number] Date of Birth: Feb 10, 1981 Referring Provider (OT): Dr. Everlena Cooper   Encounter Date: 08/14/2020   OT End of Session - 08/14/20 1101    Visit Number 12    Number of Visits 25    Date for OT Re-Evaluation 09/10/20    Authorization Type UHC    Authorization Time Period VL:MN - auth required after 25th visit    Authorization - Visit Number 12    Authorization - Number of Visits 25    OT Start Time 1101    OT Stop Time 1139    OT Time Calculation (min) 38 min    Equipment Utilized During Treatment gait belt    Activity Tolerance Patient tolerated treatment well    Behavior During Therapy [facility] for tasks assessed/performed;Flat affect           Past Medical History:  Diagnosis Date  . Diabetes mellitus without complication (HCC)   . Hypertension   . TIA (transient ischemic attack) 01/22/2019    Past Surgical History:  Procedure Laterality Date  . NO PAST SURGERIES      There were no vitals filed for this visit.    Treatment:  Pt with weight bearing exercises into LUE hand and forearm for facilitation of muscles and neuromuscular reeducation. Pt with reaching with RUE with weight bearing in LUE in different planes and across midline.   Self PROM of LUE with reaching down to floor and with pool noodle (max facilitation and assistance with maintaining LUE grasp on pool noodle)  Sit to stand with reaching to table with cones for increase in standing tolerance, sit to stand and functional mobility. Pt with min guard for sit to stands this day.  Transfers completed this day with min A                        OT Short Term Goals - 08/09/20 1108      OT SHORT TERM GOAL #1   Title Pt will be independent with HEP  07/16/2020    Baseline HEP not issued yet    Time 4    Period Weeks    Status On-going    Target Date 07/16/20      OT SHORT TERM GOAL #2   Title Pt will perform UB dressing with min A and implementing hemitechniques and adapted strategies PRN in order to increase independence with ADLs    Baseline max A for UB dressing    Time 4    Period Weeks    Status On-going   mod  A 11/9     OT SHORT TERM GOAL #3   Title Pt will perform bathing with mod A and implementing adapted strategies PRN in order to increase independence with ADLs    Baseline total A currently for all bathing    Time 4    Period Weeks    Status On-going   pt reports she is not doing it but simulated in clinic     OT SHORT TERM GOAL #4   Title Patient will demonstrate safe positioning and handling for LUE during transfers with min verbal cues 4/5 trials in order to decrease pain and risk of injury.    Baseline currently severe left inattention to LUE    Time 4  Period Weeks    Status On-going      OT SHORT TERM GOAL #5   Title Pt will complete table top scanning with 50 % accuracy in order to increase awareness to left hemisphere.    Baseline 29/121 or approx 24%    Time 4    Period Weeks    Status Achieved      OT SHORT TERM GOAL #6   Title Patient will prepare a light snack with mod A and adapted strategies PRN in order to increase independence with IADLs.    Baseline total A for snack prep    Time 4    Period Weeks    Status Deferred             OT Long Term Goals - 08/02/20 1214      OT LONG TERM GOAL #1   Title Pt will be independent with updated HEP 08/13/20    Baseline not implemented .    Time 12    Period Weeks    Status New      OT LONG TERM GOAL #2   Title Pt will perform UB dressing with supervision and implementing hemitechniques and adapted strategies PRN in order to increase independence with ADLs    Baseline max A for UB dressing    Time 12    Period Weeks    Status New        OT LONG TERM GOAL #3   Title Pt will perform bathing with min A and implementing adapted strategies PRN in order to increase independence with ADLs    Baseline total A for bathing at evaluation    Time 12    Period Weeks    Status New      OT LONG TERM GOAL #4   Title Patient will demonstrate safe positioning and handling for LUE during transfers Independently (with no verbal cues) 4/5 trials in order to decrease pain and risk of injury.    Baseline currently left inattention to LUE    Time 12    Period Weeks    Status New      OT LONG TERM GOAL #5   Title Pt will complete table top scanning with 75% accuracy in order to increase awareness to left hemisphere.    Baseline 24% accuracy -29/121 double digit cancellation    Time 12    Period Weeks    Status Achieved      OT LONG TERM GOAL #6   Title Patient will prepare a light snack with min A and adapted strategies PRN in order to increase independence with IADLs.    Baseline not preparing snacks    Time 12    Period Weeks    Status New      OT LONG TERM GOAL #7   Title Pt will utilize LUE as a stabilizer with no more than 2 verbal cues in order to increase independence and use of LUE.    Time 12    Period Weeks    Status New                 Plan - 08/14/20 1105    Clinical Impression Statement Pt with limited progress at this time. Pt is going on hold for PT and OT for a few weeks until receive AFO brace for ambulation. Will get back in for therapy after receiving brace to assess standing balance and increasing independence with ADLs.    OT Occupational Profile and History  Detailed Assessment- Review of Records and additional review of physical, cognitive, psychosocial history related to current functional performance    Occupational performance deficits (Please refer to evaluation for details): IADL's;ADL's;Rest and Sleep;Education    Body Structure / Function / Physical Skills ADL;Strength;Gait;Decreased knowledge  of use of DME;Balance;Pain;UE functional use;GMC;Dexterity;Endurance;IADL;ROM;Vision;Coordination;Flexibility;Mobility;Sensation;FMC;Decreased knowledge of precautions    Cognitive Skills Attention;Safety Awareness;Problem Solve;Understand;Sequencing    Rehab Potential Good    Clinical Decision Making Several treatment options, min-mod task modification necessary    Comorbidities Affecting Occupational Performance: May have comorbidities impacting occupational performance    Modification or Assistance to Complete Evaluation  Min-Moderate modification of tasks or assist with assess necessary to complete eval    OT Frequency 2x / week    OT Duration 12 weeks    OT Treatment/Interventions Self-care/ADL training;Moist Heat;DME and/or AE instruction;Splinting;Balance training;Fluidtherapy;Gait Training;Therapeutic activities;Therapeutic exercise;Ultrasound;Electrical Stimulation;Energy conservation;Manual Therapy;Patient/family education;Visual/perceptual remediation/compensation;Passive range of motion;Functional Mobility Training;Neuromuscular education    Plan on hold after 11/18 session until 12/6    Consulted and Agree with Plan of Care Patient;Family member/caregiver    Family Member Consulted Aunt           Patient will benefit from skilled therapeutic intervention in order to improve the following deficits and impairments:   Body Structure / Function / Physical Skills: ADL, Strength, Gait, Decreased knowledge of use of DME, Balance, Pain, UE functional use, GMC, Dexterity, Endurance, IADL, ROM, Vision, Coordination, Flexibility, Mobility, Sensation, FMC, Decreased knowledge of precautions Cognitive Skills: Attention, Safety Awareness, Problem Solve, Understand, Sequencing     Visit Diagnosis: Muscle weakness (generalized)  Acute pain of left shoulder  Stiffness of left shoulder, not elsewhere classified  Other lack of coordination  Attention and concentration deficit    Problem  List Patient Active Problem List   Diagnosis Date Noted  . Uncontrolled type 2 diabetes mellitus with hyperglycemia, without long-term current use of insulin (HCC) 04/16/2020  . CVA (cerebral vascular accident) (HCC) 04/15/2020  . Cocaine use 04/15/2020  . Acute CVA (cerebrovascular accident) (HCC) 03/04/2020  . Stroke (cerebrum) (HCC) 03/03/2020  . Weakness   . Noncompliance with medication regimen   . TIA (transient ischemic attack) 01/22/2019  . Diabetes mellitus due to underlying condition without complications (HCC) 05/30/2014  . Essential hypertension, benign 05/30/2014  . Smoking 05/30/2014    Junious Dresser MOT, OTR/L  08/14/2020, 12:44 PM  [facility] [address] [place], [place], [address] Phone: [phone]   Fax:  [phone]  Name: Syan Cullimore MRN: [id number] Date of Birth: 01/14/1981

## 2020-08-16 ENCOUNTER — Encounter: Payer: Self-pay | Admitting: Physical Therapy

## 2020-08-16 ENCOUNTER — Ambulatory Visit: Payer: Medicaid Other | Admitting: Physical Therapy

## 2020-08-16 ENCOUNTER — Ambulatory Visit: Payer: Medicaid Other | Admitting: Occupational Therapy

## 2020-08-16 ENCOUNTER — Encounter: Payer: Self-pay | Admitting: Occupational Therapy

## 2020-08-16 ENCOUNTER — Other Ambulatory Visit: Payer: Self-pay

## 2020-08-16 DIAGNOSIS — M6281 Muscle weakness (generalized): Secondary | ICD-10-CM

## 2020-08-16 DIAGNOSIS — M25512 Pain in left shoulder: Secondary | ICD-10-CM

## 2020-08-16 DIAGNOSIS — I69854 Hemiplegia and hemiparesis following other cerebrovascular disease affecting left non-dominant side: Secondary | ICD-10-CM

## 2020-08-16 DIAGNOSIS — R2689 Other abnormalities of gait and mobility: Secondary | ICD-10-CM

## 2020-08-16 DIAGNOSIS — R41840 Attention and concentration deficit: Secondary | ICD-10-CM

## 2020-08-16 DIAGNOSIS — M25612 Stiffness of left shoulder, not elsewhere classified: Secondary | ICD-10-CM

## 2020-08-16 DIAGNOSIS — R278 Other lack of coordination: Secondary | ICD-10-CM

## 2020-08-16 NOTE — Therapy (Signed)
[facility] [address] [place], [place], [address] Phone: [phone]   Fax:  [phone]  Occupational Therapy Treatment  Patient Details  Name: Ebony Matthews MRN: [id number] Date of Birth: September 06, 1981 Referring Provider (OT): Dr. Everlena Cooper   Encounter Date: 08/16/2020   OT End of Session - 08/16/20 1104    Visit Number 13    Number of Visits 25    Date for OT Re-Evaluation 09/10/20    Authorization Type UHC    Authorization Time Period VL:MN - auth required after 25th visit    Authorization - Visit Number 13    Authorization - Number of Visits 25    OT Start Time 1103    OT Stop Time 1142    OT Time Calculation (min) 39 min    Equipment Utilized During Treatment gait belt    Activity Tolerance Patient tolerated treatment well    Behavior During Therapy [facility] for tasks assessed/performed;Flat affect           Past Medical History:  Diagnosis Date  . Diabetes mellitus without complication (HCC)   . Hypertension   . TIA (transient ischemic attack) 01/22/2019    Past Surgical History:  Procedure Laterality Date  . NO PAST SURGERIES      There were no vitals filed for this visit.   Subjective Assessment - 08/16/20 1438    Subjective  Pt denies any pain. Pt is agreeable to going on hold until Dec 7th.    Patient is accompanied by: Family member   aunt   Pertinent History PMH of CVA 2020, CVA June 2021 (R cerebellar) HTN, IIDM, HLD, on going cocaine/polysubstance abuse, non-compliant with medications and presented with new onset of left arm weakness and numbness.    Limitations Fall Risk. Not Driving. Left Inattention    Patient Stated Goals "to be able to move my arms" and "to be able to hold on and grip on stuff" with LUE    Currently in Pain? No/denies             Treatment:  Lateral weight shifting while seated edge of mat with weight bearing in LUE. X 10. Req'd total A for maintaining LUE hand  position on mat   Pt completed lateral flexion and weight bearing into shoulder/forearm on LUE while placing and removing semi circle pegs in board with RUE.   Sidelying on Left for proprioception and weightbearing with PROM to LUE hand and wrist.   Pt simulated bathing from edge of mat with supervision and mod A. Patient able to complete approx 50% in sitting with RUE.   Donned jacket with mod A. Pt required assistance for threading LUE and for pulling jacket down in back and zipping.   Pt going on hold secondary to waiting for AFO to arrive for PT and OT. Pt will resume therapy starting Dec 7th.                  OT Short Term Goals - 08/09/20 1108      OT SHORT TERM GOAL #1   Title Pt will be independent with HEP 07/16/2020    Baseline HEP not issued yet    Time 4    Period Weeks    Status On-going    Target Date 07/16/20      OT SHORT TERM GOAL #2   Title Pt will perform UB dressing with min A and implementing hemitechniques and adapted strategies PRN in order to increase independence  with ADLs    Baseline max A for UB dressing    Time 4    Period Weeks    Status On-going   mod  A 11/9     OT SHORT TERM GOAL #3   Title Pt will perform bathing with mod A and implementing adapted strategies PRN in order to increase independence with ADLs    Baseline total A currently for all bathing    Time 4    Period Weeks    Status On-going   pt reports she is not doing it but simulated in clinic     OT SHORT TERM GOAL #4   Title Patient will demonstrate safe positioning and handling for LUE during transfers with min verbal cues 4/5 trials in order to decrease pain and risk of injury.    Baseline currently severe left inattention to LUE    Time 4    Period Weeks    Status On-going      OT SHORT TERM GOAL #5   Title Pt will complete table top scanning with 50 % accuracy in order to increase awareness to left hemisphere.    Baseline 29/121 or approx 24%    Time 4     Period Weeks    Status Achieved      OT SHORT TERM GOAL #6   Title Patient will prepare a light snack with mod A and adapted strategies PRN in order to increase independence with IADLs.    Baseline total A for snack prep    Time 4    Period Weeks    Status Deferred             OT Long Term Goals - 08/02/20 1214      OT LONG TERM GOAL #1   Title Pt will be independent with updated HEP 08/13/20    Baseline not implemented .    Time 12    Period Weeks    Status New      OT LONG TERM GOAL #2   Title Pt will perform UB dressing with supervision and implementing hemitechniques and adapted strategies PRN in order to increase independence with ADLs    Baseline max A for UB dressing    Time 12    Period Weeks    Status New      OT LONG TERM GOAL #3   Title Pt will perform bathing with min A and implementing adapted strategies PRN in order to increase independence with ADLs    Baseline total A for bathing at evaluation    Time 12    Period Weeks    Status New      OT LONG TERM GOAL #4   Title Patient will demonstrate safe positioning and handling for LUE during transfers Independently (with no verbal cues) 4/5 trials in order to decrease pain and risk of injury.    Baseline currently left inattention to LUE    Time 12    Period Weeks    Status New      OT LONG TERM GOAL #5   Title Pt will complete table top scanning with 75% accuracy in order to increase awareness to left hemisphere.    Baseline 24% accuracy -29/121 double digit cancellation    Time 12    Period Weeks    Status Achieved      OT LONG TERM GOAL #6   Title Patient will prepare a light snack with min A and adapted strategies PRN in order  to increase independence with IADLs.    Baseline not preparing snacks    Time 12    Period Weeks    Status New      OT LONG TERM GOAL #7   Title Pt will utilize LUE as a stabilizer with no more than 2 verbal cues in order to increase independence and use of LUE.    Time  12    Period Weeks    Status New                 Plan - 08/16/20 1104    Clinical Impression Statement Pt is going on hold after this visit for OT and PT for waiting for AFO and getting a chance for carryover at home with therapy and increasing independence.    OT Occupational Profile and History Detailed Assessment- Review of Records and additional review of physical, cognitive, psychosocial history related to current functional performance    Occupational performance deficits (Please refer to evaluation for details): IADL's;ADL's;Rest and Sleep;Education    Body Structure / Function / Physical Skills ADL;Strength;Gait;Decreased knowledge of use of DME;Balance;Pain;UE functional use;GMC;Dexterity;Endurance;IADL;ROM;Vision;Coordination;Flexibility;Mobility;Sensation;FMC;Decreased knowledge of precautions    Cognitive Skills Attention;Safety Awareness;Problem Solve;Understand;Sequencing    Rehab Potential Good    Clinical Decision Making Several treatment options, min-mod task modification necessary    Comorbidities Affecting Occupational Performance: May have comorbidities impacting occupational performance    Modification or Assistance to Complete Evaluation  Min-Moderate modification of tasks or assist with assess necessary to complete eval    OT Frequency 2x / week    OT Duration 12 weeks    OT Treatment/Interventions Self-care/ADL training;Moist Heat;DME and/or AE instruction;Splinting;Balance training;Fluidtherapy;Gait Training;Therapeutic activities;Therapeutic exercise;Ultrasound;Electrical Stimulation;Energy conservation;Manual Therapy;Patient/family education;Visual/perceptual remediation/compensation;Passive range of motion;Functional Mobility Training;Neuromuscular education    Plan on hold after 11/18 session until 12/6. will need renewal by 12/13    Consulted and Agree with Plan of Care Patient;Family member/caregiver    Family Member Consulted Aunt           Patient  will benefit from skilled therapeutic intervention in order to improve the following deficits and impairments:   Body Structure / Function / Physical Skills: ADL, Strength, Gait, Decreased knowledge of use of DME, Balance, Pain, UE functional use, GMC, Dexterity, Endurance, IADL, ROM, Vision, Coordination, Flexibility, Mobility, Sensation, FMC, Decreased knowledge of precautions Cognitive Skills: Attention, Safety Awareness, Problem Solve, Understand, Sequencing     Visit Diagnosis: Muscle weakness (generalized)  Other abnormalities of gait and mobility  Hemiplegia and hemiparesis following other cerebrovascular disease affecting left non-dominant side (HCC)  Acute pain of left shoulder  Stiffness of left shoulder, not elsewhere classified  Other lack of coordination  Attention and concentration deficit    Problem List Patient Active Problem List   Diagnosis Date Noted  . Uncontrolled type 2 diabetes mellitus with hyperglycemia, without long-term current use of insulin (HCC) 04/16/2020  . CVA (cerebral vascular accident) (HCC) 04/15/2020  . Cocaine use 04/15/2020  . Acute CVA (cerebrovascular accident) (HCC) 03/04/2020  . Stroke (cerebrum) (HCC) 03/03/2020  . Weakness   . Noncompliance with medication regimen   . TIA (transient ischemic attack) 01/22/2019  . Diabetes mellitus due to underlying condition without complications (HCC) 05/30/2014  . Essential hypertension, benign 05/30/2014  . Smoking 05/30/2014    Junious DresserKirstyn M Nancie Bocanegra MOT, OTR/L  08/16/2020, 5:09 PM  [facility] [address] [place], [place], [address] Phone: [phone]   Fax:  [phone]  Name: Avis EpleyRuby Matthews MRN: [id number] Date  of Birth: [birth year]

## 2020-08-17 NOTE — Therapy (Signed)
[facility] [address] [place], [place], [address] Phone: [phone]   Fax:  [phone]  Physical Therapy Treatment  Patient Details  Name: Ebony Matthews MRN: [id number] Date of Birth: 13-Jun-1981 Referring Provider (PT): Referred by: Georgiann Cocker, MD. Followed by Shon Millet, MD   Encounter Date: 08/16/2020   PT End of Session - 08/16/20 1023    Visit Number 28    Number of Visits 28    Date for PT Re-Evaluation 10/04/20    Authorization Type UHC    PT Start Time 1020    PT Stop Time 1100    PT Time Calculation (min) 40 min    Equipment Utilized During Treatment Gait belt;Other (comment)   posterior Thusane with heel wedge   Activity Tolerance Patient tolerated treatment well    Behavior During Therapy WFL for tasks assessed/performed;Flat affect           Past Medical History:  Diagnosis Date  . Diabetes mellitus without complication (HCC)   . Hypertension   . TIA (transient ischemic attack) 01/22/2019    Past Surgical History:  Procedure Laterality Date  . NO PAST SURGERIES      There were no vitals filed for this visit.   Subjective Assessment - 08/16/20 1022    Subjective No new complaints. No falls or pain reported.    Patient is accompained by: Family member   aunt [name]   Pertinent History hx of multiple CVA, DM, HTN, drug abuse    Limitations Standing;Walking;House hold activities;Lifting    How long can you sit comfortably? no issues    How long can you stand comfortably? <30 sec    How long can you walk comfortably? <10 feet with quad cane    Patient Stated Goals Get my balance better.    Currently in Pain? No/denies    Pain Score 0-No pain               [facility] Adult PT Treatment/Exercise - 08/16/20 1711      Transfers   Transfers Sit to Stand;Stand to Sit    Sit to Stand 4: Min guard    Sit to Stand Details Verbal cues for sequencing;Verbal cues for technique;Verbal cues  for safe use of DME/AE;Manual facilitation for weight shifting;Manual facilitation for placement    Stand to Sit 4: Min guard    Stand to Sit Details (indicate cue type and reason) Verbal cues for sequencing;Verbal cues for technique;Verbal cues for safe use of DME/AE    Stand Pivot Transfers 4: Min guard    Stand Pivot Transfer Details (indicate cue type and reason) wheelchair to mat table      Ambulation/Gait   Ambulation/Gait Yes    Ambulation/Gait Assistance 4: Min guard    Ambulation/Gait Assistance Details continue with use of posterior Thusane brace with heel wedge. cues needed on posture, step lenght and weight shifting with gait.     Ambulation Distance (Feet) 115 Feet   x1   Assistive device Hemi-walker    Gait Pattern Step-to pattern;Decreased arm swing - left;Decreased arm swing - right;Decreased step length - left;Decreased hip/knee flexion - left;Decreased dorsiflexion - left;Decreased weight shift to left;Decreased stance time - left;Wide base of support    Ambulation Surface Level;Indoor      Exercises   Exercises Other Exercises    Other Exercises  supine on mat with wedge under legs- quad sets with pt pushing into bolster for 5 sec holds x 10 reps, then  short arc quads for 10 reps with assist needed, trace to minimal quad activaiton noted on left LE; seated edge of mat attempted long arc quads with assist, pt compensates with hip flexion despite cues; in standing with single right UE support on sturdy surface- left stance with right foot taps to 4 inch box x 10 reps with PTA assisting for left knee stability. then with right foot forward on 4 inch box- left knee flexion/extension for 10 reps with assistance needed.                     PT Short Term Goals - 07/26/20 1024      PT SHORT TERM GOAL #1   Title Patient and caregiver will report walking daily in home for improved household mobility (ALL STGs Due: 07/27/2020)    Baseline continues to report not walking  daily, currently walking approx 2-3x/week    Time 3    Period Weeks    Status On-going    Target Date 07/27/20      PT SHORT TERM GOAL #2   Title Patient will improve TUG to <1 min to demonstrate improved functional mobility    Baseline 1 min, 32 secs; 10/28 1 min, 17 secs    Time 3    Period Weeks    Status On-going      PT SHORT TERM GOAL #3   Title Patient will improve gait speed to > 1.0 ft sec to demonstrate improved household mobility    Baseline deferred as unable to walk without assistance    Time 3    Period Weeks    Status Deferred             PT Long Term Goals - 07/06/20 1335      PT LONG TERM GOAL #1   Title Patient will report independence with progressive HEP with caregiver asssistance (ALL LTG's Due: 08/17/20)    Baseline continue to progress HEP    Time 6    Period Weeks    Status New    Target Date 08/17/20      PT LONG TERM GOAL #2   Title Patient will demo ability to ambulate >250 ft with LRAD and CGA to demo improved functional mobility and ambulation within the home    Baseline 14 ft, Min A; 207' with CGA and hemi walker    Time 6    Period Weeks    Status New      PT LONG TERM GOAL #3   Title Patient will demo ability to complete TUG  < 50 seconds to demo improved ambulation and mobility    Baseline 1 min 32 secs    Time 6    Period Weeks    Status Revised      PT LONG TERM GOAL #4   Title Patient will improve gait speed to >/= 1.5 ft/sec to demonstrate improved mobility    Baseline not yet assessed    Time 6    Period Weeks    Status New                 Plan - 08/16/20 1023    Clinical Impression Statement Today's skilled session continued to focus on left LE strengthening and gait with brace/hemiwalker. Rest breaks taken as needed. No other issues noted or reported with session. The pt is progressing toward goals and should benefit from continued PT to progress toward unmet goals.    Personal Factors and Comorbidities  Comorbidity 3+;Past/Current Experience;Time since onset of injury/illness/exacerbation;Transportation;Behavior Pattern    Comorbidities DM, HTN, hx of multiple strokes in last 6 months    Examination-Activity Limitations Bathing;Bed Mobility;Caring for Others;Carry;Dressing;Hygiene/Grooming;Lift;Reach Overhead;Squat    Examination-Participation Restrictions Cleaning;Community Activity;Driving;Laundry;Yard Work;Occupation;Church;Meal Prep    Stability/Clinical Decision Making Evolving/Moderate complexity    Rehab Potential Good    PT Frequency 2x / week    PT Duration 6 weeks    PT Treatment/Interventions ADLs/Self Care Home Management;Gait training;Stair training;Functional mobility training;Therapeutic activities;Therapeutic exercise;Balance training;Neuromuscular re-education;Manual techniques;Orthotic Fit/Training;Patient/family education;Cognitive remediation;Passive range of motion;Energy conservation;DME Instruction;Electrical Stimulation;Joint Manipulations    PT Next Visit Plan patient will be placed on hold after 11/18 visit (2 weeks to obtain AFO). continue activites to promote quad actvitiation. transfer training. continue with use of posterior Thusane with heel wedge (wedge is on desk between [place] and [employer])    PT Home Exercise Plan seated hip march, bridge, transfer training (bed, commode), PROM L ankle    Consulted and Agree with Plan of Care Patient;Family member/caregiver    Family Member Consulted Aunt           Patient will benefit from skilled therapeutic intervention in order to improve the following deficits and impairments:  Abnormal gait, Decreased activity tolerance, Decreased balance, Decreased cognition, Decreased coordination, Decreased endurance, Decreased knowledge of precautions, Decreased mobility, Decreased range of motion, Decreased safety awareness, Decreased strength, Difficulty walking, Impaired perceived functional ability, Impaired flexibility,  Impaired sensation, Impaired tone, Impaired UE functional use, Postural dysfunction, Pain  Visit Diagnosis: Muscle weakness (generalized)  Other abnormalities of gait and mobility  Hemiplegia and hemiparesis following other cerebrovascular disease affecting left non-dominant side [facility])     Problem List Patient Active Problem List   Diagnosis Date Noted  . Uncontrolled type 2 diabetes mellitus with hyperglycemia, without long-term current use of insulin (HCC) 04/16/2020  . CVA (cerebral vascular accident) (HCC) 04/15/2020  . Cocaine use 04/15/2020  . Acute CVA (cerebrovascular accident) (HCC) 03/04/2020  . Stroke (cerebrum) (HCC) 03/03/2020  . Weakness   . Noncompliance with medication regimen   . TIA (transient ischemic attack) 01/22/2019  . Diabetes mellitus due to underlying condition without complications (HCC) 05/30/2014  . Essential hypertension, benign 05/30/2014  . Smoking 05/30/2014    Sallyanne Kuster, PTA, [facility] [address] [place], [place] [address] [phone] 08/17/20, 5:18 PM   Name: Chloris Marcoux MRN: [id number] Date of Birth: 14-Dec-1980

## 2020-08-21 ENCOUNTER — Ambulatory Visit: Payer: Medicaid Other | Admitting: Physical Therapy

## 2020-08-21 ENCOUNTER — Encounter: Payer: Commercial Managed Care - PPO | Admitting: Occupational Therapy

## 2020-08-28 ENCOUNTER — Encounter: Payer: Commercial Managed Care - PPO | Admitting: Occupational Therapy

## 2020-08-28 ENCOUNTER — Ambulatory Visit: Payer: Self-pay | Admitting: Physical Therapy

## 2020-08-30 ENCOUNTER — Encounter: Payer: Commercial Managed Care - PPO | Admitting: Occupational Therapy

## 2020-09-04 ENCOUNTER — Ambulatory Visit: Payer: Medicaid Other | Admitting: Physical Therapy

## 2020-09-04 ENCOUNTER — Ambulatory Visit: Payer: Medicaid Other | Admitting: Occupational Therapy

## 2020-09-07 ENCOUNTER — Encounter: Payer: Self-pay | Admitting: Occupational Therapy

## 2020-09-07 ENCOUNTER — Ambulatory Visit: Payer: Medicaid Other | Attending: Internal Medicine | Admitting: Physical Therapy

## 2020-09-07 ENCOUNTER — Ambulatory Visit: Payer: Medicaid Other | Admitting: Occupational Therapy

## 2020-09-07 ENCOUNTER — Other Ambulatory Visit: Payer: Self-pay

## 2020-09-07 DIAGNOSIS — M25512 Pain in left shoulder: Secondary | ICD-10-CM | POA: Insufficient documentation

## 2020-09-07 DIAGNOSIS — R41840 Attention and concentration deficit: Secondary | ICD-10-CM

## 2020-09-07 DIAGNOSIS — R2689 Other abnormalities of gait and mobility: Secondary | ICD-10-CM | POA: Diagnosis present

## 2020-09-07 DIAGNOSIS — R2681 Unsteadiness on feet: Secondary | ICD-10-CM | POA: Diagnosis present

## 2020-09-07 DIAGNOSIS — R278 Other lack of coordination: Secondary | ICD-10-CM

## 2020-09-07 DIAGNOSIS — M6281 Muscle weakness (generalized): Secondary | ICD-10-CM | POA: Insufficient documentation

## 2020-09-07 DIAGNOSIS — M25612 Stiffness of left shoulder, not elsewhere classified: Secondary | ICD-10-CM | POA: Diagnosis present

## 2020-09-07 DIAGNOSIS — I69854 Hemiplegia and hemiparesis following other cerebrovascular disease affecting left non-dominant side: Secondary | ICD-10-CM | POA: Diagnosis present

## 2020-09-07 DIAGNOSIS — I69354 Hemiplegia and hemiparesis following cerebral infarction affecting left non-dominant side: Secondary | ICD-10-CM | POA: Insufficient documentation

## 2020-09-07 DIAGNOSIS — R296 Repeated falls: Secondary | ICD-10-CM | POA: Diagnosis present

## 2020-09-07 NOTE — Therapy (Signed)
[facility] [address] [place], [place], [address] Phone: [phone]   Fax:  [phone]  Occupational Therapy Treatment  Patient Details  Name: Ebony Matthews MRN: [id number] Date of Birth: 1981/04/08 Referring Provider (OT): Dr. Everlena Cooper   Encounter Date: 09/07/2020   OT End of Session - 09/07/20 1105    Visit Number 14    Number of Visits 25    Date for OT Re-Evaluation 09/10/20    Authorization Type UHC    Authorization Time Period VL:MN - auth required after 25th visit    Authorization - Visit Number 14    Authorization - Number of Visits 25    OT Start Time 1105    OT Stop Time 1145    OT Time Calculation (min) 40 min    Equipment Utilized During Treatment gait belt    Activity Tolerance Patient tolerated treatment well    Behavior During Therapy [facility] for tasks assessed/performed;Flat affect           Past Medical History:  Diagnosis Date  . Diabetes mellitus without complication (HCC)   . Hypertension   . TIA (transient ischemic attack) 01/22/2019    Past Surgical History:  Procedure Laterality Date  . NO PAST SURGERIES      There were no vitals filed for this visit.   Subjective Assessment - 09/07/20 1105    Subjective  Pt denies any pain or changes.    Patient is accompanied by: Family member   aunt   Pertinent History PMH of CVA 2020, CVA June 2021 (R cerebellar) HTN, IIDM, HLD, on going cocaine/polysubstance abuse, non-compliant with medications and presented with new onset of left arm weakness and numbness.    Limitations Fall Risk. Not Driving. Left Inattention    Patient Stated Goals "to be able to move my arms" and "to be able to hold on and grip on stuff" with LUE    Currently in Pain? No/denies                        OT Treatments/Exercises (OP) - 09/07/20 1116      Transfers   Comments ambulation with hemi walker and AFO on LLE with CGA      ADLs   LB Dressing  simulated clothing management for toileting with pulling pants up and down with theraband with min A for stability.    Toileting Pt simulated reaching down to knees and around bilateral lower extremities with obtaining 6 yellow resistance clothepins with CGA for stability. Pt did this activity x 2      Neurological Re-education Exercises   Other Exercises 1 working on assessing and facilitation of active movement in LUE with UE Ranger with little activation distal to shoulder. Slight activation at shoulder flexion noted.    Other Exercises 2 weight bearing in LUE with diagonal reach x 10 for facilitation of activation    Towel Slides on table seated with PROM x 10 reach, horizontal abduction and IR/ER                    OT Short Term Goals - 09/07/20 1115      OT SHORT TERM GOAL #1   Title Pt will be independent with HEP 07/16/2020    Baseline HEP not issued yet    Time 4    Period Weeks    Status On-going    Target Date 07/16/20      OT SHORT  TERM GOAL #2   Title Pt will perform UB dressing with min A and implementing hemitechniques and adapted strategies PRN in order to increase independence with ADLs    Baseline max A for UB dressing    Time 4    Period Weeks    Status On-going   mod  A 11/9     OT SHORT TERM GOAL #3   Title Pt will perform bathing with mod A and implementing adapted strategies PRN in order to increase independence with ADLs    Baseline total A currently for all bathing    Time 4    Period Weeks    Status Achieved   pt reports she is doing most of the wiping of her body     OT SHORT TERM GOAL #4   Title Patient will demonstrate safe positioning and handling for LUE during transfers with min verbal cues 4/5 trials in order to decrease pain and risk of injury.    Baseline currently severe left inattention to LUE    Time 4    Period Weeks    Status On-going      OT SHORT TERM GOAL #5   Title Pt will complete table top scanning with 50 % accuracy in  order to increase awareness to left hemisphere.    Baseline 29/121 or approx 24%    Time 4    Period Weeks    Status Achieved      OT SHORT TERM GOAL #6   Title Patient will prepare a light snack with mod A and adapted strategies PRN in order to increase independence with IADLs.    Baseline total A for snack prep    Time 4    Period Weeks    Status Deferred             OT Long Term Goals - 08/02/20 1214      OT LONG TERM GOAL #1   Title Pt will be independent with updated HEP 08/13/20    Baseline not implemented .    Time 12    Period Weeks    Status New      OT LONG TERM GOAL #2   Title Pt will perform UB dressing with supervision and implementing hemitechniques and adapted strategies PRN in order to increase independence with ADLs    Baseline max A for UB dressing    Time 12    Period Weeks    Status New      OT LONG TERM GOAL #3   Title Pt will perform bathing with min A and implementing adapted strategies PRN in order to increase independence with ADLs    Baseline total A for bathing at evaluation    Time 12    Period Weeks    Status New      OT LONG TERM GOAL #4   Title Patient will demonstrate safe positioning and handling for LUE during transfers Independently (with no verbal cues) 4/5 trials in order to decrease pain and risk of injury.    Baseline currently left inattention to LUE    Time 12    Period Weeks    Status New      OT LONG TERM GOAL #5   Title Pt will complete table top scanning with 75% accuracy in order to increase awareness to left hemisphere.    Baseline 24% accuracy -29/121 double digit cancellation    Time 12    Period Weeks    Status Achieved  OT LONG TERM GOAL #6   Title Patient will prepare a light snack with min A and adapted strategies PRN in order to increase independence with IADLs.    Baseline not preparing snacks    Time 12    Period Weeks    Status New      OT LONG TERM GOAL #7   Title Pt will utilize LUE as a  stabilizer with no more than 2 verbal cues in order to increase independence and use of LUE.    Time 12    Period Weeks    Status New                 Plan - 09/07/20 1218    Clinical Impression Statement Pt reports doing more with ADLs at home and is made improvements with ambulation and mobility increasing independnece with ADLs and IADLs.    OT Occupational Profile and History Detailed Assessment- Review of Records and additional review of physical, cognitive, psychosocial history related to current functional performance    Occupational performance deficits (Please refer to evaluation for details): IADL's;ADL's;Rest and Sleep;Education    Body Structure / Function / Physical Skills ADL;Strength;Gait;Decreased knowledge of use of DME;Balance;Pain;UE functional use;GMC;Dexterity;Endurance;IADL;ROM;Vision;Coordination;Flexibility;Mobility;Sensation;FMC;Decreased knowledge of precautions    Cognitive Skills Attention;Safety Awareness;Problem Solve;Understand;Sequencing    Rehab Potential Good    Clinical Decision Making Several treatment options, min-mod task modification necessary    Comorbidities Affecting Occupational Performance: May have comorbidities impacting occupational performance    Modification or Assistance to Complete Evaluation  Min-Moderate modification of tasks or assist with assess necessary to complete eval    OT Frequency 2x / week    OT Duration 12 weeks    OT Treatment/Interventions Self-care/ADL training;Moist Heat;DME and/or AE instruction;Splinting;Balance training;Fluidtherapy;Gait Training;Therapeutic activities;Therapeutic exercise;Ultrasound;Electrical Stimulation;Energy conservation;Manual Therapy;Patient/family education;Visual/perceptual remediation/compensation;Passive range of motion;Functional Mobility Training;Neuromuscular education    Plan NMR LUE, kitchen mobility/make snack    Consulted and Agree with Plan of Care Patient;Family member/caregiver     Family Member Consulted Aunt           Patient will benefit from skilled therapeutic intervention in order to improve the following deficits and impairments:   Body Structure / Function / Physical Skills: ADL,Strength,Gait,Decreased knowledge of use of DME,Balance,Pain,UE functional use,GMC,Dexterity,Endurance,IADL,ROM,Vision,Coordination,Flexibility,Mobility,Sensation,FMC,Decreased knowledge of precautions Cognitive Skills: Attention,Safety Awareness,Problem Solve,Understand,Sequencing     Visit Diagnosis: Muscle weakness (generalized)  Other abnormalities of gait and mobility  Hemiplegia and hemiparesis following other cerebrovascular disease affecting left non-dominant side (HCC)  Acute pain of left shoulder  Stiffness of left shoulder, not elsewhere classified  Other lack of coordination  Attention and concentration deficit    Problem List Patient Active Problem List   Diagnosis Date Noted  . Uncontrolled type 2 diabetes mellitus with hyperglycemia, without long-term current use of insulin (HCC) 04/16/2020  . CVA (cerebral vascular accident) (HCC) 04/15/2020  . Cocaine use 04/15/2020  . Acute CVA (cerebrovascular accident) (HCC) 03/04/2020  . Stroke (cerebrum) (HCC) 03/03/2020  . Weakness   . Noncompliance with medication regimen   . TIA (transient ischemic attack) 01/22/2019  . Diabetes mellitus due to underlying condition without complications (HCC) 05/30/2014  . Essential hypertension, benign 05/30/2014  . Smoking 05/30/2014    Junious Dresser MOT, OTR/L  09/07/2020, 12:22 PM  [facility] [address] [place], [place], [address] Phone: [phone]   Fax:  [phone]  Name: Tniyah Nakagawa MRN: [id number] Date of Birth: 1980-11-14

## 2020-09-07 NOTE — Therapy (Signed)
[facility] [address] [place], [place], [address] Phone: [phone]   Fax:  [phone]  Physical Therapy Treatment  Patient Details  Name: Ebony Matthews MRN: [id number] Date of Birth: 1981/03/14 Referring Provider (PT): Referred by: Georgiann Cocker, MD. Followed by Shon Millet, MD   Encounter Date: 09/07/2020   PT End of Session - 09/07/20 1106    Visit Number 29    Number of Visits 28    Date for PT Re-Evaluation 10/04/20    Authorization Type U[employer]    PT Start Time 1015    PT Stop Time 1100    PT Time Calculation (min) 45 min    Equipment Utilized During Treatment Gait belt;Other (comment)   posterior Thusane with heel wedge   Activity Tolerance Patient tolerated treatment well    Behavior During Therapy WFL for tasks assessed/performed;Flat affect           Past Medical History:  Diagnosis Date  . Diabetes mellitus without complication (HCC)   . Hypertension   . TIA (transient ischemic attack) 01/22/2019    Past Surgical History:  Procedure Laterality Date  . NO PAST SURGERIES      There were no vitals filed for this visit.   Subjective Assessment - 09/07/20 1022    Subjective Pt reports no new issues or falls. Pt has been walking outside and indoors with the hemi walker.    Patient is accompained by: Family member   aunt [name]   Pertinent History hx of multiple CVA, DM, HTN, drug abuse    Limitations Standing;Walking;House hold activities;Lifting    How long can you sit comfortably? no issues    How long can you stand comfortably? <30 sec    How long can you walk comfortably? <10 feet with quad cane    Patient Stated Goals Get my balance better.    Currently in Pain? No/denies            Sit<>stand x10; manual and tactile facilitation/assist for increased L weight shift  Standing weight shift L<>R x20; manual and tactile facilitation/assist  Weight shift L with quad set/terminal knee  extension 3x10  Forward/backward stepping & weight shifting 3x10 no a/d with mirror for visual cueing ; manual and tactile facilitation for increased L weight shift & facilitation for quad activation  Holding symmetrical standing x30 sec each  Ambulating x60' with RW (L hand on walker attachment) to encourage increased L weight shift; min A for manual and tactile facilitation for weightshift and quad activation  Turning x 4 with RW min A for manual and tactile facilitation for increased L weight shift and quad activation  Seated AAROM for long arc quad x20                         PT Short Term Goals - 07/26/20 1024      PT SHORT TERM GOAL #1   Title Patient and caregiver will report walking daily in home for improved household mobility (ALL STGs Due: 07/27/2020)    Baseline continues to report not walking daily, currently walking approx 2-3x/week    Time 3    Period Weeks    Status On-going    Target Date 07/27/20      PT SHORT TERM GOAL #2   Title Patient will improve TUG to <1 min to demonstrate improved functional mobility    Baseline 1 min, 32 secs; 10/28 1 min, 17 secs  Time 3    Period Weeks    Status On-going      PT SHORT TERM GOAL #3   Title Patient will improve gait speed to > 1.0 ft sec to demonstrate improved household mobility    Baseline deferred as unable to walk without assistance    Time 3    Period Weeks    Status Deferred             PT Long Term Goals - 07/06/20 1335      PT LONG TERM GOAL #1   Title Patient will report independence with progressive HEP with caregiver asssistance (ALL LTG's Due: 08/17/20)    Baseline continue to progress HEP    Time 6    Period Weeks    Status New    Target Date 08/17/20      PT LONG TERM GOAL #2   Title Patient will demo ability to ambulate >250 ft with LRAD and CGA to demo improved functional mobility and ambulation within the home    Baseline 14 ft, Min A; 207' with CGA and hemi walker     Time 6    Period Weeks    Status New      PT LONG TERM GOAL #3   Title Patient will demo ability to complete TUG  < 50 seconds to demo improved ambulation and mobility    Baseline 1 min 32 secs    Time 6    Period Weeks    Status Revised      PT LONG TERM GOAL #4   Title Patient will improve gait speed to >/= 1.5 ft/sec to demonstrate improved mobility    Baseline not yet assessed    Time 6    Period Weeks    Status New                 Plan - 09/07/20 1150    Clinical Impression Statement Pt returns after 2 week hold with her own personal AFO. Pt walks into clinic with her hemiwalker. Treatment focused on improving symmetrical standing, L LE strengthening, and gait training with pt's personal AFO. Rest breaks as needed. Pt requiring increased cueing for L weightshift and activation of L quad with weightbearing (this remains poor).    Personal Factors and Comorbidities Comorbidity 3+;Past/Current Experience;Time since onset of injury/illness/exacerbation;Transportation;Behavior Pattern    Comorbidities DM, HTN, hx of multiple strokes in last 6 months    Examination-Activity Limitations Bathing;Bed Mobility;Caring for Others;Carry;Dressing;Hygiene/Grooming;Lift;Reach Overhead;Squat    Examination-Participation Restrictions Cleaning;Community Activity;Driving;Laundry;Yard Work;Occupation;Church;Meal Prep    Stability/Clinical Decision Making Evolving/Moderate complexity    Rehab Potential Good    PT Frequency 2x / week    PT Duration 6 weeks    PT Treatment/Interventions ADLs/Self Care Home Management;Gait training;Stair training;Functional mobility training;Therapeutic activities;Therapeutic exercise;Balance training;Neuromuscular re-education;Manual techniques;Orthotic Fit/Training;Patient/family education;Cognitive remediation;Passive range of motion;Energy conservation;DME Instruction;Electrical Stimulation;Joint Manipulations    PT Next Visit Plan Continue activites to  promote quad actvitiation. transfer training and gait training with pt's personal AFO.    PT Home Exercise Plan seated hip march, bridge, transfer training (bed, commode), PROM L ankle    Consulted and Agree with Plan of Care Patient;Family member/caregiver    Family Member Consulted Aunt           Patient will benefit from skilled therapeutic intervention in order to improve the following deficits and impairments:  Abnormal gait,Decreased activity tolerance,Decreased balance,Decreased cognition,Decreased coordination,Decreased endurance,Decreased knowledge of precautions,Decreased mobility,Decreased range of motion,Decreased safety awareness,Decreased strength,Difficulty walking,Impaired perceived  functional ability,Impaired flexibility,Impaired sensation,Impaired tone,Impaired UE functional use,Postural dysfunction,Pain  Visit Diagnosis: Muscle weakness (generalized)  Other abnormalities of gait and mobility  Hemiplegia and hemiparesis following other cerebrovascular disease affecting left non-dominant side (HCC)  Other lack of coordination  Unsteadiness on feet     Problem List Patient Active Problem List   Diagnosis Date Noted  . Uncontrolled type 2 diabetes mellitus with hyperglycemia, without long-term current use of insulin (HCC) 04/16/2020  . CVA (cerebral vascular accident) (HCC) 04/15/2020  . Cocaine use 04/15/2020  . Acute CVA (cerebrovascular accident) (HCC) 03/04/2020  . Stroke (cerebrum) (HCC) 03/03/2020  . Weakness   . Noncompliance with medication regimen   . TIA (transient ischemic attack) 01/22/2019  . Diabetes mellitus due to underlying condition without complications (HCC) 05/30/2014  . Essential hypertension, benign 05/30/2014  . Smoking 05/30/2014    Denee Boeder April Ma L Kimoni Pagliarulo PT, DPT 09/07/2020, 12:07 PM  [facility] [address] [place], [place], [address] Phone: [phone]   Fax:   [phone]  Name: Suni Jarnagin MRN: [id number] Date of Birth: 1981-06-17

## 2020-09-11 ENCOUNTER — Encounter: Payer: Self-pay | Admitting: Occupational Therapy

## 2020-09-11 ENCOUNTER — Ambulatory Visit: Payer: Medicaid Other | Admitting: Occupational Therapy

## 2020-09-11 ENCOUNTER — Ambulatory Visit: Payer: Medicaid Other | Admitting: Physical Therapy

## 2020-09-11 ENCOUNTER — Other Ambulatory Visit: Payer: Self-pay

## 2020-09-11 DIAGNOSIS — R278 Other lack of coordination: Secondary | ICD-10-CM

## 2020-09-11 DIAGNOSIS — R2681 Unsteadiness on feet: Secondary | ICD-10-CM

## 2020-09-11 DIAGNOSIS — R41840 Attention and concentration deficit: Secondary | ICD-10-CM

## 2020-09-11 DIAGNOSIS — M6281 Muscle weakness (generalized): Secondary | ICD-10-CM

## 2020-09-11 DIAGNOSIS — M25612 Stiffness of left shoulder, not elsewhere classified: Secondary | ICD-10-CM

## 2020-09-11 DIAGNOSIS — R2689 Other abnormalities of gait and mobility: Secondary | ICD-10-CM

## 2020-09-11 DIAGNOSIS — I69854 Hemiplegia and hemiparesis following other cerebrovascular disease affecting left non-dominant side: Secondary | ICD-10-CM

## 2020-09-11 DIAGNOSIS — M25512 Pain in left shoulder: Secondary | ICD-10-CM

## 2020-09-11 NOTE — Patient Instructions (Signed)
Supination (Passive)    Keep elbow bent at right angle and held firmly at side. Use other hand to turn forearm until palm faces upward. Hold _20_ seconds. Repeat _10_ times. Do 2-3_ sessions per day.  Extension (Passive)    Keep palm on table, using other hand on top to assist. Raise elbow. Hold 20__ seconds. Repeat _10_ times. Do 2-3_ sessions per day.  Copyright  VHI. All rights reserved.

## 2020-09-11 NOTE — Therapy (Signed)
[facility] [address] [place], [place], [address] Phone: [phone]   Fax:  [phone]  Physical Therapy Treatment  Patient Details  Name: Ebony Matthews MRN: [id number] Date of Birth: Jan 17, 1981 Referring Provider (PT): Referred by: Georgiann Cocker, MD. Followed by Shon Millet, MD   Encounter Date: 09/11/2020   PT End of Session - 09/11/20 1320    Visit Number 30    Date for PT Re-Evaluation 10/04/20    Authorization Type UHC    PT Start Time 1235    PT Stop Time 1315    PT Time Calculation (min) 40 min    Equipment Utilized During Treatment Gait belt;Other (comment)   posterior Thusane with heel wedge   Activity Tolerance Patient tolerated treatment well    Behavior During Therapy WFL for tasks assessed/performed;Flat affect           Past Medical History:  Diagnosis Date  . Diabetes mellitus without complication (HCC)   . Hypertension   . TIA (transient ischemic attack) 01/22/2019    Past Surgical History:  Procedure Laterality Date  . NO PAST SURGERIES      There were no vitals filed for this visit.   Subjective Assessment - 09/11/20 1416    Subjective Pt with no no issues to report.    Patient is accompained by: Family member   aunt [name]   Pertinent History hx of multiple CVA, DM, HTN, drug abuse    Limitations Standing;Walking;House hold activities;Lifting    How long can you sit comfortably? no issues    How long can you stand comfortably? <30 sec    How long can you walk comfortably? <10 feet with quad cane    Patient Stated Goals Get my balance better.    Currently in Pain? No/denies              Standing:  Weightshift L<>R 3x10; hold weightshift on L with quad contraction x3 sec. Min assist required for added knee stability and neurofacilitation  Weightshift L<>R with alternating knee flexion 2x10; hold weightshift on L x 5 sec. Min assist for weightshift, added knee stability  and neurofacilitation  SLS on L with R foot tapping forward on target 2x10. Min assist for weightshift, added knee stability and neurofacilitation  SLS on L with R foot stepping forward/backward x10. Min assist required for added knee stability and neurofacilitation  Transfers:  Sit<>stand x5 CGA. Cues for L foot placement and increased L LE weight shift   Ambulating 6' x 4 min A for L LE stability no a/d. Min assist for weightshift, added knee stability and neurofacilitation of L quad                   PT Short Term Goals - 07/26/20 1024      PT SHORT TERM GOAL #1   Title Patient and caregiver will report walking daily in home for improved household mobility (ALL STGs Due: 07/27/2020)    Baseline continues to report not walking daily, currently walking approx 2-3x/week    Time 3    Period Weeks    Status On-going    Target Date 07/27/20      PT SHORT TERM GOAL #2   Title Patient will improve TUG to <1 min to demonstrate improved functional mobility    Baseline 1 min, 32 secs; 10/28 1 min, 17 secs    Time 3    Period Weeks    Status On-going  PT SHORT TERM GOAL #3   Title Patient will improve gait speed to > 1.0 ft sec to demonstrate improved household mobility    Baseline deferred as unable to walk without assistance    Time 3    Period Weeks    Status Deferred             PT Long Term Goals - 07/06/20 1335      PT LONG TERM GOAL #1   Title Patient will report independence with progressive HEP with caregiver asssistance (ALL LTG's Due: 08/17/20)    Baseline continue to progress HEP    Time 6    Period Weeks    Status New    Target Date 08/17/20      PT LONG TERM GOAL #2   Title Patient will demo ability to ambulate >250 ft with LRAD and CGA to demo improved functional mobility and ambulation within the home    Baseline 14 ft, Min A; 207' with CGA and hemi walker    Time 6    Period Weeks    Status New      PT LONG TERM GOAL #3   Title  Patient will demo ability to complete TUG  < 50 seconds to demo improved ambulation and mobility    Baseline 1 min 32 secs    Time 6    Period Weeks    Status Revised      PT LONG TERM GOAL #4   Title Patient will improve gait speed to >/= 1.5 ft/sec to demonstrate improved mobility    Baseline not yet assessed    Time 6    Period Weeks    Status New                 Plan - 09/11/20 1416    Clinical Impression Statement Treatment session focused on continuing to improve L LE weight shift and quad activation for more efficient gait pattern. Practiced ambulating without a/d and symmetrical standing. Pt would benefit from continued PT to meet her PT goals.    Personal Factors and Comorbidities Comorbidity 3+;Past/Current Experience;Time since onset of injury/illness/exacerbation;Transportation;Behavior Pattern    Comorbidities DM, HTN, hx of multiple strokes in last 6 months    Examination-Activity Limitations Bathing;Bed Mobility;Caring for Others;Carry;Dressing;Hygiene/Grooming;Lift;Reach Overhead;Squat    Examination-Participation Restrictions Cleaning;Community Activity;Driving;Laundry;Yard Work;Occupation;Church;Meal Prep    Stability/Clinical Decision Making Evolving/Moderate complexity    Rehab Potential Good    PT Frequency 2x / week    PT Duration 6 weeks    PT Treatment/Interventions ADLs/Self Care Home Management;Gait training;Stair training;Functional mobility training;Therapeutic activities;Therapeutic exercise;Balance training;Neuromuscular re-education;Manual techniques;Orthotic Fit/Training;Patient/family education;Cognitive remediation;Passive range of motion;Energy conservation;DME Instruction;Electrical Stimulation;Joint Manipulations    PT Next Visit Plan Continue activites to promote quad actvitiation. transfer training and gait training with pt's personal AFO.    PT Home Exercise Plan seated hip march, bridge, transfer training (bed, commode), PROM L ankle; pt to  work on standing at counter with L weightshift    Consulted and Agree with Plan of Care Patient;Family member/caregiver    Family Member Consulted Aunt           Patient will benefit from skilled therapeutic intervention in order to improve the following deficits and impairments:  Abnormal gait,Decreased activity tolerance,Decreased balance,Decreased cognition,Decreased coordination,Decreased endurance,Decreased knowledge of precautions,Decreased mobility,Decreased range of motion,Decreased safety awareness,Decreased strength,Difficulty walking,Impaired perceived functional ability,Impaired flexibility,Impaired sensation,Impaired tone,Impaired UE functional use,Postural dysfunction,Pain  Visit Diagnosis: Muscle weakness (generalized)  Other abnormalities of gait and mobility  Hemiplegia and  hemiparesis following other cerebrovascular disease affecting left non-dominant side (HCC)  Other lack of coordination  Unsteadiness on feet     Problem List Patient Active Problem List   Diagnosis Date Noted  . Uncontrolled type 2 diabetes mellitus with hyperglycemia, without long-term current use of insulin (HCC) 04/16/2020  . CVA (cerebral vascular accident) (HCC) 04/15/2020  . Cocaine use 04/15/2020  . Acute CVA (cerebrovascular accident) (HCC) 03/04/2020  . Stroke (cerebrum) (HCC) 03/03/2020  . Weakness   . Noncompliance with medication regimen   . TIA (transient ischemic attack) 01/22/2019  . Diabetes mellitus due to underlying condition without complications (HCC) 05/30/2014  . Essential hypertension, benign 05/30/2014  . Smoking 05/30/2014    Pelham Hennick April Ma L Jordanna Hendrie PT, DPT 09/11/2020, 2:30 PM   [facility] [address] [place], [place], [address] Phone: [phone]   Fax:  [phone]  Name: Ebony Matthews MRN: [id number] Date of Birth: 09/03/1981

## 2020-09-11 NOTE — Therapy (Addendum)
[facility] [address] [place], [place], [address] Phone: [phone]   Fax:  [phone]  Occupational Therapy Treatment  Patient Details  Name: Ebony Matthews MRN: [id number] Date of Birth: 16-Jan-1981 Referring Provider (OT): Dr. Everlena Cooper   Encounter Date: 09/11/2020   OT End of Session - 09/11/20 1152    Visit Number 15    Number of Visits 25    Date for OT Re-Evaluation 10/05/20   extended renewal date d/t scheduling and not at end of POC   Authorization Type [employer]    Authorization Time Period VL:MN - auth required after 25th visit (Week 9 of 12)    Authorization - Visit Number 15    Authorization - Number of Visits 25    OT Start Time 1149    OT Stop Time 1227    OT Time Calculation (min) 38 min    Equipment Utilized During Treatment gait belt    Activity Tolerance Patient tolerated treatment well    Behavior During Therapy [facility] for tasks assessed/performed;Flat affect           Past Medical History:  Diagnosis Date  . Diabetes mellitus without complication (HCC)   . Hypertension   . TIA (transient ischemic attack) 01/22/2019    Past Surgical History:  Procedure Laterality Date  . NO PAST SURGERIES      There were no vitals filed for this visit.   Subjective Assessment - 09/11/20 1153    Subjective  Pt denies any pain or changes. "I want to be able to use my hand more and grip things"    Patient is accompanied by: Family member   aunt   Pertinent History PMH of CVA 2020, CVA June 2021 (R cerebellar) HTN, IIDM, HLD, on going cocaine/polysubstance abuse, non-compliant with medications and presented with new onset of left arm weakness and numbness.    Limitations Fall Risk. Not Driving. Left Inattention    Patient Stated Goals "to be able to move my arms" and "to be able to hold on and grip on stuff" with LUE    Currently in Pain? No/denies                        OT  Treatments/Exercises (OP) - 09/11/20 1204      ADLs   UB Dressing reviewed UB dressing techniques and verbalized understanding and procedure with mod A    Toileting sit to stand x 2 with simulating pulling up and down pants for clothing mangaement for toileting. Pt able to ambulate in and out of bathroom with hemi walker and AFO at this time and working towards increasing standing balance for performing clothing management      Modalities   Modalities Primary school teacher Stimulation Location LUE wrist extension/finger extension    Electrical Stimulation Parameters NMES 10 x 10, 50 pps, 2.0 ramp, 10 minutes, level 20    Electrical Stimulation Goals Neuromuscular facilitation;Tone                  OT Education - 09/11/20 1218    Education Details PROM for hand and supination- see pt instructions    Person(s) Educated Patient;Other (comment)    Methods Explanation;Demonstration;Handout    Comprehension Verbalized understanding;Returned demonstration            OT Short Term Goals - 09/07/20 1115      OT SHORT TERM GOAL #1  Title Pt will be independent with HEP 07/16/2020    Baseline HEP not issued yet    Time 4    Period Weeks    Status On-going    Target Date 07/16/20      OT SHORT TERM GOAL #2   Title Pt will perform UB dressing with min A and implementing hemitechniques and adapted strategies PRN in order to increase independence with ADLs    Baseline max A for UB dressing    Time 4    Period Weeks    Status On-going   mod  A 11/9     OT SHORT TERM GOAL #3   Title Pt will perform bathing with mod A and implementing adapted strategies PRN in order to increase independence with ADLs    Baseline total A currently for all bathing    Time 4    Period Weeks    Status Achieved   pt reports she is doing most of the wiping of her body     OT SHORT TERM GOAL #4   Title Patient will demonstrate safe positioning and handling  for LUE during transfers with min verbal cues 4/5 trials in order to decrease pain and risk of injury.    Baseline currently severe left inattention to LUE    Time 4    Period Weeks    Status On-going      OT SHORT TERM GOAL #5   Title Pt will complete table top scanning with 50 % accuracy in order to increase awareness to left hemisphere.    Baseline 29/121 or approx 24%    Time 4    Period Weeks    Status Achieved      OT SHORT TERM GOAL #6   Title Patient will prepare a light snack with mod A and adapted strategies PRN in order to increase independence with IADLs.    Baseline total A for snack prep    Time 4    Period Weeks    Status Deferred             OT Long Term Goals - 08/02/20 1214      OT LONG TERM GOAL #1   Title Pt will be independent with updated HEP 08/13/20    Baseline not implemented .    Time 12    Period Weeks    Status New      OT LONG TERM GOAL #2   Title Pt will perform UB dressing with supervision and implementing hemitechniques and adapted strategies PRN in order to increase independence with ADLs    Baseline max A for UB dressing    Time 12    Period Weeks    Status New      OT LONG TERM GOAL #3   Title Pt will perform bathing with min A and implementing adapted strategies PRN in order to increase independence with ADLs    Baseline total A for bathing at evaluation    Time 12    Period Weeks    Status New      OT LONG TERM GOAL #4   Title Patient will demonstrate safe positioning and handling for LUE during transfers Independently (with no verbal cues) 4/5 trials in order to decrease pain and risk of injury.    Baseline currently left inattention to LUE    Time 12    Period Weeks    Status New      OT LONG TERM GOAL #5  Title Pt will complete table top scanning with 75% accuracy in order to increase awareness to left hemisphere.    Baseline 24% accuracy -29/121 double digit cancellation    Time 12    Period Weeks    Status Achieved       OT LONG TERM GOAL #6   Title Patient will prepare a light snack with min A and adapted strategies PRN in order to increase independence with IADLs.    Baseline not preparing snacks    Time 12    Period Weeks    Status New      OT LONG TERM GOAL #7   Title Pt will utilize LUE as a stabilizer with no more than 2 verbal cues in order to increase independence and use of LUE.    Time 12    Period Weeks    Status New                 Plan - 09/11/20 1248    Clinical Impression Statement Pt reports increase in participation in ADLs. Pt with limited active movement in LUE impacting return to functional use.    OT Occupational Profile and History Detailed Assessment- Review of Records and additional review of physical, cognitive, psychosocial history related to current functional performance    Occupational performance deficits (Please refer to evaluation for details): IADL's;ADL's;Rest and Sleep;Education    Body Structure / Function / Physical Skills ADL;Strength;Gait;Decreased knowledge of use of DME;Balance;Pain;UE functional use;GMC;Dexterity;Endurance;IADL;ROM;Vision;Coordination;Flexibility;Mobility;Sensation;FMC;Decreased knowledge of precautions    Cognitive Skills Attention;Safety Awareness;Problem Solve;Understand;Sequencing    Rehab Potential Good    Clinical Decision Making Several treatment options, min-mod task modification necessary    Comorbidities Affecting Occupational Performance: May have comorbidities impacting occupational performance    Modification or Assistance to Complete Evaluation  Min-Moderate modification of tasks or assist with assess necessary to complete eval    OT Frequency 2x / week    OT Duration 12 weeks    OT Treatment/Interventions Self-care/ADL training;Moist Heat;DME and/or AE instruction;Splinting;Balance training;Fluidtherapy;Gait Training;Therapeutic activities;Therapeutic exercise;Ultrasound;Electrical Stimulation;Energy conservation;Manual  Therapy;Patient/family education;Visual/perceptual remediation/compensation;Passive range of motion;Functional Mobility Training;Neuromuscular education    Plan clothing mangaement/standing balance, kitchen mobility/make snack (unable to get in kitchen this session)    Consulted and Agree with Plan of Care Patient;Family member/caregiver    Family Member Consulted Aunt           Patient will benefit from skilled therapeutic intervention in order to improve the following deficits and impairments:   Body Structure / Function / Physical Skills: ADL,Strength,Gait,Decreased knowledge of use of DME,Balance,Pain,UE functional use,GMC,Dexterity,Endurance,IADL,ROM,Vision,Coordination,Flexibility,Mobility,Sensation,FMC,Decreased knowledge of precautions Cognitive Skills: Attention,Safety Awareness,Problem Solve,Understand,Sequencing     Visit Diagnosis: Muscle weakness (generalized)  Other abnormalities of gait and mobility  Hemiplegia and hemiparesis following other cerebrovascular disease affecting left non-dominant side (HCC)  Acute pain of left shoulder  Stiffness of left shoulder, not elsewhere classified  Other lack of coordination  Attention and concentration deficit    Problem List Patient Active Problem List   Diagnosis Date Noted  . Uncontrolled type 2 diabetes mellitus with hyperglycemia, without long-term current use of insulin (HCC) 04/16/2020  . CVA (cerebral vascular accident) (HCC) 04/15/2020  . Cocaine use 04/15/2020  . Acute CVA (cerebrovascular accident) (HCC) 03/04/2020  . Stroke (cerebrum) (HCC) 03/03/2020  . Weakness   . Noncompliance with medication regimen   . TIA (transient ischemic attack) 01/22/2019  . Diabetes mellitus due to underlying condition without complications (HCC) 05/30/2014  . Essential hypertension, benign 05/30/2014  . Smoking 05/30/2014  Junious Dresser MOT, OTR/L  09/11/2020, 1:11 PM  [facility] [address] [place], [place], [address] Phone: [phone]   Fax:  [phone]  Name: Ebony Matthews MRN: [id number] Date of Birth: 1981/07/21

## 2020-09-13 ENCOUNTER — Encounter: Payer: Self-pay | Admitting: Physical Therapy

## 2020-09-13 ENCOUNTER — Encounter: Payer: Self-pay | Admitting: Occupational Therapy

## 2020-09-13 ENCOUNTER — Ambulatory Visit: Payer: Medicaid Other | Admitting: Occupational Therapy

## 2020-09-13 ENCOUNTER — Other Ambulatory Visit: Payer: Self-pay

## 2020-09-13 ENCOUNTER — Ambulatory Visit: Payer: Medicaid Other | Admitting: Physical Therapy

## 2020-09-13 DIAGNOSIS — R278 Other lack of coordination: Secondary | ICD-10-CM

## 2020-09-13 DIAGNOSIS — M6281 Muscle weakness (generalized): Secondary | ICD-10-CM

## 2020-09-13 DIAGNOSIS — R2689 Other abnormalities of gait and mobility: Secondary | ICD-10-CM

## 2020-09-13 DIAGNOSIS — M25612 Stiffness of left shoulder, not elsewhere classified: Secondary | ICD-10-CM

## 2020-09-13 DIAGNOSIS — I69854 Hemiplegia and hemiparesis following other cerebrovascular disease affecting left non-dominant side: Secondary | ICD-10-CM

## 2020-09-13 DIAGNOSIS — R41840 Attention and concentration deficit: Secondary | ICD-10-CM

## 2020-09-13 DIAGNOSIS — M25512 Pain in left shoulder: Secondary | ICD-10-CM

## 2020-09-13 NOTE — Therapy (Signed)
[facility] [address] [place], [place], [address] Phone: [phone]   Fax:  [phone]  Occupational Therapy Treatment  Patient Details  Name: Ebony Matthews MRN: [id number] Date of Birth: [birth year] Referring Provider (OT): Dr. Everlena Cooper   Encounter Date: 09/13/2020   OT End of Session - 09/13/20 1146    Visit Number 16    Number of Visits 25    Date for OT Re-Evaluation 10/05/20   extended renewal date d/t scheduling and not at end of POC   Authorization Type [employer]    Authorization Time Period VL:MN - auth required after 25th visit (Week 9 of 12)    Authorization - Visit Number 16    Authorization - Number of Visits 25    OT Start Time 1146    OT Stop Time 1230    OT Time Calculation (min) 44 min    Equipment Utilized During Treatment gait belt    Activity Tolerance Patient tolerated treatment well    Behavior During Therapy [facility] for tasks assessed/performed;Flat affect           Past Medical History:  Diagnosis Date  . Diabetes mellitus without complication (HCC)   . Hypertension   . TIA (transient ischemic attack) 01/22/2019    Past Surgical History:  Procedure Laterality Date  . NO PAST SURGERIES      There were no vitals filed for this visit.   Subjective Assessment - 09/13/20 1147    Subjective  Pt denies any pain or changes.    Patient is accompanied by: Family member   aunt   Pertinent History PMH of CVA 2020, CVA June 2021 (R cerebellar) HTN, IIDM, HLD, on going cocaine/polysubstance abuse, non-compliant with medications and presented with new onset of left arm weakness and numbness.    Limitations Fall Risk. Not Driving. Left Inattention    Patient Stated Goals "to be able to move my arms" and "to be able to hold on and grip on stuff" with LUE    Currently in Pain? No/denies                        OT Treatments/Exercises (OP) - 09/13/20 1203      Transfers   Comments  ambulation with hemi walker with supervision and stand by assistance this day from back of gym to ADL kitchen - approx 50 feet      ADLs   Toileting simulated clothing management with dynamic balance with reaching down to obtain resistance clothespins from pant legs x 12 with SBA for balance and stability. LOB x 2 to back with recover.    Functional Mobility ambulation around kitchen with hemi walker and with supervision and verbal cues for strategies. Pt obtained cup from cabinet and got water and obtained item from refrigerator.    Home Maintenance worked on Product manager this day at table.    ADL Comments standing balance - static without UE support x 4 minutes with placing resistance clothespins on elevated antenna with RUE with SBA For stability                    OT Short Term Goals - 09/07/20 1115      OT SHORT TERM GOAL #1   Title Pt will be independent with HEP 07/16/2020    Baseline HEP not issued yet    Time 4    Period Weeks    Status On-going    Target  Date 07/16/20      OT SHORT TERM GOAL #2   Title Pt will perform UB dressing with min A and implementing hemitechniques and adapted strategies PRN in order to increase independence with ADLs    Baseline max A for UB dressing    Time 4    Period Weeks    Status On-going   mod  A 11/9     OT SHORT TERM GOAL #3   Title Pt will perform bathing with mod A and implementing adapted strategies PRN in order to increase independence with ADLs    Baseline total A currently for all bathing    Time 4    Period Weeks    Status Achieved   pt reports she is doing most of the wiping of her body     OT SHORT TERM GOAL #4   Title Patient will demonstrate safe positioning and handling for LUE during transfers with min verbal cues 4/5 trials in order to decrease pain and risk of injury.    Baseline currently severe left inattention to LUE    Time 4    Period Weeks    Status On-going      OT SHORT TERM GOAL #5   Title Pt  will complete table top scanning with 50 % accuracy in order to increase awareness to left hemisphere.    Baseline 29/121 or approx 24%    Time 4    Period Weeks    Status Achieved      OT SHORT TERM GOAL #6   Title Patient will prepare a light snack with mod A and adapted strategies PRN in order to increase independence with IADLs.    Baseline total A for snack prep    Time 4    Period Weeks    Status Deferred             OT Long Term Goals - 08/02/20 1214      OT LONG TERM GOAL #1   Title Pt will be independent with updated HEP 08/13/20    Baseline not implemented .    Time 12    Period Weeks    Status New      OT LONG TERM GOAL #2   Title Pt will perform UB dressing with supervision and implementing hemitechniques and adapted strategies PRN in order to increase independence with ADLs    Baseline max A for UB dressing    Time 12    Period Weeks    Status New      OT LONG TERM GOAL #3   Title Pt will perform bathing with min A and implementing adapted strategies PRN in order to increase independence with ADLs    Baseline total A for bathing at evaluation    Time 12    Period Weeks    Status New      OT LONG TERM GOAL #4   Title Patient will demonstrate safe positioning and handling for LUE during transfers Independently (with no verbal cues) 4/5 trials in order to decrease pain and risk of injury.    Baseline currently left inattention to LUE    Time 12    Period Weeks    Status New      OT LONG TERM GOAL #5   Title Pt will complete table top scanning with 75% accuracy in order to increase awareness to left hemisphere.    Baseline 24% accuracy -29/121 double digit cancellation    Time 12  Period Weeks    Status Achieved      OT LONG TERM GOAL #6   Title Patient will prepare a light snack with min A and adapted strategies PRN in order to increase independence with IADLs.    Baseline not preparing snacks    Time 12    Period Weeks    Status New      OT  LONG TERM GOAL #7   Title Pt will utilize LUE as a stabilizer with no more than 2 verbal cues in order to increase independence and use of LUE.    Time 12    Period Weeks    Status New                 Plan - 09/13/20 1220    Clinical Impression Statement Pt demonstrated increased independence with IADLs with kitchen mobility and folding laundry this day. Pt motivated by improvement and increase in independence.    OT Occupational Profile and History Detailed Assessment- Review of Records and additional review of physical, cognitive, psychosocial history related to current functional performance    Occupational performance deficits (Please refer to evaluation for details): IADL's;ADL's;Rest and Sleep;Education    Body Structure / Function / Physical Skills ADL;Strength;Gait;Decreased knowledge of use of DME;Balance;Pain;UE functional use;GMC;Dexterity;Endurance;IADL;ROM;Vision;Coordination;Flexibility;Mobility;Sensation;FMC;Decreased knowledge of precautions    Cognitive Skills Attention;Safety Awareness;Problem Solve;Understand;Sequencing    Rehab Potential Good    Clinical Decision Making Several treatment options, min-mod task modification necessary    Comorbidities Affecting Occupational Performance: May have comorbidities impacting occupational performance    Modification or Assistance to Complete Evaluation  Min-Moderate modification of tasks or assist with assess necessary to complete eval    OT Frequency 2x / week    OT Duration 12 weeks    OT Treatment/Interventions Self-care/ADL training;Moist Heat;DME and/or AE instruction;Splinting;Balance training;Fluidtherapy;Gait Training;Therapeutic activities;Therapeutic exercise;Ultrasound;Electrical Stimulation;Energy conservation;Manual Therapy;Patient/family education;Visual/perceptual remediation/compensation;Passive range of motion;Functional Mobility Training;Neuromuscular education    Plan kitchen mobility and prepare light snack  or eggs    Consulted and Agree with Plan of Care Patient;Family member/caregiver    Family Member Consulted Aunt           Patient will benefit from skilled therapeutic intervention in order to improve the following deficits and impairments:   Body Structure / Function / Physical Skills: ADL,Strength,Gait,Decreased knowledge of use of DME,Balance,Pain,UE functional use,GMC,Dexterity,Endurance,IADL,ROM,Vision,Coordination,Flexibility,Mobility,Sensation,FMC,Decreased knowledge of precautions Cognitive Skills: Attention,Safety Awareness,Problem Solve,Understand,Sequencing     Visit Diagnosis: Muscle weakness (generalized)  Other abnormalities of gait and mobility  Hemiplegia and hemiparesis following other cerebrovascular disease affecting left non-dominant side (HCC)  Acute pain of left shoulder  Stiffness of left shoulder, not elsewhere classified  Other lack of coordination  Attention and concentration deficit    Problem List Patient Active Problem List   Diagnosis Date Noted  . Uncontrolled type 2 diabetes mellitus with hyperglycemia, without long-term current use of insulin (HCC) 04/16/2020  . CVA (cerebral vascular accident) (HCC) 04/15/2020  . Cocaine use 04/15/2020  . Acute CVA (cerebrovascular accident) (HCC) 03/04/2020  . Stroke (cerebrum) (HCC) 03/03/2020  . Weakness   . Noncompliance with medication regimen   . TIA (transient ischemic attack) 01/22/2019  . Diabetes mellitus due to underlying condition without complications (HCC) 05/30/2014  . Essential hypertension, benign 05/30/2014  . Smoking 05/30/2014    Junious Dresser MOT, OTR/L  09/13/2020, 12:30 PM  [facility] [address] [place], [place], [address] Phone: [phone]   Fax:  [phone]  Name: Ebony Matthews MRN:  [id number] Date of Birth: 30-Mar-1981

## 2020-09-14 NOTE — Therapy (Signed)
[facility] [address] [place], [place], [address] Phone: [phone]   Fax:  [phone]  Physical Therapy Treatment  Patient Details  Name: Ebony Matthews MRN: [id number] Date of Birth: 30-Nov-1980 Referring Provider (PT): Referred by: Georgiann Cocker, MD. Followed by Shon Millet, MD   Encounter Date: 09/13/2020   PT End of Session - 09/13/20 1109    Visit Number 31    Date for PT Re-Evaluation 10/04/20    Authorization Type UHC    PT Start Time 1103    PT Stop Time 1145    PT Time Calculation (min) 42 min    Equipment Utilized During Treatment Gait belt;Other (comment)   pt's custom AFO   Activity Tolerance Patient tolerated treatment well    Behavior During Therapy WFL for tasks assessed/performed           Past Medical History:  Diagnosis Date  . Diabetes mellitus without complication (HCC)   . Hypertension   . TIA (transient ischemic attack) 01/22/2019    Past Surgical History:  Procedure Laterality Date  . NO PAST SURGERIES      There were no vitals filed for this visit.   Subjective Assessment - 09/13/20 1108    Subjective No new complaints. No falls or pain to report.    Patient is accompained by: Family member   aunt [name]   Pertinent History hx of multiple CVA, DM, HTN, drug abuse    Limitations Standing;Walking;House hold activities;Lifting    How long can you sit comfortably? no issues    How long can you stand comfortably? <30 sec    How long can you walk comfortably? <10 feet with quad cane    Patient Stated Goals Get my balance better.    Currently in Pain? No/denies    Pain Score 0-No pain                [facility] Adult PT Treatment/Exercise - 09/13/20 1110      Transfers   Transfers Sit to Stand;Stand to Sit    Sit to Stand 4: Min guard;With upper extremity assist;From bed;From chair/3-in-1    Sit to Stand Details Verbal cues for sequencing;Verbal cues for technique;Verbal  cues for safe use of DME/AE;Manual facilitation for weight shifting;Manual facilitation for placement    Sit to Stand Details (indicate cue type and reason) reminder cues for hand placement and LE placement prior to standing. cues for increased anterior weight shifting,    Stand to Sit 4: Min guard;With upper extremity assist;To bed;To chair/3-in-1    Stand to Sit Details (indicate cue type and reason) Verbal cues for sequencing;Verbal cues for technique;Verbal cues for safe use of DME/AE    Stand to Sit Details cues to reach back to control descent with sitting down.      Ambulation/Gait   Ambulation/Gait Yes    Ambulation/Gait Assistance 4: Min guard    Ambulation/Gait Assistance Details cues for left step placement, increased base of support, posture and weight shifting with gait.    Ambulation Distance (Feet) 115 Feet   x1, plus around gym with session   Assistive device Hemi-walker    Gait Pattern Step-to pattern;Decreased arm swing - left;Decreased arm swing - right;Decreased step length - left;Decreased hip/knee flexion - left;Decreased dorsiflexion - left;Decreased weight shift to left;Decreased stance time - left;Wide base of support    Ambulation Surface Level;Indoor      High Level Balance   High Level Balance Activities Side stepping  High Level Balance Comments on blue mat in parallel bars for 3 laps toward each direction wtih cues for step length, step placement, posture and weight shifting. min assist needed for balance with single UE support and left LE placement with stepping at times.      Knee/Hip Exercises: Aerobic   Other Aerobic Scifit LE's only on level  2.5 for 6 minutes working on full range of motion and strengthening.      Knee/Hip Exercises: Machines for Strengthening   Cybex Leg Press bil LE"s at 80#'s for 10 reps with assist to control essentric return. then left LE only 20#'s for 10 reps wtih assist needed for controlled movements both ways.                Balance Exercises - 09/14/20 0001      Balance Exercises: Standing   Standing Eyes Closed Wide (BOA);Foam/compliant surface;3 reps;30 secs;Limitations    Standing Eyes Closed Limitations on airex with feet apart, no UE supports with occasional touch to bars for balance, assist/cues for left knee extension in stance and weight shifting for equal LE weight bearing for EC 30 sec's x 3 reps.               PT Short Term Goals - 07/26/20 1024      PT SHORT TERM GOAL #1   Title Patient and caregiver will report walking daily in home for improved household mobility (ALL STGs Due: 07/27/2020)    Baseline continues to report not walking daily, currently walking approx 2-3x/week    Time 3    Period Weeks    Status On-going    Target Date 07/27/20      PT SHORT TERM GOAL #2   Title Patient will improve TUG to <1 min to demonstrate improved functional mobility    Baseline 1 min, 32 secs; 10/28 1 min, 17 secs    Time 3    Period Weeks    Status On-going      PT SHORT TERM GOAL #3   Title Patient will improve gait speed to > 1.0 ft sec to demonstrate improved household mobility    Baseline deferred as unable to walk without assistance    Time 3    Period Weeks    Status Deferred             PT Long Term Goals - 09/14/20 1140      PT LONG TERM GOAL #1   Title Patient will report independence with progressive HEP with caregiver asssistance (ALL LTG's Due: 08/17/20- dated extended to 09/20/2020 per PT due to being on hold, this is the 6th week)    Baseline continue to progress HEP    Time 6    Period Weeks    Status New      PT LONG TERM GOAL #2   Title Patient will demo ability to ambulate >250 ft with LRAD and CGA to demo improved functional mobility and ambulation within the home    Baseline 14 ft, Min A; 207' with CGA and hemi walker    Time 6    Period Weeks    Status New      PT LONG TERM GOAL #3   Title Patient will demo ability to complete TUG  < 50  seconds to demo improved ambulation and mobility    Baseline 1 min 32 secs    Time 6    Period Weeks    Status Revised  PT LONG TERM GOAL #4   Title Patient will improve gait speed to >/= 1.5 ft/sec to demonstrate improved mobility    Baseline not yet assessed    Time 6    Period Weeks    Status New                 Plan - 09/13/20 1110    Clinical Impression Statement Today's skilled session continued to focus on gait, strengthening and balance with up to min assist needed for balance. Pt continues to need cues/facilitation for increased weight bearing/shifting onto left LE in stance/gait. The pt is progressing toward goals and should benefit from continued PT to progress toward unmet goals.    Personal Factors and Comorbidities Comorbidity 3+;Past/Current Experience;Time since onset of injury/illness/exacerbation;Transportation;Behavior Pattern    Comorbidities DM, HTN, hx of multiple strokes in last 6 months    Examination-Activity Limitations Bathing;Bed Mobility;Caring for Others;Carry;Dressing;Hygiene/Grooming;Lift;Reach Overhead;Squat    Examination-Participation Restrictions Cleaning;Community Activity;Driving;Laundry;Yard Work;Occupation;Church;Meal Prep    Stability/Clinical Decision Making Evolving/Moderate complexity    Rehab Potential Good    PT Frequency 2x / week    PT Duration 6 weeks    PT Treatment/Interventions ADLs/Self Care Home Management;Gait training;Stair training;Functional mobility training;Therapeutic activities;Therapeutic exercise;Balance training;Neuromuscular re-education;Manual techniques;Orthotic Fit/Training;Patient/family education;Cognitive remediation;Passive range of motion;Energy conservation;DME Instruction;Electrical Stimulation;Joint Manipulations    PT Next Visit Plan Continue activites to promote quad actvitiation. transfer training and gait training with pt's personal AFO. continue with balance activities on complaint surfaces    PT  Home Exercise Plan seated hip march, bridge, transfer training (bed, commode), PROM L ankle; pt to work on standing at counter with L weightshift    Consulted and Agree with Plan of Care Patient;Family member/caregiver    Family Member Consulted Aunt           Patient will benefit from skilled therapeutic intervention in order to improve the following deficits and impairments:  Abnormal gait,Decreased activity tolerance,Decreased balance,Decreased cognition,Decreased coordination,Decreased endurance,Decreased knowledge of precautions,Decreased mobility,Decreased range of motion,Decreased safety awareness,Decreased strength,Difficulty walking,Impaired perceived functional ability,Impaired flexibility,Impaired sensation,Impaired tone,Impaired UE functional use,Postural dysfunction,Pain  Visit Diagnosis: Muscle weakness (generalized)  Other abnormalities of gait and mobility  Hemiplegia and hemiparesis following other cerebrovascular disease affecting left non-dominant side [facility])     Problem List Patient Active Problem List   Diagnosis Date Noted  . Uncontrolled type 2 diabetes mellitus with hyperglycemia, without long-term current use of insulin (HCC) 04/16/2020  . CVA (cerebral vascular accident) (HCC) 04/15/2020  . Cocaine use 04/15/2020  . Acute CVA (cerebrovascular accident) (HCC) 03/04/2020  . Stroke (cerebrum) (HCC) 03/03/2020  . Weakness   . Noncompliance with medication regimen   . TIA (transient ischemic attack) 01/22/2019  . Diabetes mellitus due to underlying condition without complications (HCC) 05/30/2014  . Essential hypertension, benign 05/30/2014  . Smoking 05/30/2014    Sallyanne Kuster, PTA, [facility] [address], [address] [place], [place] [address] [phone] 09/14/20, 11:46 AM   Name: Marilou Barnfield MRN: [id number] Date of Birth: [birth year]

## 2020-09-17 ENCOUNTER — Ambulatory Visit: Payer: Medicaid Other | Admitting: Physical Therapy

## 2020-09-17 ENCOUNTER — Ambulatory Visit: Payer: Medicaid Other | Admitting: Occupational Therapy

## 2020-09-17 ENCOUNTER — Other Ambulatory Visit: Payer: Self-pay

## 2020-09-17 DIAGNOSIS — M25512 Pain in left shoulder: Secondary | ICD-10-CM

## 2020-09-17 DIAGNOSIS — R2689 Other abnormalities of gait and mobility: Secondary | ICD-10-CM

## 2020-09-17 DIAGNOSIS — M6281 Muscle weakness (generalized): Secondary | ICD-10-CM

## 2020-09-17 DIAGNOSIS — I69854 Hemiplegia and hemiparesis following other cerebrovascular disease affecting left non-dominant side: Secondary | ICD-10-CM

## 2020-09-17 DIAGNOSIS — R296 Repeated falls: Secondary | ICD-10-CM

## 2020-09-17 DIAGNOSIS — M25612 Stiffness of left shoulder, not elsewhere classified: Secondary | ICD-10-CM

## 2020-09-17 DIAGNOSIS — R41840 Attention and concentration deficit: Secondary | ICD-10-CM

## 2020-09-17 DIAGNOSIS — R2681 Unsteadiness on feet: Secondary | ICD-10-CM

## 2020-09-17 DIAGNOSIS — R278 Other lack of coordination: Secondary | ICD-10-CM

## 2020-09-17 NOTE — Therapy (Signed)
[facility] [address] [place], [place], [address] Phone: [phone]   Fax:  [phone]  Occupational Therapy Treatment  Patient Details  Name: Ebony Matthews MRN: [id number] Date of Birth: 1981/06/26 Referring Provider (OT): Dr. Everlena Cooper   Encounter Date: 09/17/2020   OT End of Session - 09/17/20 1020    Visit Number 17    Number of Visits 25    Date for OT Re-Evaluation 10/05/20   extended renewal date d/t scheduling and not at end of POC   Authorization Type UHC    Authorization Time Period VL:MN - auth required after 25th visit (Week 9 of 12)    Authorization - Visit Number 17    Authorization - Number of Visits 25    OT Start Time 1018    OT Stop Time 1100    OT Time Calculation (min) 42 min    Equipment Utilized During Treatment gait belt    Activity Tolerance Patient tolerated treatment well    Behavior During Therapy WFL for tasks assessed/performed;Flat affect           Past Medical History:  Diagnosis Date  . Diabetes mellitus without complication (HCC)   . Hypertension   . TIA (transient ischemic attack) 01/22/2019    Past Surgical History:  Procedure Laterality Date  . NO PAST SURGERIES      There were no vitals filed for this visit.   Subjective Assessment - 09/17/20 1021    Subjective  Pt reports "I went to the bathroom by myself twice"    Patient is accompanied by: Family member   aunt   Pertinent History PMH of CVA 2020, CVA June 2021 (R cerebellar) HTN, IIDM, HLD, on going cocaine/polysubstance abuse, non-compliant with medications and presented with new onset of left arm weakness and numbness.    Limitations Fall Risk. Not Driving. Left Inattention    Patient Stated Goals "to be able to move my arms" and "to be able to hold on and grip on stuff" with LUE    Currently in Pain? No/denies                        OT Treatments/Exercises (OP) - 09/17/20 1036      ADLs    Toileting pt reports taking herself to the toilet x 2 with mod I this weekend with no safety concerns and no fear of falling. Pt was able to complete clothing management and hygiene appropriately. pt reports using BSC over toilet for increased safety    ADL Comments weight shifting at counter with placing varying pegs while standing at counter top with SBA      Neurological Re-education Exercises   Other Grasp and Release Exercises  working on neuroplasticity with LUE grasp release with trace to no activation noted but working on rebuilding pathways for active movement and PROM for tone      Electrical Stimulation   Electrical Stimulation Location LUE wrist extension    Electrical Stimulation Parameters NMES 10 x 10, 50 pps, 2.0 ramp, 10 minutes, level 20    Electrical Stimulation Goals Tone;Neuromuscular facilitation                    OT Short Term Goals - 09/17/20 1022      OT SHORT TERM GOAL #1   Title Pt will be independent with HEP 07/16/2020    Baseline HEP not issued yet    Time 4  Period Weeks    Status Achieved    Target Date 07/16/20      OT SHORT TERM GOAL #2   Title Pt will perform UB dressing with min A and implementing hemitechniques and adapted strategies PRN in order to increase independence with ADLs    Baseline max A for UB dressing    Time 4    Period Weeks    Status On-going   mod  A 11/9     OT SHORT TERM GOAL #3   Title Pt will perform bathing with mod A and implementing adapted strategies PRN in order to increase independence with ADLs    Baseline total A currently for all bathing    Time 4    Period Weeks    Status Achieved   pt reports she is doing most of the wiping of her body     OT SHORT TERM GOAL #4   Title Patient will demonstrate safe positioning and handling for LUE during transfers with min verbal cues 4/5 trials in order to decrease pain and risk of injury.    Baseline currently severe left inattention to LUE    Time 4     Period Weeks    Status Achieved      OT SHORT TERM GOAL #5   Title Pt will complete table top scanning with 50 % accuracy in order to increase awareness to left hemisphere.    Baseline 29/121 or approx 24%    Time 4    Period Weeks    Status Achieved      OT SHORT TERM GOAL #6   Title Patient will prepare a light snack with mod A and adapted strategies PRN in order to increase independence with IADLs.    Baseline total A for snack prep    Time 4    Period Weeks    Status Deferred             OT Long Term Goals - 09/17/20 1023      OT LONG TERM GOAL #1   Title Pt will be independent with updated HEP 08/13/20    Baseline not implemented .    Time 12    Period Weeks    Status On-going      OT LONG TERM GOAL #2   Title Pt will perform UB dressing with supervision and implementing hemitechniques and adapted strategies PRN in order to increase independence with ADLs    Baseline max A for UB dressing    Time 12    Period Weeks    Status On-going      OT LONG TERM GOAL #3   Title Pt will perform bathing with min A and implementing adapted strategies PRN in order to increase independence with ADLs    Baseline total A for bathing at evaluation    Time 12    Period Weeks    Status On-going      OT LONG TERM GOAL #4   Title Patient will demonstrate safe positioning and handling for LUE during transfers Independently (with no verbal cues) 4/5 trials in order to decrease pain and risk of injury.    Baseline currently left inattention to LUE    Time 12    Period Weeks    Status Achieved      OT LONG TERM GOAL #5   Title Pt will complete table top scanning with 75% accuracy in order to increase awareness to left hemisphere.    Baseline 24% accuracy -  29/121 double digit cancellation    Time 12    Period Weeks    Status Achieved      OT LONG TERM GOAL #6   Title Patient will prepare a light snack with min A and adapted strategies PRN in order to increase independence with  IADLs.    Baseline not preparing snacks    Time 12    Period Weeks    Status Achieved      OT LONG TERM GOAL #7   Title Pt will utilize LUE as a stabilizer with no more than 2 verbal cues in order to increase independence and use of LUE.    Time 12    Period Weeks    Status On-going                 Plan - 09/17/20 1121    Clinical Impression Statement Pt demonstrating much more improvement with participation and independence with ADLs and IADLs.    OT Occupational Profile and History Detailed Assessment- Review of Records and additional review of physical, cognitive, psychosocial history related to current functional performance    Occupational performance deficits (Please refer to evaluation for details): IADL's;ADL's;Rest and Sleep;Education    Body Structure / Function / Physical Skills ADL;Strength;Gait;Decreased knowledge of use of DME;Balance;Pain;UE functional use;GMC;Dexterity;Endurance;IADL;ROM;Vision;Coordination;Flexibility;Mobility;Sensation;FMC;Decreased knowledge of precautions    Cognitive Skills Attention;Safety Awareness;Problem Solve;Understand;Sequencing    Rehab Potential Good    Clinical Decision Making Several treatment options, min-mod task modification necessary    Comorbidities Affecting Occupational Performance: May have comorbidities impacting occupational performance    Modification or Assistance to Complete Evaluation  Min-Moderate modification of tasks or assist with assess necessary to complete eval    OT Frequency 2x / week    OT Duration 12 weeks    OT Treatment/Interventions Self-care/ADL training;Moist Heat;DME and/or AE instruction;Splinting;Balance training;Fluidtherapy;Gait Training;Therapeutic activities;Therapeutic exercise;Ultrasound;Electrical Stimulation;Energy conservation;Manual Therapy;Patient/family education;Visual/perceptual remediation/compensation;Passive range of motion;Functional Mobility Training;Neuromuscular education    Plan  kitchen mobility and prepare light snack or eggs    Consulted and Agree with Plan of Care Patient;Family member/caregiver    Family Member Consulted Aunt           Patient will benefit from skilled therapeutic intervention in order to improve the following deficits and impairments:   Body Structure / Function / Physical Skills: ADL,Strength,Gait,Decreased knowledge of use of DME,Balance,Pain,UE functional use,GMC,Dexterity,Endurance,IADL,ROM,Vision,Coordination,Flexibility,Mobility,Sensation,FMC,Decreased knowledge of precautions Cognitive Skills: Attention,Safety Awareness,Problem Solve,Understand,Sequencing     Visit Diagnosis: Muscle weakness (generalized)  Other abnormalities of gait and mobility  Hemiplegia and hemiparesis following other cerebrovascular disease affecting left non-dominant side (HCC)  Acute pain of left shoulder  Stiffness of left shoulder, not elsewhere classified  Other lack of coordination  Attention and concentration deficit    Problem List Patient Active Problem List   Diagnosis Date Noted  . Uncontrolled type 2 diabetes mellitus with hyperglycemia, without long-term current use of insulin (HCC) 04/16/2020  . CVA (cerebral vascular accident) (HCC) 04/15/2020  . Cocaine use 04/15/2020  . Acute CVA (cerebrovascular accident) (HCC) 03/04/2020  . Stroke (cerebrum) (HCC) 03/03/2020  . Weakness   . Noncompliance with medication regimen   . TIA (transient ischemic attack) 01/22/2019  . Diabetes mellitus due to underlying condition without complications (HCC) 05/30/2014  . Essential hypertension, benign 05/30/2014  . Smoking 05/30/2014    Junious Dresser MOT, OTR/L  09/17/2020, 11:22 AM  [facility] [address] [place], [place], [address] Phone: [phone]   Fax:  [phone]  Name:  Ebony Matthews MRN: [id number] Date of Birth: 04/23/1981

## 2020-09-17 NOTE — Therapy (Signed)
[facility] [address] [place], [place], [address] Phone: [phone]   Fax:  [phone]  Physical Therapy Treatment and Re-Certification  Patient Details  Name: Ebony Matthews MRN: [id number] Date of Birth: 1981/04/02 Referring Provider (PT): Referred by: Jordan Hawks, MD. Followed by Metta Clines, MD   Encounter Date: 09/17/2020   PT End of Session - 09/17/20 1148    Visit Number 32    Date for PT Re-Evaluation 10/15/20    Authorization Type [employer]    PT Start Time 1100    PT Stop Time 6962    PT Time Calculation (min) 45 min    Equipment Utilized During Treatment Gait belt;Other (comment)   pt's custom AFO   Activity Tolerance Patient tolerated treatment well    Behavior During Therapy WFL for tasks assessed/performed           Past Medical History:  Diagnosis Date   Diabetes mellitus without complication ([facility])    Hypertension    TIA (transient ischemic attack) 01/22/2019    Past Surgical History:  Procedure Laterality Date   NO PAST SURGERIES      There were no vitals filed for this visit.   Subjective Assessment - 09/17/20 1102    Subjective Pt states she went to the bathroom on her own. No falls reported.    Patient is accompained by: Family member    Pertinent History hx of multiple CVA, DM, HTN, drug abuse    Limitations Standing;Walking;House hold activities;Lifting    How long can you sit comfortably? no issues    How long can you stand comfortably? <30 sec    How long can you walk comfortably? <10 feet with quad cane    Patient Stated Goals Get my balance better.    Currently in Pain? No/denies              [facility] PT Assessment - 09/17/20 0001      Ambulation/Gait   Ambulation Distance (Feet) 345 Feet; pt required >10 minutes to complete this but was not demonstrating any fatigue   Assistive device Hemi-walker    Gait Pattern Decreased arm swing - left;Decreased arm swing -  right;Decreased hip/knee flexion - left;Decreased dorsiflexion - left;Decreased weight shift to left;Decreased stance time - left;Wide base of support;Step-through pattern    Gait velocity 41.22 sec = 0.8 ft/sec      Timed Up and Go Test   Normal TUG (seconds) 53   1st trial: 62 sec; 2nd trial: 53.12 sec            Standing:  Weightshift L<>R min A for tactile cues and manual assist for L weight shift  R LE step up on foam pad x5 reps; min A for manua assist for L weight shift  Weightshift L<>R min A with R foot tap forward x10   Amb 3x10' no a/d min A for manual assist for L LE weight shift and cueing with mirror visual cues                  PT Short Term Goals - 09/17/20 1739      PT SHORT TERM GOAL #1   Title Patient and caregiver will report walking daily in home for improved household mobility (ALL STGs Due: 07/27/2020)    Baseline continues to report not walking daily, currently walking approx 2-3x/week    Time 3    Period Weeks    Status Achieved    Target Date 07/27/20  PT SHORT TERM GOAL #2   Title Patient will improve TUG to <1 min to demonstrate improved functional mobility    Baseline 1 min, 32 secs; 10/28 1 min, 17 secs; 09/17/20  53 sec    Time 3    Period Weeks    Status Achieved      PT SHORT TERM GOAL #3   Title Patient will improve gait speed to > 1.0 ft sec to demonstrate improved household mobility    Baseline deferred as unable to walk without assistance    Time 3    Period Weeks    Status On-going             PT Long Term Goals - 09/17/20 1740      PT LONG TERM GOAL #1   Title Patient will report independence with progressive HEP with caregiver asssistance (ALL LTG's Due: 10/15/20)    Baseline continue to progress HEP    Time 4    Period Weeks    Status On-going    Target Date 10/15/20      PT LONG TERM GOAL #2   Title Patient will demo ability to ambulate >250 ft with LRAD and CGA to demo improved functional mobility  and ambulation within the home    Baseline 14 ft, Min A; 207' with CGA and hemi walker; 345' SBA with hemiwalker + L AFO    Time 6    Period Weeks    Status Achieved      PT LONG TERM GOAL #3   Title Patient will demo ability to complete TUG  < 50 seconds to demo improved ambulation and mobility    Baseline 1 min 32 secs    Time 4    Period Weeks    Status On-going    Target Date 10/15/20      PT LONG TERM GOAL #4   Title Patient will improve gait speed to >/= 1.5 ft/sec to demonstrate improved mobility    Baseline 0.8 ft/sec on 09/17/20    Time 4    Period Weeks    Status On-going    Target Date 10/15/20                 Plan - 09/17/20 1443    Clinical Impression Statement Treatment focused on re-checking pt's goals for re-certification. Pt is demonstrating increased initiative and independence now that she has her L AFO for improved stability. Pt with increasing gait speed and has met STGs #1 and #2 and LTG #2. Pt has partially met STG #3 and LTG #3 and #4. Pt states she would like to work on ambulating short distances with less reliance on her hemiwalker. Pt would benefit from additional PT to continue to optimize her level of function.    Personal Factors and Comorbidities Comorbidity 3+;Past/Current Experience;Time since onset of injury/illness/exacerbation;Transportation;Behavior Pattern    Comorbidities DM, HTN, hx of multiple strokes in last 6 months    Examination-Activity Limitations Bathing;Bed Mobility;Caring for Others;Carry;Dressing;Hygiene/Grooming;Lift;Reach Overhead;Squat    Examination-Participation Restrictions Cleaning;Community Activity;Driving;Laundry;Yard Work;Occupation;Church;Meal Prep    Stability/Clinical Decision Making Evolving/Moderate complexity    Rehab Potential Good    PT Frequency 2x / week    PT Duration 6 weeks    PT Treatment/Interventions ADLs/Self Care Home Management;Gait training;Stair training;Functional mobility training;Therapeutic  activities;Therapeutic exercise;Balance training;Neuromuscular re-education;Manual techniques;Orthotic Fit/Training;Patient/family education;Cognitive remediation;Passive range of motion;Energy conservation;DME Instruction;Electrical Stimulation;Joint Manipulations    PT Next Visit Plan Continue activites to promote quad actvitiation. transfer training and gait training with  pt's personal AFO. continue with balance activities on compliant surfaces and L side weight shift.    PT Home Exercise Plan seated hip march, bridge, transfer training (bed, commode), PROM L ankle; pt to work on standing at counter with L weightshift    Consulted and Agree with Plan of Care Patient;Family member/caregiver    Family Member Consulted Aunt           Patient will benefit from skilled therapeutic intervention in order to improve the following deficits and impairments:  Abnormal gait,Decreased activity tolerance,Decreased balance,Decreased cognition,Decreased coordination,Decreased endurance,Decreased knowledge of precautions,Decreased mobility,Decreased range of motion,Decreased safety awareness,Decreased strength,Difficulty walking,Impaired perceived functional ability,Impaired flexibility,Impaired sensation,Impaired tone,Impaired UE functional use,Postural dysfunction,Pain  Visit Diagnosis: Muscle weakness (generalized)  Other abnormalities of gait and mobility  Other lack of coordination  Unsteadiness on feet  Repeated falls     Problem List Patient Active Problem List   Diagnosis Date Noted   Uncontrolled type 2 diabetes mellitus with hyperglycemia, without long-term current use of insulin ([facility]) 04/16/2020   CVA (cerebral vascular accident) ([place]) 04/15/2020   Cocaine use 04/15/2020   Acute CVA (cerebrovascular accident) ([facility]) 03/04/2020   Stroke (cerebrum) ([facility]) 03/03/2020   Weakness    Noncompliance with medication regimen    TIA (transient ischemic attack) 01/22/2019   Diabetes  mellitus due to underlying condition without complications ([facility]) 74/25/9563   Essential hypertension, benign 05/30/2014   Smoking 05/30/2014    Everett Ricciardelli April Ma L Maygan Koeller PT, DPT 09/17/2020, 5:42 PM  [facility] [address] [place], [place], [address] Phone: [phone]   Fax:  [phone]  Name: Ebony Matthews MRN: [id number] Date of Birth: [birth year]

## 2020-09-19 ENCOUNTER — Encounter: Payer: Self-pay | Admitting: Occupational Therapy

## 2020-09-19 ENCOUNTER — Ambulatory Visit: Payer: Medicaid Other | Admitting: Occupational Therapy

## 2020-09-19 ENCOUNTER — Ambulatory Visit: Payer: Medicaid Other

## 2020-09-19 ENCOUNTER — Other Ambulatory Visit: Payer: Self-pay

## 2020-09-19 DIAGNOSIS — M6281 Muscle weakness (generalized): Secondary | ICD-10-CM | POA: Diagnosis not present

## 2020-09-19 DIAGNOSIS — R2689 Other abnormalities of gait and mobility: Secondary | ICD-10-CM

## 2020-09-19 DIAGNOSIS — M25612 Stiffness of left shoulder, not elsewhere classified: Secondary | ICD-10-CM

## 2020-09-19 DIAGNOSIS — R278 Other lack of coordination: Secondary | ICD-10-CM

## 2020-09-19 DIAGNOSIS — I69354 Hemiplegia and hemiparesis following cerebral infarction affecting left non-dominant side: Secondary | ICD-10-CM

## 2020-09-19 DIAGNOSIS — I69854 Hemiplegia and hemiparesis following other cerebrovascular disease affecting left non-dominant side: Secondary | ICD-10-CM

## 2020-09-19 DIAGNOSIS — R41840 Attention and concentration deficit: Secondary | ICD-10-CM

## 2020-09-19 DIAGNOSIS — R2681 Unsteadiness on feet: Secondary | ICD-10-CM

## 2020-09-19 DIAGNOSIS — M25512 Pain in left shoulder: Secondary | ICD-10-CM

## 2020-09-19 NOTE — Therapy (Signed)
[facility] [address] [place], [place], [address] Phone: [phone]   Fax:  [phone]  Physical Therapy Treatment  Patient Details  Name: Ebony Matthews MRN: [id number] Date of Birth: [birth year] Referring Provider (PT): Referred by: Georgiann Cocker, MD. Followed by Shon Millet, MD   Encounter Date: 09/19/2020   PT End of Session - 09/19/20 1106    Visit Number 33    Date for PT Re-Evaluation 10/15/20    Authorization Type UHC    PT Start Time 1102    PT Stop Time 1145    PT Time Calculation (min) 43 min    Equipment Utilized During Treatment Gait belt;Other (comment)   pt's custom AFO   Activity Tolerance Patient tolerated treatment well    Behavior During Therapy WFL for tasks assessed/performed           Past Medical History:  Diagnosis Date  . Diabetes mellitus without complication ([facility])   . Hypertension   . TIA (transient ischemic attack) 01/22/2019    Past Surgical History:  Procedure Laterality Date  . NO PAST SURGERIES      There were no vitals filed for this visit.   Subjective Assessment - 09/19/20 1106    Subjective Patietn reports continue to walk throughout house. No falls reported. Patietn reports wearing brace approx 2-3 hours a day.    Patient is accompained by: Family member    Pertinent History hx of multiple CVA, DM, HTN, drug abuse    Limitations Standing;Walking;House hold activities;Lifting    How long can you sit comfortably? no issues    How long can you stand comfortably? <30 sec    How long can you walk comfortably? <10 feet with quad cane    Patient Stated Goals Get my balance better.    Currently in Pain? No/denies                [facility] Adult PT Treatment/Exercise - 09/19/20 0001      Transfers   Transfers Sit to Stand;Stand to Sit    Sit to Stand 5: Supervision    Stand to Sit 5: Supervision    Comments completed x 5 reps throughout session, verbal cues for  LLE placement      Ambulation/Gait   Ambulation/Gait Yes    Ambulation/Gait Assistance 4: Min guard    Ambulation/Gait Assistance Details Completed ambulation with hemiwalker around therapy gym with activites. Completed ambulation in // bars working on improved weight shift to LLE and improved step length with RLE, as well as gradually reducing UE support as tolerated. Completed ambulation 5 x 10'. All ambulation completed with patient's personal L custom AFO donned.    Ambulation Distance (Feet) 50 Feet   clinic distance   Assistive device Hemi-walker    Gait Pattern Decreased arm swing - left;Decreased arm swing - right;Decreased hip/knee flexion - left;Decreased dorsiflexion - left;Decreased weight shift to left;Decreased stance time - left;Wide base of support;Step-through pattern    Ambulation Surface Level;Indoor      Neuro Re-ed    Neuro Re-ed Details  In // bars completed the following balance activites: standing without UE support on blue mat completed EC 3 x 30 seconds, completed horizontal/vertical head turns 2 x 10 reps. Intermittent CGA required with completion on complaint surface, and UE support required at times. On firm surface completed lateral weight shifts to R/L x 15 reps, PT providing manual faciliation for improved weight shift to L. All completed without UE support. Verbal  cues to stand tall with completion to promote improved activation of LLE. Also compelted steps forward/backward with RLE x 10 reps to allow for improved stance on LLE. CGA throughout with activites. continue to require verbal cues throughout completion for posture/technique.      Exercises   Exercises Knee/Hip      Knee/Hip Exercises: Aerobic   Other Aerobic Scifit LE's and RUE only, on level 2.5 for 5  minutes working on full range of motion and strengthening. PT providing assitance at lateral knee for improved tracking and alignment with completion.                    PT Short Term Goals -  09/17/20 1739      PT SHORT TERM GOAL #1   Title Patient and caregiver will report walking daily in home for improved household mobility (ALL STGs Due: 07/27/2020)    Baseline continues to report not walking daily, currently walking approx 2-3x/week    Time 3    Period Weeks    Status Achieved    Target Date 07/27/20      PT SHORT TERM GOAL #2   Title Patient will improve TUG to <1 min to demonstrate improved functional mobility    Baseline 1 min, 32 secs; 10/28 1 min, 17 secs; 09/17/20  53 sec    Time 3    Period Weeks    Status Achieved      PT SHORT TERM GOAL #3   Title Patient will improve gait speed to > 1.0 ft sec to demonstrate improved household mobility    Baseline deferred as unable to walk without assistance    Time 3    Period Weeks    Status On-going             PT Long Term Goals - 09/17/20 1740      PT LONG TERM GOAL #1   Title Patient will report independence with progressive HEP with caregiver asssistance (ALL LTG's Due: 10/15/20)    Baseline continue to progress HEP    Time 4    Period Weeks    Status On-going    Target Date 10/15/20      PT LONG TERM GOAL #2   Title Patient will demo ability to ambulate >250 ft with LRAD and CGA to demo improved functional mobility and ambulation within the home    Baseline 14 ft, Min A; 207' with CGA and hemi walker; 345' SBA with hemiwalker + L AFO    Time 6    Period Weeks    Status Achieved      PT LONG TERM GOAL #3   Title Patient will demo ability to complete TUG  < 50 seconds to demo improved ambulation and mobility    Baseline 1 min 32 secs    Time 4    Period Weeks    Status On-going    Target Date 10/15/20      PT LONG TERM GOAL #4   Title Patient will improve gait speed to >/= 1.5 ft/sec to demonstrate improved mobility    Baseline 0.8 ft/sec on 09/17/20    Time 4    Period Weeks    Status On-going    Target Date 10/15/20                 Plan - 09/19/20 1339    Clinical Impression  Statement Continued activites in // bars working toward improved weight shift onto LLE and improved  balance with complaint surface. Intermittent CGA required due to imbalance on complaint surfaces. Patient tolerating all activities well. Will continue per POC and progress toward all LTGs.    Personal Factors and Comorbidities Comorbidity 3+;Past/Current Experience;Time since onset of injury/illness/exacerbation;Transportation;Behavior Pattern    Comorbidities DM, HTN, hx of multiple strokes in last 6 months    Examination-Activity Limitations Bathing;Bed Mobility;Caring for Others;Carry;Dressing;Hygiene/Grooming;Lift;Reach Overhead;Squat    Examination-Participation Restrictions Cleaning;Community Activity;Driving;Laundry;Yard Work;Occupation;Church;Meal Prep    Stability/Clinical Decision Making Evolving/Moderate complexity    Rehab Potential Good    PT Frequency 2x / week    PT Duration 6 weeks    PT Treatment/Interventions ADLs/Self Care Home Management;Gait training;Stair training;Functional mobility training;Therapeutic activities;Therapeutic exercise;Balance training;Neuromuscular re-education;Manual techniques;Orthotic Fit/Training;Patient/family education;Cognitive remediation;Passive range of motion;Energy conservation;DME Instruction;Electrical Stimulation;Joint Manipulations    PT Next Visit Plan Continue activites to promote quad actvitiation. transfer training and gait training with pt's personal AFO. continue with balance activities on compliant surfaces and L side weight shift.    PT Home Exercise Plan seated hip march, bridge, transfer training (bed, commode), PROM L ankle; pt to work on standing at counter with L weightshift    Consulted and Agree with Plan of Care Patient;Family member/caregiver    Family Member Consulted Aunt           Patient will benefit from skilled therapeutic intervention in order to improve the following deficits and impairments:  Abnormal gait,Decreased  activity tolerance,Decreased balance,Decreased cognition,Decreased coordination,Decreased endurance,Decreased knowledge of precautions,Decreased mobility,Decreased range of motion,Decreased safety awareness,Decreased strength,Difficulty walking,Impaired perceived functional ability,Impaired flexibility,Impaired sensation,Impaired tone,Impaired UE functional use,Postural dysfunction,Pain  Visit Diagnosis: Muscle weakness (generalized)  Other abnormalities of gait and mobility  Unsteadiness on feet  Hemiplegia and hemiparesis following cerebral infarction affecting left non-dominant side [facility])     Problem List Patient Active Problem List   Diagnosis Date Noted  . Uncontrolled type 2 diabetes mellitus with hyperglycemia, without long-term current use of insulin (H[facility]) 04/16/2020  . CVA (cerebral vascular accident) ([facility]) 04/15/2020  . Cocaine use 04/15/2020  . Acute CVA (cerebrovascular accident) ([facility]) 03/04/2020  . Stroke (cerebrum) ([facility]) 03/03/2020  . Weakness   . Noncompliance with medication regimen   . TIA (transient ischemic attack) 01/22/2019  . Diabetes mellitus due to underlying condition without complications (HCC) 05/30/2014  . Essential hypertension, benign 05/30/2014  . Smoking 05/30/2014    Tempie Donning, PT, DPT 09/19/2020, 1:41 PM  [facility] [address] [place], [place], [address] Phone: [phone]   Fax:  [phone]  Name: Ebony Matthews MRN: [id number] Date of Birth: 1980/11/03

## 2020-09-19 NOTE — Therapy (Signed)
[facility] [address] [place], [place], [address] Phone: [phone]   Fax:  [phone]  Occupational Therapy Treatment  Patient Details  Name: Ebony Matthews MRN: [id number] Date of Birth: 02-17-1981 Referring Provider (OT): Dr. Everlena Cooper   Encounter Date: 09/19/2020   OT End of Session - 09/19/20 1020    Visit Number 18    Number of Visits 25    Date for OT Re-Evaluation 10/05/20   extended renewal date d/t scheduling and not at end of POC   Authorization Type [employer]    Authorization Time Period VL:MN - auth required after 25th visit (Week 10 of 12 09/19/20)    Authorization - Visit Number 18    Authorization - Number of Visits 25    OT Start Time 1018    OT Stop Time 1100    OT Time Calculation (min) 42 min    Equipment Utilized During Treatment gait belt    Activity Tolerance Patient tolerated treatment well    Behavior During Therapy [facility] for tasks assessed/performed;Flat affect           Past Medical History:  Diagnosis Date  . Diabetes mellitus without complication (HCC)   . Hypertension   . TIA (transient ischemic attack) 01/22/2019    Past Surgical History:  Procedure Laterality Date  . NO PAST SURGERIES      There were no vitals filed for this visit.   Subjective Assessment - 09/19/20 1020    Subjective  Pt denies any pain. Pt reports continued success with toileting with mod I. Denies any falls or safety concerns.    Patient is accompanied by: Family member   aunt   Pertinent History PMH of CVA 2020, CVA June 2021 (R cerebellar) HTN, IIDM, HLD, on going cocaine/polysubstance abuse, non-compliant with medications and presented with new onset of left arm weakness and numbness.    Limitations Fall Risk. Not Driving. Left Inattention    Patient Stated Goals "to be able to move my arms" and "to be able to hold on and grip on stuff" with LUE                        OT  Treatments/Exercises (OP) - 09/19/20 1025      ADLs   LB Dressing practicing different strategies for assisting with socks, AFOs and shoes. Pt able to cross LLE over R with verbal cues but unable to do any of the socks, shoes and AFO on L. Pt reports completing all on the RLE    Toileting continued reports of sucessful toileting with mod I with no safety concerns or falls.    Functional Mobility dynamic standing balance with reaching across midline and out of midline to place large pegs on elevated surface to left with RUE. Pt required verbal and tactile cues for positioning and weight shifting to left leg but was able to maintain balance appropriately. Completed again with weight bearing into LUE on elevated surface on table with assistance for maintaining hand position on surface for weight bearing.                    OT Short Term Goals - 09/17/20 1022      OT SHORT TERM GOAL #1   Title Pt will be independent with HEP 07/16/2020    Baseline HEP not issued yet    Time 4    Period Weeks    Status Achieved  Target Date 07/16/20      OT SHORT TERM GOAL #2   Title Pt will perform UB dressing with min A and implementing hemitechniques and adapted strategies PRN in order to increase independence with ADLs    Baseline max A for UB dressing    Time 4    Period Weeks    Status On-going   mod  A 11/9     OT SHORT TERM GOAL #3   Title Pt will perform bathing with mod A and implementing adapted strategies PRN in order to increase independence with ADLs    Baseline total A currently for all bathing    Time 4    Period Weeks    Status Achieved   pt reports she is doing most of the wiping of her body     OT SHORT TERM GOAL #4   Title Patient will demonstrate safe positioning and handling for LUE during transfers with min verbal cues 4/5 trials in order to decrease pain and risk of injury.    Baseline currently severe left inattention to LUE    Time 4    Period Weeks    Status  Achieved      OT SHORT TERM GOAL #5   Title Pt will complete table top scanning with 50 % accuracy in order to increase awareness to left hemisphere.    Baseline 29/121 or approx 24%    Time 4    Period Weeks    Status Achieved      OT SHORT TERM GOAL #6   Title Patient will prepare a light snack with mod A and adapted strategies PRN in order to increase independence with IADLs.    Baseline total A for snack prep    Time 4    Period Weeks    Status Deferred             OT Long Term Goals - 09/17/20 1023      OT LONG TERM GOAL #1   Title Pt will be independent with updated HEP 08/13/20    Baseline not implemented .    Time 12    Period Weeks    Status On-going      OT LONG TERM GOAL #2   Title Pt will perform UB dressing with supervision and implementing hemitechniques and adapted strategies PRN in order to increase independence with ADLs    Baseline max A for UB dressing    Time 12    Period Weeks    Status On-going      OT LONG TERM GOAL #3   Title Pt will perform bathing with min A and implementing adapted strategies PRN in order to increase independence with ADLs    Baseline total A for bathing at evaluation    Time 12    Period Weeks    Status On-going      OT LONG TERM GOAL #4   Title Patient will demonstrate safe positioning and handling for LUE during transfers Independently (with no verbal cues) 4/5 trials in order to decrease pain and risk of injury.    Baseline currently left inattention to LUE    Time 12    Period Weeks    Status Achieved      OT LONG TERM GOAL #5   Title Pt will complete table top scanning with 75% accuracy in order to increase awareness to left hemisphere.    Baseline 24% accuracy -29/121 double digit cancellation    Time 12  Period Weeks    Status Achieved      OT LONG TERM GOAL #6   Title Patient will prepare a light snack with min A and adapted strategies PRN in order to increase independence with IADLs.    Baseline not  preparing snacks    Time 12    Period Weeks    Status Achieved      OT LONG TERM GOAL #7   Title Pt will utilize LUE as a stabilizer with no more than 2 verbal cues in order to increase independence and use of LUE.    Time 12    Period Weeks    Status On-going                 Plan - 09/19/20 1339    Clinical Impression Statement Pt is making progress towards goals and has increased participation and indepenence with ADLs and IADLs    OT Occupational Profile and History Detailed Assessment- Review of Records and additional review of physical, cognitive, psychosocial history related to current functional performance    Occupational performance deficits (Please refer to evaluation for details): IADL's;ADL's;Rest and Sleep;Education    Body Structure / Function / Physical Skills ADL;Strength;Gait;Decreased knowledge of use of DME;Balance;Pain;UE functional use;GMC;Dexterity;Endurance;IADL;ROM;Vision;Coordination;Flexibility;Mobility;Sensation;FMC;Decreased knowledge of precautions    Cognitive Skills Attention;Safety Awareness;Problem Solve;Understand;Sequencing    Rehab Potential Good    Clinical Decision Making Several treatment options, min-mod task modification necessary    Comorbidities Affecting Occupational Performance: May have comorbidities impacting occupational performance    Modification or Assistance to Complete Evaluation  Min-Moderate modification of tasks or assist with assess necessary to complete eval    OT Frequency 2x / week    OT Duration 12 weeks    OT Treatment/Interventions Self-care/ADL training;Moist Heat;DME and/or AE instruction;Splinting;Balance training;Fluidtherapy;Gait Training;Therapeutic activities;Therapeutic exercise;Ultrasound;Electrical Stimulation;Energy conservation;Manual Therapy;Patient/family education;Visual/perceptual remediation/compensation;Passive range of motion;Functional Mobility Training;Neuromuscular education    Plan kitchen mobility  and prepare light snack or eggs    Consulted and Agree with Plan of Care Patient;Family member/caregiver    Family Member Consulted Aunt           Patient will benefit from skilled therapeutic intervention in order to improve the following deficits and impairments:   Body Structure / Function / Physical Skills: ADL,Strength,Gait,Decreased knowledge of use of DME,Balance,Pain,UE functional use,GMC,Dexterity,Endurance,IADL,ROM,Vision,Coordination,Flexibility,Mobility,Sensation,FMC,Decreased knowledge of precautions Cognitive Skills: Attention,Safety Awareness,Problem Solve,Understand,Sequencing     Visit Diagnosis: Muscle weakness (generalized)  Other abnormalities of gait and mobility  Hemiplegia and hemiparesis following other cerebrovascular disease affecting left non-dominant side (HCC)  Acute pain of left shoulder  Stiffness of left shoulder, not elsewhere classified  Other lack of coordination  Attention and concentration deficit    Problem List Patient Active Problem List   Diagnosis Date Noted  . Uncontrolled type 2 diabetes mellitus with hyperglycemia, without long-term current use of insulin (HCC) 04/16/2020  . CVA (cerebral vascular accident) (HCC) 04/15/2020  . Cocaine use 04/15/2020  . Acute CVA (cerebrovascular accident) (HCC) 03/04/2020  . Stroke (cerebrum) (HCC) 03/03/2020  . Weakness   . Noncompliance with medication regimen   . TIA (transient ischemic attack) 01/22/2019  . Diabetes mellitus due to underlying condition without complications (HCC) 05/30/2014  . Essential hypertension, benign 05/30/2014  . Smoking 05/30/2014    Junious Dresser MOT, OTR/L  09/19/2020, 1:40 PM  [facility] [address] [place], [place], [address] Phone: [phone]   Fax:  [phone]  Name: Ebony Matthews MRN: [id number] Date of Birth: 1980/10/06

## 2020-09-24 ENCOUNTER — Other Ambulatory Visit: Payer: Self-pay

## 2020-09-24 ENCOUNTER — Ambulatory Visit: Payer: Medicaid Other | Admitting: Occupational Therapy

## 2020-09-24 ENCOUNTER — Encounter: Payer: Self-pay | Admitting: Occupational Therapy

## 2020-09-24 ENCOUNTER — Ambulatory Visit: Payer: Medicaid Other | Admitting: Physical Therapy

## 2020-09-24 DIAGNOSIS — R2681 Unsteadiness on feet: Secondary | ICD-10-CM

## 2020-09-24 DIAGNOSIS — M6281 Muscle weakness (generalized): Secondary | ICD-10-CM

## 2020-09-24 DIAGNOSIS — I69854 Hemiplegia and hemiparesis following other cerebrovascular disease affecting left non-dominant side: Secondary | ICD-10-CM

## 2020-09-24 DIAGNOSIS — R278 Other lack of coordination: Secondary | ICD-10-CM

## 2020-09-24 DIAGNOSIS — I69354 Hemiplegia and hemiparesis following cerebral infarction affecting left non-dominant side: Secondary | ICD-10-CM

## 2020-09-24 DIAGNOSIS — R41840 Attention and concentration deficit: Secondary | ICD-10-CM

## 2020-09-24 DIAGNOSIS — R2689 Other abnormalities of gait and mobility: Secondary | ICD-10-CM

## 2020-09-24 DIAGNOSIS — M25512 Pain in left shoulder: Secondary | ICD-10-CM

## 2020-09-24 DIAGNOSIS — M25612 Stiffness of left shoulder, not elsewhere classified: Secondary | ICD-10-CM

## 2020-09-24 NOTE — Therapy (Signed)
[facility] [address] [place], [place], [address] Phone: [phone]   Fax:  [phone]  Physical Therapy Treatment  Patient Details  Name: Ebony Matthews MRN: [id number] Date of Birth: [birth year] Referring Provider (PT): Referred by: Georgiann Cocker, MD. Followed by Shon Millet, MD   Encounter Date: 09/24/2020   PT End of Session - 09/24/20 1014    Visit Number 34    Date for PT Re-Evaluation 10/15/20    Authorization Type UHC    PT Start Time 1015    PT Stop Time 1100    PT Time Calculation (min) 45 min    Equipment Utilized During Treatment Gait belt;Other (comment)   pt's custom AFO   Activity Tolerance Patient tolerated treatment well    Behavior During Therapy WFL for tasks assessed/performed           Past Medical History:  Diagnosis Date   Diabetes mellitus without complication (HCC)    Hypertension    TIA (transient ischemic attack) 01/22/2019    Past Surgical History:  Procedure Laterality Date   NO PAST SURGERIES      There were no vitals filed for this visit.   Subjective Assessment - 09/24/20 1018    Subjective Pt reports no falls. She states she hasn't been working on her leg exercises but has been working her hand exercises.    Patient is accompained by: Family member    Pertinent History hx of multiple CVA, DM, HTN, drug abuse    Limitations Standing;Walking;House hold activities;Lifting    How long can you sit comfortably? no issues    How long can you stand comfortably? <30 sec    How long can you walk comfortably? <10 feet with quad cane    Patient Stated Goals Get my balance better.              Standing:  Weightshift L 2x10 cues for hip towards counter/bar CGA for tactile cues and manual assist for L weight shift as needed; hold x30 sec  Weightshift L<>R x10 no UE support CGA for cueing  R LE step up on 2" step with 3x10 sec holds  Turning x4 with min A for verbal  and tactile cueing and assist for L LE weight shift  EC on foam feet apart 3x15 sec   Amb 3x20' no a/d CGA for tactile and verbal cueing for L LE weight shift and cueing with mirror visual cues  Seated:  Heel slide 3x10              PT Short Term Goals - 09/17/20 1739      PT SHORT TERM GOAL #1   Title Patient and caregiver will report walking daily in home for improved household mobility (ALL STGs Due: 07/27/2020)    Baseline continues to report not walking daily, currently walking approx 2-3x/week    Time 3    Period Weeks    Status Achieved    Target Date 07/27/20      PT SHORT TERM GOAL #2   Title Patient will improve TUG to <1 min to demonstrate improved functional mobility    Baseline 1 min, 32 secs; 10/28 1 min, 17 secs; 09/17/20  53 sec    Time 3    Period Weeks    Status Achieved      PT SHORT TERM GOAL #3   Title Patient will improve gait speed to > 1.0 ft sec to demonstrate improved household mobility  Baseline deferred as unable to walk without assistance    Time 3    Period Weeks    Status On-going             PT Long Term Goals - 09/17/20 1740      PT LONG TERM GOAL #1   Title Patient will report independence with progressive HEP with caregiver asssistance (ALL LTG's Due: 10/15/20)    Baseline continue to progress HEP    Time 4    Period Weeks    Status On-going    Target Date 10/15/20      PT LONG TERM GOAL #2   Title Patient will demo ability to ambulate >250 ft with LRAD and CGA to demo improved functional mobility and ambulation within the home    Baseline 14 ft, Min A; 207' with CGA and hemi walker; 345' SBA with hemiwalker + L AFO    Time 6    Period Weeks    Status Achieved      PT LONG TERM GOAL #3   Title Patient will demo ability to complete TUG  < 50 seconds to demo improved ambulation and mobility    Baseline 1 min 32 secs    Time 4    Period Weeks    Status On-going    Target Date 10/15/20      PT LONG TERM GOAL #4    Title Patient will improve gait speed to >/= 1.5 ft/sec to demonstrate improved mobility    Baseline 0.8 ft/sec on 09/17/20    Time 4    Period Weeks    Status On-going    Target Date 10/15/20                 Plan - 09/24/20 1515    Clinical Impression Statement Continued activities to encourage L quad activation and weight bearing. Continued to work on balance and ambulating without a/d. Pt tends to internally rotate R LE and externally rotate L LE leading to listing gait to the left -- pt encouraged to correct this while ambulating and to continue to work on her L LE weight shifting at home and heel slides/LAQs.    Personal Factors and Comorbidities Comorbidity 3+;Past/Current Experience;Time since onset of injury/illness/exacerbation;Transportation;Behavior Pattern    Comorbidities DM, HTN, hx of multiple strokes in last 6 months    Examination-Activity Limitations Bathing;Bed Mobility;Caring for Others;Carry;Dressing;Hygiene/Grooming;Lift;Reach Overhead;Squat    Examination-Participation Restrictions Cleaning;Community Activity;Driving;Laundry;Yard Work;Occupation;Church;Meal Prep    Stability/Clinical Decision Making Evolving/Moderate complexity    Rehab Potential Good    PT Frequency 2x / week    PT Duration 6 weeks    PT Treatment/Interventions ADLs/Self Care Home Management;Gait training;Stair training;Functional mobility training;Therapeutic activities;Therapeutic exercise;Balance training;Neuromuscular re-education;Manual techniques;Orthotic Fit/Training;Patient/family education;Cognitive remediation;Passive range of motion;Energy conservation;DME Instruction;Electrical Stimulation;Joint Manipulations    PT Next Visit Plan Continue activites to promote quad actvitiation. Gait training with pt's personal AFO. continue with balance activities on compliant surfaces and L side weight shift.    PT Home Exercise Plan pt to work on standing at counter with L weightshift and work on  keeping feet forward while ambulating with hemiwalker    Consulted and Agree with Plan of Care Patient;Family member/caregiver    Family Member Consulted Aunt           Patient will benefit from skilled therapeutic intervention in order to improve the following deficits and impairments:  Abnormal gait,Decreased activity tolerance,Decreased balance,Decreased cognition,Decreased coordination,Decreased endurance,Decreased knowledge of precautions,Decreased mobility,Decreased range of motion,Decreased safety awareness,Decreased strength,Difficulty  walking,Impaired perceived functional ability,Impaired flexibility,Impaired sensation,Impaired tone,Impaired UE functional use,Postural dysfunction,Pain  Visit Diagnosis: Muscle weakness (generalized)  Other abnormalities of gait and mobility  Hemiplegia and hemiparesis following other cerebrovascular disease affecting left non-dominant side (HCC)  Other lack of coordination  Unsteadiness on feet     Problem List Patient Active Problem List   Diagnosis Date Noted   Uncontrolled type 2 diabetes mellitus with hyperglycemia, without long-term current use of insulin (HCC) 04/16/2020   CVA (cerebral vascular accident) (HCC) 04/15/2020   Cocaine use 04/15/2020   Acute CVA (cerebrovascular accident) (HCC) 03/04/2020   Stroke (cerebrum) (HCC) 03/03/2020   Weakness    Noncompliance with medication regimen    TIA (transient ischemic attack) 01/22/2019   Diabetes mellitus due to underlying condition without complications (HCC) 05/30/2014   Essential hypertension, benign 05/30/2014   Smoking 05/30/2014    Areesha Dehaven April Ma L Copper Basnett PT, DPT 09/24/2020, 3:18 PM   [facility] [address] [place], [place], [address] Phone: [phone]   Fax:  [phone]  Name: Ebony Matthews MRN: [id number] Date of Birth: [birth year]

## 2020-09-24 NOTE — Progress Notes (Signed)
NEUROLOGY FOLLOW UP OFFICE NOTE  Ebony Matthews [id number]   Subjective:  Ebony Matthews is a 39 year old right-handed female with HTN and diabetes who follows up for stroke.  She is accompanied by her mother who provides collateral history.  UPDATE: Current medications:  atorvastatin 80mg , metformin and HCTZ She is not taking Plavix 75mg , lisinopril  I ordered a 30 day cardiac event monitor but she reports that she never got a call.  I also ordered a lipid panel but patient did not have this performed.  She has been out of work since her stroke and continues to undergo rehab.  She finds it helpful.  HISTORY: She was admitted to [facility] for 3 day history of left arm numbness and tingling and inability to grip with her left hand.  MRI of brain showed scattered multifocal subcentimeter acute ischemic infarcts involving the right frontal, parietal and temporal lobes and subcentimeter infarcts in left occipital lobe and left cerebellum.  CTA of head and neck showed narrow and tortuous bilateral cervical internal carotid arteries with mild dilation just inferior to the skull base concerning for fibromuscular dysplasia or atherosclerotic disease.  2D echocardiogram showed EF 60-65% with no cardiac source of emboli.  Labs showed LDL 202, Hgb A1c 10.5, negative Covid test, and UDS positive for cocaine and THC.  She was on ASA 325mg  daily prior to admission and was discharged on ASA 81mg  and Plavix 75mg  daily for 3 weeks, followed by ASA alone (not changed to Plavix as patient had been noncompliant on ASA).  She was on Lipitor 40mg  daily prior to admission and continued at discharge as she had been noncompliant.  Further testing for cardiac source of emboli, including TEE and loop recorder, was not performed as she was thought to not be a good candidate for anticoagulation due to noncompliance.    On 04/14/2020, she developed worsening left arm and new left leg weakness.  She was seen  in the hospital the following day.  MRI of brain showed new acute infarcts in the right cerebral hemisphere and small infarct in right pons.  CTA of head and neck showed new acute proximal right ICA occlusion with distal reconstitution at the supraclinoid segment via collateral flow across the circle-of-Willis.  Right MCA is perfused although attenuated as compared to the contralateral left MCA.  COVID testing was negative.  She was advised to remain on dual antiplatelet therapy for 3 months followed by Plavix alone.    She is undergoing physical therapy twice a week.  She has since quit smoking and has been compliant with her medication.  She works at [employer] but has been out of work since stroke in July.    PAST MEDICAL HISTORY: Past Medical History:  Diagnosis Date  . Diabetes mellitus without complication (HCC)   . Hypertension   . TIA (transient ischemic attack) 01/22/2019    MEDICATIONS: Current Outpatient Medications on File Prior to Visit  Medication Sig Dispense Refill  . aspirin EC 81 MG tablet Take 1 tablet (81 mg total) by mouth daily. (Patient not taking: Reported on 07/17/2020)    . atorvastatin (LIPITOR) 40 MG tablet Take 40 mg by mouth at bedtime. (Patient not taking: Reported on 07/17/2020)    . atorvastatin (LIPITOR) 80 MG tablet Take 0.5 tablets (40 mg total) by mouth daily at 6 PM. (Patient not taking: Reported on 06/06/2020) 30 tablet 0  . Cholecalciferol (VITAMIN D-3) 125 MCG (5000 UT) TABS Take 1 tablet by mouth  daily. (Patient not taking: Reported on 07/17/2020) 90 tablet 3  . clopidogrel (PLAVIX) 75 MG tablet Take 1 tablet (75 mg total) by mouth daily. X 21 days (Patient not taking: Reported on 07/17/2020) 21 tablet 0  . Empagliflozin (JARDIANCE PO) Take by mouth.    Marland Kitchen glipiZIDE (GLUCOTROL) 5 MG tablet Take 1 tablet (5 mg total) by mouth 2 (two) times daily before a meal. 60 tablet 0  . hydrochlorothiazide (MICROZIDE) 12.5 MG capsule Take 1 capsule (12.5 mg total)  by mouth daily. (Patient not taking: Reported on 07/17/2020) 30 capsule 0  . lisinopril (ZESTRIL) 20 MG tablet Take 1 tablet (20 mg total) by mouth daily. 30 tablet 0  . Menatetrenone (VITAMIN K2) 100 MCG TABS Take 100 mcg by mouth daily. (Patient not taking: Reported on 07/17/2020) 90 tablet 3  . metFORMIN (GLUCOPHAGE) 1000 MG tablet Take 1 tablet (1,000 mg total) by mouth 2 (two) times daily. (Patient not taking: Reported on 07/17/2020) 60 tablet 0  . naproxen sodium (ALEVE) 220 MG tablet Take 220-440 mg by mouth 2 (two) times daily as needed (for pain). (Patient not taking: Reported on 07/17/2020)    . Semaglutide (RYBELSUS PO) Take by mouth.     No current facility-administered medications on file prior to visit.    ALLERGIES: No Known Allergies  FAMILY HISTORY: Family History  Problem Relation Age of Onset  . Diabetes Other   . Hypertension Maternal Grandmother    SOCIAL HISTORY: Social History   Socioeconomic History  . Marital status: Single    Spouse name: Not on file  . Number of children: Not on file  . Years of education: Not on file  . Highest education level: Not on file  Occupational History  . Not on file  Tobacco Use  . Smoking status: Former Smoker    Packs/day: 1.00    Years: 15.00    Pack years: 15.00  . Smokeless tobacco: Never Used  Vaping Use  . Vaping Use: Never used  Substance and Sexual Activity  . Alcohol use: No  . Drug use: Yes    Types: Marijuana    Comment: occasional  . Sexual activity: Yes    Birth control/protection: None  Other Topics Concern  . Not on file  Social History Narrative   Right handed   Lives with friend one story home   Social Determinants of Health   Financial Resource Strain: Not on file  Food Insecurity: Not on file  Transportation Needs: Not on file  Physical Activity: Not on file  Stress: Not on file  Social Connections: Not on file  Intimate Partner Violence: Not on file     Objective:  Blood pressure  122/75, pulse 95, height 5\' 7"  (1.702 m), weight 194 lb 9.6 oz (88.3 kg), SpO2 93 %. General: No acute distress.  Patient appears well-groomed.   Head:  Normocephalic/atraumatic Eyes:  Fundi examined but not visualized Neck: supple, no paraspinal tenderness, full range of motion Heart:  Regular rate and rhythm Lungs:  Clear to auscultation bilaterally Back: No paraspinal tenderness Neurological Exam: alert and oriented to person, place, and time. Attention span and concentration intact, recent and remote memory intact, fund of knowledge intact.  Speech fluent and not dysarthric, language intact.  Right gaze preference.  Left lower facial weakness.  Otherwise, CN II-XII intact. Bulk and tone normal.  Left upper and lower extremity hemiplegia except 4-/5 left hip flexion;  5/5 right upper and lower extremities.  Sensation to light touch,  temperature and vibration sensation reduced in left side.  Deep tendon reflexes 3+ left upper and lower extremities, 2+ right upper and lower extremities.  Finger to nose intact.  Left hemiparetic gait.     Assessment/Plan:   1.  Bilateral embolic infarcts of unknown source, concern for cardiac source 2.  Right hemispheric embolic infarcts with right ICA occlusion 3.  Type 2 diabetes mellitus 4.  Hypertension 5.  Hyperlipidemia 6.  Cigarette smoker   Multiple stroke risk factors.  However, bilateral embolic strokes are suspicious for cardiac source.   1.  Secondary stroke prevention as managed by PCP. - Refilled Plavix 75mg  daily but further refills. - Atorvastatin 80mg  daily.  LDL goal less than 70 - Glycemic control.  Hgb A1c goal less than 7 - Blood pressure control 2.  Reorder 30 day cardiac event monitor.  Advised to contact me if no response in one week 3.  Smoking cessation 4.  Follow up 6 months.  , DO  CC: , NP

## 2020-09-24 NOTE — Therapy (Signed)
[facility] [address] [place], [place], [address] Phone: [phone]   Fax:  [phone]  Occupational Therapy Treatment  Patient Details  Name: Ebony Matthews MRN: [id number] Date of Birth: August 01, 1981 Referring Provider (OT): Dr. Everlena Cooper   Encounter Date: 09/24/2020   OT End of Session - 09/24/20 1109    Visit Number 19    Number of Visits 25    Date for OT Re-Evaluation 10/05/20   extended renewal date d/t scheduling and not at end of POC   Authorization Type [employer]    Authorization Time Period VL:MN - auth required after 25th visit (Week 10 of 12 09/19/20)    Authorization - Visit Number 19    Authorization - Number of Visits 25    OT Start Time 1110   late start   OT Stop Time 1143    OT Time Calculation (min) 33 min    Equipment Utilized During Treatment gait belt    Activity Tolerance Patient tolerated treatment well    Behavior During Therapy [facility] for tasks assessed/performed;Flat affect           Past Medical History:  Diagnosis Date  . Diabetes mellitus without complication (HCC)   . Hypertension   . TIA (transient ischemic attack) 01/22/2019    Past Surgical History:  Procedure Laterality Date  . NO PAST SURGERIES      There were no vitals filed for this visit.   Subjective Assessment - 09/24/20 1110    Subjective  Pt denies any pain. Pt reports having a good weekend. No reports of any changes or new.    Patient is accompanied by: Family member   aunt   Pertinent History PMH of CVA 2020, CVA June 2021 (R cerebellar) HTN, IIDM, HLD, on going cocaine/polysubstance abuse, non-compliant with medications and presented with new onset of left arm weakness and numbness.    Limitations Fall Risk. Not Driving. Left Inattention    Patient Stated Goals "to be able to move my arms" and "to be able to hold on and grip on stuff" with LUE    Currently in Pain? No/denies                         OT Treatments/Exercises (OP) - 09/24/20 0001      Neurological Re-education Exercises   Other Exercises 1 PROM and trying to facilitate active movement with LUE hand, elbow and shoulder    Development of Reach Hand over hand assistance    Hand over Hand Assistance while Reaching across table with self PROM x 10, forward reach, horizontal abudction, circumduction                    OT Short Term Goals - 09/17/20 1022      OT SHORT TERM GOAL #1   Title Pt will be independent with HEP 07/16/2020    Baseline HEP not issued yet    Time 4    Period Weeks    Status Achieved    Target Date 07/16/20      OT SHORT TERM GOAL #2   Title Pt will perform UB dressing with min A and implementing hemitechniques and adapted strategies PRN in order to increase independence with ADLs    Baseline max A for UB dressing    Time 4    Period Weeks    Status On-going   mod  A 11/9     OT SHORT  TERM GOAL #3   Title Pt will perform bathing with mod A and implementing adapted strategies PRN in order to increase independence with ADLs    Baseline total A currently for all bathing    Time 4    Period Weeks    Status Achieved   pt reports she is doing most of the wiping of her body     OT SHORT TERM GOAL #4   Title Patient will demonstrate safe positioning and handling for LUE during transfers with min verbal cues 4/5 trials in order to decrease pain and risk of injury.    Baseline currently severe left inattention to LUE    Time 4    Period Weeks    Status Achieved      OT SHORT TERM GOAL #5   Title Pt will complete table top scanning with 50 % accuracy in order to increase awareness to left hemisphere.    Baseline 29/121 or approx 24%    Time 4    Period Weeks    Status Achieved      OT SHORT TERM GOAL #6   Title Patient will prepare a light snack with mod A and adapted strategies PRN in order to increase independence with IADLs.    Baseline total A for snack prep    Time 4    Period  Weeks    Status Deferred             OT Long Term Goals - 09/24/20 1117      OT LONG TERM GOAL #1   Title Pt will be independent with updated HEP 08/13/20    Baseline not implemented .    Time 12    Period Weeks    Status On-going      OT LONG TERM GOAL #2   Title Pt will perform UB dressing with supervision and implementing hemitechniques and adapted strategies PRN in order to increase independence with ADLs    Baseline max A for UB dressing    Time 12    Period Weeks    Status On-going   min A with front open face jacket     OT LONG TERM GOAL #3   Title Pt will perform bathing with min A and implementing adapted strategies PRN in order to increase independence with ADLs    Baseline total A for bathing at evaluation    Time 12    Period Weeks    Status On-going      OT LONG TERM GOAL #4   Title Patient will demonstrate safe positioning and handling for LUE during transfers Independently (with no verbal cues) 4/5 trials in order to decrease pain and risk of injury.    Baseline currently left inattention to LUE    Time 12    Period Weeks    Status Achieved      OT LONG TERM GOAL #5   Title Pt will complete table top scanning with 75% accuracy in order to increase awareness to left hemisphere.    Baseline 24% accuracy -29/121 double digit cancellation    Time 12    Period Weeks    Status Achieved      OT LONG TERM GOAL #6   Title Patient will prepare a light snack with min A and adapted strategies PRN in order to increase independence with IADLs.    Baseline not preparing snacks    Time 12    Period Weeks    Status Achieved  OT LONG TERM GOAL #7   Title Pt will utilize LUE as a stabilizer with no more than 2 verbal cues in order to increase independence and use of LUE.    Time 12    Period Weeks    Status On-going                 Plan - 09/24/20 1110    Clinical Impression Statement Pt    OT Occupational Profile and History Detailed Assessment-  Review of Records and additional review of physical, cognitive, psychosocial history related to current functional performance    Occupational performance deficits (Please refer to evaluation for details): IADL's;ADL's;Rest and Sleep;Education    Body Structure / Function / Physical Skills ADL;Strength;Gait;Decreased knowledge of use of DME;Balance;Pain;UE functional use;GMC;Dexterity;Endurance;IADL;ROM;Vision;Coordination;Flexibility;Mobility;Sensation;FMC;Decreased knowledge of precautions    Cognitive Skills Attention;Safety Awareness;Problem Solve;Understand;Sequencing    Rehab Potential Good    Clinical Decision Making Several treatment options, min-mod task modification necessary    Comorbidities Affecting Occupational Performance: May have comorbidities impacting occupational performance    Modification or Assistance to Complete Evaluation  Min-Moderate modification of tasks or assist with assess necessary to complete eval    OT Frequency 2x / week    OT Duration 12 weeks    OT Treatment/Interventions Self-care/ADL training;Moist Heat;DME and/or AE instruction;Splinting;Balance training;Fluidtherapy;Gait Training;Therapeutic activities;Therapeutic exercise;Ultrasound;Electrical Stimulation;Energy conservation;Manual Therapy;Patient/family education;Visual/perceptual remediation/compensation;Passive range of motion;Functional Mobility Training;Neuromuscular education    Plan resting hand splint?, kitchen mobility, weight shifting in kitchen    Consulted and Agree with Plan of Care Patient;Family member/caregiver    Family Member Consulted Aunt           Patient will benefit from skilled therapeutic intervention in order to improve the following deficits and impairments:   Body Structure / Function / Physical Skills: ADL,Strength,Gait,Decreased knowledge of use of DME,Balance,Pain,UE functional  use,GMC,Dexterity,Endurance,IADL,ROM,Vision,Coordination,Flexibility,Mobility,Sensation,FMC,Decreased knowledge of precautions Cognitive Skills: Attention,Safety Awareness,Problem Solve,Understand,Sequencing     Visit Diagnosis: Muscle weakness (generalized)  Other abnormalities of gait and mobility  Hemiplegia and hemiparesis following other cerebrovascular disease affecting left non-dominant side (HCC)  Other lack of coordination  Unsteadiness on feet  Acute pain of left shoulder  Stiffness of left shoulder, not elsewhere classified  Attention and concentration deficit  Hemiplegia and hemiparesis following cerebral infarction affecting left non-dominant side [facility])    Problem List Patient Active Problem List   Diagnosis Date Noted  . Uncontrolled type 2 diabetes mellitus with hyperglycemia, without long-term current use of insulin (HCC) 04/16/2020  . CVA (cerebral vascular accident) (HCC) 04/15/2020  . Cocaine use 04/15/2020  . Acute CVA (cerebrovascular accident) (HCC) 03/04/2020  . Stroke (cerebrum) (HCC) 03/03/2020  . Weakness   . Noncompliance with medication regimen   . TIA (transient ischemic attack) 01/22/2019  . Diabetes mellitus due to underlying condition without complications (HCC) 05/30/2014  . Essential hypertension, benign 05/30/2014  . Smoking 05/30/2014    Junious Dresser MOT, OTR/L  09/24/2020, 2:56 PM  [facility] [address] [place], [place], [address] Phone: [phone]   Fax:  [phone]  Name: Ebony Matthews MRN: [id number] Date of Birth: [birth year]

## 2020-09-25 ENCOUNTER — Encounter: Payer: Self-pay | Admitting: Neurology

## 2020-09-25 ENCOUNTER — Ambulatory Visit (INDEPENDENT_AMBULATORY_CARE_PROVIDER_SITE_OTHER): Payer: Medicaid Other | Admitting: Neurology

## 2020-09-25 VITALS — BP 122/75 | HR 95 | Ht 67.0 in | Wt 194.6 lb

## 2020-09-25 DIAGNOSIS — I6349 Cerebral infarction due to embolism of other cerebral artery: Secondary | ICD-10-CM | POA: Diagnosis not present

## 2020-09-25 DIAGNOSIS — I69354 Hemiplegia and hemiparesis following cerebral infarction affecting left non-dominant side: Secondary | ICD-10-CM

## 2020-09-25 DIAGNOSIS — E1165 Type 2 diabetes mellitus with hyperglycemia: Secondary | ICD-10-CM | POA: Diagnosis not present

## 2020-09-25 DIAGNOSIS — I63231 Cerebral infarction due to unspecified occlusion or stenosis of right carotid arteries: Secondary | ICD-10-CM

## 2020-09-25 DIAGNOSIS — F17200 Nicotine dependence, unspecified, uncomplicated: Secondary | ICD-10-CM

## 2020-09-25 DIAGNOSIS — E785 Hyperlipidemia, unspecified: Secondary | ICD-10-CM

## 2020-09-25 DIAGNOSIS — I10 Essential (primary) hypertension: Secondary | ICD-10-CM

## 2020-09-25 MED ORDER — CLOPIDOGREL BISULFATE 75 MG PO TABS
75.0000 mg | ORAL_TABLET | Freq: Every day | ORAL | 0 refills | Status: DC
Start: 2020-09-25 — End: 2024-01-27

## 2020-09-25 NOTE — Patient Instructions (Signed)
1.  Refilled clopidogrel 75mg  daily.  Further refills should be prescribed by your PCP 2.  Continue atorvastatin 80mg , metformin and hydrochlorothiazide  3.  Will reorder 30 day cardiac event monitor. If you don't hear from anybody in a week, contact me 4.  Follow up 6 months.

## 2020-09-26 ENCOUNTER — Other Ambulatory Visit: Payer: Self-pay

## 2020-09-26 ENCOUNTER — Encounter: Payer: Self-pay | Admitting: Occupational Therapy

## 2020-09-26 ENCOUNTER — Ambulatory Visit: Payer: Medicaid Other | Admitting: Occupational Therapy

## 2020-09-26 ENCOUNTER — Ambulatory Visit: Payer: Medicaid Other | Admitting: Physical Therapy

## 2020-09-26 ENCOUNTER — Encounter: Payer: Self-pay | Admitting: Physical Therapy

## 2020-09-26 DIAGNOSIS — I69854 Hemiplegia and hemiparesis following other cerebrovascular disease affecting left non-dominant side: Secondary | ICD-10-CM

## 2020-09-26 DIAGNOSIS — R2681 Unsteadiness on feet: Secondary | ICD-10-CM

## 2020-09-26 DIAGNOSIS — R278 Other lack of coordination: Secondary | ICD-10-CM

## 2020-09-26 DIAGNOSIS — M6281 Muscle weakness (generalized): Secondary | ICD-10-CM | POA: Diagnosis not present

## 2020-09-26 DIAGNOSIS — R2689 Other abnormalities of gait and mobility: Secondary | ICD-10-CM

## 2020-09-26 NOTE — Therapy (Signed)
[facility] [address] [place], [place], [address] Phone: [phone]   Fax:  [phone]  Occupational Therapy Treatment  Patient Details  Name: Ebony Matthews MRN: [id number] Date of Birth: [birth year] Referring Provider (OT): Dr. Everlena Cooper   Encounter Date: 09/26/2020   OT End of Session - 09/26/20 1103    Visit Number 20    Number of Visits 25    Date for OT Re-Evaluation 10/05/20   extended renewal date d/t scheduling and not at end of POC   Authorization Type U[employer]    Authorization Time Period VL:MN - auth required after 25th visit (Week 11 of 12 09/24/20)    Authorization - Visit Number 20    Authorization - Number of Visits 25    OT Start Time 1102    OT Stop Time 1142    OT Time Calculation (min) 40 min    Equipment Utilized During Treatment gait belt    Activity Tolerance Patient tolerated treatment well    Behavior During Therapy [facility] for tasks assessed/performed;Flat affect           Past Medical History:  Diagnosis Date  . Diabetes mellitus without complication (HCC)   . Hypertension   . TIA (transient ischemic attack) 01/22/2019    Past Surgical History:  Procedure Laterality Date  . NO PAST SURGERIES      There were no vitals filed for this visit.   Subjective Assessment - 09/26/20 1104    Subjective  Pt denies any pain    Patient is accompanied by: Family member   aunt   Pertinent History PMH of CVA 2020, CVA June 2021 (R cerebellar) HTN, IIDM, HLD, on going cocaine/polysubstance abuse, non-compliant with medications and presented with new onset of left arm weakness and numbness.    Limitations Fall Risk. Not Driving. Left Inattention    Patient Stated Goals "to be able to move my arms" and "to be able to hold on and grip on stuff" with LUE    Currently in Pain? No/denies                        OT Treatments/Exercises (OP) - 09/26/20 1124      ADLs   Functional Mobility  weight shifting in the kitchen for reaching across midline with RUE and elevated with SBA x 10 while standing at kitchen counter      Visual/Perceptual Exercises   Copy this Image Pegboard    Pegboard followed peg board design with approximately 90% accuracy and verbal and visual cues for attending to errors      Neurological Re-education Exercises   Other Exercises 1 self PROM with seated reach to floor x 10    Weight-Shifting Exercises - Lateral worked on putting more weight into LLE while static standing t table to complete medium peg board design with RUE. placed 2" block under RLE for encouraging more weight into LLE. Lateral weight shifts in sitting x 10    Diagonal Patterns Sitting    Diagonal Patterns Sitting with weight bearing into LUE while seated and reaching down to left foot and up to ceiling with RUE x 10                    OT Short Term Goals - 09/17/20 1022      OT SHORT TERM GOAL #1   Title Pt will be independent with HEP 07/16/2020    Baseline HEP not issued  yet    Time 4    Period Weeks    Status Achieved    Target Date 07/16/20      OT SHORT TERM GOAL #2   Title Pt will perform UB dressing with min A and implementing hemitechniques and adapted strategies PRN in order to increase independence with ADLs    Baseline max A for UB dressing    Time 4    Period Weeks    Status On-going   mod  A 11/9     OT SHORT TERM GOAL #3   Title Pt will perform bathing with mod A and implementing adapted strategies PRN in order to increase independence with ADLs    Baseline total A currently for all bathing    Time 4    Period Weeks    Status Achieved   pt reports she is doing most of the wiping of her body     OT SHORT TERM GOAL #4   Title Patient will demonstrate safe positioning and handling for LUE during transfers with min verbal cues 4/5 trials in order to decrease pain and risk of injury.    Baseline currently severe left inattention to LUE    Time 4     Period Weeks    Status Achieved      OT SHORT TERM GOAL #5   Title Pt will complete table top scanning with 50 % accuracy in order to increase awareness to left hemisphere.    Baseline 29/121 or approx 24%    Time 4    Period Weeks    Status Achieved      OT SHORT TERM GOAL #6   Title Patient will prepare a light snack with mod A and adapted strategies PRN in order to increase independence with IADLs.    Baseline total A for snack prep    Time 4    Period Weeks    Status Deferred             OT Long Term Goals - 09/24/20 1117      OT LONG TERM GOAL #1   Title Pt will be independent with updated HEP 08/13/20    Baseline not implemented .    Time 12    Period Weeks    Status On-going      OT LONG TERM GOAL #2   Title Pt will perform UB dressing with supervision and implementing hemitechniques and adapted strategies PRN in order to increase independence with ADLs    Baseline max A for UB dressing    Time 12    Period Weeks    Status On-going   min A with front open face jacket     OT LONG TERM GOAL #3   Title Pt will perform bathing with min A and implementing adapted strategies PRN in order to increase independence with ADLs    Baseline total A for bathing at evaluation    Time 12    Period Weeks    Status On-going      OT LONG TERM GOAL #4   Title Patient will demonstrate safe positioning and handling for LUE during transfers Independently (with no verbal cues) 4/5 trials in order to decrease pain and risk of injury.    Baseline currently left inattention to LUE    Time 12    Period Weeks    Status Achieved      OT LONG TERM GOAL #5   Title Pt will complete table top scanning  with 75% accuracy in order to increase awareness to left hemisphere.    Baseline 24% accuracy -29/121 double digit cancellation    Time 12    Period Weeks    Status Achieved      OT LONG TERM GOAL #6   Title Patient will prepare a light snack with min A and adapted strategies PRN in  order to increase independence with IADLs.    Baseline not preparing snacks    Time 12    Period Weeks    Status Achieved      OT LONG TERM GOAL #7   Title Pt will utilize LUE as a stabilizer with no more than 2 verbal cues in order to increase independence and use of LUE.    Time 12    Period Weeks    Status On-going                 Plan - 09/26/20 1147    Clinical Impression Statement Pt is progressing towards goals and increasing independence with daily activities    OT Occupational Profile and History Detailed Assessment- Review of Records and additional review of physical, cognitive, psychosocial history related to current functional performance    Occupational performance deficits (Please refer to evaluation for details): IADL's;ADL's;Rest and Sleep;Education    Body Structure / Function / Physical Skills ADL;Strength;Gait;Decreased knowledge of use of DME;Balance;Pain;UE functional use;GMC;Dexterity;Endurance;IADL;ROM;Vision;Coordination;Flexibility;Mobility;Sensation;FMC;Decreased knowledge of precautions    Cognitive Skills Attention;Safety Awareness;Problem Solve;Understand;Sequencing    Rehab Potential Good    Clinical Decision Making Several treatment options, min-mod task modification necessary    Comorbidities Affecting Occupational Performance: May have comorbidities impacting occupational performance    Modification or Assistance to Complete Evaluation  Min-Moderate modification of tasks or assist with assess necessary to complete eval    OT Frequency 2x / week    OT Duration 12 weeks    OT Treatment/Interventions Self-care/ADL training;Moist Heat;DME and/or AE instruction;Splinting;Balance training;Fluidtherapy;Gait Training;Therapeutic activities;Therapeutic exercise;Ultrasound;Electrical Stimulation;Energy conservation;Manual Therapy;Patient/family education;Visual/perceptual remediation/compensation;Passive range of motion;Functional Mobility  Training;Neuromuscular education    Plan resting hand splint?, kitchen mobility, weight shifting in kitchen, discharge at end of scheduled sessions POC visits    Consulted and Agree with Plan of Care Patient;Family member/caregiver    Family Member Consulted Aunt           Patient will benefit from skilled therapeutic intervention in order to improve the following deficits and impairments:   Body Structure / Function / Physical Skills: ADL,Strength,Gait,Decreased knowledge of use of DME,Balance,Pain,UE functional use,GMC,Dexterity,Endurance,IADL,ROM,Vision,Coordination,Flexibility,Mobility,Sensation,FMC,Decreased knowledge of precautions Cognitive Skills: Attention,Safety Awareness,Problem Solve,Understand,Sequencing     Visit Diagnosis: Muscle weakness (generalized)  Other abnormalities of gait and mobility  Hemiplegia and hemiparesis following other cerebrovascular disease affecting left non-dominant side (HCC)  Other lack of coordination  Unsteadiness on feet    Problem List Patient Active Problem List   Diagnosis Date Noted  . Uncontrolled type 2 diabetes mellitus with hyperglycemia, without long-term current use of insulin (HCC) 04/16/2020  . CVA (cerebral vascular accident) (HCC) 04/15/2020  . Cocaine use 04/15/2020  . Acute CVA (cerebrovascular accident) (HCC) 03/04/2020  . Stroke (cerebrum) (HCC) 03/03/2020  . Weakness   . Noncompliance with medication regimen   . TIA (transient ischemic attack) 01/22/2019  . Diabetes mellitus due to underlying condition without complications (HCC) 05/30/2014  . Essential hypertension, benign 05/30/2014  . Smoking 05/30/2014    Junious Dresser MOT, OTR/L  09/26/2020, 11:48 AM  [facility] [address] [place], [place], [address]  Phone: [phone]   Fax:  [phone]  Name: Ebony Matthews MRN: [id number] Date of Birth: 09-24-1981

## 2020-09-27 NOTE — Therapy (Signed)
[facility] [address] [place], [place], [address] Phone: [phone]   Fax:  [phone]  Physical Therapy Treatment  Patient Details  Name: Ebony Matthews MRN: [id number] Date of Birth: July 30, 1981 Referring Provider (PT): Referred by: Georgiann Cocker, MD. Followed by Shon Millet, MD   Encounter Date: 09/26/2020   PT End of Session - 09/26/20 1024    Visit Number 35    Date for PT Re-Evaluation 10/15/20    Authorization Type UHC    PT Start Time 1017    PT Stop Time 1100    PT Time Calculation (min) 43 min    Equipment Utilized During Treatment Gait belt;Other (comment)   pt's custom AFO   Activity Tolerance Patient tolerated treatment well    Behavior During Therapy WFL for tasks assessed/performed           Past Medical History:  Diagnosis Date  . Diabetes mellitus without complication (HCC)   . Hypertension   . TIA (transient ischemic attack) 01/22/2019    Past Surgical History:  Procedure Laterality Date  . NO PAST SURGERIES      There were no vitals filed for this visit.     09/26/20 1023  Symptoms/Limitations  Subjective No new  complaints. HEP is going well at home.  Patient is accompained by: Family member  Pertinent History hx of multiple CVA, DM, HTN, drug abuse  Limitations Standing;Walking;House hold activities;Lifting  How long can you sit comfortably? no issues  How long can you stand comfortably? <30 sec  How long can you walk comfortably? <10 feet with quad cane  Patient Stated Goals Get my balance better.  Pain Assessment  Currently in Pain? No/denies       [facility] Adult PT Treatment/Exercise - 09/26/20 1026      Transfers   Transfers Sit to Stand;Stand to Sit    Sit to Stand 5: Supervision    Stand to Sit 5: Supervision      Ambulation/Gait   Ambulation/Gait Yes    Ambulation/Gait Assistance 4: Min guard    Ambulation/Gait Assistance Details around gym with session.  cues on step length and weight shifting onto left LE in stance.    Assistive device Hemi-walker    Gait Pattern Decreased arm swing - left;Decreased arm swing - right;Decreased hip/knee flexion - left;Decreased dorsiflexion - left;Decreased weight shift to left;Decreased stance time - left;Wide base of support;Step-through pattern    Ambulation Surface Level;Indoor      High Level Balance   High Level Balance Activities Side stepping;Marching forwards;Marching backwards    High Level Balance Comments on blue mat in parallel bars for 3 laps toward each direction wtih cues for step length, step placement, posture and weight shifting. min assist needed for balance with single UE support and left LE placement with stepping at times.      Knee/Hip Exercises: Aerobic   Other Aerobic Scifit level 3.0 with LE's only working on full range of motion with assist to keep left LE in place for 5 minutes.      Knee/Hip Exercises: Machines for Strengthening   Cybex Leg Press bil LE"s at 80#'s for 10 reps with assist to control essentric return. then left LE only 3#'s for 10 reps wtih assist needed for controlled movements both ways.               Balance Exercises - 09/26/20 1806      Balance Exercises: Standing   Rockerboard Anterior/posterior;Lateral;EO;EC;Head turns;30 seconds;Other reps (  comment);Intermittent UE support;Limitations    Rockerboard Limitations performed both ways on balance board: EC 30 sec's x 3 reps, then EO for head movements left<>right, up<>down for ~10 reps each. min guard to min assist for balance with cues on posture and weight shifting to assist with balance.               PT Short Term Goals - 09/17/20 1739      PT SHORT TERM GOAL #1   Title Patient and caregiver will report walking daily in home for improved household mobility (ALL STGs Due: 07/27/2020)    Baseline continues to report not walking daily, currently walking approx 2-3x/week    Time 3    Period  Weeks    Status Achieved    Target Date 07/27/20      PT SHORT TERM GOAL #2   Title Patient will improve TUG to <1 min to demonstrate improved functional mobility    Baseline 1 min, 32 secs; 10/28 1 min, 17 secs; 09/17/20  53 sec    Time 3    Period Weeks    Status Achieved      PT SHORT TERM GOAL #3   Title Patient will improve gait speed to > 1.0 ft sec to demonstrate improved household mobility    Baseline deferred as unable to walk without assistance    Time 3    Period Weeks    Status On-going             PT Long Term Goals - 09/17/20 1740      PT LONG TERM GOAL #1   Title Patient will report independence with progressive HEP with caregiver asssistance (ALL LTG's Due: 10/15/20)    Baseline continue to progress HEP    Time 4    Period Weeks    Status On-going    Target Date 10/15/20      PT LONG TERM GOAL #2   Title Patient will demo ability to ambulate >250 ft with LRAD and CGA to demo improved functional mobility and ambulation within the home    Baseline 14 ft, Min A; 207' with CGA and hemi walker; 345' SBA with hemiwalker + L AFO    Time 6    Period Weeks    Status Achieved      PT LONG TERM GOAL #3   Title Patient will demo ability to complete TUG  < 50 seconds to demo improved ambulation and mobility    Baseline 1 min 32 secs    Time 4    Period Weeks    Status On-going    Target Date 10/15/20      PT LONG TERM GOAL #4   Title Patient will improve gait speed to >/= 1.5 ft/sec to demonstrate improved mobility    Baseline 0.8 ft/sec on 09/17/20    Time 4    Period Weeks    Status On-going    Target Date 10/15/20                 Plan - 09/26/20 1024    Clinical Impression Statement Today's skilled session continued to focus on strengthening and balance training with emphasis on left LE weight bearing/shifitng. Rest breaks taken as needed with session. The pt is progressing toward goals and should benefit from continued PT to progress toward  unmet goals.    Personal Factors and Comorbidities Comorbidity 3+;Past/Current Experience;Time since onset of injury/illness/exacerbation;Transportation;Behavior Pattern    Comorbidities DM, HTN, hx of multiple  strokes in last 6 months    Examination-Activity Limitations Bathing;Bed Mobility;Caring for Others;Carry;Dressing;Hygiene/Grooming;Lift;Reach Overhead;Squat    Examination-Participation Restrictions Cleaning;Community Activity;Driving;Laundry;Yard Work;Occupation;Church;Meal Prep    Stability/Clinical Decision Making Evolving/Moderate complexity    Rehab Potential Good    PT Frequency 2x / week    PT Duration 6 weeks    PT Treatment/Interventions ADLs/Self Care Home Management;Gait training;Stair training;Functional mobility training;Therapeutic activities;Therapeutic exercise;Balance training;Neuromuscular re-education;Manual techniques;Orthotic Fit/Training;Patient/family education;Cognitive remediation;Passive range of motion;Energy conservation;DME Instruction;Electrical Stimulation;Joint Manipulations    PT Next Visit Plan Continue activites to promote quad actvitiation. Gait training with pt's personal AFO. continue with balance activities on compliant surfaces and L side weight shift.    PT Home Exercise Plan pt to work on standing at counter with L weightshift and work on keeping feet forward while ambulating with hemiwalker    Consulted and Agree with Plan of Care Patient;Family member/caregiver    Family Member Consulted Aunt           Patient will benefit from skilled therapeutic intervention in order to improve the following deficits and impairments:  Abnormal gait,Decreased activity tolerance,Decreased balance,Decreased cognition,Decreased coordination,Decreased endurance,Decreased knowledge of precautions,Decreased mobility,Decreased range of motion,Decreased safety awareness,Decreased strength,Difficulty walking,Impaired perceived functional ability,Impaired  flexibility,Impaired sensation,Impaired tone,Impaired UE functional use,Postural dysfunction,Pain  Visit Diagnosis: Muscle weakness (generalized)  Other abnormalities of gait and mobility  Hemiplegia and hemiparesis following other cerebrovascular disease affecting left non-dominant side (HCC)  Unsteadiness on feet     Problem List Patient Active Problem List   Diagnosis Date Noted  . Uncontrolled type 2 diabetes mellitus with hyperglycemia, without long-term current use of insulin (HCC) 04/16/2020  . CVA (cerebral vascular accident) (HCC) 04/15/2020  . Cocaine use 04/15/2020  . Acute CVA (cerebrovascular accident) (HCC) 03/04/2020  . Stroke (cerebrum) (HCC) 03/03/2020  . Weakness   . Noncompliance with medication regimen   . TIA (transient ischemic attack) 01/22/2019  . Diabetes mellitus due to underlying condition without complications (HCC) 05/30/2014  . Essential hypertension, benign 05/30/2014  . Smoking 05/30/2014   Sallyanne Kuster, PTA, [facility] [address] [place], [place] [address] [phone] 09/27/20, 6:24 PM   Name: Bettie Capistran MRN: [id number] Date of Birth: Feb 04, 1981

## 2020-10-03 ENCOUNTER — Encounter: Payer: Self-pay | Admitting: Physical Therapy

## 2020-10-03 ENCOUNTER — Encounter: Payer: Self-pay | Admitting: Occupational Therapy

## 2020-10-03 ENCOUNTER — Ambulatory Visit: Payer: Medicaid Other | Admitting: Occupational Therapy

## 2020-10-03 ENCOUNTER — Other Ambulatory Visit: Payer: Self-pay

## 2020-10-03 ENCOUNTER — Ambulatory Visit: Payer: Medicaid Other | Attending: Internal Medicine | Admitting: Physical Therapy

## 2020-10-03 DIAGNOSIS — R2689 Other abnormalities of gait and mobility: Secondary | ICD-10-CM | POA: Diagnosis present

## 2020-10-03 DIAGNOSIS — M6281 Muscle weakness (generalized): Secondary | ICD-10-CM

## 2020-10-03 DIAGNOSIS — M25512 Pain in left shoulder: Secondary | ICD-10-CM | POA: Insufficient documentation

## 2020-10-03 DIAGNOSIS — I69354 Hemiplegia and hemiparesis following cerebral infarction affecting left non-dominant side: Secondary | ICD-10-CM | POA: Insufficient documentation

## 2020-10-03 DIAGNOSIS — R278 Other lack of coordination: Secondary | ICD-10-CM | POA: Insufficient documentation

## 2020-10-03 DIAGNOSIS — R2681 Unsteadiness on feet: Secondary | ICD-10-CM | POA: Insufficient documentation

## 2020-10-03 DIAGNOSIS — I69854 Hemiplegia and hemiparesis following other cerebrovascular disease affecting left non-dominant side: Secondary | ICD-10-CM

## 2020-10-03 DIAGNOSIS — R41840 Attention and concentration deficit: Secondary | ICD-10-CM

## 2020-10-03 DIAGNOSIS — M25612 Stiffness of left shoulder, not elsewhere classified: Secondary | ICD-10-CM

## 2020-10-03 NOTE — Therapy (Signed)
[facility] [address] [place], [place], [address] Phone: [phone]   Fax:  [phone]  Occupational Therapy Treatment  Patient Details  Name: Ebony Matthews MRN: [id number] Date of Birth: 1981/03/02 Referring Provider (OT): Dr. Tomi Likens   Encounter Date: 10/03/2020   OT End of Session - 10/03/20 1238    Visit Number 21    Number of Visits 25    Date for OT Re-Evaluation 10/05/20   extended renewal date d/t scheduling and not at end of POC   Authorization Type U[employer]    Authorization Time Period VL:MN - auth required after 25th visit (Week 12 of 12 10/03/20)    Authorization - Visit Number 21    Authorization - Number of Visits 25    OT Start Time 1233    OT Stop Time 1315    OT Time Calculation (min) 42 min    Equipment Utilized During Treatment gait belt    Activity Tolerance Patient tolerated treatment well    Behavior During Therapy [facility] for tasks assessed/performed;Flat affect           Past Medical History:  Diagnosis Date  . Diabetes mellitus without complication ([place])   . Hypertension   . TIA (transient ischemic attack) 01/22/2019    Past Surgical History:  Procedure Laterality Date  . NO PAST SURGERIES      There were no vitals filed for this visit.   Subjective Assessment - 10/03/20 1241    Subjective  Pt denies any pain. Pt reports no changes.    Patient is accompanied by: Family member   aunt   Pertinent History PMH of CVA 2020, CVA June 2021 (R cerebellar) HTN, IIDM, HLD, on going cocaine/polysubstance abuse, non-compliant with medications and presented with new onset of left arm weakness and numbness.    Limitations Fall Risk. Not Driving. Left Inattention    Patient Stated Goals "to be able to move my arms" and "to be able to hold on and grip on stuff" with LUE    Currently in Pain? No/denies                        OT Treatments/Exercises (OP) - 10/03/20 1259       Transfers   Sit to Stand 6: Modified independent (Device/Increase time);4: Min guard    Sit to Stand Details (indicate cue type and reason) pt sit to stand with hemi walker with mod I. Pt with standing unassisted and without pushing up with min guard      ADLs   Toileting pt continues to complete toileting with mod I at home with no reports of difficulties or issues    Cooking pt reports going to the kitchen and preparing a drink for herself. Pt reports difficulty with carrying cup back to couch but used mouth with styrofoam cup to carry back.      Neurological Re-education Exercises   Weight-Shifting Exercises - Lateral weight shifing in standing with reaching across midline to obtain and place resistance clothespins with 1-8# with RUE                  OT Education - 10/03/20 1317    Education Details discussed discharge at end of week (1/7)    Person(s) Educated Patient;Other (comment)   aunt   Methods Explanation    Comprehension Verbalized understanding            OT Short Term Goals - 10/03/20 1239  OT SHORT TERM GOAL #1   Title Pt will be independent with HEP 07/16/2020    Baseline HEP not issued yet    Time 4    Period Weeks    Status Achieved    Target Date 07/16/20      OT SHORT TERM GOAL #2   Title Pt will perform UB dressing with min A and implementing hemitechniques and adapted strategies PRN in order to increase independence with ADLs    Baseline max A for UB dressing    Time 4    Period Weeks    Status Not Met   mod  A 1/5     OT SHORT TERM GOAL #3   Title Pt will perform bathing with mod A and implementing adapted strategies PRN in order to increase independence with ADLs    Baseline total A currently for all bathing    Time 4    Period Weeks    Status Achieved   pt reports she is doing most of the wiping of her body     OT SHORT TERM GOAL #4   Title Patient will demonstrate safe positioning and handling for LUE during transfers with min  verbal cues 4/5 trials in order to decrease pain and risk of injury.    Baseline currently severe left inattention to LUE    Time 4    Period Weeks    Status Achieved      OT SHORT TERM GOAL #5   Title Pt will complete table top scanning with 50 % accuracy in order to increase awareness to left hemisphere.    Baseline 29/121 or approx 24%    Time 4    Period Weeks    Status Achieved      OT SHORT TERM GOAL #6   Title Patient will prepare a light snack with mod A and adapted strategies PRN in order to increase independence with IADLs.    Baseline total A for snack prep    Time 4    Period Weeks    Status Deferred             OT Long Term Goals - 10/03/20 1239      OT LONG TERM GOAL #1   Title Pt will be independent with updated HEP 08/13/20    Baseline not implemented .    Time 12    Period Weeks    Status Achieved      OT LONG TERM GOAL #2   Title Pt will perform UB dressing with supervision and implementing hemitechniques and adapted strategies PRN in order to increase independence with ADLs    Baseline max A for UB dressing    Time 12    Period Weeks    Status Deferred   min A with front open face jacket     OT LONG TERM GOAL #3   Title Pt will perform bathing with min A and implementing adapted strategies PRN in order to increase independence with ADLs    Baseline total A for bathing at evaluation    Time 12    Period Weeks    Status Not Met   mod A 1/5     OT LONG TERM GOAL #4   Title Patient will demonstrate safe positioning and handling for LUE during transfers Independently (with no verbal cues) 4/5 trials in order to decrease pain and risk of injury.    Baseline currently left inattention to LUE    Time 12  Period Weeks    Status Achieved      OT LONG TERM GOAL #5   Title Pt will complete table top scanning with 75% accuracy in order to increase awareness to left hemisphere.    Baseline 24% accuracy -29/121 double digit cancellation    Time 12     Period Weeks    Status Achieved      OT LONG TERM GOAL #6   Title Patient will prepare a light snack with min A and adapted strategies PRN in order to increase independence with IADLs.    Baseline not preparing snacks    Time 12    Period Weeks    Status Achieved      OT LONG TERM GOAL #7   Title Pt will utilize LUE as a stabilizer with no more than 2 verbal cues in order to increase independence and use of LUE.    Time 12    Period Weeks    Status On-going                 Plan - 10/03/20 1315    Clinical Impression Statement Pt is progressing towards goals. Pt has increased independence and participation in self-care and ADLs/IADLs. Pt continues to present with limited to no activation in LUE.    OT Occupational Profile and History Detailed Assessment- Review of Records and additional review of physical, cognitive, psychosocial history related to current functional performance    Occupational performance deficits (Please refer to evaluation for details): IADL's;ADL's;Rest and Sleep;Education    Body Structure / Function / Physical Skills ADL;Strength;Gait;Decreased knowledge of use of DME;Balance;Pain;UE functional use;GMC;Dexterity;Endurance;IADL;ROM;Vision;Coordination;Flexibility;Mobility;Sensation;FMC;Decreased knowledge of precautions    Cognitive Skills Attention;Safety Awareness;Problem Solve;Understand;Sequencing    Rehab Potential Good    Clinical Decision Making Several treatment options, min-mod task modification necessary    Comorbidities Affecting Occupational Performance: May have comorbidities impacting occupational performance    Modification or Assistance to Complete Evaluation  Min-Moderate modification of tasks or assist with assess necessary to complete eval    OT Frequency 2x / week    OT Duration 12 weeks    OT Treatment/Interventions Self-care/ADL training;Moist Heat;DME and/or AE instruction;Splinting;Balance training;Fluidtherapy;Gait  Training;Therapeutic activities;Therapeutic exercise;Ultrasound;Electrical Stimulation;Energy conservation;Manual Therapy;Patient/family education;Visual/perceptual remediation/compensation;Passive range of motion;Functional Mobility Training;Neuromuscular education    Plan plan to discharge next session.    Consulted and Agree with Plan of Care Patient;Family member/caregiver    Family Member Consulted Aunt           Patient will benefit from skilled therapeutic intervention in order to improve the following deficits and impairments:   Body Structure / Function / Physical Skills: ADL,Strength,Gait,Decreased knowledge of use of DME,Balance,Pain,UE functional use,GMC,Dexterity,Endurance,IADL,ROM,Vision,Coordination,Flexibility,Mobility,Sensation,FMC,Decreased knowledge of precautions Cognitive Skills: Attention,Safety Awareness,Problem Solve,Understand,Sequencing     Visit Diagnosis: Muscle weakness (generalized)  Other abnormalities of gait and mobility  Hemiplegia and hemiparesis following other cerebrovascular disease affecting left non-dominant side (HCC)  Other lack of coordination  Unsteadiness on feet  Acute pain of left shoulder  Stiffness of left shoulder, not elsewhere classified  Attention and concentration deficit  Hemiplegia and hemiparesis following cerebral infarction affecting left non-dominant side [facility])    Problem List Patient Active Problem List   Diagnosis Date Noted  . Uncontrolled type 2 diabetes mellitus with hyperglycemia, without long-term current use of insulin ([place]) 04/16/2020  . CVA (cerebral vascular accident) ([place]) 04/15/2020  . Cocaine use 04/15/2020  . Acute CVA (cerebrovascular accident) ([place]) 03/04/2020  . Stroke (cerebrum) ([place]) 03/03/2020  . Weakness   . Noncompliance  with medication regimen   . TIA (transient ischemic attack) 01/22/2019  . Diabetes mellitus due to underlying condition without complications ([place]) 67/20/9470  .  Essential hypertension, benign 05/30/2014  . Smoking 05/30/2014    Zachery Conch MOT, OTR/L  10/03/2020, 1:22 PM  [facility] [address] [place], [place], [address] Phone: [phone]   Fax:  [phone]  Name: Ebony Matthews MRN: [id number] Date of Birth: 10-May-1981

## 2020-10-03 NOTE — Therapy (Signed)
[facility] [address] [place], [place], [address] Phone: [phone]   Fax:  [phone]  Physical Therapy Treatment  Patient Details  Name: Danetta Prom MRN: [id number] Date of Birth: 1981-04-11 Referring Provider (PT): Referred by: Jordan Hawks, MD. Followed by Metta Clines, MD   Encounter Date: 10/03/2020   PT End of Session - 10/03/20 1320    Visit Number 36    Date for PT Re-Evaluation 10/15/20    Authorization Type UHC    PT Start Time 4008    PT Stop Time 1400    PT Time Calculation (min) 43 min    Equipment Utilized During Treatment Gait belt;Other (comment)   pt's custom AFO   Activity Tolerance Patient tolerated treatment well    Behavior During Therapy WFL for tasks assessed/performed           Past Medical History:  Diagnosis Date  . Diabetes mellitus without complication ([place])   . Hypertension   . TIA (transient ischemic attack) 01/22/2019    Past Surgical History:  Procedure Laterality Date  . NO PAST SURGERIES      There were no vitals filed for this visit.   Subjective Assessment - 10/03/20 1320    Subjective No new  complaints. HEP is going well at home. No falls or pain to report.    Patient is accompained by: Family member   aunt   Pertinent History hx of multiple CVA, DM, HTN, drug abuse    Limitations Standing;Walking;House hold activities;Lifting    How long can you sit comfortably? no issues    How long can you stand comfortably? <30 sec    How long can you walk comfortably? <10 feet with quad cane    Patient Stated Goals Get my balance better.    Pain Score 0-No pain                [facility] Adult PT Treatment/Exercise - 10/03/20 1321      Transfers   Transfers Sit to Stand;Stand to Sit    Sit to Stand 6: Modified independent (Device/Increase time);4: Min guard;With upper extremity assist;From bed;From chair/3-in-1    Sit to Stand Details (indicate cue type and  reason) reminder cues needed at times to ue hand on mat/chair to stand, then reach for the AD.    Stand to Sit 5: Supervision      Ambulation/Gait   Ambulation/Gait Yes    Ambulation/Gait Assistance 5: Supervision;4: Min guard    Ambulation/Gait Assistance Details around/out of session with hemi-walker with supervision. trial of lesser devices as follows: short distance with small base quad cane with min guard assist, then short distance with straight cane with rubber quad tip with min guard assist. Pt preferred the straight cane over the quad cane. then performed longer distance gait with straight cane around track with min guard to min assist    Ambulation Distance (Feet) 15 Feet   x2, 115 x1   Assistive device Hemi-walker;Small based quad cane;Straight cane   straight cane with rubber quad tip   Gait Pattern Decreased arm swing - left;Decreased arm swing - right;Decreased hip/knee flexion - left;Decreased dorsiflexion - left;Decreased weight shift to left;Decreased stance time - left;Wide base of support;Step-through pattern    Ambulation Surface Level;Indoor    Pre-Gait Activities in parallel bars with no UE support for 2 laps, min guard assist with wide base of support, decreased stance on left LE with short right step length.  Neuro Re-ed    Neuro Re-ed Details  for balance/muscle re-ed: standing at bottom of steps with single (right) UE support- left stance for right foot taps up/down bottom 3 stairs for 10 reps with cues/faciliatation to stay on left LE. then had pt work on left foot taps up/down bottom 2 steps with emphasis on hip/knee flexion vs circumduction with assitance needed.      Knee/Hip Exercises: Aerobic   Other Aerobic Scifit level 3.0 with LE's only working on full range of motion with assist to keep left LE in place for 5 minutes.                    PT Short Term Goals - 09/17/20 1739      PT SHORT TERM GOAL #1   Title Patient and caregiver will report  walking daily in home for improved household mobility (ALL STGs Due: 07/27/2020)    Baseline continues to report not walking daily, currently walking approx 2-3x/week    Time 3    Period Weeks    Status Achieved    Target Date 07/27/20      PT SHORT TERM GOAL #2   Title Patient will improve TUG to <1 min to demonstrate improved functional mobility    Baseline 1 min, 32 secs; 10/28 1 min, 17 secs; 09/17/20  53 sec    Time 3    Period Weeks    Status Achieved      PT SHORT TERM GOAL #3   Title Patient will improve gait speed to > 1.0 ft sec to demonstrate improved household mobility    Baseline deferred as unable to walk without assistance    Time 3    Period Weeks    Status On-going             PT Long Term Goals - 09/17/20 1740      PT LONG TERM GOAL #1   Title Patient will report independence with progressive HEP with caregiver asssistance (ALL LTG's Due: 10/15/20)    Baseline continue to progress HEP    Time 4    Period Weeks    Status On-going    Target Date 10/15/20      PT LONG TERM GOAL #2   Title Patient will demo ability to ambulate >250 ft with LRAD and CGA to demo improved functional mobility and ambulation within the home    Baseline 14 ft, Min A; 207' with CGA and hemi walker; 345' SBA with hemiwalker + L AFO    Time 6    Period Weeks    Status Achieved      PT LONG TERM GOAL #3   Title Patient will demo ability to complete TUG  < 50 seconds to demo improved ambulation and mobility    Baseline 1 min 32 secs    Time 4    Period Weeks    Status On-going    Target Date 10/15/20      PT LONG TERM GOAL #4   Title Patient will improve gait speed to >/= 1.5 ft/sec to demonstrate improved mobility    Baseline 0.8 ft/sec on 09/17/20    Time 4    Period Weeks    Status On-going    Target Date 10/15/20                 Plan - 10/03/20 1321    Clinical Impression Statement Today's skilled session continued to focus on strengthening and emphasis on  left LE stance/weight bearing. Also began gait training with less restrictive devices with pt perfering the straight cane with rubber quad tip over the small based quad cane. No issues noted or reported in session today. The pt is progressing toward goals and should benefit from continued PT to progress toward unmet goals.    Personal Factors and Comorbidities Comorbidity 3+;Past/Current Experience;Time since onset of injury/illness/exacerbation;Transportation;Behavior Pattern    Comorbidities DM, HTN, hx of multiple strokes in last 6 months    Examination-Activity Limitations Bathing;Bed Mobility;Caring for Others;Carry;Dressing;Hygiene/Grooming;Lift;Reach Overhead;Squat    Examination-Participation Restrictions Cleaning;Community Activity;Driving;Laundry;Yard Work;Occupation;Church;Meal Prep    Stability/Clinical Decision Making Evolving/Moderate complexity    Rehab Potential Good    PT Frequency 2x / week    PT Duration 6 weeks    PT Treatment/Interventions ADLs/Self Care Home Management;Gait training;Stair training;Functional mobility training;Therapeutic activities;Therapeutic exercise;Balance training;Neuromuscular re-education;Manual techniques;Orthotic Fit/Training;Patient/family education;Cognitive remediation;Passive range of motion;Energy conservation;DME Instruction;Electrical Stimulation;Joint Manipulations    PT Next Visit Plan Continue activites to promote quad actvitiation. Gait training with pt's personal AFO. continue with balance activities on compliant surfaces and L side weight shift. continue to work with the straight cane with rubber quad tip. Goals due 10/15/20.    PT Home Exercise Plan pt to work on standing at counter with L weightshift and work on keeping feet forward while ambulating with hemiwalker    Consulted and Agree with Plan of Care Patient;Family member/caregiver    Family Member Consulted Aunt           Patient will benefit from skilled therapeutic intervention  in order to improve the following deficits and impairments:  Abnormal gait,Decreased activity tolerance,Decreased balance,Decreased cognition,Decreased coordination,Decreased endurance,Decreased knowledge of precautions,Decreased mobility,Decreased range of motion,Decreased safety awareness,Decreased strength,Difficulty walking,Impaired perceived functional ability,Impaired flexibility,Impaired sensation,Impaired tone,Impaired UE functional use,Postural dysfunction,Pain  Visit Diagnosis: Muscle weakness (generalized)  Other abnormalities of gait and mobility  Hemiplegia and hemiparesis following other cerebrovascular disease affecting left non-dominant side [facility])     Problem List Patient Active Problem List   Diagnosis Date Noted  . Uncontrolled type 2 diabetes mellitus with hyperglycemia, without long-term current use of insulin (HCC) 04/16/2020  . CVA (cerebral vascular accident) ([facility]CC) 04/15/2020  . Cocaine use 04/15/2020  . Acute CVA (cerebrovascular accident) (HCC) 03/04/2020  . Stroke (cerebrum) (HCC) 03/03/2020  . Weakness   . Noncompliance with medication regimen   . TIA (transient ischemic attack) 01/22/2019  . Diabetes mellitus due to underlying condition without complications (HCC) 05/30/2014  . Essential hypertension, benign 05/30/2014  . Smoking 05/30/2014    Sallyanne Kuster, PTA, [facility] [address], [address] [place], [place] [address] [phone] 10/03/20, 6:01 PM   Name: Cary Wilford MRN: [id number] Date of Birth: 26-Jul-1981

## 2020-10-05 ENCOUNTER — Encounter: Payer: Self-pay | Admitting: Occupational Therapy

## 2020-10-05 ENCOUNTER — Ambulatory Visit: Payer: Medicaid Other | Admitting: Occupational Therapy

## 2020-10-05 ENCOUNTER — Ambulatory Visit: Payer: Medicaid Other

## 2020-10-05 ENCOUNTER — Other Ambulatory Visit: Payer: Self-pay

## 2020-10-05 DIAGNOSIS — R278 Other lack of coordination: Secondary | ICD-10-CM

## 2020-10-05 DIAGNOSIS — R2689 Other abnormalities of gait and mobility: Secondary | ICD-10-CM

## 2020-10-05 DIAGNOSIS — I69354 Hemiplegia and hemiparesis following cerebral infarction affecting left non-dominant side: Secondary | ICD-10-CM

## 2020-10-05 DIAGNOSIS — M6281 Muscle weakness (generalized): Secondary | ICD-10-CM

## 2020-10-05 DIAGNOSIS — M25612 Stiffness of left shoulder, not elsewhere classified: Secondary | ICD-10-CM

## 2020-10-05 DIAGNOSIS — R2681 Unsteadiness on feet: Secondary | ICD-10-CM

## 2020-10-05 DIAGNOSIS — R41840 Attention and concentration deficit: Secondary | ICD-10-CM

## 2020-10-05 NOTE — Therapy (Signed)
[facility] [address] [place], [place], [address] Phone: [phone]   Fax:  [phone]  Physical Therapy Treatment  Patient Details  Name: Ebony Matthews MRN: [id number] Date of Birth: 08-21-1981 Referring Provider (PT): Referred by: Georgiann Cocker, MD. Followed by Shon Millet, MD   Encounter Date: 10/05/2020   PT End of Session - 10/05/20 1146    Visit Number 37    Date for PT Re-Evaluation 10/15/20    Authorization Type UHC    PT Start Time 1145    PT Stop Time 1229    PT Time Calculation (min) 44 min    Equipment Utilized During Treatment Gait belt;Other (comment)   pt's custom AFO   Activity Tolerance Patient tolerated treatment well    Behavior During Therapy WFL for tasks assessed/performed           Past Medical History:  Diagnosis Date  . Diabetes mellitus without complication (HCC)   . Hypertension   . TIA (transient ischemic attack) 01/22/2019    Past Surgical History:  Procedure Laterality Date  . NO PAST SURGERIES      There were no vitals filed for this visit.   Subjective Assessment - 10/05/20 1150    Subjective Nothing new, patient reports that she is going to bathroom by herself. No falls.    Patient is accompained by: Family member   aunt   Pertinent History hx of multiple CVA, DM, HTN, drug abuse    Limitations Standing;Walking;House hold activities;Lifting    How long can you sit comfortably? no issues    How long can you stand comfortably? <30 sec    How long can you walk comfortably? <10 feet with quad cane    Patient Stated Goals Get my balance better.    Currently in Pain? No/denies                [facility] Adult PT Treatment/Exercise - 10/05/20 0001      Transfers   Transfers Sit to Stand;Stand to Sit    Sit to Stand 5: Supervision    Stand to Sit 5: Supervision    Comments continued sit <> stand training for low surface working toward improved alignment and improved  weight shift onto LLE x 10 reps.      Ambulation/Gait   Ambulation/Gait Yes    Ambulation/Gait Assistance 5: Supervision;4: Min guard    Ambulation/Gait Assistance Details Completed ambulation into/out of therapy sesion with hemiwalker. Compelted gait training x 230 ft with SPC with quad tip attachment. PT providing CGA with ambulation with SPC, PT providing verbal/tactiles cues for improved alignment. Completed gait training in // bars without UE support 10 ft x 6 reps, increased verbal cues for improved step length on RLE to promote weight shift to LLE.    Ambulation Distance (Feet) 230 Feet   x1 , 10 x 6   Assistive device Straight cane   with quad tip attachment   Gait Pattern Decreased arm swing - left;Decreased arm swing - right;Decreased hip/knee flexion - left;Decreased dorsiflexion - left;Decreased weight shift to left;Decreased stance time - left;Wide base of support;Step-through pattern    Ambulation Surface Level;Indoor      Neuro Re-ed    Neuro Re-ed Details  standing in // bars on airex pad: completed standing without UE support and wide BOS completing eyes closed 3 x 30 seconds. progressed to narrow BOS with eyes open standing steady 3 x 1 minutes, then progressed to horizontal/vertical head turns. Completed  standing with wide BOS and completed reaching for target to various directions x 15 reps. On firm surface with staggered stance completed standing with LLE posterior, completed vertical head turns and completed anterior/posterior weight shift x 10 reps.                    PT Short Term Goals - 09/17/20 1739      PT SHORT TERM GOAL #1   Title Patient and caregiver will report walking daily in home for improved household mobility (ALL STGs Due: 07/27/2020)    Baseline continues to report not walking daily, currently walking approx 2-3x/week    Time 3    Period Weeks    Status Achieved    Target Date 07/27/20      PT SHORT TERM GOAL #2   Title Patient will improve  TUG to <1 min to demonstrate improved functional mobility    Baseline 1 min, 32 secs; 10/28 1 min, 17 secs; 09/17/20  53 sec    Time 3    Period Weeks    Status Achieved      PT SHORT TERM GOAL #3   Title Patient will improve gait speed to > 1.0 ft sec to demonstrate improved household mobility    Baseline deferred as unable to walk without assistance    Time 3    Period Weeks    Status On-going             PT Long Term Goals - 09/17/20 1740      PT LONG TERM GOAL #1   Title Patient will report independence with progressive HEP with caregiver asssistance (ALL LTG's Due: 10/15/20)    Baseline continue to progress HEP    Time 4    Period Weeks    Status On-going    Target Date 10/15/20      PT LONG TERM GOAL #2   Title Patient will demo ability to ambulate >250 ft with LRAD and CGA to demo improved functional mobility and ambulation within the home    Baseline 14 ft, Min A; 207' with CGA and hemi walker; 345' SBA with hemiwalker + L AFO    Time 6    Period Weeks    Status Achieved      PT LONG TERM GOAL #3   Title Patient will demo ability to complete TUG  < 50 seconds to demo improved ambulation and mobility    Baseline 1 min 32 secs    Time 4    Period Weeks    Status On-going    Target Date 10/15/20      PT LONG TERM GOAL #4   Title Patient will improve gait speed to >/= 1.5 ft/sec to demonstrate improved mobility    Baseline 0.8 ft/sec on 09/17/20    Time 4    Period Weeks    Status On-going    Target Date 10/15/20                 Plan - 10/05/20 1251    Clinical Impression Statement Continued gait training with LRAD inlcuding SPC with quad tip, with patient ambulating x 230 ft with AD and througohut session with AD. Intermittent CGA required. Continued balance activities today working on improved anterior/posterior weight and balance with narrow BOS. Will continue to progress toward all LTGs.    Personal Factors and Comorbidities Comorbidity  3+;Past/Current Experience;Time since onset of injury/illness/exacerbation;Transportation;Behavior Pattern    Comorbidities DM, HTN, hx of multiple strokes  in last 6 months    Examination-Activity Limitations Bathing;Bed Mobility;Caring for Others;Carry;Dressing;Hygiene/Grooming;Lift;Reach Overhead;Squat    Examination-Participation Restrictions Cleaning;Community Activity;Driving;Laundry;Yard Work;Occupation;Church;Meal Prep    Stability/Clinical Decision Making Evolving/Moderate complexity    Rehab Potential Good    PT Frequency 2x / week    PT Duration 6 weeks    PT Treatment/Interventions ADLs/Self Care Home Management;Gait training;Stair training;Functional mobility training;Therapeutic activities;Therapeutic exercise;Balance training;Neuromuscular re-education;Manual techniques;Orthotic Fit/Training;Patient/family education;Cognitive remediation;Passive range of motion;Energy conservation;DME Instruction;Electrical Stimulation;Joint Manipulations    PT Next Visit Plan Continue activites to promote quad actvitiation. Gait training with pt's personal AFO. continue with balance activities on compliant surfaces and L side weight shift. continue to work with the straight cane with rubber quad tip. Goals due 10/15/20.    PT Home Exercise Plan pt to work on standing at counter with L weightshift and work on keeping feet forward while ambulating with hemiwalker    Consulted and Agree with Plan of Care Patient;Family member/caregiver    Family Member Consulted Aunt           Patient will benefit from skilled therapeutic intervention in order to improve the following deficits and impairments:  Abnormal gait,Decreased activity tolerance,Decreased balance,Decreased cognition,Decreased coordination,Decreased endurance,Decreased knowledge of precautions,Decreased mobility,Decreased range of motion,Decreased safety awareness,Decreased strength,Difficulty walking,Impaired perceived functional ability,Impaired  flexibility,Impaired sensation,Impaired tone,Impaired UE functional use,Postural dysfunction,Pain  Visit Diagnosis: Muscle weakness (generalized)  Other abnormalities of gait and mobility  Unsteadiness on feet     Problem List Patient Active Problem List   Diagnosis Date Noted  . Uncontrolled type 2 diabetes mellitus with hyperglycemia, without long-term current use of insulin ([place]) 04/16/2020  . CVA (cerebral vascular accident) ([place]) 04/15/2020  . Cocaine use 04/15/2020  . Acute CVA (cerebrovascular accident) ([place]) 03/04/2020  . Stroke (cerebrum) ([place]) 03/03/2020  . Weakness   . Noncompliance with medication regimen   . TIA (transient ischemic attack) 01/22/2019  . Diabetes mellitus due to underlying condition without complications ([place]) 40/98/1191  . Essential hypertension, benign 05/30/2014  . Smoking 05/30/2014    Jones Bales, PT, DPT 10/05/2020, 12:53 PM  [facility] [address] [place], [place], [address] Phone: [phone]   Fax:  [phone]  Name: Ebony Matthews MRN: [id number] Date of Birth: 03-01-1981

## 2020-10-05 NOTE — Therapy (Signed)
[facility] [address] [place], [place], [address] Phone: [phone]   Fax:  [phone]  Occupational Therapy Treatment & Discharge  Patient Details  Name: Ebony Matthews MRN: [id number] Date of Birth: 1981/09/03 Referring Provider (OT): Dr. Tomi Likens   Encounter Date: 10/05/2020   OT End of Session - 10/05/20 1233    Visit Number 22    Number of Visits 25    Date for OT Re-Evaluation 10/05/20   extended renewal date d/t scheduling and not at end of POC   Authorization Type UHC    Authorization Time Period VL:MN - auth required after 25th visit (Week 12 of 12 10/03/20)    Authorization - Visit Number 22    Authorization - Number of Visits 25    OT Start Time 1234    OT Stop Time 1255    OT Time Calculation (min) 21 min    Equipment Utilized During Treatment gait belt    Activity Tolerance Patient tolerated treatment well    Behavior During Therapy [facility] for tasks assessed/performed;Flat affect           Past Medical History:  Diagnosis Date  . Diabetes mellitus without complication ([place])   . Hypertension   . TIA (transient ischemic attack) 01/22/2019    Past Surgical History:  Procedure Laterality Date  . NO PAST SURGERIES      There were no vitals filed for this visit.   Subjective Assessment - 10/05/20 1244    Subjective  Pt denies any pain this day. Pt reports no changes and no questions regarding discharge this day.    Patient is accompanied by: Family member   aunt   Pertinent History PMH of CVA 2020, CVA June 2021 (R cerebellar) HTN, IIDM, HLD, on going cocaine/polysubstance abuse, non-compliant with medications and presented with new onset of left arm weakness and numbness.    Limitations Fall Risk. Not Driving. Left Inattention    Patient Stated Goals "to be able to move my arms" and "to be able to hold on and grip on stuff" with LUE    Currently in Pain? No/denies           OCCUPATIONAL  THERAPY DISCHARGE SUMMARY  Visits from Start of Care: 22  Current functional level related to goals / functional outcomes: Pt has increased ability to complete some ADLs and IADLS more independently since evaluation. Pt has met 5/6 STGs and 4/7 LTGs. Pt unable to use LUE as stabilizer secondary to limited active movement and weakness in LUE. Pt did not progress to completing basic ADLs of upperbody dressing and bathing with min A however increased independence from evaluation. Pt increased independence with toileting to mod I and is now able to make simple snacks and beverages with mod I.   Remaining deficits: Continues with limited to no activation in LUE impeding overall function.    Education / Equipment: HEP for self-PROM for LUE  Plan: Patient agrees to discharge.  Patient goals were partially met. Patient is being discharged due to meeting the stated rehab goals.  ?????                  OT Treatments/Exercises (OP) - 10/05/20 1255      ADLs   ADL Comments worked on opening containers and problem solving for lack of active movement and functional use of LUE. Pt required assistance for some items but was able to open some with one hand or with use of shelf liner.  OT Short Term Goals - 10/05/20 1241      OT SHORT TERM GOAL #1   Title Pt will be independent with HEP 07/16/2020    Baseline HEP not issued yet    Time 4    Period Weeks    Status Achieved    Target Date 07/16/20      OT SHORT TERM GOAL #2   Title Pt will perform UB dressing with min A and implementing hemitechniques and adapted strategies PRN in order to increase independence with ADLs    Baseline max A for UB dressing    Time 4    Period Weeks    Status Not Met   mod  A 1/5 doing about 20%     OT SHORT TERM GOAL #3   Title Pt will perform bathing with mod A and implementing adapted strategies PRN in order to increase independence with ADLs    Baseline total A  currently for all bathing    Time 4    Period Weeks    Status Achieved   pt reports she is doing most of the wiping of her body     OT SHORT TERM GOAL #4   Title Patient will demonstrate safe positioning and handling for LUE during transfers with min verbal cues 4/5 trials in order to decrease pain and risk of injury.    Baseline currently severe left inattention to LUE    Time 4    Period Weeks    Status Achieved      OT SHORT TERM GOAL #5   Title Pt will complete table top scanning with 50 % accuracy in order to increase awareness to left hemisphere.    Baseline 29/121 or approx 24%    Time 4    Period Weeks    Status Achieved      OT SHORT TERM GOAL #6   Title Patient will prepare a light snack with mod A and adapted strategies PRN in order to increase independence with IADLs.    Baseline total A for snack prep    Time 4    Period Weeks    Status Achieved             OT Long Term Goals - 10/05/20 1239      OT LONG TERM GOAL #1   Title Pt will be independent with updated HEP 08/13/20    Baseline not implemented .    Time 12    Period Weeks    Status Achieved      OT LONG TERM GOAL #2   Title Pt will perform UB dressing with supervision and implementing hemitechniques and adapted strategies PRN in order to increase independence with ADLs    Baseline max A for UB dressing    Time 12    Period Weeks    Status Deferred   min A with front open face jacket     OT LONG TERM GOAL #3   Title Pt will perform bathing with min A and implementing adapted strategies PRN in order to increase independence with ADLs    Baseline total A for bathing at evaluation    Time 12    Period Weeks    Status Not Met   mod A 1/5     OT LONG TERM GOAL #4   Title Patient will demonstrate safe positioning and handling for LUE during transfers Independently (with no verbal cues) 4/5 trials in order to decrease pain and risk of injury.  Baseline currently left inattention to LUE    Time 12     Period Weeks    Status Achieved      OT LONG TERM GOAL #5   Title Pt will complete table top scanning with 75% accuracy in order to increase awareness to left hemisphere.    Baseline 24% accuracy -29/121 double digit cancellation    Time 12    Period Weeks    Status Achieved      OT LONG TERM GOAL #6   Title Patient will prepare a light snack with min A and adapted strategies PRN in order to increase independence with IADLs.    Baseline not preparing snacks    Time 12    Period Weeks    Status Achieved      OT LONG TERM GOAL #7   Title Pt will utilize LUE as a stabilizer with no more than 2 verbal cues in order to increase independence and use of LUE.    Time 12    Period Weeks    Status Deferred   pt unable to use LUE as stabilizer at this time                Plan - 10/05/20 1244    Clinical Impression Statement Pt has met 5/6 STGs and 4/7 LTGs. Pt unable to use LUE as stabilizer secondary to limited active movement and weakness in LUE. Pt did not progress to completing basic ADLs of upperbody dressing and bathing with increased independence however increased independence from evaluation.    OT Occupational Profile and History Detailed Assessment- Review of Records and additional review of physical, cognitive, psychosocial history related to current functional performance    Occupational performance deficits (Please refer to evaluation for details): IADL's;ADL's;Rest and Sleep;Education    Body Structure / Function / Physical Skills ADL;Strength;Gait;Decreased knowledge of use of DME;Balance;Pain;UE functional use;GMC;Dexterity;Endurance;IADL;ROM;Vision;Coordination;Flexibility;Mobility;Sensation;FMC;Decreased knowledge of precautions    Cognitive Skills Attention;Safety Awareness;Problem Solve;Understand;Sequencing    Rehab Potential Good    Clinical Decision Making Several treatment options, min-mod task modification necessary    Comorbidities Affecting Occupational  Performance: May have comorbidities impacting occupational performance    Modification or Assistance to Complete Evaluation  Min-Moderate modification of tasks or assist with assess necessary to complete eval    OT Frequency 2x / week    OT Duration 12 weeks    OT Treatment/Interventions Self-care/ADL training;Moist Heat;DME and/or AE instruction;Splinting;Balance training;Fluidtherapy;Gait Training;Therapeutic activities;Therapeutic exercise;Ultrasound;Electrical Stimulation;Energy conservation;Manual Therapy;Patient/family education;Visual/perceptual remediation/compensation;Passive range of motion;Functional Mobility Training;Neuromuscular education    Plan discharge    Consulted and Agree with Plan of Care Patient;Family member/caregiver    Family Member Consulted Aunt           Patient will benefit from skilled therapeutic intervention in order to improve the following deficits and impairments:   Body Structure / Function / Physical Skills: ADL,Strength,Gait,Decreased knowledge of use of DME,Balance,Pain,UE functional use,GMC,Dexterity,Endurance,IADL,ROM,Vision,Coordination,Flexibility,Mobility,Sensation,FMC,Decreased knowledge of precautions Cognitive Skills: Attention,Safety Awareness,Problem Solve,Understand,Sequencing     Visit Diagnosis: Hemiplegia and hemiparesis following cerebral infarction affecting left non-dominant side (HCC)  Muscle weakness (generalized)  Other abnormalities of gait and mobility  Other lack of coordination  Stiffness of left shoulder, not elsewhere classified  Attention and concentration deficit    Problem List Patient Active Problem List   Diagnosis Date Noted  . Uncontrolled type 2 diabetes mellitus with hyperglycemia, without long-term current use of insulin ([place]) 04/16/2020  . CVA (cerebral vascular accident) ([place]) 04/15/2020  . Cocaine use 04/15/2020  . Acute CVA (  cerebrovascular accident) ([place]) 03/04/2020  . Stroke (cerebrum) ([place])  03/03/2020  . Weakness   . Noncompliance with medication regimen   . TIA (transient ischemic attack) 01/22/2019  . Diabetes mellitus due to underlying condition without complications ([place]) 56/23/9215  . Essential hypertension, benign 05/30/2014  . Smoking 05/30/2014    Zachery Conch MOT, OTR/L  10/05/2020, 12:56 PM  [facility] [address] [place], [place], [address] Phone: [phone]   Fax:  [phone]  Name: Tambria Pfannenstiel MRN: [id number] Date of Birth: [birth year]

## 2020-10-09 ENCOUNTER — Ambulatory Visit: Payer: Medicaid Other | Admitting: Occupational Therapy

## 2020-10-09 ENCOUNTER — Other Ambulatory Visit: Payer: Self-pay

## 2020-10-09 ENCOUNTER — Ambulatory Visit: Payer: Medicaid Other

## 2020-10-09 DIAGNOSIS — R2689 Other abnormalities of gait and mobility: Secondary | ICD-10-CM

## 2020-10-09 DIAGNOSIS — I69354 Hemiplegia and hemiparesis following cerebral infarction affecting left non-dominant side: Secondary | ICD-10-CM

## 2020-10-09 DIAGNOSIS — M6281 Muscle weakness (generalized): Secondary | ICD-10-CM

## 2020-10-09 DIAGNOSIS — R2681 Unsteadiness on feet: Secondary | ICD-10-CM

## 2020-10-09 NOTE — Therapy (Signed)
[facility] [address] [place], [place], [address] Phone: [phone]   Fax:  [phone]  Physical Therapy Treatment  Patient Details  Name: Ebony Matthews MRN: [id number] Date of Birth: 1981/09/20 Referring Provider (PT): Referred by: Georgiann Cocker, MD. Followed by Shon Millet, MD   Encounter Date: 10/09/2020   PT End of Session - 10/09/20 1229    Visit Number 38    Date for PT Re-Evaluation 10/15/20    Authorization Type U[employer]    PT Start Time 1230    PT Stop Time 1314    PT Time Calculation (min) 44 min    Equipment Utilized During Treatment Gait belt;Other (comment)   pt's custom AFO   Activity Tolerance Patient tolerated treatment well    Behavior During Therapy WFL for tasks assessed/performed           Past Medical History:  Diagnosis Date  . Diabetes mellitus without complication (HCC)   . Hypertension   . TIA (transient ischemic attack) 01/22/2019    Past Surgical History:  Procedure Laterality Date  . NO PAST SURGERIES      There were no vitals filed for this visit.   Subjective Assessment - 10/09/20 1233    Subjective No new changes/complaints. No falls or pain to report.    Patient is accompained by: Family member   aunt   Pertinent History hx of multiple CVA, DM, HTN, drug abuse    Limitations Standing;Walking;House hold activities;Lifting    How long can you sit comfortably? no issues    How long can you stand comfortably? <30 sec    How long can you walk comfortably? <10 feet with quad cane    Patient Stated Goals Get my balance better.    Currently in Pain? No/denies               [facility] Adult PT Treatment/Exercise - 10/09/20 0001      Transfers   Transfers Sit to Stand;Stand to Sit    Sit to Stand 5: Supervision    Stand to Sit 5: Supervision      Ambulation/Gait   Ambulation/Gait Yes    Ambulation/Gait Assistance 5: Supervision;4: Min guard    Ambulation/Gait  Assistance Details Completed ambulation with SPC with quad tip attachment, completed x 115 ft. Verbal cues for improved weight shift and step length required.    Ambulation Distance (Feet) 115 Feet    Assistive device Straight cane   w/ quad tip attachment   Gait Pattern Decreased arm swing - left;Decreased arm swing - right;Decreased hip/knee flexion - left;Decreased dorsiflexion - left;Decreased weight shift to left;Decreased stance time - left;Wide base of support;Step-through pattern    Ambulation Surface Level;Indoor      Neuro Re-ed    Neuro Re-ed Details  at countertop completed standing weight shifts laterally with feet even x 10 reps, PT providing faciliation intemrittently. Progressed to completing with staggered stance x 10 reps with RLE positioned posteriorly.      Exercises   Exercises Other Exercises    Other Exercises  Completed entire review of current HEP and progressed as tolerated.          Completed all of the following activities during session as review for current HEP. Patient tolerating progression of the following bolded exercises. Intermittent verbal cues required for proper completion.     Access Code: [email or web address] URL: [email or web address] Date: 10/09/2020 Prepared by: Jethro Bastos  Exercises Supine Heel Slide - 2 x daily - 5  x weekly - 1 sets - 10 reps Small Range Straight Leg Raise - 2 x daily - 5 x weekly - 1 sets - 10 reps Hooklying Single Leg Bent Knee Fallouts with Resistance - 1 x daily - 5 x weekly - 2 sets - 10 reps Supine March with Resistance Band - 1 x daily - 5 x weekly - 2 sets - 10 reps Clamshell with Resistance - 1 x daily - 5 x weekly - 2 sets - 10 reps Side to Side Weight Shift with Counter Support - 1 x daily - 5 x weekly - 1 sets - 10 reps Staggered Stance Forward Backward Weight Shift with Counter Support - 1 x daily - 5 x weekly - 1 sets - 10 reps  Patient Education Falls at Home Checklist       PT Education -  10/09/20 1313    Education Details HEP Update    Person(s) Educated Patient;Other (comment)    Methods Explanation;Demonstration;Handout    Comprehension Verbalized understanding;Returned demonstration;Verbal cues required            PT Short Term Goals - 09/17/20 1739      PT SHORT TERM GOAL #1   Title Patient and caregiver will report walking daily in home for improved household mobility (ALL STGs Due: 07/27/2020)    Baseline continues to report not walking daily, currently walking approx 2-3x/week    Time 3    Period Weeks    Status Achieved    Target Date 07/27/20      PT SHORT TERM GOAL #2   Title Patient will improve TUG to <1 min to demonstrate improved functional mobility    Baseline 1 min, 32 secs; 10/28 1 min, 17 secs; 09/17/20  53 sec    Time 3    Period Weeks    Status Achieved      PT SHORT TERM GOAL #3   Title Patient will improve gait speed to > 1.0 ft sec to demonstrate improved household mobility    Baseline deferred as unable to walk without assistance    Time 3    Period Weeks    Status On-going             PT Long Term Goals - 09/17/20 1740      PT LONG TERM GOAL #1   Title Patient will report independence with progressive HEP with caregiver asssistance (ALL LTG's Due: 10/15/20)    Baseline continue to progress HEP    Time 4    Period Weeks    Status On-going    Target Date 10/15/20      PT LONG TERM GOAL #2   Title Patient will demo ability to ambulate >250 ft with LRAD and CGA to demo improved functional mobility and ambulation within the home    Baseline 14 ft, Min A; 207' with CGA and hemi walker; 345' SBA with hemiwalker + L AFO    Time 6    Period Weeks    Status Achieved      PT LONG TERM GOAL #3   Title Patient will demo ability to complete TUG  < 50 seconds to demo improved ambulation and mobility    Baseline 1 min 32 secs    Time 4    Period Weeks    Status On-going    Target Date 10/15/20      PT LONG TERM GOAL #4   Title  Patient will improve gait speed to >/= 1.5 ft/sec to  demonstrate improved mobility    Baseline 0.8 ft/sec on 09/17/20    Time 4    Period Weeks    Status On-going    Target Date 10/15/20                 Plan - 10/09/20 1336    Clinical Impression Statement Focus of session spent reviewing current HEP and progressing to patient's tolerance. Patient tolerating increased resistance with activities demonstrating improved strength in the LLE. Continued gait training with SPC with quad tip attachment. Will continue to progress toward all LTGs.    Personal Factors and Comorbidities Comorbidity 3+;Past/Current Experience;Time since onset of injury/illness/exacerbation;Transportation;Behavior Pattern    Comorbidities DM, HTN, hx of multiple strokes in last 6 months    Examination-Activity Limitations Bathing;Bed Mobility;Caring for Others;Carry;Dressing;Hygiene/Grooming;Lift;Reach Overhead;Squat    Examination-Participation Restrictions Cleaning;Community Activity;Driving;Laundry;Yard Work;Occupation;Church;Meal Prep    Stability/Clinical Decision Making Evolving/Moderate complexity    Rehab Potential Good    PT Frequency 2x / week    PT Duration 6 weeks    PT Treatment/Interventions ADLs/Self Care Home Management;Gait training;Stair training;Functional mobility training;Therapeutic activities;Therapeutic exercise;Balance training;Neuromuscular re-education;Manual techniques;Orthotic Fit/Training;Patient/family education;Cognitive remediation;Passive range of motion;Energy conservation;DME Instruction;Electrical Stimulation;Joint Manipulations    PT Next Visit Plan Re-Cert or Discharge. How was HEP update? Continue activites to promote quad actvitiation. Gait training with pt's personal AFO. continue with balance activities on compliant surfaces and L side weight shift. continue to work with the straight cane with rubber quad tip.    PT Home Exercise Plan pt to work on standing at counter with L  weightshift and work on keeping feet forward while ambulating with hemiwalker    Consulted and Agree with Plan of Care Patient;Family member/caregiver    Family Member Consulted Aunt           Patient will benefit from skilled therapeutic intervention in order to improve the following deficits and impairments:  Abnormal gait,Decreased activity tolerance,Decreased balance,Decreased cognition,Decreased coordination,Decreased endurance,Decreased knowledge of precautions,Decreased mobility,Decreased range of motion,Decreased safety awareness,Decreased strength,Difficulty walking,Impaired perceived functional ability,Impaired flexibility,Impaired sensation,Impaired tone,Impaired UE functional use,Postural dysfunction,Pain  Visit Diagnosis: Muscle weakness (generalized)  Unsteadiness on feet  Other abnormalities of gait and mobility  Hemiplegia and hemiparesis following cerebral infarction affecting left non-dominant side [facility])     Problem List Patient Active Problem List   Diagnosis Date Noted  . Uncontrolled type 2 diabetes mellitus with hyperglycemia, without long-term current use of insulin (HCC) 04/16/2020  . CVA (cerebral vascular accident) (HCC) 04/15/2020  . Cocaine use 04/15/2020  . Acute CVA (cerebrovascular accident) (HCC) 03/04/2020  . Stroke (cerebrum) (HCC) 03/03/2020  . Weakness   . Noncompliance with medication regimen   . TIA (transient ischemic attack) 01/22/2019  . Diabetes mellitus due to underlying condition without complications (HCC) 05/30/2014  . Essential hypertension, benign 05/30/2014  . Smoking 05/30/2014    Tempie Donning, PT, DPT 10/09/2020, 1:43 PM  [facility] [address] [place], [place], [address] Phone: [phone]   Fax:  [phone]  Name: Ebony Matthews MRN: [id number] Date of Birth: [birth year]

## 2020-10-09 NOTE — Patient Instructions (Signed)
Access Code: [email or web address] URL: [email or web address] Date: 10/09/2020 Prepared by: Jethro Bastos  Exercises Supine Heel Slide - 2 x daily - 5 x weekly - 1 sets - 10 reps Small Range Straight Leg Raise - 2 x daily - 5 x weekly - 1 sets - 10 reps Hooklying Single Leg Bent Knee Fallouts with Resistance - 1 x daily - 5 x weekly - 2 sets - 10 reps Supine March with Resistance Band - 1 x daily - 5 x weekly - 2 sets - 10 reps Clamshell with Resistance - 1 x daily - 5 x weekly - 2 sets - 10 reps Side to Side Weight Shift with Counter Support - 1 x daily - 5 x weekly - 1 sets - 10 reps Staggered Stance Forward Backward Weight Shift with Counter Support - 1 x daily - 5 x weekly - 1 sets - 10 reps  Patient Education Falls at Home Checklist

## 2020-10-10 ENCOUNTER — Inpatient Hospital Stay: Payer: Medicaid Other

## 2020-10-10 DIAGNOSIS — I63231 Cerebral infarction due to unspecified occlusion or stenosis of right carotid arteries: Secondary | ICD-10-CM

## 2020-10-10 DIAGNOSIS — I6349 Cerebral infarction due to embolism of other cerebral artery: Secondary | ICD-10-CM

## 2020-10-10 DIAGNOSIS — I10 Essential (primary) hypertension: Secondary | ICD-10-CM

## 2020-10-12 ENCOUNTER — Ambulatory Visit: Payer: Medicaid Other | Admitting: Occupational Therapy

## 2020-10-12 ENCOUNTER — Ambulatory Visit: Payer: Medicaid Other

## 2020-10-18 ENCOUNTER — Encounter: Payer: Medicaid Other | Admitting: Dietician

## 2020-11-01 ENCOUNTER — Ambulatory Visit: Payer: Medicaid Other | Attending: Internal Medicine

## 2020-11-01 ENCOUNTER — Other Ambulatory Visit: Payer: Self-pay

## 2020-11-01 DIAGNOSIS — I69354 Hemiplegia and hemiparesis following cerebral infarction affecting left non-dominant side: Secondary | ICD-10-CM | POA: Insufficient documentation

## 2020-11-01 DIAGNOSIS — M6281 Muscle weakness (generalized): Secondary | ICD-10-CM | POA: Insufficient documentation

## 2020-11-01 DIAGNOSIS — R296 Repeated falls: Secondary | ICD-10-CM | POA: Diagnosis present

## 2020-11-01 DIAGNOSIS — R2681 Unsteadiness on feet: Secondary | ICD-10-CM | POA: Diagnosis present

## 2020-11-01 DIAGNOSIS — R2689 Other abnormalities of gait and mobility: Secondary | ICD-10-CM | POA: Diagnosis present

## 2020-11-01 NOTE — Therapy (Signed)
[facility] [address] [place], [place], [address] Phone: [phone]   Fax:  [phone]  Physical Therapy Treatment/Re-Certification  Patient Details  Name: Ebony Matthews MRN: [id number] Date of Birth: [birth year] Referring Provider (PT): Referred by: Jordan Hawks, MD. Followed by Metta Clines, MD   Encounter Date: 11/01/2020   PT End of Session - 11/01/20 1019    Visit Number 39    Number of Visits 43    Date for PT Re-Evaluation --   Re-Eval due at 43rd visit   Authorization Type [employer] Medicaid    PT Start Time 1016    PT Stop Time 1058    PT Time Calculation (min) 42 min    Equipment Utilized During Treatment Gait belt;Other (comment)   pt's custom AFO   Activity Tolerance Patient tolerated treatment well    Behavior During Therapy WFL for tasks assessed/performed           Past Medical History:  Diagnosis Date  . Diabetes mellitus without complication ([place])   . Hypertension   . TIA (transient ischemic attack) 01/22/2019    Past Surgical History:  Procedure Laterality Date  . NO PAST SURGERIES      There were no vitals filed for this visit.   Subjective Assessment - 11/01/20 1020    Subjective Patient reports no new changes. Did have one fall where she leaned to far left and fell over, denies any injuries.  No pain.    Patient is accompained by: Family member   aunt   Pertinent History hx of multiple CVA, DM, HTN, drug abuse    Limitations Standing;Walking;House hold activities;Lifting    How long can you sit comfortably? no issues    How long can you stand comfortably? <30 sec    How long can you walk comfortably? <10 feet with quad cane    Patient Stated Goals Get my balance better.    Currently in Pain? No/denies              [facility] PT Assessment - 11/01/20 0001      Assessment   Medical Diagnosis CVA with ankle fracture L    Referring Provider (PT) Referred by: Jordan Hawks, MD.  Followed by Metta Clines, MD             [facility] Adult PT Treatment/Exercise - 11/01/20 0001      Transfers   Transfers Sit to Stand;Stand to Sit    Sit to Stand 5: Supervision    Stand to Sit 5: Supervision    Comments one instance of verbal cues required for foot position of LLE.      Ambulation/Gait   Ambulation/Gait Yes    Ambulation/Gait Assistance 5: Supervision;4: Min guard    Ambulation/Gait Assistance Details Patient ambulating into session with [facility], patient reports that she has had this and has decided to use since not utilizing hemi-walker anymore. AD not at appropriate height and backwards. PT adjusting for patients height, and educating on proper use of AD. Completed ambulation x 230 ft with [facility] with close supervision throughout.    Ambulation Distance (Feet) 230 Feet    Assistive device Large base quad cane    Gait Pattern Decreased arm swing - left;Decreased arm swing - right;Decreased hip/knee flexion - left;Decreased dorsiflexion - left;Decreased weight shift to left;Decreased stance time - left;Wide base of support;Step-through pattern    Ambulation Surface Level;Indoor    Gait velocity 40.40 secs = 0.81 ft/sec  Timed Up and Go Test   TUG Normal TUG    Normal TUG (seconds) 54.16   with [facility]     Exercises   Exercises Other Exercises    Other Exercises  Patient verbalizing decreased freq completing HEP due to frustration. PT verbal reviewing current HEP, with patient report most frustration with SLR and supine heel slides. Attempted heel slides x 5 reps to promote improved completion but patient still demo increased challenge with this. Completed supine quad sets with towel x 10 reps with 3 second hold. PT updated HEP to include this but did not provide handout today. Will plan to continue to address HEP at next visit and new handout provided to promote improved compliance and decreased frustration with HEP.                  PT Education - 11/01/20 1021     Education Details Progress toward LTG; Updated POC    Person(s) Educated Patient    Methods Explanation    Comprehension Verbalized understanding             Updated Short Term Goals:   PT Short Term Goals - 11/01/20 1207      PT SHORT TERM GOAL #1   Title = LTGs          Long Term Goals:    11/01/20 1022  PT LONG TERM GOAL #1  Title Patient will report independence with progressive HEP with caregiver asssistance (ALL LTG's Due: 10/15/20)  Baseline reports independence, completing 1x/weekly  Time 4  Period Weeks  Status Partially Met  PT LONG TERM GOAL #2  Title Patient will demo ability to ambulate >250 ft with LRAD and CGA to demo improved functional mobility and ambulation within the home  Baseline 14 ft, Min A; 207' with CGA and hemi walker; 345' SBA with hemiwalker + L AFO  Time 6  Period Weeks  Status Achieved  PT LONG TERM GOAL #3  Title Patient will demo ability to complete TUG  < 50 seconds to demo improved ambulation and mobility  Baseline 1 min 32 secs; 54 secs  Time 4  Period Weeks  Status Not Met  PT LONG TERM GOAL #4  Title Patient will improve gait speed to >/= 1.5 ft/sec to demonstrate improved mobility  Baseline 0.8 ft/sec on 09/17/20; 0.81 ft/sec with [facility] on 11/01/20  Time 4  Period Weeks  Status Not Met    Updated Long Term Goals:   PT Long Term Goals - 11/01/20 1208      PT LONG TERM GOAL #1   Title Patient will report independence with progressive HEP with caregiver asssistance, and report completing >/= 3x/weekly (ALL LTG's Due: 10/15/20)    Baseline reports independence, completing 1x/weekly    Time 4    Period Weeks    Status Revised      PT LONG TERM GOAL #2   Title Patient will demo ability to ambulate >250 ft with LRAD and CGA on various surfaces inlcuding outdoors to to demo improved ambulation within the community    Baseline 345' SBA with hemiwalker + L AFO (indoors)    Time 4    Period Weeks    Status Revised      PT LONG  TERM GOAL #3   Title Patient will demo ability to complete TUG  < 50 seconds to demo improved ambulation and mobility    Baseline 1 min 32 secs; 54 secs    Time 4  Period Weeks    Status On-going      PT LONG TERM GOAL #4   Title Patient will improve gait speed to >/= 1.0 ft/sec to demonstrate improved mobility    Baseline 0.8 ft/sec on 09/17/20; 0.81 ft/sec with [facility] on 11/01/20    Time 4    Period Weeks    Status Revised                 Plan - 11/01/20 1204    Clinical Impression Statement Today's skilled session focused on progress toward LTG's. Patient able to partially meet LTG #1, meet LTG #2, and made progress toward LTG #3 and 4. Patient demonstrating improved ambulation with LRAD (currently utilizing [facility] today during session). Patient improved TUG to 54 seconds today demonstrating reduced fall risk. Patient reporting decreased compliance with HEP due to frustration, PT began to assess HEP in today's session and made adequate changes to promote improved compliance. Will plan to finish at next session. Patient is making slow steady progress toward all LTGs and will continue to benefit from skilled PT services.    Personal Factors and Comorbidities Comorbidity 3+;Past/Current Experience;Time since onset of injury/illness/exacerbation;Transportation;Behavior Pattern    Comorbidities DM, HTN, hx of multiple strokes in last 6 months    Examination-Activity Limitations Bathing;Bed Mobility;Caring for Others;Carry;Dressing;Hygiene/Grooming;Lift;Reach Overhead;Squat    Examination-Participation Restrictions Cleaning;Community Activity;Driving;Laundry;Yard Work;Occupation;Church;Meal Prep    Stability/Clinical Decision Making Evolving/Moderate complexity    Rehab Potential Good    PT Frequency 1x / week    PT Duration 4 weeks    PT Treatment/Interventions ADLs/Self Care Home Management;Gait training;Stair training;Functional mobility training;Therapeutic activities;Therapeutic  exercise;Balance training;Neuromuscular re-education;Manual techniques;Orthotic Fit/Training;Patient/family education;Cognitive remediation;Passive range of motion;Energy conservation;DME Instruction;Electrical Stimulation;Joint Manipulations    PT Next Visit Plan Finish updating HEP (added quad set but no paper provided, will need to update heel slides due to frustration, and provide new handout). Gait outdoors? Continue activites to promote quad actvitiation. Gait training with pt's personal AFO. continue with balance activities on compliant surfaces and L side weight shift. continue to work with the straight cane with rubber quad tip.    PT Home Exercise Plan pt to work on standing at counter with L weightshift and work on keeping feet forward while ambulating with hemiwalker    Consulted and Agree with Plan of Care Patient;Family member/caregiver    Family Member Consulted Aunt           Patient will benefit from skilled therapeutic intervention in order to improve the following deficits and impairments:  Abnormal gait,Decreased activity tolerance,Decreased balance,Decreased cognition,Decreased coordination,Decreased endurance,Decreased knowledge of precautions,Decreased mobility,Decreased range of motion,Decreased safety awareness,Decreased strength,Difficulty walking,Impaired perceived functional ability,Impaired flexibility,Impaired sensation,Impaired tone,Impaired UE functional use,Postural dysfunction,Pain  Visit Diagnosis: Muscle weakness (generalized)  Unsteadiness on feet  Other abnormalities of gait and mobility  Repeated falls     Problem List Patient Active Problem List   Diagnosis Date Noted  . Uncontrolled type 2 diabetes mellitus with hyperglycemia, without long-term current use of insulin ([place]) 04/16/2020  . CVA (cerebral vascular accident) ([place]) 04/15/2020  . Cocaine use 04/15/2020  . Acute CVA (cerebrovascular accident) ([place]) 03/04/2020  . Stroke (cerebrum) ([place])  03/03/2020  . Weakness   . Noncompliance with medication regimen   . TIA (transient ischemic attack) 01/22/2019  . Diabetes mellitus due to underlying condition without complications ([place]) 62/37/6283  . Essential hypertension, benign 05/30/2014  . Smoking 05/30/2014    Jones Bales, PT, DPT 11/01/2020, 12:09 PM  [facility]  [address] [place], [place], [address] Phone: [phone]   Fax:  [phone]  Name: Ebony Matthews MRN: [id number] Date of Birth: July 31, 1981

## 2020-11-07 ENCOUNTER — Ambulatory Visit: Payer: Medicaid Other

## 2020-11-07 ENCOUNTER — Other Ambulatory Visit: Payer: Self-pay

## 2020-11-07 DIAGNOSIS — I69354 Hemiplegia and hemiparesis following cerebral infarction affecting left non-dominant side: Secondary | ICD-10-CM

## 2020-11-07 DIAGNOSIS — R2681 Unsteadiness on feet: Secondary | ICD-10-CM

## 2020-11-07 DIAGNOSIS — M6281 Muscle weakness (generalized): Secondary | ICD-10-CM

## 2020-11-07 NOTE — Therapy (Signed)
[facility] [address] [place], [place], [address] Phone: [phone]   Fax:  [phone]  Physical Therapy Treatment  Patient Details  Name: Ebony Matthews MRN: [id number] Date of Birth: [birth year] Referring Provider (PT): Referred by: Georgiann Cocker, MD. Followed by Shon Millet, MD   Encounter Date: 11/07/2020   PT End of Session - 11/07/20 1211    Visit Number 40    Number of Visits 43    Date for PT Re-Evaluation --   Re-Eval due at 43rd visit   Authorization Type [employer] Medicaid    PT Start Time 1015    PT Stop Time 1100    PT Time Calculation (min) 45 min    Equipment Utilized During Treatment Gait belt;Other (comment)   pt's custom AFO   Activity Tolerance Patient tolerated treatment well    Behavior During Therapy WFL for tasks assessed/performed           Past Medical History:  Diagnosis Date  . Diabetes mellitus without complication (HCC)   . Hypertension   . TIA (transient ischemic attack) 01/22/2019    Past Surgical History:  Procedure Laterality Date  . NO PAST SURGERIES      There were no vitals filed for this visit.   Subjective Assessment - 11/07/20 1021    Subjective Patient reports no new changes. relates no residual issues from fall last week.    Patient is accompained by: Family member   aunt   Pertinent History hx of multiple CVA, DM, HTN, drug abuse    Limitations Standing;Walking;House hold activities;Lifting    How long can you sit comfortably? no issues    How long can you stand comfortably? <30 sec    How long can you walk comfortably? <10 feet with quad cane    Patient Stated Goals Get my balance better and walk on grass    Currently in Pain? No/denies                             [facility] Adult PT Treatment/Exercise - 11/07/20 0001      Transfers   Transfers Sit to Stand;Stand to Sit    Sit to Stand 4: Min guard    Sit to Stand Details Verbal cues for  sequencing;Verbal cues for technique;Verbal cues for safe use of DME/AE;Manual facilitation for weight shifting;Manual facilitation for placement    Stand to Sit 5: Supervision    Stand to Sit Details (indicate cue type and reason) Verbal cues for sequencing;Verbal cues for technique;Verbal cues for safe use of DME/AE    Comments required occasional tatile cuing for proper L foot position/placement      Ambulation/Gait   Ambulation/Gait Yes    Ambulation/Gait Assistance 4: Min guard    Ambulation Distance (Feet) 230 Feet    Assistive device Large base quad cane    Gait Pattern Decreased arm swing - left;Left circumduction    Ambulation Surface Indoor;Level      High Level Balance   High Level Balance Activities Backward walking    High Level Balance Comments performed forward and backward walking in // bars across blue mat, RUE support and CGA with tactile cuing for proper L foot placement      Exercises   Other Exercises  reviewed HEP of supine bridging, heel slides, clamshells, 1x10 with tactile cues for proper form  PT Short Term Goals - 11/01/20 1207      PT SHORT TERM GOAL #1   Title = LTGs             PT Long Term Goals - 11/01/20 1208      PT LONG TERM GOAL #1   Title Patient will report independence with progressive HEP with caregiver asssistance, and report completing >/= 3x/weekly (ALL LTG's Due: 10/15/20)    Baseline reports independence, completing 1x/weekly    Time 4    Period Weeks    Status Revised      PT LONG TERM GOAL #2   Title Patient will demo ability to ambulate >250 ft with LRAD and CGA on various surfaces inlcuding outdoors to to demo improved ambulation within the community    Baseline 345' SBA with hemiwalker + L AFO (indoors)    Time 4    Period Weeks    Status Revised      PT LONG TERM GOAL #3   Title Patient will demo ability to complete TUG  < 50 seconds to demo improved ambulation and mobility    Baseline 1  min 32 secs; 54 secs    Time 4    Period Weeks    Status On-going      PT LONG TERM GOAL #4   Title Patient will improve gait speed to >/= 1.0 ft/sec to demonstrate improved mobility    Baseline 0.8 ft/sec on 09/17/20; 0.81 ft/sec with LBBQC on 11/01/20    Time 4    Period Weeks    Status Revised                 Plan - 11/07/20 1211    Clinical Impression Statement continued balance and gait training with tactile cues for L weight shifting during ambulation and stepping tasks, no LOB noted but difficulty noted with L foot placement, ambulation across blue mat to simulate walking on grass fwd/bwd with CGA and need oftactle cues for L foot placement    Personal Factors and Comorbidities Comorbidity 3+;Past/Current Experience;Time since onset of injury/illness/exacerbation;Transportation;Behavior Pattern    Comorbidities DM, HTN, hx of multiple strokes in last 6 months    Examination-Activity Limitations Bathing;Bed Mobility;Caring for Others;Carry;Dressing;Hygiene/Grooming;Lift;Reach Overhead;Squat    Examination-Participation Restrictions Cleaning;Community Activity;Driving;Laundry;Yard Work;Occupation;Church;Meal Prep    Stability/Clinical Decision Making Evolving/Moderate complexity    Rehab Potential Good    PT Frequency 1x / week    PT Duration 4 weeks    PT Treatment/Interventions ADLs/Self Care Home Management;Gait training;Stair training;Functional mobility training;Therapeutic activities;Therapeutic exercise;Balance training;Neuromuscular re-education;Manual techniques;Orthotic Fit/Training;Patient/family education;Cognitive remediation;Passive range of motion;Energy conservation;DME Instruction;Electrical Stimulation;Joint Manipulations    PT Next Visit Plan encourage hip/knee flexion activities to allow L foot to place properly and avoid circumduction, continue ambulation across unlevel surfaces, add to HEPas needed, may benefit from stepping tasks    Consulted and Agree with  Plan of Care Patient;Family member/caregiver    Family Member Consulted Aunt           Patient will benefit from skilled therapeutic intervention in order to improve the following deficits and impairments:  Abnormal gait,Decreased activity tolerance,Decreased balance,Decreased cognition,Decreased coordination,Decreased endurance,Decreased knowledge of precautions,Decreased mobility,Decreased range of motion,Decreased safety awareness,Decreased strength,Difficulty walking,Impaired perceived functional ability,Impaired flexibility,Impaired sensation,Impaired tone,Impaired UE functional use,Postural dysfunction,Pain  Visit Diagnosis: Muscle weakness (generalized)  Unsteadiness on feet  Hemiplegia and hemiparesis following cerebral infarction affecting left non-dominant side [facility])     Problem List Patient Active Problem List   Diagnosis Date Noted  .  Uncontrolled type 2 diabetes mellitus with hyperglycemia, without long-term current use of insulin (HCC) 04/16/2020  . CVA (cerebral vascular accident) (HCC) 04/15/2020  . Cocaine use 04/15/2020  . Acute CVA (cerebrovascular accident) (HCC) 03/04/2020  . Stroke (cerebrum) (HCC) 03/03/2020  . Weakness   . Noncompliance with medication regimen   . TIA (transient ischemic attack) 01/22/2019  . Diabetes mellitus due to underlying condition without complications (HCC) 05/30/2014  . Essential hypertension, benign 05/30/2014  . Smoking 05/30/2014    Hildred Laser 11/07/2020, 12:21 PM  [facility] [address] [place], [place], [address] Phone: [phone]   Fax:  [phone]  Name: Shelagh Rayman MRN: [id number] Date of Birth: 1981/03/19

## 2020-11-08 ENCOUNTER — Telehealth: Payer: Self-pay | Admitting: Cardiology

## 2020-11-08 DIAGNOSIS — I63231 Cerebral infarction due to unspecified occlusion or stenosis of right carotid arteries: Secondary | ICD-10-CM

## 2020-11-08 DIAGNOSIS — I10 Essential (primary) hypertension: Secondary | ICD-10-CM

## 2020-11-09 NOTE — Telephone Encounter (Signed)
Order added.

## 2020-11-09 NOTE — Telephone Encounter (Signed)
I called Ebony Matthews to discuss the cardiac monitoring results.  There were no findings of atrial fibrillation.  We discussed the implantable loop recorder.  She is interested in pursuing this.  She reports that she has not been using cocaine and has been compliant with her medications.  We will refer her to cardiology.

## 2020-11-14 ENCOUNTER — Other Ambulatory Visit: Payer: Self-pay

## 2020-11-14 ENCOUNTER — Ambulatory Visit: Payer: Medicaid Other

## 2020-11-14 DIAGNOSIS — M6281 Muscle weakness (generalized): Secondary | ICD-10-CM | POA: Diagnosis not present

## 2020-11-14 DIAGNOSIS — R2689 Other abnormalities of gait and mobility: Secondary | ICD-10-CM

## 2020-11-14 DIAGNOSIS — R2681 Unsteadiness on feet: Secondary | ICD-10-CM

## 2020-11-14 NOTE — Therapy (Addendum)
[facility] [address] [place], [place], [address] Phone: [phone]   Fax:  [phone]  Physical Therapy Treatment  Patient Details  Name: Ebony Matthews MRN: [id number] Date of Birth: 07-Dec-1980 Referring Provider (PT): Referred by: Georgiann Cocker, MD. Followed by Shon Millet, MD   Encounter Date: 11/14/2020   PT End of Session - 11/14/20 1015    Visit Number 41    Number of Visits 43    Date for PT Re-Evaluation --   Re-Eval due at 43rd visit   Authorization Type [employer] Medicaid    PT Start Time 1015    PT Stop Time 1058    PT Time Calculation (min) 43 min    Equipment Utilized During Treatment Gait belt;Other (comment)   pt's custom AFO   Activity Tolerance Patient tolerated treatment well    Behavior During Therapy WFL for tasks assessed/performed           Past Medical History:  Diagnosis Date  . Diabetes mellitus without complication (HCC)   . Hypertension   . TIA (transient ischemic attack) 01/22/2019    Past Surgical History:  Procedure Laterality Date  . NO PAST SURGERIES      There were no vitals filed for this visit.   Subjective Assessment - 11/14/20 1020    Subjective Patient reports no new changes/complaints. No more falls to report. Patient reports walking with the [facility] contineus to go well.    Patient is accompained by: Family member   aunt   Pertinent History hx of multiple CVA, DM, HTN, drug abuse    Limitations Standing;Walking;House hold activities;Lifting    How long can you sit comfortably? no issues    How long can you stand comfortably? <30 sec    How long can you walk comfortably? <10 feet with quad cane    Patient Stated Goals Get my balance better and walk on grass    Currently in Pain? No/denies               [facility] Adult PT Treatment/Exercise - 11/14/20 0001      Transfers   Transfers Sit to Stand;Stand to Sit    Sit to Stand 4: Min guard    Sit to Stand Details  Verbal cues for sequencing;Verbal cues for technique;Verbal cues for safe use of DME/AE;Manual facilitation for weight shifting;Manual facilitation for placement    Stand to Sit 5: Supervision    Stand to Sit Details (indicate cue type and reason) Verbal cues for sequencing;Verbal cues for technique;Verbal cues for safe use of DME/AE    Comments completed sit <> stands with BLE placed on airex for balance challenge. CGA - Min A required for completion, due to balance challenge.      Ambulation/Gait   Ambulation/Gait Yes    Ambulation/Gait Assistance 4: Min guard    Ambulation/Gait Assistance Details completed ambulation x 80 ft into therapy session. increased recurvatum noted on LLE. patient did not wear brace today on LLE.    Ambulation Distance (Feet) 80 Feet    Assistive device Large base quad cane    Gait Pattern Decreased arm swing - left;Left circumduction    Ambulation Surface Level;Indoor      Neuro Re-ed    Neuro Re-ed Details  Completed standing balance on blue mat: completed standing with wide BOS and eyes closed 2 x 30 seconds. Then completed eyes closed with horizontal/vertical head turns x 10 reps each direction. Then complted standing with eyes open and  reaching to targets in various directions. increased challenge with ones to L due to decreased lateral weight shift, completed x 15 reps. Marland Kitchen CGA required with completion. Completed forward toe taps with LLE x 15 reps to 4" step, intermittent asistance required to avoid circumduction with completion.      Exercises   Exercises Knee/Hip      Knee/Hip Exercises: Aerobic   Nustep Completed NuStep x 6 minutes with BLE at Level 3. PT providing Min A to keep LLE in neutral alignment at times, PT apllied theraband aroudn thighs to promote improved alignment. Verbal cues required to keep steps per minute > 30 due to inattention.                  PT Education - 11/14/20 1054    Education Details Educating on importance of wearing  AFO    Person(s) Educated Patient    Methods Explanation    Comprehension Verbalized understanding            PT Short Term Goals - 11/01/20 1207      PT SHORT TERM GOAL #1   Title = LTGs             PT Long Term Goals - 11/14/20 1831      PT LONG TERM GOAL #1   Title Patient will report independence with progressive HEP with caregiver asssistance, and report completing >/= 3x/weekly (ALL LTG's Due: 11/29/20)    Baseline reports independence, completing 1x/weekly    Time 4    Period Weeks    Status Revised      PT LONG TERM GOAL #2   Title Patient will demo ability to ambulate >250 ft with LRAD and CGA on various surfaces inlcuding outdoors to to demo improved ambulation within the community    Baseline 345' SBA with hemiwalker + L AFO (indoors)    Time 4    Period Weeks    Status Revised      PT LONG TERM GOAL #3   Title Patient will demo ability to complete TUG  < 50 seconds to demo improved ambulation and mobility    Baseline 1 min 32 secs; 54 secs    Time 4    Period Weeks    Status On-going      PT LONG TERM GOAL #4   Title Patient will improve gait speed to >/= 1.0 ft/sec to demonstrate improved mobility    Baseline 0.8 ft/sec on 09/17/20; 0.81 ft/sec with LBBQC on 11/01/20    Time 4    Period Weeks    Status Revised                 Plan - 11/14/20 1022    Clinical Impression Statement Continued today's session focused on strengthneing and improved weight shift onto LLE. Patient continues to ambulate with [facility] at this time. Continues to be challenged by balance activities promoting weight shift to LLE. Patient did not wear AFO today, PT educaitng on importnace and purpose of AFO. Will continue to benefit from skilled PT services to progress toward LTGs.    Personal Factors and Comorbidities Comorbidity 3+;Past/Current Experience;Time since onset of injury/illness/exacerbation;Transportation;Behavior Pattern    Comorbidities DM, HTN, hx of multiple  strokes in last 6 months    Examination-Activity Limitations Bathing;Bed Mobility;Caring for Others;Carry;Dressing;Hygiene/Grooming;Lift;Reach Overhead;Squat    Examination-Participation Restrictions Cleaning;Community Activity;Driving;Laundry;Yard Work;Occupation;Church;Meal Prep    Stability/Clinical Decision Making Evolving/Moderate complexity    Rehab Potential Good    PT Frequency 1x /  week    PT Duration 4 weeks    PT Treatment/Interventions ADLs/Self Care Home Management;Gait training;Stair training;Functional mobility training;Therapeutic activities;Therapeutic exercise;Balance training;Neuromuscular re-education;Manual techniques;Orthotic Fit/Training;Patient/family education;Cognitive remediation;Passive range of motion;Energy conservation;DME Instruction;Electrical Stimulation;Joint Manipulations    PT Next Visit Plan encourage hip/knee flexion activities to allow L foot to place properly and avoid circumduction, continue ambulation across unlevel surfaces, add to HEPas needed, may benefit from stepping tasks    Consulted and Agree with Plan of Care Patient;Family member/caregiver    Family Member Consulted Aunt           Patient will benefit from skilled therapeutic intervention in order to improve the following deficits and impairments:  Abnormal gait,Decreased activity tolerance,Decreased balance,Decreased cognition,Decreased coordination,Decreased endurance,Decreased knowledge of precautions,Decreased mobility,Decreased range of motion,Decreased safety awareness,Decreased strength,Difficulty walking,Impaired perceived functional ability,Impaired flexibility,Impaired sensation,Impaired tone,Impaired UE functional use,Postural dysfunction,Pain  Visit Diagnosis: Muscle weakness (generalized)  Unsteadiness on feet  Other abnormalities of gait and mobility     Problem List Patient Active Problem List   Diagnosis Date Noted  . Uncontrolled type 2 diabetes mellitus with  hyperglycemia, without long-term current use of insulin (HCC) 04/16/2020  . CVA (cerebral vascular accident) (HCC) 04/15/2020  . Cocaine use 04/15/2020  . Acute CVA (cerebrovascular accident) (HCC) 03/04/2020  . Stroke (cerebrum) (HCC) 03/03/2020  . Weakness   . Noncompliance with medication regimen   . TIA (transient ischemic attack) 01/22/2019  . Diabetes mellitus due to underlying condition without complications (HCC) 05/30/2014  . Essential hypertension, benign 05/30/2014  . Smoking 05/30/2014    Tempie Donning, PT, DPT 11/14/2020, 6:32 PM  [facility] [address] [place], [place], [address] Phone: [phone]   Fax:  [phone]  Name: Zyann Mabry MRN: [id number] Date of Birth: 12-May-1981

## 2020-11-22 ENCOUNTER — Other Ambulatory Visit: Payer: Self-pay

## 2020-11-22 ENCOUNTER — Ambulatory Visit: Payer: Medicaid Other | Admitting: Physical Therapy

## 2020-11-22 ENCOUNTER — Encounter: Payer: Self-pay | Admitting: Physical Therapy

## 2020-11-22 DIAGNOSIS — M6281 Muscle weakness (generalized): Secondary | ICD-10-CM | POA: Diagnosis not present

## 2020-11-22 DIAGNOSIS — R2689 Other abnormalities of gait and mobility: Secondary | ICD-10-CM

## 2020-11-22 DIAGNOSIS — R2681 Unsteadiness on feet: Secondary | ICD-10-CM

## 2020-11-23 NOTE — Therapy (Signed)
[facility] [address] [place], [place], [address] Phone: [phone]   Fax:  [phone]  Physical Therapy Treatment  Patient Details  Name: Ebony Matthews MRN: [id number] Date of Birth: 1981-09-02 Referring Provider (PT): Referred by: Georgiann Cocker, MD. Followed by Shon Millet, MD   Encounter Date: 11/22/2020   PT End of Session - 11/22/20 1024    Visit Number 42    Number of Visits 43    Date for PT Re-Evaluation --   Re-Eval due at 43rd visit   Authorization Type [employer] Medicaid    PT Start Time 1018    PT Stop Time 1100    PT Time Calculation (min) 42 min    Equipment Utilized During Treatment Gait belt;Other (comment)   pt's custom AFO   Activity Tolerance Patient tolerated treatment well    Behavior During Therapy WFL for tasks assessed/performed           Past Medical History:  Diagnosis Date  . Diabetes mellitus without complication (HCC)   . Hypertension   . TIA (transient ischemic attack) 01/22/2019    Past Surgical History:  Procedure Laterality Date  . NO PAST SURGERIES      There were no vitals filed for this visit.   Subjective Assessment - 11/22/20 1023    Subjective No new complaints. No falls or pain to report. Has brace on today.    Pertinent History hx of multiple CVA, DM, HTN, drug abuse    Limitations Standing;Walking;House hold activities;Lifting    How long can you sit comfortably? no issues    How long can you stand comfortably? <30 sec    How long can you walk comfortably? <10 feet with quad cane    Patient Stated Goals Get my balance better and walk on grass    Currently in Pain? No/denies    Pain Score 0-No pain                [facility] Adult PT Treatment/Exercise - 11/22/20 1025      Transfers   Transfers Sit to Stand;Stand to Sit    Sit to Stand 4: Min guard    Stand to Sit 5: Supervision      Ambulation/Gait   Ambulation/Gait Yes    Ambulation/Gait Assistance  4: Min guard    Ambulation/Gait Assistance Details around gym wtih session    Assistive device Large base quad cane;Other (Comment)   left AFO   Gait Pattern Decreased arm swing - left;Left circumduction    Ambulation Surface Level;Indoor      Neuro Re-ed    Neuro Re-ed Details  for balance/strengthening: at bottom of steps: right stance for left foot taps up/down bottom 2 steps with emphasis on hip/knee flexion and decreased circumdution. increased assistance needed as pt fatigued. then in left stance wtih emphasis on left knee control for right foot taps up/down bottom 2 steps with manual guarding on left knee, cues/facilitation to weight shift onto left LE. ended with left foot on bottom step for step ups with right marching/then back to floor for 10 reps with min assist. right UE support on rail with all activities done; seated at edge of mat: right foot on 2 inch box- sit<>stands for 10 reps with cues for full standing, weight shifting and controlled descent with right UE assit, then right foot on foam bubble for sit<>stands for 10 reps with cues as stated, min assist for balance/controlled descent.      Knee/Hip Exercises:  Aerobic   Other Aerobic Scifit level 3.0 with LE's only working on full range of motion with theraband assist to keep left LE in place for 8 minutes.                    PT Short Term Goals - 11/01/20 1207      PT SHORT TERM GOAL #1   Title = LTGs             PT Long Term Goals - 11/14/20 1831      PT LONG TERM GOAL #1   Title Patient will report independence with progressive HEP with caregiver asssistance, and report completing >/= 3x/weekly (ALL LTG's Due: 11/29/20)    Baseline reports independence, completing 1x/weekly    Time 4    Period Weeks    Status Revised      PT LONG TERM GOAL #2   Title Patient will demo ability to ambulate >250 ft with LRAD and CGA on various surfaces inlcuding outdoors to to demo improved ambulation within the community     Baseline 345' SBA with hemiwalker + L AFO (indoors)    Time 4    Period Weeks    Status Revised      PT LONG TERM GOAL #3   Title Patient will demo ability to complete TUG  < 50 seconds to demo improved ambulation and mobility    Baseline 1 min 32 secs; 54 secs    Time 4    Period Weeks    Status On-going      PT LONG TERM GOAL #4   Title Patient will improve gait speed to >/= 1.0 ft/sec to demonstrate improved mobility    Baseline 0.8 ft/sec on 09/17/20; 0.81 ft/sec with LBBQC on 11/01/20    Time 4    Period Weeks    Status Revised                 Plan - 11/22/20 1024    Clinical Impression Statement Today's skilled session continued to focus on strengthening, left LE weight bearing and left hip/knee flexion with no issues noted or reported in session. The pt should benefit from continued PT to progress toward LTGs.    Personal Factors and Comorbidities Comorbidity 3+;Past/Current Experience;Time since onset of injury/illness/exacerbation;Transportation;Behavior Pattern    Comorbidities DM, HTN, hx of multiple strokes in last 6 months    Examination-Activity Limitations Bathing;Bed Mobility;Caring for Others;Carry;Dressing;Hygiene/Grooming;Lift;Reach Overhead;Squat    Examination-Participation Restrictions Cleaning;Community Activity;Driving;Laundry;Yard Work;Occupation;Church;Meal Prep    Stability/Clinical Decision Making Evolving/Moderate complexity    Rehab Potential Good    PT Frequency 1x / week    PT Duration 4 weeks    PT Treatment/Interventions ADLs/Self Care Home Management;Gait training;Stair training;Functional mobility training;Therapeutic activities;Therapeutic exercise;Balance training;Neuromuscular re-education;Manual techniques;Orthotic Fit/Training;Patient/family education;Cognitive remediation;Passive range of motion;Energy conservation;DME Instruction;Electrical Stimulation;Joint Manipulations    PT Next Visit Plan check LTGs for anticipated discharge     Consulted and Agree with Plan of Care Patient;Family member/caregiver    Family Member Consulted Aunt           Patient will benefit from skilled therapeutic intervention in order to improve the following deficits and impairments:  Abnormal gait,Decreased activity tolerance,Decreased balance,Decreased cognition,Decreased coordination,Decreased endurance,Decreased knowledge of precautions,Decreased mobility,Decreased range of motion,Decreased safety awareness,Decreased strength,Difficulty walking,Impaired perceived functional ability,Impaired flexibility,Impaired sensation,Impaired tone,Impaired UE functional use,Postural dysfunction,Pain  Visit Diagnosis: Muscle weakness (generalized)  Unsteadiness on feet  Other abnormalities of gait and mobility     Problem List Patient Active Problem  List   Diagnosis Date Noted  . Uncontrolled type 2 diabetes mellitus with hyperglycemia, without long-term current use of insulin (HCC) 04/16/2020  . CVA (cerebral vascular accident) (HCC) 04/15/2020  . Cocaine use 04/15/2020  . Acute CVA (cerebrovascular accident) (HCC) 03/04/2020  . Stroke (cerebrum) (HCC) 03/03/2020  . Weakness   . Noncompliance with medication regimen   . TIA (transient ischemic attack) 01/22/2019  . Diabetes mellitus due to underlying condition without complications (HCC) 05/30/2014  . Essential hypertension, benign 05/30/2014  . Smoking 05/30/2014    Sallyanne Kuster, PTA, [facility] [address] [place], [place] [address] [phone] 11/23/20, 12:49 PM   Name: Cathlin Buchan MRN: [id number] Date of Birth: July 28, 1981

## 2020-11-29 ENCOUNTER — Ambulatory Visit: Payer: Medicaid Other | Attending: Internal Medicine

## 2020-11-29 ENCOUNTER — Other Ambulatory Visit: Payer: Self-pay

## 2020-11-29 DIAGNOSIS — R2681 Unsteadiness on feet: Secondary | ICD-10-CM | POA: Diagnosis present

## 2020-11-29 DIAGNOSIS — M6281 Muscle weakness (generalized): Secondary | ICD-10-CM | POA: Insufficient documentation

## 2020-11-29 DIAGNOSIS — R2689 Other abnormalities of gait and mobility: Secondary | ICD-10-CM | POA: Insufficient documentation

## 2020-11-29 NOTE — Therapy (Signed)
[facility] [address] [place], [place], [address] Phone: [phone]   Fax:  [phone]  Physical Therapy Treatment/Discharge Summary  Patient Details  Name: Ebony Matthews MRN: [id number] Date of Birth: May 08, 1981 Referring Provider (PT): Referred by: Jordan Hawks, MD. Followed by Metta Clines, MD  PHYSICAL THERAPY DISCHARGE SUMMARY  Visits from Start of Care: 33  Current functional level related to goals / functional outcomes: See Clinical Impression Statement for Details   Remaining deficits: Abnormal Gait, Decreased Strength   Education / Equipment: HEP Provided  Plan: Patient agrees to discharge.  Patient goals were partially met. Patient is being discharged due to meeting the stated rehab goals.  ?????       Encounter Date: 11/29/2020   PT End of Session - 11/29/20 1023    Visit Number 43    Number of Visits 43    Date for PT Re-Evaluation --   Re-Eval due at 43rd visit   Authorization Type [facility] Medicaid    PT Start Time 1018    PT Stop Time 1054    PT Time Calculation (min) 36 min    Equipment Utilized During Treatment Gait belt;Other (comment)   pt's custom AFO   Activity Tolerance Patient tolerated treatment well    Behavior During Therapy WFL for tasks assessed/performed           Past Medical History:  Diagnosis Date  . Diabetes mellitus without complication ([place])   . Hypertension   . TIA (transient ischemic attack) 01/22/2019    Past Surgical History:  Procedure Laterality Date  . NO PAST SURGERIES      There were no vitals filed for this visit.   Subjective Assessment - 11/29/20 1023    Subjective No changes/complaints since last visit. No falls or pain to report.    Pertinent History hx of multiple CVA, DM, HTN, drug abuse    Limitations Standing;Walking;House hold activities;Lifting    How long can you sit comfortably? no issues    How long can you stand comfortably? <30  sec    How long can you walk comfortably? <10 feet with quad cane    Patient Stated Goals Get my balance better and walk on grass    Currently in Pain? No/denies              [facility] Adult PT Treatment/Exercise - 11/29/20 0001      Transfers   Transfers Sit to Stand;Stand to Sit    Sit to Stand 5: Supervision    Sit to Stand Details (indicate cue type and reason) intermittent verbal cues for placement of LLE prior to standing    Stand to Sit 5: Supervision      Ambulation/Gait   Ambulation/Gait Yes    Ambulation/Gait Assistance 4: Min guard    Ambulation/Gait Assistance Details completed ambulation outdoors on unlevel surfaces with LBQC x 350 ft. Patient require intermittent supervision and CGA from PT. No fatigue noted. mild imbalance and slowed pace noted with incline/decline    Ambulation Distance (Feet) 350 Feet    Assistive device Large base quad cane;Other (Comment)   Personal AFO Donned   Gait Pattern Decreased arm swing - left;Left circumduction    Ambulation Surface Level;Indoor;Unlevel;Outdoor;Paved    Gait velocity 39.54 secs = 0.83 ft/sec      Timed Up and Go Test   TUG Normal TUG    Normal TUG (seconds) 50.97   w/ [facility]     Exercises   Exercises  Other Exercises    Other Exercises  Complete review of current HEP to ensure proper completion and PT educating on importance of compliance upon discharge.            Completed review of the following HEP to ensure proper completion/compliance upon D/C.    Access Code: [email or web address] URL: [email or web address] Date: 11/29/2020 Prepared by: Baldomero Lamy  Exercises Supine Heel Slide - 2 x daily - 5 x weekly - 1 sets - 10 reps Hooklying Single Leg Bent Knee Fallouts with Resistance - 1 x daily - 5 x weekly - 2 sets - 10 reps Supine March with Resistance Band - 1 x daily - 5 x weekly - 2 sets - 10 reps Clamshell with Resistance - 1 x daily - 5 x weekly - 2 sets - 10 reps Side to Side Weight Shift with  Counter Support - 1 x daily - 5 x weekly - 1 sets - 10 reps Staggered Stance Forward Backward Weight Shift with Counter Support - 1 x daily - 5 x weekly - 1 sets - 10 reps Supine Quad Set - 1 x daily - 5 x weekly - 1 sets - 10 reps - 3 seconds hold  Patient Education Falls at [place]       PT Education - 11/29/20 1023    Education Details Educated on progress toward LTG/ ready for D/C    Person(s) Educated Patient;Caregiver(s)    Methods Explanation    Comprehension Verbalized understanding            PT Short Term Goals - 11/01/20 1207      PT SHORT TERM GOAL #1   Title = LTGs             PT Long Term Goals - 11/29/20 1026      PT LONG TERM GOAL #1   Title Patient will report independence with progressive HEP with caregiver asssistance, and report completing >/= 3x/weekly (ALL LTG's Due: 11/29/20)    Baseline reports independence, completing 1x/weekly; completed 3-4x/week with indepenence and assistance from caregiver    Time 4    Period Weeks    Status Achieved      PT LONG TERM GOAL #2   Title Patient will demo ability to ambulate >250 ft with LRAD and CGA on various surfaces inlcuding outdoors to to demo improved ambulation within the community    Baseline 345' SBA with hemiwalker + L AFO (indoors); 350 ft on outdoor surfaces with CGA/Supervision with LBQC    Time 4    Period Weeks    Status Achieved      PT LONG TERM GOAL #3   Title Patient will demo ability to complete TUG  < 50 seconds to demo improved ambulation and mobility    Baseline 1 min 32 secs; 54 secs; 50.97 secs    Time 4    Period Weeks    Status Not Met      PT LONG TERM GOAL #4   Title Patient will improve gait speed to >/= 1.0 ft/sec to demonstrate improved mobility    Baseline 0.8 ft/sec on 09/17/20; 0.81 ft/sec with LBBQC on 11/01/20; 0.83 ft/sec with [facility]    Time 4    Period Weeks    Status Not Met                 Plan - 11/29/20 1103    Clinical Impression Statement  Completed assesment of patient's progress toward all LTGs. Patient able to  meet LTG 1 and 2, and made progress toward LTG 3 and 4. Patient dmeonstrating improved ambulation on unlevel outdoor surfaces. Patient demonstrating readiness for d/c from PT and break from services due to plateau in progress. PT educating on importnace of continued daily walking and HEP compliance to maintain gains achieved with PT services. patient has demosntrated improved balance, reduced fall risk, improved activity tolerance, and functional mobility with PT services.    Personal Factors and Comorbidities Comorbidity 3+;Past/Current Experience;Time since onset of injury/illness/exacerbation;Transportation;Behavior Pattern    Comorbidities DM, HTN, hx of multiple strokes in last 6 months    Examination-Activity Limitations Bathing;Bed Mobility;Caring for Others;Carry;Dressing;Hygiene/Grooming;Lift;Reach Overhead;Squat    Examination-Participation Restrictions Cleaning;Community Activity;Driving;Laundry;Yard Work;Occupation;Church;Meal Prep    Stability/Clinical Decision Making Evolving/Moderate complexity    Rehab Potential Good    PT Frequency 1x / week    PT Duration 4 weeks    PT Treatment/Interventions ADLs/Self Care Home Management;Gait training;Stair training;Functional mobility training;Therapeutic activities;Therapeutic exercise;Balance training;Neuromuscular re-education;Manual techniques;Orthotic Fit/Training;Patient/family education;Cognitive remediation;Passive range of motion;Energy conservation;DME Instruction;Electrical Stimulation;Joint Manipulations    PT Next Visit Plan --    Consulted and Agree with Plan of Care Patient;Family member/caregiver    Family Member Consulted Aunt           Patient will benefit from skilled therapeutic intervention in order to improve the following deficits and impairments:  Abnormal gait,Decreased activity tolerance,Decreased balance,Decreased cognition,Decreased  coordination,Decreased endurance,Decreased knowledge of precautions,Decreased mobility,Decreased range of motion,Decreased safety awareness,Decreased strength,Difficulty walking,Impaired perceived functional ability,Impaired flexibility,Impaired sensation,Impaired tone,Impaired UE functional use,Postural dysfunction,Pain  Visit Diagnosis: Muscle weakness (generalized)  Unsteadiness on feet  Other abnormalities of gait and mobility     Problem List Patient Active Problem List   Diagnosis Date Noted  . Uncontrolled type 2 diabetes mellitus with hyperglycemia, without long-term current use of insulin ([place]) 04/16/2020  . CVA (cerebral vascular accident) ([place]) 04/15/2020  . Cocaine use 04/15/2020  . Acute CVA (cerebrovascular accident) ([place]) 03/04/2020  . Stroke (cerebrum) ([place]) 03/03/2020  . Weakness   . Noncompliance with medication regimen   . TIA (transient ischemic attack) 01/22/2019  . Diabetes mellitus due to underlying condition without complications ([place]) 84/13/2440  . Essential hypertension, benign 05/30/2014  . Smoking 05/30/2014    Jones Bales, PT, DPT 11/29/2020, 11:08 AM  [facility] [address] [place], [place], [address] Phone: [phone]   Fax:  [phone]  Name: Kieren Ricci MRN: [id number] Date of Birth: July 07, 1981

## 2020-11-30 ENCOUNTER — Ambulatory Visit: Payer: Medicaid Other | Admitting: Cardiology

## 2020-11-30 ENCOUNTER — Encounter: Payer: Self-pay | Admitting: Cardiology

## 2020-11-30 ENCOUNTER — Telehealth: Payer: Self-pay

## 2020-11-30 VITALS — BP 139/87 | HR 103 | Temp 98.5°F | Resp 16 | Ht 67.0 in | Wt 194.0 lb

## 2020-11-30 DIAGNOSIS — R9431 Abnormal electrocardiogram [ECG] [EKG]: Secondary | ICD-10-CM

## 2020-11-30 DIAGNOSIS — F1411 Cocaine abuse, in remission: Secondary | ICD-10-CM

## 2020-11-30 DIAGNOSIS — I10 Essential (primary) hypertension: Secondary | ICD-10-CM

## 2020-11-30 DIAGNOSIS — Z87891 Personal history of nicotine dependence: Secondary | ICD-10-CM

## 2020-11-30 DIAGNOSIS — R079 Chest pain, unspecified: Secondary | ICD-10-CM

## 2020-11-30 DIAGNOSIS — Z8673 Personal history of transient ischemic attack (TIA), and cerebral infarction without residual deficits: Secondary | ICD-10-CM

## 2020-11-30 DIAGNOSIS — E1165 Type 2 diabetes mellitus with hyperglycemia: Secondary | ICD-10-CM

## 2020-11-30 DIAGNOSIS — I63231 Cerebral infarction due to unspecified occlusion or stenosis of right carotid arteries: Secondary | ICD-10-CM

## 2020-11-30 DIAGNOSIS — E7800 Pure hypercholesterolemia, unspecified: Secondary | ICD-10-CM

## 2020-11-30 NOTE — Telephone Encounter (Signed)
Patient scheduled for Loop recorder placement on 12/14/2020 @ 8am

## 2020-11-30 NOTE — Progress Notes (Signed)
Date:  11/30/2020   ID:  Ebony Matthews, DOB October 04, 1980, MRN [id number]  PCP:  Jordan Hawks, NP  Cardiologist:  Rex Kras, DO, [facility] (established care 11/30/2020) Cardiology EP: Dr. Cristopher Peru.   REASON FOR CONSULT: CVA due to occlusion of right carotid artery and Hypertension  REQUESTING PHYSICIAN:  Jordan Hawks, [facility] [place],  [place] [address]  Chief Complaint  Patient presents with  . Cerebrovascular accident (CVA) due to occlusion of right ca  . Hypertension  . New Patient (Initial Visit)    HPI  Ebony Matthews is a 40 y.o. female who presents to the office with a chief complaint of " loop recorder implantation." Patient's past medical history and cardiovascular risk factors include: History of multiple strokes with residual deficits, right ICA occlusion, hypertension, diabetes, history of cocaine use, former smoker.  She is referred to the office at the request of Jordan Hawks, NP for evaluation of loop recorder implantation.  She  is accompanied by [name] at today's office visit.  She provides verbal consent in regards to discussing her medical information in their presence.  Patient is not the best historian and therefore history of present illness is obtained after discussing the case with the patient and her aunt as well as reviewing electronic medical records.  It appears that the patient has had 2 strokes in the recent past and now has residual deficits as a result.  Initial stroke was considered to be cryptogenic however the second stroke that happened in July 2021 was most likely secondary to right ICA occlusion.  In the past patient has been evaluated by Dr. Crissie Sickles for loop recorder implantation but was not followed through.   Echocardiogram was performed back in April 2022 which noted no significant flow by color Doppler via the interatrial septum and bubble study was negative.  As per EMR patient has not undergone a TEE  as her strokes have been during the COVID-19 pandemic, she was also using cocaine, noncompliant with medical therapy despite multiple cardiovascular comorbidities, and noted to have poor insight in her medical health.  However, patient has been following up with her neurologist on a regular basis.  She is working on lifestyle modifications and to improve her cardiovascular risk factors as well.  Patient states that as of November 2022 she is no longer using cocaine or smoking cigarettes.  Recently she also underwent a 14-day monitor to evaluate for atrial fibrillation burden given her history.  Based on recent monitor no atrial fibrillation was detected and therefore the shared decision was to possibly reconsider loop recorder implantation and patient is referred to [place] by neurology for further recommendations.   FUNCTIONAL STATUS: Limited as she ambulates with either a cane or walker after her strokes.  ALLERGIES: No Known Allergies  MEDICATION LIST PRIOR TO VISIT: Current Meds  Medication Sig  . atorvastatin (LIPITOR) 40 MG tablet Take 40 mg by mouth at bedtime.  . clopidogrel (PLAVIX) 75 MG tablet Take 1 tablet (75 mg total) by mouth daily.  . Empagliflozin (JARDIANCE PO) Take 10 mg by mouth at bedtime.  . metFORMIN (GLUCOPHAGE) 1000 MG tablet Take 1 tablet (1,000 mg total) by mouth 2 (two) times daily.  . valsartan-hydrochlorothiazide (DIOVAN-HCT) 80-12.5 MG tablet Take 1 tablet by mouth daily.  . [DISCONTINUED] lisinopril (ZESTRIL) 20 MG tablet Take 1 tablet (20 mg total) by mouth daily.     PAST MEDICAL HISTORY: Past Medical History:  Diagnosis Date  .  Diabetes mellitus without complication ([place])   . Hyperlipidemia   . Hypertension   . Stroke ([place])   . TIA (transient ischemic attack) 01/22/2019    PAST SURGICAL HISTORY: Past Surgical History:  Procedure Laterality Date  . NO PAST SURGERIES      FAMILY HISTORY: The patient family history includes Diabetes in an other  family member; Hypertension in her maternal grandmother.  SOCIAL HISTORY:  The patient  reports that she quit smoking about 14 months ago. Her smoking use included cigarettes. She has a 15.00 pack-year smoking history. She has never used smokeless tobacco. She reports previous drug use. Drugs: Marijuana and Cocaine. She reports that she does not drink alcohol.  REVIEW OF SYSTEMS: Review of Systems  Constitutional: Negative for chills and fever.  HENT: Negative for hoarse voice and nosebleeds.   Eyes: Negative for discharge, double vision and pain.  Cardiovascular: Negative for chest pain, claudication, dyspnea on exertion, leg swelling, near-syncope, orthopnea, palpitations, paroxysmal nocturnal dyspnea and syncope.  Respiratory: Negative for hemoptysis and shortness of breath.   Musculoskeletal: Negative for muscle cramps and myalgias.  Gastrointestinal: Negative for abdominal pain, constipation, diarrhea, hematemesis, hematochezia, melena, nausea and vomiting.  Neurological: Positive for focal weakness (LUE weakness and LLE weakness (wears a brace). ). Negative for dizziness and light-headedness.    PHYSICAL EXAM: Vitals with BMI 11/30/2020 09/25/2020 07/17/2020  Height '5\' 7"'  '5\' 7"'  '5\' 7"'   Weight 194 lbs 194 lbs 10 oz 188 lbs  BMI 30.38 16.38 46.65  Systolic 993 570 -  Diastolic 87 75 -  Pulse 177 95 -    CONSTITUTIONAL: Appears older than stated age, hemodynamically stable, no acute distress.   SKIN: Skin is warm and dry. No rash noted. No cyanosis. No pallor. No jaundice HEAD: Normocephalic and atraumatic.  EYES: No scleral icterus MOUTH/THROAT: Moist oral membranes.  NECK: No JVD present. No thyromegaly noted. No carotid bruits  LYMPHATIC: No visible cervical adenopathy.  CHEST Normal respiratory effort. No intercostal retractions  LUNGS: Clear to auscultation bilaterally. No stridor. No wheezes. No rales.  CARDIOVASCULAR: Regular, tachycardic, positive S1-S2, no murmurs rubs or  gallops appreciated. ABDOMINAL: Soft, nontender, nondistended, positive bowel sounds in all 4 quadrants, no apparent ascites.  EXTREMITIES: No peripheral edema, brace present over the left lower extremity.  HEMATOLOGIC: No significant bruising NEUROLOGIC: Oriented to person, place, and time.  Strength over the left upper and lower extremity 3+ and preserved involving the right upper and lower extremity PSYCHIATRIC: Normal mood and affect. Normal behavior. Cooperative  Radiology/Studies:   Ct Angio Head W Or Wo Contrast Result Date: 01/22/2019 CLINICAL DATA:  Right facial droop, slurred speech, right arm numbness. Diabetes and hypertension EXAM: CT ANGIOGRAPHY HEAD AND NECK TECHNIQUE: Multidetector CT imaging of the head and neck was performed using the standard protocol during bolus administration of intravenous contrast. Multiplanar CT image reconstructions and MIPs were obtained to evaluate the vascular anatomy. Carotid stenosis measurements (when applicable) are obtained utilizing NASCET criteria, using the distal internal carotid diameter as the denominator. CONTRAST:  26m OMNIPAQUE IOHEXOL 350 MG/ML SOLN COMPARISON:  None. FINDINGS: CT HEAD FINDINGS Brain: No evidence of acute infarction, hemorrhage, hydrocephalus, extra-axial collection or mass lesion/mass effect. Vascular: Negative for hyperdense vessel Skull: Negative Sinuses: Negative Orbits: Negative Review of the MIP images confirms the above findings CTA NECK FINDINGS Aortic arch: Standard branching. Imaged portion shows no evidence of aneurysm or dissection. No significant stenosis of the major arch vessel origins. Right carotid system: Right carotid bifurcation widely patent.  Mild fusiform dilatation of the distal right internal carotid artery below the skull base with mild irregularity, question mild FMD. No significant stenosis Left carotid system: Similar findings to the right carotid with mild fusiform dilatation of the internal carotid  artery below the skull base. Mild narrowing at the origin of the left internal carotid artery. No calcification or aneurysm. No significant stenosis. Vertebral arteries: Both vertebral arteries widely patent to the basilar without stenosis. Skeleton: No acute skeletal abnormality. Poor dentition with numerous caries. Other neck: Negative for soft tissue mass or adenopathy. Upper chest: Negative Review of the MIP images confirms the above findings CTA HEAD FINDINGS Anterior circulation: Mild narrowing of the right cavernous carotid compared to the right internal carotid artery below the skull base. Right anterior and middle cerebral arteries widely patent Diffuse narrowing left cavernous carotid with mild-to-moderate focal stenosis proximal cavernous carotid. Left anterior and middle cerebral arteries widely patent. Posterior circulation: Both vertebral arteries patent to the basilar. PICA patent bilaterally. Basilar widely patent. Superior cerebellar and posterior cerebral arteries widely patent. Venous sinuses: Patent Anatomic variants: None Delayed phase: Normal enhancement postcontrast administration. Review of the MIP images confirms the above findings IMPRESSION: 1. Negative CT head 2. Negative for emergent large vessel occlusion 3. Mild fusiform dilatation of the internal carotid artery below the skull base with subsequent narrowing to the cavernous carotid bilaterally. Question FMD. No intracranial aneurysm. 4. These results were called by telephone at the time of interpretation on 01/22/2019 at 9:36 pm to Dr. Kerney Elbe , who verbally acknowledged these results. Electronically Signed   By: Franchot Gallo M.D.   On: 01/22/2019 21:36   Dg Chest 2 View Result Date: 01/23/2019 CLINICAL DATA:  Dizziness EXAM: CHEST - 2 VIEW COMPARISON:  07/29/2016 FINDINGS: The heart size and mediastinal contours are within normal limits. Both lungs are clear. The visualized skeletal structures are unremarkable. IMPRESSION:  No active cardiopulmonary disease. Electronically Signed   By: Ulyses Jarred M.D.   On: 01/23/2019 02:35   Mr Brain Wo Contrast Result Date: 01/23/2019 CLINICAL DATA:  Near syncopal event with slurred speech and right upper extremity numbness. Right facial droop. Symptoms resolved after 5 minutes. EXAM: MRI HEAD WITHOUT CONTRAST TECHNIQUE: Multiplanar, multiecho pulse sequences of the brain and surrounding structures were obtained without intravenous contrast. COMPARISON:  CTA head neck 01/22/2019 FINDINGS: BRAIN: There is multifocal acute ischemia, mostly within the left hemisphere, with punctate foci along the left precentral gyrus and within the posterior left parietal lobe. There is a single focus in the right hemispheric white matter and a small linear focus in the left occipital lobe. The midline structures are normal. No midline shift or other mass effect. Multifocal white matter hyperintensity, most commonly due to chronic ischemic microangiopathy. The cerebral and cerebellar volume are age-appropriate. No hydrocephalus. Susceptibility-sensitive sequences show no chronic microhemorrhage or superficial siderosis. No mass lesion. VASCULAR: The major intracranial arterial and venous sinus flow voids are normal. SKULL AND UPPER CERVICAL SPINE: Calvarial bone marrow signal is normal. There is no skull base mass. Visualized upper cervical spine and soft tissues are normal. SINUSES/ORBITS: No fluid levels or advanced mucosal thickening. No mastoid or middle ear effusion. The orbits are normal. IMPRESSION: 1. Multifocal acute ischemia, predominantly in the left hemisphere, although there is a single focus within the right frontal white matter. Punctate ischemic foci along the left motor strip are compatible with reported right upper extremity symptoms. 2. No hemorrhage or mass effect. 3. Mild findings of chronic ischemic microangiopathy.  Electronically Signed   By: Ulyses Jarred M.D.   On: 01/23/2019 02:35    MRI brain without contrast 03/03/2020: 1. Scattered multifocal subcentimeter acute ischemic infarcts involving the right cerebral hemisphere as above. Few additional subcentimeter infarcts involve the left occipital lobe and inferior right cerebellum. No associated hemorrhage or mass effect. 2. Small chronic infarct involving the right middle cerebellar peduncle, chronic in appearance, but new as compared to previous. 3. Underlying mild to moderate chronic microvascular ischemic disease, mildly progressed from previous.   CTA neck with and without contrast: 04/15/2020. 1. Acute proximal right ICA occlusion, new from previous. Distal reconstitution at the supraclinoid segment via collateral flow across the circle-of-Willis. Right MCA is perfused although attenuated as compared to the contralateral left. 2. Otherwise stable CTA of the head and neck. No other large vessel occlusion, hemodynamically significant stenosis, or other acute vascular abnormality. 3. 4 mm left upper lobe nodule, indeterminate. No follow-up needed if patient is low-risk. Non-contrast chest CT can be considered in 12 months if patient is high-risk. This recommendation follows the consensus statement: Guidelines for Management of Incidental Pulmonary Nodules Detected on CT Images: From the Fleischner Society 2017; Radiology 2017; 284:228-243.  MRI brain with and without contrast 04/15/2020: Multiple new acute infarcts in the right cerebral hemisphere. Small acute infarct of the right pons.  CARDIAC DATABASE: EKG: 11/30/2020: Sinus  Tachycardia, 101bpm, consider old anteroseptal infarct, without underlying injury pattern.   Echocardiogram: 03/04/2020: 1. Left ventricular ejection fraction, by estimation, is 60 to 65%. The  left ventricle has normal function. The left ventricle has no regional  wall motion abnormalities. There is mild left ventricular hypertrophy.  Left ventricular diastolic parameters  are consistent with  Grade I diastolic dysfunction (impaired relaxation).  2. Right ventricular systolic function is normal. The right ventricular  size is normal.  3. The mitral valve is normal in structure. No evidence of mitral valve  regurgitation. No evidence of mitral stenosis.  4. The aortic valve is normal in structure. Aortic valve regurgitation is  not visualized. No aortic stenosis is present.  5. The inferior vena cava is normal in size with greater than 50%  respiratory variability, suggesting right atrial pressure of 3 mmHg.   Conclusion(s)/Recommendation(s): No intracardiac source of embolism  detected on this transthoracic study. A transesophageal echocardiogram is  recommended to exclude cardiac source of embolism if clinically indicated.   Stress Testing: No results found for this or any previous visit from the past 1095 days.  Heart Catheterization: None   Zio Patch Extended out patient EKG monitoring 14 days starting 10/10/2020: Predominant rhythm is normal sinus rhythm.  Minimum heart rate 67 and maximum heart rate 160 bpm with an average heart rate of 98 bpm. There were occasional PACs and atrial couplets.  There were rare PVCs.  There was no atrial fibrillation. There was no heart block, no symptomatic events recorded   LABORATORY DATA: CBC Latest Ref Rng & Units 04/23/2020 04/15/2020 03/03/2020  WBC 4.0 - 10.5 K/uL 7.5 8.6 -  Hemoglobin 12.0 - 15.0 g/dL 13.9 14.1 15.0  Hematocrit 36.0 - 46.0 % 43.7 43.2 44.0  Platelets 150 - 400 K/uL 199 214 -    CMP Latest Ref Rng & Units 04/23/2020 04/15/2020 03/03/2020  Glucose 70 - 99 mg/dL 200(H) 378(H) 319(H)  BUN 6 - 20 mg/dL '7 7 11  ' Creatinine 0.44 - 1.00 mg/dL 0.81 0.89 0.70  Sodium 135 - 145 mmol/L 133(L) 135 135  Potassium 3.5 - 5.1 mmol/L 3.9  3.7 4.3  Chloride 98 - 111 mmol/L 101 100 100  CO2 22 - 32 mmol/L 21(L) 22 -  Calcium 8.9 - 10.3 mg/dL 9.5 9.7 -  Total Protein 6.5 - 8.1 g/dL 7.1 8.2(H) -  Total Bilirubin 0.3 - 1.2 mg/dL  0.7 0.8 -  Alkaline Phos 38 - 126 U/L 74 117 -  AST 15 - 41 U/L 23 24 -  ALT 0 - 44 U/L 21 20 -    Lipid Panel     Component Value Date/Time   CHOL 258 (H) 03/04/2020 0619   TRIG 133 03/04/2020 0619   HDL 29 (L) 03/04/2020 0619   CHOLHDL 8.9 03/04/2020 0619   VLDL 27 03/04/2020 0619   LDLCALC 202 (H) 03/04/2020 0619    No components found for: NTPROBNP No results for input(s): PROBNP in the last 8760 hours. No results for input(s): TSH in the last 8760 hours.  BMP Recent Labs    03/03/20 1232 03/03/20 1238 04/15/20 1652 04/23/20 1125  NA 134* 135 135 133*  K 4.6 4.3 3.7 3.9  CL 99 100 100 101  CO2 20*  --  22 21*  GLUCOSE 316* 319* 378* 200*  BUN '8 11 7 7  ' CREATININE 0.85 0.70 0.89 0.81  CALCIUM 9.3  --  9.7 9.5  GFRNONAA >60  --  >60 >60  GFRAA >60  --  >60 >60    HEMOGLOBIN A1C Lab Results  Component Value Date   HGBA1C 10.7 (H) 03/04/2020   MPG 260.39 03/04/2020    External Labs: Collected: 11/28/2020 provided by PCP during the office visit Creatinine 0.81 mg/dL. eGFR: 105 mL/min per 1.73 m Lipid profile: Total cholesterol 122, triglycerides 91, HDL 34, LDL 71, non-HDL 88 Sodium 137, potassium 4.6, chloride 103, bicarbonate 24, AST 18, ALT 35, alkaline phosphatase 76 Hemoglobin 12.3 g/dL, hematocrit 38.9% Hemoglobin A1c: 8  IMPRESSION:    ICD-10-CM   1. Hx of bilateral embolic infarcts  E52.77 Ambulatory referral to Vascular Surgery  2. Hx of right proximal ICA occlusion  I63.231 Ambulatory referral to Vascular Surgery  3. Type 2 diabetes mellitus with hyperglycemia, without long-term current use of insulin (HCC)  E11.65 PCV MYOCARDIAL PERFUSION WITH LEXISCAN  4. Hypercholesteremia  E78.00   5. Essential hypertension, benign  I10 EKG 12-Lead  6. Former smoker  Z87.891   15. Nonspecific abnormal electrocardiogram (ECG) (EKG)  R94.31 PCV MYOCARDIAL PERFUSION WITH LEXISCAN  8. Chest pain, unspecified type  R07.9 PCV MYOCARDIAL PERFUSION WITH LEXISCAN   9. Hx of cocaine abuse ([place])  F14.11      RECOMMENDATIONS: Ebony Matthews is a 40 y.o. female whose past medical history and cardiac risk factors include: Cryptogenic stroke w/ left-sided deficits, diabetes mellitus, hypertension, back pain, smoker, marijuana and cocaine use.  History of multiple strokes: The patient has at least 2 strokes in the past in addition to a TIA in the recent years.  She has been closely following up with her neurologist as recommended. Patient is a poor historian with regards to providing a chronological order of her strokes in the past therefore, majority of the history was obtained as result of reviewing her chart and the radiology reports. She has had an echocardiogram in the past which noted preserved LVEF and bubble study was negative for interatrial shunting. Has not undergone a transesophageal echocardiogram to rule out PFO for the reasons mentioned above. In the past she was informed/consented for loop recorder implantation but it did not occur. Since then she recently  underwent monitor which did not illustrate the presence of atrial fibrillation. However given the multiple strokes in the past she is referred to the office for loop recorder implantation.  Reviewed the risks, benefits, and alternatives to implantable loop recorder with the patient and her aunt Ebony Matthews at today's office visit.  Patient verbalizes understanding and wishes to proceed with implantation of loop recorder.  Per patient, she has stopped all use of cigarette smoking as well as cocaine since November 2021. Her hemoglobin A1c is also improved. Patient states that she is more serious with regards to improving her modifiable cardiovascular risk factors for secondary prevention of stroke.   She also has a history of miscarriage in the past and given her history of strokes would benefit from hypercoagulable work-up.  Reviewed electronic medical records notes that she had some work-up for this  in the past but not sure if this is complete.  I have asked her to discuss this further with her PCP and consider hematology referral for hypercoagulable work-up.  History of right ICA occlusion status post stroke: Currently on Plavix and statin therapy. We will refer to vascular surgery for longitudinal care given her history of carotid disease.  On review of systems patient complains of precordial discomfort which appears to be noncardiac in etiology; however, given her multiple cardiovascular risk factors including stroke x2, TIA, diabetes mellitus, hyperlipidemia, and hypertension the shared decision was to proceed with an ischemic evaluation.  We will order a Lexiscan to evaluate for reversible ischemia.  Of note, patient is not an exercise candidate given her history of residual deficit status post stroke.  Review of medication reconciliation notes that she is taking both Diovan/HCTZ and lisinopril.  I have asked her to hold lisinopril as she should not be on both ACE inhibitors and ARB at the same time.  Also obtain labs from her PCP during today's office visit I independently reviewed the findings noted above for further reference.  Total encounter time 65 minutes. *Total Encounter Time as defined by the Centers for Medicare and Medicaid Services includes, in addition to the face-to-face time of a patient visit (documented in the note above) non-face-to-face time: obtaining and reviewing outside history, ordering and reviewing medications, tests or procedures, care coordination (communications with other health care professionals or caregivers) and documentation in the medical record.   FINAL MEDICATION LIST END OF ENCOUNTER: No orders of the defined types were placed in this encounter.    Current Outpatient Medications:  .  atorvastatin (LIPITOR) 40 MG tablet, Take 40 mg by mouth at bedtime., Disp: , Rfl:  .  clopidogrel (PLAVIX) 75 MG tablet, Take 1 tablet (75 mg total) by mouth  daily., Disp: 30 tablet, Rfl: 0 .  Empagliflozin (JARDIANCE PO), Take 10 mg by mouth at bedtime., Disp: , Rfl:  .  metFORMIN (GLUCOPHAGE) 1000 MG tablet, Take 1 tablet (1,000 mg total) by mouth 2 (two) times daily., Disp: 60 tablet, Rfl: 0 .  valsartan-hydrochlorothiazide (DIOVAN-HCT) 80-12.5 MG tablet, Take 1 tablet by mouth daily., Disp: , Rfl:   Orders Placed This Encounter  Procedures  . Ambulatory referral to Vascular Surgery  . PCV MYOCARDIAL PERFUSION WITH LEXISCAN  . EKG 12-Lead    There are no Patient Instructions on file for this visit.   --Continue cardiac medications as reconciled in final medication list. --Return in about 4 weeks (around 12/28/2020) for Follow up: s/p loop recorder, review stress test. Or sooner if needed. --Continue follow-up with your primary care physician regarding  the management of your other chronic comorbid conditions.  Patient's questions and concerns were addressed to her satisfaction. She voices understanding of the instructions provided during this encounter.   This note was created using a voice recognition software as a result there may be grammatical errors inadvertently enclosed that do not reflect the nature of this encounter. Every attempt is made to correct such errors.  Rex Kras, [place], [facility]  Pager: [phone] Office: [phone]

## 2020-11-30 NOTE — Telephone Encounter (Signed)
Make sure I am free that time

## 2020-12-02 NOTE — Telephone Encounter (Signed)
Thanks

## 2020-12-03 NOTE — Telephone Encounter (Signed)
It should be fine, asked patient to come 10 minutes early so I will be here on time.  Which day is this so I will place it on my calendar.

## 2020-12-03 NOTE — Telephone Encounter (Signed)
I have blocked your schedule stating at @ 8am. You have an 8:30am appt. Should I move this appt?

## 2020-12-05 ENCOUNTER — Other Ambulatory Visit: Payer: Self-pay

## 2020-12-05 ENCOUNTER — Ambulatory Visit: Payer: Medicaid Other

## 2020-12-10 ENCOUNTER — Other Ambulatory Visit: Payer: Self-pay

## 2020-12-10 ENCOUNTER — Ambulatory Visit: Payer: Medicaid Other

## 2020-12-14 ENCOUNTER — Ambulatory Visit: Payer: Medicaid Other | Admitting: Cardiology

## 2020-12-14 ENCOUNTER — Other Ambulatory Visit: Payer: Self-pay

## 2020-12-14 VITALS — BP 107/72 | HR 111 | Temp 97.6°F | Resp 16 | Ht 67.0 in | Wt 194.0 lb

## 2020-12-14 DIAGNOSIS — Z95818 Presence of other cardiac implants and grafts: Secondary | ICD-10-CM

## 2020-12-14 DIAGNOSIS — Z8673 Personal history of transient ischemic attack (TIA), and cerebral infarction without residual deficits: Secondary | ICD-10-CM

## 2020-12-14 HISTORY — DX: Presence of other cardiac implants and grafts: Z95.818

## 2020-12-19 NOTE — Progress Notes (Signed)
Called patient, she will be here at her scheduled appointment.

## 2020-12-21 ENCOUNTER — Other Ambulatory Visit: Payer: Self-pay

## 2020-12-21 ENCOUNTER — Ambulatory Visit: Payer: Medicaid Other | Admitting: Cardiology

## 2020-12-21 ENCOUNTER — Encounter: Payer: Self-pay | Admitting: Cardiology

## 2020-12-21 VITALS — BP 114/67 | HR 107 | Temp 97.2°F | Resp 17 | Ht 67.0 in | Wt 192.0 lb

## 2020-12-21 DIAGNOSIS — I10 Essential (primary) hypertension: Secondary | ICD-10-CM

## 2020-12-21 DIAGNOSIS — E7800 Pure hypercholesterolemia, unspecified: Secondary | ICD-10-CM

## 2020-12-21 DIAGNOSIS — E1165 Type 2 diabetes mellitus with hyperglycemia: Secondary | ICD-10-CM

## 2020-12-21 DIAGNOSIS — Z8673 Personal history of transient ischemic attack (TIA), and cerebral infarction without residual deficits: Secondary | ICD-10-CM

## 2020-12-21 DIAGNOSIS — R072 Precordial pain: Secondary | ICD-10-CM

## 2020-12-21 DIAGNOSIS — R9439 Abnormal result of other cardiovascular function study: Secondary | ICD-10-CM

## 2020-12-21 DIAGNOSIS — F1411 Cocaine abuse, in remission: Secondary | ICD-10-CM

## 2020-12-21 DIAGNOSIS — I63231 Cerebral infarction due to unspecified occlusion or stenosis of right carotid arteries: Secondary | ICD-10-CM

## 2020-12-21 DIAGNOSIS — Z95818 Presence of other cardiac implants and grafts: Secondary | ICD-10-CM

## 2020-12-21 DIAGNOSIS — Z87891 Personal history of nicotine dependence: Secondary | ICD-10-CM

## 2020-12-21 MED ORDER — NITROGLYCERIN 0.4 MG SL SUBL: 0.4000 mg | SUBLINGUAL_TABLET | SUBLINGUAL | 0 refills | Status: DC | PRN

## 2020-12-21 MED ORDER — METOPROLOL SUCCINATE ER 25 MG PO TB24: 25.0000 mg | ORAL_TABLET | Freq: Every day | ORAL | 2 refills | Status: DC

## 2020-12-21 NOTE — Progress Notes (Signed)
ID:  Ebony Matthews, DOB 26-May-1981, MRN [id number]  PCP:  Jordan Hawks, NP  Cardiologist:  Rex Kras, DO, [facility] (established care 11/30/2020) Cardiology EP: Dr. Cristopher Peru.   Date: 12/21/20 Last Office Visit: 12/14/2020  Chief Complaint  Patient presents with  . Follow-up    Review test results and 1 week follow-up after loop recorder implantation    HPI  Ebony Matthews is a 40 y.o. female who presents to the office with a chief complaint of " review test results and 1 week follow-up after loop recorder implantation." Patient's past medical history and cardiovascular risk factors include: History of multiple strokes with residual deficits, right ICA occlusion, hypertension, diabetes, history of cocaine use, former smoker.  She is referred to the office at the request of Jordan Hawks, NP for evaluation of loop recorder implantation.  She  is accompanied by [name] at today's office visit.  She provides verbal consent in regards to discussing her medical information in their presence.  Patient has a known history of 2 strokes in the recent past with residual deficits.  Based on electronic medical records initial stroke was considered to be cryptogenic etiology however the second stroke that happened in July 2021 was most likely secondary to right ICA occlusion.  In the past patient has been evaluated by cardiac electrophysiologist Dr. Crissie Sickles for loop recorder implantation the patient had failed to follow-up.  Surface echocardiograms in the past were negative for PFO.  In the past transesophageal echocardiogram was also recommended.  However a TEE was not performed and as per electronic medical records it was due to the ongoing COVID-19 pandemic, her recent use of cocaine, and noncompliance with medical therapy due to multiple cardiovascular comorbidities, findings suggestive of poor insight and medical health.  Since then patient has reestablished care with with  neurology and underwent a 14-day monitor to evaluate for atrial fibrillation burden.  However based on the most recent monitor results there is no detection of atrial fibrillation during the monitoring period.  And therefore she was referred to cardiology for loop recorder implantation.  Since last office visit patient did undergo loop recorder implantation and is here for 1 week follow-up.  Patient states that the site is healing well, no fevers chills or drainage noted at that site.  Since last office visit patient has also undergone a stress test results were reviewed with her in great detail at today's office visit and noted below for further reference.  Patient denies any chest pain or anginal equivalent.   FUNCTIONAL STATUS: Limited as she ambulates with either a cane or walker after her strokes.  ALLERGIES: No Known Allergies  MEDICATION LIST PRIOR TO VISIT: Current Meds  Medication Sig  . atorvastatin (LIPITOR) 40 MG tablet Take 40 mg by mouth at bedtime.  . clopidogrel (PLAVIX) 75 MG tablet Take 1 tablet (75 mg total) by mouth daily.  . Empagliflozin (JARDIANCE PO) Take 10 mg by mouth at bedtime.  . metFORMIN (GLUCOPHAGE) 1000 MG tablet Take 1 tablet (1,000 mg total) by mouth 2 (two) times daily.  . metoprolol succinate (TOPROL XL) 25 MG 24 hr tablet Take 1 tablet (25 mg total) by mouth daily. Hold if top blood pressure number less than 100 mmHg or heart rate less than 60 bpm (pulse).  . nitroGLYCERIN (NITROSTAT) 0.4 MG SL tablet Place 1 tablet (0.4 mg total) under the tongue every 5 (five) minutes as needed for chest pain. If you require more than two tablets  five minutes apart go to the nearest ER via EMS.  . valsartan-hydrochlorothiazide (DIOVAN-HCT) 80-12.5 MG tablet Take 1 tablet by mouth daily.     PAST MEDICAL HISTORY: Past Medical History:  Diagnosis Date  . Diabetes mellitus without complication ([place])   . Hyperlipidemia   . Hypertension   . Stroke ([place])   . TIA  (transient ischemic attack) 01/22/2019    PAST SURGICAL HISTORY: Past Surgical History:  Procedure Laterality Date  . NO PAST SURGERIES      FAMILY HISTORY: The patient family history includes Diabetes in an other family member; Hypertension in her maternal grandmother.  SOCIAL HISTORY:  The patient  reports that she quit smoking about 14 months ago. Her smoking use included cigarettes. She has a 15.00 pack-year smoking history. She has never used smokeless tobacco. She reports previous drug use. Drugs: Marijuana and Cocaine. She reports that she does not drink alcohol.  REVIEW OF SYSTEMS: Review of Systems  Constitutional: Negative for chills and fever.  HENT: Negative for hoarse voice and nosebleeds.   Eyes: Negative for discharge, double vision and pain.  Cardiovascular: Negative for chest pain, claudication, dyspnea on exertion, leg swelling, near-syncope, orthopnea, palpitations, paroxysmal nocturnal dyspnea and syncope.  Respiratory: Negative for hemoptysis and shortness of breath.   Musculoskeletal: Negative for muscle cramps and myalgias.  Gastrointestinal: Negative for abdominal pain, constipation, diarrhea, hematemesis, hematochezia, melena, nausea and vomiting.  Neurological: Positive for focal weakness (LUE weakness and LLE weakness (wears a brace). ). Negative for dizziness and light-headedness.    PHYSICAL EXAM: Vitals with BMI 12/21/2020 12/14/2020 11/30/2020  Height 5' 7" 5' 7" 5' 7"  Weight 192 lbs 194 lbs 194 lbs  BMI 30.06 95.18 84.16  Systolic 606 301 601  Diastolic 67 72 87  Pulse 093 111 103    CONSTITUTIONAL: Appears older than stated age, hemodynamically stable, no acute distress.   SKIN: Skin is warm and dry. No rash noted. No cyanosis. No pallor. No jaundice HEAD: Normocephalic and atraumatic.  EYES: No scleral icterus MOUTH/THROAT: Moist oral membranes.  NECK: No JVD present. No thyromegaly noted. No carotid bruits  LYMPHATIC: No visible cervical  adenopathy.  CHEST Normal respiratory effort. No intercostal retractions  LUNGS: Clear to auscultation bilaterally. No stridor. No wheezes. No rales.  CARDIOVASCULAR: Regular, tachycardic, positive S1-S2, no murmurs rubs or gallops appreciated. ABDOMINAL: Soft, nontender, nondistended, positive bowel sounds in all 4 quadrants, no apparent ascites.  EXTREMITIES: No peripheral edema, brace present over the left lower extremity.  HEMATOLOGIC: No significant bruising NEUROLOGIC: Oriented to person, place, and time.  Strength over the left upper and lower extremity 3+ and preserved involving the right upper and lower extremity PSYCHIATRIC: Normal mood and affect. Normal behavior. Cooperative  Radiology/Studies:   Ct Angio Head W Or Wo Contrast Result Date: 01/22/2019 CLINICAL DATA:  Right facial droop, slurred speech, right arm numbness. Diabetes and hypertension EXAM: CT ANGIOGRAPHY HEAD AND NECK TECHNIQUE: Multidetector CT imaging of the head and neck was performed using the standard protocol during bolus administration of intravenous contrast. Multiplanar CT image reconstructions and MIPs were obtained to evaluate the vascular anatomy. Carotid stenosis measurements (when applicable) are obtained utilizing NASCET criteria, using the distal internal carotid diameter as the denominator. CONTRAST:  22m OMNIPAQUE IOHEXOL 350 MG/ML SOLN COMPARISON:  None. FINDINGS: CT HEAD FINDINGS Brain: No evidence of acute infarction, hemorrhage, hydrocephalus, extra-axial collection or mass lesion/mass effect. Vascular: Negative for hyperdense vessel Skull: Negative Sinuses: Negative Orbits: Negative Review of the MIP images confirms  the above findings CTA NECK FINDINGS Aortic arch: Standard branching. Imaged portion shows no evidence of aneurysm or dissection. No significant stenosis of the major arch vessel origins. Right carotid system: Right carotid bifurcation widely patent. Mild fusiform dilatation of the distal  right internal carotid artery below the skull base with mild irregularity, question mild FMD. No significant stenosis Left carotid system: Similar findings to the right carotid with mild fusiform dilatation of the internal carotid artery below the skull base. Mild narrowing at the origin of the left internal carotid artery. No calcification or aneurysm. No significant stenosis. Vertebral arteries: Both vertebral arteries widely patent to the basilar without stenosis. Skeleton: No acute skeletal abnormality. Poor dentition with numerous caries. Other neck: Negative for soft tissue mass or adenopathy. Upper chest: Negative Review of the MIP images confirms the above findings CTA HEAD FINDINGS Anterior circulation: Mild narrowing of the right cavernous carotid compared to the right internal carotid artery below the skull base. Right anterior and middle cerebral arteries widely patent Diffuse narrowing left cavernous carotid with mild-to-moderate focal stenosis proximal cavernous carotid. Left anterior and middle cerebral arteries widely patent. Posterior circulation: Both vertebral arteries patent to the basilar. PICA patent bilaterally. Basilar widely patent. Superior cerebellar and posterior cerebral arteries widely patent. Venous sinuses: Patent Anatomic variants: None Delayed phase: Normal enhancement postcontrast administration. Review of the MIP images confirms the above findings IMPRESSION: 1. Negative CT head 2. Negative for emergent large vessel occlusion 3. Mild fusiform dilatation of the internal carotid artery below the skull base with subsequent narrowing to the cavernous carotid bilaterally. Question FMD. No intracranial aneurysm. 4. These results were called by telephone at the time of interpretation on 01/22/2019 at 9:36 pm to Dr. Kerney Elbe , who verbally acknowledged these results. Electronically Signed   By: Franchot Gallo M.D.   On: 01/22/2019 21:36   Dg Chest 2 View Result Date:  01/23/2019 CLINICAL DATA:  Dizziness EXAM: CHEST - 2 VIEW COMPARISON:  07/29/2016 FINDINGS: The heart size and mediastinal contours are within normal limits. Both lungs are clear. The visualized skeletal structures are unremarkable. IMPRESSION: No active cardiopulmonary disease. Electronically Signed   By: Ulyses Jarred M.D.   On: 01/23/2019 02:35   Mr Brain Wo Contrast Result Date: 01/23/2019 CLINICAL DATA:  Near syncopal event with slurred speech and right upper extremity numbness. Right facial droop. Symptoms resolved after 5 minutes. EXAM: MRI HEAD WITHOUT CONTRAST TECHNIQUE: Multiplanar, multiecho pulse sequences of the brain and surrounding structures were obtained without intravenous contrast. COMPARISON:  CTA head neck 01/22/2019 FINDINGS: BRAIN: There is multifocal acute ischemia, mostly within the left hemisphere, with punctate foci along the left precentral gyrus and within the posterior left parietal lobe. There is a single focus in the right hemispheric white matter and a small linear focus in the left occipital lobe. The midline structures are normal. No midline shift or other mass effect. Multifocal white matter hyperintensity, most commonly due to chronic ischemic microangiopathy. The cerebral and cerebellar volume are age-appropriate. No hydrocephalus. Susceptibility-sensitive sequences show no chronic microhemorrhage or superficial siderosis. No mass lesion. VASCULAR: The major intracranial arterial and venous sinus flow voids are normal. SKULL AND UPPER CERVICAL SPINE: Calvarial bone marrow signal is normal. There is no skull base mass. Visualized upper cervical spine and soft tissues are normal. SINUSES/ORBITS: No fluid levels or advanced mucosal thickening. No mastoid or middle ear effusion. The orbits are normal. IMPRESSION: 1. Multifocal acute ischemia, predominantly in the left hemisphere, although there is a single  focus within the right frontal white matter. Punctate ischemic foci  along the left motor strip are compatible with reported right upper extremity symptoms. 2. No hemorrhage or mass effect. 3. Mild findings of chronic ischemic microangiopathy. Electronically Signed   By: Ulyses Jarred M.D.   On: 01/23/2019 02:35   MRI brain without contrast 03/03/2020: 1. Scattered multifocal subcentimeter acute ischemic infarcts involving the right cerebral hemisphere as above. Few additional subcentimeter infarcts involve the left occipital lobe and inferior right cerebellum. No associated hemorrhage or mass effect. 2. Small chronic infarct involving the right middle cerebellar peduncle, chronic in appearance, but new as compared to previous. 3. Underlying mild to moderate chronic microvascular ischemic disease, mildly progressed from previous.   CTA neck with and without contrast: 04/15/2020. 1. Acute proximal right ICA occlusion, new from previous. Distal reconstitution at the supraclinoid segment via collateral flow across the circle-of-Willis. Right MCA is perfused although attenuated as compared to the contralateral left. 2. Otherwise stable CTA of the head and neck. No other large vessel occlusion, hemodynamically significant stenosis, or other acute vascular abnormality. 3. 4 mm left upper lobe nodule, indeterminate. No follow-up needed if patient is low-risk. Non-contrast chest CT can be considered in 12 months if patient is high-risk. This recommendation follows the consensus statement: Guidelines for Management of Incidental Pulmonary Nodules Detected on CT Images: From the Fleischner Society 2017; Radiology 2017; 284:228-243.  MRI brain with and without contrast 04/15/2020: Multiple new acute infarcts in the right cerebral hemisphere. Small acute infarct of the right pons.  CARDIAC DATABASE: EKG: 11/30/2020: Sinus  Tachycardia, 101bpm, consider old anteroseptal infarct, without underlying injury pattern.   Echocardiogram: 03/04/2020: 1. Left ventricular ejection  fraction, by estimation, is 60 to 65%. The  left ventricle has normal function. The left ventricle has no regional  wall motion abnormalities. There is mild left ventricular hypertrophy.  Left ventricular diastolic parameters  are consistent with Grade I diastolic dysfunction (impaired relaxation).  2. Right ventricular systolic function is normal. The right ventricular  size is normal.  3. The mitral valve is normal in structure. No evidence of mitral valve  regurgitation. No evidence of mitral stenosis.  4. The aortic valve is normal in structure. Aortic valve regurgitation is  not visualized. No aortic stenosis is present.  5. The inferior vena cava is normal in size with greater than 50%  respiratory variability, suggesting right atrial pressure of 3 mmHg.   Conclusion(s)/Recommendation(s): No intracardiac source of embolism  detected on this transthoracic study. A transesophageal echocardiogram is  recommended to exclude cardiac source of embolism if clinically indicated.   Stress Testing: Lexiscan Sestamibi stress test 12/10/2020: Abnormal nuclear stress test: 1 Day Rest/Stress Protocol. Stress EKG is non-diagnostic, as this is pharmacological stress test using Lexiscan. Medium size, moderate intensity, reversible perfusion defect suggestive of ischemia in the LCx distribution. Small size, moderate intensity, reversible perfusion defect suggestive of ischemia in the LAD distribution. Small size, fixed perfusion defect involving the basal to mid inferior segments most likely secondary to soft tissue attenuation artifact with possible small degree of reversible ischemia involving the basal to mid inferoseptal segments cannot be ruled out. Calculated LVEF 64% without regional wall motion abnormalities. Clinical correlation required.  Heart Catheterization: None   Zio Patch Extended out patient EKG monitoring 14 days starting 10/10/2020: Predominant rhythm is normal sinus  rhythm.  Minimum heart rate 67 and maximum heart rate 160 bpm with an average heart rate of 98 bpm. There were occasional PACs and atrial  couplets.  There were rare PVCs.  There was no atrial fibrillation. There was no heart block, no symptomatic events recorded   LABORATORY DATA: CBC Latest Ref Rng & Units 04/23/2020 04/15/2020 03/03/2020  WBC 4.0 - 10.5 K/uL 7.5 8.6 -  Hemoglobin 12.0 - 15.0 g/dL 13.9 14.1 15.0  Hematocrit 36.0 - 46.0 % 43.7 43.2 44.0  Platelets 150 - 400 K/uL 199 214 -    CMP Latest Ref Rng & Units 04/23/2020 04/15/2020 03/03/2020  Glucose 70 - 99 mg/dL 200(H) 378(H) 319(H)  BUN 6 - 20 mg/dL _0 Creatinine 0.44 - 1.00 mg/dL 0.81 0.89 0.70  Sodium 135 - 145 mmol/L 133(L) 135 135  Potassium 3.5 - 5.1 mmol/L 3.9 3.7 4.3  Chloride 98 - 111 mmol/L 101 100 100  CO2 22 - 32 mmol/L 21(L) 22 -  Calcium 8.9 - 10.3 mg/dL 9.5 9.7 -  Total Protein 6.5 - 8.1 g/dL 7.1 8.2(H) -  Total Bilirubin 0.3 - 1.2 mg/dL 0.7 0.8 -  Alkaline Phos 38 - 126 U/L 74 117 -  AST 15 - 41 U/L 23 24 -  ALT 0 - 44 U/L 21 20 -    Lipid Panel     Component Value Date/Time   CHOL 258 (H) 03/04/2020 0619   TRIG 133 03/04/2020 0619   HDL 29 (L) 03/04/2020 0619   CHOLHDL 8.9 03/04/2020 0619   VLDL 27 03/04/2020 0619   LDLCALC 202 (H) 03/04/2020 0619    No components found for: NTPROBNP No results for input(s): PROBNP in the last 8760 hours. No results for input(s): TSH in the last 8760 hours.  BMP Recent Labs    03/03/20 1232 03/03/20 1238 04/15/20 1652 04/23/20 1125  NA 134* 135 135 133*  K 4.6 4.3 3.7 3.9  CL 99 100 100 101  CO2 20*  --  22 21*  GLUCOSE 316* 319* 378* 200*  BUN _1 CREATININE 0.85 0.70 0.89 0.81  CALCIUM 9.3  --  9.7 9.5  GFRNONAA >60  --  >60 >60  GFRAA >60  --  >60 >60    HEMOGLOBIN A1C Lab Results  Component Value Date   HGBA1C 10.7 (H) 03/04/2020   MPG 260.39 03/04/2020    External Labs: Collected: 11/28/2020 provided by PCP during the office  visit Creatinine 0.81 mg/dL. eGFR: 105 mL/min per 1.73 m Lipid profile: Total cholesterol 122, triglycerides 91, HDL 34, LDL 71, non-HDL 88 Sodium 137, potassium 4.6, chloride 103, bicarbonate 24, AST 18, ALT 35, alkaline phosphatase 76 Hemoglobin 12.3 g/dL, hematocrit 38.9% Hemoglobin A1c: 8  IMPRESSION:    ICD-10-CM   1. Abnormal stress test  R94.39 metoprolol succinate (TOPROL XL) 25 MG 24 hr tablet    nitroGLYCERIN (NITROSTAT) 0.4 MG SL tablet  2. Precordial pain  R07.2 metoprolol succinate (TOPROL XL) 25 MG 24 hr tablet    nitroGLYCERIN (NITROSTAT) 0.4 MG SL tablet  3. Essential hypertension, benign  I10   4. Hypercholesteremia  E78.00   5. Type 2 diabetes mellitus with hyperglycemia, without long-term current use of insulin (HCC)  E11.65   6. Hx of bilateral embolic infarcts  W96.04   7. Hx of right proximal ICA occlusion  I63.231   8. Former smoker  Z87.891   20. Hx of cocaine abuse ([place])  F14.11   10. Status post placement of implantable loop recorder  Z95.818      RECOMMENDATIONS: Ebony Matthews is a 40 y.o. female whose past medical history  and cardiac risk factors include: Cryptogenic stroke w/ left-sided deficits, diabetes mellitus, hypertension, back pain, smoker, marijuana and cocaine use.  Abnormal nuclear stress test:  During the last office visit patient complained of precordial chest pain though atypical features that shared decision was to proceed with an ischemic evaluation given her multiple cardiovascular risk factors as outlined above.  Patient is nuclear stress test suggests reversible ischemia possible in the LCx and LAD distribution.  The images were reviewed with both the patient and her aunt during today's office visit.  Today patient states that she not having any chest discomfort and would like to uptitrate pharmacological therapy as opposed to undergoing either coronary CTA or left heart catheterization at this time.  Patient is educated on seeking  medical attention if she has symptoms suggestive of typical chest pain or atypical symptoms that increase in intensity, frequency, and/or duration by going to the closest ER via EMS.  Both the patient and her aunt verbalized understanding.  Start Toprol-XL 25 mg p.o. daily.  Sublingual nitroglycerin tablet to use on as needed basis medication profile discussed  History of multiple strokes:  Currently follows with neurology.  Recommended that she has a referral to hematology oncology for possible hypercoagulable work-up as she has had more than 1 stroke and miscarriages early in life.    Status post loop recorder implantation.  The site is clean dry and healing well.  Without any drainage or signs of infection.    Patient is educated on improving her modifiable cardiovascular risk factors for secondary prevention.  Medications reconciled.  History of right ICA occlusion status post stroke:  Currently on Plavix and statin therapy.  Patient has an upcoming appointment with vascular surgery to establish care.  FINAL MEDICATION LIST END OF ENCOUNTER: Meds ordered this encounter  Medications  . metoprolol succinate (TOPROL XL) 25 MG 24 hr tablet    Sig: Take 1 tablet (25 mg total) by mouth daily. Hold if top blood pressure number less than 100 mmHg or heart rate less than 60 bpm (pulse).    Dispense:  30 tablet    Refill:  2  . nitroGLYCERIN (NITROSTAT) 0.4 MG SL tablet    Sig: Place 1 tablet (0.4 mg total) under the tongue every 5 (five) minutes as needed for chest pain. If you require more than two tablets five minutes apart go to the nearest ER via EMS.    Dispense:  30 tablet    Refill:  0     Current Outpatient Medications:  .  atorvastatin (LIPITOR) 40 MG tablet, Take 40 mg by mouth at bedtime., Disp: , Rfl:  .  clopidogrel (PLAVIX) 75 MG tablet, Take 1 tablet (75 mg total) by mouth daily., Disp: 30 tablet, Rfl: 0 .  Empagliflozin (JARDIANCE PO), Take 10 mg by mouth at  bedtime., Disp: , Rfl:  .  metFORMIN (GLUCOPHAGE) 1000 MG tablet, Take 1 tablet (1,000 mg total) by mouth 2 (two) times daily., Disp: 60 tablet, Rfl: 0 .  metoprolol succinate (TOPROL XL) 25 MG 24 hr tablet, Take 1 tablet (25 mg total) by mouth daily. Hold if top blood pressure number less than 100 mmHg or heart rate less than 60 bpm (pulse)., Disp: 30 tablet, Rfl: 2 .  nitroGLYCERIN (NITROSTAT) 0.4 MG SL tablet, Place 1 tablet (0.4 mg total) under the tongue every 5 (five) minutes as needed for chest pain. If you require more than two tablets five minutes apart go to the nearest ER via EMS., Disp:  30 tablet, Rfl: 0 .  valsartan-hydrochlorothiazide (DIOVAN-HCT) 80-12.5 MG tablet, Take 1 tablet by mouth daily., Disp: , Rfl:   No orders of the defined types were placed in this encounter.   There are no Patient Instructions on file for this visit.   --Continue cardiac medications as reconciled in final medication list. --Return in about 4 weeks (around 01/18/2021) for Reevaluation of chest pain. Or sooner if needed. --Continue follow-up with your primary care physician regarding the management of your other chronic comorbid conditions.  Patient's questions and concerns were addressed to her satisfaction. She voices understanding of the instructions provided during this encounter.   This note was created using a voice recognition software as a result there may be grammatical errors inadvertently enclosed that do not reflect the nature of this encounter. Every attempt is made to correct such errors.  Rex Kras, [place], [facility]  Pager: [phone] Office: [phone]

## 2020-12-28 ENCOUNTER — Ambulatory Visit: Payer: Medicaid Other | Admitting: Cardiology

## 2021-01-03 ENCOUNTER — Other Ambulatory Visit: Payer: Self-pay

## 2021-01-03 DIAGNOSIS — G459 Transient cerebral ischemic attack, unspecified: Secondary | ICD-10-CM

## 2021-01-12 NOTE — Progress Notes (Signed)
  Prodedure performed: Insertion of Biotronik Loop Recorder. Serial # [id number]  Indication: Hx of bilateral embolic infarcts  Blood pressure 107/72, pulse (!) 111, temperature 97.6 F (36.4 C), resp. rate 16, height 5\' 7"  (1.702 m), weight 194 lb (88 kg), SpO2 98 %.   After obtaining informed consent, explaining the procedure to the patient, under sterile precautions, local anesthesia with 1% lidocaine with epinephrine was injected subcutaneously in the left parasternal region.  20 mL utilized.  A small nick was made in the left paraspinal region at the 3rd intercostal space. Biotronik loop recorder was inserted without any complications.  Patient tolerated the procedure well.  The incision was closed with suture and steri-strips.  There is no blood loss, no hematoma.  Written instrtuctions regarding wound care given to the patient.  Device setting:  R wave amplitude 0.30mV.  Programmed to AF detection to Medium.  HVR 160/min. Bradycardia 50 BPM Asystole 3 Sec    ICD-10-CM   1. Hx of bilateral embolic infarcts  Z86.73      [address] [facility]  Pager: [phone] Office: [phone]

## 2021-01-17 NOTE — Progress Notes (Signed)
ID:  Ebony Matthews, DOB 08/29/1981, MRN [id number]  PCP:  Jordan Hawks, NP  Cardiologist:  Rex Kras, DO, [facility] (established care 11/30/2020) Cardiology EP: Dr. Cristopher Peru.   Date: 01/18/21 Last Office Visit: 12/21/2020  Chief Complaint  Patient presents with  . Chest Pain  . Follow-up     HPI  Ebony Matthews is a 40 y.o. female who presents to the office with a chief complaint of " 1 month follow-up for chest pain." Patient's past medical history and cardiovascular risk factors include: History of multiple strokes with residual deficits, right ICA occlusion, hypertension, diabetes, history of cocaine use, former smoker.  She is referred to the office at the request of Jordan Hawks, NP for evaluation of loop recorder implantation.  She  is accompanied by [name] at today's office visit.  She provides verbal consent in regards to discussing her medical information in their presence.  Given her history of 2 strokes in the recent past with residual deficits.  Based on electronic medical records initial stroke was considered to be cryptogenic etiology however the second stroke that happened in July 2021 was most likely secondary to right ICA occlusion.  In the past patient has been evaluated by cardiac electrophysiologist Dr. Crissie Sickles for loop recorder implantation the patient had failed to follow-up.  Surface echocardiograms in the past were negative for PFO.  In the past transesophageal echocardiogram was also recommended.  However a TEE was not performed and as per electronic medical records it was due to the ongoing COVID-19 pandemic, her recent use of cocaine, and noncompliance with medical therapy due to multiple cardiovascular comorbidities, findings suggestive of poor insight and medical health.  Since then patient has reestablished care with with neurology and underwent a 14-day monitor to evaluate for atrial fibrillation burden.  However based on the most recent  monitor results there is no detection of atrial fibrillation during the monitoring period.  And therefore she was referred to cardiology for loop recorder implantation.  Since establishing care with the office patient underwent a loop recorder implantation and has done well.  Given her other multiple cardiovascular risk factors that shared decision was to proceed with an ischemic evaluation.  Patient was noted to have reversible ischemia on a nuclear perfusion stress test and therefore recommended to have a left heart catheterization with possible intervention.  However, since she was asymptomatic this shared decision was to uptitrate antianginal therapy.  Patient was started on metoprolol at the last office visit and has done well.  Her hemodynamics have also improved.  Patient states that she would like to continue pharmacological therapy for now and does not want to undergo left heart catheterization.  Patient verbalizes understanding that if her chest pain were to resurface she will seek medical attention by going to the closest ER via EMS.  Since last visit she has not used sublingual nitroglycerin tablets.  FUNCTIONAL STATUS: Limited as she ambulates with either a cane or walker after her strokes.  ALLERGIES: No Known Allergies  MEDICATION LIST PRIOR TO VISIT: Current Meds  Medication Sig  . atorvastatin (LIPITOR) 40 MG tablet Take 40 mg by mouth at bedtime.  . clopidogrel (PLAVIX) 75 MG tablet Take 1 tablet (75 mg total) by mouth daily.  . Empagliflozin (JARDIANCE PO) Take 10 mg by mouth at bedtime.  . metFORMIN (GLUCOPHAGE) 1000 MG tablet Take 1 tablet (1,000 mg total) by mouth 2 (two) times daily.  . metoprolol succinate (TOPROL XL) 25 MG 24  hr tablet Take 1 tablet (25 mg total) by mouth daily. Hold if top blood pressure number less than 100 mmHg or heart rate less than 60 bpm (pulse).  . nitroGLYCERIN (NITROSTAT) 0.4 MG SL tablet Place 1 tablet (0.4 mg total) under the tongue every 5  (five) minutes as needed for chest pain. If you require more than two tablets five minutes apart go to the nearest ER via EMS.     PAST MEDICAL HISTORY: Past Medical History:  Diagnosis Date  . Diabetes mellitus without complication ([place])   . Hyperlipidemia   . Hypertension   . Stroke ([place])   . TIA (transient ischemic attack) 01/22/2019    PAST SURGICAL HISTORY: Past Surgical History:  Procedure Laterality Date  . NO PAST SURGERIES      FAMILY HISTORY: The patient family history includes Diabetes in an other family member; Hypertension in her maternal grandmother.  SOCIAL HISTORY:  The patient  reports that she quit smoking about 15 months ago. Her smoking use included cigarettes. She has a 15.00 pack-year smoking history. She has never used smokeless tobacco. She reports previous drug use. Drugs: Marijuana and Cocaine. She reports that she does not drink alcohol.  REVIEW OF SYSTEMS: Review of Systems  Constitutional: Negative for chills and fever.  HENT: Negative for hoarse voice and nosebleeds.   Eyes: Negative for discharge, double vision and pain.  Cardiovascular: Negative for chest pain, claudication, dyspnea on exertion, leg swelling, near-syncope, orthopnea, palpitations, paroxysmal nocturnal dyspnea and syncope.  Respiratory: Negative for hemoptysis and shortness of breath.   Musculoskeletal: Negative for muscle cramps and myalgias.  Gastrointestinal: Negative for abdominal pain, constipation, diarrhea, hematemesis, hematochezia, melena, nausea and vomiting.  Neurological: Positive for focal weakness (LUE weakness and LLE weakness (wears a brace). ). Negative for dizziness and light-headedness.    PHYSICAL EXAM: Vitals with BMI 01/18/2021 12/21/2020 12/14/2020  Height $Remov'5\' 7"'tcqLqt$  $Remove'5\' 7"'HayKQYG$  $RemoveB'5\' 7"'KVwjueKO$   Weight 200 lbs 13 oz 192 lbs 194 lbs  BMI 31.44 76.16 07.37  Systolic 106 269 485  Diastolic 75 67 72  Pulse 89 107 111    CONSTITUTIONAL: Appears older than stated age,  hemodynamically stable, no acute distress.   SKIN: Skin is warm and dry. No rash noted. No cyanosis. No pallor. No jaundice HEAD: Normocephalic and atraumatic.  EYES: No scleral icterus MOUTH/THROAT: Moist oral membranes.  NECK: No JVD present. No thyromegaly noted. No carotid bruits  LYMPHATIC: No visible cervical adenopathy.  CHEST Normal respiratory effort. No intercostal retractions  LUNGS: Clear to auscultation bilaterally. No stridor. No wheezes. No rales.  CARDIOVASCULAR: Regular, tachycardic, positive S1-S2, no murmurs rubs or gallops appreciated. ABDOMINAL: Soft, nontender, nondistended, positive bowel sounds in all 4 quadrants, no apparent ascites.  EXTREMITIES: No peripheral edema, brace present over the left lower extremity.  HEMATOLOGIC: No significant bruising NEUROLOGIC: Oriented to person, place, and time.  Strength over the left upper and lower extremity 3+ and preserved involving the right upper and lower extremity PSYCHIATRIC: Normal mood and affect. Normal behavior. Cooperative  Radiology/Studies:   Ct Angio Head W Or Wo Contrast Result Date: 01/22/2019 CLINICAL DATA:  Right facial droop, slurred speech, right arm numbness. Diabetes and hypertension EXAM: CT ANGIOGRAPHY HEAD AND NECK TECHNIQUE: Multidetector CT imaging of the head and neck was performed using the standard protocol during bolus administration of intravenous contrast. Multiplanar CT image reconstructions and MIPs were obtained to evaluate the vascular anatomy. Carotid stenosis measurements (when applicable) are obtained utilizing NASCET criteria, using the distal internal  carotid diameter as the denominator. CONTRAST:  49mL OMNIPAQUE IOHEXOL 350 MG/ML SOLN COMPARISON:  None. FINDINGS: CT HEAD FINDINGS Brain: No evidence of acute infarction, hemorrhage, hydrocephalus, extra-axial collection or mass lesion/mass effect. Vascular: Negative for hyperdense vessel Skull: Negative Sinuses: Negative Orbits: Negative  Review of the MIP images confirms the above findings CTA NECK FINDINGS Aortic arch: Standard branching. Imaged portion shows no evidence of aneurysm or dissection. No significant stenosis of the major arch vessel origins. Right carotid system: Right carotid bifurcation widely patent. Mild fusiform dilatation of the distal right internal carotid artery below the skull base with mild irregularity, question mild FMD. No significant stenosis Left carotid system: Similar findings to the right carotid with mild fusiform dilatation of the internal carotid artery below the skull base. Mild narrowing at the origin of the left internal carotid artery. No calcification or aneurysm. No significant stenosis. Vertebral arteries: Both vertebral arteries widely patent to the basilar without stenosis. Skeleton: No acute skeletal abnormality. Poor dentition with numerous caries. Other neck: Negative for soft tissue mass or adenopathy. Upper chest: Negative Review of the MIP images confirms the above findings CTA HEAD FINDINGS Anterior circulation: Mild narrowing of the right cavernous carotid compared to the right internal carotid artery below the skull base. Right anterior and middle cerebral arteries widely patent Diffuse narrowing left cavernous carotid with mild-to-moderate focal stenosis proximal cavernous carotid. Left anterior and middle cerebral arteries widely patent. Posterior circulation: Both vertebral arteries patent to the basilar. PICA patent bilaterally. Basilar widely patent. Superior cerebellar and posterior cerebral arteries widely patent. Venous sinuses: Patent Anatomic variants: None Delayed phase: Normal enhancement postcontrast administration. Review of the MIP images confirms the above findings IMPRESSION: 1. Negative CT head 2. Negative for emergent large vessel occlusion 3. Mild fusiform dilatation of the internal carotid artery below the skull base with subsequent narrowing to the cavernous carotid  bilaterally. Question FMD. No intracranial aneurysm. 4. These results were called by telephone at the time of interpretation on 01/22/2019 at 9:36 pm to Dr. Kerney Elbe , who verbally acknowledged these results. Electronically Signed   By: Franchot Gallo M.D.   On: 01/22/2019 21:36   Dg Chest 2 View Result Date: 01/23/2019 CLINICAL DATA:  Dizziness EXAM: CHEST - 2 VIEW COMPARISON:  07/29/2016 FINDINGS: The heart size and mediastinal contours are within normal limits. Both lungs are clear. The visualized skeletal structures are unremarkable. IMPRESSION: No active cardiopulmonary disease. Electronically Signed   By: Ulyses Jarred M.D.   On: 01/23/2019 02:35   Mr Brain Wo Contrast Result Date: 01/23/2019 CLINICAL DATA:  Near syncopal event with slurred speech and right upper extremity numbness. Right facial droop. Symptoms resolved after 5 minutes. EXAM: MRI HEAD WITHOUT CONTRAST TECHNIQUE: Multiplanar, multiecho pulse sequences of the brain and surrounding structures were obtained without intravenous contrast. COMPARISON:  CTA head neck 01/22/2019 FINDINGS: BRAIN: There is multifocal acute ischemia, mostly within the left hemisphere, with punctate foci along the left precentral gyrus and within the posterior left parietal lobe. There is a single focus in the right hemispheric white matter and a small linear focus in the left occipital lobe. The midline structures are normal. No midline shift or other mass effect. Multifocal white matter hyperintensity, most commonly due to chronic ischemic microangiopathy. The cerebral and cerebellar volume are age-appropriate. No hydrocephalus. Susceptibility-sensitive sequences show no chronic microhemorrhage or superficial siderosis. No mass lesion. VASCULAR: The major intracranial arterial and venous sinus flow voids are normal. SKULL AND UPPER CERVICAL SPINE: Calvarial bone marrow  signal is normal. There is no skull base mass. Visualized upper cervical spine and soft  tissues are normal. SINUSES/ORBITS: No fluid levels or advanced mucosal thickening. No mastoid or middle ear effusion. The orbits are normal. IMPRESSION: 1. Multifocal acute ischemia, predominantly in the left hemisphere, although there is a single focus within the right frontal white matter. Punctate ischemic foci along the left motor strip are compatible with reported right upper extremity symptoms. 2. No hemorrhage or mass effect. 3. Mild findings of chronic ischemic microangiopathy. Electronically Signed   By: Ulyses Jarred M.D.   On: 01/23/2019 02:35   MRI brain without contrast 03/03/2020: 1. Scattered multifocal subcentimeter acute ischemic infarcts involving the right cerebral hemisphere as above. Few additional subcentimeter infarcts involve the left occipital lobe and inferior right cerebellum. No associated hemorrhage or mass effect. 2. Small chronic infarct involving the right middle cerebellar peduncle, chronic in appearance, but new as compared to previous. 3. Underlying mild to moderate chronic microvascular ischemic disease, mildly progressed from previous.   CTA neck with and without contrast: 04/15/2020. 1. Acute proximal right ICA occlusion, new from previous. Distal reconstitution at the supraclinoid segment via collateral flow across the circle-of-Willis. Right MCA is perfused although attenuated as compared to the contralateral left. 2. Otherwise stable CTA of the head and neck. No other large vessel occlusion, hemodynamically significant stenosis, or other acute vascular abnormality. 3. 4 mm left upper lobe nodule, indeterminate. No follow-up needed if patient is low-risk. Non-contrast chest CT can be considered in 12 months if patient is high-risk. This recommendation follows the consensus statement: Guidelines for Management of Incidental Pulmonary Nodules Detected on CT Images: From the Fleischner Society 2017; Radiology 2017; 284:228-243.  MRI brain with and without contrast  04/15/2020: Multiple new acute infarcts in the right cerebral hemisphere. Small acute infarct of the right pons.  CARDIAC DATABASE: EKG: 11/30/2020: Sinus  Tachycardia, 101bpm, consider old anteroseptal infarct, without underlying injury pattern.   Echocardiogram: 03/04/2020: 1. Left ventricular ejection fraction, by estimation, is 60 to 65%. The  left ventricle has normal function. The left ventricle has no regional  wall motion abnormalities. There is mild left ventricular hypertrophy.  Left ventricular diastolic parameters  are consistent with Grade I diastolic dysfunction (impaired relaxation).  2. Right ventricular systolic function is normal. The right ventricular  size is normal.  3. The mitral valve is normal in structure. No evidence of mitral valve  regurgitation. No evidence of mitral stenosis.  4. The aortic valve is normal in structure. Aortic valve regurgitation is  not visualized. No aortic stenosis is present.  5. The inferior vena cava is normal in size with greater than 50%  respiratory variability, suggesting right atrial pressure of 3 mmHg.   Conclusion(s)/Recommendation(s): No intracardiac source of embolism  detected on this transthoracic study. A transesophageal echocardiogram is  recommended to exclude cardiac source of embolism if clinically indicated.   Stress Testing: Lexiscan Sestamibi stress test 12/10/2020: Abnormal nuclear stress test: 1 Day Rest/Stress Protocol. Stress EKG is non-diagnostic, as this is pharmacological stress test using Lexiscan. Medium size, moderate intensity, reversible perfusion defect suggestive of ischemia in the LCx distribution. Small size, moderate intensity, reversible perfusion defect suggestive of ischemia in the LAD distribution. Small size, fixed perfusion defect involving the basal to mid inferior segments most likely secondary to soft tissue attenuation artifact with possible small degree of reversible ischemia  involving the basal to mid inferoseptal segments cannot be ruled out. Calculated LVEF 64% without regional wall motion abnormalities.  Clinical correlation required.  Heart Catheterization: None   Zio Patch Extended out patient EKG monitoring 14 days starting 10/10/2020: Predominant rhythm is normal sinus rhythm.  Minimum heart rate 67 and maximum heart rate 160 bpm with an average heart rate of 98 bpm. There were occasional PACs and atrial couplets.  There were rare PVCs.  There was no atrial fibrillation. There was no heart block, no symptomatic events recorded   LABORATORY DATA: CBC Latest Ref Rng & Units 04/23/2020 04/15/2020 03/03/2020  WBC 4.0 - 10.5 K/uL 7.5 8.6 -  Hemoglobin 12.0 - 15.0 g/dL 13.9 14.1 15.0  Hematocrit 36.0 - 46.0 % 43.7 43.2 44.0  Platelets 150 - 400 K/uL 199 214 -    CMP Latest Ref Rng & Units 04/23/2020 04/15/2020 03/03/2020  Glucose 70 - 99 mg/dL 200(H) 378(H) 319(H)  BUN 6 - 20 mg/dL _0 Creatinine 0.44 - 1.00 mg/dL 0.81 0.89 0.70  Sodium 135 - 145 mmol/L 133(L) 135 135  Potassium 3.5 - 5.1 mmol/L 3.9 3.7 4.3  Chloride 98 - 111 mmol/L 101 100 100  CO2 22 - 32 mmol/L 21(L) 22 -  Calcium 8.9 - 10.3 mg/dL 9.5 9.7 -  Total Protein 6.5 - 8.1 g/dL 7.1 8.2(H) -  Total Bilirubin 0.3 - 1.2 mg/dL 0.7 0.8 -  Alkaline Phos 38 - 126 U/L 74 117 -  AST 15 - 41 U/L 23 24 -  ALT 0 - 44 U/L 21 20 -    Lipid Panel     Component Value Date/Time   CHOL 258 (H) 03/04/2020 0619   TRIG 133 03/04/2020 0619   HDL 29 (L) 03/04/2020 0619   CHOLHDL 8.9 03/04/2020 0619   VLDL 27 03/04/2020 0619   LDLCALC 202 (H) 03/04/2020 0619    No components found for: NTPROBNP No results for input(s): PROBNP in the last 8760 hours. No results for input(s): TSH in the last 8760 hours.  BMP Recent Labs    03/03/20 1232 03/03/20 1238 04/15/20 1652 04/23/20 1125  NA 134* 135 135 133*  K 4.6 4.3 3.7 3.9  CL 99 100 100 101  CO2 20*  --  22 21*  GLUCOSE 316* 319* 378* 200*  BUN  _1 CREATININE 0.85 0.70 0.89 0.81  CALCIUM 9.3  --  9.7 9.5  GFRNONAA >60  --  >60 >60  GFRAA >60  --  >60 >60    HEMOGLOBIN A1C Lab Results  Component Value Date   HGBA1C 10.7 (H) 03/04/2020   MPG 260.39 03/04/2020    External Labs: Collected: 11/28/2020 provided by PCP during the office visit Creatinine 0.81 mg/dL. eGFR: 105 mL/min per 1.73 m Lipid profile: Total cholesterol 122, triglycerides 91, HDL 34, LDL 71, non-HDL 88 Sodium 137, potassium 4.6, chloride 103, bicarbonate 24, AST 18, ALT 35, alkaline phosphatase 76 Hemoglobin 12.3 g/dL, hematocrit 38.9% Hemoglobin A1c: 8  IMPRESSION:    ICD-10-CM   1. Precordial pain  R07.2   2. Abnormal stress test  R94.39   3. Essential hypertension, benign  I10   4. Hypercholesteremia  E78.00   5. Type 2 diabetes mellitus with hyperglycemia, without long-term current use of insulin (HCC)  E11.65   6. Hx of bilateral embolic infarcts  T64.35   7. Hx of right proximal ICA occlusion  I63.231   8. Former smoker  Z87.891   47. Hx of cocaine abuse ([place])  F14.11   10. Status post placement of implantable loop recorder  Z95.818  RECOMMENDATIONS: Ebony Matthews is a 40 y.o. female whose past medical history and cardiac risk factors include: Cryptogenic stroke w/ left-sided deficits, diabetes mellitus, hypertension, back pain, smoker, marijuana and cocaine use.  Abnormal nuclear stress test:  Given her history of precordial pain with possible cardiac etiology and multiple cardiovascular risk factors and ischemic evaluation was performed.    Nuclear stress test noted reversible ischemia in the LAD and the LCx distribution.    Recommended left heart catheterization with possible intervention.  However, patient chooses to treated medically as she is currently asymptomatic and not very active.    Uptitrate antianginal therapy at the last visit.  Patient remains asymptomatic.  No use of sublingual nitroglycerin tablets.  She  wants to continue, I directed medical therapy for now.    History of multiple strokes:  Currently follows with neurology.  Recommended that she has a referral to hematology oncology for possible hypercoagulable work-up as she has had more than 1 stroke and miscarriages early in life.    Status post loop recorder implantation.  Patient is educated on improving her modifiable cardiovascular risk factors for secondary prevention.  Medications reconciled.  History of right ICA occlusion status post stroke:  Currently on Plavix and statin therapy.  Patient has an upcoming appointment with vascular surgery to establish care.  FINAL MEDICATION LIST END OF ENCOUNTER: No orders of the defined types were placed in this encounter.    Current Outpatient Medications:  .  atorvastatin (LIPITOR) 40 MG tablet, Take 40 mg by mouth at bedtime., Disp: , Rfl:  .  clopidogrel (PLAVIX) 75 MG tablet, Take 1 tablet (75 mg total) by mouth daily., Disp: 30 tablet, Rfl: 0 .  Empagliflozin (JARDIANCE PO), Take 10 mg by mouth at bedtime., Disp: , Rfl:  .  metFORMIN (GLUCOPHAGE) 1000 MG tablet, Take 1 tablet (1,000 mg total) by mouth 2 (two) times daily., Disp: 60 tablet, Rfl: 0 .  metoprolol succinate (TOPROL XL) 25 MG 24 hr tablet, Take 1 tablet (25 mg total) by mouth daily. Hold if top blood pressure number less than 100 mmHg or heart rate less than 60 bpm (pulse)., Disp: 30 tablet, Rfl: 2 .  nitroGLYCERIN (NITROSTAT) 0.4 MG SL tablet, Place 1 tablet (0.4 mg total) under the tongue every 5 (five) minutes as needed for chest pain. If you require more than two tablets five minutes apart go to the nearest ER via EMS., Disp: 30 tablet, Rfl: 0 .  valsartan-hydrochlorothiazide (DIOVAN-HCT) 80-12.5 MG tablet, Take 1 tablet by mouth daily., Disp: , Rfl:   No orders of the defined types were placed in this encounter.   There are no Patient Instructions on file for this visit.   --Continue cardiac medications as  reconciled in final medication list. --Return in about 6 months (around 07/20/2021) for Follow up, Chest pain, loop recorder follow-up. Or sooner if needed. --Continue follow-up with your primary care physician regarding the management of your other chronic comorbid conditions.  Patient's questions and concerns were addressed to her satisfaction. She voices understanding of the instructions provided during this encounter.   This note was created using a voice recognition software as a result there may be grammatical errors inadvertently enclosed that do not reflect the nature of this encounter. Every attempt is made to correct such errors.  Rex Kras, [place], [facility]  Pager: [phone] Office: [phone]

## 2021-01-18 ENCOUNTER — Encounter: Payer: Self-pay | Admitting: Cardiology

## 2021-01-18 ENCOUNTER — Ambulatory Visit: Payer: Medicaid Other | Admitting: Cardiology

## 2021-01-18 ENCOUNTER — Other Ambulatory Visit: Payer: Self-pay

## 2021-01-18 VITALS — BP 119/75 | HR 89 | Temp 97.9°F | Resp 16 | Ht 67.0 in | Wt 200.8 lb

## 2021-01-18 DIAGNOSIS — I63231 Cerebral infarction due to unspecified occlusion or stenosis of right carotid arteries: Secondary | ICD-10-CM

## 2021-01-18 DIAGNOSIS — F1411 Cocaine abuse, in remission: Secondary | ICD-10-CM

## 2021-01-18 DIAGNOSIS — E1165 Type 2 diabetes mellitus with hyperglycemia: Secondary | ICD-10-CM

## 2021-01-18 DIAGNOSIS — Z95818 Presence of other cardiac implants and grafts: Secondary | ICD-10-CM

## 2021-01-18 DIAGNOSIS — Z8673 Personal history of transient ischemic attack (TIA), and cerebral infarction without residual deficits: Secondary | ICD-10-CM

## 2021-01-18 DIAGNOSIS — R072 Precordial pain: Secondary | ICD-10-CM

## 2021-01-18 DIAGNOSIS — I10 Essential (primary) hypertension: Secondary | ICD-10-CM

## 2021-01-18 DIAGNOSIS — E7800 Pure hypercholesterolemia, unspecified: Secondary | ICD-10-CM

## 2021-01-18 DIAGNOSIS — Z87891 Personal history of nicotine dependence: Secondary | ICD-10-CM

## 2021-01-18 DIAGNOSIS — R9439 Abnormal result of other cardiovascular function study: Secondary | ICD-10-CM

## 2021-01-20 NOTE — Progress Notes (Signed)
[facility] [place]  ASSESSMENT / PLAN: Ebony Matthews is a 40 y.o. female with chronic right internal carotid artery occlusion. No evidence of left carotid artery stenosis on CTA of July 2021. Carotid duplex today shows mild left carotid artery stenosis (PSV 86, EDV 32, 1-39% stenosis).   No role for intervention for chronically occluded RICA.   The patient should continue best medical therapy for carotid artery stenosis including: Complete cessation from all tobacco products. Blood glucose control with goal A1c < 7%. Blood pressure control with goal blood pressure < 140/90 mmHg. Lipid reduction therapy with goal LDL-C <100 mg/dL (<27 if symptomatic from carotid artery stenosis).  Aspirin 81mg  PO QD.  Atorvastatin 40-80mg  PO QD (or other "high intensity" statin therapy).  Follow up with PA in 2 years with repeat carotid duplex to follow mild LICA stenosis.   CHIEF COMPLAINT: prior strokes  HISTORY OF PRESENT ILLNESS: Ebony Matthews is a 40 y.o. female with past medical history of 2 strokes (most recent 2/2 RICA occlusion - July 2021), CAD, HTN (3 drugs with adequate control), DM2 (A1c 10.7), history of cocaine use, former smoker.   She presents to clinic today as a referral from Dr. August 2021 to discuss her chronically occluded RICA. Her mother is with her today. She reports no new focal symptoms since her previous strokes. She has profound left sided weakness. She gives one-word answers to questions.  VASCULAR SURGICAL HISTORY: none  Past Medical History:  Diagnosis Date  . Diabetes mellitus without complication (HCC)   . Hyperlipidemia   . Hypertension   . Stroke (HCC)   . TIA (transient ischemic attack) 01/22/2019    Past Surgical History:  Procedure Laterality Date  . NO PAST SURGERIES      Family History  Problem Relation Age of Onset  . Diabetes Other   . Hypertension Maternal Grandmother     Social History   Socioeconomic History  .  Marital status: Single    Spouse name: Not on file  . Number of children: 0  . Years of education: Not on file  . Highest education level: Not on file  Occupational History  . Not on file  Tobacco Use  . Smoking status: Former Smoker    Packs/day: 1.00    Years: 15.00    Pack years: 15.00    Types: Cigarettes    Quit date: 2021    Years since quitting: 1.3  . Smokeless tobacco: Never Used  Vaping Use  . Vaping Use: Never used  Substance and Sexual Activity  . Alcohol use: No  . Drug use: Not Currently    Types: Marijuana, Cocaine    Comment: occasional  . Sexual activity: Yes    Birth control/protection: None  Other Topics Concern  . Not on file  Social History Narrative   Right handed   Lives with friend one story home   Social Determinants of Health   Financial Resource Strain: Not on file  Food Insecurity: Not on file  Transportation Needs: Not on file  Physical Activity: Not on file  Stress: Not on file  Social Connections: Not on file  Intimate Partner Violence: Not on file    No Known Allergies  Current Outpatient Medications  Medication Sig Dispense Refill  . atorvastatin (LIPITOR) 40 MG tablet Take 40 mg by mouth at bedtime.    . clopidogrel (PLAVIX) 75 MG tablet Take 1 tablet (75 mg total) by mouth daily. 30 tablet 0  . Empagliflozin (JARDIANCE  PO) Take 10 mg by mouth at bedtime.    . metFORMIN (GLUCOPHAGE) 1000 MG tablet Take 1 tablet (1,000 mg total) by mouth 2 (two) times daily. 60 tablet 0  . metoprolol succinate (TOPROL XL) 25 MG 24 hr tablet Take 1 tablet (25 mg total) by mouth daily. Hold if top blood pressure number less than 100 mmHg or heart rate less than 60 bpm (pulse). 30 tablet 2  . nitroGLYCERIN (NITROSTAT) 0.4 MG SL tablet Place 1 tablet (0.4 mg total) under the tongue every 5 (five) minutes as needed for chest pain. If you require more than two tablets five minutes apart go to the nearest ER via EMS. 30 tablet 0  .  valsartan-hydrochlorothiazide (DIOVAN-HCT) 80-12.5 MG tablet Take 1 tablet by mouth daily.     No current facility-administered medications for this visit.    REVIEW OF SYSTEMS:  [X]  denotes positive finding, [ ]  denotes negative finding Cardiac  Comments:  Chest pain or chest pressure:    Shortness of breath upon exertion:    Short of breath when lying flat:    Irregular heart rhythm:        Vascular    Pain in calf, thigh, or hip brought on by ambulation:    Pain in feet at night that wakes you up from your sleep:     Blood clot in your veins:    Leg swelling:         Pulmonary    Oxygen at home:    Productive cough:     Wheezing:         Neurologic    Sudden weakness in arms or legs:  x chronic  Sudden numbness in arms or legs:     Sudden onset of difficulty speaking or slurred speech:    Temporary loss of vision in one eye:     Problems with dizziness:         Gastrointestinal    Blood in stool:     Vomited blood:         Genitourinary    Burning when urinating:     Blood in urine:        Psychiatric    Major depression:         Hematologic    Bleeding problems:    Problems with blood clotting too easily:        Skin    Rashes or ulcers:        Constitutional    Fever or chills:      PHYSICAL EXAM  Vitals:   01/22/21 0847  BP: 112/77  Pulse: 92  Resp: 20  Temp: (!) 97.2 F (36.2 C)  SpO2: 100%  Weight: 200 lb (90.7 kg)  Height: 5\' 7"  (1.702 m)     Constitutional: appears older than stated age. no distress. Appears well nourished.  Neurologic: CN intact. Expressive aphasia. Profound left sided weakness.  Psychiatric: Mood and affect symmetric and appropriate. Eyes: No icterus. No conjunctival pallor. Ears, nose, throat: mucous membranes moist. Midline trachea.  Cardiac: regular rate and rhythm.  Respiratory: unlabored. Abdominal: soft, non-tender, non-distended.  Extremity: No edema. No cyanosis. No pallor.  Skin: No gangrene. No  ulceration.  Lymphatic: No Stemmer's sign. No palpable lymphadenopathy.  PERTINENT LABORATORY AND RADIOLOGIC DATA  Most recent CBC CBC Latest Ref Rng & Units 04/23/2020 04/15/2020 03/03/2020  WBC 4.0 - 10.5 K/uL 7.5 8.6 -  Hemoglobin 12.0 - 15.0 g/dL 04/25/2020 04/17/2020 05/03/2020  Hematocrit 36.0 - 46.0 %  43.7 43.2 44.0  Platelets 150 - 400 K/uL 199 214 -     Most recent CMP CMP Latest Ref Rng & Units 04/23/2020 04/15/2020 03/03/2020  Glucose 70 - 99 mg/dL 644(I) 347(Q) 259(D)  BUN 6 - 20 mg/dL 7 7 11   Creatinine 0.44 - 1.00 mg/dL 6.38 7.56  Sodium 135 - 145 mmol/L 133(L) 135 135  Potassium 3.5 - 5.1 mmol/L 3.9 3.7 4.3  Chloride 98 - 111 mmol/L 101 100 100  CO2 22 - 32 mmol/L 21(L) 22 -  Calcium 8.9 - 10.3 mg/dL 9.5 9.7 -  Total Protein 6.5 - 8.1 g/dL 7.1 4.33) -  Total Bilirubin 0.3 - 1.2 mg/dL 0.7 0.8 -  Alkaline Phos 38 - 126 U/L 74 117 -  AST 15 - 41 U/L 23 24 -  ALT 0 - 44 U/L 21 20 -    Renal function CrCl cannot be calculated (Patient's most recent lab result is older than the maximum 21 days allowed.).  Hgb A1c MFr Bld (%)  Date Value  03/04/2020 10.7 (H)    LDL Cholesterol  Date Value Ref Range Status  03/04/2020 202 (H) 0 - 99 mg/dL Final    Comment:           Total Cholesterol/HDL:CHD Risk Coronary Heart Disease Risk Table                     Men   Women  1/2 Average Risk   3.4   3.3  Average Risk       5.0   4.4  2 X Average Risk   9.6   7.1  3 X Average Risk  23.4   11.0        Use the calculated Patient Ratio above and the CHD Risk Table to determine the patient's CHD Risk.        ATP III CLASSIFICATION (LDL):  <100     mg/dL   Optimal  05/04/2020  mg/dL   Near or Above                    Optimal  130-159  mg/dL   Borderline  188-416  mg/dL   High  606-301     mg/dL   Very High Performed at [facility], [address], [place], [place]      Vascular Imaging: Carotid Duplex 01/22/21 Occluded RICA LICA 1-39% (PSV 86, EDV 34)  Naleyah Ohlinger N.  01/24/21, MD [facility] Phone Number: [phone] 01/20/2021 9:13 AM

## 2021-01-22 ENCOUNTER — Ambulatory Visit (INDEPENDENT_AMBULATORY_CARE_PROVIDER_SITE_OTHER): Payer: Medicaid Other | Admitting: Vascular Surgery

## 2021-01-22 ENCOUNTER — Ambulatory Visit (HOSPITAL_COMMUNITY)
Admission: RE | Admit: 2021-01-22 | Discharge: 2021-01-22 | Disposition: A | Discharge status: Home / Self Care | Payer: Medicaid Other | Source: Ambulatory Visit | Attending: Vascular Surgery | Admitting: Vascular Surgery

## 2021-01-22 ENCOUNTER — Other Ambulatory Visit: Payer: Self-pay

## 2021-01-22 ENCOUNTER — Encounter: Payer: Self-pay | Admitting: Vascular Surgery

## 2021-01-22 VITALS — BP 112/77 | HR 92 | Temp 97.2°F | Resp 20 | Ht 67.0 in | Wt 200.0 lb

## 2021-01-22 DIAGNOSIS — I6521 Occlusion and stenosis of right carotid artery: Secondary | ICD-10-CM

## 2021-01-22 DIAGNOSIS — I6522 Occlusion and stenosis of left carotid artery: Secondary | ICD-10-CM

## 2021-01-22 DIAGNOSIS — G459 Transient cerebral ischemic attack, unspecified: Secondary | ICD-10-CM

## 2021-02-03 ENCOUNTER — Encounter: Payer: Self-pay | Admitting: Cardiology

## 2021-02-04 IMAGING — CR DG KNEE COMPLETE 4+V*L*
4 series · 4 of 4 positions shown · non-contrast
Comparison: None.

CLINICAL DATA: Pain following fall

EXAM:
LEFT KNEE - COMPLETE 4+ VIEW

[knee ap]
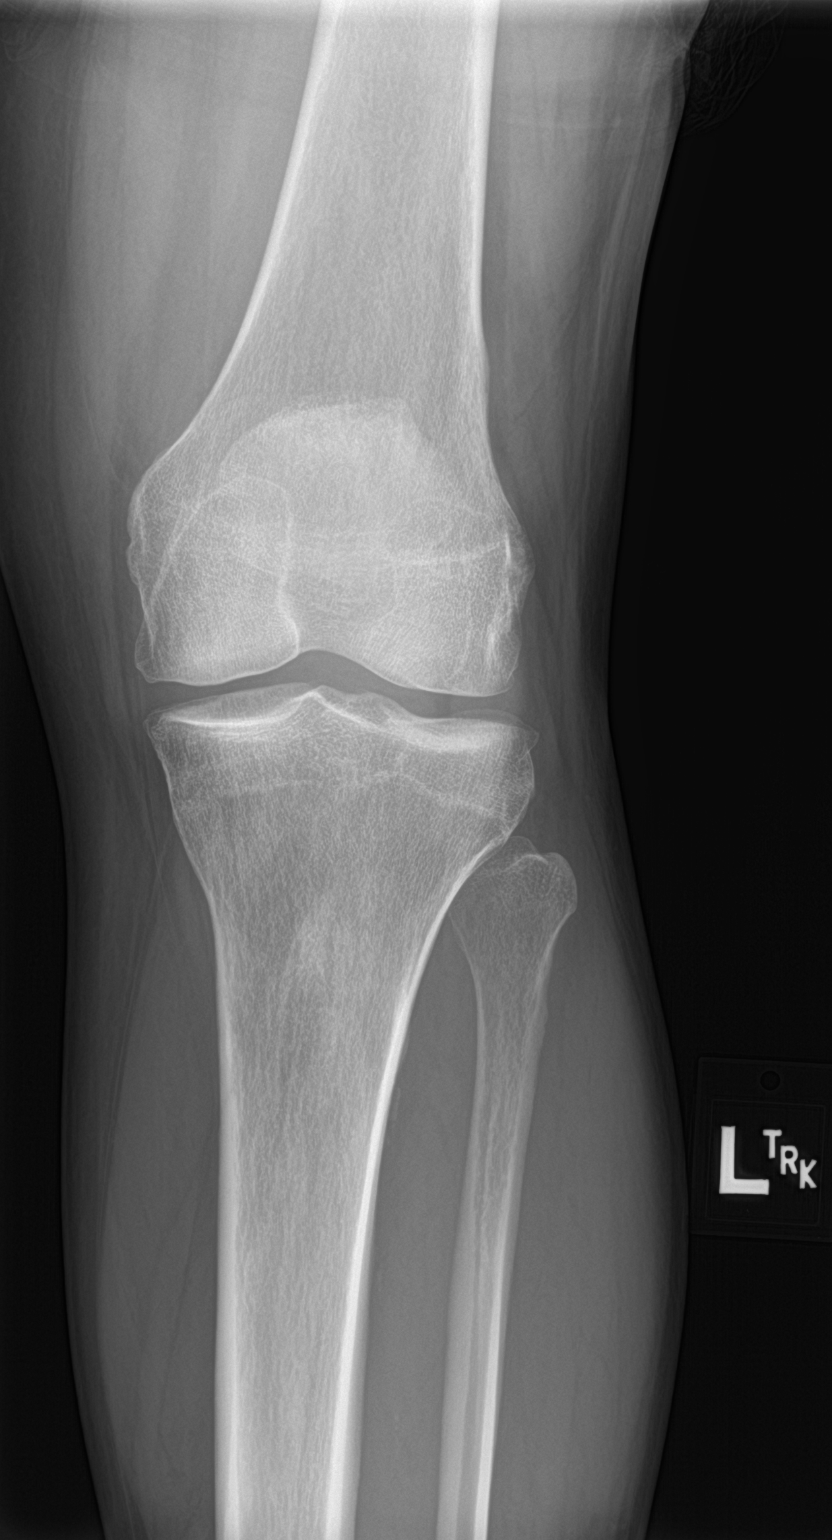

[knee lat]
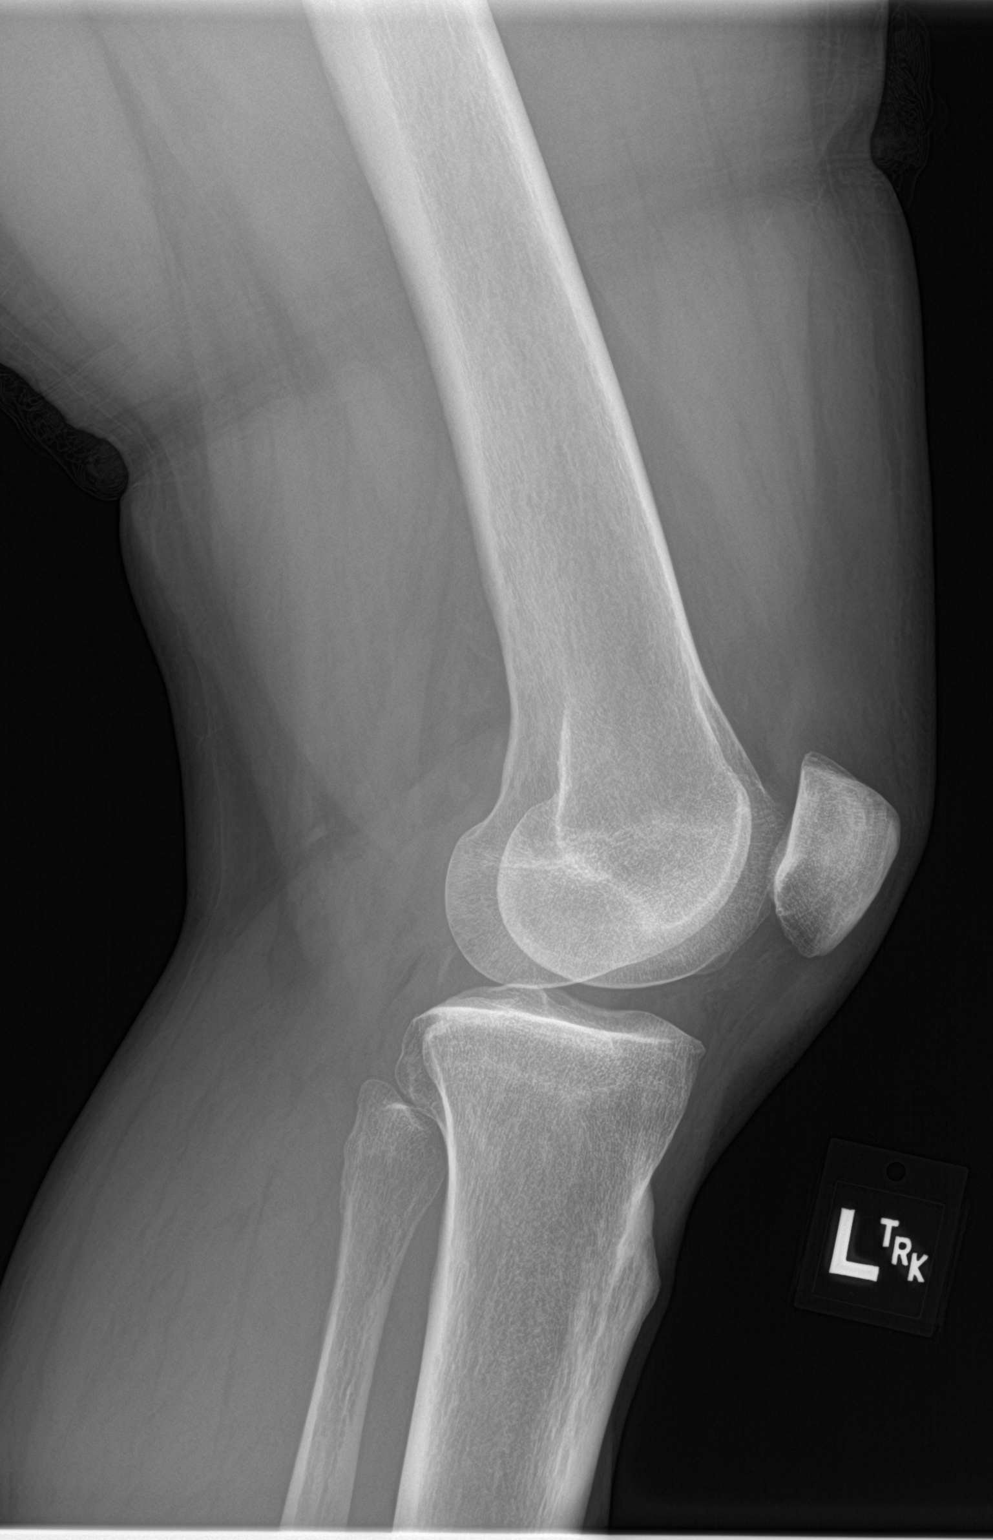

[knee obl (1 of 2)]
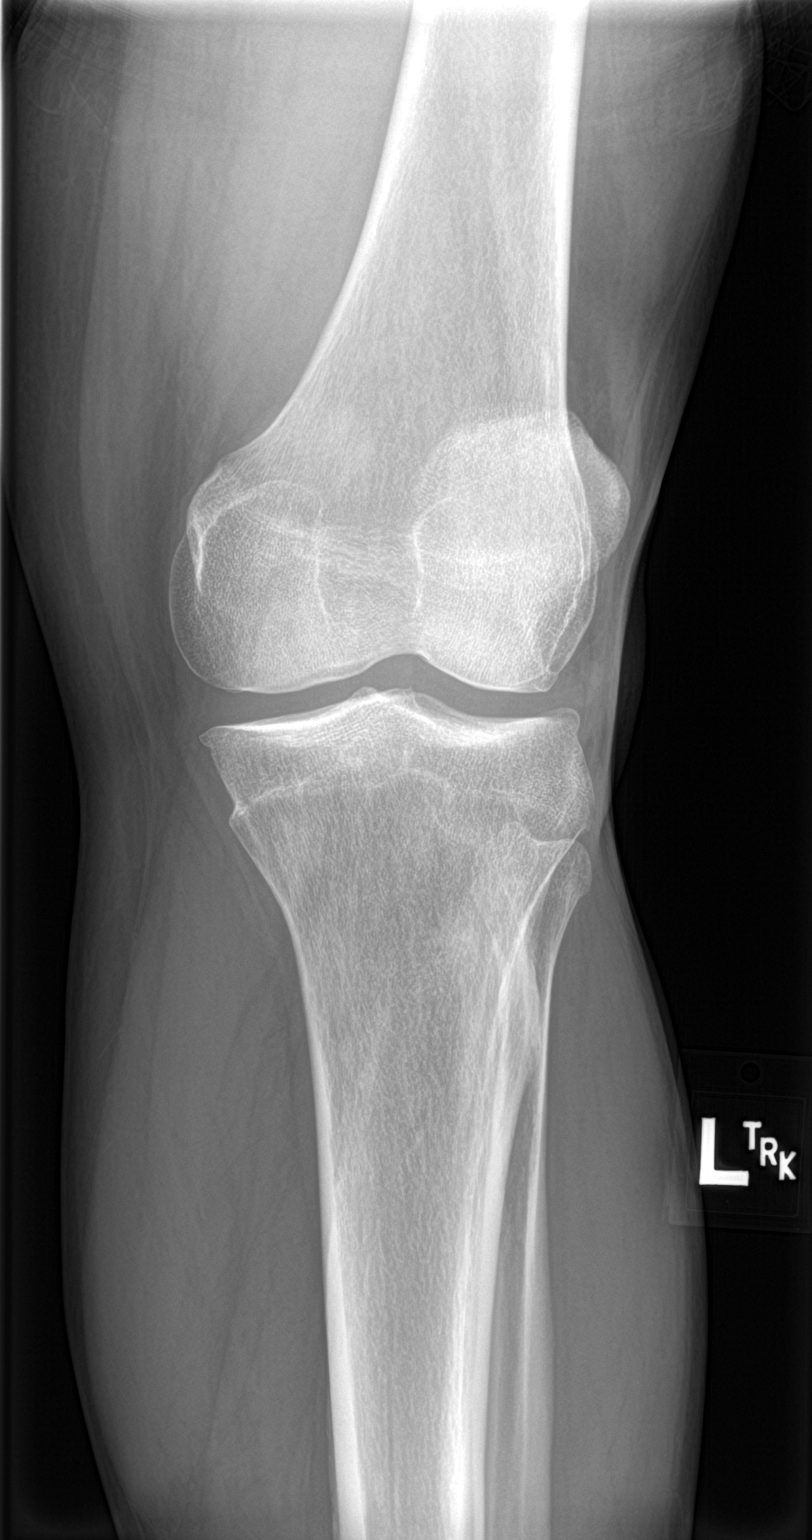

[knee obl (2 of 2)]
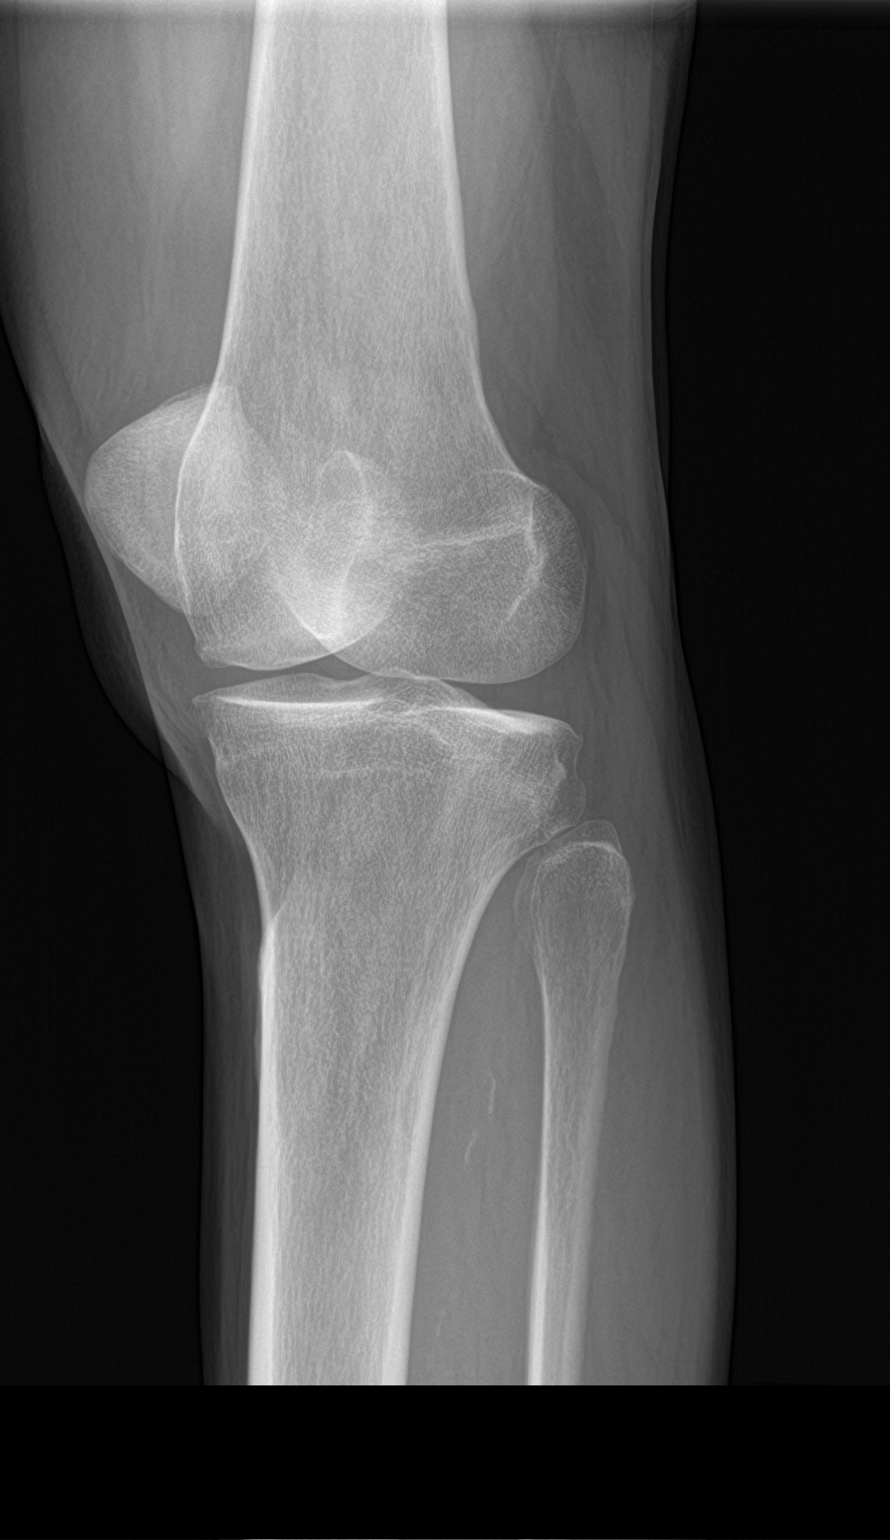

[4 of 4 positions shown; findings below may reference images not displayed]

FINDINGS: Frontal, lateral, and bilateral oblique views were obtained. No
fracture or dislocation. No joint effusion. Joint spaces appear
normal. No erosive change.
IMPRESSION: No fracture, dislocation, or joint effusion. No evident arthropathy.

## 2021-03-25 NOTE — Progress Notes (Signed)
NEUROLOGY FOLLOW UP OFFICE NOTE  Nyelah Emmerich [id number]  Assessment/Plan:    Bilateral embolic infarcts of unknown source, concern for cardiac source. Right hemispheric embolic infarcts with right ICA occlusion Type 2 diabetes mellitus - improving Hypertension Hyperlipidemia History of tobacco use disorder  1.Secondary stroke prevention as managed by PCP: - Plavix 75mg  daily - Atorvastatin 80mg  daily.  LDL goal less than 70. - Glycemic control.  Hgb A1c goal less than 7 - Normotensive blood pressure 2. Currently with implantable loop recorder to evaluate for a fib 3.  Follow up 9 months  Subjective:  Rosalba Totty is a 40 year old right-handed female with HTN and diabetes who follows up for stroke.  She is accompanied by her mother who provides collateral history.   UPDATE: Current medications:  Plavix 75mg , atorvastatin 80mg , metformin, metoprolol and Diovan   14 day cardiac event monitoring in January revealed occasional PACs and rare PVCs but no atrial fibrillation or heart block.  Currently with implantable loop recorder.  No a fib documented thus far.    Labs from March showed LDL 71 and Hgb A1c of 8 (down from 8.9).  Followed up with vascular surgery.  Carotid ultrasound on 01/22/2021 again demonstrated total occlusion of right ICA and 1-39% stenosis of left ICA with bilateral antegrade flow of vertebral arteries.   No longer smoking.  Feeling well thus far    HISTORY: She was admitted to [facility] on 03/03/2020 for 3 day history of left arm numbness and tingling and inability to grip with her left hand.  MRI of brain showed scattered multifocal subcentimeter acute ischemic infarcts involving the right frontal, parietal and temporal lobes and subcentimeter infarcts in left occipital lobe and left cerebellum.  CTA of head and neck showed narrow and tortuous bilateral cervical internal carotid arteries with mild dilation just inferior to the skull base  concerning for fibromuscular dysplasia or atherosclerotic disease.  2D echocardiogram showed EF 60-65% with no cardiac source of emboli.  Labs showed LDL 202, Hgb A1c 10.5, negative Covid test, and UDS positive for cocaine and THC.  She was on ASA 325mg  daily prior to admission and was discharged on ASA 81mg  and Plavix 75mg  daily for 3 weeks, followed by ASA alone (not changed to Plavix as patient had been noncompliant on ASA).  She was on Lipitor 40mg  daily prior to admission and continued at discharge as she had been noncompliant.  Further testing for cardiac source of emboli, including TEE and loop recorder, was not performed as she was thought to not be a good candidate for anticoagulation due to noncompliance.     On 04/14/2020, she developed worsening left arm and new left leg weakness.  She was seen in the hospital the following day.  MRI of brain showed new acute infarcts in the right cerebral hemisphere and small infarct in right pons.  CTA of head and neck showed new acute proximal right ICA occlusion with distal reconstitution at the supraclinoid segment via collateral flow across the circle-of-Willis.  Right MCA is perfused although attenuated as compared to the contralateral left MCA.  COVID testing was negative.  She was advised to remain on dual antiplatelet therapy for 3 months followed by Plavix alone.    PAST MEDICAL HISTORY: Past Medical History:  Diagnosis Date   Diabetes mellitus without complication (HCC)    Hyperlipidemia    Hypertension    Loop recorder [employer] 12/14/2020 12/14/2020   Scheduled Remote loop recorder check 12/14/2020: NSR. New  implant.    Stroke [facility])    TIA (transient ischemic attack) 01/22/2019    MEDICATIONS: Current Outpatient Medications on File Prior to Visit  Medication Sig Dispense Refill   atorvastatin (LIPITOR) 40 MG tablet Take 40 mg by mouth at bedtime.     clopidogrel (PLAVIX) 75 MG tablet Take 1 tablet (75 mg total) by mouth daily. 30 tablet 0    Empagliflozin (JARDIANCE PO) Take 10 mg by mouth at bedtime.     metFORMIN (GLUCOPHAGE) 1000 MG tablet Take 1 tablet (1,000 mg total) by mouth 2 (two) times daily. 60 tablet 0   metoprolol succinate (TOPROL XL) 25 MG 24 hr tablet Take 1 tablet (25 mg total) by mouth daily. Hold if top blood pressure number less than 100 mmHg or heart rate less than 60 bpm (pulse). 30 tablet 2   nitroGLYCERIN (NITROSTAT) 0.4 MG SL tablet Place 1 tablet (0.4 mg total) under the tongue every 5 (five) minutes as needed for chest pain. If you require more than two tablets five minutes apart go to the nearest ER via EMS. 30 tablet 0   valsartan-hydrochlorothiazide (DIOVAN-HCT) 80-12.5 MG tablet Take 1 tablet by mouth daily.     No current facility-administered medications on file prior to visit.    ALLERGIES: No Known Allergies  FAMILY HISTORY: Family History  Problem Relation Age of Onset   Diabetes Other    Hypertension Maternal Grandmother       Objective:  Blood pressure 99/63, pulse 82, height 5\' 7"  (1.702 m), weight 207 lb (93.9 kg), SpO2 97 %. General: No acute distress.  Patient appears well-groomed.   Head:  Normocephalic/atraumatic Eyes:  Fundi examined but not visualized Neck: supple, no paraspinal tenderness, full range of motion Heart:  Regular rate and rhythm Lungs:  Clear to auscultation bilaterally Back: No paraspinal tenderness Neurological Exam: alert and oriented to person, place, and time. Speech fluent and not dysarthric, language intact.  Right gaze preference.  Left lower facial weakness.  Otherwise, CN II-XII intact. Increased tone in proximal left upper extremity and proximal left lower extremity..  Left upper and lower extremity hemiplegia except 4-/5 left hip flexion;  5/5 right upper and lower extremities.  Sensation to light touch, temperature and vibration sensation reduced in left side.  Deep tendon reflexes 3+ left upper and lower extremities, 2+ right upper and lower  extremities.  Finger to nose intact.  In wheelchair.  Gait testing deferred.     , DO  CC: Shon Millet, FNP

## 2021-03-26 ENCOUNTER — Encounter: Payer: Self-pay | Admitting: Neurology

## 2021-03-26 ENCOUNTER — Ambulatory Visit: Payer: Medicaid Other | Admitting: Neurology

## 2021-03-26 ENCOUNTER — Other Ambulatory Visit: Payer: Self-pay

## 2021-03-26 VITALS — BP 99/63 | HR 82 | Ht 67.0 in | Wt 207.0 lb

## 2021-03-26 DIAGNOSIS — I69354 Hemiplegia and hemiparesis following cerebral infarction affecting left non-dominant side: Secondary | ICD-10-CM

## 2021-03-26 DIAGNOSIS — E785 Hyperlipidemia, unspecified: Secondary | ICD-10-CM

## 2021-03-26 DIAGNOSIS — E089 Diabetes mellitus due to underlying condition without complications: Secondary | ICD-10-CM

## 2021-03-26 DIAGNOSIS — I10 Essential (primary) hypertension: Secondary | ICD-10-CM | POA: Diagnosis not present

## 2021-03-26 DIAGNOSIS — I63231 Cerebral infarction due to unspecified occlusion or stenosis of right carotid arteries: Secondary | ICD-10-CM

## 2021-03-26 NOTE — Patient Instructions (Signed)
No change to medications Plavix 75mg  daily Atorvastatin 80mg  daily Continue blood pressure medications and metformin

## 2021-04-03 ENCOUNTER — Other Ambulatory Visit: Payer: Self-pay | Admitting: Cardiology

## 2021-04-03 DIAGNOSIS — R072 Precordial pain: Secondary | ICD-10-CM

## 2021-04-03 DIAGNOSIS — R9439 Abnormal result of other cardiovascular function study: Secondary | ICD-10-CM

## 2021-04-22 ENCOUNTER — Other Ambulatory Visit (HOSPITAL_COMMUNITY)
Admission: RE | Admit: 2021-04-22 | Discharge: 2021-04-22 | Disposition: A | Discharge status: Home / Self Care | Payer: Medicaid Other | Source: Ambulatory Visit | Attending: Family Medicine | Admitting: Family Medicine

## 2021-04-22 DIAGNOSIS — Z124 Encounter for screening for malignant neoplasm of cervix: Secondary | ICD-10-CM | POA: Diagnosis present

## 2021-04-26 LAB — CYTOLOGY - PAP
Chlamydia: NEGATIVE
Comment: NEGATIVE
Comment: NEGATIVE
Comment: NEGATIVE
Comment: NEGATIVE
Comment: NORMAL
Diagnosis: NEGATIVE
HSV1: NEGATIVE
HSV2: NEGATIVE
High risk HPV: NEGATIVE
Neisseria Gonorrhea: NEGATIVE
Trichomonas: NEGATIVE

## 2021-07-22 ENCOUNTER — Ambulatory Visit: Payer: Medicaid Other | Admitting: Cardiology

## 2021-08-14 ENCOUNTER — Ambulatory Visit: Payer: Medicaid Other | Admitting: Cardiology

## 2021-08-14 ENCOUNTER — Other Ambulatory Visit: Payer: Self-pay

## 2021-08-14 ENCOUNTER — Encounter: Payer: Self-pay | Admitting: Cardiology

## 2021-08-14 VITALS — BP 118/86 | HR 92 | Temp 98.6°F | Resp 16 | Ht 67.0 in | Wt 224.8 lb

## 2021-08-14 DIAGNOSIS — I63231 Cerebral infarction due to unspecified occlusion or stenosis of right carotid arteries: Secondary | ICD-10-CM

## 2021-08-14 DIAGNOSIS — R9439 Abnormal result of other cardiovascular function study: Secondary | ICD-10-CM

## 2021-08-14 DIAGNOSIS — Z95818 Presence of other cardiac implants and grafts: Secondary | ICD-10-CM

## 2021-08-14 DIAGNOSIS — E7800 Pure hypercholesterolemia, unspecified: Secondary | ICD-10-CM

## 2021-08-14 DIAGNOSIS — E1165 Type 2 diabetes mellitus with hyperglycemia: Secondary | ICD-10-CM

## 2021-08-14 DIAGNOSIS — I10 Essential (primary) hypertension: Secondary | ICD-10-CM

## 2021-08-14 NOTE — Progress Notes (Signed)
ID:  Ebony Matthews, DOB 1981/07/09, MRN [id number]  PCP:  Iona Beard, MD  Cardiologist:  Rex Kras, DO, [facility] (established care 11/30/2020) Cardiology EP: Dr. Cristopher Peru.   Date: 08/14/21 Last Office Visit: 01/18/2021  Chief Complaint  Patient presents with   Chest Pain   Loop recorder    Follow-up    HPI  Ebony Matthews is a 40 y.o. female who presents to the office with a chief complaint of " 53-monthfollow-up for reevaluation of chest pain and review loop recorder interrogation." Patient's past medical history and cardiovascular risk factors include: History of multiple strokes with residual deficits, right ICA occlusion, hypertension, diabetes, history of cocaine use, former smoker.  She is referred to the office at the request [place].  She  is accompanied by her  ALarwance Roteat today's office visit.  She provides verbal consent in regards to discussing her medical information in their presence.  Given her history of 2 strokes in the recent past with residual deficits.  Based on electronic medical records initial stroke was considered to be cryptogenic etiology however the second stroke that happened in July 2021 was most likely secondary to right ICA occlusion.  In the past patient has been evaluated by cardiac electrophysiologist Dr. GCrissie Sicklesfor loop recorder implantation the patient had failed to follow-up.  Surface echocardiograms in the past were negative for PFO.  In the past transesophageal echocardiogram was also recommended but not done due COVID-19 pandemic, her cocaine use, and noncompliance with medical therapy due to multiple cardiovascular comorbidities, findings suggestive of poor insight and medical health.  Patient subsequently underwent loop recorder implantation for detection of possible atrial fibrillation given her cryptogenic strokes.  Recent loop recorder interrogation notes no evidence of  atrial fibrillation.  In the recent past she is also undergone ischemic work-up given her risk factors as outlined above.  She was noted to have reversible ischemia in the LAD/LCx distribution.  However, since the patient remained asymptomatic she favored up titration of GDMT/antianginal therapy as opposed to invasive angiography.  She now presents for 644-monthollow-up.  No reoccurrence of chest pain.  No use of sublingual nitroglycerin tablets.  She is compliant with her current medical therapy.  Given her prior stroke she is currently ambulating with a cane which is resulted in an activity and she has gained approximately 24 pounds over the last 6 months.  FUNCTIONAL STATUS: Limited as she ambulates with either a cane or walker after her strokes.  ALLERGIES: No Known Allergies  MEDICATION LIST PRIOR TO VISIT: Current Meds  Medication Sig   atorvastatin (LIPITOR) 40 MG tablet Take 40 mg by mouth at bedtime.   clopidogrel (PLAVIX) 75 MG tablet Take 1 tablet (75 mg total) by mouth daily.   Empagliflozin (JARDIANCE PO) Take 10 mg by mouth at bedtime.   metFORMIN (GLUCOPHAGE) 1000 MG tablet Take 1 tablet (1,000 mg total) by mouth 2 (two) times daily.   metoprolol succinate (TOPROL-XL) 25 MG 24 hr tablet TAKE 1 TABLET BY MOUTH ONCE DAILY.HOLD IF TOP BLOOD PRESSURE NUMBER LESS THAN 100MMHG OR HEART RATE LESS THAN 60 BPM(PULSE)   nitroGLYCERIN (NITROSTAT) 0.4 MG SL tablet Place 1 tablet (0.4 mg total) under the tongue every 5 (five) minutes as needed for chest pain. If you require more than two tablets five minutes apart go to the nearest ER via EMS.   valsartan-hydrochlorothiazide (DIOVAN-HCT) 80-12.5 MG tablet Take 1 tablet by mouth  daily.     PAST MEDICAL HISTORY: Past Medical History:  Diagnosis Date   Diabetes mellitus without complication ([place])    Hyperlipidemia    Hypertension    Loop recorder [employer] 12/14/2020 12/14/2020   Scheduled Remote loop recorder check 12/14/2020: NSR. New  implant.    Stroke [facility])    TIA (transient ischemic attack) 01/22/2019    PAST SURGICAL HISTORY: Past Surgical History:  Procedure Laterality Date   NO PAST SURGERIES      FAMILY HISTORY: The patient family history includes Diabetes in an other family member; Hypertension in her maternal grandmother.  SOCIAL HISTORY:  The patient  reports that she quit smoking about 22 months ago. Her smoking use included cigarettes. She has a 15.00 pack-year smoking history. She has never used smokeless tobacco. She reports that she does not currently use drugs after having used the following drugs: Marijuana and Cocaine. She reports that she does not drink alcohol.  REVIEW OF SYSTEMS: Review of Systems  Constitutional: Negative for chills and fever.  HENT:  Negative for hoarse voice and nosebleeds.   Eyes:  Negative for discharge, double vision and pain.  Cardiovascular:  Negative for chest pain, claudication, dyspnea on exertion, leg swelling, near-syncope, orthopnea, palpitations, paroxysmal nocturnal dyspnea and syncope.  Respiratory:  Negative for hemoptysis and shortness of breath.   Musculoskeletal:  Negative for muscle cramps and myalgias.  Gastrointestinal:  Negative for abdominal pain, constipation, diarrhea, hematemesis, hematochezia, melena, nausea and vomiting.  Neurological:  Positive for focal weakness (LUE weakness and LLE weakness (wears a brace). ). Negative for dizziness and light-headedness.   PHYSICAL EXAM: Vitals with BMI 08/14/2021 03/26/2021 01/22/2021  Height '5\' 7"'  '5\' 7"'  '5\' 7"'   Weight 224 lbs 13 oz 207 lbs 200 lbs  BMI 35.2 44.01 02.72  Systolic 536 99 644  Diastolic 86 63 77  Pulse 92 82 92    CONSTITUTIONAL: Appears older than stated age, hemodynamically stable, no acute distress.   SKIN: Skin is warm and dry. No rash noted. No cyanosis. No pallor. No jaundice HEAD: Normocephalic and atraumatic.  EYES: No scleral icterus MOUTH/THROAT: Moist oral membranes.  NECK:  No JVD present. No thyromegaly noted. No carotid bruits  LYMPHATIC: No visible cervical adenopathy.  CHEST Normal respiratory effort. No intercostal retractions  LUNGS: Clear to auscultation bilaterally. No stridor. No wheezes. No rales.  CARDIOVASCULAR: Regular, tachycardic, positive S1-S2, no murmurs rubs or gallops appreciated. ABDOMINAL: Soft, nontender, nondistended, positive bowel sounds in all 4 quadrants, no apparent ascites.  EXTREMITIES: No peripheral edema, brace present over the left lower extremity.  HEMATOLOGIC: No significant bruising NEUROLOGIC: Oriented to person, place, and time.  Strength over the left upper and lower extremity 3+ and preserved involving the right upper and lower extremity PSYCHIATRIC: Normal mood and affect. Normal behavior. Cooperative  Radiology/Studies:    Ct Angio Head W Or Wo Contrast Result Date: 01/22/2019 CLINICAL DATA:  Right facial droop, slurred speech, right arm numbness. Diabetes and hypertension EXAM: CT ANGIOGRAPHY HEAD AND NECK TECHNIQUE: Multidetector CT imaging of the head and neck was performed using the standard protocol during bolus administration of intravenous contrast. Multiplanar CT image reconstructions and MIPs were obtained to evaluate the vascular anatomy. Carotid stenosis measurements (when applicable) are obtained utilizing NASCET criteria, using the distal internal carotid diameter as the denominator. CONTRAST:  31m OMNIPAQUE IOHEXOL 350 MG/ML SOLN COMPARISON:  None. FINDINGS: CT HEAD FINDINGS Brain: No evidence of acute infarction, hemorrhage, hydrocephalus, extra-axial collection or mass lesion/mass effect. Vascular: Negative  for hyperdense vessel Skull: Negative Sinuses: Negative Orbits: Negative Review of the MIP images confirms the above findings CTA NECK FINDINGS Aortic arch: Standard branching. Imaged portion shows no evidence of aneurysm or dissection. No significant stenosis of the major arch vessel origins. Right carotid  system: Right carotid bifurcation widely patent. Mild fusiform dilatation of the distal right internal carotid artery below the skull base with mild irregularity, question mild FMD. No significant stenosis Left carotid system: Similar findings to the right carotid with mild fusiform dilatation of the internal carotid artery below the skull base. Mild narrowing at the origin of the left internal carotid artery. No calcification or aneurysm. No significant stenosis. Vertebral arteries: Both vertebral arteries widely patent to the basilar without stenosis. Skeleton: No acute skeletal abnormality. Poor dentition with numerous caries. Other neck: Negative for soft tissue mass or adenopathy. Upper chest: Negative Review of the MIP images confirms the above findings CTA HEAD FINDINGS Anterior circulation: Mild narrowing of the right cavernous carotid compared to the right internal carotid artery below the skull base. Right anterior and middle cerebral arteries widely patent Diffuse narrowing left cavernous carotid with mild-to-moderate focal stenosis proximal cavernous carotid. Left anterior and middle cerebral arteries widely patent. Posterior circulation: Both vertebral arteries patent to the basilar. PICA patent bilaterally. Basilar widely patent. Superior cerebellar and posterior cerebral arteries widely patent. Venous sinuses: Patent Anatomic variants: None Delayed phase: Normal enhancement postcontrast administration. Review of the MIP images confirms the above findings IMPRESSION: 1. Negative CT head 2. Negative for emergent large vessel occlusion 3. Mild fusiform dilatation of the internal carotid artery below the skull base with subsequent narrowing to the cavernous carotid bilaterally. Question FMD. No intracranial aneurysm. 4. These results were called by telephone at the time of interpretation on 01/22/2019 at 9:36 pm to Dr. Kerney Elbe , who verbally acknowledged these results. Electronically Signed   By:  Franchot Gallo M.D.   On: 01/22/2019 21:36    Dg Chest 2 View Result Date: 01/23/2019 CLINICAL DATA:  Dizziness EXAM: CHEST - 2 VIEW COMPARISON:  07/29/2016 FINDINGS: The heart size and mediastinal contours are within normal limits. Both lungs are clear. The visualized skeletal structures are unremarkable. IMPRESSION: No active cardiopulmonary disease. Electronically Signed   By: Ulyses Jarred M.D.   On: 01/23/2019 02:35     Mr Brain Wo Contrast Result Date: 01/23/2019 CLINICAL DATA:  Near syncopal event with slurred speech and right upper extremity numbness. Right facial droop. Symptoms resolved after 5 minutes. EXAM: MRI HEAD WITHOUT CONTRAST TECHNIQUE: Multiplanar, multiecho pulse sequences of the brain and surrounding structures were obtained without intravenous contrast. COMPARISON:  CTA head neck 01/22/2019 FINDINGS: BRAIN: There is multifocal acute ischemia, mostly within the left hemisphere, with punctate foci along the left precentral gyrus and within the posterior left parietal lobe. There is a single focus in the right hemispheric white matter and a small linear focus in the left occipital lobe. The midline structures are normal. No midline shift or other mass effect. Multifocal white matter hyperintensity, most commonly due to chronic ischemic microangiopathy. The cerebral and cerebellar volume are age-appropriate. No hydrocephalus. Susceptibility-sensitive sequences show no chronic microhemorrhage or superficial siderosis. No mass lesion. VASCULAR: The major intracranial arterial and venous sinus flow voids are normal. SKULL AND UPPER CERVICAL SPINE: Calvarial bone marrow signal is normal. There is no skull base mass. Visualized upper cervical spine and soft tissues are normal. SINUSES/ORBITS: No fluid levels or advanced mucosal thickening. No mastoid or middle ear effusion. The  orbits are normal. IMPRESSION: 1. Multifocal acute ischemia, predominantly in the left hemisphere, although there is a  single focus within the right frontal white matter. Punctate ischemic foci along the left motor strip are compatible with reported right upper extremity symptoms. 2. No hemorrhage or mass effect. 3. Mild findings of chronic ischemic microangiopathy. Electronically Signed   By: Ulyses Jarred M.D.   On: 01/23/2019 02:35   MRI brain without contrast 03/03/2020: 1. Scattered multifocal subcentimeter acute ischemic infarcts involving the right cerebral hemisphere as above. Few additional subcentimeter infarcts involve the left occipital lobe and inferior right cerebellum. No associated hemorrhage or mass effect. 2. Small chronic infarct involving the right middle cerebellar peduncle, chronic in appearance, but new as compared to previous. 3. Underlying mild to moderate chronic microvascular ischemic disease, mildly progressed from previous.   CTA neck with and without contrast: 04/15/2020. 1. Acute proximal right ICA occlusion, new from previous. Distal reconstitution at the supraclinoid segment via collateral flow across the circle-of-Willis. Right MCA is perfused although attenuated as compared to the contralateral left. 2. Otherwise stable CTA of the head and neck. No other large vessel occlusion, hemodynamically significant stenosis, or other acute vascular abnormality. 3. 4 mm left upper lobe nodule, indeterminate. No follow-up needed if patient is low-risk. Non-contrast chest CT can be considered in 12 months if patient is high-risk. This recommendation follows the consensus statement: Guidelines for Management of Incidental Pulmonary Nodules Detected on CT Images: From the Fleischner Society 2017; Radiology 2017; 284:228-243.  MRI brain with and without contrast 04/15/2020: Multiple new acute infarcts in the right cerebral hemisphere. Small acute infarct of the right pons.  CARDIAC DATABASE: EKG: 08/14/2021: Normal sinus rhythm, 94 bpm, consider old anteroseptal infarct, without underlying injury  pattern.    Echocardiogram: 03/04/2020:  1. Left ventricular ejection fraction, by estimation, is 60 to 65%. The  left ventricle has normal function. The left ventricle has no regional  wall motion abnormalities. There is mild left ventricular hypertrophy.  Left ventricular diastolic parameters  are consistent with Grade I diastolic dysfunction (impaired relaxation).   2. Right ventricular systolic function is normal. The right ventricular  size is normal.   3. The mitral valve is normal in structure. No evidence of mitral valve  regurgitation. No evidence of mitral stenosis.   4. The aortic valve is normal in structure. Aortic valve regurgitation is  not visualized. No aortic stenosis is present.   5. The inferior vena cava is normal in size with greater than 50%  respiratory variability, suggesting right atrial pressure of 3 mmHg.   Conclusion(s)/Recommendation(s): No intracardiac source of embolism  detected on this transthoracic study. A transesophageal echocardiogram is  recommended to exclude cardiac source of embolism if clinically indicated.   Stress Testing: Lexiscan Sestamibi stress test 12/10/2020: Abnormal nuclear stress test: 1 Day Rest/Stress Protocol. Stress EKG is non-diagnostic, as this is pharmacological stress test using Lexiscan. Medium size, moderate intensity, reversible perfusion defect suggestive of ischemia in the LCx distribution. Small size, moderate intensity, reversible perfusion defect suggestive of ischemia in the LAD distribution. Small size, fixed perfusion defect involving the basal to mid inferior segments most likely secondary to soft tissue attenuation artifact with possible small degree of reversible ischemia involving the basal to mid inferoseptal segments cannot be ruled out. Calculated LVEF 64% without regional wall motion abnormalities. Clinical correlation required.  Heart Catheterization: None   Zio Patch Extended out patient EKG  monitoring 14 days starting 10/10/2020: Predominant rhythm is normal sinus rhythm.  Minimum heart rate 67 and maximum heart rate 160 bpm with an average heart rate of 98 bpm. There were occasional PACs and atrial couplets.  There were rare PVCs.  There was no atrial fibrillation. There was no heart block, no symptomatic events recorded  Remote loop recorder transmission 07/18/2021:  Predominant rhythm is normal sinus rhythm.  No significant arrhythmias noted.  There were no patient activated events.   LABORATORY DATA: CBC Latest Ref Rng & Units 04/23/2020 04/15/2020 03/03/2020  WBC 4.0 - 10.5 K/uL 7.5 8.6 -  Hemoglobin 12.0 - 15.0 g/dL 13.9 14.1 15.0  Hematocrit 36.0 - 46.0 % 43.7 43.2 44.0  Platelets 150 - 400 K/uL 199 214 -    CMP Latest Ref Rng & Units 04/23/2020 04/15/2020 03/03/2020  Glucose 70 - 99 mg/dL 200(H) 378(H) 319(H)  BUN 6 - 20 mg/dL '7 7 11  ' Creatinine 0.44 - 1.00 mg/dL 0.81 0.89 0.70  Sodium 135 - 145 mmol/L 133(L) 135 135  Potassium 3.5 - 5.1 mmol/L 3.9 3.7 4.3  Chloride 98 - 111 mmol/L 101 100 100  CO2 22 - 32 mmol/L 21(L) 22 -  Calcium 8.9 - 10.3 mg/dL 9.5 9.7 -  Total Protein 6.5 - 8.1 g/dL 7.1 8.2(H) -  Total Bilirubin 0.3 - 1.2 mg/dL 0.7 0.8 -  Alkaline Phos 38 - 126 U/L 74 117 -  AST 15 - 41 U/L 23 24 -  ALT 0 - 44 U/L 21 20 -    Lipid Panel  Lab Results  Component Value Date   CHOL 258 (H) 03/04/2020   HDL 29 (L) 03/04/2020   LDLCALC 202 (H) 03/04/2020   TRIG 133 03/04/2020   CHOLHDL 8.9 03/04/2020   BMP No results for input(s): NA, K, CL, CO2, GLUCOSE, BUN, CREATININE, CALCIUM, GFRNONAA, GFRAA in the last 8760 hours.   HEMOGLOBIN A1C Lab Results  Component Value Date   HGBA1C 10.7 (H) 03/04/2020   MPG 260.39 03/04/2020    External Labs: Collected: 11/28/2020 provided by PCP during the office visit Creatinine 0.81 mg/dL. eGFR: 105 mL/min per 1.73 m Lipid profile: Total cholesterol 122, triglycerides 91, HDL 34, LDL 71, non-HDL 88 Sodium 137,  potassium 4.6, chloride 103, bicarbonate 24, AST 18, ALT 35, alkaline phosphatase 76 Hemoglobin 12.3 g/dL, hematocrit 38.9% Hemoglobin A1c: 8  IMPRESSION:    ICD-10-CM   1. Abnormal stress test  R94.39 EKG 12-Lead    2. Hx of right proximal ICA occlusion  I63.231     3. Essential hypertension, benign  I10     4. Type 2 diabetes mellitus with hyperglycemia, without long-term current use of insulin (HCC)  E11.65     5. Hypercholesteremia  E78.00     6. Status post placement of implantable loop recorder  Z95.818     7. Class 2 severe obesity due to excess calories with serious comorbidity and body mass index (BMI) of 35.0 to 35.9 in adult [facility])  E66.01    Z68.35        RECOMMENDATIONS: Ebony Matthews is a 40 y.o. female whose past medical history and cardiac risk factors include: Cryptogenic stroke w/ left-sided deficits, diabetes mellitus, hypertension, back pain, smoker, marijuana and cocaine use.  Abnormal nuclear stress test: Given her multiple cardiovascular risk factors she underwent an ischemic evaluation which noted reversible ischemia.  However, since she was asymptomatic patient favored to continue medical therapy. She now presents for 63-monthfollow-up and is doing well from a cardiovascular standpoint.   She denies angina pectoris at  rest or with effort related activities. No use of sublingual nitroglycerin tablets. Educated on the importance of increasing physical activity to facilitate weight loss  and if she has effort related symptoms of chest pain or shortness of breath she is asked to call the office for sooner appointment or go to the closest ER via EMS for an expedited evaluation.  Patient and her aunt verbalized understanding.  History of multiple strokes: Currently follows with neurology. Has not had a chance to follow with hematology oncology for possible hypercoagulable state given her history of multiple strokes and miscarriages early in life.  I have asked  her to discuss this further with PCP and possibly obtain a referral  Status post loop recorder implantation-based on most recent interrogation no evidence of atrial fibrillation. Educated on improving her modifiable cardiovascular risk factors for secondary prevention. Medications reconciled.  History of right ICA occlusion status post stroke: Currently on Plavix and statin therapy. Has establish care with vascular surgery in April 2022.  Has a follow-up appointment in 2 years with repeat carotid duplex prior to the office visit.  FINAL MEDICATION LIST END OF ENCOUNTER: No orders of the defined types were placed in this encounter.    Current Outpatient Medications:    atorvastatin (LIPITOR) 40 MG tablet, Take 40 mg by mouth at bedtime., Disp: , Rfl:    clopidogrel (PLAVIX) 75 MG tablet, Take 1 tablet (75 mg total) by mouth daily., Disp: 30 tablet, Rfl: 0   Empagliflozin (JARDIANCE PO), Take 10 mg by mouth at bedtime., Disp: , Rfl:    metFORMIN (GLUCOPHAGE) 1000 MG tablet, Take 1 tablet (1,000 mg total) by mouth 2 (two) times daily., Disp: 60 tablet, Rfl: 0   metoprolol succinate (TOPROL-XL) 25 MG 24 hr tablet, TAKE 1 TABLET BY MOUTH ONCE DAILY.HOLD IF TOP BLOOD PRESSURE NUMBER LESS THAN 100MMHG OR HEART RATE LESS THAN 60 BPM(PULSE), Disp: 90 tablet, Rfl: 1   nitroGLYCERIN (NITROSTAT) 0.4 MG SL tablet, Place 1 tablet (0.4 mg total) under the tongue every 5 (five) minutes as needed for chest pain. If you require more than two tablets five minutes apart go to the nearest ER via EMS., Disp: 30 tablet, Rfl: 0   valsartan-hydrochlorothiazide (DIOVAN-HCT) 80-12.5 MG tablet, Take 1 tablet by mouth daily., Disp: , Rfl:   Orders Placed This Encounter  Procedures   EKG 12-Lead    There are no Patient Instructions on file for this visit.   --Continue cardiac medications as reconciled in final medication list. --Return in about 6 months (around 02/24/2022) for Follow up. Or sooner if  needed. --Continue follow-up with your primary care physician regarding the management of your other chronic comorbid conditions.  Patient's questions and concerns were addressed to her satisfaction. She voices understanding of the instructions provided during this encounter.   This note was created using a voice recognition software as a result there may be grammatical errors inadvertently enclosed that do not reflect the nature of this encounter. Every attempt is made to correct such errors.  Rex Kras, [place], [facility]  Pager: [phone] Office: [phone]

## 2021-10-07 ENCOUNTER — Other Ambulatory Visit: Payer: Self-pay | Admitting: Cardiology

## 2021-10-07 DIAGNOSIS — R072 Precordial pain: Secondary | ICD-10-CM

## 2021-10-07 DIAGNOSIS — R9439 Abnormal result of other cardiovascular function study: Secondary | ICD-10-CM

## 2021-10-24 ENCOUNTER — Ambulatory Visit: Payer: Medicaid Other | Admitting: Podiatry

## 2021-10-24 ENCOUNTER — Encounter: Payer: Self-pay | Admitting: Podiatry

## 2021-10-24 ENCOUNTER — Other Ambulatory Visit: Payer: Self-pay

## 2021-10-24 DIAGNOSIS — E089 Diabetes mellitus due to underlying condition without complications: Secondary | ICD-10-CM | POA: Diagnosis not present

## 2021-10-24 DIAGNOSIS — B351 Tinea unguium: Secondary | ICD-10-CM | POA: Diagnosis not present

## 2021-10-24 DIAGNOSIS — M79675 Pain in left toe(s): Secondary | ICD-10-CM

## 2021-10-24 DIAGNOSIS — M79674 Pain in right toe(s): Secondary | ICD-10-CM

## 2021-10-24 DIAGNOSIS — D689 Coagulation defect, unspecified: Secondary | ICD-10-CM | POA: Diagnosis not present

## 2021-10-24 NOTE — Progress Notes (Signed)
This patient presents  to my office for at risk foot care.  This patient requires this care by a professional since this patient will be at risk due to having CVA, diabetes and coagulation defect.  Patient is taking plavix.  This patient is unable to cut nails herself since the patient cannot reach her nails.These nails are painful walking and wearing shoes.  She presents to the office with female caregiver.  This patient presents for at risk foot care today.  General Appearance  Alert, conversant and in no acute stress.  Vascular  Dorsalis pedis and posterior tibial  pulses are palpable  bilaterally.  Capillary return is within normal limits  bilaterally. Temperature is within normal limits  bilaterally.  Neurologic  Senn-Weinstein monofilament wire test diminished  bilaterally. Muscle power within normal limits bilaterally.  Nails Thick disfigured discolored nails with subungual debris  from hallux to fifth toes bilaterally. No evidence of bacterial infection or drainage bilaterally.  Orthopedic  No limitations of motion  feet .  No crepitus or effusions noted.  No bony pathology or digital deformities noted.  Skin  normotropic skin with no porokeratosis noted bilaterally.  No signs of infections or ulcers noted.     Onychomycosis  Pain in right toes  Pain in left toes  Consent was obtained for treatment procedures.   Mechanical debridement of nails 1-5  bilaterally performed with a nail nipper.  Filed with dremel without incident.    Return office visit                     Told patient to return for periodic foot care and evaluation due to potential at risk complications.   Helane Gunther DPM

## 2021-12-23 NOTE — Progress Notes (Signed)
? ?NEUROLOGY FOLLOW UP OFFICE NOTE ? ?Ebony Matthews ?[id number] ? ?Assessment/Plan:  ? ? Bilateral embolic infarcts of unknown source, concern for cardiac source. ?Left-sided hemiparesis as late effect of right hemispheric embolic infarcts with right ICA occlusion ?Type 2 diabetes mellitus - improving ?Hypertension ?Hyperlipidemia ?History of tobacco use disorder ?  ?1.Secondary stroke prevention as managed by PCP: ?- Plavix 75mg  daily ?- Atorvastatin 40mg  daily.  LDL goal less than 70. ?- Glycemic control.  Hgb A1c goal less than 7 ?- Normotensive blood pressure ?2. Currently with implantable loop recorder to evaluate for a fib ?3.  She saw Dr. at [facility] in April 2022 who recommended follow up in 2 years with repeat carotid ultrasound.  Recommended that they contact his office to schedule a follow up for April 2024. ?4.  Follow up in one year. ?  ?Subjective:  ?Ebony Matthews is a 41 year old right-handed female with HTN and diabetes who follows up for stroke.  She is accompanied by her aunt.   ?  ?UPDATE: ?Current medications:  Plavix 75mg , atorvastatin 40mg , metformin, Jardiance, metoprolol and Diovan-HCT ?  ?A fib has not yet been detected on loop recorder.  Overall, doing okay. ?  ?HISTORY: ?She was admitted to [facility] on 03/03/2020 for 3 day history of left arm numbness and tingling and inability to grip with her left hand.  MRI of brain showed scattered multifocal subcentimeter acute ischemic infarcts involving the right frontal, parietal and temporal lobes and subcentimeter infarcts in left occipital lobe and left cerebellum.  CTA of head and neck showed narrow and tortuous bilateral cervical internal carotid arteries with mild dilation just inferior to the skull base concerning for fibromuscular dysplasia or atherosclerotic disease.  2D echocardiogram showed EF 60-65% with no cardiac source of emboli.  Labs showed LDL 202, Hgb A1c 10.5, negative  Covid test, and UDS positive for cocaine and THC.  She was on ASA 325mg  daily prior to admission and was discharged on ASA 81mg  and Plavix 75mg  daily for 3 weeks, followed by ASA alone (not changed to Plavix as patient had been noncompliant on ASA).  She was on Lipitor 40mg  daily prior to admission and continued at discharge as she had been noncompliant.  Further testing for cardiac source of emboli, including TEE and loop recorder, was not performed as she was thought to not be a good candidate for anticoagulation due to noncompliance.   ?  ?On 04/14/2020, she developed worsening left arm and new left leg weakness.  She was seen in the hospital the following day.  MRI of brain showed new acute infarcts in the right cerebral hemisphere and small infarct in right pons.  CTA of head and neck showed new acute proximal right ICA occlusion with distal reconstitution at the supraclinoid segment via collateral flow across the circle-of-Willis.  Right MCA is perfused although attenuated as compared to the contralateral left MCA.  COVID testing was negative.  She was advised to remain on dual antiplatelet therapy for 3 months followed by Plavix alone.   ? ?14 day cardiac event monitoring in January 2022 revealed occasional PACs and rare PVCs but no atrial fibrillation or heart block.  Underwent implantable loop recorder.  Carotid ultrasound on 01/22/2021 again demonstrated total occlusion of right ICA and 1-39% stenosis of left ICA with bilateral antegrade flow of vertebral arteries.  ? ?She had an abnormal stress test.  As she was asymptomatic, cardiology recommended continued medical management.   ? ?  PAST MEDICAL HISTORY: ?Past Medical History:  ?Diagnosis Date  ? Diabetes mellitus without complication (HCC)   ? Hyperlipidemia   ? Hypertension   ? Loop recorder Biotronic 12/14/2020 12/14/2020  ? Scheduled Remote loop recorder check 12/14/2020: NSR. New implant.   ? Stroke [facility])   ? TIA (transient ischemic attack) 01/22/2019   ? ? ?MEDICATIONS: ?Current Outpatient Medications on File Prior to Visit  ?Medication Sig Dispense Refill  ? atorvastatin (LIPITOR) 40 MG tablet Take 40 mg by mouth at bedtime.    ? clopidogrel (PLAVIX) 75 MG tablet Take 1 tablet (75 mg total) by mouth daily. 30 tablet 0  ? Empagliflozin (JARDIANCE PO) Take 10 mg by mouth at bedtime.    ? metFORMIN (GLUCOPHAGE) 1000 MG tablet Take 1 tablet (1,000 mg total) by mouth 2 (two) times daily. 60 tablet 0  ? metoprolol succinate (TOPROL-XL) 25 MG 24 hr tablet TAKE 1 TABLET BY MOUTH ONCE DAILY. HOLD IF BLOOD PRESSURE NUMBER LESS THAN OR HEART RATE LESS THAN 60 BPM(PULSE) 90 tablet 0  ? nitroGLYCERIN (NITROSTAT) 0.4 MG SL tablet Place 1 tablet (0.4 mg total) under the tongue every 5 (five) minutes as needed for chest pain. If you require more than two tablets five minutes apart go to the nearest ER via EMS. 30 tablet 0  ? valsartan-hydrochlorothiazide (DIOVAN-HCT) 80-12.5 MG tablet Take 1 tablet by mouth daily.    ? ?No current facility-administered medications on file prior to visit.  ? ? ?ALLERGIES: ?No Known Allergies ? ?FAMILY HISTORY: ?Family History  ?Problem Relation Age of Onset  ? Diabetes Other   ? Hypertension Maternal Grandmother   ? ? ?  ?Objective:  ?Blood pressure 130/78, pulse (!) 55, height 5\' 7"  (1.702 m), weight 242 lb (109.8 kg), SpO2 95 %. ?General: No acute distress.  Patient appears well-groomed.   ?Head:  Normocephalic/atraumatic ?Eyes:  Fundi examined but not visualized ?Neck: supple, no paraspinal tenderness, full range of motion ?Heart:  Regular rate and rhythm ?Lungs:  Clear to auscultation bilaterally ?Back: No paraspinal tenderness ?Neurological Exam: alert and oriented to person, place, and time. Speech fluent and not dysarthric, language intact.  Right gaze preference.  Left lower facial weakness.  Otherwise, CN II-XII intact. Increased tone in proximal left upper extremity and proximal left lower extremity..  Left upper and lower  extremity hemiplegia except 4-/5 left hip flexion;  5/5 right upper and lower extremities.  Sensation to light touch reduced in left side.  Deep tendon reflexes 3+ left upper and lower extremities, 2+ right upper and lower extremities.  Finger to nose intact. Hemiplegic gait.   ? ? ? , DO ? ?CC: Shon Millet, MD ? ? ? ? ? ? ?

## 2021-12-24 ENCOUNTER — Other Ambulatory Visit: Payer: Self-pay

## 2021-12-24 ENCOUNTER — Encounter: Payer: Self-pay | Admitting: Neurology

## 2021-12-24 ENCOUNTER — Ambulatory Visit: Payer: Medicaid Other | Admitting: Neurology

## 2021-12-24 VITALS — BP 130/78 | HR 55 | Ht 67.0 in | Wt 242.0 lb

## 2021-12-24 DIAGNOSIS — E785 Hyperlipidemia, unspecified: Secondary | ICD-10-CM

## 2021-12-24 DIAGNOSIS — I10 Essential (primary) hypertension: Secondary | ICD-10-CM

## 2021-12-24 DIAGNOSIS — I69354 Hemiplegia and hemiparesis following cerebral infarction affecting left non-dominant side: Secondary | ICD-10-CM | POA: Diagnosis not present

## 2021-12-24 DIAGNOSIS — E089 Diabetes mellitus due to underlying condition without complications: Secondary | ICD-10-CM | POA: Diagnosis not present

## 2021-12-24 NOTE — Patient Instructions (Addendum)
Continue clopidogrel 75mg  daily, atorvastatin, blood pressure medications and diabetes medications ?Contact Dr. Vashti Hey office at [facility] [place].  They want you to follow up in about a year (April 2024) and have a repeat carotid artery ultrasound.   ?Follow up with me in one year ?

## 2021-12-28 ENCOUNTER — Encounter: Payer: Self-pay | Admitting: Cardiology

## 2022-01-29 ENCOUNTER — Ambulatory Visit (INDEPENDENT_AMBULATORY_CARE_PROVIDER_SITE_OTHER): Payer: Medicaid Other | Admitting: Podiatry

## 2022-01-29 ENCOUNTER — Encounter: Payer: Self-pay | Admitting: Podiatry

## 2022-01-29 DIAGNOSIS — B351 Tinea unguium: Secondary | ICD-10-CM | POA: Diagnosis not present

## 2022-01-29 DIAGNOSIS — E089 Diabetes mellitus due to underlying condition without complications: Secondary | ICD-10-CM

## 2022-01-29 DIAGNOSIS — M79674 Pain in right toe(s): Secondary | ICD-10-CM

## 2022-01-29 DIAGNOSIS — D689 Coagulation defect, unspecified: Secondary | ICD-10-CM

## 2022-01-29 DIAGNOSIS — M79675 Pain in left toe(s): Secondary | ICD-10-CM

## 2022-01-29 NOTE — Progress Notes (Signed)
This patient presents  to my office for at risk foot care.  This patient requires this care by a professional since this patient will be at risk due to having CVA, diabetes and coagulation defect.  Patient is taking plavix.  This patient is unable to cut nails herself since the patient cannot reach her nails.These nails are painful walking and wearing shoes.  She presents to the office with female caregiver.  This patient presents for at risk foot care today. ? ?General Appearance  Alert, conversant and in no acute stress. ? ?Vascular  Dorsalis pedis and posterior tibial  pulses are palpable  bilaterally.  Capillary return is within normal limits  bilaterally. Temperature is within normal limits  bilaterally. ? ?Neurologic  Senn-Weinstein monofilament wire test diminished  bilaterally. Muscle power within normal limits bilaterally. ? ?Nails Thick disfigured discolored nails with subungual debris  from hallux to fifth toes bilaterally. No evidence of bacterial infection or drainage bilaterally. ? ?Orthopedic  No limitations of motion  feet .  No crepitus or effusions noted.  No bony pathology or digital deformities noted. ? ?Skin  normotropic skin with no porokeratosis noted bilaterally.  No signs of infections or ulcers noted.    ? ?Onychomycosis  Pain in right toes  Pain in left toes ? ?Consent was obtained for treatment procedures.   Mechanical debridement of nails 1-5  bilaterally performed with a nail nipper.  Filed with dremel without incident.  ? ? ?Return office visit    3 months                    Told patient to return for periodic foot care and evaluation due to potential at risk complications. ? ? ?Helane Gunther DPM   ?

## 2022-02-11 ENCOUNTER — Ambulatory Visit: Payer: Medicaid Other | Admitting: Cardiology

## 2022-02-11 ENCOUNTER — Encounter: Payer: Self-pay | Admitting: Cardiology

## 2022-02-11 VITALS — BP 118/70 | HR 88 | Temp 98.4°F | Resp 16 | Ht 67.0 in | Wt 222.0 lb

## 2022-02-11 DIAGNOSIS — E1165 Type 2 diabetes mellitus with hyperglycemia: Secondary | ICD-10-CM

## 2022-02-11 DIAGNOSIS — I63231 Cerebral infarction due to unspecified occlusion or stenosis of right carotid arteries: Secondary | ICD-10-CM

## 2022-02-11 DIAGNOSIS — Z87891 Personal history of nicotine dependence: Secondary | ICD-10-CM

## 2022-02-11 DIAGNOSIS — E7800 Pure hypercholesterolemia, unspecified: Secondary | ICD-10-CM

## 2022-02-11 DIAGNOSIS — I10 Essential (primary) hypertension: Secondary | ICD-10-CM

## 2022-02-11 DIAGNOSIS — Z95818 Presence of other cardiac implants and grafts: Secondary | ICD-10-CM

## 2022-02-11 DIAGNOSIS — Z8673 Personal history of transient ischemic attack (TIA), and cerebral infarction without residual deficits: Secondary | ICD-10-CM

## 2022-02-11 DIAGNOSIS — R9439 Abnormal result of other cardiovascular function study: Secondary | ICD-10-CM

## 2022-02-11 NOTE — Progress Notes (Signed)
? ?ID:  Ebony Matthews, DOB 08-11-1981, MRN [id number] ? ?PCP:  Mirna Mires, MD  ?Cardiologist:  Tessa Lerner, DO, [facility] (established care 11/30/2020) ?Cardiology EP: Dr. Lewayne Bunting.  ? ?Date: 02/11/22 ?Last Office Visit: 08/14/2022 ? ?Chief Complaint  ?Patient presents with  ? Follow-up  ?  Review of loop recorder data, history of abnormal stress test, 18-month follow-up visit  ? ?HPI  ?Ebony Matthews is a 41 y.o. female who presents to the office with a chief complaint of " 21-month follow-up for reevaluation of chest pain and review loop recorder interrogation." Patient's past medical history and cardiovascular risk factors include: History of multiple strokes with residual deficits, right ICA occlusion, hypertension, diabetes, history of cocaine use, former smoker. ? ?Patient presented to the office after 2 strokes and residual deficits.  The index event per EMR was noted to be cryptogenic etiology and the second stroke in July 2021 was secondary to right ICA occlusion.  Patient was recommended to undergo loop recorder implantation during her hospitalization but chose to have it done as outpatient with our practice.  Loop recorder interrogation reviewed as part of today's office visit. ? ?In the past that she underwent an ischemic work-up given her strokes and other cardiovascular risk factors.  She was noted to have reversible perfusion defect in the LAD/LCx distribution.  Since she was asymptomatic she chose not to undergo additional testing such as coronary CTA or heart catheterization which is acceptable.  Since then her medications have been uptitrated and now presents for 86-month follow-up visit. ? ?Since last office visit patient has not had any anginal discomfort or heart failure symptoms.  No hospitalizations for cardiovascular symptoms or strokelike symptoms.  She continues to walk with a cane but no longer is wearing bilateral leg braces.  No use of sublingual nitroglycerin tablets since last office  visit. ? ?Patient has not had any labs since last office visit to the best of her knowledge.  She has an upcoming appointment with PCP and I have encouraged her to have her lipids as well as hemoglobin A1c checked. ? ?FUNCTIONAL STATUS: ?Limited as she ambulates with either a cane or walker after her strokes. ? ?ALLERGIES: ?No Known Allergies ? ?MEDICATION LIST PRIOR TO VISIT: ?Current Meds  ?Medication Sig  ? atorvastatin (LIPITOR) 40 MG tablet Take 40 mg by mouth at bedtime.  ? clopidogrel (PLAVIX) 75 MG tablet Take 1 tablet (75 mg total) by mouth daily.  ? Empagliflozin (JARDIANCE PO) Take 10 mg by mouth at bedtime.  ? metFORMIN (GLUCOPHAGE) 1000 MG tablet Take 1 tablet (1,000 mg total) by mouth 2 (two) times daily.  ? metoprolol succinate (TOPROL-XL) 25 MG 24 hr tablet TAKE 1 TABLET BY MOUTH ONCE DAILY. HOLD IF BLOOD PRESSURE NUMBER LESS THAN OR HEART RATE LESS THAN 60 BPM(PULSE)  ? nitroGLYCERIN (NITROSTAT) 0.4 MG SL tablet Place 1 tablet (0.4 mg total) under the tongue every 5 (five) minutes as needed for chest pain. If you require more than two tablets five minutes apart go to the nearest ER via EMS.  ? valsartan-hydrochlorothiazide (DIOVAN-HCT) 80-12.5 MG tablet Take 1 tablet by mouth daily.  ?  ? ?PAST MEDICAL HISTORY: ?Past Medical History:  ?Diagnosis Date  ? Diabetes mellitus without complication (HCC)   ? Hyperlipidemia   ? Hypertension   ? Loop recorder Biotronic 12/14/2020 12/14/2020  ? Scheduled Remote loop recorder check 12/14/2020: NSR. New implant.   ? Stroke [facility])   ? TIA (transient ischemic attack) 01/22/2019  ? ? ?  PAST SURGICAL HISTORY: ?Past Surgical History:  ?Procedure Laterality Date  ? NO PAST SURGERIES    ? ? ?FAMILY HISTORY: ?The patient family history includes Diabetes in an other family member; Hypertension in her maternal grandmother. ? ?SOCIAL HISTORY:  ?The patient  reports that she quit smoking about 2 years ago. Her smoking use included cigarettes. She has a 15.00 pack-year  smoking history. She has never used smokeless tobacco. She reports that she does not currently use drugs after having used the following drugs: Marijuana and Cocaine. She reports that she does not drink alcohol. ? ?REVIEW OF SYSTEMS: ?Review of Systems  ?Cardiovascular:  Negative for chest pain, claudication, dyspnea on exertion, leg swelling, near-syncope, orthopnea, palpitations, paroxysmal nocturnal dyspnea and syncope.  ?Respiratory:  Negative for cough, shortness of breath and wheezing.   ? ?PHYSICAL EXAM: ? ?  02/11/2022  ? 10:55 AM 12/24/2021  ? 10:46 AM 08/14/2021  ? 10:55 AM  ?Vitals with BMI  ?Height 5\' 7"  5\' 7"  5\' 7"   ?Weight 222 lbs 242 lbs 224 lbs 13 oz  ?BMI 34.76 37.89 35.2  ?Systolic 118 130 [id number]  ?Diastolic 70 78 86  ?Pulse 88 55 92  ? ? ?CONSTITUTIONAL: Appears older than stated age, hemodynamically stable, no acute distress.   ?SKIN: Skin is warm and dry. No rash noted. No cyanosis. No pallor. No jaundice ?HEAD: Normocephalic and atraumatic.  ?EYES: No scleral icterus ?MOUTH/THROAT: Moist oral membranes.  ?NECK: No JVD present. No thyromegaly noted. No carotid bruits  ?CHEST Normal respiratory effort. No intercostal retractions  ?LUNGS: Clear to auscultation bilaterally. No stridor. No wheezes. No rales.  ?CARDIOVASCULAR: Regular, tachycardic, positive S1-S2, no murmurs rubs or gallops appreciated. ?ABDOMINAL: Soft, nontender, nondistended, positive bowel sounds in all 4 quadrants, no apparent ascites.  ?EXTREMITIES: No peripheral edema, warm to touch. ?HEMATOLOGIC: No significant bruising ?NEUROLOGIC: Oriented to person, place, and time.  Range of motion preserved. ?PSYCHIATRIC: Normal mood and affect. Normal behavior. Cooperative ? ?Radiology/Studies:  ?  ?Ct Angio Head W Or Wo Contrast ?Result Date: 01/22/2019 ?CLINICAL DATA:  Right facial droop, slurred speech, right arm numbness. Diabetes and hypertension EXAM: CT ANGIOGRAPHY HEAD AND NECK TECHNIQUE: Multidetector CT imaging of the head and neck  was performed using the standard protocol during bolus administration of intravenous contrast. Multiplanar CT image reconstructions and MIPs were obtained to evaluate the vascular anatomy. Carotid stenosis measurements (when applicable) are obtained utilizing NASCET criteria, using the distal internal carotid diameter as the denominator. CONTRAST:  75mL OMNIPAQUE IOHEXOL 350 MG/ML SOLN COMPARISON:  None. FINDINGS: CT HEAD FINDINGS Brain: No evidence of acute infarction, hemorrhage, hydrocephalus, extra-axial collection or mass lesion/mass effect. Vascular: Negative for hyperdense vessel Skull: Negative Sinuses: Negative Orbits: Negative Review of the MIP images confirms the above findings CTA NECK FINDINGS Aortic arch: Standard branching. Imaged portion shows no evidence of aneurysm or dissection. No significant stenosis of the major arch vessel origins. Right carotid system: Right carotid bifurcation widely patent. Mild fusiform dilatation of the distal right internal carotid artery below the skull base with mild irregularity, question mild FMD. No significant stenosis Left carotid system: Similar findings to the right carotid with mild fusiform dilatation of the internal carotid artery below the skull base. Mild narrowing at the origin of the left internal carotid artery. No calcification or aneurysm. No significant stenosis. Vertebral arteries: Both vertebral arteries widely patent to the basilar without stenosis. Skeleton: No acute skeletal abnormality. Poor dentition with numerous caries. Other neck: Negative for soft tissue mass or  adenopathy. Upper chest: Negative Review of the MIP images confirms the above findings CTA HEAD FINDINGS Anterior circulation: Mild narrowing of the right cavernous carotid compared to the right internal carotid artery below the skull base. Right anterior and middle cerebral arteries widely patent Diffuse narrowing left cavernous carotid with mild-to-moderate focal stenosis proximal  cavernous carotid. Left anterior and middle cerebral arteries widely patent. Posterior circulation: Both vertebral arteries patent to the basilar. PICA patent bilaterally. Basilar widely patent. [place]

## 2022-05-02 ENCOUNTER — Encounter: Payer: Self-pay | Admitting: Podiatry

## 2022-05-02 ENCOUNTER — Ambulatory Visit (INDEPENDENT_AMBULATORY_CARE_PROVIDER_SITE_OTHER): Payer: Medicaid Other | Admitting: Podiatry

## 2022-05-02 DIAGNOSIS — M79674 Pain in right toe(s): Secondary | ICD-10-CM

## 2022-05-02 DIAGNOSIS — M79675 Pain in left toe(s): Secondary | ICD-10-CM

## 2022-05-02 DIAGNOSIS — B351 Tinea unguium: Secondary | ICD-10-CM

## 2022-05-02 DIAGNOSIS — D689 Coagulation defect, unspecified: Secondary | ICD-10-CM

## 2022-05-02 DIAGNOSIS — E089 Diabetes mellitus due to underlying condition without complications: Secondary | ICD-10-CM

## 2022-05-02 NOTE — Progress Notes (Signed)
This patient presents  to my office for at risk foot care.  This patient requires this care by a professional since this patient will be at risk due to having CVA, diabetes and coagulation defect.  Patient is taking plavix.  This patient is unable to cut nails herself since the patient cannot reach her nails.These nails are painful walking and wearing shoes.  She presents to the office with female caregiver.  This patient presents for at risk foot care today.  General Appearance  Alert, conversant and in no acute stress.  Vascular  Dorsalis pedis and posterior tibial  pulses are palpable  bilaterally.  Capillary return is within normal limits  bilaterally. Temperature is within normal limits  bilaterally.  Neurologic  Senn-Weinstein monofilament wire test diminished  bilaterally. Muscle power within normal limits bilaterally.  Nails Thick disfigured discolored nails with subungual debris  from hallux to fifth toes bilaterally. No evidence of bacterial infection or drainage bilaterally.  Orthopedic  No limitations of motion  feet .  No crepitus or effusions noted.  No bony pathology or digital deformities noted.  Skin  normotropic skin with no porokeratosis noted bilaterally.  No signs of infections or ulcers noted.     Onychomycosis  Pain in right toes  Pain in left toes  Consent was obtained for treatment procedures.   Mechanical debridement of nails 1-5  bilaterally performed with a nail nipper.  Filed with dremel without incident.    Return office visit    4  months                    Told patient to return for periodic foot care and evaluation due to potential at risk complications.   Jonh Mcqueary DPM   

## 2022-06-08 ENCOUNTER — Encounter: Payer: Self-pay | Admitting: Cardiology

## 2022-08-14 ENCOUNTER — Ambulatory Visit: Payer: Medicaid Other

## 2022-08-14 ENCOUNTER — Ambulatory Visit: Payer: Medicaid Other | Admitting: Cardiology

## 2022-08-14 VITALS — BP 109/68 | HR 100 | Resp 15 | Ht 67.0 in | Wt 232.0 lb

## 2022-08-14 DIAGNOSIS — R9439 Abnormal result of other cardiovascular function study: Secondary | ICD-10-CM

## 2022-08-14 DIAGNOSIS — Z95818 Presence of other cardiac implants and grafts: Secondary | ICD-10-CM

## 2022-08-14 DIAGNOSIS — I63231 Cerebral infarction due to unspecified occlusion or stenosis of right carotid arteries: Secondary | ICD-10-CM

## 2022-08-14 DIAGNOSIS — I10 Essential (primary) hypertension: Secondary | ICD-10-CM

## 2022-08-14 DIAGNOSIS — Z8673 Personal history of transient ischemic attack (TIA), and cerebral infarction without residual deficits: Secondary | ICD-10-CM

## 2022-08-14 NOTE — Progress Notes (Signed)
ID:  Ebony Matthews, DOB [birth year], MRN [id number]  PCP:  Iona Beard, MD  Cardiologist:  Rex Kras, DO, [facility] (established care 11/30/2020) Cardiology EP: Dr. Cristopher Peru.   Date: 08/14/22 Last Office Visit: 08/14/2022  Chief Complaint  Patient presents with   Follow-up    6 months   HPI  Ebony Matthews is a 41 y.o. female with past medical history and cardiovascular risk factors: History of multiple strokes with residual deficits, right ICA occlusion, hypertension, diabetes, history of cocaine use, former smoker.  Patient presented to the office after 2 strokes and residual deficits.  The index event per EMR was noted to be cryptogenic etiology and the second stroke in July 2021 was secondary to right ICA occlusion.  Patient was recommended to undergo loop recorder implantation during her hospitalization but chose to have it done as outpatient with our practice.  Loop recorder interrogation reviewed as part of today's office visit.  In the past that she underwent an ischemic work-up given her strokes and other cardiovascular risk factors.  She was noted to have reversible perfusion defect in the LAD/LCx distribution.  Since she was asymptomatic she chose not to undergo additional testing such as coronary CTA or heart catheterization which is acceptable.  Since then her medications have been uptitrated and now presents for 59-monthfollow-up visit.  She presents today for 3 month follow-up. Since last office visit patient has not had any anginal discomfort or heart failure symptoms.  No hospitalizations for cardiovascular symptoms or strokelike symptoms.  She continues to walk with a cane but no longer is wearing bilateral leg braces.  No use of sublingual nitroglycerin tablets since last office visit.  FUNCTIONAL STATUS: Limited as she ambulates with either a cane or walker after her strokes.  ALLERGIES: No Known Allergies  MEDICATION LIST PRIOR TO VISIT: Current Meds   Medication Sig   atorvastatin (LIPITOR) 40 MG tablet Take 40 mg by mouth at bedtime.   Empagliflozin (JARDIANCE PO) Take 10 mg by mouth at bedtime.   LANTUS SOLOSTAR 100 UNIT/ML Solostar Pen Inject into the skin.   metFORMIN (GLUCOPHAGE) 1000 MG tablet Take 1 tablet (1,000 mg total) by mouth 2 (two) times daily.   metoprolol succinate (TOPROL-XL) 25 MG 24 hr tablet TAKE 1 TABLET BY MOUTH ONCE DAILY. HOLD IF BLOOD PRESSURE NUMBER LESS THAN 100MMHG OR HEART RATE LESS THAN 60 BPM(PULSE)   nitroGLYCERIN (NITROSTAT) 0.4 MG SL tablet Place 1 tablet (0.4 mg total) under the tongue every 5 (five) minutes as needed for chest pain. If you require more than two tablets five minutes apart go to the nearest ER via EMS.   valsartan-hydrochlorothiazide (DIOVAN-HCT) 80-12.5 MG tablet Take 1 tablet by mouth daily.     PAST MEDICAL HISTORY: Past Medical History:  Diagnosis Date   Diabetes mellitus without complication ([place]    Hyperlipidemia    Hypertension    Loop recorder [employer] 12/14/2020 12/14/2020   Scheduled Remote loop recorder check 12/14/2020: NSR. New implant.    Stroke ([facility]    TIA (transient ischemic attack) 01/22/2019    PAST SURGICAL HISTORY: Past Surgical History:  Procedure Laterality Date   NO PAST SURGERIES      FAMILY HISTORY: The patient family history includes Diabetes in an other family member; Hypertension in her maternal grandmother.  SOCIAL HISTORY:  The patient  reports that she quit smoking about 2 years ago. Her smoking use included cigarettes. She has a 15.00 pack-year smoking history. She has never used smokeless  tobacco. She reports that she does not currently use drugs after having used the following drugs: Marijuana and Cocaine. She reports that she does not drink alcohol.  REVIEW OF SYSTEMS: Review of Systems  Cardiovascular:  Negative for chest pain, claudication, dyspnea on exertion, leg swelling, near-syncope, orthopnea, palpitations, paroxysmal nocturnal  dyspnea and syncope.  Respiratory:  Negative for shortness of breath.     PHYSICAL EXAM:    08/14/2022   10:11 AM 02/11/2022   10:55 AM 12/24/2021   10:46 AM  Vitals with BMI  Height _0  _1  _2   Weight 232 lbs 222 lbs 242 lbs  BMI 36.33 32.95 18.84  Systolic 166 063 016  Diastolic 68 70 78  Pulse 010 88 55   Physical Exam Cardiovascular:     Rate and Rhythm: Normal rate and regular rhythm.     Pulses:          Carotid pulses are 2+ on the right side and 2+ on the left side.      Radial pulses are 2+ on the right side and 2+ on the left side.       Dorsalis pedis pulses are 1+ on the right side and 1+ on the left side.     Heart sounds: Normal heart sounds. No murmur heard.    No gallop.  Pulmonary:     Effort: Pulmonary effort is normal.     Breath sounds: Normal breath sounds. No wheezing or rales.  Musculoskeletal:     Right lower leg: No edema.     Left lower leg: No edema.  Neurological:     Mental Status: She is alert.     Motor: Weakness present.     Gait: Gait abnormal.     Comments: S/P CVA with right sided residual     Radiology/Studies:    Ct Angio Head W Or Wo Contrast Result Date: 01/22/2019 CLINICAL DATA:  Right facial droop, slurred speech, right arm numbness. Diabetes and hypertension EXAM: CT ANGIOGRAPHY HEAD AND NECK TECHNIQUE: Multidetector CT imaging of the head and neck was performed using the standard protocol during bolus administration of intravenous contrast. Multiplanar CT image reconstructions and MIPs were obtained to evaluate the vascular anatomy. Carotid stenosis measurements (when applicable) are obtained utilizing NASCET criteria, using the distal internal carotid diameter as the denominator. CONTRAST:  38m OMNIPAQUE IOHEXOL 350 MG/ML SOLN COMPARISON:  None. FINDINGS: CT HEAD FINDINGS Brain: No evidence of acute infarction, hemorrhage, hydrocephalus, extra-axial collection or mass lesion/mass effect. Vascular: Negative for hyperdense  vessel Skull: Negative Sinuses: Negative Orbits: Negative Review of the MIP images confirms the above findings CTA NECK FINDINGS Aortic arch: Standard branching. Imaged portion shows no evidence of aneurysm or dissection. No significant stenosis of the major arch vessel origins. Right carotid system: Right carotid bifurcation widely patent. Mild fusiform dilatation of the distal right internal carotid artery below the skull base with mild irregularity, question mild FMD. No significant stenosis Left carotid system: Similar findings to the right carotid with mild fusiform dilatation of the internal carotid artery below the skull base. Mild narrowing at the origin of the left internal carotid artery. No calcification or aneurysm. No significant stenosis. Vertebral arteries: Both vertebral arteries widely patent to the basilar without stenosis. Skeleton: No acute skeletal abnormality. Poor dentition with numerous caries. Other neck: Negative for soft tissue mass or adenopathy. Upper chest: Negative Review of the MIP images confirms the above findings CTA HEAD FINDINGS Anterior circulation: Mild narrowing of the right  cavernous carotid compared to the right internal carotid artery below the skull base. Right anterior and middle cerebral arteries widely patent Diffuse narrowing left cavernous carotid with mild-to-moderate focal stenosis proximal cavernous carotid. Left anterior and middle cerebral arteries widely patent. Posterior circulation: Both vertebral arteries patent to the basilar. PICA patent bilaterally. Basilar widely patent. Superior cerebellar and posterior cerebral arteries widely patent. Venous sinuses: Patent Anatomic variants: None Delayed phase: Normal enhancement postcontrast administration. Review of the MIP images confirms the above findings IMPRESSION: 1. Negative CT head 2. Negative for emergent large vessel occlusion 3. Mild fusiform dilatation of the internal carotid artery below the skull base  with subsequent narrowing to the cavernous carotid bilaterally. Question FMD. No intracranial aneurysm. 4. These results were called by telephone at the time of interpretation on 01/22/2019 at 9:36 pm to Dr. Kerney Elbe , who verbally acknowledged these results. Electronically Signed   By: Franchot Gallo M.D.   On: 01/22/2019 21:36    Dg Chest 2 View Result Date: 01/23/2019 CLINICAL DATA:  Dizziness EXAM: CHEST - 2 VIEW COMPARISON:  07/29/2016 FINDINGS: The heart size and mediastinal contours are within normal limits. Both lungs are clear. The visualized skeletal structures are unremarkable. IMPRESSION: No active cardiopulmonary disease. Electronically Signed   By: Ulyses Jarred M.D.   On: 01/23/2019 02:35     Mr Brain Wo Contrast Result Date: 01/23/2019 CLINICAL DATA:  Near syncopal event with slurred speech and right upper extremity numbness. Right facial droop. Symptoms resolved after 5 minutes. EXAM: MRI HEAD WITHOUT CONTRAST TECHNIQUE: Multiplanar, multiecho pulse sequences of the brain and surrounding structures were obtained without intravenous contrast. COMPARISON:  CTA head neck 01/22/2019 FINDINGS: BRAIN: There is multifocal acute ischemia, mostly within the left hemisphere, with punctate foci along the left precentral gyrus and within the posterior left parietal lobe. There is a single focus in the right hemispheric white matter and a small linear focus in the left occipital lobe. The midline structures are normal. No midline shift or other mass effect. Multifocal white matter hyperintensity, most commonly due to chronic ischemic microangiopathy. The cerebral and cerebellar volume are age-appropriate. No hydrocephalus. Susceptibility-sensitive sequences show no chronic microhemorrhage or superficial siderosis. No mass lesion. VASCULAR: The major intracranial arterial and venous sinus flow voids are normal. SKULL AND UPPER CERVICAL SPINE: Calvarial bone marrow signal is normal. There is no skull base  mass. Visualized upper cervical spine and soft tissues are normal. SINUSES/ORBITS: No fluid levels or advanced mucosal thickening. No mastoid or middle ear effusion. The orbits are normal. IMPRESSION: 1. Multifocal acute ischemia, predominantly in the left hemisphere, although there is a single focus within the right frontal white matter. Punctate ischemic foci along the left motor strip are compatible with reported right upper extremity symptoms. 2. No hemorrhage or mass effect. 3. Mild findings of chronic ischemic microangiopathy. Electronically Signed   By: Ulyses Jarred M.D.   On: 01/23/2019 02:35   MRI brain without contrast 03/03/2020: 1. Scattered multifocal subcentimeter acute ischemic infarcts involving the right cerebral hemisphere as above. Few additional subcentimeter infarcts involve the left occipital lobe and inferior right cerebellum. No associated hemorrhage or mass effect. 2. Small chronic infarct involving the right middle cerebellar peduncle, chronic in appearance, but new as compared to previous. 3. Underlying mild to moderate chronic microvascular ischemic disease, mildly progressed from previous.   CTA neck with and without contrast: 04/15/2020. 1. Acute proximal right ICA occlusion, new from previous. Distal reconstitution at the supraclinoid segment via collateral flow  across the circle-of-Willis. Right MCA is perfused although attenuated as compared to the contralateral left. 2. Otherwise stable CTA of the head and neck. No other large vessel occlusion, hemodynamically significant stenosis, or other acute vascular abnormality. 3. 4 mm left upper lobe nodule, indeterminate. No follow-up needed if patient is low-risk. Non-contrast chest CT can be considered in 12 months if patient is high-risk. This recommendation follows the consensus statement: Guidelines for Management of Incidental Pulmonary Nodules Detected on CT Images: From the Fleischner Society 2017; Radiology 2017;  284:228-243.  MRI brain with and without contrast 04/15/2020: Multiple new acute infarcts in the right cerebral hemisphere. Small acute infarct of the right pons.  CARDIAC DATABASE: EKG 08/14/2022: Normal sinus rhythm at rate of 100 bpm.  Normal axis.  Poor R wave progression, possible pulmonary disease.  No evidence of ischemia or underlying injury pattern.  Compared to previous EKG on 02/11/2022, no significant change.  Echocardiogram: 03/04/2020:  1. Left ventricular ejection fraction, by estimation, is 60 to 65%. The  left ventricle has normal function. The left ventricle has no regional  wall motion abnormalities. There is mild left ventricular hypertrophy.  Left ventricular diastolic parameters  are consistent with Grade I diastolic dysfunction (impaired relaxation).   2. Right ventricular systolic function is normal. The right ventricular  size is normal.   3. The mitral valve is normal in structure. No evidence of mitral valve  regurgitation. No evidence of mitral stenosis.   4. The aortic valve is normal in structure. Aortic valve regurgitation is  not visualized. No aortic stenosis is present.   5. The inferior vena cava is normal in size with greater than 50%  respiratory variability, suggesting right atrial pressure of 3 mmHg.   Conclusion(s)/Recommendation(s): No intracardiac source of embolism  detected on this transthoracic study. A transesophageal echocardiogram is  recommended to exclude cardiac source of embolism if clinically indicated.   Stress Testing: Lexiscan Sestamibi stress test 12/10/2020: Abnormal nuclear stress test: 1 Day Rest/Stress Protocol. Stress EKG is non-diagnostic, as this is pharmacological stress test using Lexiscan. Medium size, moderate intensity, reversible perfusion defect suggestive of ischemia in the LCx distribution. Small size, moderate intensity, reversible perfusion defect suggestive of ischemia in the LAD distribution. Small size,  fixed perfusion defect involving the basal to mid inferior segments most likely secondary to soft tissue attenuation artifact with possible small degree of reversible ischemia involving the basal to mid inferoseptal segments cannot be ruled out. Calculated LVEF 64% without regional wall motion abnormalities. Clinical correlation required.  Heart Catheterization: None   Zio Patch Extended out patient EKG monitoring 14 days starting 10/10/2020: Predominant rhythm is normal sinus rhythm.  Minimum heart rate 67 and maximum heart rate 160 bpm with an average heart rate of 98 bpm. There were occasional PACs and atrial couplets.  There were rare PVCs.  There was no atrial fibrillation. There was no heart block, no symptomatic events recorded  Remote loop recorder transmission 01/29/2022: Predominant rhythm is normal sinus rhythm.  No significant arrhythmias, no symptoms reported.   LABORATORY DATA:    Latest Ref Rng & Units 04/23/2020   11:25 AM 04/15/2020    4:52 PM 03/03/2020   12:38 PM  CBC  WBC 4.0 - 10.5 K/uL 7.5  8.6    Hemoglobin 12.0 - 15.0 g/dL 13.9  14.1  15.0   Hematocrit 36.0 - 46.0 % 43.7  43.2  44.0   Platelets 150 - 400 K/uL 199  214  Latest Ref Rng & Units 04/23/2020   11:25 AM 04/15/2020    4:52 PM 03/03/2020   12:38 PM  CMP  Glucose 70 - 99 mg/dL 200  378  319   BUN 6 - 20 mg/dL _0 Creatinine 0.44 - 1.00 mg/dL 0.81  0.89  0.70   Sodium 135 - 145 mmol/L 133  135  135   Potassium 3.5 - 5.1 mmol/L 3.9  3.7  4.3   Chloride 98 - 111 mmol/L 101  100  100   CO2 22 - 32 mmol/L 21  22    Calcium 8.9 - 10.3 mg/dL 9.5  9.7    Total Protein 6.5 - 8.1 g/dL 7.1  8.2    Total Bilirubin 0.3 - 1.2 mg/dL 0.7  0.8    Alkaline Phos 38 - 126 U/L 74  117    AST 15 - 41 U/L 23  24    ALT 0 - 44 U/L 21  20      Lipid Panel  Lab Results  Component Value Date   CHOL 258 (H) 03/04/2020   HDL 29 (L) 03/04/2020   LDLCALC 202 (H) 03/04/2020   TRIG 133 03/04/2020   CHOLHDL  8.9 03/04/2020   BMP No results for input(s): "NA", "K", "CL", "CO2", "GLUCOSE", "BUN", "CREATININE", "CALCIUM", "GFRNONAA", "GFRAA" in the last 8760 hours.   HEMOGLOBIN A1C Lab Results  Component Value Date   HGBA1C 10.7 (H) 03/04/2020   MPG 260.39 03/04/2020    External Labs:  Collected: 11/28/2020 provided by PCP during the office visit Creatinine 0.81 mg/dL. eGFR: 105 mL/min per 1.73 m Lipid profile: Total cholesterol 122, triglycerides 91, HDL 34, LDL 71, non-HDL 88 Sodium 137, potassium 4.6, chloride 103, bicarbonate 24, AST 18, ALT 35, alkaline phosphatase 76 Hemoglobin 12.3 g/dL, hematocrit 38.9% Hemoglobin A1c: 8  IMPRESSION:    ICD-10-CM   1. Abnormal stress test  R94.39     2. Essential hypertension, benign  I10 EKG 12-Lead    3. Status post placement of implantable loop recorder  Z95.818     4. Hx of  stroke  Z86.73     5. Hx of right proximal ICA occlusion  I63.231        RECOMMENDATIONS: Briggitte Boline is a 41 y.o. female whose past medical history and cardiac risk factors include: Cryptogenic stroke w/ left-sided deficits, diabetes mellitus, hypertension, back pain, smoker, marijuana and cocaine use.  Abnormal stress test Had a stress test back in March 2022 which rules concerns for possible reversible ischemia in the LCx/LAD distribution. Since she was asymptomatic patient chose to not undergo any additional testing despite discussing coronary CTA versus heart catheterization. Her antihypertensive and antianginal therapy were uptitrated. No use of sublingual nitroglycerin tablets since last office visit. She remains asymptomatic. Encouraged the importance of improving her modifiable cardiovascular risk factors. Educated on seeking medical attention sooner by going to the closest ER via EMS if the symptoms increase in intensity, frequency, duration, or has typical chest pain as discussed in the office.  Patient verbalized understanding.  Essential  hypertension, benign Office blood pressures are well controlled. Continue current medications without changes. Discussed the importance of low-sodium diet, exercise, and weight loss.  Status post placement of implantable loop recorder Loop recorder transmission 04/01/2022: Normal sinus rhythm.  No symptoms reported. No evidence of atrial fibrillation.  Hx of  stroke / Hx of right proximal ICA occlusion History of both cryptogenic stroke and CVA due to  ICA occlusion. Currently on antiplatelet and statin therapy. No recent lipid profile available for review. She recently had labs checked at PCP, will request these records.  FINAL MEDICATION LIST END OF ENCOUNTER: No orders of the defined types were placed in this encounter.    Current Outpatient Medications:    atorvastatin (LIPITOR) 40 MG tablet, Take 40 mg by mouth at bedtime., Disp: , Rfl:    Empagliflozin (JARDIANCE PO), Take 10 mg by mouth at bedtime., Disp: , Rfl:    LANTUS SOLOSTAR 100 UNIT/ML Solostar Pen, Inject into the skin., Disp: , Rfl:    metFORMIN (GLUCOPHAGE) 1000 MG tablet, Take 1 tablet (1,000 mg total) by mouth 2 (two) times daily., Disp: 60 tablet, Rfl: 0   metoprolol succinate (TOPROL-XL) 25 MG 24 hr tablet, TAKE 1 TABLET BY MOUTH ONCE DAILY. HOLD IF BLOOD PRESSURE NUMBER LESS THAN 100MMHG OR HEART RATE LESS THAN 60 BPM(PULSE), Disp: 90 tablet, Rfl: 0   nitroGLYCERIN (NITROSTAT) 0.4 MG SL tablet, Place 1 tablet (0.4 mg total) under the tongue every 5 (five) minutes as needed for chest pain. If you require more than two tablets five minutes apart go to the nearest ER via EMS., Disp: 30 tablet, Rfl: 0   valsartan-hydrochlorothiazide (DIOVAN-HCT) 80-12.5 MG tablet, Take 1 tablet by mouth daily., Disp: , Rfl:    clopidogrel (PLAVIX) 75 MG tablet, Take 1 tablet (75 mg total) by mouth daily., Disp: 30 tablet, Rfl: 0  Orders Placed This Encounter  Procedures   EKG 12-Lead    There are no Patient Instructions on file for  this visit.   --Continue cardiac medications as reconciled in final medication list. --Return in about 3 months (around 11/14/2022) for HTN, CVA. Or sooner if needed. --Continue follow-up with your primary care physician regarding the management of your other chronic comorbid conditions.  Patient's questions and concerns were addressed to her satisfaction. She voices understanding of the instructions provided during this encounter.   This note was created using a voice recognition software as a result there may be grammatical errors inadvertently enclosed that do not reflect the nature of this encounter. Every attempt is made to correct such errors.    Ernst Spell, [place] Pager: [phone] Office: [phone]

## 2022-08-28 ENCOUNTER — Telehealth: Payer: Self-pay

## 2022-08-28 NOTE — Telephone Encounter (Signed)
Overdue Remote Transmissions: 145 days [employer]  Called patient regarding her overdue remote transmissions. She said that she will reconnect transmitter right now.

## 2022-09-05 ENCOUNTER — Encounter: Payer: Self-pay | Admitting: Podiatry

## 2022-09-05 ENCOUNTER — Ambulatory Visit (INDEPENDENT_AMBULATORY_CARE_PROVIDER_SITE_OTHER): Payer: Medicaid Other | Admitting: Podiatry

## 2022-09-05 VITALS — BP 117/60

## 2022-09-05 DIAGNOSIS — B351 Tinea unguium: Secondary | ICD-10-CM

## 2022-09-05 DIAGNOSIS — D689 Coagulation defect, unspecified: Secondary | ICD-10-CM

## 2022-09-05 DIAGNOSIS — M79674 Pain in right toe(s): Secondary | ICD-10-CM | POA: Diagnosis not present

## 2022-09-05 DIAGNOSIS — E089 Diabetes mellitus due to underlying condition without complications: Secondary | ICD-10-CM

## 2022-09-05 DIAGNOSIS — M79675 Pain in left toe(s): Secondary | ICD-10-CM

## 2022-09-05 NOTE — Progress Notes (Signed)
This patient presents  to my office for at risk foot care.  This patient requires this care by a professional since this patient will be at risk due to having CVA, diabetes and coagulation defect.  Patient is taking plavix.  This patient is unable to cut nails herself since the patient cannot reach her nails.These nails are painful walking and wearing shoes.  She presents to the office with female caregiver.  This patient presents for at risk foot care today.  General Appearance  Alert, conversant and in no acute stress.  Vascular  Dorsalis pedis and posterior tibial  pulses are palpable  bilaterally.  Capillary return is within normal limits  bilaterally. Temperature is within normal limits  bilaterally.  Neurologic  Senn-Weinstein monofilament wire test diminished  bilaterally. Muscle power within normal limits bilaterally.  Nails Thick disfigured discolored nails with subungual debris  from hallux to fifth toes bilaterally. No evidence of bacterial infection or drainage bilaterally.  Orthopedic  No limitations of motion  feet .  No crepitus or effusions noted.  No bony pathology or digital deformities noted.  Skin  normotropic skin with no porokeratosis noted bilaterally.  No signs of infections or ulcers noted.     Onychomycosis  Pain in right toes  Pain in left toes  Consent was obtained for treatment procedures.   Mechanical debridement of nails 1-5  bilaterally performed with a nail nipper.  Filed with dremel without incident.    Return office visit    4  months                    Told patient to return for periodic foot care and evaluation due to potential at risk complications.   Helane Gunther DPM

## 2022-09-11 ENCOUNTER — Encounter: Payer: Self-pay | Admitting: Cardiology

## 2022-10-02 ENCOUNTER — Other Ambulatory Visit: Payer: Self-pay

## 2022-10-02 DIAGNOSIS — R072 Precordial pain: Secondary | ICD-10-CM

## 2022-10-02 DIAGNOSIS — R9439 Abnormal result of other cardiovascular function study: Secondary | ICD-10-CM

## 2022-10-02 MED ORDER — METOPROLOL SUCCINATE ER 25 MG PO TB24: ORAL_TABLET | ORAL | 3 refills | Status: DC

## 2022-10-02 NOTE — Telephone Encounter (Signed)
Metoprolol request.

## 2022-10-11 ENCOUNTER — Encounter: Payer: Self-pay | Admitting: Cardiology

## 2022-10-30 ENCOUNTER — Telehealth: Payer: Self-pay

## 2022-10-30 NOTE — Telephone Encounter (Signed)
I have made several attempts to contact patient regarding new home transmitter mailed to her. I have also sent a letter.

## 2022-11-14 ENCOUNTER — Ambulatory Visit: Payer: Medicare Other

## 2022-11-14 ENCOUNTER — Ambulatory Visit: Payer: Medicare Other | Admitting: Cardiology

## 2022-11-14 ENCOUNTER — Encounter: Payer: Self-pay | Admitting: Cardiology

## 2022-11-14 VITALS — BP 142/82 | HR 77 | Resp 14 | Ht 67.0 in | Wt 225.0 lb

## 2022-11-14 DIAGNOSIS — Z95818 Presence of other cardiac implants and grafts: Secondary | ICD-10-CM

## 2022-11-14 DIAGNOSIS — Z4509 Encounter for adjustment and management of other cardiac device: Secondary | ICD-10-CM

## 2022-11-14 DIAGNOSIS — E1165 Type 2 diabetes mellitus with hyperglycemia: Secondary | ICD-10-CM

## 2022-11-14 DIAGNOSIS — I10 Essential (primary) hypertension: Secondary | ICD-10-CM

## 2022-11-14 DIAGNOSIS — Z8673 Personal history of transient ischemic attack (TIA), and cerebral infarction without residual deficits: Secondary | ICD-10-CM

## 2022-11-14 DIAGNOSIS — G459 Transient cerebral ischemic attack, unspecified: Secondary | ICD-10-CM

## 2022-11-14 DIAGNOSIS — Z6835 Body mass index (BMI) 35.0-35.9, adult: Secondary | ICD-10-CM

## 2022-11-14 DIAGNOSIS — E7800 Pure hypercholesterolemia, unspecified: Secondary | ICD-10-CM

## 2022-11-14 DIAGNOSIS — Z87891 Personal history of nicotine dependence: Secondary | ICD-10-CM

## 2022-11-14 DIAGNOSIS — I63231 Cerebral infarction due to unspecified occlusion or stenosis of right carotid arteries: Secondary | ICD-10-CM

## 2022-11-14 NOTE — Progress Notes (Signed)
ID:  Ebony Matthews, DOB 1981/08/31, MRN [id number]  PCP:  Iona Beard, MD  Cardiologist:  Rex Kras, DO, [facility] (established care 11/30/2020) Cardiology EP: Dr. Cristopher Peru.   Date: 11/14/22 Last Office Visit: 08/14/2022  Chief Complaint  Patient presents with   Follow-up    History of stroke.  Reactivation of loop Recorder.   HPI  Ebony Matthews is a 42 y.o. female with past medical history and cardiovascular risk factors: History of multiple strokes with residual deficits, right ICA occlusion, hypertension, diabetes, history of cocaine use, former smoker.  Patient establish care back in March 2022 after experiencing 2 strokes.  Her initial stroke was likely secondary to cryptogenic etiology and second stroke was in July 2021 likely secondary to right ICA occlusion.  After experiencing 2 CVAs shared decision was to proceed with loop implant to monitor for atrial fibrillation.  She presents today for follow-up.  Denies anginal discomfort or heart failure symptoms.  She had her loop recorder reactivated and a new transmitter provided during today's visit.  Her office blood pressures are now well-controlled and she does not check them at home either.  She has not had any recent labs but has a follow-up appointment with PCP on December 09, 2022.  FUNCTIONAL STATUS: Limited as she ambulates with either a cane or walker after her strokes.  ALLERGIES: No Known Allergies  MEDICATION LIST PRIOR TO VISIT: Current Meds  Medication Sig   atorvastatin (LIPITOR) 40 MG tablet Take 40 mg by mouth at bedtime.   Empagliflozin (JARDIANCE PO) Take 10 mg by mouth at bedtime.   metFORMIN (GLUCOPHAGE) 1000 MG tablet Take 1 tablet (1,000 mg total) by mouth 2 (two) times daily.   metoprolol succinate (TOPROL-XL) 25 MG 24 hr tablet TAKE 1 TABLET BY MOUTH ONCE DAILY. HOLD IF BLOOD PRESSURE NUMBER LESS THAN 100MMHG OR HEART RATE LESS THAN 60 BPM(PULSE)   nitroGLYCERIN (NITROSTAT) 0.4 MG SL tablet  Place 1 tablet (0.4 mg total) under the tongue every 5 (five) minutes as needed for chest pain. If you require more than two tablets five minutes apart go to the nearest ER via EMS.   valsartan-hydrochlorothiazide (DIOVAN-HCT) 80-12.5 MG tablet Take 1 tablet by mouth daily.     PAST MEDICAL HISTORY: Past Medical History:  Diagnosis Date   Diabetes mellitus without complication ([place])    Hyperlipidemia    Hypertension    Loop recorder [employer] 12/14/2020 12/14/2020   Scheduled Remote loop recorder check 12/14/2020: NSR. New implant.    Stroke [facility])    TIA (transient ischemic attack) 01/22/2019    PAST SURGICAL HISTORY: Past Surgical History:  Procedure Laterality Date   NO PAST SURGERIES      FAMILY HISTORY: The patient family history includes Diabetes in an other family member; Hypertension in her maternal grandmother.  SOCIAL HISTORY:  The patient  reports that she quit smoking about 3 years ago. Her smoking use included cigarettes. She has a 15.00 pack-year smoking history. She has never used smokeless tobacco. She reports that she does not currently use drugs after having used the following drugs: Marijuana and Cocaine. She reports that she does not drink alcohol.  REVIEW OF SYSTEMS: Review of Systems  Cardiovascular:  Negative for chest pain, claudication, dyspnea on exertion, leg swelling, near-syncope, orthopnea, palpitations, paroxysmal nocturnal dyspnea and syncope.  Respiratory:  Negative for shortness of breath.     PHYSICAL EXAM:    11/14/2022    9:44 AM 09/05/2022    9:20 AM 08/14/2022  10:11 AM  Vitals with BMI  Height 5' 7"$   5' 7"$   Weight 225 lbs  232 lbs  BMI [id number]  [id number]  Systolic [id number]  Diastolic 82 60 68  Pulse 77  100   Physical Exam Constitutional:      Appearance: Normal appearance. She is obese. She is not ill-appearing.     Comments: Walks w/ cane  Cardiovascular:     Rate and Rhythm: Normal rate and regular rhythm.     Pulses:           Carotid pulses are 2+ on the right side and 2+ on the left side.      Radial pulses are 2+ on the right side and 2+ on the left side.       Dorsalis pedis pulses are 1+ on the right side and 1+ on the left side.     Heart sounds: Normal heart sounds. No murmur heard.    No gallop.  Pulmonary:     Effort: Pulmonary effort is normal.     Breath sounds: Normal breath sounds. No wheezing or rales.  Musculoskeletal:     Right lower leg: No edema.     Left lower leg: No edema.  Neurological:     Mental Status: She is alert.     Motor: Weakness present.     Gait: Gait abnormal.     Comments: S/P CVA with right sided residual     Radiology/Studies:    Ct Angio Head W Or Wo Contrast Result Date: 01/22/2019 CLINICAL DATA:  Right facial droop, slurred speech, right arm numbness. Diabetes and hypertension EXAM: CT ANGIOGRAPHY HEAD AND NECK TECHNIQUE: Multidetector CT imaging of the head and neck was performed using the standard protocol during bolus administration of intravenous contrast. Multiplanar CT image reconstructions and MIPs were obtained to evaluate the vascular anatomy. Carotid stenosis measurements (when applicable) are obtained utilizing NASCET criteria, using the distal internal carotid diameter as the denominator. CONTRAST:  57m OMNIPAQUE IOHEXOL 350 MG/ML SOLN COMPARISON:  None. FINDINGS: CT HEAD FINDINGS Brain: No evidence of acute infarction, hemorrhage, hydrocephalus, extra-axial collection or mass lesion/mass effect. Vascular: Negative for hyperdense vessel Skull: Negative Sinuses: Negative Orbits: Negative Review of the MIP images confirms the above findings CTA NECK FINDINGS Aortic arch: Standard branching. Imaged portion shows no evidence of aneurysm or dissection. No significant stenosis of the major arch vessel origins. Right carotid system: Right carotid bifurcation widely patent. Mild fusiform dilatation of the distal right internal carotid artery below the skull base with mild  irregularity, question mild FMD. No significant stenosis Left carotid system: Similar findings to the right carotid with mild fusiform dilatation of the internal carotid artery below the skull base. Mild narrowing at the origin of the left internal carotid artery. No calcification or aneurysm. No significant stenosis. Vertebral arteries: Both vertebral arteries widely patent to the basilar without stenosis. Skeleton: No acute skeletal abnormality. Poor dentition with numerous caries. Other neck: Negative for soft tissue mass or adenopathy. Upper chest: Negative Review of the MIP images confirms the above findings CTA HEAD FINDINGS Anterior circulation: Mild narrowing of the right cavernous carotid compared to the right internal carotid artery below the skull base. Right anterior and middle cerebral arteries widely patent Diffuse narrowing left cavernous carotid with mild-to-moderate focal stenosis proximal cavernous carotid. Left anterior and middle cerebral arteries widely patent. Posterior circulation: Both vertebral arteries patent to the basilar. PICA patent bilaterally. Basilar widely patent. Superior cerebellar and posterior cerebral arteries  widely patent. Venous sinuses: Patent Anatomic variants: None Delayed phase: Normal enhancement postcontrast administration. Review of the MIP images confirms the above findings IMPRESSION: 1. Negative CT head 2. Negative for emergent large vessel occlusion 3. Mild fusiform dilatation of the internal carotid artery below the skull base with subsequent narrowing to the cavernous carotid bilaterally. Question FMD. No intracranial aneurysm. 4. These results were called by telephone at the time of interpretation on 01/22/2019 at 9:36 pm to Dr. Kerney Elbe , who verbally acknowledged these results. Electronically Signed   By: Franchot Gallo M.D.   On: 01/22/2019 21:36    Dg Chest 2 View Result Date: 01/23/2019 CLINICAL DATA:  Dizziness EXAM: CHEST - 2 VIEW COMPARISON:   07/29/2016 FINDINGS: The heart size and mediastinal contours are within normal limits. Both lungs are clear. The visualized skeletal structures are unremarkable. IMPRESSION: No active cardiopulmonary disease. Electronically Signed   By: Ulyses Jarred M.D.   On: 01/23/2019 02:35     Mr Brain Wo Contrast Result Date: 01/23/2019 CLINICAL DATA:  Near syncopal event with slurred speech and right upper extremity numbness. Right facial droop. Symptoms resolved after 5 minutes. EXAM: MRI HEAD WITHOUT CONTRAST TECHNIQUE: Multiplanar, multiecho pulse sequences of the brain and surrounding structures were obtained without intravenous contrast. COMPARISON:  CTA head neck 01/22/2019 FINDINGS: BRAIN: There is multifocal acute ischemia, mostly within the left hemisphere, with punctate foci along the left precentral gyrus and within the posterior left parietal lobe. There is a single focus in the right hemispheric white matter and a small linear focus in the left occipital lobe. The midline structures are normal. No midline shift or other mass effect. Multifocal white matter hyperintensity, most commonly due to chronic ischemic microangiopathy. The cerebral and cerebellar volume are age-appropriate. No hydrocephalus. Susceptibility-sensitive sequences show no chronic microhemorrhage or superficial siderosis. No mass lesion. VASCULAR: The major intracranial arterial and venous sinus flow voids are normal. SKULL AND UPPER CERVICAL SPINE: Calvarial bone marrow signal is normal. There is no skull base mass. Visualized upper cervical spine and soft tissues are normal. SINUSES/ORBITS: No fluid levels or advanced mucosal thickening. No mastoid or middle ear effusion. The orbits are normal. IMPRESSION: 1. Multifocal acute ischemia, predominantly in the left hemisphere, although there is a single focus within the right frontal white matter. Punctate ischemic foci along the left motor strip are compatible with reported right upper  extremity symptoms. 2. No hemorrhage or mass effect. 3. Mild findings of chronic ischemic microangiopathy. Electronically Signed   By: Ulyses Jarred M.D.   On: 01/23/2019 02:35   MRI brain without contrast 03/03/2020: 1. Scattered multifocal subcentimeter acute ischemic infarcts involving the right cerebral hemisphere as above. Few additional subcentimeter infarcts involve the left occipital lobe and inferior right cerebellum. No associated hemorrhage or mass effect. 2. Small chronic infarct involving the right middle cerebellar peduncle, chronic in appearance, but new as compared to previous. 3. Underlying mild to moderate chronic microvascular ischemic disease, mildly progressed from previous.   CTA neck with and without contrast: 04/15/2020. 1. Acute proximal right ICA occlusion, new from previous. Distal reconstitution at the supraclinoid segment via collateral flow across the circle-of-Willis. Right MCA is perfused although attenuated as compared to the contralateral left. 2. Otherwise stable CTA of the head and neck. No other large vessel occlusion, hemodynamically significant stenosis, or other acute vascular abnormality. 3. 4 mm left upper lobe nodule, indeterminate. No follow-up needed if patient is low-risk. Non-contrast chest CT can be considered in 12 months if  patient is high-risk. This recommendation follows the consensus statement: Guidelines for Management of Incidental Pulmonary Nodules Detected on CT Images: From the Fleischner Society 2017; Radiology 2017; 284:228-243.  MRI brain with and without contrast 04/15/2020: Multiple new acute infarcts in the right cerebral hemisphere. Small acute infarct of the right pons.  CARDIAC DATABASE: EKG 11/14/2022: Sinus rhythm, 86 bpm,LAE, without  injury pattern  Echocardiogram: 03/04/2020:  1. Left ventricular ejection fraction, by estimation, is 60 to 65%. The  left ventricle has normal function. The left ventricle has no regional  wall  motion abnormalities. There is mild left ventricular hypertrophy.  Left ventricular diastolic parameters  are consistent with Grade I diastolic dysfunction (impaired relaxation).   2. Right ventricular systolic function is normal. The right ventricular  size is normal.   3. The mitral valve is normal in structure. No evidence of mitral valve  regurgitation. No evidence of mitral stenosis.   4. The aortic valve is normal in structure. Aortic valve regurgitation is  not visualized. No aortic stenosis is present.   5. The inferior vena cava is normal in size with greater than 50%  respiratory variability, suggesting right atrial pressure of 3 mmHg.   Conclusion(s)/Recommendation(s): No intracardiac source of embolism  detected on this transthoracic study. A transesophageal echocardiogram is  recommended to exclude cardiac source of embolism if clinically indicated.   Stress Testing: Lexiscan Sestamibi stress test 12/10/2020: Abnormal nuclear stress test: 1 Day Rest/Stress Protocol. Stress EKG is non-diagnostic, as this is pharmacological stress test using Lexiscan. Medium size, moderate intensity, reversible perfusion defect suggestive of ischemia in the LCx distribution. Small size, moderate intensity, reversible perfusion defect suggestive of ischemia in the LAD distribution. Small size, fixed perfusion defect involving the basal to mid inferior segments most likely secondary to soft tissue attenuation artifact with possible small degree of reversible ischemia involving the basal to mid inferoseptal segments cannot be ruled out. Calculated LVEF 64% without regional wall motion abnormalities. Clinical correlation required.  Heart Catheterization: None   Zio Patch Extended out patient EKG monitoring 14 days starting 10/10/2020: Predominant rhythm is normal sinus rhythm.  Minimum heart rate 67 and maximum heart rate 160 bpm with an average heart rate of 98 bpm. There were occasional PACs  and atrial couplets.  There were rare PVCs.  There was no atrial fibrillation. There was no heart block, no symptomatic events recorded  Remote loop recorder transmission 01/29/2022: Predominant rhythm is normal sinus rhythm.  No significant arrhythmias, no symptoms reported.   LABORATORY DATA:    Latest Ref Rng & Units 04/23/2020   11:25 AM 04/15/2020    4:52 PM 03/03/2020   12:38 PM  CBC  WBC 4.0 - 10.5 K/uL 7.5  8.6    Hemoglobin 12.0 - 15.0 g/dL 13.9  14.1  15.0   Hematocrit 36.0 - 46.0 % 43.7  43.2  44.0   Platelets 150 - 400 K/uL 199  214         Latest Ref Rng & Units 04/23/2020   11:25 AM 04/15/2020    4:52 PM 03/03/2020   12:38 PM  CMP  Glucose 70 - 99 mg/dL 200  378  319   BUN 6 - 20 mg/dL 7  7  11   $ Creatinine 0.44 - 1.00 mg/dL 0.81  0.89  0.70   Sodium 135 - 145 mmol/L 133  135  135   Potassium 3.5 - 5.1 mmol/L 3.9  3.7  4.3   Chloride 98 - 111 mmol/L 101  100  100   CO2 22 - 32 mmol/L 21  22    Calcium 8.9 - 10.3 mg/dL 9.5  9.7    Total Protein 6.5 - 8.1 g/dL 7.1  8.2    Total Bilirubin 0.3 - 1.2 mg/dL 0.7  0.8    Alkaline Phos 38 - 126 U/L 74  117    AST 15 - 41 U/L 23  24    ALT 0 - 44 U/L 21  20      Lipid Panel  Lab Results  Component Value Date   CHOL 258 (H) 03/04/2020   HDL 29 (L) 03/04/2020   LDLCALC 202 (H) 03/04/2020   TRIG 133 03/04/2020   CHOLHDL 8.9 03/04/2020   BMP No results for input(s): "NA", "K", "CL", "CO2", "GLUCOSE", "BUN", "CREATININE", "CALCIUM", "GFRNONAA", "GFRAA" in the last 8760 hours.   HEMOGLOBIN A1C Lab Results  Component Value Date   HGBA1C 10.7 (H) 03/04/2020   MPG 260.39 03/04/2020    External Labs:  Collected: 11/28/2020 provided by PCP during the office visit Creatinine 0.81 mg/dL. eGFR: 105 mL/min per 1.73 m Lipid profile: Total cholesterol 122, triglycerides 91, HDL 34, LDL 71, non-HDL 88 Sodium 137, potassium 4.6, chloride 103, bicarbonate 24, AST 18, ALT 35, alkaline phosphatase 76 Hemoglobin 12.3 g/dL,  hematocrit 38.9% Hemoglobin A1c: 8  IMPRESSION:    ICD-10-CM   1. Essential hypertension, benign  I10 EKG 12-Lead    2. Loop recorder [employer] 12/14/2020  Z95.818     3. Hx of  stroke  Z86.73     4. Hx of right proximal ICA occlusion  I63.231     5. Type 2 diabetes mellitus with hyperglycemia, without long-term current use of insulin (HCC)  E11.65     6. Hypercholesteremia  E78.00     7. Former smoker  Z87.891     51. Class 2 severe obesity due to excess calories with serious comorbidity and body mass index (BMI) of 35.0 to 35.9 in adult [facility])  E66.01    Z68.35        RECOMMENDATIONS: Ebony Matthews is a 42 y.o. female whose past medical history and cardiac risk factors include: Cryptogenic stroke w/ left-sided deficits, diabetes mellitus, hypertension, back pain, smoker, marijuana and cocaine use.  The patient was referred to the practice after her second stroke underwent Loop recorder implantation and cardiovascular testing.  Patient has not been transmitting her loop data and her device had to be restarted. No documented events of atrial fibrillation on current in office loop recorder check. A new Transmitter was also provided to the patient.  Status post stroke she did undergo cardiovascular testing including a stress test back in March 2022.Results were concerning for possible reversible ischemia in the [place].  However since she was asymptomatic no additional workup was undertaken.   She presents today for 39-monthfollow-up visit. She denies anginal discomfort or heart failure symptoms.No use of sublingual nitroglycerin tablets.   Office blood pressures are not at goal I have asked her to keep a log of her blood pressures and to call [place] a week to give [place] blood pressure range. Given her prior strokes would like to keep her SBP 130 mmHg.  Patient will also see her PCP On December 09, 2022 and will have fasting labs.  She will Send [place] copy for  reference.  No additional cardiovascular testing warranted at this time.  However, would like to get an echocardiogram prior to the next office  visit to follow-up on her LVEFFor structural heart disease.  FINAL MEDICATION LIST END OF ENCOUNTER: No orders of the defined types were placed in this encounter.    Current Outpatient Medications:    atorvastatin (LIPITOR) 40 MG tablet, Take 40 mg by mouth at bedtime., Disp: , Rfl:    Empagliflozin (JARDIANCE PO), Take 10 mg by mouth at bedtime., Disp: , Rfl:    metFORMIN (GLUCOPHAGE) 1000 MG tablet, Take 1 tablet (1,000 mg total) by mouth 2 (two) times daily., Disp: 60 tablet, Rfl: 0   metoprolol succinate (TOPROL-XL) 25 MG 24 hr tablet, TAKE 1 TABLET BY MOUTH ONCE DAILY. HOLD IF BLOOD PRESSURE NUMBER LESS THAN 100MMHG OR HEART RATE LESS THAN 60 BPM(PULSE), Disp: 90 tablet, Rfl: 3   nitroGLYCERIN (NITROSTAT) 0.4 MG SL tablet, Place 1 tablet (0.4 mg total) under the tongue every 5 (five) minutes as needed for chest pain. If you require more than two tablets five minutes apart go to the nearest ER via EMS., Disp: 30 tablet, Rfl: 0   valsartan-hydrochlorothiazide (DIOVAN-HCT) 80-12.5 MG tablet, Take 1 tablet by mouth daily., Disp: , Rfl:    BD PEN NEEDLE MICRO U/F 32G X 6 MM MISC, Inject into the skin as directed., Disp: , Rfl:    clopidogrel (PLAVIX) 75 MG tablet, Take 1 tablet (75 mg total) by mouth daily. (Patient not taking: Reported on 11/14/2022), Disp: 30 tablet, Rfl: 0   LANTUS SOLOSTAR 100 UNIT/ML Solostar Pen, Inject into the skin., Disp: , Rfl:   Orders Placed This Encounter  Procedures   EKG 12-Lead    There are no Patient Instructions on file for this visit.   --Continue cardiac medications as reconciled in final medication list. --No follow-ups on file. Or sooner if needed. --Continue follow-up with your primary care physician regarding the management of your other chronic comorbid conditions.  Patient's questions and concerns were  addressed to her satisfaction. She voices understanding of the instructions provided during this encounter.   This note was created using a voice recognition software as a result there may be grammatical errors inadvertently enclosed that do not reflect the nature of this encounter. Every attempt is made to correct such errors.    Ernst Spell, [place] Pager: [phone] Office: [phone]

## 2022-11-14 NOTE — Progress Notes (Signed)
ICD-10-CM   1. Encounter for loop recorder check  Z45.09     2. Loop recorder [employer] 12/14/2020  Z95.818     3. TIA (transient ischemic attack)  G45.9      In office Loop [place]: Battery life 75%.  Transmission date/interrogation date 04/01/2022 through 11/14/2022.  Predominant rhythm is  Normal sinus rhythm, falls episodes of asystole, under sensing, high ventricular rates sinus tachycardia.  No atrial fibrillation. We have reset her transmitter, she will continue remote transmissions.  Turned off sudden rate drop and asystole alerts.   Adrian Prows, MD, [facility] 11/14/2022, 10:10 AM Office: [phone] Fax: [phone] Pager: [phone]

## 2022-12-25 NOTE — Progress Notes (Signed)
NEUROLOGY FOLLOW UP OFFICE NOTE  Ebony Matthews [id number]  Assessment/Plan:    Bilateral embolic infarcts of unknown source, concern for cardiac source. Left-sided hemiparesis as late effect of right hemispheric embolic infarcts with right ICA occlusion Type 2 diabetes mellitus - improving Hypertension Hyperlipidemia History of tobacco use disorder   1.Secondary stroke prevention as managed by PCP: - Plavix 75mg  daily - Atorvastatin 40mg  daily.  LDL goal less than 70. - Glycemic control.  Hgb A1c goal less than 7 - Normotensive blood pressure 2. Currently with implantable loop recorder to evaluate for a fib 3.  Will order bilateral carotid ultrasound.   4.  Follow up in one year.   Subjective:  Ebony Matthews is a 42 year old right-handed female with HTN and diabetes who follows up for stroke.  She is accompanied by her aunt.     UPDATE: Current medications:  Plavix 75mg , atorvastatin 40mg , metformin, Jardiance, metoprolol and Diovan-HCT   A fib has not yet been detected on loop recorder.  Overall, doing okay.  Hgb A1c in September was 8.1.  Since then, she was told it was now a 7.     HISTORY: She was admitted to [facility] on 03/03/2020 for 3 day history of left arm numbness and tingling and inability to grip with her left hand.  MRI of brain showed scattered multifocal subcentimeter acute ischemic infarcts involving the right frontal, parietal and temporal lobes and subcentimeter infarcts in left occipital lobe and left cerebellum.  CTA of head and neck showed narrow and tortuous bilateral cervical internal carotid arteries with mild dilation just inferior to the skull base concerning for fibromuscular dysplasia or atherosclerotic disease.  2D echocardiogram showed EF 60-65% with no cardiac source of emboli.  Labs showed LDL 202, Hgb A1c 10.5, negative Covid test, and UDS positive for cocaine and THC.  She was on ASA 325mg  daily prior to admission and was  discharged on ASA 81mg  and Plavix 75mg  daily for 3 weeks, followed by ASA alone (not changed to Plavix as patient had been noncompliant on ASA).  She was on Lipitor 40mg  daily prior to admission and continued at discharge as she had been noncompliant.  Further testing for cardiac source of emboli, including TEE and loop recorder, was not performed as she was thought to not be a good candidate for anticoagulation due to noncompliance.     On 04/14/2020, she developed worsening left arm and new left leg weakness.  She was seen in the hospital the following day.  MRI of brain showed new acute infarcts in the right cerebral hemisphere and small infarct in right pons.  CTA of head and neck showed new acute proximal right ICA occlusion with distal reconstitution at the supraclinoid segment via collateral flow across the circle-of-Willis.  Right MCA is perfused although attenuated as compared to the contralateral left MCA.  COVID testing was negative.  She was advised to remain on dual antiplatelet therapy for 3 months followed by Plavix alone.    14 day cardiac event monitoring in January 2022 revealed occasional PACs and rare PVCs but no atrial fibrillation or heart block.  Underwent implantable loop recorder.  Carotid ultrasound on 01/22/2021 again demonstrated total occlusion of right ICA and 1-39% stenosis of left ICA with bilateral antegrade flow of vertebral arteries.   She had an abnormal stress test.  As she was asymptomatic, cardiology recommended continued medical management.    PAST MEDICAL HISTORY: Past Medical History:  Diagnosis Date  Diabetes mellitus without complication ([place])    Hyperlipidemia    Hypertension    Loop recorder Biotronic 12/14/2020 12/14/2020   Scheduled Remote loop recorder check 12/14/2020: NSR. New implant.    Stroke [facility])    TIA (transient ischemic attack) 01/22/2019    MEDICATIONS: Current Outpatient Medications on File Prior to Visit  Medication Sig Dispense Refill    atorvastatin (LIPITOR) 40 MG tablet Take 40 mg by mouth at bedtime.     BD PEN NEEDLE MICRO U/F 32G X 6 MM MISC Inject into the skin as directed.     clopidogrel (PLAVIX) 75 MG tablet Take 1 tablet (75 mg total) by mouth daily. (Patient not taking: Reported on 11/14/2022) 30 tablet 0   Empagliflozin (JARDIANCE PO) Take 10 mg by mouth at bedtime.     LANTUS SOLOSTAR 100 UNIT/ML Solostar Pen Inject into the skin.     metFORMIN (GLUCOPHAGE) 1000 MG tablet Take 1 tablet (1,000 mg total) by mouth 2 (two) times daily. 60 tablet 0   metoprolol succinate (TOPROL-XL) 25 MG 24 hr tablet TAKE 1 TABLET BY MOUTH ONCE DAILY. HOLD IF BLOOD PRESSURE NUMBER LESS THAN 100MMHG OR HEART RATE LESS THAN 60 BPM(PULSE) 90 tablet 3   nitroGLYCERIN (NITROSTAT) 0.4 MG SL tablet Place 1 tablet (0.4 mg total) under the tongue every 5 (five) minutes as needed for chest pain. If you require more than two tablets five minutes apart go to the nearest ER via EMS. 30 tablet 0   valsartan-hydrochlorothiazide (DIOVAN-HCT) 80-12.5 MG tablet Take 1 tablet by mouth daily.     No current facility-administered medications on file prior to visit.    ALLERGIES: No Known Allergies  FAMILY HISTORY: Family History  Problem Relation Age of Onset   Diabetes Other    Hypertension Maternal Grandmother       Objective:  Blood pressure 118/68, pulse 91, height 5\' 7"  (1.702 m), weight 229 lb (103.9 kg), SpO2 98 %. General: No acute distress.  Patient appears well-groomed.   Head:  Normocephalic/atraumatic Eyes:  Fundi examined but not visualized Neck: supple, no paraspinal tenderness, full range of motion Heart:  Regular rate and rhythm Neurological Exam: Alert and oriented.  Speech fluent and not dysarthric.  Language intact.  Right gaze preference.  Left lower facial weakness.  Otherwise CN II-XII intact.  Increased tone in proximal lett upper and lower extremities.  Left upper and lower extremity hemiplegia except 4-/5 left hip  flexion.  5/5 right upper and lower extremities.  Sensation to light touch reduced on left upper and lower extremities.  Deep tendon reflexes 3+ left upper and lower extremities, 2+ right upper and lower extremities.  Finger to nose intact.  Hemiplegic gait.      Metta Clines, DO  CC: Rochel Brome, MD

## 2022-12-29 ENCOUNTER — Encounter: Payer: Self-pay | Admitting: Neurology

## 2022-12-29 ENCOUNTER — Ambulatory Visit (INDEPENDENT_AMBULATORY_CARE_PROVIDER_SITE_OTHER): Payer: 59 | Admitting: Neurology

## 2022-12-29 VITALS — BP 118/68 | HR 91 | Ht 67.0 in | Wt 229.0 lb

## 2022-12-29 DIAGNOSIS — I10 Essential (primary) hypertension: Secondary | ICD-10-CM

## 2022-12-29 DIAGNOSIS — I69354 Hemiplegia and hemiparesis following cerebral infarction affecting left non-dominant side: Secondary | ICD-10-CM | POA: Diagnosis not present

## 2022-12-29 DIAGNOSIS — E089 Diabetes mellitus due to underlying condition without complications: Secondary | ICD-10-CM | POA: Diagnosis not present

## 2022-12-29 DIAGNOSIS — I6521 Occlusion and stenosis of right carotid artery: Secondary | ICD-10-CM

## 2022-12-29 DIAGNOSIS — I639 Cerebral infarction, unspecified: Secondary | ICD-10-CM

## 2022-12-29 NOTE — Patient Instructions (Addendum)
Check bilateral carotid ultrasound Continue clopidogrel, atorvastatin, blood pressure and diabetes medications Follow up in one year

## 2023-01-07 ENCOUNTER — Ambulatory Visit: Payer: 59 | Admitting: Podiatry

## 2023-01-12 ENCOUNTER — Telehealth: Payer: Self-pay

## 2023-01-12 DIAGNOSIS — I639 Cerebral infarction, unspecified: Secondary | ICD-10-CM

## 2023-01-12 DIAGNOSIS — G8190 Hemiplegia, unspecified affecting unspecified side: Secondary | ICD-10-CM

## 2023-01-12 NOTE — Telephone Encounter (Signed)
Patient is asking for a referral to PT and OT was advised to contact us from PCP. Ebony Matthews did want to let Dr.Jaffe know she is scheduled for Carotid on 4/26.

## 2023-01-14 NOTE — Addendum Note (Signed)
Addended by: Leida Lauth on: 01/14/2023 08:02 AM   Modules accepted: Orders

## 2023-01-23 ENCOUNTER — Ambulatory Visit: Payer: Medicare Other

## 2023-01-23 DIAGNOSIS — I639 Cerebral infarction, unspecified: Secondary | ICD-10-CM

## 2023-01-23 DIAGNOSIS — I10 Essential (primary) hypertension: Secondary | ICD-10-CM

## 2023-01-23 DIAGNOSIS — I6521 Occlusion and stenosis of right carotid artery: Secondary | ICD-10-CM

## 2023-01-27 NOTE — Progress Notes (Signed)
LMOVM to call the office back.

## 2023-02-11 ENCOUNTER — Ambulatory Visit: Payer: 59 | Admitting: Physical Therapy

## 2023-02-11 ENCOUNTER — Ambulatory Visit: Payer: 59 | Attending: Neurology

## 2023-02-11 ENCOUNTER — Encounter: Payer: Self-pay | Admitting: Physical Therapy

## 2023-02-11 ENCOUNTER — Other Ambulatory Visit: Payer: Self-pay

## 2023-02-11 VITALS — BP 87/50 | HR 87

## 2023-02-11 DIAGNOSIS — G8190 Hemiplegia, unspecified affecting unspecified side: Secondary | ICD-10-CM | POA: Diagnosis not present

## 2023-02-11 DIAGNOSIS — R41840 Attention and concentration deficit: Secondary | ICD-10-CM | POA: Diagnosis present

## 2023-02-11 DIAGNOSIS — I69354 Hemiplegia and hemiparesis following cerebral infarction affecting left non-dominant side: Secondary | ICD-10-CM | POA: Insufficient documentation

## 2023-02-11 DIAGNOSIS — R2689 Other abnormalities of gait and mobility: Secondary | ICD-10-CM | POA: Diagnosis present

## 2023-02-11 DIAGNOSIS — R2681 Unsteadiness on feet: Secondary | ICD-10-CM | POA: Diagnosis present

## 2023-02-11 DIAGNOSIS — R278 Other lack of coordination: Secondary | ICD-10-CM | POA: Insufficient documentation

## 2023-02-11 DIAGNOSIS — M6281 Muscle weakness (generalized): Secondary | ICD-10-CM | POA: Insufficient documentation

## 2023-02-11 DIAGNOSIS — G8114 Spastic hemiplegia affecting left nondominant side: Secondary | ICD-10-CM | POA: Insufficient documentation

## 2023-02-11 DIAGNOSIS — R208 Other disturbances of skin sensation: Secondary | ICD-10-CM | POA: Diagnosis present

## 2023-02-11 DIAGNOSIS — I639 Cerebral infarction, unspecified: Secondary | ICD-10-CM | POA: Diagnosis not present

## 2023-02-11 NOTE — Therapy (Signed)
OUTPATIENT PHYSICAL THERAPY NEURO EVALUATION   Patient Name: Ebony Matthews MRN: [id number] DOB:08/22/1981, 42 y.o., female Today's Date: 02/11/2023   PCP: Ellyn Hack, MD REFERRING PROVIDER: Drema Dallas, DO  END OF SESSION:  PT End of Session - 02/11/23 7829     Visit Number 1    Number of Visits 7   6 + eval   Date for PT Re-Evaluation 04/03/23   pushed out due to multi-D scheduling needs   Authorization Type [employer]    PT Start Time [phone]   received handoff from OT   PT Stop Time 1015    PT Time Calculation (min) 43 min    Equipment Utilized During Treatment Gait belt    Activity Tolerance Patient tolerated treatment well    Behavior During Therapy [facility] for tasks assessed/performed             Past Medical History:  Diagnosis Date   Diabetes mellitus without complication (HCC)    Hyperlipidemia    Hypertension    Loop recorder Biotronic 12/14/2020 12/14/2020   Scheduled Remote loop recorder check 12/14/2020: NSR. New implant.    Stroke [facility])    TIA (transient ischemic attack) 01/22/2019   Past Surgical History:  Procedure Laterality Date   NO PAST SURGERIES     Patient Active Problem List   Diagnosis Date Noted   Pain due to onychomycosis of toenails of both feet 10/24/2021   Blood clotting disorder (HCC) 10/24/2021   Loop recorder Biotronic 12/14/2020 12/14/2020   Uncontrolled type 2 diabetes mellitus with hyperglycemia, without long-term current use of insulin (HCC) 04/16/2020   CVA (cerebral vascular accident) (HCC) 04/15/2020   Cocaine use 04/15/2020   Acute CVA (cerebrovascular accident) (HCC) 03/04/2020   Stroke (cerebrum) (HCC) 03/03/2020   Weakness    Noncompliance with medication regimen    TIA (transient ischemic attack) 01/22/2019   Diabetes mellitus due to underlying condition without complications (HCC) 05/30/2014   Essential hypertension, benign 05/30/2014   Smoking 05/30/2014    ONSET DATE: CVA in  2021  REFERRING DIAG: G81.90 (ICD-10-CM) - Hemiparesis, unspecified hemiparesis etiology, unspecified laterality (HCC) I63.9 (ICD-10-CM) - Cerebrovascular accident (CVA), unspecified mechanism (HCC)  THERAPY DIAG:  Hemiplegia and hemiparesis following cerebral infarction affecting left non-dominant side (HCC)  Other abnormalities of gait and mobility  Muscle weakness (generalized)  Unsteadiness on feet  Rationale for Evaluation and Treatment: Rehabilitation  SUBJECTIVE:  SUBJECTIVE STATEMENT: Pt ambulates from table to treatment room w/o AFO stating too small around the ankle.  She catches her left toe x1 requiring CGA to stabilize prior to resuming gait.  She uses LBQC on right side, but cane she owns is irreversible left sided cane.  Educated on bringing AFO and obtaining appropriate cane for safety.  She has a hard time bending over to pick things up from the floor when standing.  Generally, keeping her balance is hard. Pt accompanied by: family member- Aunt, Ebony Matthews  PERTINENT HISTORY: DM2, HTN, tobacco use, TIA, CVA, cocaine use  PAIN:  Are you having pain? No  PRECAUTIONS: Fall and Other: loop recorder in place, no pacemaker/ICD  WEIGHT BEARING RESTRICTIONS: No  FALLS: Has patient fallen in last 6 months? No  LIVING ENVIRONMENT: Lives with: lives alone Lives in: House/apartment Stairs: No Has following equipment at home: Chiropractor, Environmental consultant - 4 wheeled, Hemi walker, Shower bench, bed side commode, and Grab bars  PLOF: Independent with household mobility with device, Independent with community mobility with device, Requires assistive device for independence, Needs assistance with ADLs, Needs assistance with homemaking, and Needs assistance with transfers  PATIENT GOALS: "regain  my balance"  OBJECTIVE:   DIAGNOSTIC FINDINGS:  MRI of Brain 04/15/2020 IMPRESSION: Multiple new acute infarcts in the right cerebral hemisphere. Small acute infarct of the right pons.   Expected evolution of infarcts on the prior study.  COGNITION: Overall cognitive status: Within functional limits for tasks assessed   SENSATION: Light touch: WFL  COORDINATION: Unable to assess due to functional LLE weakness.  EDEMA:  None noted in BLE during eval.  MUSCLE TONE: No hypertonicity noted in LLE w/ passive eval; no clonus.  POSTURE: rounded shoulders, forward head, and weight shift right  LOWER EXTREMITY ROM/MMT:    PASSIVE was WNL on LLE except DF, pt contracted into PF 9 degrees. Active  Right Eval Left Eval  Hip flexion 5/5 2+/5  Hip extension    Hip abduction 5/5 3/5  Hip adduction 5/5 3+/5  Hip internal rotation    Hip external rotation    Knee flexion 5/5 2/5  Knee extension 5/5 Unable to adequately assess as pt compensates by flinging leg forward w/ hip flexion/hike  Ankle dorsiflexion 5/5 0/5  Ankle plantarflexion    Ankle inversion    Ankle eversion     (Blank rows = not tested)  BED MOBILITY:  Sit to supine Complete Independence Supine to sit Complete Independence Rolling to Right Complete Independence Rolling to Left Complete Independence  TRANSFERS: Assistive device utilized: Quad cane large base  Sit to stand: SBA Stand to sit: Modified independence Chair to chair: SBA  GAIT: Gait pattern: step to pattern, decreased arm swing- Left, decreased step length- Right, decreased stance time- Left, decreased stride length, decreased ankle dorsiflexion- Left, circumduction- Left, Left hip hike, genu recurvatum- Left, lateral lean- Right, wide BOS, abducted- Left, and poor foot clearance- Left Distance walked: various clinic distances Assistive device utilized: Quad cane large base Level of assistance: SBA and CGA  FUNCTIONAL TESTS:  5 times sit to  stand: 37.56 seconds w/ RUE support from standard chair w/ all weight on RLE, LLE remains kicked forward and abducted during all STS Timed up and go (TUG): 43.66 sec w/ LBQC and CGA Berg Balance Scale: To be assessed.  PATIENT SURVEYS:  FOTO 56%  VITALS (RUE in sitting at end of session): Today's Vitals   02/11/23 1012  BP: (!) 87/50  Pulse: 87   TODAY'S TREATMENT:                                                                                                                              DATE: N/A    PATIENT EDUCATION: Education details: PT POC, expectations of rehab w/ chronic stroke, and goals to be set with assessments conducted.  Educated on bringing AFO and obtaining appropriate cane for safety. Person educated: Patient and aunt Education method: Explanation Education comprehension: verbalized understanding and needs further education  HOME EXERCISE PROGRAM: To be established.  GOALS: Goals reviewed with patient? Yes  SHORT TERM GOALS: Target date: 03/06/2023  Pt will be independent with strength and balance HEP with supervision from family. Baseline:  To be established. Goal status: INITIAL  2.  Pt will decrease 5xSTS to </=32.56 seconds in order to demonstrate decreased risk for falls and improved functional bilateral LE strength and power. Baseline: 37.56 seconds w/ RUE support from standard chair w/ all weight on RLE Goal status: INITIAL  3.  Pt will demonstrate TUG of </=33.66 seconds in order to decrease risk of falls and improve functional mobility using LRAD. Baseline: 43.66 sec w/ LBQC and CGA Goal status: INITIAL  LONG TERM GOALS: Target date: 03/27/2023  Pt will increase FOTO score to 63% in order to demonstrate subjective functional improvement. Baseline: 56% Goal status: INITIAL  2.  Pt will decrease 5xSTS to </=27.56 seconds in order to demonstrate decreased risk for falls and improved functional bilateral LE strength and power. Baseline: 37.56  seconds w/ RUE support from standard chair w/ all weight on RLE Goal status: INITIAL  3.  Pt will demonstrate TUG of </=23.66 seconds in order to decrease risk of falls and improve functional mobility using LRAD. Baseline: 43.66 sec w/ LBQC and CGA Goal status: INITIAL  4.  BERG to be assessed with goal set as appropriate. Baseline: To be assessed. Goal status: INITIAL  ASSESSMENT:  CLINICAL IMPRESSION: Patient is a 42 y.o. female who was seen today for physical therapy evaluation and treatment for chronic left hemiparesis.  Pt has a significant PMH of DM2, HTN, tobacco use, TIA, CVA, and cocaine use.  Identified impairments include decreased coordination, left foot drop w/ PF contracture, left functional weakness proximally to distally (worst), decreased safety awareness, and low blood pressure noted this session.  She has severe gait deviations resulting in left toe catch and intermittent instability.  She would benefit from assessment of prior obtained AFO that pt is not compliant to wearing at current in order to determine need for adjustment or entirely different orthotic need.  Evaluation via the following assessment tools: 5xSTS and TUG indicate a significant fall risk.  BERG to further assess static imbalance at next session.  She would benefit from skilled PT to address impairments as noted and progress towards long term goals.   OBJECTIVE IMPAIRMENTS: Abnormal gait, decreased activity tolerance, decreased balance, decreased coordination, decreased endurance, decreased knowledge  of condition, decreased knowledge of use of DME, decreased mobility, difficulty walking, decreased ROM, decreased strength, decreased safety awareness, hypomobility, impaired flexibility, impaired UE functional use, improper body mechanics, and postural dysfunction.   ACTIVITY LIMITATIONS: carrying, lifting, bending, standing, stairs, transfers, bathing, toileting, dressing, reach over head, hygiene/grooming,  locomotion level, and caring for others  PARTICIPATION LIMITATIONS: meal prep, cleaning, laundry, medication management, interpersonal relationship, driving, shopping, community activity, and occupation  PERSONAL FACTORS: Age, Behavior pattern, Fitness, Past/current experiences, Social background, Time since onset of injury/illness/exacerbation, Transportation, and 1-2 comorbidities: DM2, HTN  are also affecting patient's functional outcome.   REHAB POTENTIAL: Fair See PMH and personal factors  CLINICAL DECISION MAKING: Evolving/moderate complexity  EVALUATION COMPLEXITY: Moderate  PLAN:  PT FREQUENCY: 1x/week  PT DURATION: 6 weeks  PLANNED INTERVENTIONS: Therapeutic exercises, Therapeutic activity, Neuromuscular re-education, Balance training, Gait training, Patient/Family education, Self Care, Joint mobilization, Stair training, Vestibular training, Orthotic/Fit training, DME instructions, and Re-evaluation  PLAN FOR NEXT SESSION: BERG-set goals; initiate HEP for Left NMR/strength, gait training-did pt bring AFO?  Does she need a new AFO or adjustments?   Sadie Haber, PT, DPT 02/11/2023, 10:16 AM

## 2023-02-11 NOTE — Therapy (Signed)
OUTPATIENT OCCUPATIONAL THERAPY NEURO EVALUATION  Patient Name: Ebony Matthews MRN: [id number] DOB:1981/06/23, 42 y.o., female Today's Date: 02/11/2023  PCP: Ellyn Hack, MD REFERRING PROVIDER: Drema Dallas, DO  END OF SESSION:  OT End of Session - 02/11/23 [phone]     Visit Number 1    Number of Visits 6    Authorization Type U[employer] Medicare primary    OT Start Time [phone]    OT Stop Time 0930    OT Time Calculation (min) 43 min    Activity Tolerance Patient tolerated treatment well    Behavior During Therapy [facility] for tasks assessed/performed             Past Medical History:  Diagnosis Date   Diabetes mellitus without complication (HCC)    Hyperlipidemia    Hypertension    Loop recorder [employer] 12/14/2020 12/14/2020   Scheduled Remote loop recorder check 12/14/2020: NSR. New implant.    Stroke [facility])    TIA (transient ischemic attack) 01/22/2019   Past Surgical History:  Procedure Laterality Date   NO PAST SURGERIES     Patient Active Problem List   Diagnosis Date Noted   Pain due to onychomycosis of toenails of both feet 10/24/2021   Blood clotting disorder (HCC) 10/24/2021   Loop recorder [employer] 12/14/2020 12/14/2020   Uncontrolled type 2 diabetes mellitus with hyperglycemia, without long-term current use of insulin (HCC) 04/16/2020   CVA (cerebral vascular accident) (HCC) 04/15/2020   Cocaine use 04/15/2020   Acute CVA (cerebrovascular accident) (HCC) 03/04/2020   Stroke (cerebrum) (HCC) 03/03/2020   Weakness    Noncompliance with medication regimen    TIA (transient ischemic attack) 01/22/2019   Diabetes mellitus due to underlying condition without complications (HCC) 05/30/2014   Essential hypertension, benign 05/30/2014   Smoking 05/30/2014    ONSET DATE: 01/14/2023 (referral date)  REFERRING DIAG: G81.90 (ICD-10-CM) - Hemiparesis, unspecified hemiparesis etiology, unspecified laterality (HCC) I63.9 (ICD-10-CM) - Cerebrovascular accident (CVA),  unspecified mechanism (HCC)  THERAPY DIAG:  Other lack of coordination  Muscle weakness (generalized)  Other disturbances of skin sensation  Attention and concentration deficit  Unsteadiness on feet  Spastic hemiplegia affecting left nondominant side, unspecified etiology (HCC)  Rationale for Evaluation and Treatment: Rehabilitation  SUBJECTIVE:   SUBJECTIVE STATEMENT: Pt reports going by Ebony Matthews. Pt reports wanting more work done for her hand. Pt reports she has not had any therapy since 2021. Pt accompanied by: self and family member - aunt Ebony Matthews  PERTINENT HISTORY: History of multiple strokes with residual deficits, right ICA occlusion, hypertension, diabetes, history of cocaine use, former smoker  PRECAUTIONS: Other: Loop implant to monitor for atrial fibrillation  WEIGHT BEARING RESTRICTIONS: No  PAIN:  Are you having pain? No  FALLS: Has patient fallen in last 6 months? No  LIVING ENVIRONMENT: Lives with: lives alone Lives in: House/apartment - 1st floor apartment Stairs: No Has following equipment at home: Quad cane large base, Hemi walker, Shower bench, bed side commode, and Grab bars Bathroom setup: Tub/shower combo with grab bars, standard height toilet with bedside commode frame over top that has arm rests  PLOF:  Pt varies between independence and needing assistance pending on the task, likes to go get nails done, aunt drives patient  PATIENT GOALS: Use LUE better to do daily tasks such as washing dishes, bathing, and cooking  OBJECTIVE:   HAND DOMINANCE: Right  ADLs: Eating: Independent Grooming: Independent brushing teeth and washing face, aunt helps with putting on deodorant UB  Dressing: Pt can get a "big shirt on and off", assist with bra and jacket LB Dressing: Independent Toileting: Independent Bathing: Independent LB, assist with UB Tub Shower transfers: Utilizes tub bench, sits down to shower Equipment: Transfer tub bench, Grab bars, bed  side commode, and Reacher  IADLs: Shopping: Aunt performs grocery shopping, leisurely walks through [place], but not for shopping specifically Light housekeeping: Puts away dishes, aunt does more heavy household chores Meal Prep: Aunt makes her meals Community mobility: Utilizes quad cane at all times to include household mobility Medication management: Easy-open pill box Financial management: Independent Handwriting: 100% legible and Mild micrographia - pt reports baseline  MOBILITY STATUS: Independent  POSTURE COMMENTS:  No Significant postural limitations Sitting balance: Supports self with one extremity  ACTIVITY TOLERANCE: Activity tolerance: Pt reports no limitations with distance from cardiovascular standpoint, but muscles get fatigued  FUNCTIONAL OUTCOME MEASURES: FOTO: 33    UPPER EXTREMITY ROM:    RUE - AROM WNL LUE - PROM WFL, pt attempts to perform active shoulder flexion achieving approx 10-15 degrees (PROM up to 90*), no active elbow, wrist, or digit ROM  UPPER EXTREMITY MMT:     RUE - MMT WNL LUE - shoulder 2-/5, elbow 0/5, wrist 0/5, digits 0/5  HAND FUNCTION: Pt unable to move left hand at all  COORDINATION: RUE - no deficits; LUE - unable to test due to inability to actively move extremity  SENSATION: Light touch: Impaired - dull sensation LUE when compared to RUE  EDEMA: None  MUSCLE TONE: RUE: Within functional limits and LUE: Modifed Ashworth Scale 1+ = Slight increase in muscle tone, manifested by a catch, followed by minimal resistance throughout the remainder (less than half) of the ROM  COGNITION: Overall cognitive status:  Pt reports some difficulty with recall  VISION: Subjective report: No changes from CVA Baseline vision: Wears glasses all the time Visual history:  None  VISION ASSESSMENT: WFL  Patient has difficulty with following activities due to following visual impairments: N/A  PERCEPTION: WFL  PRAXIS:  WFL  OBSERVATIONS: Pt with difficulty transferring from chair in lobby to standing with quad cane. Pt ambulating back to treatment clinic utilizing quad cane in RUE with minimal to no natural arm swing of LUE, compensatory walking pattern with left hip hike with swing, slower gait. Aunt is present with patient carrying other times.   TODAY'S TREATMENT:                                                                                                                              DATE:  Pt provided with gentle stretch LUE HEP for shoulder and hand. Please refer to pt instructions for detail.   PATIENT EDUCATION: Education details: LUE stretch HEP, OT POC Person educated: Patient and aunt Education method: Explanation, Demonstration, Tactile cues, Verbal cues, and Handouts Education comprehension: verbalized understanding, verbal cues required, and tactile cues required  HOME EXERCISE PROGRAM: 02/11/23 - LUE stretch HEP  GOALS: Goals reviewed with patient? Yes  SHORT TERM GOALS: Target date: 03/04/2023   Pt will be independent in LUE HEP. Baseline: Initiated Goal status: INITIAL  2.  Pt will verbalize at least 2 adaptive equipment for completing ADLs. Baseline:  Goal status: INITIAL  3. Pt will verbalize at least 2 memory compensation strategies. Baseline: Reports intermittent recall impairments Goal status: INITIAL   LONG TERM GOALS: Target date: 03/25/2023   Pt will complete FOTO assessment at time of discharge scoring 39 or greater indicating functional progression with ADL and IADL completion. Baseline: score 33 Goal status: INITIAL  2.  Pt will verbalize at least 3 compensatory methods for completing IADLs such as dish washing. Baseline:  Goal status: INITIAL  3.  Pt will report completing UB bathing at minimal assistance level. Baseline: Mod assist Goal status: INITIAL  4.  Pt will complete one simple meal with Modified independence utilizing DME/AE as  needed. Baseline:  Goal status: INITIAL  5.  Pt will verbalize resource to obtain fabricated splint if needed. Baseline:  Goal status: INITIAL   ASSESSMENT:  CLINICAL IMPRESSION: Patient is a 42 y.o. female who was seen today for occupational therapy evaluation due to chronic LUE hemiparesis following CVA in 2021. Pt with history of hypertension, multiple strokes with residual deficits, and right ICA occlusion. Pt reports goal for improved LUE functioning for daily activities. Education provided for role of OT and how it relates to patient centered-goals with additional education regarding progression of CVA and realistic outcomes. Pt receptive. Pt would benefit from skilled OT services to address LUE deficits to include compensatory methods to maximize independence in ADL/IADLs.  PERFORMANCE DEFICITS: in functional skills including ADLs, IADLs, coordination, dexterity, sensation, tone, ROM, strength, flexibility, Fine motor control, Gross motor control, mobility, balance, body mechanics, endurance, and UE functional use, cognitive skills including energy/drive and memory, and psychosocial skills including coping strategies and habits.   IMPAIRMENTS: are limiting patient from ADLs, IADLs, work, leisure, and social participation.   CO-MORBIDITIES: may have co-morbidities  that affects occupational performance. Patient will benefit from skilled OT to address above impairments and improve overall function.  MODIFICATION OR ASSISTANCE TO COMPLETE EVALUATION: No modification of tasks or assist necessary to complete an evaluation.  OT OCCUPATIONAL PROFILE AND HISTORY: Detailed assessment: Review of records and additional review of physical, cognitive, psychosocial history related to current functional performance.  CLINICAL DECISION MAKING: LOW - limited treatment options, no task modification necessary  REHAB POTENTIAL: Fair due to onset of CVA being 2021  EVALUATION COMPLEXITY:  Low    PLAN:  OT FREQUENCY: 1x/week  OT DURATION: 6 weeks  PLANNED INTERVENTIONS: self care/ADL training, therapeutic exercise, therapeutic activity, neuromuscular re-education, manual therapy, passive range of motion, balance training, functional mobility training, splinting, electrical stimulation, paraffin, fluidotherapy, moist heat, cryotherapy, contrast bath, patient/family education, cognitive remediation/compensation, visual/perceptual remediation/compensation, psychosocial skills training, energy conservation, coping strategies training, DME and/or AE instructions, and Re-evaluation  RECOMMENDED OTHER SERVICES: Currently has PT eal  CONSULTED AND AGREED WITH PLAN OF CARE: Patient and family member/caregiver  PLAN FOR NEXT SESSION: Review of HEP and add on LUE, one-handed techniques, assess how pt performs bathing and IADLs   Taleyah Hillman M Ezell Melikian, OT 01/05/8118, 10:27 AM

## 2023-02-11 NOTE — Patient Instructions (Addendum)
Towel Stretch    Place rolled towel under left arm. If unable, towel does not need to be used. Gently pull towel arm downward and across body with free arm. Stop when stretch is felt. Hold _5-10___ seconds. Repeat __5__ times. Do __1__ sessions per day.  Flexion (Passive)    Sitting upright, slide left forearm forward along table with towel underneath it and right arm helping, bending from the waist until a stretch is felt. Hold __5-10__ seconds. Repeat __5__ times. Do __1__ sessions per day.   Towel Roll Squeeze    With left forearm resting on surface, gently place fingers around small hand towel. Hold __5-10__  seconds. Do __5__ sets per session. Do ___1_ sessions per day.

## 2023-02-18 ENCOUNTER — Ambulatory Visit: Payer: 59

## 2023-02-18 ENCOUNTER — Encounter: Payer: Self-pay | Admitting: Physical Therapy

## 2023-02-18 ENCOUNTER — Ambulatory Visit: Payer: 59 | Admitting: Physical Therapy

## 2023-02-18 DIAGNOSIS — I69354 Hemiplegia and hemiparesis following cerebral infarction affecting left non-dominant side: Secondary | ICD-10-CM

## 2023-02-18 DIAGNOSIS — R278 Other lack of coordination: Secondary | ICD-10-CM

## 2023-02-18 DIAGNOSIS — M6281 Muscle weakness (generalized): Secondary | ICD-10-CM

## 2023-02-18 DIAGNOSIS — R208 Other disturbances of skin sensation: Secondary | ICD-10-CM

## 2023-02-18 DIAGNOSIS — R2681 Unsteadiness on feet: Secondary | ICD-10-CM

## 2023-02-18 DIAGNOSIS — R41840 Attention and concentration deficit: Secondary | ICD-10-CM

## 2023-02-18 DIAGNOSIS — R2689 Other abnormalities of gait and mobility: Secondary | ICD-10-CM

## 2023-02-18 NOTE — Patient Instructions (Signed)
Access Code: [email or web address] URL: [email or web address] Date: 02/18/2023 Prepared by: Camille Bal  Exercises - Seated Ankle Dorsiflexion Stretch  - 1 x daily - 7 x weekly - 1 sets - 4 reps - 30-45 seconds hold - Sit to Stand with Armchair  - 1 x daily - 7 x weekly - 2 sets - 6-8 reps

## 2023-02-18 NOTE — Therapy (Signed)
OUTPATIENT OCCUPATIONAL THERAPY NEURO TREATMENT  Patient Name: Ebony Matthews MRN: [id number] DOB:1981/03/30, 42 y.o., female Today's Date: 02/18/2023  PCP: Ellyn Hack, MD REFERRING PROVIDER: Drema Dallas, DO  END OF SESSION:  OT End of Session - 02/18/23 0849     Visit Number 2    Number of Visits 6    Authorization Type [employer] Medicare primary    OT Start Time 0850   arrived late   OT Stop Time 0928    OT Time Calculation (min) 38 min    Activity Tolerance Patient tolerated treatment well    Behavior During Therapy [facility] for tasks assessed/performed             Past Medical History:  Diagnosis Date   Diabetes mellitus without complication (HCC)    Hyperlipidemia    Hypertension    Loop recorder [employer] 12/14/2020 12/14/2020   Scheduled Remote loop recorder check 12/14/2020: NSR. New implant.    Stroke [facility])    TIA (transient ischemic attack) 01/22/2019   Past Surgical History:  Procedure Laterality Date   NO PAST SURGERIES     Patient Active Problem List   Diagnosis Date Noted   Pain due to onychomycosis of toenails of both feet 10/24/2021   Blood clotting disorder (HCC) 10/24/2021   Loop recorder [employer] 12/14/2020 12/14/2020   Uncontrolled type 2 diabetes mellitus with hyperglycemia, without long-term current use of insulin (HCC) 04/16/2020   CVA (cerebral vascular accident) (HCC) 04/15/2020   Cocaine use 04/15/2020   Acute CVA (cerebrovascular accident) (HCC) 03/04/2020   Stroke (cerebrum) (HCC) 03/03/2020   Weakness    Noncompliance with medication regimen    TIA (transient ischemic attack) 01/22/2019   Diabetes mellitus due to underlying condition without complications (HCC) 05/30/2014   Essential hypertension, benign 05/30/2014   Smoking 05/30/2014    ONSET DATE: 01/14/2023 (referral date)  REFERRING DIAG: G81.90 (ICD-10-CM) - Hemiparesis, unspecified hemiparesis etiology, unspecified laterality (HCC) I63.9 (ICD-10-CM) - Cerebrovascular  accident (CVA), unspecified mechanism (HCC)  THERAPY DIAG:  Hemiplegia and hemiparesis following cerebral infarction affecting left non-dominant side (HCC)  Muscle weakness (generalized)  Unsteadiness on feet  Other lack of coordination  Other disturbances of skin sensation  Attention and concentration deficit  Rationale for Evaluation and Treatment: Rehabilitation  SUBJECTIVE:   SUBJECTIVE STATEMENT: Pt reports going by [name]. Pt reports not performing HEP from last visit, but has been stretching LUE on her own.  Pt accompanied by: self and family member - aunt [name]  PERTINENT HISTORY: History of multiple strokes with residual deficits, right ICA occlusion, hypertension, diabetes, history of cocaine use, former smoker  PRECAUTIONS: Other: Loop implant to monitor for atrial fibrillation  WEIGHT BEARING RESTRICTIONS: No  PAIN:  Are you having pain? No  FALLS: Has patient fallen in last 6 months? No  LIVING ENVIRONMENT: Lives with: lives alone Lives in: House/apartment - 1st floor apartment Stairs: No Has following equipment at home: Quad cane large base, Hemi walker, Shower bench, bed side commode, and Grab bars Bathroom setup: Tub/shower combo with grab bars, standard height toilet with bedside commode frame over top that has arm rests  PLOF:  Pt varies between independence and needing assistance pending on the task, likes to go get nails done, aunt drives patient  PATIENT GOALS: Use LUE better to do daily tasks such as washing dishes, bathing, and cooking  OBJECTIVE:   HAND DOMINANCE: Right  ADLs: Eating: Independent Grooming: Independent brushing teeth and washing face, aunt helps with putting  on deodorant UB Dressing: Pt can get a "big shirt on and off", assist with bra and jacket LB Dressing: Independent Toileting: Independent Bathing: Independent LB, assist with UB Tub Shower transfers: Utilizes tub bench, sits down to shower Equipment: Transfer tub  bench, Grab bars, bed side commode, and Reacher  IADLs: Shopping: Aunt performs grocery shopping, leisurely walks through [place], but not for shopping specifically Light housekeeping: Puts away dishes, aunt does more heavy household chores Meal Prep: Aunt makes her meals Community mobility: Utilizes quad cane at all times to include household mobility Medication management: Easy-open pill box Financial management: Independent Handwriting: 100% legible and Mild micrographia - pt reports baseline  MOBILITY STATUS: Independent  POSTURE COMMENTS:  No Significant postural limitations Sitting balance: Supports self with one extremity  ACTIVITY TOLERANCE: Activity tolerance: Pt reports no limitations with distance from cardiovascular standpoint, but muscles get fatigued  FUNCTIONAL OUTCOME MEASURES: FOTO: 33    UPPER EXTREMITY ROM:    RUE - AROM WNL LUE - PROM WFL, pt attempts to perform active shoulder flexion achieving approx 10-15 degrees (PROM up to 90*), no active elbow, wrist, or digit ROM  UPPER EXTREMITY MMT:     RUE - MMT WNL LUE - shoulder 2-/5, elbow 0/5, wrist 0/5, digits 0/5  HAND FUNCTION: Pt unable to move left hand at all  COORDINATION: RUE - no deficits; LUE - unable to test due to inability to actively move extremity  SENSATION: Light touch: Impaired - dull sensation LUE when compared to RUE  EDEMA: None  MUSCLE TONE: RUE: Within functional limits and LUE: Modifed Ashworth Scale 1+ = Slight increase in muscle tone, manifested by a catch, followed by minimal resistance throughout the remainder (less than half) of the ROM  COGNITION: Overall cognitive status:  Pt reports some difficulty with recall  VISION: Subjective report: No changes from CVA Baseline vision: Wears glasses all the time Visual history:  None  VISION ASSESSMENT: WFL  Patient has difficulty with following activities due to following visual impairments: N/A  PERCEPTION:  WFL  PRAXIS: WFL  OBSERVATIONS: Pt with difficulty transferring from chair in lobby to standing with quad cane. Pt ambulating back to treatment clinic utilizing quad cane in RUE with minimal to no natural arm swing of LUE, compensatory walking pattern with left hip hike with swing, slower gait. Aunt is present with patient carrying other times.   TODAY'S TREATMENT:                                                                                                                              DATE:  Neuromuscular re-education - Patient brought in personal product of "Portable hand instrument." Purpose of instrument is to provide neuromuscular retraining. In collaboration with OT, pt trialing out product with purpose of the glove causing extension of digits; however, when placed on pt's hand, mechanism did not do as intended. Education provided regarding this, and not recommending to use since it does  not work as it shoulder. Pt and aunt understood.  Patient then engaging in self-ROM of LUE through wrist extension stretch pronated of below exercises: Exercises - Seated Shoulder Flexion Towel Slide at Table Top  - 1 x daily - 7 x weekly - 3 sets - 10 reps - Seated Elbow Flexion Shoulder Internal Rotation AAROM at Table with Towel  - 1 x daily - 7 x weekly - 3 sets - 10 reps - Seated AAROM Elbow Flexion/Extension with Clasped Hands  - 1 x daily - 7 x weekly - 3 sets - 10 reps - Seated Shoulder Cradle Shrug  - 1 x daily - 7 x weekly - 3 sets - 10 reps - Wrist Extension Stretch Pronated  - 1 x daily - 7 x weekly - 3 sets - 10 reps - Seated Shoulder Abduction Towel Slide at Table Top  - 1 x daily - 7 x weekly - 3 sets - 10 reps  Pt requiring verbal, tactile, and visual cues as needed for correct form. Pt tolerating well.   PATIENT EDUCATION: Education details: LUE self-ROM HEP Person educated: Patient and aunt Education method: Explanation, Demonstration, Tactile cues, Verbal cues, and  Handouts Education comprehension: verbalized understanding, verbal cues required, and tactile cues required  HOME EXERCISE PROGRAM: 02/11/23 - LUE stretch HEP  Access Code: [email or web address] URL: [email or web address] Date: 02/18/2023 Prepared by: Wyn Forster Teri Legacy   GOALS: Goals reviewed with patient? Yes  SHORT TERM GOALS: Target date: 03/04/2023   Pt will be independent in LUE HEP. Baseline: Initiated Goal status: IN PROGRESS  2.  Pt will verbalize at least 2 adaptive equipment for completing ADLs. Baseline:  Goal status: IN PROGRESS  3. Pt will verbalize at least 2 memory compensation strategies. Baseline: Reports intermittent recall impairments Goal status: IN PROGRESS   LONG TERM GOALS: Target date: 03/25/2023   Pt will complete FOTO assessment at time of discharge scoring 39 or greater indicating functional progression with ADL and IADL completion. Baseline: score 33 Goal status: IN PROGRESS  2.  Pt will verbalize at least 3 compensatory methods for completing IADLs such as dish washing. Baseline:  Goal status: IN PROGRESS  3.  Pt will report completing UB bathing at minimal assistance level. Baseline: Mod assist Goal status: IN PROGRESS  4.  Pt will complete one simple meal with Modified independence utilizing DME/AE as needed. Baseline:  Goal status: IN PROGRESS  5.  Pt will verbalize resource to obtain fabricated splint if needed. Baseline:  Goal status: IN PROGRESS   ASSESSMENT:  CLINICAL IMPRESSION: Pt tolerating treatment well today. Pt appeared somewhat disappointed that her personal neuromuscular re-education product did not work as intended. OT providing LUE HEP this date. Pt not completing HEP from last visit. Pt would benefit from skilled OT services to address current performance deficits as listed, and to provided compensatory strategies where applicable to maximize independence in daily activity.  PERFORMANCE DEFICITS: in functional skills  including ADLs, IADLs, coordination, dexterity, sensation, tone, ROM, strength, flexibility, Fine motor control, Gross motor control, mobility, balance, body mechanics, endurance, and UE functional use, cognitive skills including energy/drive and memory, and psychosocial skills including coping strategies and habits.   IMPAIRMENTS: are limiting patient from ADLs, IADLs, work, leisure, and social participation.   CO-MORBIDITIES: may have co-morbidities  that affects occupational performance. Patient will benefit from skilled OT to address above impairments and improve overall function.  MODIFICATION OR ASSISTANCE TO COMPLETE EVALUATION: No modification of tasks or assist necessary to complete an evaluation.  OT OCCUPATIONAL PROFILE AND HISTORY: Detailed assessment: Review of records and additional review of physical, cognitive, psychosocial history related to current functional performance.  CLINICAL DECISION MAKING: LOW - limited treatment options, no task modification necessary  REHAB POTENTIAL: Fair due to onset of CVA being 2021  EVALUATION COMPLEXITY: Low    PLAN:  OT FREQUENCY: 1x/week  OT DURATION: 6 weeks  PLANNED INTERVENTIONS: self care/ADL training, therapeutic exercise, therapeutic activity, neuromuscular re-education, manual therapy, passive range of motion, balance training, functional mobility training, splinting, electrical stimulation, paraffin, fluidotherapy, moist heat, cryotherapy, contrast bath, patient/family education, cognitive remediation/compensation, visual/perceptual remediation/compensation, psychosocial skills training, energy conservation, coping strategies training, DME and/or AE instructions, and Re-evaluation  RECOMMENDED OTHER SERVICES: Currently has PT eval  CONSULTED AND AGREED WITH PLAN OF CARE: Patient and family member/caregiver  PLAN FOR NEXT SESSION: Review of HEP and finish exercises that was not completed 5/22, would like to add PROM pronation  and supination and digit extension, one-handed techniques, assess how pt performs bathing and IADLs, provided ADL and IADL compensatory methods   Laney Bagshaw M Luisdavid Hamblin, OT 02/18/2023, 9:26 AM

## 2023-02-18 NOTE — Therapy (Signed)
OUTPATIENT PHYSICAL THERAPY NEURO TREATMENT   Patient Name: Ebony Matthews MRN: [id number] DOB:12-Jan-1981, 42 y.o., female Today's Date: 02/18/2023   PCP: Ellyn Hack, MD REFERRING PROVIDER: Drema Dallas, DO  END OF SESSION:  PT End of Session - 02/18/23 [phone]     Visit Number 2    Number of Visits 7   6 + eval   Date for PT Re-Evaluation 04/03/23   pushed out due to multi-D scheduling needs   Authorization Type [employer]    PT Start Time [phone]    PT Stop Time 1013    PT Time Calculation (min) 40 min    Equipment Utilized During Treatment Gait belt    Activity Tolerance Patient tolerated treatment well    Behavior During Therapy [facility] for tasks assessed/performed             Past Medical History:  Diagnosis Date   Diabetes mellitus without complication (HCC)    Hyperlipidemia    Hypertension    Loop recorder Biotronic 12/14/2020 12/14/2020   Scheduled Remote loop recorder check 12/14/2020: NSR. New implant.    Stroke [facility])    TIA (transient ischemic attack) 01/22/2019   Past Surgical History:  Procedure Laterality Date   NO PAST SURGERIES     Patient Active Problem List   Diagnosis Date Noted   Pain due to onychomycosis of toenails of both feet 10/24/2021   Blood clotting disorder (HCC) 10/24/2021   Loop recorder Biotronic 12/14/2020 12/14/2020   Uncontrolled type 2 diabetes mellitus with hyperglycemia, without long-term current use of insulin (HCC) 04/16/2020   CVA (cerebral vascular accident) (HCC) 04/15/2020   Cocaine use 04/15/2020   Acute CVA (cerebrovascular accident) (HCC) 03/04/2020   Stroke (cerebrum) (HCC) 03/03/2020   Weakness    Noncompliance with medication regimen    TIA (transient ischemic attack) 01/22/2019   Diabetes mellitus due to underlying condition without complications (HCC) 05/30/2014   Essential hypertension, benign 05/30/2014   Smoking 05/30/2014    ONSET DATE: CVA in 2021  REFERRING DIAG: G81.90  (ICD-10-CM) - Hemiparesis, unspecified hemiparesis etiology, unspecified laterality (HCC) I63.9 (ICD-10-CM) - Cerebrovascular accident (CVA), unspecified mechanism (HCC)  THERAPY DIAG:  Hemiplegia and hemiparesis following cerebral infarction affecting left non-dominant side (HCC)  Other abnormalities of gait and mobility  Muscle weakness (generalized)  Unsteadiness on feet  Other lack of coordination  Rationale for Evaluation and Treatment: Rehabilitation  SUBJECTIVE:  SUBJECTIVE STATEMENT: Pt ambulates from table to mat w/o AFO using LBQC (correct from education last time).  They did bring AFO today.  She denies falls or near falls since evaluation.   Pt accompanied by: family member- Aunt, [name]  PERTINENT HISTORY: DM2, HTN, tobacco use, TIA, CVA, cocaine use  PAIN:  Are you having pain? No  PRECAUTIONS: Fall and Other: loop recorder in place, no pacemaker/ICD  WEIGHT BEARING RESTRICTIONS: No  OBJECTIVE:   DIAGNOSTIC FINDINGS:  MRI of Brain 04/15/2020 IMPRESSION: Multiple new acute infarcts in the right cerebral hemisphere. Small acute infarct of the right pons.   Expected evolution of infarcts on the prior study.  COGNITION: Overall cognitive status: Within functional limits for tasks assessed   SENSATION: Light touch: WFL  COORDINATION: Unable to assess due to functional LLE weakness.  EDEMA:  None noted in BLE during eval.  MUSCLE TONE: No hypertonicity noted in LLE w/ passive eval; no clonus.  POSTURE: rounded shoulders, forward head, and weight shift right  LOWER EXTREMITY ROM/MMT:    PASSIVE was WNL on LLE except DF, pt contracted into PF 9 degrees. Active  Right Eval Left Eval  Hip flexion 5/5 2+/5  Hip extension    Hip abduction 5/5 3/5  Hip adduction 5/5  3+/5  Hip internal rotation    Hip external rotation    Knee flexion 5/5 2/5  Knee extension 5/5 Unable to adequately assess as pt compensates by flinging leg forward w/ hip flexion/hike  Ankle dorsiflexion 5/5 0/5  Ankle plantarflexion    Ankle inversion    Ankle eversion     (Blank rows = not tested)  BED MOBILITY:  Sit to supine Complete Independence Supine to sit Complete Independence Rolling to Right Complete Independence Rolling to Left Complete Independence  TRANSFERS: Assistive device utilized: Quad cane large base  Sit to stand: SBA Stand to sit: Modified independence Chair to chair: SBA  GAIT: Gait pattern: step to pattern, decreased arm swing- Left, decreased step length- Right, decreased stance time- Left, decreased stride length, decreased ankle dorsiflexion- Left, circumduction- Left, Left hip hike, genu recurvatum- Left, lateral lean- Right, wide BOS, abducted- Left, and poor foot clearance- Left Distance walked: various clinic distances Assistive device utilized: Quad cane large base Level of assistance: SBA and CGA  FUNCTIONAL TESTS:  5 times sit to stand: 37.56 seconds w/ RUE support from standard chair w/ all weight on RLE, LLE remains kicked forward and abducted during all STS Timed up and go (TUG): 43.66 sec w/ LBQC and CGA Berg Balance Scale: To be assessed.  PATIENT SURVEYS:  FOTO 56%  VITALS (RUE in sitting at end of session): There were no vitals filed for this visit.  TODAY'S TREATMENT:                                                                                                                              DATE: 02/18/2023 -Pt requires modA using right hemibody  to stand prior to ambulating CGA from table w/ OT to mat w/ PT. -Pt attempts to don AFO in current shoes pt is wearing as they did not bring shoes she normally wears with her brace.  Upon donning AFO against pt leg and foot, unable to get heel cord stretched enough to place correctly in  the AFO.  Education on trialing stretching today and having pt stretch at home as much as possible and to bring former shoes she wore with AFO along w/ brace to next visit to re-assess.  As of right now, pt is unable to safely wear any AFO due to PF contracture. -Attempted long-sitting and EOM sitting heel stretch w/ strap w/ pt pulling only into inversion so d/c'd and modified to seated EOM w/ pressure through knee to promote heel contact with ground, 4x30-45 seconds.  Added to HEP. -STS x8 w/ mirror feedback and PT facilitating left weight shift as pt has severe right bias -BERG:  [facility] PT Assessment - 02/18/23 1000       Standardized Balance Assessment   Standardized Balance Assessment Berg Balance Test      Berg Balance Test   Sit to Stand Able to stand using hands after several tries    Standing Unsupported Able to stand 2 minutes with supervision    Sitting with Back Unsupported but Feet Supported on Floor or Stool Able to sit safely and securely 2 minutes    Stand to Sit Uses backs of legs against chair to control descent    Transfers Able to transfer with verbal cueing and /or supervision    Standing Unsupported with Eyes Closed Able to stand 10 seconds with supervision    Standing Unsupported with Feet Together Needs help to attain position and unable to hold for 15 seconds    From Standing, Reach Forward with Outstretched Arm Reaches forward but needs supervision    From Standing Position, Pick up Object from Floor Unable to pick up and needs supervision    From Standing Position, Turn to Look Behind Over each Shoulder Looks behind one side only/other side shows less weight shift   right weight shift > left   Turn 360 Degrees Needs assistance while turning    Standing Unsupported, Alternately Place Feet on Step/Stool Needs assistance to keep from falling or unable to try    Standing Unsupported, One Foot in Front Needs help to step but can hold 15 seconds    Standing on One Leg  Unable to try or needs assist to prevent fall    Total Score 22    Berg comment: 22/56 = significant fall risk            -PT ambulates w/ pt SBA using LBQC to lobby x90' for safety due to caregiver hands being full.  PATIENT EDUCATION: Education details: Initial HEP, stressed slow and controlled lower to sitting for safety, bring AFO and wider tennis shoes to next visit.  BERG outcome interpretation and goal to be set. Person educated: Patient and aunt Education method: Explanation Education comprehension: verbalized understanding and needs further education  HOME EXERCISE PROGRAM: Access Code: [email or web address] URL: [email or web address] Date: 02/18/2023 Prepared by: Camille Bal  Exercises - Seated Ankle Dorsiflexion Stretch  - 1 x daily - 7 x weekly - 1 sets - 4 reps - 30-45 seconds hold - Sit to Stand with Armchair  - 1 x daily - 7 x weekly - 2 sets - 6-8 reps  GOALS: Goals reviewed with patient? Yes  SHORT TERM GOALS: Target date: 03/06/2023  Pt will be independent with strength and balance HEP with supervision from family. Baseline:  To be established. Goal status: INITIAL  2.  Pt will decrease 5xSTS to </=32.56 seconds in order to demonstrate decreased risk for falls and improved functional bilateral LE strength and power. Baseline: 37.56 seconds w/ RUE support from standard chair w/ all weight on RLE Goal status: INITIAL  3.  Pt will demonstrate TUG of </=33.66 seconds in order to decrease risk of falls and improve functional mobility using LRAD. Baseline: 43.66 sec w/ LBQC and CGA Goal status: INITIAL  LONG TERM GOALS: Target date: 03/27/2023  Pt will increase FOTO score to 63% in order to demonstrate subjective functional improvement. Baseline: 56% Goal status: INITIAL  2.  Pt will decrease 5xSTS to </=27.56 seconds in order to demonstrate decreased risk for falls and improved functional bilateral LE strength and power. Baseline: 37.56 seconds w/  RUE support from standard chair w/ all weight on RLE Goal status: INITIAL  3.  Pt will demonstrate TUG of </=23.66 seconds in order to decrease risk of falls and improve functional mobility using LRAD. Baseline: 43.66 sec w/ LBQC and CGA Goal status: INITIAL  4.  Pt will increase BERG balance score to >/=26/56 to demonstrate improved static balance. Baseline: 22/56 (5/22) Goal status: INITIAL  ASSESSMENT:  CLINICAL IMPRESSION: Pt remains limited in ability to safety use AFO due to PF contracture from lack of stretching, poor weight-bearing mechanics, and non-compliance to brace following last discharge to home setting.  She demonstrates little carryover for safety concerns in session including falling into sitting position and extreme right UE push into standing.  PT to continue addressing static balance and functional strength as able to promote improved safety in home and decreased caregiver burden as able.   OBJECTIVE IMPAIRMENTS: Abnormal gait, decreased activity tolerance, decreased balance, decreased coordination, decreased endurance, decreased knowledge of condition, decreased knowledge of use of DME, decreased mobility, difficulty walking, decreased ROM, decreased strength, decreased safety awareness, hypomobility, impaired flexibility, impaired UE functional use, improper body mechanics, and postural dysfunction.   ACTIVITY LIMITATIONS: carrying, lifting, bending, standing, stairs, transfers, bathing, toileting, dressing, reach over head, hygiene/grooming, locomotion level, and caring for others  PARTICIPATION LIMITATIONS: meal prep, cleaning, laundry, medication management, interpersonal relationship, driving, shopping, community activity, and occupation  PERSONAL FACTORS: Age, Behavior pattern, Fitness, Past/current experiences, Social background, Time since onset of injury/illness/exacerbation, Transportation, and 1-2 comorbidities: DM2, HTN  are also affecting patient's functional  outcome.   REHAB POTENTIAL: Fair See PMH and personal factors  CLINICAL DECISION MAKING: Evolving/moderate complexity  EVALUATION COMPLEXITY: Moderate  PLAN:  PT FREQUENCY: 1x/week  PT DURATION: 6 weeks  PLANNED INTERVENTIONS: Therapeutic exercises, Therapeutic activity, Neuromuscular re-education, Balance training, Gait training, Patient/Family education, Self Care, Joint mobilization, Stair training, Vestibular training, Orthotic/Fit training, DME instructions, and Re-evaluation  PLAN FOR NEXT SESSION:  Add to HEP prn for Left NMR/strength-maybe mat level like bridges?, gait training-did pt bring AFO and wider tennis shoe?  Heel stretch?   Sadie Haber, PT, DPT 02/18/2023, 10:17 AM

## 2023-02-25 ENCOUNTER — Ambulatory Visit: Payer: 59 | Admitting: Physical Therapy

## 2023-02-25 ENCOUNTER — Ambulatory Visit: Payer: 59

## 2023-02-25 DIAGNOSIS — R278 Other lack of coordination: Secondary | ICD-10-CM

## 2023-02-25 DIAGNOSIS — G8114 Spastic hemiplegia affecting left nondominant side: Secondary | ICD-10-CM

## 2023-02-25 DIAGNOSIS — I69354 Hemiplegia and hemiparesis following cerebral infarction affecting left non-dominant side: Secondary | ICD-10-CM

## 2023-02-25 DIAGNOSIS — R2681 Unsteadiness on feet: Secondary | ICD-10-CM

## 2023-02-25 DIAGNOSIS — R2689 Other abnormalities of gait and mobility: Secondary | ICD-10-CM

## 2023-02-25 DIAGNOSIS — M6281 Muscle weakness (generalized): Secondary | ICD-10-CM

## 2023-02-25 DIAGNOSIS — R41840 Attention and concentration deficit: Secondary | ICD-10-CM

## 2023-02-25 DIAGNOSIS — R208 Other disturbances of skin sensation: Secondary | ICD-10-CM

## 2023-02-25 NOTE — Therapy (Signed)
OUTPATIENT PHYSICAL THERAPY NEURO TREATMENT   Patient Name: Ebony Matthews MRN: [id number] DOB:09-Mar-1981, 42 y.o., female Today's Date: 02/25/2023   PCP: Ellyn Hack, MD REFERRING PROVIDER: Drema Dallas, DO  END OF SESSION:  PT End of Session - 02/25/23 0936     Visit Number 3    Number of Visits 7   6 + eval   Date for PT Re-Evaluation 04/03/23   pushed out due to multi-D scheduling needs   Authorization Type [employer]    PT Start Time [phone]    PT Stop Time 1015    PT Time Calculation (min) 40 min    Equipment Utilized During Treatment Gait belt    Activity Tolerance Patient tolerated treatment well    Behavior During Therapy [facility] for tasks assessed/performed              Past Medical History:  Diagnosis Date   Diabetes mellitus without complication (HCC)    Hyperlipidemia    Hypertension    Loop recorder Biotronic 12/14/2020 12/14/2020   Scheduled Remote loop recorder check 12/14/2020: NSR. New implant.    Stroke [facility])    TIA (transient ischemic attack) 01/22/2019   Past Surgical History:  Procedure Laterality Date   NO PAST SURGERIES     Patient Active Problem List   Diagnosis Date Noted   Pain due to onychomycosis of toenails of both feet 10/24/2021   Blood clotting disorder (HCC) 10/24/2021   Loop recorder Biotronic 12/14/2020 12/14/2020   Uncontrolled type 2 diabetes mellitus with hyperglycemia, without long-term current use of insulin (HCC) 04/16/2020   CVA (cerebral vascular accident) (HCC) 04/15/2020   Cocaine use 04/15/2020   Acute CVA (cerebrovascular accident) (HCC) 03/04/2020   Stroke (cerebrum) (HCC) 03/03/2020   Weakness    Noncompliance with medication regimen    TIA (transient ischemic attack) 01/22/2019   Diabetes mellitus due to underlying condition without complications (HCC) 05/30/2014   Essential hypertension, benign 05/30/2014   Smoking 05/30/2014    ONSET DATE: CVA in 2021  REFERRING DIAG: G81.90  (ICD-10-CM) - Hemiparesis, unspecified hemiparesis etiology, unspecified laterality (HCC) I63.9 (ICD-10-CM) - Cerebrovascular accident (CVA), unspecified mechanism (HCC)  THERAPY DIAG:  Hemiplegia and hemiparesis following cerebral infarction affecting left non-dominant side (HCC)  Muscle weakness (generalized)  Unsteadiness on feet  Other abnormalities of gait and mobility  Rationale for Evaluation and Treatment: Rehabilitation  SUBJECTIVE:  SUBJECTIVE STATEMENT: Pt reports she is having muscle tightness in both legs, no falls. Pt reports new onset of difficulty rolling over in bed and getting up out of the bed a few nights ago but didn't have trouble with this last night.   Pt accompanied by: family member- Aunt, [name]  PERTINENT HISTORY: DM2, HTN, tobacco use, TIA, CVA, cocaine use  PAIN:  Are you having pain? No  PRECAUTIONS: Fall and Other: loop recorder in place, no pacemaker/ICD  WEIGHT BEARING RESTRICTIONS: No  OBJECTIVE:   DIAGNOSTIC FINDINGS:  MRI of Brain 04/15/2020 IMPRESSION: Multiple new acute infarcts in the right cerebral hemisphere. Small acute infarct of the right pons.   Expected evolution of infarcts on the prior study.  COGNITION: Overall cognitive status: Within functional limits for tasks assessed   SENSATION: Light touch: WFL  COORDINATION: Unable to assess due to functional LLE weakness.  EDEMA:  None noted in BLE during eval.  MUSCLE TONE: No hypertonicity noted in LLE w/ passive eval; no clonus.  POSTURE: rounded shoulders, forward head, and weight shift right  LOWER EXTREMITY ROM/MMT:    PASSIVE was WNL on LLE except DF, pt contracted into PF 9 degrees. Active  Right Eval Left Eval  Hip flexion 5/5 2+/5  Hip extension    Hip abduction 5/5 3/5   Hip adduction 5/5 3+/5  Hip internal rotation    Hip external rotation    Knee flexion 5/5 2/5  Knee extension 5/5 Unable to adequately assess as pt compensates by flinging leg forward w/ hip flexion/hike  Ankle dorsiflexion 5/5 0/5  Ankle plantarflexion    Ankle inversion    Ankle eversion     (Blank rows = not tested)  BED MOBILITY:  Sit to supine Complete Independence Supine to sit Complete Independence Rolling to Right Complete Independence Rolling to Left Complete Independence  TRANSFERS: Assistive device utilized: Quad cane large base  Sit to stand: SBA Stand to sit: Modified independence Chair to chair: SBA  GAIT: Gait pattern: step to pattern, decreased arm swing- Left, decreased step length- Right, decreased stance time- Left, decreased stride length, decreased ankle dorsiflexion- Left, circumduction- Left, Left hip hike, genu recurvatum- Left, lateral lean- Right, wide BOS, abducted- Left, and poor foot clearance- Left Distance walked: various clinic distances Assistive device utilized: Quad cane large base Level of assistance: SBA and CGA  FUNCTIONAL TESTS:  5 times sit to stand: 37.56 seconds w/ RUE support from standard chair w/ all weight on RLE, LLE remains kicked forward and abducted during all STS Timed up and go (TUG): 43.66 sec w/ LBQC and CGA Berg Balance Scale: To be assessed.  PATIENT SURVEYS:  FOTO 56%  VITALS (RUE in sitting at end of session): There were no vitals filed for this visit.  TODAY'S TREATMENT:  [employer] Assisted pt with donning her custom L AFO and tennis shoes, total A with use of shoe horn. Pt unable to don her AFO independently and continues to exhibit L ankle PF contracture, heel does not fully rest in brace. Provided handout for PF night splint from [employer] to assist with PF contracture. Pt's aunt to look into  getting this splint and will bring to future therapy sessions for setup.  NMR Sit to stands x 5 reps from mat table to [facility] with focus on equal WB through BLE with sit to stand and stand to sit as pt tends to only WB on R side with transfer.  Gait   Gait pattern: decreased hip/knee flexion- Left, decreased ankle dorsiflexion- Left, circumduction- Left, and lateral lean- Right Distance walked: various clinic distances Assistive device utilized: Quad cane large base Level of assistance: CGA Comments: severe L hip circumduction  Gait pattern: decreased hip/knee flexion- Left, decreased ankle dorsiflexion- Left, circumduction- Left, lateral lean- Right, and trunk rotated posterior- Left Distance walked: 115 ft Assistive device utilized: Quad cane large base Level of assistance: SBA Comments: decreased L hip circumduction with use of L AFO (custom hinged brace), does have some L knee genu recurvatum; pelvic and trunk rotation with L side posterior and R side anterior    PATIENT EDUCATION: Education details: continue with L ankle stretches and work on obtaining PF brace Person educated: Patient and aunt Education method: Explanation and Handouts Education comprehension: verbalized understanding and needs further education  HOME EXERCISE PROGRAM: Access Code: [email or web address] URL: [email or web address] Date: 02/18/2023 Prepared by: Camille Bal  Exercises - Seated Ankle Dorsiflexion Stretch  - 1 x daily - 7 x weekly - 1 sets - 4 reps - 30-45 seconds hold - Sit to Stand with Armchair  - 1 x daily - 7 x weekly - 2 sets - 6-8 reps  GOALS: Goals reviewed with patient? Yes  SHORT TERM GOALS: Target date: 03/06/2023  Pt will be independent with strength and balance HEP with supervision from family. Baseline:  To be established. Goal status: INITIAL  2.  Pt will decrease 5xSTS to </=32.56 seconds in order to demonstrate decreased risk for falls and improved functional  bilateral LE strength and power. Baseline: 37.56 seconds w/ RUE support from standard chair w/ all weight on RLE Goal status: INITIAL  3.  Pt will demonstrate TUG of </=33.66 seconds in order to decrease risk of falls and improve functional mobility using LRAD. Baseline: 43.66 sec w/ LBQC and CGA Goal status: INITIAL  LONG TERM GOALS: Target date: 03/27/2023  Pt will increase FOTO score to 63% in order to demonstrate subjective functional improvement. Baseline: 56% Goal status: INITIAL  2.  Pt will decrease 5xSTS to </=27.56 seconds in order to demonstrate decreased risk for falls and improved functional bilateral LE strength and power. Baseline: 37.56 seconds w/ RUE support from standard chair w/ all weight on RLE Goal status: INITIAL  3.  Pt will demonstrate TUG of </=23.66 seconds in order to decrease risk of falls and improve functional mobility using LRAD. Baseline: 43.66 sec w/ LBQC and CGA Goal status: INITIAL  4.  Pt will increase BERG balance score to >/=26/56 to demonstrate improved static balance. Baseline: 22/56 (5/22) Goal status: INITIAL  ASSESSMENT:  CLINICAL IMPRESSION: Emphasis of skilled PT session on assessing gait with and without custom L AFO, reviewing current HEP, discussing importance of performing L ankle stretches, and discussing purpose of L PF splint/brace. Pt is dependent to don her L AFO  and her LE does not full rest in brace due to her PF contracture, however she does exhibit improved gait with use of her AFO vs gait with no AFO. Pt also exhibits significant gait impairments as noted above and continues to benefit from skilled therapy services to improve her safety and independence with functional mobility. Pt continues to benefit from skilled therapy services to address L hemibody weakness as well. Continue POC.    OBJECTIVE IMPAIRMENTS: Abnormal gait, decreased activity tolerance, decreased balance, decreased coordination, decreased endurance,  decreased knowledge of condition, decreased knowledge of use of DME, decreased mobility, difficulty walking, decreased ROM, decreased strength, decreased safety awareness, hypomobility, impaired flexibility, impaired UE functional use, improper body mechanics, and postural dysfunction.   ACTIVITY LIMITATIONS: carrying, lifting, bending, standing, stairs, transfers, bathing, toileting, dressing, reach over head, hygiene/grooming, locomotion level, and caring for others  PARTICIPATION LIMITATIONS: meal prep, cleaning, laundry, medication management, interpersonal relationship, driving, shopping, community activity, and occupation  PERSONAL FACTORS: Age, Behavior pattern, Fitness, Past/current experiences, Social background, Time since onset of injury/illness/exacerbation, Transportation, and 1-2 comorbidities: DM2, HTN  are also affecting patient's functional outcome.   REHAB POTENTIAL: Fair See PMH and personal factors  CLINICAL DECISION MAKING: Evolving/moderate complexity  EVALUATION COMPLEXITY: Moderate  PLAN:  PT FREQUENCY: 1x/week  PT DURATION: 6 weeks  PLANNED INTERVENTIONS: Therapeutic exercises, Therapeutic activity, Neuromuscular re-education, Balance training, Gait training, Patient/Family education, Self Care, Joint mobilization, Stair training, Vestibular training, Orthotic/Fit training, DME instructions, and Re-evaluation  PLAN FOR NEXT SESSION:  Add to HEP prn for Left NMR/strength-maybe mat level like bridges?, gait training-did pt bring AFO and wider tennis shoe?  Heel stretch?, staggered sit to stand, weight shift to the L   Peter Congo, PT, DPT, CSRS 02/25/2023, 10:15 AM

## 2023-02-25 NOTE — Therapy (Signed)
OUTPATIENT OCCUPATIONAL THERAPY NEURO TREATMENT  Patient Name: Ebony Matthews MRN: [id number] DOB:1981-07-19, 42 y.o., female Today's Date: 02/25/2023  PCP: Ellyn Hack, MD REFERRING PROVIDER: Drema Dallas, DO  END OF SESSION:  OT End of Session - 02/25/23 0855     Visit Number 3    Number of Visits 6    Authorization Type [employer] Medicare primary    OT Start Time 0855   arrived late   OT Stop Time 0928    OT Time Calculation (min) 33 min    Activity Tolerance Patient tolerated treatment well    Behavior During Therapy [facility] for tasks assessed/performed             Past Medical History:  Diagnosis Date   Diabetes mellitus without complication (HCC)    Hyperlipidemia    Hypertension    Loop recorder Biotronic 12/14/2020 12/14/2020   Scheduled Remote loop recorder check 12/14/2020: NSR. New implant.    Stroke [facility])    TIA (transient ischemic attack) 01/22/2019   Past Surgical History:  Procedure Laterality Date   NO PAST SURGERIES     Patient Active Problem List   Diagnosis Date Noted   Pain due to onychomycosis of toenails of both feet 10/24/2021   Blood clotting disorder (HCC) 10/24/2021   Loop recorder Biotronic 12/14/2020 12/14/2020   Uncontrolled type 2 diabetes mellitus with hyperglycemia, without long-term current use of insulin (HCC) 04/16/2020   CVA (cerebral vascular accident) (HCC) 04/15/2020   Cocaine use 04/15/2020   Acute CVA (cerebrovascular accident) (HCC) 03/04/2020   Stroke (cerebrum) (HCC) 03/03/2020   Weakness    Noncompliance with medication regimen    TIA (transient ischemic attack) 01/22/2019   Diabetes mellitus due to underlying condition without complications (HCC) 05/30/2014   Essential hypertension, benign 05/30/2014   Smoking 05/30/2014    ONSET DATE: 01/14/2023 (referral date)  REFERRING DIAG: G81.90 (ICD-10-CM) - Hemiparesis, unspecified hemiparesis etiology, unspecified laterality (HCC) I63.9 (ICD-10-CM) - Cerebrovascular  accident (CVA), unspecified mechanism (HCC)  THERAPY DIAG:  Hemiplegia and hemiparesis following cerebral infarction affecting left non-dominant side (HCC)  Muscle weakness (generalized)  Unsteadiness on feet  Other lack of coordination  Other disturbances of skin sensation  Attention and concentration deficit  Spastic hemiplegia affecting left nondominant side, unspecified etiology (HCC)  Rationale for Evaluation and Treatment: Rehabilitation  SUBJECTIVE:   SUBJECTIVE STATEMENT: Pt reports going by [name]. Pt reports her muscles feel more tight today.   Pt accompanied by: self and family member - aunt [name]  PERTINENT HISTORY: History of multiple strokes with residual deficits, right ICA occlusion, hypertension, diabetes, history of cocaine use, former smoker  PRECAUTIONS: Other: Loop implant to monitor for atrial fibrillation  WEIGHT BEARING RESTRICTIONS: No  PAIN:  Are you having pain? No  FALLS: Has patient fallen in last 6 months? No  LIVING ENVIRONMENT: Lives with: lives alone Lives in: House/apartment - 1st floor apartment Stairs: No Has following equipment at home: Quad cane large base, Hemi walker, Shower bench, bed side commode, and Grab bars Bathroom setup: Tub/shower combo with grab bars, standard height toilet with bedside commode frame over top that has arm rests  PLOF:  Pt varies between independence and needing assistance pending on the task, likes to go get nails done, aunt drives patient  PATIENT GOALS: Use LUE better to do daily tasks such as washing dishes, bathing, and cooking  OBJECTIVE:   HAND DOMINANCE: Right  ADLs: Eating: Independent Grooming: Independent brushing teeth and washing face, aunt  helps with putting on deodorant UB Dressing: Pt can get a "big shirt on and off", assist with bra and jacket LB Dressing: Independent Toileting: Independent Bathing: Independent LB, assist with UB Tub Shower transfers: Utilizes tub bench, sits  down to shower Equipment: Transfer tub bench, Grab bars, bed side commode, and Reacher  IADLs: Shopping: Aunt performs grocery shopping, leisurely walks through [place], but not for shopping specifically Light housekeeping: Puts away dishes, aunt does more heavy household chores Meal Prep: Aunt makes her meals Community mobility: Utilizes quad cane at all times to include household mobility Medication management: Easy-open pill box Financial management: Independent Handwriting: 100% legible and Mild micrographia - pt reports baseline  MOBILITY STATUS: Independent  POSTURE COMMENTS:  No Significant postural limitations Sitting balance: Supports self with one extremity  ACTIVITY TOLERANCE: Activity tolerance: Pt reports no limitations with distance from cardiovascular standpoint, but muscles get fatigued  FUNCTIONAL OUTCOME MEASURES: FOTO: 33    UPPER EXTREMITY ROM:    RUE - AROM WNL LUE - PROM WFL, pt attempts to perform active shoulder flexion achieving approx 10-15 degrees (PROM up to 90*), no active elbow, wrist, or digit ROM  UPPER EXTREMITY MMT:     RUE - MMT WNL LUE - shoulder 2-/5, elbow 0/5, wrist 0/5, digits 0/5  HAND FUNCTION: Pt unable to move left hand at all  COORDINATION: RUE - no deficits; LUE - unable to test due to inability to actively move extremity  SENSATION: Light touch: Impaired - dull sensation LUE when compared to RUE  EDEMA: None  MUSCLE TONE: RUE: Within functional limits and LUE: Modifed Ashworth Scale 1+ = Slight increase in muscle tone, manifested by a catch, followed by minimal resistance throughout the remainder (less than half) of the ROM  COGNITION: Overall cognitive status:  Pt reports some difficulty with recall  VISION: Subjective report: No changes from CVA Baseline vision: Wears glasses all the time Visual history:  None  VISION ASSESSMENT: WFL  Patient has difficulty with following activities due to following visual  impairments: N/A  PERCEPTION: WFL  PRAXIS: WFL  OBSERVATIONS: Pt with difficulty transferring from chair in lobby to standing with quad cane. Pt ambulating back to treatment clinic utilizing quad cane in RUE with minimal to no natural arm swing of LUE, compensatory walking pattern with left hip hike with swing, slower gait. Aunt is present with patient carrying other times.  02/25/23: Similar presentation   TODAY'S TREATMENT:                                                                                                                              DATE:  Neuromuscular re-education - Pt demonstrates exercises she does typically, which is PROM LUE elbow flexion and wrist flexion; however, unable to demo any of the exercises from last visit HEP. Encouraged to perform to ensure no pain with activity in prep for next visit. OT completing HEP this visit and demo'd seated shoulder abduction  towel slide to finish full HEP. Pt performed x 10 reps x 1 set with intermittent verbal and tactile cues for correct form. OT providing prolonged stretch left-side of flexion end of D1, extension end of D1, flexion end of D2, extension end of D2, scalene stretch, upper chest stretch due to pt report of muscle tightness all x 10 reps each. OT then directing attention to left hand providing PROM to digit and thumb extension, then individual digits PIP/DIP gentle extension. Pt reports "occasionally" stretching hand. Encouraged to perform daily. Pt able to return demo of self-ROM.   PATIENT EDUCATION: Education details: Review of LUE self-ROM HEP, hand self-ROM Person educated: Patient and aunt Education method: Explanation, Demonstration, Tactile cues, and Verbal cues Education comprehension: verbalized understanding, verbal cues required, and tactile cues required  HOME EXERCISE PROGRAM: 02/11/23 - LUE stretch HEP  Access Code: [email or web address] URL: [email or web address] Date: 02/18/2023 Prepared by:  Wyn Forster Lyndee Herbst 02/25/23: None   GOALS: Goals reviewed with patient? Yes  SHORT TERM GOALS: Target date: 03/04/2023   Pt will be independent in LUE HEP. Baseline: Initiated Goal status: IN PROGRESS  2.  Pt will verbalize at least 2 adaptive equipment for completing ADLs. Baseline:  Goal status: IN PROGRESS  3. Pt will verbalize at least 2 memory compensation strategies. Baseline: Reports intermittent recall impairments Goal status: IN PROGRESS   LONG TERM GOALS: Target date: 03/25/2023   Pt will complete FOTO assessment at time of discharge scoring 39 or greater indicating functional progression with ADL and IADL completion. Baseline: score 33 Goal status: IN PROGRESS  2.  Pt will verbalize at least 3 compensatory methods for completing IADLs such as dish washing. Baseline:  Goal status: IN PROGRESS  3.  Pt will report completing UB bathing at minimal assistance level. Baseline: Mod assist Goal status: IN PROGRESS  4.  Pt will complete one simple meal with Modified independence utilizing DME/AE as needed. Baseline:  Goal status: IN PROGRESS  5.  Pt will verbalize resource to obtain fabricated splint if needed. Baseline:  Goal status: IN PROGRESS   ASSESSMENT:  CLINICAL IMPRESSION: Pt with similar presentation as compared to previous visits. Pt not showing progress as of yet. Education provided for effects of chronic stroke, and encouraged to perform self-ROM as much as possible. Education also provided to discern between now and next visit specific tasks that pt would like to address to improve independence. Pt would continue to benefit from skilled OT services to address performance deficits as listed below, and to maximize independence in activities of daily living.  PERFORMANCE DEFICITS: in functional skills including ADLs, IADLs, coordination, dexterity, sensation, tone, ROM, strength, flexibility, Fine motor control, Gross motor control, mobility, balance, body  mechanics, endurance, and UE functional use, cognitive skills including energy/drive and memory, and psychosocial skills including coping strategies and habits.   IMPAIRMENTS: are limiting patient from ADLs, IADLs, work, leisure, and social participation.   CO-MORBIDITIES: may have co-morbidities  that affects occupational performance. Patient will benefit from skilled OT to address above impairments and improve overall function.  MODIFICATION OR ASSISTANCE TO COMPLETE EVALUATION: No modification of tasks or assist necessary to complete an evaluation.  OT OCCUPATIONAL PROFILE AND HISTORY: Detailed assessment: Review of records and additional review of physical, cognitive, psychosocial history related to current functional performance.  CLINICAL DECISION MAKING: LOW - limited treatment options, no task modification necessary  REHAB POTENTIAL: Fair due to onset of CVA being 2021  EVALUATION COMPLEXITY: Low    PLAN:  OT FREQUENCY: 1x/week  OT DURATION: 6 weeks  PLANNED INTERVENTIONS: self care/ADL training, therapeutic exercise, therapeutic activity, neuromuscular re-education, manual therapy, passive range of motion, balance training, functional mobility training, splinting, electrical stimulation, paraffin, fluidotherapy, moist heat, cryotherapy, contrast bath, patient/family education, cognitive remediation/compensation, visual/perceptual remediation/compensation, psychosocial skills training, energy conservation, coping strategies training, DME and/or AE instructions, and Re-evaluation  RECOMMENDED OTHER SERVICES: Currently has PT eval  CONSULTED AND AGREED WITH PLAN OF CARE: Patient and family member/caregiver  PLAN FOR NEXT SESSION: Review of HEP, would like to add PROM pronation and supination and digit extension, one-handed techniques, assess how pt performs bathing and IADLs, provided ADL and IADL compensatory methods   Chares Slaymaker M Nolin Grell, OT 02/25/2023, 9:28 AM

## 2023-03-04 ENCOUNTER — Ambulatory Visit: Payer: 59

## 2023-03-04 ENCOUNTER — Ambulatory Visit: Payer: 59 | Admitting: Physical Therapy

## 2023-03-11 ENCOUNTER — Ambulatory Visit: Payer: 59 | Admitting: Occupational Therapy

## 2023-03-11 ENCOUNTER — Ambulatory Visit: Payer: 59 | Attending: Neurology | Admitting: Physical Therapy

## 2023-03-11 ENCOUNTER — Encounter: Payer: Self-pay | Admitting: Physical Therapy

## 2023-03-11 DIAGNOSIS — R278 Other lack of coordination: Secondary | ICD-10-CM | POA: Diagnosis present

## 2023-03-11 DIAGNOSIS — M6281 Muscle weakness (generalized): Secondary | ICD-10-CM | POA: Insufficient documentation

## 2023-03-11 DIAGNOSIS — R2681 Unsteadiness on feet: Secondary | ICD-10-CM | POA: Diagnosis present

## 2023-03-11 DIAGNOSIS — M24542 Contracture, left hand: Secondary | ICD-10-CM | POA: Insufficient documentation

## 2023-03-11 DIAGNOSIS — I69354 Hemiplegia and hemiparesis following cerebral infarction affecting left non-dominant side: Secondary | ICD-10-CM

## 2023-03-11 DIAGNOSIS — R2689 Other abnormalities of gait and mobility: Secondary | ICD-10-CM | POA: Diagnosis present

## 2023-03-11 NOTE — Therapy (Signed)
OUTPATIENT PHYSICAL THERAPY NEURO TREATMENT   Patient Name: Ebony Matthews MRN: [id number] DOB:1981/01/11, 42 y.o., female Today's Date: 03/11/2023   PCP: Ellyn Hack, MD REFERRING PROVIDER: Drema Dallas, DO  END OF SESSION:  PT End of Session - 03/11/23 0936     Visit Number 4    Number of Visits 7   6 + eval   Date for PT Re-Evaluation 04/03/23   pushed out due to multi-D scheduling needs   Authorization Type [employer] MEDICARE    PT Start Time [phone]   handoff from OT   PT Stop Time 1020    PT Time Calculation (min) 46 min    Equipment Utilized During Treatment Gait belt    Activity Tolerance Patient tolerated treatment well    Behavior During Therapy Flat affect              Past Medical History:  Diagnosis Date   Diabetes mellitus without complication (HCC)    Hyperlipidemia    Hypertension    Loop recorder Biotronic 12/14/2020 12/14/2020   Scheduled Remote loop recorder check 12/14/2020: NSR. New implant.    Stroke [facility])    TIA (transient ischemic attack) 01/22/2019   Past Surgical History:  Procedure Laterality Date   NO PAST SURGERIES     Patient Active Problem List   Diagnosis Date Noted   Pain due to onychomycosis of toenails of both feet 10/24/2021   Blood clotting disorder (HCC) 10/24/2021   Loop recorder Biotronic 12/14/2020 12/14/2020   Uncontrolled type 2 diabetes mellitus with hyperglycemia, without long-term current use of insulin (HCC) 04/16/2020   CVA (cerebral vascular accident) (HCC) 04/15/2020   Cocaine use 04/15/2020   Acute CVA (cerebrovascular accident) (HCC) 03/04/2020   Stroke (cerebrum) (HCC) 03/03/2020   Weakness    Noncompliance with medication regimen    TIA (transient ischemic attack) 01/22/2019   Diabetes mellitus due to underlying condition without complications (HCC) 05/30/2014   Essential hypertension, benign 05/30/2014   Smoking 05/30/2014    ONSET DATE: CVA in 2021  REFERRING DIAG: G81.90 (ICD-10-CM)  - Hemiparesis, unspecified hemiparesis etiology, unspecified laterality (HCC) I63.9 (ICD-10-CM) - Cerebrovascular accident (CVA), unspecified mechanism (HCC)  THERAPY DIAG:  Hemiplegia and hemiparesis following cerebral infarction affecting left non-dominant side (HCC)  Muscle weakness (generalized)  Unsteadiness on feet  Other lack of coordination  Rationale for Evaluation and Treatment: Rehabilitation  SUBJECTIVE:  SUBJECTIVE STATEMENT: Pt reports she is having right leg stiffness and tightness.  Denies falls or acute changes.  She is received from OT requiring minA to stand and ambulates with incorrect LBQC (left use only) as she states she forgot to grab the correct one at home.   Pt accompanied by: family member- Aunt, [name]  PERTINENT HISTORY: DM2, HTN, tobacco use, TIA, CVA, cocaine use  PAIN:  Are you having pain? No  PRECAUTIONS: Fall and Other: loop recorder in place, no pacemaker/ICD  WEIGHT BEARING RESTRICTIONS: No  OBJECTIVE:   DIAGNOSTIC FINDINGS:  MRI of Brain 04/15/2020 IMPRESSION: Multiple new acute infarcts in the right cerebral hemisphere. Small acute infarct of the right pons.   Expected evolution of infarcts on the prior study.  COGNITION: Overall cognitive status: Within functional limits for tasks assessed   SENSATION: Light touch: WFL  COORDINATION: Unable to assess due to functional LLE weakness.  EDEMA:  None noted in BLE during eval.  MUSCLE TONE: No hypertonicity noted in LLE w/ passive eval; no clonus.  POSTURE: rounded shoulders, forward head, and weight shift right  LOWER EXTREMITY ROM/MMT:    PASSIVE was WNL on LLE except DF, pt contracted into PF 9 degrees. Active  Right Eval Left Eval  Hip flexion 5/5 2+/5  Hip extension    Hip abduction  5/5 3/5  Hip adduction 5/5 3+/5  Hip internal rotation    Hip external rotation    Knee flexion 5/5 2/5  Knee extension 5/5 Unable to adequately assess as pt compensates by flinging leg forward w/ hip flexion/hike  Ankle dorsiflexion 5/5 0/5  Ankle plantarflexion    Ankle inversion    Ankle eversion     (Blank rows = not tested)  BED MOBILITY:  Sit to supine Complete Independence Supine to sit Complete Independence Rolling to Right Complete Independence Rolling to Left Complete Independence  TRANSFERS: Assistive device utilized: Quad cane large base  Sit to stand: SBA Stand to sit: Modified independence Chair to chair: SBA  GAIT: Gait pattern: step to pattern, decreased arm swing- Left, decreased step length- Right, decreased stance time- Left, decreased stride length, decreased ankle dorsiflexion- Left, circumduction- Left, Left hip hike, genu recurvatum- Left, lateral lean- Right, wide BOS, abducted- Left, and poor foot clearance- Left Distance walked: various clinic distances Assistive device utilized: Quad cane large base Level of assistance: SBA and CGA  FUNCTIONAL TESTS:  5 times sit to stand: 37.56 seconds w/ RUE support from standard chair w/ all weight on RLE, LLE remains kicked forward and abducted during all STS Timed up and go (TUG): 43.66 sec w/ LBQC and CGA Berg Balance Scale: To be assessed.  PATIENT SURVEYS:  FOTO 56%  TODAY'S TREATMENT:                                                                                                                               TherAct -Time spent adjusting night splint that pt  and aunt bought and brought in.  Demonstrated setup x2. Reviewed HEP: -Heel stretch: time spent demonstrating and emphasizing direction of pull and varying ways to do this stretch in sitting vs long sit (pt would need posterior support like head board) -STS x3; facilitation of knee bend to prevent plopping -Forward scoot ankle stretch x1 minute w/  manual knee pressure to improve heel contact, pt able to sit unsupported to apply pressure -Staggered STS LLE in rear x1; several attempts required w/ edu on anterior weight shift and modA to achieve standing w/o moving RLE to rear; not added to HEP due to this -Pt transitions to supine minA for LLE management and pillow adjustment/trunk correction for symmetry - supine bridges 2x10; on return to EOM pt leaves left shoulder in IR an extension without correction in sitting, edu on safety and protection of shoulder with PT adjusting for patient. -Pt self initiates correction of symmetric posture on EOM before standing, but cannot maintain during active stand resulting in RLE use only due to severe weight shift.  PT ambulates with pt to lobby due to improper cane and severe LLE circumduction for safety.  PATIENT EDUCATION: Education details: Encouraged HEP compliance.  Discussed pt needing to be more independent with management of the LLE and left shoulder protection during transfers, donning shoes, adjusting to stand, etc. as pt demonstrates some self-limiting behavior during session today.  Aunt reports she does all of this at home and pt is able to return demo on LLE management in adjustment to prepare for stand as well as into supported long-sit on mat table.  Please attempt L ankle stretches (pt has not done HEP yet) and start by wearing night splint for 1 hour and assess skin intergrity/irritation then progress by adding an hour or two each wear after as tolerated until can wear overnight.  Please bring correct (right [facility]) cane to session for safety. Person educated: Patient and aunt Education method: Explanation and Handouts Education comprehension: verbalized understanding and needs further education  HOME EXERCISE PROGRAM: Access Code: [email or web address] URL: [email or web address] Date: 02/18/2023 Prepared by: Camille Bal  Exercises - Seated Ankle Dorsiflexion Stretch  - 1 x daily  - 7 x weekly - 1 sets - 4 reps - 30-45 seconds hold - Sit to Stand with Armchair  - 1 x daily - 7 x weekly - 2 sets - 6-8 reps  GOALS: Goals reviewed with patient? Yes  SHORT TERM GOALS: Target date: 03/06/2023  Pt will be independent with strength and balance HEP with supervision from family. Baseline:  Updated, pt not compliant (6/12) Goal status: IN PROGRESS  2.  Pt will decrease 5xSTS to </=32.56 seconds in order to demonstrate decreased risk for falls and improved functional bilateral LE strength and power. Baseline: 37.56 seconds w/ RUE support from standard chair w/ all weight on RLE Goal status: INITIAL  3.  Pt will demonstrate TUG of </=33.66 seconds in order to decrease risk of falls and improve functional mobility using LRAD. Baseline: 43.66 sec w/ LBQC and CGA Goal status: INITIAL  LONG TERM GOALS: Target date: 03/27/2023  Pt will increase FOTO score to 63% in order to demonstrate subjective functional improvement. Baseline: 56% Goal status: INITIAL  2.  Pt will decrease 5xSTS to </=27.56 seconds in order to demonstrate decreased risk for falls and improved functional bilateral LE strength and power. Baseline: 37.56 seconds w/ RUE support from standard chair w/ all weight on RLE Goal status: INITIAL  3.  Pt will demonstrate TUG of </=  23.66 seconds in order to decrease risk of falls and improve functional mobility using LRAD. Baseline: 43.66 sec w/ LBQC and CGA Goal status: INITIAL  4.  Pt will increase BERG balance score to >/=26/56 to demonstrate improved static balance. Baseline: 22/56 (5/22) Goal status: INITIAL  ASSESSMENT:  CLINICAL IMPRESSION: Initiated STG assessment this session by reviewing and modifying HEP.  Pt continues to perform STS incorrectly relying solely on RLE for completion of stand.  Excess time spent correcting this today as well as attempting staggered variation to bias LLE without success.  Added a variation of heel stretch for ease and  independence of performance as pt need head board support to perform long-sitting version.  Time spent setting up and teaching set-up of night splint to patient and aunt.  Will continue per POC.  OBJECTIVE IMPAIRMENTS: Abnormal gait, decreased activity tolerance, decreased balance, decreased coordination, decreased endurance, decreased knowledge of condition, decreased knowledge of use of DME, decreased mobility, difficulty walking, decreased ROM, decreased strength, decreased safety awareness, hypomobility, impaired flexibility, impaired UE functional use, improper body mechanics, and postural dysfunction.   ACTIVITY LIMITATIONS: carrying, lifting, bending, standing, stairs, transfers, bathing, toileting, dressing, reach over head, hygiene/grooming, locomotion level, and caring for others  PARTICIPATION LIMITATIONS: meal prep, cleaning, laundry, medication management, interpersonal relationship, driving, shopping, community activity, and occupation  PERSONAL FACTORS: Age, Behavior pattern, Fitness, Past/current experiences, Social background, Time since onset of injury/illness/exacerbation, Transportation, and 1-2 comorbidities: DM2, HTN  are also affecting patient's functional outcome.   REHAB POTENTIAL: Fair See PMH and personal factors  CLINICAL DECISION MAKING: Evolving/moderate complexity  EVALUATION COMPLEXITY: Moderate  PLAN:  PT FREQUENCY: 1x/week  PT DURATION: 6 weeks  PLANNED INTERVENTIONS: Therapeutic exercises, Therapeutic activity, Neuromuscular re-education, Balance training, Gait training, Patient/Family education, Self Care, Joint mobilization, Stair training, Vestibular training, Orthotic/Fit training, DME instructions, and Re-evaluation  PLAN FOR NEXT SESSION:  Add to HEP prn for Left NMR/strength-maybe mat level like bridges?, gait training-did pt bring AFO and wider tennis shoe?  Heel stretch?, staggered sit to stand, weight shift to the L, FINISH STGs!   Sadie Haber, PT, DPT 03/11/2023, 10:35 AM

## 2023-03-11 NOTE — Patient Instructions (Signed)
L UE PROM/AAROM  Access Code: [email or web address] URL: [email or web address] Date: 03/11/2023 Prepared by: Amada Kingfisher  Exercises - Seated Shoulder Shrugs  - 5 x daily - 5-10 reps - Seated Wrist Supination PROM  - 1 x daily - 2-3 sets - 10 reps - Supported Elbow Flexion Extension PROM  - 1 x daily - 2 sets - 10 reps - Finger MP Flexion Extension  - 1 x daily - 2 sets - 10 reps - Seated Elbow Flexion Shoulder Internal Rotation AAROM at Table with Towel  - 1 x daily - 2 sets - 10 reps

## 2023-03-11 NOTE — Therapy (Signed)
OUTPATIENT OCCUPATIONAL THERAPY NEURO TREATMENT  Patient Name: Ebony Matthews MRN: [id number] DOB:1981/07/09, 42 y.o., female Today's Date: 03/11/2023  PCP: Ellyn Hack, MD REFERRING PROVIDER: Drema Dallas, DO  END OF SESSION:  OT End of Session - 03/11/23 0857     Visit Number 4    Number of Visits 6    Authorization Type [employer] Medicare primary    OT Start Time [phone]    OT Stop Time 0932    OT Time Calculation (min) 40 min    Activity Tolerance Patient tolerated treatment well    Behavior During Therapy [facility] for tasks assessed/performed             Past Medical History:  Diagnosis Date   Diabetes mellitus without complication (HCC)    Hyperlipidemia    Hypertension    Loop recorder Biotronic 12/14/2020 12/14/2020   Scheduled Remote loop recorder check 12/14/2020: NSR. New implant.    Stroke [facility])    TIA (transient ischemic attack) 01/22/2019   Past Surgical History:  Procedure Laterality Date   NO PAST SURGERIES     Patient Active Problem List   Diagnosis Date Noted   Pain due to onychomycosis of toenails of both feet 10/24/2021   Blood clotting disorder (HCC) 10/24/2021   Loop recorder Biotronic 12/14/2020 12/14/2020   Uncontrolled type 2 diabetes mellitus with hyperglycemia, without long-term current use of insulin (HCC) 04/16/2020   CVA (cerebral vascular accident) (HCC) 04/15/2020   Cocaine use 04/15/2020   Acute CVA (cerebrovascular accident) (HCC) 03/04/2020   Stroke (cerebrum) (HCC) 03/03/2020   Weakness    Noncompliance with medication regimen    TIA (transient ischemic attack) 01/22/2019   Diabetes mellitus due to underlying condition without complications (HCC) 05/30/2014   Essential hypertension, benign 05/30/2014   Smoking 05/30/2014    ONSET DATE: 01/14/2023 (referral date)  REFERRING DIAG: G81.90 (ICD-10-CM) - Hemiparesis, unspecified hemiparesis etiology, unspecified laterality (HCC) I63.9 (ICD-10-CM) - Cerebrovascular accident (CVA),  unspecified mechanism (HCC)  THERAPY DIAG:  Hemiplegia and hemiparesis following cerebral infarction affecting left non-dominant side (HCC)  Muscle weakness (generalized)  Other lack of coordination  Rationale for Evaluation and Treatment: Rehabilitation  SUBJECTIVE:   SUBJECTIVE STATEMENT: Pt reports going by Ebony Matthews. Pt reports her muscles still feel stiff.  When asked about ADLs, patient reports that she gets help from her aunt for some aspects of ADLs.   Pt accompanied by: self and family member - aunt Ebony Matthews  PERTINENT HISTORY: History of multiple strokes with residual deficits, right ICA occlusion, hypertension, diabetes, history of cocaine use, former smoker  PRECAUTIONS: Other: Loop implant to monitor for atrial fibrillation  WEIGHT BEARING RESTRICTIONS: No  PAIN:  Are you having pain? No  FALLS: Has patient fallen in last 6 months? No  LIVING ENVIRONMENT: Lives with: lives alone Lives in: House/apartment - 1st floor apartment Stairs: No Has following equipment at home: Quad cane large base, Hemi walker, Shower bench, bed side commode, and Grab bars Bathroom setup: Tub/shower combo with grab bars, standard height toilet with bedside commode frame over top that has arm rests  PLOF:  Pt varies between independence and needing assistance pending on the task, likes to go get nails done, aunt drives patient  PATIENT GOALS: Use LUE better to do daily tasks such as washing dishes, bathing, and cooking  OBJECTIVE:   HAND DOMINANCE: Right  ADLs: Eating: Independent Grooming: Independent brushing teeth and washing face, aunt helps with putting on deodorant UB Dressing: Pt can get  a "big shirt on and off", assist with bra and jacket LB Dressing: Independent Toileting: Independent Bathing: Independent LB, assist with UB Tub Shower transfers: Utilizes tub bench, sits down to shower Equipment: Transfer tub bench, Grab bars, bed side commode, and  Reacher  IADLs: Shopping: Aunt performs grocery shopping, leisurely walks through [place], but not for shopping specifically Light housekeeping: Puts away dishes, aunt does more heavy household chores Meal Prep: Aunt makes her meals Community mobility: Utilizes quad cane at all times to include household mobility Medication management: Easy-open pill box Financial management: Independent Handwriting: 100% legible and Mild micrographia - pt reports baseline  MOBILITY STATUS: Independent  POSTURE COMMENTS:  No Significant postural limitations Sitting balance: Supports self with one extremity  ACTIVITY TOLERANCE: Activity tolerance: Pt reports no limitations with distance from cardiovascular standpoint, but muscles get fatigued  FUNCTIONAL OUTCOME MEASURES: FOTO: 33    UPPER EXTREMITY ROM:    RUE - AROM WNL LUE - PROM WFL, pt attempts to perform active shoulder flexion achieving approx 10-15 degrees (PROM up to 90*), no active elbow, wrist, or digit ROM  UPPER EXTREMITY MMT:     RUE - MMT WNL LUE - shoulder 2-/5, elbow 0/5, wrist 0/5, digits 0/5  HAND FUNCTION: Pt unable to move left hand at all  COORDINATION: RUE - no deficits; LUE - unable to test due to inability to actively move extremity  SENSATION: Light touch: Impaired - dull sensation LUE when compared to RUE  EDEMA: None  MUSCLE TONE: RUE: Within functional limits and LUE: Modifed Ashworth Scale 1+ = Slight increase in muscle tone, manifested by a catch, followed by minimal resistance throughout the remainder (less than half) of the ROM  COGNITION: Overall cognitive status:  Pt reports some difficulty with recall  VISION: Subjective report: No changes from CVA Baseline vision: Wears glasses all the time Visual history:  None  VISION ASSESSMENT: WFL  Patient has difficulty with following activities due to following visual impairments: N/A  PERCEPTION: WFL  PRAXIS: WFL  Evaluation OBSERVATIONS: Pt  with difficulty transferring from chair in lobby to standing with quad cane. Pt ambulating back to treatment clinic utilizing quad cane in RUE with minimal to no natural arm swing of LUE, compensatory walking pattern with left hip hike with swing, slower gait. Aunt is present with patient carrying other times.   TODAY'S TREATMENT:                                                                                                                              DATE:  03/11/23 Orthotic: Assessed patient for L UE resting hand splint as she reports where a soft "compression" glove to try and stretch her fingers open but they still flex and her thumb pulls across the palm.  PROM is fairly good to neutral position but AROM is poor for extension of digits ie) 1/5 MMT.  Explored options for resting hand splints and decided on RMI resting  hand splint for comfort, ease of cleaning and adjustability.    Therapeutic Exercises Reviewed AAROM/PROM of L UE due to hemiplegia and hypotonicity in shoulder with significant shoulder subluxation and increased flexor tone in hand.  MedBridge Exercises - demonstrated, reviewed, return demonstration and verbal understanding expressed for the following: - Seated Shoulder Shrugs  - 5 x daily - 5-10 reps -- Patient encouraged to perform exercise in front of the mirror in the bathoom 5+ times/day.  With shrug, she has increased hand flexion.  - Seated Wrist Supination PROM  - 1 x daily - 2-3 sets - 10 reps -- Patient only able to turn forearm to neutral - unable to rotate palm up.  - Supported Elbow Flexion/Extension PROM  - 1 x daily - 2 sets - 10 reps -- Patient encouraged to keep arm supported at elbow ie) on sofa pillow to keep shoulder in place and assist into FULL extension.  - Finger MP Flexion Extension  - 1 x daily - 2 sets - 10 repetitions --  Encourage her to work on full finger extension with plan to explore splinting for carryover of max ROM.   - Seated Elbow  Flexion Shoulder Internal Rotation AAROM at Table with Towel  - 1 x daily - 2 sets - 10 reps -- Tactile cues provided to work on isolating elbow movements and minimize shoulder compensation.   PATIENT EDUCATION: Education details: Review of LUE self-ROM HEP, hand self-ROM Person educated: Patient and aunt Education method: Explanation, Demonstration, Tactile cues, Verbal cues, and Handouts Education comprehension: verbalized understanding, returned demonstration, verbal cues required, tactile cues required, and needs further education  HOME EXERCISE PROGRAM: 02/11/23 - LUE stretch HEP  Date: 02/18/2023 Prepared by: Wyn Forster Reep 02/25/23: None 03/11/23: Access Code: [email or web address] Prepared by: Amada Kingfisher   GOALS: Goals reviewed with patient? Yes  SHORT TERM GOALS: Target date: 03/04/2023   Pt will be independent in LUE HEP. Baseline: Initiated Goal status: IN PROGRESS  2.  Pt will verbalize at least 2 adaptive equipment for completing ADLs. Baseline:  Goal status: IN PROGRESS  3. Pt will verbalize at least 2 memory compensation strategies. Baseline: Reports intermittent recall impairments Goal status: IN PROGRESS   LONG TERM GOALS: Target date: 03/25/2023   Pt will complete FOTO assessment at time of discharge scoring 39 or greater indicating functional progression with ADL and IADL completion. Baseline: score 33 Goal status: IN PROGRESS  2.  Pt will verbalize at least 3 compensatory methods for completing IADLs such as dish washing. Baseline:  Goal status: IN PROGRESS  3.  Pt will report completing UB bathing at minimal assistance level. Baseline: Mod assist Goal status: IN PROGRESS  4.  Pt will complete one simple meal with Modified independence utilizing DME/AE as needed. Baseline:  Goal status: IN PROGRESS  5.  Pt will verbalize resource to obtain fabricated splint if needed. Baseline:  Goal status: IN PROGRESS   ASSESSMENT:  CLINICAL IMPRESSION: Pt patient is  seen by this particular therapist for the first time today and is noted to have significant right hemiplegia with shoulder hypotonicity and subluxation that may potentially benefit from hemi-sling but also tone in hand which would benefit from resting hand splint.  Upon exploration of options patient is open to a prefabricated resting hand splint to allow max comfort, ease of application as well as cleaning.  Education continues for self-P/AAROM as much as possible.  Patient and aunt were encouraged to facilitate maximum independence with ADLs including allowing her extra  time as needed to complete tasks.  Pt continues to benefit from skilled OT services to address performance deficits as listed below, and to maximize independence in activities of daily living.  PERFORMANCE DEFICITS: in functional skills including ADLs, IADLs, coordination, dexterity, sensation, tone, ROM, strength, flexibility, Fine motor control, Gross motor control, mobility, balance, body mechanics, endurance, and UE functional use, cognitive skills including energy/drive and memory, and psychosocial skills including coping strategies and habits.   IMPAIRMENTS: are limiting patient from ADLs, IADLs, work, leisure, and social participation.   CO-MORBIDITIES: may have co-morbidities  that affects occupational performance. Patient will benefit from skilled OT to address above impairments and improve overall function.  REHAB POTENTIAL: Fair due to onset of CVA being 2021   PLAN:  OT FREQUENCY: 1x/week  OT DURATION: 6 weeks  PLANNED INTERVENTIONS: self care/ADL training, therapeutic exercise, therapeutic activity, neuromuscular re-education, manual therapy, passive range of motion, balance training, functional mobility training, splinting, electrical stimulation, paraffin, fluidotherapy, moist heat, cryotherapy, contrast bath, patient/family education, cognitive remediation/compensation, visual/perceptual remediation/compensation,  psychosocial skills training, energy conservation, coping strategies training, DME and/or AE instructions, and Re-evaluation  RECOMMENDED OTHER SERVICES: Currently has PT eval  CONSULTED AND AGREED WITH PLAN OF CARE: Patient and family member/caregiver  PLAN FOR NEXT SESSION:   Splint for L UE.  Review/progression of HEPs.   Ensure good participation with one-handed ADLs techniques with ADLs and IADL compensatory methods   Victorino Sparrow, OT 03/11/2023, 9:43 AM

## 2023-03-18 ENCOUNTER — Ambulatory Visit: Payer: 59 | Admitting: Occupational Therapy

## 2023-03-18 ENCOUNTER — Encounter: Payer: Self-pay | Admitting: Physical Therapy

## 2023-03-18 ENCOUNTER — Ambulatory Visit: Payer: 59 | Admitting: Physical Therapy

## 2023-03-18 DIAGNOSIS — I69354 Hemiplegia and hemiparesis following cerebral infarction affecting left non-dominant side: Secondary | ICD-10-CM

## 2023-03-18 DIAGNOSIS — M6281 Muscle weakness (generalized): Secondary | ICD-10-CM

## 2023-03-18 DIAGNOSIS — R2681 Unsteadiness on feet: Secondary | ICD-10-CM

## 2023-03-18 DIAGNOSIS — M24542 Contracture, left hand: Secondary | ICD-10-CM

## 2023-03-18 DIAGNOSIS — R278 Other lack of coordination: Secondary | ICD-10-CM

## 2023-03-18 DIAGNOSIS — R2689 Other abnormalities of gait and mobility: Secondary | ICD-10-CM

## 2023-03-18 NOTE — Therapy (Unsigned)
OUTPATIENT PHYSICAL THERAPY NEURO TREATMENT   Patient Name: Ebony Matthews MRN: [id number] DOB:Apr 06, 1981, 42 y.o., female Today's Date: 03/18/2023   PCP: Ellyn Hack, MD REFERRING PROVIDER: Drema Dallas, DO  END OF SESSION:  PT End of Session - 03/18/23 0934     Visit Number 5    Number of Visits 7   6 + eval   Date for PT Re-Evaluation 04/03/23   pushed out due to multi-D scheduling needs   Authorization Type [employer] MEDICARE    PT Start Time [phone]   handoff from OT   PT Stop Time 1015    PT Time Calculation (min) 42 min    Equipment Utilized During Treatment Gait belt    Activity Tolerance Patient tolerated treatment well    Behavior During Therapy Flat affect              Past Medical History:  Diagnosis Date   Diabetes mellitus without complication (HCC)    Hyperlipidemia    Hypertension    Loop recorder Biotronic 12/14/2020 12/14/2020   Scheduled Remote loop recorder check 12/14/2020: NSR. New implant.    Stroke [facility])    TIA (transient ischemic attack) 01/22/2019   Past Surgical History:  Procedure Laterality Date   NO PAST SURGERIES     Patient Active Problem List   Diagnosis Date Noted   Pain due to onychomycosis of toenails of both feet 10/24/2021   Blood clotting disorder (HCC) 10/24/2021   Loop recorder Biotronic 12/14/2020 12/14/2020   Uncontrolled type 2 diabetes mellitus with hyperglycemia, without long-term current use of insulin (HCC) 04/16/2020   CVA (cerebral vascular accident) (HCC) 04/15/2020   Cocaine use 04/15/2020   Acute CVA (cerebrovascular accident) (HCC) 03/04/2020   Stroke (cerebrum) (HCC) 03/03/2020   Weakness    Noncompliance with medication regimen    TIA (transient ischemic attack) 01/22/2019   Diabetes mellitus due to underlying condition without complications (HCC) 05/30/2014   Essential hypertension, benign 05/30/2014   Smoking 05/30/2014    ONSET DATE: CVA in 2021  REFERRING DIAG: G81.90 (ICD-10-CM)  - Hemiparesis, unspecified hemiparesis etiology, unspecified laterality (HCC) I63.9 (ICD-10-CM) - Cerebrovascular accident (CVA), unspecified mechanism (HCC)  THERAPY DIAG:  Hemiplegia and hemiparesis following cerebral infarction affecting left non-dominant side (HCC)  Muscle weakness (generalized)  Other lack of coordination  Unsteadiness on feet  Other abnormalities of gait and mobility  Rationale for Evaluation and Treatment: Rehabilitation  SUBJECTIVE:  SUBJECTIVE STATEMENT: Denies falls or acute changes.  She is ambulating with small base quad cane on right side and states this one is much lighter.  Pt has not attempted HEP much and has not worn her night splint for an entire night yet.   Pt accompanied by: family member- Aunt, [name]  PERTINENT HISTORY: DM2, HTN, tobacco use, TIA, CVA, cocaine use  PAIN:  Are you having pain? No  PRECAUTIONS: Fall and Other: loop recorder in place, no pacemaker/ICD  WEIGHT BEARING RESTRICTIONS: No  OBJECTIVE:   DIAGNOSTIC FINDINGS:  MRI of Brain 04/15/2020 IMPRESSION: Multiple new acute infarcts in the right cerebral hemisphere. Small acute infarct of the right pons.   Expected evolution of infarcts on the prior study.  COGNITION: Overall cognitive status: Within functional limits for tasks assessed   SENSATION: Light touch: WFL  COORDINATION: Unable to assess due to functional LLE weakness.  EDEMA:  None noted in BLE during eval.  MUSCLE TONE: No hypertonicity noted in LLE w/ passive eval; no clonus.  POSTURE: rounded shoulders, forward head, and weight shift right  LOWER EXTREMITY ROM/MMT:    PASSIVE was WNL on LLE except DF, pt contracted into PF 9 degrees. Active  Right Eval Left Eval  Hip flexion 5/5 2+/5  Hip extension     Hip abduction 5/5 3/5  Hip adduction 5/5 3+/5  Hip internal rotation    Hip external rotation    Knee flexion 5/5 2/5  Knee extension 5/5 Unable to adequately assess as pt compensates by flinging leg forward w/ hip flexion/hike  Ankle dorsiflexion 5/5 0/5  Ankle plantarflexion    Ankle inversion    Ankle eversion     (Blank rows = not tested)  BED MOBILITY:  Sit to supine Complete Independence Supine to sit Complete Independence Rolling to Right Complete Independence Rolling to Left Complete Independence  TRANSFERS: Assistive device utilized: Quad cane large base  Sit to stand: SBA Stand to sit: Modified independence Chair to chair: SBA  GAIT: Gait pattern: step to pattern, decreased arm swing- Left, decreased step length- Right, decreased stance time- Left, decreased stride length, decreased ankle dorsiflexion- Left, circumduction- Left, Left hip hike, genu recurvatum- Left, lateral lean- Right, wide BOS, abducted- Left, and poor foot clearance- Left Distance walked: various clinic distances Assistive device utilized: Quad cane large base Level of assistance: SBA and CGA  FUNCTIONAL TESTS:  5 times sit to stand: 37.56 seconds w/ RUE support from standard chair w/ all weight on RLE, LLE remains kicked forward and abducted during all STS Timed up and go (TUG): 43.66 sec w/ LBQC and CGA Berg Balance Scale: To be assessed.  PATIENT SURVEYS:  FOTO 56%  TODAY'S TREATMENT:                                                                                                                               TherAct: -5xSTS:  59.47 seconds w/ RUE support and maintained R  weight shift; had to restart assessment x4 due to difficulty performing STS and right tilt over chair arm. -TUG:  she prefers to have NBQC when offered [facility] by therapist; 48.78 sec w/ NBQC and singular LO due to wide and left rotated BOS  NMR: -At countertop lateral weight shifting w/ PT facilitating unlocked left knee  and increased weight bearing through LUE using folded pillow to bring surface height to patient contracture length w/ hand over hand to maintain, performed x3 minutes in widened BOS -STS x5 using // bars for initial pull to stand w/ facilitated left weight shift and unlocked knee during stance and return to sitting for improved left LE control, pt requires rest b/w reps and increased time for engagement to task -Advance retreat LLE in stance x3 rounds of varying reps w/ PT facilitating unlocked left knee during left stance; pt left LE fatigued on last round, provided seated rest and water prior to ambulating patient to clinic lobby.  PATIENT EDUCATION: Education details:  Encouraged pt to continue increasing her night splint wear time as she has not worn a full night yet.  Please attempt HEP-especially stretching ankle and STS w/ improved weight shift.  Please bring correct (right [employer]) cane to session for safety.  Plan for discharge at next and final PT session. Person educated: Patient Education method: Explanation and Handouts Education comprehension: verbalized understanding and needs further education  HOME EXERCISE PROGRAM: Access Code: [email or web address] URL: [email or web address] Date: 02/18/2023 Prepared by: Camille Bal  Exercises - Seated Ankle Dorsiflexion Stretch  - 1 x daily - 7 x weekly - 1 sets - 4 reps - 30-45 seconds hold - Sit to Stand with Armchair  - 1 x daily - 7 x weekly - 2 sets - 6-8 reps  GOALS: Goals reviewed with patient? Yes  SHORT TERM GOALS: Target date: 03/06/2023  Pt will be independent with strength and balance HEP with supervision from family. Baseline:  Updated, pt not compliant (6/12) Goal status: IN PROGRESS  2.  Pt will decrease 5xSTS to </=32.56 seconds in order to demonstrate decreased risk for falls and improved functional bilateral LE strength and power. Baseline: 37.56 seconds w/ RUE support from standard chair w/ all weight on RLE;  59.47 seconds w/ RUE support and maintained R weight shift (6/19) Goal status: NOT MET  3.  Pt will demonstrate TUG of </=33.66 seconds in order to decrease risk of falls and improve functional mobility using LRAD. Baseline: 43.66 sec w/ LBQC and CGA; 48.78 sec w/ NBQC (6/19) Goal status: NOT MET  LONG TERM GOALS: Target date: 03/27/2023  Pt will increase FOTO score to 63% in order to demonstrate subjective functional improvement. Baseline: 56% Goal status: INITIAL  2.  Pt will decrease 5xSTS to </=27.56 seconds in order to demonstrate decreased risk for falls and improved functional bilateral LE strength and power. Baseline: 37.56 seconds w/ RUE support from standard chair w/ all weight on RLE Goal status: INITIAL  3.  Pt will demonstrate TUG of </=23.66 seconds in order to decrease risk of falls and improve functional mobility using LRAD. Baseline: 43.66 sec w/ LBQC and CGA Goal status: INITIAL  4.  Pt will increase BERG balance score to >/=26/56 to demonstrate improved static balance. Baseline: 22/56 (5/22) Goal status: INITIAL  ASSESSMENT:  CLINICAL IMPRESSION: Completed STG assessment this session with patient scores declining.  She remains limited by decreased activity outside the clinic, chronic gait deficits, and inability to utilize AFO due to PF contracture.  Will plan  to assess LTGs, including reassessment of TUG and 5xSTS, at next session in preparation for discharge.  OBJECTIVE IMPAIRMENTS: Abnormal gait, decreased activity tolerance, decreased balance, decreased coordination, decreased endurance, decreased knowledge of condition, decreased knowledge of use of DME, decreased mobility, difficulty walking, decreased ROM, decreased strength, decreased safety awareness, hypomobility, impaired flexibility, impaired UE functional use, improper body mechanics, and postural dysfunction.   ACTIVITY LIMITATIONS: carrying, lifting, bending, standing, stairs, transfers, bathing,  toileting, dressing, reach over head, hygiene/grooming, locomotion level, and caring for others  PARTICIPATION LIMITATIONS: meal prep, cleaning, laundry, medication management, interpersonal relationship, driving, shopping, community activity, and occupation  PERSONAL FACTORS: Age, Behavior pattern, Fitness, Past/current experiences, Social background, Time since onset of injury/illness/exacerbation, Transportation, and 1-2 comorbidities: DM2, HTN  are also affecting patient's functional outcome.   REHAB POTENTIAL: Fair See PMH and personal factors  CLINICAL DECISION MAKING: Evolving/moderate complexity  EVALUATION COMPLEXITY: Moderate  PLAN:  PT FREQUENCY: 1x/week  PT DURATION: 6 weeks  PLANNED INTERVENTIONS: Therapeutic exercises, Therapeutic activity, Neuromuscular re-education, Balance training, Gait training, Patient/Family education, Self Care, Joint mobilization, Stair training, Vestibular training, Orthotic/Fit training, DME instructions, and Re-evaluation  PLAN FOR NEXT SESSION:  ASSESS LTG-discharge!   Sadie Haber, PT, DPT 03/18/2023, 10:15 AM

## 2023-03-18 NOTE — Therapy (Signed)
OUTPATIENT OCCUPATIONAL THERAPY NEURO TREATMENT  Patient Name: Ebony Matthews MRN: [id number] DOB:1981-03-03, 42 y.o., female Today's Date: 03/18/2023  PCP: Ellyn Hack, MD REFERRING PROVIDER: Drema Dallas, DO  END OF SESSION:  OT End of Session - 03/18/23 0855     Visit Number 5    OT Start Time 0855    OT Stop Time 0930    OT Time Calculation (min) 35 min    Activity Tolerance Patient tolerated treatment well    Behavior During Therapy Flat affect;WFL for tasks assessed/performed             Past Medical History:  Diagnosis Date   Diabetes mellitus without complication (HCC)    Hyperlipidemia    Hypertension    Loop recorder [employer] 12/14/2020 12/14/2020   Scheduled Remote loop recorder check 12/14/2020: NSR. New implant.    Stroke [facility])    TIA (transient ischemic attack) 01/22/2019   Past Surgical History:  Procedure Laterality Date   NO PAST SURGERIES     Patient Active Problem List   Diagnosis Date Noted   Pain due to onychomycosis of toenails of both feet 10/24/2021   Blood clotting disorder (HCC) 10/24/2021   Loop recorder [employer] 12/14/2020 12/14/2020   Uncontrolled type 2 diabetes mellitus with hyperglycemia, without long-term current use of insulin (HCC) 04/16/2020   CVA (cerebral vascular accident) (HCC) 04/15/2020   Cocaine use 04/15/2020   Acute CVA (cerebrovascular accident) (HCC) 03/04/2020   Stroke (cerebrum) (HCC) 03/03/2020   Weakness    Noncompliance with medication regimen    TIA (transient ischemic attack) 01/22/2019   Diabetes mellitus due to underlying condition without complications (HCC) 05/30/2014   Essential hypertension, benign 05/30/2014   Smoking 05/30/2014    ONSET DATE: 01/14/2023 (referral date)  REFERRING DIAG: G81.90 (ICD-10-CM) - Hemiparesis, unspecified hemiparesis etiology, unspecified laterality (HCC) I63.9 (ICD-10-CM) - Cerebrovascular accident (CVA), unspecified mechanism (HCC)  THERAPY DIAG:   Hemiplegia and hemiparesis following cerebral infarction affecting left non-dominant side (HCC)  Muscle weakness (generalized)  Other lack of coordination  Contracture of joint of left hand  Rationale for Evaluation and Treatment: Rehabilitation  SUBJECTIVE:   SUBJECTIVE STATEMENT: Pt reports going by [name]. Pt reports feeling good today.  She reports difficulty with table top activities due to difficulty getting to the kitchen table.  Pt accompanied by: self   PERTINENT HISTORY: History of multiple strokes with residual deficits, right ICA occlusion, hypertension, diabetes, history of cocaine use, former smoker  PRECAUTIONS: Other: Loop implant to monitor for atrial fibrillation  WEIGHT BEARING RESTRICTIONS: No  PAIN:  Are you having pain? No  FALLS: Has patient fallen in last 6 months? No  LIVING ENVIRONMENT: Lives with: lives alone Lives in: House/apartment - 1st floor apartment Stairs: No Has following equipment at home: Quad cane large base, Hemi walker, Shower bench, bed side commode, and Grab bars Bathroom setup: Tub/shower combo with grab bars, standard height toilet with bedside commode frame over top that has arm rests  PLOF:  Pt varies between independence and needing assistance pending on the task, likes to go get nails done, aunt drives patient  PATIENT GOALS: Use LUE better to do daily tasks such as washing dishes, bathing, and cooking  OBJECTIVE:   HAND DOMINANCE: Right  ADLs: Eating: Independent Grooming: Independent brushing teeth and washing face, aunt helps with putting on deodorant UB Dressing: Pt can get a "big shirt on and off", assist with bra and jacket LB Dressing: Independent Toileting: Independent Bathing: Independent  LB, assist with UB Tub Shower transfers: Utilizes tub bench, sits down to shower Equipment: Transfer tub bench, Grab bars, bed side commode, and Reacher  IADLs: Shopping: Aunt performs grocery shopping, leisurely walks  through [place], but not for shopping specifically Light housekeeping: Puts away dishes, aunt does more heavy household chores Meal Prep: Aunt makes her meals Community mobility: Utilizes quad cane at all times to include household mobility Medication management: Easy-open pill box Financial management: Independent Handwriting: 100% legible and Mild micrographia - pt reports baseline  MOBILITY STATUS: Independent  POSTURE COMMENTS:  No Significant postural limitations Sitting balance: Supports self with one extremity  ACTIVITY TOLERANCE: Activity tolerance: Pt reports no limitations with distance from cardiovascular standpoint, but muscles get fatigued  FUNCTIONAL OUTCOME MEASURES: FOTO: 33    UPPER EXTREMITY ROM:    RUE - AROM WNL LUE - PROM WFL, pt attempts to perform active shoulder flexion achieving approx 10-15 degrees (PROM up to 90*), no active elbow, wrist, or digit ROM  UPPER EXTREMITY MMT:     RUE - MMT WNL LUE - shoulder 2-/5, elbow 0/5, wrist 0/5, digits 0/5  HAND FUNCTION: Pt unable to move left hand at all  COORDINATION: RUE - no deficits; LUE - unable to test due to inability to actively move extremity  SENSATION: Light touch: Impaired - dull sensation LUE when compared to RUE  EDEMA: None  MUSCLE TONE: RUE: Within functional limits and LUE: Modifed Ashworth Scale 1+ = Slight increase in muscle tone, manifested by a catch, followed by minimal resistance throughout the remainder (less than half) of the ROM  COGNITION: Overall cognitive status:  Pt reports some difficulty with recall  VISION: Subjective report: No changes from CVA Baseline vision: Wears glasses all the time Visual history:  None  VISION ASSESSMENT: WFL  Patient has difficulty with following activities due to following visual impairments: N/A  PERCEPTION: WFL  PRAXIS: WFL  Evaluation OBSERVATIONS: Pt with difficulty transferring from chair in lobby to standing with quad cane.  Pt ambulating back to treatment clinic utilizing quad cane in RUE with minimal to no natural arm swing of LUE, compensatory walking pattern with left hip hike with swing, slower gait. Aunt is present with patient carrying other times.   TODAY'S TREATMENT:                                                                                                                              DATE:  03/18/23   Therapeutic Exercises Reviewed AAROM/PROM of L UE due to hemiplegia and hypotonicity in shoulder with improvements in shoulder subluxation with shoulder shrug exercises .  MedBridge Exercises reviewed and practiced to facilitate return demonstration and modified positioning for progression of exercises including : - Seated Shoulder Shrugs - with good success at multiple repetitions.  - Seated Wrist Supination AA/PROM  - with patient encouraged to push forearm past neutral, hold stretch 5+ seconds and turn hand upright as able.  -  Supported Elbow Flexion/Extension AA/PROM  with patient  encouraged to isolate motions at elbow ie) minimize shoulder movements and push into MAX extension.  - Finger MP Flexion Extension  -  with patient encouraged her to work on full finger extension   - Seated Elbow Flexion Shoulder Internal Rotation AAROM at Table with Towel - with tactile cues to isolate elbow movements and minimize shoulder compensation.   Manual Therapy: Joint mobilizations to help improve L digital mobility and range of motion used to address stiffness in combination with soft tissue mobilization ie) physically stretching digits and wrist simultaneously to improve flexibility and restore mobility in joints with need for splint to maintain ROM.   RE: Orthotic: Patient is out of network for orthotist for resting hand splint and will still benefit from resting hand splint for contracture management of LUE.  Will explore options for pre fab vs custom for ROM of L digits and wrist as prefab offers more  comfort, ease of cleaning and adjustability.   PATIENT EDUCATION: Education details: Review of LUE self-ROM HEP, hand self-ROM Person educated: Patient Education method: Explanation, Demonstration, Tactile cues, Verbal cues, Handouts, and from previous session Education comprehension: verbalized understanding, returned demonstration, verbal cues required, tactile cues required, and needs further education  HOME EXERCISE PROGRAM: 02/11/23 - LUE stretch HEP  Date: 02/18/2023 Prepared by: Wyn Forster Reep 02/25/23: None 03/11/23: Access Code: [email or web address] Prepared by: Amada Kingfisher   GOALS: Goals reviewed with patient? Yes  SHORT TERM GOALS: Target date: 03/04/2023   Pt will be independent in LUE HEP. Baseline: Initiated Goal status: IN PROGRESS  2.  Pt will verbalize at least 2 adaptive equipment for completing ADLs. Baseline:  Goal status: IN PROGRESS  3. Pt will verbalize at least 2 memory compensation strategies. Baseline: Reports intermittent recall impairments Goal status: IN PROGRESS   LONG TERM GOALS: Target date: 03/25/2023   Pt will complete FOTO assessment at time of discharge scoring 39 or greater indicating functional progression with ADL and IADL completion. Baseline: score 33 Goal status: IN PROGRESS  2.  Pt will verbalize at least 3 compensatory methods for completing IADLs such as dish washing. Baseline:  Goal status: IN PROGRESS  3.  Pt will report completing UB bathing at minimal assistance level. Baseline: Mod assist Goal status: IN PROGRESS  4.  Pt will complete one simple meal with Modified independence utilizing DME/AE as needed. Baseline:  Goal status: IN PROGRESS  5.  Pt will verbalize resource to obtain fabricated splint if needed. Baseline:  Goal status: IN PROGRESS   ASSESSMENT:  CLINICAL IMPRESSION: Pt patient has significant right hemiplegia with shoulder hypotonicity and subluxation that may potentially benefit from hemi-sling but also tone  in hand which will benefit from a resting hand splint.  She is out of network for orthotist therefore may fabricate a custom splint with additional padding.  A prefabricated resting hand splint to allow max comfort, ease of application as well as cleaning is ideal though.  Education continued for self-P/AAROM of L UE.  Patient continues to benefit from skilled OT services to address performance deficits as a result of L hemiplegia and maximize independence in activities of daily living.  PERFORMANCE DEFICITS: in functional skills including ADLs, IADLs, coordination, dexterity, sensation, tone, ROM, strength, flexibility, Fine motor control, Gross motor control, mobility, balance, body mechanics, endurance, and UE functional use, cognitive skills including energy/drive and memory, and psychosocial skills including coping strategies and habits.   IMPAIRMENTS: are limiting patient from ADLs, IADLs, work, leisure,  and social participation.   CO-MORBIDITIES: may have co-morbidities  that affects occupational performance. Patient will benefit from skilled OT to address above impairments and improve overall function.  REHAB POTENTIAL: Fair due to onset of CVA being 2021   PLAN:  OT FREQUENCY: 1x/week  OT DURATION: 6 weeks  PLANNED INTERVENTIONS: self care/ADL training, therapeutic exercise, therapeutic activity, neuromuscular re-education, manual therapy, passive range of motion, balance training, functional mobility training, splinting, electrical stimulation, paraffin, fluidotherapy, moist heat, cryotherapy, contrast bath, patient/family education, cognitive remediation/compensation, visual/perceptual remediation/compensation, psychosocial skills training, energy conservation, coping strategies training, DME and/or AE instructions, and Re-evaluation  RECOMMENDED OTHER SERVICES: Currently has PT eval  CONSULTED AND AGREED WITH PLAN OF CARE: Patient and family member/caregiver  PLAN FOR NEXT SESSION:    Fabricate splint for L UE.  Renew POC.  Review/progression of HEPs.   Ensure good participation with one-handed ADLs techniques with ADLs and IADL compensatory methods   Victorino Sparrow, OT 03/18/2023, 9:40 AM

## 2023-04-01 ENCOUNTER — Ambulatory Visit: Payer: 59 | Attending: Neurology | Admitting: Physical Therapy

## 2023-04-01 ENCOUNTER — Encounter: Payer: Self-pay | Admitting: Physical Therapy

## 2023-04-01 ENCOUNTER — Ambulatory Visit: Payer: 59 | Admitting: Occupational Therapy

## 2023-04-01 DIAGNOSIS — M24542 Contracture, left hand: Secondary | ICD-10-CM

## 2023-04-01 DIAGNOSIS — R278 Other lack of coordination: Secondary | ICD-10-CM | POA: Diagnosis present

## 2023-04-01 DIAGNOSIS — G8114 Spastic hemiplegia affecting left nondominant side: Secondary | ICD-10-CM | POA: Diagnosis present

## 2023-04-01 DIAGNOSIS — M6281 Muscle weakness (generalized): Secondary | ICD-10-CM

## 2023-04-01 DIAGNOSIS — I69354 Hemiplegia and hemiparesis following cerebral infarction affecting left non-dominant side: Secondary | ICD-10-CM

## 2023-04-01 DIAGNOSIS — R2681 Unsteadiness on feet: Secondary | ICD-10-CM | POA: Insufficient documentation

## 2023-04-01 DIAGNOSIS — R2689 Other abnormalities of gait and mobility: Secondary | ICD-10-CM | POA: Diagnosis present

## 2023-04-01 NOTE — Therapy (Signed)
OUTPATIENT PHYSICAL THERAPY NEURO TREATMENT -DISCHARGE SUMMARY   Patient Name: Ebony Matthews MRN: [id number] DOB:1981-08-03, 42 y.o., female Today's Date: 04/01/2023   PCP: Ellyn Hack, MD REFERRING PROVIDER: Drema Dallas, DO  PHYSICAL THERAPY DISCHARGE SUMMARY  Visits from Start of Care: 6  Current functional level related to goals / functional outcomes: See clinical impression statement.   Remaining deficits: Left hemiparesis, unable to safely brace left foot, use of NBQC-may be more stable w/ LBQC as had prior, but pt prefers NBQC, flat affect   Education / Equipment: Progress towards goals and plan for discharge today.  Return as needed with new referral after at least 3 months of consistent effort with movement and techniques educated on at home.   Patient agrees to discharge. Patient goals were partially met. Patient is being discharged due to maximized rehab potential.   END OF SESSION:  PT End of Session - 04/01/23 0900     Visit Number 6    Number of Visits 7   6 + eval   Date for PT Re-Evaluation 04/03/23   pushed out due to multi-D scheduling needs   Authorization Type [employer]    PT Start Time [phone]   patient arrives late   PT Stop Time 0923    PT Time Calculation (min) 28 min    Equipment Utilized During Treatment Gait belt    Activity Tolerance Patient tolerated treatment well    Behavior During Therapy Flat affect              Past Medical History:  Diagnosis Date   Diabetes mellitus without complication (HCC)    Hyperlipidemia    Hypertension    Loop recorder [employer] 12/14/2020 12/14/2020   Scheduled Remote loop recorder check 12/14/2020: NSR. New implant.    Stroke [facility])    TIA (transient ischemic attack) 01/22/2019   Past Surgical History:  Procedure Laterality Date   NO PAST SURGERIES     Patient Active Problem List   Diagnosis Date Noted   Pain due to onychomycosis of toenails of both feet 10/24/2021    Blood clotting disorder (HCC) 10/24/2021   Loop recorder [employer] 12/14/2020 12/14/2020   Uncontrolled type 2 diabetes mellitus with hyperglycemia, without long-term current use of insulin (HCC) 04/16/2020   CVA (cerebral vascular accident) (HCC) 04/15/2020   Cocaine use 04/15/2020   Acute CVA (cerebrovascular accident) (HCC) 03/04/2020   Stroke (cerebrum) (HCC) 03/03/2020   Weakness    Noncompliance with medication regimen    TIA (transient ischemic attack) 01/22/2019   Diabetes mellitus due to underlying condition without complications (HCC) 05/30/2014   Essential hypertension, benign 05/30/2014   Smoking 05/30/2014    ONSET DATE: CVA in 2021  REFERRING DIAG: G81.90 (ICD-10-CM) - Hemiparesis, unspecified hemiparesis etiology, unspecified laterality (HCC) I63.9 (ICD-10-CM) - Cerebrovascular accident (CVA), unspecified mechanism (HCC)  THERAPY DIAG:  Hemiplegia and hemiparesis following cerebral infarction affecting left non-dominant side (HCC)  Muscle weakness (generalized)  Other lack of coordination  Unsteadiness on feet  Other abnormalities of gait and mobility  Rationale for Evaluation and Treatment: Rehabilitation  SUBJECTIVE:  SUBJECTIVE STATEMENT: Denies falls or acute changes.  She is ambulating with small base quad cane on right side.     Pt accompanied by: family member- Aunt, [name]  PERTINENT HISTORY: DM2, HTN, tobacco use, TIA, CVA, cocaine use  PAIN:  Are you having pain? No  PRECAUTIONS: Fall and Other: loop recorder in place, no pacemaker/ICD  WEIGHT BEARING RESTRICTIONS: No  OBJECTIVE:   DIAGNOSTIC FINDINGS:  MRI of Brain 04/15/2020 IMPRESSION: Multiple new acute infarcts in the right cerebral hemisphere. Small acute infarct of the right pons.   Expected  evolution of infarcts on the prior study.  COGNITION: Overall cognitive status: Within functional limits for tasks assessed   SENSATION: Light touch: WFL  COORDINATION: Unable to assess due to functional LLE weakness.  EDEMA:  None noted in BLE during eval.  MUSCLE TONE: No hypertonicity noted in LLE w/ passive eval; no clonus.  POSTURE: rounded shoulders, forward head, and weight shift right  LOWER EXTREMITY ROM/MMT:    PASSIVE was WNL on LLE except DF, pt contracted into PF 9 degrees. Active  Right Eval Left Eval  Hip flexion 5/5 2+/5  Hip extension    Hip abduction 5/5 3/5  Hip adduction 5/5 3+/5  Hip internal rotation    Hip external rotation    Knee flexion 5/5 2/5  Knee extension 5/5 Unable to adequately assess as pt compensates by flinging leg forward w/ hip flexion/hike  Ankle dorsiflexion 5/5 0/5  Ankle plantarflexion    Ankle inversion    Ankle eversion     (Blank rows = not tested)  BED MOBILITY:  Sit to supine Complete Independence Supine to sit Complete Independence Rolling to Right Complete Independence Rolling to Left Complete Independence  TRANSFERS: Assistive device utilized: Quad cane large base  Sit to stand: SBA Stand to sit: Modified independence Chair to chair: SBA  GAIT: Gait pattern: step to pattern, decreased arm swing- Left, decreased step length- Right, decreased stance time- Left, decreased stride length, decreased ankle dorsiflexion- Left, circumduction- Left, Left hip hike, genu recurvatum- Left, lateral lean- Right, wide BOS, abducted- Left, and poor foot clearance- Left Distance walked: various clinic distances Assistive device utilized: Quad cane large base Level of assistance: SBA and CGA  FUNCTIONAL TESTS:  5 times sit to stand: 37.56 seconds w/ RUE support from standard chair w/ all weight on RLE, LLE remains kicked forward and abducted during all STS Timed up and go (TUG): 43.66 sec w/ LBQC and CGA Berg Balance Scale: To  be assessed.  PATIENT SURVEYS:  FOTO 56%  TODAY'S TREATMENT:                                                                                                                               TherAct: -FOTO:  54% (PT reads questions and answer choices to pt per pt preference) -5xSTS:  27.75 seconds weight remains on RUE/RLE -BERG:  [facility] PT Assessment - 04/01/23 4098  Berg Balance Test   Sit to Stand Able to stand using hands after several tries    Standing Unsupported Able to stand safely 2 minutes    Sitting with Back Unsupported but Feet Supported on Floor or Stool Able to sit safely and securely 2 minutes    Stand to Sit Controls descent by using hands    Transfers Able to transfer safely, definite need of hands    Standing Unsupported with Eyes Closed Able to stand 10 seconds safely    Standing Unsupported with Feet Together Needs help to attain position and unable to hold for 15 seconds    From Standing, Reach Forward with Outstretched Arm Can reach forward >12 cm safely (5")    From Standing Position, Pick up Object from Floor Unable to pick up shoe, but reaches 2-5 cm (1-2") from shoe and balances independently    From Standing Position, Turn to Look Behind Over each Shoulder Turn sideways only but maintains balance    Turn 360 Degrees Needs assistance while turning    Standing Unsupported, Alternately Place Feet on Step/Stool Able to complete >2 steps/needs minimal assist    Standing Unsupported, One Foot in Front Needs help to step but can hold 15 seconds    Standing on One Leg Tries to lift leg/unable to hold 3 seconds but remains standing independently    Total Score 30    Berg comment: 30/56 = significant fall risk            -TUG:  36.35 seconds w/ NBQC and CGA  PATIENT EDUCATION: Education details:  Progress towards goals and plan for discharge today.  Return as needed with new referral after at least 3 months of consistent effort with movement and techniques  educated on at home. Person educated: Patient Education method: Explanation and Handouts Education comprehension: verbalized understanding and needs further education  HOME EXERCISE PROGRAM: Access Code: [email or web address] URL: [email or web address] Date: 02/18/2023 Prepared by: Camille Bal  Exercises - Seated Ankle Dorsiflexion Stretch  - 1 x daily - 7 x weekly - 1 sets - 4 reps - 30-45 seconds hold - Sit to Stand with Armchair  - 1 x daily - 7 x weekly - 2 sets - 6-8 reps  GOALS: Goals reviewed with patient? Yes  SHORT TERM GOALS: Target date: 03/06/2023  Pt will be independent with strength and balance HEP with supervision from family. Baseline:  Updated, pt not compliant (6/12) Goal status: IN PROGRESS  2.  Pt will decrease 5xSTS to </=32.56 seconds in order to demonstrate decreased risk for falls and improved functional bilateral LE strength and power. Baseline: 37.56 seconds w/ RUE support from standard chair w/ all weight on RLE; 59.47 seconds w/ RUE support and maintained R weight shift (6/19) Goal status: NOT MET  3.  Pt will demonstrate TUG of </=33.66 seconds in order to decrease risk of falls and improve functional mobility using LRAD. Baseline: 43.66 sec w/ LBQC and CGA; 48.78 sec w/ NBQC (6/19) Goal status: NOT MET  LONG TERM GOALS: Target date: 03/27/2023  Pt will increase FOTO score to 63% in order to demonstrate subjective functional improvement. Baseline: 56%; 54% (7/3) Goal status: NOT MET  2.  Pt will decrease 5xSTS to </=27.56 seconds in order to demonstrate decreased risk for falls and improved functional bilateral LE strength and power. Baseline: 37.56 seconds w/ RUE support from standard chair w/ all weight on RLE; 27.75 seconds weight remains on RUE/RLE (7/3) Goal status: IN PROGRESS  3.  Pt will demonstrate TUG of </=23.66 seconds in order to decrease risk of falls and improve functional mobility using LRAD. Baseline: 43.66 sec w/ LBQC and  CGA; 36.35 seconds w/ NBQC and CGA (7/3) Goal status: IN PROGRESS  4.  Pt will increase BERG balance score to >/=26/56 to demonstrate improved static balance. Baseline: 22/56 (5/22); 30/56 (7/3) Goal status: MET  ASSESSMENT:  CLINICAL IMPRESSION: Patient has had back to back goal assessments with wide fluctuation in progress.  She remains inactive at home based on reports and continues to maintain right weight bearing posture despite efforts and education to correct with repetition.  Patient has reached her maximum potential with PT at current due to lack of noted carryover from session to session in regards to safety and correction of chronic deficits.  Her FOTO score declined to 54% from prior 56%.  She did improve her BERG to 30/56 from 22/56 initially.  Her TUG improved to 36.35 seconds from initial 43.66 on evaluation and 48.78 seconds on STG assessment at session prior.  Her 5xSTS improved to just below goal level at 27.75 seconds compared to 37.56 seconds on initial assessment and almost 1 minute on STG assessment.  Based on findings PT feels fluctuation in performance is expected for this patient due to a variety of factors.  She continues to need stretching of the left ankle over a prolonged period before bracing is a safe option again.  Due to this and other factors mentioned prior patient is stable and appropriate for discharge.  She would likely benefit from episodic care in the future to ensure she does not further regress.  Will discharge at this time with patient and caregiver in agreement.  OBJECTIVE IMPAIRMENTS: Abnormal gait, decreased activity tolerance, decreased balance, decreased coordination, decreased endurance, decreased knowledge of condition, decreased knowledge of use of DME, decreased mobility, difficulty walking, decreased ROM, decreased strength, decreased safety awareness, hypomobility, impaired flexibility, impaired UE functional use, improper body mechanics, and postural  dysfunction.   ACTIVITY LIMITATIONS: carrying, lifting, bending, standing, stairs, transfers, bathing, toileting, dressing, reach over head, hygiene/grooming, locomotion level, and caring for others  PARTICIPATION LIMITATIONS: meal prep, cleaning, laundry, medication management, interpersonal relationship, driving, shopping, community activity, and occupation  PERSONAL FACTORS: Age, Behavior pattern, Fitness, Past/current experiences, Social background, Time since onset of injury/illness/exacerbation, Transportation, and 1-2 comorbidities: DM2, HTN  are also affecting patient's functional outcome.   REHAB POTENTIAL: Fair See PMH and personal factors  CLINICAL DECISION MAKING: Evolving/moderate complexity  EVALUATION COMPLEXITY: Moderate  PLAN:  PT FREQUENCY: 1x/week  PT DURATION: 6 weeks  PLANNED INTERVENTIONS: Therapeutic exercises, Therapeutic activity, Neuromuscular re-education, Balance training, Gait training, Patient/Family education, Self Care, Joint mobilization, Stair training, Vestibular training, Orthotic/Fit training, DME instructions, and Re-evaluation  PLAN FOR NEXT SESSION:  N/A   Sadie Haber, PT, DPT 04/01/2023, 9:25 AM

## 2023-04-01 NOTE — Therapy (Unsigned)
OUTPATIENT OCCUPATIONAL THERAPY NEURO TREATMENT  Patient Name: Ebony Matthews MRN: [id number] DOB:22-Mar-1981, 42 y.o., female Today's Date: 04/01/2023  PCP: Ellyn Hack, MD REFERRING PROVIDER: Drema Dallas, DO  END OF SESSION:  OT End of Session - 04/01/23 0934     Visit Number 6    Number of Visits 6    OT Start Time 0931    OT Stop Time 1015    OT Time Calculation (min) 44 min    Equipment Utilized During Treatment Kitchen items    Activity Tolerance Patient tolerated treatment well    Behavior During Therapy Flat affect;WFL for tasks assessed/performed             Past Medical History:  Diagnosis Date   Diabetes mellitus without complication (HCC)    Hyperlipidemia    Hypertension    Loop recorder [employer] 12/14/2020 12/14/2020   Scheduled Remote loop recorder check 12/14/2020: NSR. New implant.    Stroke [facility])    TIA (transient ischemic attack) 01/22/2019   Past Surgical History:  Procedure Laterality Date   NO PAST SURGERIES     Patient Active Problem List   Diagnosis Date Noted   Pain due to onychomycosis of toenails of both feet 10/24/2021   Blood clotting disorder (HCC) 10/24/2021   Loop recorder [employer] 12/14/2020 12/14/2020   Uncontrolled type 2 diabetes mellitus with hyperglycemia, without long-term current use of insulin (HCC) 04/16/2020   CVA (cerebral vascular accident) (HCC) 04/15/2020   Cocaine use 04/15/2020   Acute CVA (cerebrovascular accident) (HCC) 03/04/2020   Stroke (cerebrum) (HCC) 03/03/2020   Weakness    Noncompliance with medication regimen    TIA (transient ischemic attack) 01/22/2019   Diabetes mellitus due to underlying condition without complications (HCC) 05/30/2014   Essential hypertension, benign 05/30/2014   Smoking 05/30/2014    ONSET DATE: 01/14/2023 (referral date)  REFERRING DIAG: G81.90 (ICD-10-CM) - Hemiparesis, unspecified hemiparesis etiology, unspecified laterality (HCC) I63.9 (ICD-10-CM) -  Cerebrovascular accident (CVA), unspecified mechanism (HCC)  THERAPY DIAG:  Muscle weakness (generalized)  Contracture of joint of left hand  Hemiplegia and hemiparesis following cerebral infarction affecting left non-dominant side (HCC)  Rationale for Evaluation and Treatment: Rehabilitation  SUBJECTIVE:   SUBJECTIVE STATEMENT: Pt reports going by [name]. Pt reports feeling good today.  She reports difficulty with table top activities due to difficulty getting to the kitchen table.  Pt accompanied by: self   PERTINENT HISTORY: History of multiple strokes with residual deficits, right ICA occlusion, hypertension, diabetes, history of cocaine use, former smoker  PRECAUTIONS: Other: Loop implant to monitor for atrial fibrillation  WEIGHT BEARING RESTRICTIONS: No  PAIN:  Are you having pain? No  FALLS: Has patient fallen in last 6 months? No  LIVING ENVIRONMENT: Lives with: lives alone Lives in: House/apartment - 1st floor apartment Stairs: No Has following equipment at home: Quad cane large base, Hemi walker, Shower bench, bed side commode, and Grab bars Bathroom setup: Tub/shower combo with grab bars, standard height toilet with bedside commode frame over top that has arm rests  PLOF:  Pt varies between independence and needing assistance pending on the task, likes to go get nails done, aunt drives patient  PATIENT GOALS: Use LUE better to do daily tasks such as washing dishes, bathing, and cooking  OBJECTIVE:   HAND DOMINANCE: Right  ADLs: Eating: Independent Grooming: Independent brushing teeth and washing face, aunt helps with putting on deodorant UB Dressing: Pt can get a "big shirt on and off", assist  with bra and jacket LB Dressing: Independent Toileting: Independent Bathing: Independent LB, assist with UB Tub Shower transfers: Utilizes tub bench, sits down to shower Equipment: Transfer tub bench, Grab bars, bed side commode, and Reacher  IADLs: Shopping:  Aunt performs grocery shopping, leisurely walks through [place], but not for shopping specifically Light housekeeping: Puts away dishes, aunt does more heavy household chores Meal Prep: Aunt makes her meals Community mobility: Utilizes quad cane at all times to include household mobility Medication management: Easy-open pill box Financial management: Independent Handwriting: 100% legible and Mild micrographia - pt reports baseline  MOBILITY STATUS: Independent  POSTURE COMMENTS:  No Significant postural limitations Sitting balance: Supports self with one extremity  ACTIVITY TOLERANCE: Activity tolerance: Pt reports no limitations with distance from cardiovascular standpoint, but muscles get fatigued  FUNCTIONAL OUTCOME MEASURES: FOTO: 33    UPPER EXTREMITY ROM:    RUE - AROM WNL LUE - PROM WFL, pt attempts to perform active shoulder flexion achieving approx 10-15 degrees (PROM up to 90*), no active elbow, wrist, or digit ROM  UPPER EXTREMITY MMT:     RUE - MMT WNL LUE - shoulder 2-/5, elbow 0/5, wrist 0/5, digits 0/5  HAND FUNCTION: Pt unable to move left hand at all  COORDINATION: RUE - no deficits; LUE - unable to test due to inability to actively move extremity  SENSATION: Light touch: Impaired - dull sensation LUE when compared to RUE  EDEMA: None  MUSCLE TONE: RUE: Within functional limits and LUE: Modifed Ashworth Scale 1+ = Slight increase in muscle tone, manifested by a catch, followed by minimal resistance throughout the remainder (less than half) of the ROM  COGNITION: Overall cognitive status:  Pt reports some difficulty with recall  VISION: Subjective report: No changes from CVA Baseline vision: Wears glasses all the time Visual history:  None  VISION ASSESSMENT: WFL  Patient has difficulty with following activities due to following visual impairments: N/A  PERCEPTION: WFL  PRAXIS: WFL  Evaluation OBSERVATIONS: Pt with difficulty transferring  from chair in lobby to standing with quad cane. Pt ambulating back to treatment clinic utilizing quad cane in RUE with minimal to no natural arm swing of LUE, compensatory walking pattern with left hip hike with swing, slower gait. Aunt is present with patient carrying other times.   TODAY'S TREATMENT:                                                                                                                              DATE:  03/18/23   Therapeutic Exercises Reviewed AAROM/PROM of L UE due to hemiplegia and hypotonicity in shoulder with improvements in shoulder subluxation with shoulder shrug exercises .  MedBridge Exercises reviewed and practiced to facilitate return demonstration and modified positioning for progression of exercises including : - Seated Shoulder Shrugs - with good success at multiple repetitions.  - Seated Wrist Supination AA/PROM  - with patient encouraged to push forearm past neutral, hold  stretch 5+ seconds and turn hand upright as able.  - Supported Elbow Flexion/Extension AA/PROM  with patient  encouraged to isolate motions at elbow ie) minimize shoulder movements and push into MAX extension.  - Finger MP Flexion Extension  -  with patient encouraged her to work on full finger extension   - Seated Elbow Flexion Shoulder Internal Rotation AAROM at Table with Towel - with tactile cues to isolate elbow movements and minimize shoulder compensation.   Manual Therapy: Joint mobilizations to help improve L digital mobility and range of motion used to address stiffness in combination with soft tissue mobilization ie) physically stretching digits and wrist simultaneously to improve flexibility and restore mobility in joints with need for splint to maintain ROM.   RE: Orthotic: Patient is out of network for orthotist for resting hand splint and will still benefit from resting hand splint for contracture management of LUE.  Will explore options for pre fab vs custom for ROM  of L digits and wrist as prefab offers more comfort, ease of cleaning and adjustability.   PATIENT EDUCATION: Education details: Review of LUE self-ROM HEP, hand self-ROM Person educated: Patient Education method: Explanation, Demonstration, Tactile cues, Verbal cues, Handouts, and from previous session Education comprehension: verbalized understanding, returned demonstration, verbal cues required, tactile cues required, and needs further education  HOME EXERCISE PROGRAM: 02/11/23 - LUE stretch HEP  Date: 02/18/2023 Prepared by: Wyn Forster Reep 02/25/23: None 03/11/23: Access Code: [email or web address] Prepared by: Amada Kingfisher   GOALS: Goals reviewed with patient? Yes  SHORT TERM GOALS: Target date: 03/04/2023   Pt will be independent in LUE HEP. Baseline: Initiated Goal status: IN PROGRESS  2.  Pt will verbalize at least 2 adaptive equipment for completing ADLs. Baseline:  Goal status: IN PROGRESS  3. Pt will verbalize at least 2 memory compensation strategies. Baseline: Reports intermittent recall impairments Goal status: IN PROGRESS   LONG TERM GOALS: Target date: 03/25/2023   Pt will complete FOTO assessment at time of discharge scoring 39 or greater indicating functional progression with ADL and IADL completion. Baseline: score 33 Goal status: IN PROGRESS  2.  Pt will verbalize at least 3 compensatory methods for completing IADLs such as dish washing. Baseline:  Goal status: IN PROGRESS  3.  Pt will report completing UB bathing at minimal assistance level. Baseline: Mod assist Goal status: IN PROGRESS  4.  Pt will complete one simple meal with Modified independence utilizing DME/AE as needed. Baseline:  Goal status: IN PROGRESS 04/01/23 - Makes hot dogs on the stove, Microwaveable    5.  Pt will verbalize resource to obtain fabricated splint if needed. Baseline:  Goal status: IN PROGRESS   ASSESSMENT:  CLINICAL IMPRESSION: Pt patient has significant right hemiplegia with  shoulder hypotonicity and subluxation that may potentially benefit from hemi-sling but also tone in hand which will benefit from a resting hand splint.  She is out of network for orthotist therefore may fabricate a custom splint with additional padding.  A prefabricated resting hand splint to allow max comfort, ease of application as well as cleaning is ideal though.  Education continued for self-P/AAROM of L UE.  Patient continues to benefit from skilled OT services to address performance deficits as a result of L hemiplegia and maximize independence in activities of daily living.  PERFORMANCE DEFICITS: in functional skills including ADLs, IADLs, coordination, dexterity, sensation, tone, ROM, strength, flexibility, Fine motor control, Gross motor control, mobility, balance, body mechanics, endurance, and UE functional use, cognitive skills including  energy/drive and memory, and psychosocial skills including coping strategies and habits.   IMPAIRMENTS: are limiting patient from ADLs, IADLs, work, leisure, and social participation.   CO-MORBIDITIES: may have co-morbidities  that affects occupational performance. Patient will benefit from skilled OT to address above impairments and improve overall function.  REHAB POTENTIAL: Fair due to onset of CVA being 2021   PLAN:  OT FREQUENCY: 1x/week  OT DURATION: 6 weeks  PLANNED INTERVENTIONS: self care/ADL training, therapeutic exercise, therapeutic activity, neuromuscular re-education, manual therapy, passive range of motion, balance training, functional mobility training, splinting, electrical stimulation, paraffin, fluidotherapy, moist heat, cryotherapy, contrast bath, patient/family education, cognitive remediation/compensation, visual/perceptual remediation/compensation, psychosocial skills training, energy conservation, coping strategies training, DME and/or AE instructions, and Re-evaluation  RECOMMENDED OTHER SERVICES: Currently has PT  eval  CONSULTED AND AGREED WITH PLAN OF CARE: Patient and family member/caregiver  PLAN FOR NEXT SESSION:   Fabricate splint for L UE.  Renew POC.  Review/progression of HEPs.   Ensure good participation with one-handed ADLs techniques with ADLs and IADL compensatory methods   Victorino Sparrow, OT 04/01/2023, 12:18 PM

## 2023-04-15 ENCOUNTER — Ambulatory Visit: Payer: 59 | Admitting: Occupational Therapy

## 2023-04-15 DIAGNOSIS — M6281 Muscle weakness (generalized): Secondary | ICD-10-CM

## 2023-04-15 DIAGNOSIS — I69354 Hemiplegia and hemiparesis following cerebral infarction affecting left non-dominant side: Secondary | ICD-10-CM | POA: Diagnosis not present

## 2023-04-15 DIAGNOSIS — R278 Other lack of coordination: Secondary | ICD-10-CM

## 2023-04-15 DIAGNOSIS — M24542 Contracture, left hand: Secondary | ICD-10-CM

## 2023-04-15 NOTE — Therapy (Addendum)
OUTPATIENT OCCUPATIONAL THERAPY NEURO TREATMENT & PROGRESS NOTE  Patient Name: Ebony Matthews MRN: [id number] DOB:1981-07-25, 42 y.o., female Today's Date: 04/15/2023  PCP: Ellyn Hack, MD REFERRING PROVIDER: Drema Dallas, DO  END OF SESSION:  OT End of Session - 04/15/23 0949     Visit Number 7    Number of Visits 12    Authorization Type [employer] Medicare primary    OT Start Time [phone]    OT Stop Time 1015    OT Time Calculation (min) 25 min    Equipment Utilized During Treatment --    Activity Tolerance Patient tolerated treatment well    Behavior During Therapy [facility] for tasks assessed/performed             Past Medical History:  Diagnosis Date   Diabetes mellitus without complication (HCC)    Hyperlipidemia    Hypertension    Loop recorder [employer] 12/14/2020 12/14/2020   Scheduled Remote loop recorder check 12/14/2020: NSR. New implant.    Stroke [facility])    TIA (transient ischemic attack) 01/22/2019   Past Surgical History:  Procedure Laterality Date   NO PAST SURGERIES     Patient Active Problem List   Diagnosis Date Noted   Pain due to onychomycosis of toenails of both feet 10/24/2021   Blood clotting disorder (HCC) 10/24/2021   Loop recorder [employer] 12/14/2020 12/14/2020   Uncontrolled type 2 diabetes mellitus with hyperglycemia, without long-term current use of insulin (HCC) 04/16/2020   CVA (cerebral vascular accident) (HCC) 04/15/2020   Cocaine use 04/15/2020   Acute CVA (cerebrovascular accident) (HCC) 03/04/2020   Stroke (cerebrum) (HCC) 03/03/2020   Weakness    Noncompliance with medication regimen    TIA (transient ischemic attack) 01/22/2019   Diabetes mellitus due to underlying condition without complications (HCC) 05/30/2014   Essential hypertension, benign 05/30/2014   Smoking 05/30/2014    ONSET DATE: 01/14/2023 (referral date)  REFERRING DIAG: G81.90 (ICD-10-CM) - Hemiparesis, unspecified hemiparesis etiology, unspecified laterality  (HCC) I63.9 (ICD-10-CM) - Cerebrovascular accident (CVA), unspecified mechanism (HCC)  THERAPY DIAG:  Muscle weakness (generalized)  Other lack of coordination  Hemiplegia and hemiparesis following cerebral infarction affecting left non-dominant side (HCC)  Contracture of joint of left hand  Rationale for Evaluation and Treatment: Rehabilitation  SUBJECTIVE:   SUBJECTIVE STATEMENT: Pt reports going by Ebony Matthews.   Pt arrived late for session today and was informed that her Medicare was not traditional and still out of network for DME/orthotist, so she was in agreement with making a splint at next session when we have full time for process.  Pt accompanied by: self  PERTINENT HISTORY: History of multiple strokes with residual deficits, right ICA occlusion, hypertension, diabetes, history of cocaine use, former smoker  PRECAUTIONS: Other: Loop implant to monitor for atrial fibrillation  WEIGHT BEARING RESTRICTIONS: No  PAIN:  Are you having pain? No  FALLS: Has patient fallen in last 6 months? No  LIVING ENVIRONMENT: Lives with: lives alone Lives in: House/apartment - 1st floor apartment Stairs: No Has following equipment at home: Quad cane large base, Hemi walker, Shower bench, bed side commode, and Grab bars Bathroom setup: Tub/shower combo with grab bars, standard height toilet with bedside commode frame over top that has arm rests  PLOF:  Pt varies between independence and needing assistance pending on the task, likes to go get nails done, aunt drives patient  PATIENT GOALS: Use LUE better to do daily tasks such as washing dishes, bathing, and cooking  OBJECTIVE:   HAND DOMINANCE: Right  ADLs: Eating: Independent Grooming: Independent brushing teeth and washing face, aunt helps with putting on deodorant UB Dressing: Pt can get a "big shirt on and off", assist with bra and jacket LB Dressing: Independent Toileting: Independent Bathing: Independent LB, assist with  UB Tub Shower transfers: Utilizes tub bench, sits down to shower Equipment: Transfer tub bench, Grab bars, bed side commode, and Reacher  IADLs: Shopping: Aunt performs grocery shopping, leisurely walks through [place], but not for shopping specifically Light housekeeping: Puts away dishes, aunt does more heavy household chores Meal Prep: Aunt makes her meals Community mobility: Utilizes quad cane at all times to include household mobility Medication management: Easy-open pill box Financial management: Independent Handwriting: 100% legible and Mild micrographia - pt reports baseline  MOBILITY STATUS: Independent  POSTURE COMMENTS:  No Significant postural limitations Sitting balance: Supports self with one extremity  ACTIVITY TOLERANCE: Activity tolerance: Pt reports no limitations with distance from cardiovascular standpoint, but muscles get fatigued  FUNCTIONAL OUTCOME MEASURES: FOTO: 33    UPPER EXTREMITY ROM:    RUE - AROM WNL LUE - PROM WFL, pt attempts to perform active shoulder flexion achieving approx 10-15 degrees (PROM up to 90*), no active elbow, wrist, or digit ROM  UPPER EXTREMITY MMT:     RUE - MMT WNL LUE - shoulder 2-/5, elbow 0/5, wrist 0/5, digits 0/5  HAND FUNCTION: Pt unable to move left hand at all  COORDINATION: RUE - no deficits; LUE - unable to test due to inability to actively move extremity  SENSATION: Light touch: Impaired - dull sensation LUE when compared to RUE  EDEMA: None  MUSCLE TONE: RUE: Within functional limits and LUE: Modifed Ashworth Scale 1+ = Slight increase in muscle tone, manifested by a catch, followed by minimal resistance throughout the remainder (less than half) of the ROM  COGNITION: Overall cognitive status:  Pt reports some difficulty with recall  VISION: Subjective report: No changes from CVA Baseline vision: Wears glasses all the time Visual history:  None  VISION ASSESSMENT: WFL  Patient has difficulty  with following activities due to following visual impairments: N/A  PERCEPTION: WFL  PRAXIS: WFL  Evaluation OBSERVATIONS: Pt with difficulty transferring from chair in lobby to standing with quad cane. Pt ambulating back to treatment clinic utilizing quad cane in RUE with minimal to no natural arm swing of LUE, compensatory walking pattern with left hip hike with swing, slower gait. Aunt is present with patient carrying other times.   TODAY'S TREATMENT:                                                                                                                               Neuromuscular Reeducation: Neuro re-education completed to help improve motor control of LUE s/p hemiplegia from CVA.  Pt engaged in performing LUE motions to improve motor control of L hand and arm.   Grasp and movement of L UE  facilitated with different containers at different heights ie) to simulate eating from a small container held in L hand.  She is guided to work on lifting up item from her lap to table top, as well as moving an object across table top - back and forth.  She is able to squeeze her hand to hold an empty can and worked on pulling her L arm towards herself  to simulate a container of food.  She can push the arm back across the tabletop away from her body ie) extending elbow and pushing shoulder forward short distances.   Patient requiring verbal, visual and tactile cues for neuromuscular re-education to ensure proper form/movement. She was guided to move her arm on a towel/washcloth to increased ease of sliding across the table top.  Tactile input and cues provided to her L shoulder during ROM to facilitate functional movement.   Pt education provided for how movements translate to ADLs - especially holding an object for self feeding. Pt receptive and accepting to hands-on guidance, visual feedback and verbal cues to improve LUE motions.     PATIENT EDUCATION: Education details: L UE A/AROM   Person educated: Patient Education method: Explanation, Demonstration, Tactile cues, and Verbal cues Education comprehension: verbalized understanding, returned demonstration, verbal cues required, tactile cues required, and needs further education  HOME EXERCISE PROGRAM: 02/11/23 - LUE stretch HEP  Date: 02/18/2023 Prepared by: Wyn Forster Reep 02/25/23: None 03/11/23: Access Code: [email or web address] Prepared by: Amada Kingfisher   GOALS: Goals reviewed with patient? Yes  SHORT TERM GOALS: Target date: 03/04/2023   Pt will be independent in LUE HEP. Baseline: Initiated Goal status: IN PROGRESS  2.  Pt will verbalize at least 2 adaptive equipment for completing ADLs. Baseline:  Goal status: IN PROGRESS 04/01/23 - Patient asked for can opener handout again.  3. Pt will verbalize at least 2 memory compensation strategies. Baseline: Reports intermittent recall impairments Goal status: IN PROGRESS   LONG TERM GOALS: Target date: 03/25/2023   Pt will complete FOTO assessment at time of discharge scoring 39 or greater indicating functional progression with ADL and IADL completion. Baseline: score 33 Goal status: IN PROGRESS  2.  Pt will verbalize at least 3 compensatory methods for completing IADLs such as dish washing. Baseline:  Goal status: IN PROGRESS  3.  Pt will report completing UB bathing at minimal assistance level. Baseline: Mod assist Goal status: IN PROGRESS  4.  Pt will complete one simple meal with Modified independence utilizing DME/AE as needed. Baseline:  Goal status: IN PROGRESS 04/01/23 - Makes hot dogs on the stove, Microwaveable    5.  Pt will verbalize resource to obtain fabricated splint if needed. Baseline:  Goal status: IN PROGRESS   ASSESSMENT:  CLINICAL IMPRESSION: Pt patient has improvements in left UE AROM compared to NA at eval ie) able to grasp item today, slide container across table top and move it from her lap to table top. She will benefit from a resting  hand splint & plans are made to fabricate a custom thermoplastic splint d/t prefabricated resting hand splint being out of network for her insurance.  Education continues for self-P/AAROM of L UE with handouts neededfor carryover on daily basis.  Patient continues to benefit from skilled OT services to address performance deficits as a result of L hemiplegia and maximize independence in activities of daily living.     PERFORMANCE DEFICITS: in functional skills including ADLs, IADLs, coordination, dexterity, sensation, tone, ROM, strength, flexibility, Fine motor control,  Gross motor control, mobility, balance, body mechanics, endurance, and UE functional use, cognitive skills including energy/drive and memory, and psychosocial skills including coping strategies and habits.   IMPAIRMENTS: are limiting patient from ADLs, IADLs, work, leisure, and social participation.   CO-MORBIDITIES: may have co-morbidities  that affects occupational performance. Patient will benefit from skilled OT to address above impairments and improve overall function.  REHAB POTENTIAL: Fair due to onset of CVA being 2021   PLAN:  OT FREQUENCY: 1x/week  OT DURATION: 6 weeks (UPOC 04/01/23)  PLANNED INTERVENTIONS: self care/ADL training, therapeutic exercise, therapeutic activity, neuromuscular re-education, manual therapy, passive range of motion, balance training, functional mobility training, splinting, electrical stimulation, paraffin, fluidotherapy, moist heat, cryotherapy, contrast bath, patient/family education, cognitive remediation/compensation, visual/perceptual remediation/compensation, psychosocial skills training, energy conservation, coping strategies training, DME and/or AE instructions, and Re-evaluation  RECOMMENDED OTHER SERVICES: Currently has PT eval  CONSULTED AND AGREED WITH PLAN OF CARE: Patient and family member/caregiver  PLAN FOR NEXT SESSION:   Fabricate splint for L UE.  Review and update  HEPs with handuts needed  Ensure good participation and awareness of AE for one-handed ADLs techniques with ADLs and IADL compensatory methods.  OT POC extended last visit ie) to continue 1x/week x 6 weeks.   Victorino Sparrow, OT 04/15/2023, 5:02 PM

## 2023-04-22 ENCOUNTER — Ambulatory Visit: Payer: 59 | Admitting: Occupational Therapy

## 2023-04-22 DIAGNOSIS — M24542 Contracture, left hand: Secondary | ICD-10-CM

## 2023-04-22 DIAGNOSIS — G8114 Spastic hemiplegia affecting left nondominant side: Secondary | ICD-10-CM

## 2023-04-22 DIAGNOSIS — I69354 Hemiplegia and hemiparesis following cerebral infarction affecting left non-dominant side: Secondary | ICD-10-CM | POA: Diagnosis not present

## 2023-04-22 NOTE — Therapy (Signed)
OUTPATIENT OCCUPATIONAL THERAPY NEURO TREATMENT  Patient Name: Ebony Matthews MRN: [id number] DOB:08/29/1981, 42 y.o., female Today's Date: 04/22/2023  PCP: Ellyn Hack, MD REFERRING PROVIDER: Drema Dallas, DO  END OF SESSION:  OT End of Session - 04/22/23 0930     Visit Number 8    Number of Visits 12    Date for OT Re-Evaluation 05/22/23    Authorization Type [employer] Medicare primary    OT Start Time 0932    OT Stop Time 1015    OT Time Calculation (min) 43 min    Equipment Utilized During Treatment Custom splint fabricated.    Activity Tolerance Patient tolerated treatment well    Behavior During Therapy [facility] for tasks assessed/performed             Past Medical History:  Diagnosis Date   Diabetes mellitus without complication (HCC)    Hyperlipidemia    Hypertension    Loop recorder [employer] 12/14/2020 12/14/2020   Scheduled Remote loop recorder check 12/14/2020: NSR. New implant.    Stroke [facility])    TIA (transient ischemic attack) 01/22/2019   Past Surgical History:  Procedure Laterality Date   NO PAST SURGERIES     Patient Active Problem List   Diagnosis Date Noted   Pain due to onychomycosis of toenails of both feet 10/24/2021   Blood clotting disorder (HCC) 10/24/2021   Loop recorder [employer] 12/14/2020 12/14/2020   Uncontrolled type 2 diabetes mellitus with hyperglycemia, without long-term current use of insulin (HCC) 04/16/2020   CVA (cerebral vascular accident) (HCC) 04/15/2020   Cocaine use 04/15/2020   Acute CVA (cerebrovascular accident) (HCC) 03/04/2020   Stroke (cerebrum) (HCC) 03/03/2020   Weakness    Noncompliance with medication regimen    TIA (transient ischemic attack) 01/22/2019   Diabetes mellitus due to underlying condition without complications (HCC) 05/30/2014   Essential hypertension, benign 05/30/2014   Smoking 05/30/2014    ONSET DATE: 01/14/2023 (referral date)  REFERRING DIAG: G81.90 (ICD-10-CM) - Hemiparesis,  unspecified hemiparesis etiology, unspecified laterality (HCC) I63.9 (ICD-10-CM) - Cerebrovascular accident (CVA), unspecified mechanism (HCC)  THERAPY DIAG:  Contracture of joint of left hand  Spastic hemiplegia affecting left nondominant side, unspecified etiology (HCC)  Rationale for Evaluation and Treatment: Rehabilitation  SUBJECTIVE:   SUBJECTIVE STATEMENT: Pt reports going by [name].   Pt arrived with her aunt today on time for splint fabrication and fitting.  Pt accompanied by: self & aunt  PERTINENT HISTORY: History of multiple strokes with residual deficits, right ICA occlusion, hypertension, diabetes, history of cocaine use, former smoker  PRECAUTIONS: Other: Loop implant to monitor for atrial fibrillation  WEIGHT BEARING RESTRICTIONS: No  PAIN:  Are you having pain? No  FALLS: Has patient fallen in last 6 months? No  LIVING ENVIRONMENT: Lives with: lives alone Lives in: House/apartment - 1st floor apartment Stairs: No Has following equipment at home: Quad cane large base, Hemi walker, Shower bench, bed side commode, and Grab bars Bathroom setup: Tub/shower combo with grab bars, standard height toilet with bedside commode frame over top that has arm rests  PLOF:  Pt varies between independence and needing assistance pending on the task, likes to go get nails done, aunt drives patient  PATIENT GOALS: Use LUE better to do daily tasks such as washing dishes, bathing, and cooking  OBJECTIVE:   HAND DOMINANCE: Right  ADLs: Eating: Independent Grooming: Independent brushing teeth and washing face, aunt helps with putting on deodorant UB Dressing: Pt can get a "big  shirt on and off", assist with bra and jacket LB Dressing: Independent Toileting: Independent Bathing: Independent LB, assist with UB Tub Shower transfers: Utilizes tub bench, sits down to shower Equipment: Transfer tub bench, Grab bars, bed side commode, and Reacher  IADLs: Shopping: Aunt  performs grocery shopping, leisurely walks through [place], but not for shopping specifically Light housekeeping: Puts away dishes, aunt does more heavy household chores Meal Prep: Aunt makes her meals Community mobility: Utilizes quad cane at all times to include household mobility Medication management: Easy-open pill box Financial management: Independent Handwriting: 100% legible and Mild micrographia - pt reports baseline  MOBILITY STATUS: Independent  POSTURE COMMENTS:  No Significant postural limitations Sitting balance: Supports self with one extremity  ACTIVITY TOLERANCE: Activity tolerance: Pt reports no limitations with distance from cardiovascular standpoint, but muscles get fatigued  FUNCTIONAL OUTCOME MEASURES: FOTO: 33    UPPER EXTREMITY ROM:    RUE - AROM WNL LUE - PROM WFL, pt attempts to perform active shoulder flexion achieving approx 10-15 degrees (PROM up to 90*), no active elbow, wrist, or digit ROM  UPPER EXTREMITY MMT:     RUE - MMT WNL LUE - shoulder 2-/5, elbow 0/5, wrist 0/5, digits 0/5  HAND FUNCTION: Pt unable to move left hand at all  COORDINATION: RUE - no deficits; LUE - unable to test due to inability to actively move extremity  SENSATION: Light touch: Impaired - dull sensation LUE when compared to RUE  EDEMA: None  MUSCLE TONE: RUE: Within functional limits and LUE: Modifed Ashworth Scale 1+ = Slight increase in muscle tone, manifested by a catch, followed by minimal resistance throughout the remainder (less than half) of the ROM  COGNITION: Overall cognitive status:  Pt reports some difficulty with recall  VISION: Subjective report: No changes from CVA Baseline vision: Wears glasses all the time Visual history:  None  VISION ASSESSMENT: WFL  Patient has difficulty with following activities due to following visual impairments: N/A  PERCEPTION: WFL  PRAXIS: WFL  Evaluation OBSERVATIONS: Pt with difficulty transferring from  chair in lobby to standing with quad cane. Pt ambulating back to treatment clinic utilizing quad cane in RUE with minimal to no natural arm swing of LUE, compensatory walking pattern with left hip hike with swing, slower gait. Aunt is present with patient carrying other times.   TODAY'S TREATMENT:                                                                                                                               Orthotic Fabrication/Management:  Therapist fabricated resting hand splint for improved positioning, management of hypertonicity, skin integrity, and joint integrity of affected left extremity following CVA. Left hand was placed with neutral position of wrist and digits, and thumb in slight palmar abduction. Straps applied to forearm, wrist, digit and thumb with good position achieved of hand, wrist and thumb ie) no hyperextension, good neutral position and straps help fingers well  extended.    Wearing instructions discussed ie) increasing wear time gradually, checking skin for redness, irritation and pressure and working up to 4-6 hours for potential overnight use and/or daytime use as desired.  Will review splint comfort and wearing instructions during next session for any signs of skin breakdown will be monitored during future sessions to assure appropriate fit.    PATIENT EDUCATION: Education details: splinting recommendations  Person educated: Patient & aunt Education method: Explanation, Demonstration, Tactile cues, and Verbal cues Education comprehension: verbalized understanding, returned demonstration, verbal cues required, tactile cues required, and needs further education  HOME EXERCISE PROGRAM: 02/11/23 - LUE stretch HEP  Date: 02/18/2023 Prepared by: Wyn Forster Reep 02/25/23: None 03/11/23: Access Code: [email or web address] Prepared by: Amada Kingfisher   GOALS: Goals reviewed with patient? Yes  SHORT TERM GOALS: Target date: 03/04/2023   Pt will be independent in LUE  HEP. Baseline: Initiated Goal status: IN PROGRESS  2.  Pt will verbalize at least 2 adaptive equipment for completing ADLs. Baseline:  Goal status: IN PROGRESS 04/01/23 - Patient asked for can opener handout again.  3. Pt will verbalize at least 2 memory compensation strategies. Baseline: Reports intermittent recall impairments Goal status: IN PROGRESS   LONG TERM GOALS: Target date: 03/25/2023   Pt will complete FOTO assessment at time of discharge scoring 39 or greater indicating functional progression with ADL and IADL completion. Baseline: score 33 Goal status: IN PROGRESS  2.  Pt will verbalize at least 3 compensatory methods for completing IADLs such as dish washing. Baseline:  Goal status: IN PROGRESS  3.  Pt will report completing UB bathing at minimal assistance level. Baseline: Mod assist Goal status: IN PROGRESS  4.  Pt will complete one simple meal with Modified independence utilizing DME/AE as needed. Baseline:  Goal status: IN PROGRESS 04/01/23 - Makes hot dogs on the stove, Microwaveable    5.  Pt/caregiver will demonstrate appropriate wear schedule of newly fabricated splint ifor max ROM and comfort of L UE to minimize progression of contractures.. Baseline: Goal modified/revised 04/22/23 Goal status: IN PROGRESS   ASSESSMENT:  CLINICAL IMPRESSION: Pt tolerate L UE splint fabrication well today for contracture management.  Her aunt was present for duration of session and able to observe application of splint/straps for max stretch of contracted digits/wrist.  Both patient and aunt educated in gradual increase in wear time for resting hand splint.  Patient will continue to benefit from skilled OT services to ensure proper carryover of splint application and progress performance deficits as a result of L hemiplegia and maximize independence in activities of daily living.     PERFORMANCE DEFICITS: in functional skills including ADLs, IADLs, coordination, dexterity,  sensation, tone, ROM, strength, flexibility, Fine motor control, Gross motor control, mobility, balance, body mechanics, endurance, and UE functional use, cognitive skills including energy/drive and memory, and psychosocial skills including coping strategies and habits.   IMPAIRMENTS: are limiting patient from ADLs, IADLs, work, leisure, and social participation.   CO-MORBIDITIES: may have co-morbidities  that affects occupational performance. Patient will benefit from skilled OT to address above impairments and improve overall function.  REHAB POTENTIAL: Fair due to onset of CVA being 2021   PLAN:  OT FREQUENCY: 1x/week  OT DURATION: 6 weeks (UPOC 04/01/23)  PLANNED INTERVENTIONS: self care/ADL training, therapeutic exercise, therapeutic activity, neuromuscular re-education, manual therapy, passive range of motion, balance training, functional mobility training, splinting, electrical stimulation, paraffin, fluidotherapy, moist heat, cryotherapy, contrast bath, patient/family education, cognitive remediation/compensation, visual/perceptual remediation/compensation, psychosocial skills  training, energy conservation, coping strategies training, DME and/or AE instructions, and Re-evaluation  RECOMMENDED OTHER SERVICES: Currently has PT eval  CONSULTED AND AGREED WITH PLAN OF CARE: Patient and family member/caregiver  PLAN FOR NEXT SESSION:   Check on newly Fabricated splint for L UE.  Review and update HEPs with handouts needed  Ensure good participation and awareness of AE for one-handed ADLs techniques with ADLs and IADL compensatory methods.   Victorino Sparrow, OT 04/22/2023, 5:22 PM

## 2023-04-29 ENCOUNTER — Ambulatory Visit: Payer: 59 | Admitting: Occupational Therapy

## 2023-04-29 DIAGNOSIS — M24542 Contracture, left hand: Secondary | ICD-10-CM

## 2023-04-29 DIAGNOSIS — R278 Other lack of coordination: Secondary | ICD-10-CM

## 2023-04-29 DIAGNOSIS — M6281 Muscle weakness (generalized): Secondary | ICD-10-CM

## 2023-04-29 DIAGNOSIS — G8114 Spastic hemiplegia affecting left nondominant side: Secondary | ICD-10-CM

## 2023-04-29 DIAGNOSIS — I69354 Hemiplegia and hemiparesis following cerebral infarction affecting left non-dominant side: Secondary | ICD-10-CM | POA: Diagnosis not present

## 2023-04-29 NOTE — Therapy (Signed)
OUTPATIENT OCCUPATIONAL THERAPY NEURO TREATMENT  Patient Name: Ebony Matthews MRN: [id number] DOB:[birth year], 42 y.o., female Today's Date: 04/29/2023  PCP: Ellyn Hack, MD REFERRING PROVIDER: Drema Dallas, DO  END OF SESSION:  OT End of Session - 04/29/23 1100     Visit Number 9    Number of Visits 12    Date for OT Re-Evaluation 05/22/23    Authorization Type [employer] Medicare primary    OT Start Time 1030    OT Stop Time 1115    OT Time Calculation (min) 45 min    Equipment Utilized During Treatment Custom splint modified    Activity Tolerance Patient tolerated treatment well    Behavior During Therapy [facility] for tasks assessed/performed             Past Medical History:  Diagnosis Date   Diabetes mellitus without complication (HCC)    Hyperlipidemia    Hypertension    Loop recorder [employer] 12/14/2020 12/14/2020   Scheduled Remote loop recorder check 12/14/2020: NSR. New implant.    Stroke [facility])    TIA (transient ischemic attack) 01/22/2019   Past Surgical History:  Procedure Laterality Date   NO PAST SURGERIES     Patient Active Problem List   Diagnosis Date Noted   Pain due to onychomycosis of toenails of both feet 10/24/2021   Blood clotting disorder (HCC) 10/24/2021   Loop recorder [employer] 12/14/2020 12/14/2020   Uncontrolled type 2 diabetes mellitus with hyperglycemia, without long-term current use of insulin (HCC) 04/16/2020   CVA (cerebral vascular accident) (HCC) 04/15/2020   Cocaine use 04/15/2020   Acute CVA (cerebrovascular accident) (HCC) 03/04/2020   Stroke (cerebrum) (HCC) 03/03/2020   Weakness    Noncompliance with medication regimen    TIA (transient ischemic attack) 01/22/2019   Diabetes mellitus due to underlying condition without complications (HCC) 05/30/2014   Essential hypertension, benign 05/30/2014   Smoking 05/30/2014    ONSET DATE: 01/14/2023 (referral date)  REFERRING DIAG: G81.90 (ICD-10-CM) - Hemiparesis, unspecified  hemiparesis etiology, unspecified laterality (HCC) I63.9 (ICD-10-CM) - Cerebrovascular accident (CVA), unspecified mechanism (HCC)  THERAPY DIAG:  Contracture of joint of left hand  Other lack of coordination  Muscle weakness (generalized)  Spastic hemiplegia affecting left nondominant side, unspecified etiology (HCC)  Rationale for Evaluation and Treatment: Rehabilitation  SUBJECTIVE:   SUBJECTIVE STATEMENT: Pt reports going by [name].   Pt arrived with her mom today with her splint on and reporting that it was getting caught on her clothes (ie. the Velcro).  She did report wearing it overnight and for prolonged durations since fabrication last week.  Today was her 3rd week of a weekly Vitamin D2 that was added to her medication list upon review of meds today.  Pt accompanied by: self & [name]  PERTINENT HISTORY: History of multiple strokes with residual deficits, right ICA occlusion, hypertension, diabetes, history of cocaine use, former smoker  PRECAUTIONS: Other: Loop implant to monitor for atrial fibrillation  WEIGHT BEARING RESTRICTIONS: No  PAIN:  Are you having pain? No  FALLS: Has patient fallen in last 6 months? No  LIVING ENVIRONMENT: Lives with: lives alone Lives in: House/apartment - 1st floor apartment Stairs: No Has following equipment at home: Quad cane large base, Hemi walker, Shower bench, bed side commode, and Grab bars Bathroom setup: Tub/shower combo with grab bars, standard height toilet with bedside commode frame over top that has arm rests  PLOF:  Pt varies between independence and needing assistance pending on  the task, likes to go get nails done, aunt drives patient  PATIENT GOALS: Use LUE better to do daily tasks such as washing dishes, bathing, and cooking  OBJECTIVE:   HAND DOMINANCE: Right  ADLs: Eating: Independent Grooming: Independent brushing teeth and washing face, aunt helps with putting on deodorant UB Dressing: Pt can  get a "big shirt on and off", assist with bra and jacket LB Dressing: Independent Toileting: Independent Bathing: Independent LB, assist with UB Tub Shower transfers: Utilizes tub bench, sits down to shower Equipment: Transfer tub bench, Grab bars, bed side commode, and Reacher  IADLs: Shopping: Aunt performs grocery shopping, leisurely walks through [place], but not for shopping specifically Light housekeeping: Puts away dishes, aunt does more heavy household chores Meal Prep: Aunt makes her meals Community mobility: Utilizes quad cane at all times to include household mobility Medication management: Easy-open pill box Financial management: Independent Handwriting: 100% legible and Mild micrographia - pt reports baseline  MOBILITY STATUS: Independent  POSTURE COMMENTS:  No Significant postural limitations Sitting balance: Supports self with one extremity  ACTIVITY TOLERANCE: Activity tolerance: Pt reports no limitations with distance from cardiovascular standpoint, but muscles get fatigued  FUNCTIONAL OUTCOME MEASURES: FOTO: 33    UPPER EXTREMITY ROM:    RUE - AROM WNL LUE - PROM WFL, pt attempts to perform active shoulder flexion achieving approx 10-15 degrees (PROM up to 90*), no active elbow, wrist, or digit ROM  UPPER EXTREMITY MMT:     RUE - MMT WNL LUE - shoulder 2-/5, elbow 0/5, wrist 0/5, digits 0/5  HAND FUNCTION: Pt unable to move left hand at all  COORDINATION: RUE - no deficits; LUE - unable to test due to inability to actively move extremity  SENSATION: Light touch: Impaired - dull sensation LUE when compared to RUE  EDEMA: None  MUSCLE TONE: RUE: Within functional limits and LUE: Modifed Ashworth Scale 1+ = Slight increase in muscle tone, manifested by a catch, followed by minimal resistance throughout the remainder (less than half) of the ROM  COGNITION: Overall cognitive status:  Pt reports some difficulty with recall  VISION: Subjective  report: No changes from CVA Baseline vision: Wears glasses all the time Visual history:  None  VISION ASSESSMENT: WFL  Patient has difficulty with following activities due to following visual impairments: N/A  PERCEPTION: WFL  PRAXIS: WFL  Evaluation OBSERVATIONS: Pt with difficulty transferring from chair in lobby to standing with quad cane. Pt ambulating back to treatment clinic utilizing quad cane in RUE with minimal to no natural arm swing of LUE, compensatory walking pattern with left hip hike with swing, slower gait. Aunt is present with patient carrying other times.   TODAY'S TREATMENT:                                                                                                                               Orthotic Fabrication/Management:  Patient wore the custom resting hand splint fabricated  last week and reproted that it gets caught on her clothes.  OTR observed loose fit around her MCP joints and modifications made to strapping to improve positioning, management of hypertonicity, and joint integrity of LUE following CVA.   Straps were re-cut from Velofoam verus Secureloop as at least 1 strap had peeled apart.  Velcro hook pieces were shortened to avoid tendency to catch on her clothing.  Straps trialled across fingers at [facility] and additional strap across MCP joints.  Left hand was in a more neutral position with strap across MCP joints and thicker straps were cut in half to go from radial edge of finger pan to ulnar side. Straps applied to forearm, wrist, digit and thumb with good position achieved of hand, wrist and thumb ie) no hyperextension, good neutral position and straps help fingers well extended.    Wearing instructions discussed ie) checking skin for redness, irritation and pressure and continuing overnight use and/or daytime use as desired. Also reviewed cleaning instructions ie) use of wet wipe  Will review splint fit again during next session to ensure fingers stay  in place with appropriate fit.    Therapeutic Exercises:  Reprinted HEP and reviewed each exercise with patient and mother with addition of images for finger stretches and gentle gripping activities.  Small picture list is also provided and patient is encourage to put small picture at different place in the home to encourage herself to work on exercised throughout the day.  - Seated Shoulder Shrugs  - good exercise to work on at [employer], commercial - Seated Wrist Supination PROM  & Supported Elbow Flexion Extension PROM  - good exercises to do at bedside  Table top exercises - Seated Elbow Flexion Shoulder Internal Rotation AAROM at Table with Towel   - Finger MP Flexion Extension    Simple activities to work on while watching TV - Gripping Sponge Pronated   - Hand PROM Finger Extension    PATIENT EDUCATION: Education details: splint cleaning and HEP review/update Person educated: Patient & mother Education method: Explanation, Demonstration, Tactile cues, Verbal cues, and Handouts Education comprehension: verbalized understanding, returned demonstration, verbal cues required, tactile cues required, and needs further education  HOME EXERCISE PROGRAM: 02/11/23 - LUE stretch HEP  Date: 02/18/2023 Prepared by: Wyn Forster Reep 02/25/23: None 03/11/23: Access Code: [email or web address] Prepared by: Amada Kingfisher 04/29/23: Above access code reprinted and updated for UE PROM/AAROM   GOALS: Goals reviewed with patient? Yes  SHORT TERM GOALS: Target date: 03/04/2023   Pt will be independent in LUE HEP. Baseline: Initiated Goal status: IN PROGRESS  2.  Pt will verbalize at least 2 adaptive equipment for completing ADLs. Baseline:  Goal status: IN PROGRESS 04/01/23 - Patient asked for can opener handout again.  3. Pt will verbalize at least 2 memory compensation strategies. Baseline: Reports intermittent recall impairments Goal status: IN PROGRESS   LONG TERM GOALS: Target date: 03/25/2023   Pt will  complete FOTO assessment at time of discharge scoring 39 or greater indicating functional progression with ADL and IADL completion. Baseline: score 33 Goal status: IN PROGRESS  2.  Pt will verbalize at least 3 compensatory methods for completing IADLs such as dish washing. Baseline:  Goal status: IN PROGRESS  3.  Pt will report completing UB bathing at minimal assistance level. Baseline: Mod assist Goal status: IN PROGRESS  4.  Pt will complete one simple meal with Modified independence utilizing DME/AE as needed. Baseline:  Goal status: IN PROGRESS 04/01/23 - Makes hot dogs  on the stove, Microwaveable    5.  Pt/caregiver will demonstrate appropriate wear schedule of newly fabricated splint ifor max ROM and comfort of L UE to minimize progression of contractures.. Baseline: Goal modified/revised 04/22/23 Goal status: IN PROGRESS   ASSESSMENT:  CLINICAL IMPRESSION: Pt tolerated L UE custom splint well this past week for contracture management even being able to wear it overnight several days. Her mother was present for duration of session and able to observe modifications to straps and was provided extra straps in case she needed to adjust splint/straps for max stretch of contracted digits/wrist.  Both patient and mother educated in checking skin s/p use of resting hand splint and HEP ideas for LUE.  Patient will continue to benefit from skilled OT services to ensure proper carryover of splint application and progress performance deficits as a result of L hemiplegia and maximize independence in activities of daily living.     PERFORMANCE DEFICITS: in functional skills including ADLs, IADLs, coordination, dexterity, sensation, tone, ROM, strength, flexibility, Fine motor control, Gross motor control, mobility, balance, body mechanics, endurance, and UE functional use, cognitive skills including energy/drive and memory, and psychosocial skills including coping strategies and habits.    IMPAIRMENTS: are limiting patient from ADLs, IADLs, work, leisure, and social participation.   CO-MORBIDITIES: may have co-morbidities  that affects occupational performance. Patient will benefit from skilled OT to address above impairments and improve overall function.  REHAB POTENTIAL: Fair due to onset of CVA being 2021   PLAN:  OT FREQUENCY: 1x/week  OT DURATION: 6 weeks (UPOC 04/01/23)  PLANNED INTERVENTIONS: self care/ADL training, therapeutic exercise, therapeutic activity, neuromuscular re-education, manual therapy, passive range of motion, balance training, functional mobility training, splinting, electrical stimulation, paraffin, fluidotherapy, moist heat, cryotherapy, contrast bath, patient/family education, cognitive remediation/compensation, visual/perceptual remediation/compensation, psychosocial skills training, energy conservation, coping strategies training, DME and/or AE instructions, and Re-evaluation  RECOMMENDED OTHER SERVICES: Currently has PT eval  CONSULTED AND AGREED WITH PLAN OF CARE: Patient and family member/caregiver  PLAN FOR NEXT SESSION:   Check on straps on new custom splint for L UE.  Review and update HEPs with handouts as needed  Ensure good participation and awareness of AE for one-handed ADLs techniques with ADLs and IADL compensatory methods especially r/t PSFS activities - wash dishes, bathe and meal prep   Victorino Sparrow, OT 04/29/2023, 11:45 AM

## 2023-04-29 NOTE — Patient Instructions (Signed)
Reprinted and updated HEP for L UE with demonstration with mother present  Access Code: [email or web address] URL: [email or web address] Date: 04/29/2023 Prepared by: Amada Kingfisher  Exercises - Seated Shoulder Shrugs  - 5 x daily - 5-10 reps - Seated Wrist Supination PROM  - 1 x daily - 2-3 sets - 10 reps - Supported Elbow Flexion Extension PROM  - 1 x daily - 2 sets - 10 reps - Finger MP Flexion Extension  - 1 x daily - 2 sets - 10 reps - Seated Elbow Flexion Shoulder Internal Rotation AAROM at Table with Towel  - 1 x daily - 2 sets - 10 reps - Hand PROM Finger Extension  - 2 x daily - 10 reps - Gripping Sponge Pronated  - 2 x daily - 10 reps

## 2023-05-06 ENCOUNTER — Ambulatory Visit: Payer: 59 | Attending: Neurology | Admitting: Occupational Therapy

## 2023-05-06 DIAGNOSIS — I69354 Hemiplegia and hemiparesis following cerebral infarction affecting left non-dominant side: Secondary | ICD-10-CM | POA: Insufficient documentation

## 2023-05-06 DIAGNOSIS — R278 Other lack of coordination: Secondary | ICD-10-CM | POA: Insufficient documentation

## 2023-05-06 DIAGNOSIS — M24542 Contracture, left hand: Secondary | ICD-10-CM | POA: Diagnosis present

## 2023-05-06 DIAGNOSIS — R29898 Other symptoms and signs involving the musculoskeletal system: Secondary | ICD-10-CM | POA: Insufficient documentation

## 2023-05-06 DIAGNOSIS — M6281 Muscle weakness (generalized): Secondary | ICD-10-CM | POA: Insufficient documentation

## 2023-05-06 NOTE — Therapy (Signed)
OUTPATIENT OCCUPATIONAL THERAPY NEURO TREATMENT  Patient Name: Ebony Matthews MRN: [id number] DOB:Nov 01, 1980, 42 y.o., female Today's Date: 05/06/2023  PCP: Ellyn Hack, MD REFERRING PROVIDER: Drema Dallas, DO  END OF SESSION:  OT End of Session - 05/06/23 1028     Visit Number 10    Number of Visits 12    Date for OT Re-Evaluation 05/22/23    Authorization Type [employer] Medicare primary    Progress Note Due on Visit 12    OT Start Time 1020    OT Stop Time 1100    OT Time Calculation (min) 40 min    Equipment Utilized During Treatment Custom splint modified    Activity Tolerance Patient tolerated treatment well    Behavior During Therapy WFL for tasks assessed/performed             Past Medical History:  Diagnosis Date   Diabetes mellitus without complication (HCC)    Hyperlipidemia    Hypertension    Loop recorder Biotronic 12/14/2020 12/14/2020   Scheduled Remote loop recorder check 12/14/2020: NSR. New implant.    Stroke [facility])    TIA (transient ischemic attack) 01/22/2019   Past Surgical History:  Procedure Laterality Date   NO PAST SURGERIES     Patient Active Problem List   Diagnosis Date Noted   Pain due to onychomycosis of toenails of both feet 10/24/2021   Blood clotting disorder (HCC) 10/24/2021   Loop recorder Biotronic 12/14/2020 12/14/2020   Uncontrolled type 2 diabetes mellitus with hyperglycemia, without long-term current use of insulin (HCC) 04/16/2020   CVA (cerebral vascular accident) (HCC) 04/15/2020   Cocaine use 04/15/2020   Acute CVA (cerebrovascular accident) (HCC) 03/04/2020   Stroke (cerebrum) (HCC) 03/03/2020   Weakness    Noncompliance with medication regimen    TIA (transient ischemic attack) 01/22/2019   Diabetes mellitus due to underlying condition without complications (HCC) 05/30/2014   Essential hypertension, benign 05/30/2014   Smoking 05/30/2014    ONSET DATE: 01/14/2023 (referral date)  REFERRING DIAG: G81.90  (ICD-10-CM) - Hemiparesis, unspecified hemiparesis etiology, unspecified laterality (HCC) I63.9 (ICD-10-CM) - Cerebrovascular accident (CVA), unspecified mechanism (HCC)  THERAPY DIAG:  Contracture of joint of left hand  Muscle weakness (generalized)  Other symptoms and signs involving the musculoskeletal system  Hemiplegia and hemiparesis following cerebral infarction affecting left non-dominant side (HCC)  Rationale for Evaluation and Treatment: Rehabilitation  SUBJECTIVE:   SUBJECTIVE STATEMENT: Pt reports going by [name].   Pt arrived with her mother again today.  During session, they reported that patient had a 'mirror' glove and were encouraged to bring it to her next session.  Pt accompanied by: self & [name]  PERTINENT HISTORY: History of multiple strokes with residual deficits, right ICA occlusion, hypertension, diabetes, history of cocaine use, former smoker  PRECAUTIONS: Other: Loop implant to monitor for atrial fibrillation  WEIGHT BEARING RESTRICTIONS: No  PAIN:  Are you having pain? No  FALLS: Has patient fallen in last 6 months? No  LIVING ENVIRONMENT: Lives with: lives alone Lives in: House/apartment - 1st floor apartment Stairs: No Has following equipment at home: Quad cane large base, Hemi walker, Shower bench, bed side commode, and Grab bars Bathroom setup: Tub/shower combo with grab bars, standard height toilet with bedside commode frame over top that has arm rests  PLOF:  Pt varies between independence and needing assistance pending on the task, likes to go get nails done, aunt drives patient  PATIENT GOALS: Use LUE better to  do daily tasks such as washing dishes, bathing, and cooking  OBJECTIVE:   HAND DOMINANCE: Right  ADLs: Eating: Independent Grooming: Independent brushing teeth and washing face, aunt helps with putting on deodorant UB Dressing: Pt can get a "big shirt on and off", assist with bra and jacket LB Dressing:  Independent Toileting: Independent Bathing: Independent LB, assist with UB Tub Shower transfers: Utilizes tub bench, sits down to shower Equipment: Transfer tub bench, Grab bars, bed side commode, and Reacher  IADLs: Shopping: Aunt performs grocery shopping, leisurely walks through [place], but not for shopping specifically Light housekeeping: Puts away dishes, aunt does more heavy household chores Meal Prep: Aunt makes her meals Community mobility: Utilizes quad cane at all times to include household mobility Medication management: Easy-open pill box Financial management: Independent Handwriting: 100% legible and Mild micrographia - pt reports baseline  MOBILITY STATUS: Independent  POSTURE COMMENTS:  No Significant postural limitations Sitting balance: Supports self with one extremity  ACTIVITY TOLERANCE: Activity tolerance: Pt reports no limitations with distance from cardiovascular standpoint, but muscles get fatigued  FUNCTIONAL OUTCOME MEASURES: FOTO: 33    UPPER EXTREMITY ROM:    RUE - AROM WNL LUE - PROM WFL, pt attempts to perform active shoulder flexion achieving approx 10-15 degrees (PROM up to 90*), no active elbow, wrist, or digit ROM  UPPER EXTREMITY MMT:     RUE - MMT WNL LUE - shoulder 2-/5, elbow 0/5, wrist 0/5, digits 0/5  HAND FUNCTION: Pt unable to move left hand at all  COORDINATION: RUE - no deficits; LUE - unable to test due to inability to actively move extremity  SENSATION: Light touch: Impaired - dull sensation LUE when compared to RUE  EDEMA: None  MUSCLE TONE: RUE: Within functional limits and LUE: Modifed Ashworth Scale 1+ = Slight increase in muscle tone, manifested by a catch, followed by minimal resistance throughout the remainder (less than half) of the ROM  COGNITION: Overall cognitive status:  Pt reports some difficulty with recall  VISION: Subjective report: No changes from CVA Baseline vision: Wears glasses all the  time Visual history:  None  VISION ASSESSMENT: WFL  Patient has difficulty with following activities due to following visual impairments: N/A  PERCEPTION: WFL  PRAXIS: WFL  Evaluation OBSERVATIONS: Pt with difficulty transferring from chair in lobby to standing with quad cane. Pt ambulating back to treatment clinic utilizing quad cane in RUE with minimal to no natural arm swing of LUE, compensatory walking pattern with left hip hike with swing, slower gait. Aunt is present with patient carrying other times.   TODAY'S TREATMENT:                                                                                                                               Orthotic Fabrication/Management:  Patient wore the custom resting hand splint fabricated last week and OTR observed ongoing slightly loose fit around her MCP joints and modifications made to strapping  ie) added additional padding to improve positioning, management of hypertonicity, and joint integrity of LUE following CVA. Thumb strap was re-cut from Velofoam verus Secureloop the previous strap had peeled apart.  Velcro hook was replaced where it had loosened from the thermoplastic splint.  Will continue to review splint fit again during next session to ensure fingers stay in place with appropriate fit.    Neuromuscular Re-Ed:  Reviewed HEP activities and varied activities with hemiplegic L UE to improve kinesthetic sense, posture, and proprioception through joint approximation and weightbearing over a ball with L hand and max assist to extend elbow in standing.   Encouraged visual regard for feedback when performing activities with affected L UE including continues - Seated Shoulder Shrugs and flex/extension in standing also.     During these activities, mother reported that patient had a Electronics engineer Gloves at home and they were encouraged to bring them to therapy next time to get a schedule of use established and help  with the Mirror activity option.  PATIENT EDUCATION: Education details: Splint and HEP review/update Person educated: Patient & mother Education method: Explanation, Demonstration, Tactile cues, Verbal cues, and Handouts Education comprehension: verbalized understanding, returned demonstration, verbal cues required, tactile cues required, and needs further education  HOME EXERCISE PROGRAM: 02/11/23 - LUE stretch HEP  Date: 02/18/2023 Prepared by: Wyn Forster Reep 02/25/23: None 03/11/23: Access Code: [email or web address] Prepared by: Amada Kingfisher 04/29/23: Above access code reprinted and updated for UE PROM/AAROM   GOALS: Goals reviewed with patient? Yes  SHORT TERM GOALS: Target date: 03/04/2023   Pt will be independent in LUE HEP. Baseline: Initiated Goal status: IN PROGRESS  2.  Pt will verbalize at least 2 adaptive equipment for completing ADLs. Baseline:  Goal status: IN PROGRESS 04/01/23 - Patient asked for can opener handout again.  3. Pt will verbalize at least 2 memory compensation strategies. Baseline: Reports intermittent recall impairments Goal status: IN PROGRESS   LONG TERM GOALS: Target date: 03/25/2023   Pt will complete FOTO assessment at time of discharge scoring 39 or greater indicating functional progression with ADL and IADL completion. Baseline: score 33 Goal status: IN PROGRESS  2.  Pt will verbalize at least 3 compensatory methods for completing IADLs such as dish washing. Baseline:  Goal status: IN PROGRESS  3.  Pt will report completing UB bathing at minimal assistance level. Baseline: Mod assist Goal status: IN PROGRESS  4.  Pt will complete one simple meal with Modified independence utilizing DME/AE as needed. Baseline:  Goal status: IN PROGRESS 04/01/23 - Makes hot dogs on the stove, Microwaveable    5.  Pt/caregiver will demonstrate appropriate wear schedule of newly fabricated splint ifor max ROM and comfort of L UE to minimize progression of  contractures.. Baseline: Goal modified/revised 04/22/23 Goal status: IN PROGRESS   ASSESSMENT:  CLINICAL IMPRESSION: Pt continues to tolerate L UE custom splint well and further minor modifications made to strap and Velcro hook for max fit.  Both patient and mother educated in splint checks and progression of HEP ideas for LUE to prevent contractures and maximize ROM of LUE with plans to bring Mirror Rehabilitation Robot Glove for trial next time.  Patient continues to benefit from skilled OT services to ensure proper carryover of splint application and progress performance deficits as a result of L hemiplegia and maximize independence in activities of daily living.     PERFORMANCE DEFICITS: in functional skills including ADLs, IADLs, coordination, dexterity, sensation, tone, ROM, strength, flexibility,  Fine motor control, Gross motor control, mobility, balance, body mechanics, endurance, and UE functional use, cognitive skills including energy/drive and memory, and psychosocial skills including coping strategies and habits.   IMPAIRMENTS: are limiting patient from ADLs, IADLs, work, leisure, and social participation.   CO-MORBIDITIES: may have co-morbidities  that affects occupational performance. Patient will benefit from skilled OT to address above impairments and improve overall function.  REHAB POTENTIAL: Fair due to onset of CVA being 2021   PLAN:  OT FREQUENCY: 1x/week  OT DURATION: 6 weeks (UPOC 04/01/23)  PLANNED INTERVENTIONS: self care/ADL training, therapeutic exercise, therapeutic activity, neuromuscular re-education, manual therapy, passive range of motion, balance training, functional mobility training, splinting, electrical stimulation, paraffin, fluidotherapy, moist heat, cryotherapy, contrast bath, patient/family education, cognitive remediation/compensation, visual/perceptual remediation/compensation, psychosocial skills training, energy conservation, coping strategies  training, DME and/or AE instructions, and Re-evaluation  RECOMMENDED OTHER SERVICES: Currently has PT eval  CONSULTED AND AGREED WITH PLAN OF CARE: Patient and family member/caregiver  PLAN FOR NEXT SESSION:   Check on splint for L UE.  Proofreader  Review and update HEPs with handouts as needed  Ensure good participation and awareness of AE for one-handed ADLs techniques with ADLs and IADL compensatory methods especially r/t PSFS activities - wash dishes, bathe and meal prep   Victorino Sparrow, OT 05/06/2023, 11:36 AM

## 2023-05-13 ENCOUNTER — Ambulatory Visit: Payer: 59 | Admitting: Occupational Therapy

## 2023-05-13 DIAGNOSIS — M6281 Muscle weakness (generalized): Secondary | ICD-10-CM

## 2023-05-13 DIAGNOSIS — M24542 Contracture, left hand: Secondary | ICD-10-CM | POA: Diagnosis not present

## 2023-05-13 DIAGNOSIS — R278 Other lack of coordination: Secondary | ICD-10-CM

## 2023-05-13 DIAGNOSIS — I69354 Hemiplegia and hemiparesis following cerebral infarction affecting left non-dominant side: Secondary | ICD-10-CM

## 2023-05-13 NOTE — Therapy (Unsigned)
OUTPATIENT OCCUPATIONAL THERAPY NEURO TREATMENT  Patient Name: Ebony Matthews MRN: [id number] DOB:1980/11/23, 42 y.o., female Today's Date: 05/13/2023  PCP: Ebony Hack, MD REFERRING PROVIDER: Drema Dallas, DO  END OF SESSION:  OT End of Session - 05/13/23 1019     Visit Number 11    Number of Visits 12    Date for OT Re-Evaluation 05/22/23    Authorization Type [employer] Medicare primary    Progress Note Due on Visit 12    OT Start Time 1018    OT Stop Time 1100    OT Time Calculation (min) 42 min    Equipment Utilized During Treatment Custom splint & Neuro Rehab Glove    Activity Tolerance Patient tolerated treatment well    Behavior During Therapy [facility] for tasks assessed/performed             Past Medical History:  Diagnosis Date   Diabetes mellitus without complication (HCC)    Hyperlipidemia    Hypertension    Loop recorder Biotronic 12/14/2020 12/14/2020   Scheduled Remote loop recorder check 12/14/2020: NSR. New implant.    Stroke [facility])    TIA (transient ischemic attack) 01/22/2019   Past Surgical History:  Procedure Laterality Date   NO PAST SURGERIES     Patient Active Problem List   Diagnosis Date Noted   Pain due to onychomycosis of toenails of both feet 10/24/2021   Blood clotting disorder (HCC) 10/24/2021   Loop recorder Biotronic 12/14/2020 12/14/2020   Uncontrolled type 2 diabetes mellitus with hyperglycemia, without long-term current use of insulin (HCC) 04/16/2020   CVA (cerebral vascular accident) (HCC) 04/15/2020   Cocaine use 04/15/2020   Acute CVA (cerebrovascular accident) (HCC) 03/04/2020   Stroke (cerebrum) (HCC) 03/03/2020   Weakness    Noncompliance with medication regimen    TIA (transient ischemic attack) 01/22/2019   Diabetes mellitus due to underlying condition without complications (HCC) 05/30/2014   Essential hypertension, benign 05/30/2014   Smoking 05/30/2014    ONSET DATE: 01/14/2023 (referral date)  REFERRING DIAG:  G81.90 (ICD-10-CM) - Hemiparesis, unspecified hemiparesis etiology, unspecified laterality (HCC) I63.9 (ICD-10-CM) - Cerebrovascular accident (CVA), unspecified mechanism (HCC)  THERAPY DIAG:  Muscle weakness (generalized)  Other lack of coordination  Hemiplegia and hemiparesis following cerebral infarction affecting left non-dominant side (HCC)  Rationale for Evaluation and Treatment: Rehabilitation  SUBJECTIVE:   SUBJECTIVE STATEMENT: Pt reports going by Ebony Matthews.   Pt arrived with her mother again today.  They brought her Neuromuscular glove tot ry today and reported that they were going to get her finger nails cut today (as she has very long acrylic nails on still at this time.  Pt accompanied by: self & mom - Ebony Matthews  PERTINENT HISTORY: History of multiple strokes with residual deficits, right ICA occlusion, hypertension, diabetes, history of cocaine use, former smoker  PRECAUTIONS: Other: Loop implant to monitor for atrial fibrillation  WEIGHT BEARING RESTRICTIONS: No  PAIN:  Are you having pain? No  FALLS: Has patient fallen in last 6 months? No  LIVING ENVIRONMENT: Lives with: lives alone Lives in: House/apartment - 1st floor apartment Stairs: No Has following equipment at home: Quad cane large base, Hemi walker, Shower bench, bed side commode, and Grab bars Bathroom setup: Tub/shower combo with grab bars, standard height toilet with bedside commode frame over top that has arm rests  PLOF:  Pt varies between independence and needing assistance pending on the task, likes to go get nails done, aunt drives patient  PATIENT  GOALS: Use LUE better to do daily tasks such as washing dishes, bathing, and cooking  OBJECTIVE:   HAND DOMINANCE: Right  ADLs: Eating: Independent Grooming: Independent brushing teeth and washing face, aunt helps with putting on deodorant UB Dressing: Pt can get a "big shirt on and off", assist with bra and jacket LB Dressing:  Independent Toileting: Independent Bathing: Independent LB, assist with UB Tub Shower transfers: Utilizes tub bench, sits down to shower Equipment: Transfer tub bench, Grab bars, bed side commode, and Reacher  IADLs: Shopping: Aunt performs grocery shopping, leisurely walks through [place], but not for shopping specifically Light housekeeping: Puts away dishes, aunt does more heavy household chores Meal Prep: Aunt makes her meals Community mobility: Utilizes quad cane at all times to include household mobility Medication management: Easy-open pill box Financial management: Independent Handwriting: 100% legible and Mild micrographia - pt reports baseline  MOBILITY STATUS: Independent  POSTURE COMMENTS:  No Significant postural limitations Sitting balance: Supports self with one extremity  ACTIVITY TOLERANCE: Activity tolerance: Pt reports no limitations with distance from cardiovascular standpoint, but muscles get fatigued  FUNCTIONAL OUTCOME MEASURES: FOTO: 33    UPPER EXTREMITY ROM:    RUE - AROM WNL LUE - PROM WFL, pt attempts to perform active shoulder flexion achieving approx 10-15 degrees (PROM up to 90*), no active elbow, wrist, or digit ROM  UPPER EXTREMITY MMT:     RUE - MMT WNL LUE - shoulder 2-/5, elbow 0/5, wrist 0/5, digits 0/5  HAND FUNCTION: Pt unable to move left hand at all  COORDINATION: RUE - no deficits; LUE - unable to test due to inability to actively move extremity  SENSATION: Light touch: Impaired - dull sensation LUE when compared to RUE  EDEMA: None  MUSCLE TONE: RUE: Within functional limits and LUE: Modifed Ashworth Scale 1+ = Slight increase in muscle tone, manifested by a catch, followed by minimal resistance throughout the remainder (less than half) of the ROM  COGNITION: Overall cognitive status:  Pt reports some difficulty with recall  VISION: Subjective report: No changes from CVA Baseline vision: Wears glasses all the  time Visual history:  None  VISION ASSESSMENT: WFL  Patient has difficulty with following activities due to following visual impairments: N/A  PERCEPTION: WFL  PRAXIS: WFL  Evaluation OBSERVATIONS: Pt with difficulty transferring from chair in lobby to standing with quad cane. Pt ambulating back to treatment clinic utilizing quad cane in RUE with minimal to no natural arm swing of LUE, compensatory walking pattern with left hip hike with swing, slower gait. Aunt is present with patient carrying other times.   TODAY'S TREATMENT:                                                                                                                               Orthotic Check (not billed):  Patient wore her custom resting hand splint Velofoam straps and Velcro hook were still in place and not in  need of modifications.  Will continue to review splint fit again during next session to ensure fingers stay in place with appropriate fit.    Neuromuscular Re-Ed:  Utilized Neuro [employer] with hemiplegic L UE to improve kinesthetic sense, AAROM, and proprioception.  Education provided on application of rehabilitation glove to train her hemiplegic LUE hand and fingers. It is designed to promote blood circulation and stimulate nerve recovery in the hand and is especially suitable for patients with stroke.  Throughout the session, education is provided on applying the glove to her hand, aligning the glove behind her joints, using an object to pick up and release as well as actively engaging herself in the motions.    In addition, Health and safety inspector was trialed as the upgraded Hand Rehabilitation Robotic Glove has the ability to use the principle of mirroring to help restore motion to the affected hand. Pt and mother shown how to use the unaffected RUE/hand to facilitate flexion and extension.  The glove on the affected LUE replicates the movement of the unaffected hand, enabling constant imitation and  re-learning.  PATIENT EDUCATION: Education details: Use of Neuro Rehab Glove Person educated: Patient & mother Education method: Explanation, Demonstration, Tactile cues, Verbal cues, and Handouts (instruction manual) Education comprehension: verbalized understanding, returned demonstration, verbal cues required, tactile cues required, and needs further education  HOME EXERCISE PROGRAM: 02/11/23 - LUE stretch HEP  Date: 02/18/2023 Prepared by: Wyn Forster Reep 02/25/23: None 03/11/23: Access Code: [email or web address] Prepared by: Amada Kingfisher 04/29/23: Above access code reprinted and updated for UE PROM/AAROM   GOALS: Goals reviewed with patient? Yes  SHORT TERM GOALS: Target date: 03/04/2023   Pt will be independent in LUE HEP. Baseline: Initiated Goal status: IN PROGRESS  2.  Pt will verbalize at least 2 adaptive equipment for completing ADLs. Baseline:  Goal status: IN PROGRESS 04/01/23 - Patient asked for can opener handout again. 05/13/23 - pt asked for jar opener handout  3. Pt will verbalize at least 2 memory compensation strategies. Baseline: Reports intermittent recall impairments Goal status: IN PROGRESS   LONG TERM GOALS: Target date: 03/25/2023   Pt will complete FOTO assessment at time of discharge scoring 39 or greater indicating functional progression with ADL and IADL completion. Baseline: score 33 Goal status: IN PROGRESS  2.  Pt will verbalize at least 3 compensatory methods for completing IADLs such as dish washing. Baseline:  Goal status: IN PROGRESS  3.  Pt will report completing UB bathing at minimal assistance level. Baseline: Mod assist Goal status: IN PROGRESS  4.  Pt will complete one simple meal with Modified independence utilizing DME/AE as needed. Baseline:  Goal status: IN PROGRESS 04/01/23 - Makes hot dogs on the stove, Microwaveable    5.  Pt/caregiver will demonstrate appropriate wear schedule of newly fabricated splint ifor max ROM and comfort of L UE  to minimize progression of contractures.. Baseline: Goal modified/revised 04/22/23 Goal status: MET   ASSESSMENT:  CLINICAL IMPRESSION: Pt continues to tolerate L UE custom splint well and no modifications needed today.  Both patient and mother educated in use of Field seismologist for LUE to promote AAROM of LUE.  Patient continues to benefit from skilled OT services to ensure proper carryover of splint application and progress performance deficits as a result of L hemiplegia and maximize independence in activities of daily living.     PERFORMANCE DEFICITS: in functional skills including ADLs, IADLs, coordination, dexterity, sensation, tone, ROM, strength, flexibility, Fine motor control,  Gross motor control, mobility, balance, body mechanics, endurance, and UE functional use, cognitive skills including energy/drive and memory, and psychosocial skills including coping strategies and habits.   IMPAIRMENTS: are limiting patient from ADLs, IADLs, work, leisure, and social participation.   CO-MORBIDITIES: may have co-morbidities  that affects occupational performance. Patient will benefit from skilled OT to address above impairments and improve overall function.  REHAB POTENTIAL: Fair due to onset of CVA being 2021   PLAN:  OT FREQUENCY: 1x/week  OT DURATION: 6 weeks (UPOC 04/01/23)  PLANNED INTERVENTIONS: self care/ADL training, therapeutic exercise, therapeutic activity, neuromuscular re-education, manual therapy, passive range of motion, balance training, functional mobility training, splinting, electrical stimulation, paraffin, fluidotherapy, moist heat, cryotherapy, contrast bath, patient/family education, cognitive remediation/compensation, visual/perceptual remediation/compensation, psychosocial skills training, energy conservation, coping strategies training, DME and/or AE instructions, and Re-evaluation  RECOMMENDED OTHER SERVICES: Currently has PT eval  CONSULTED AND  AGREED WITH PLAN OF CARE: Patient and family member/caregiver  PLAN FOR NEXT SESSION:   Review ADL goals--Ensure good participation and awareness of AE for one-handed ADLs techniques with ADLs and IADL compensatory methods especially r/t PSFS activities - wash dishes, bathe and meal prep  Trial Mirror Rehabilitation Robot Gloves again  Review and update HEPs with handouts as needed    Victorino Sparrow, OT 05/13/2023, 12:03 PM

## 2023-05-15 ENCOUNTER — Ambulatory Visit: Payer: 59

## 2023-05-15 DIAGNOSIS — I10 Essential (primary) hypertension: Secondary | ICD-10-CM

## 2023-05-15 DIAGNOSIS — Z8673 Personal history of transient ischemic attack (TIA), and cerebral infarction without residual deficits: Secondary | ICD-10-CM

## 2023-05-15 DIAGNOSIS — I63231 Cerebral infarction due to unspecified occlusion or stenosis of right carotid arteries: Secondary | ICD-10-CM

## 2023-05-19 ENCOUNTER — Emergency Department (HOSPITAL_COMMUNITY): Payer: 59

## 2023-05-19 ENCOUNTER — Encounter (HOSPITAL_COMMUNITY): Payer: Self-pay | Admitting: Emergency Medicine

## 2023-05-19 ENCOUNTER — Other Ambulatory Visit: Payer: Self-pay

## 2023-05-19 ENCOUNTER — Emergency Department (HOSPITAL_COMMUNITY)
Admission: EM | Admit: 2023-05-19 | Discharge: 2023-05-19 | Disposition: A | Discharge status: Home / Self Care | Payer: 59 | Attending: Emergency Medicine | Admitting: Emergency Medicine

## 2023-05-19 DIAGNOSIS — Z794 Long term (current) use of insulin: Secondary | ICD-10-CM | POA: Diagnosis not present

## 2023-05-19 DIAGNOSIS — N179 Acute kidney failure, unspecified: Secondary | ICD-10-CM | POA: Diagnosis not present

## 2023-05-19 DIAGNOSIS — R1900 Intra-abdominal and pelvic swelling, mass and lump, unspecified site: Secondary | ICD-10-CM

## 2023-05-19 DIAGNOSIS — N3001 Acute cystitis with hematuria: Secondary | ICD-10-CM | POA: Diagnosis not present

## 2023-05-19 DIAGNOSIS — N3000 Acute cystitis without hematuria: Secondary | ICD-10-CM

## 2023-05-19 DIAGNOSIS — R944 Abnormal results of kidney function studies: Secondary | ICD-10-CM | POA: Diagnosis present

## 2023-05-19 LAB — CBC WITH DIFFERENTIAL/PLATELET
Abs Immature Granulocytes: 0.01 10*3/uL (ref 0.00–0.07)
Basophils Absolute: 0.1 10*3/uL (ref 0.0–0.1)
Basophils Relative: 1 %
Eosinophils Absolute: 0.1 10*3/uL (ref 0.0–0.5)
Eosinophils Relative: 2 %
HCT: 31.8 % — ABNORMAL LOW (ref 36.0–46.0)
Hemoglobin: 9.9 g/dL — ABNORMAL LOW (ref 12.0–15.0)
Immature Granulocytes: 0 %
Lymphocytes Relative: 37 %
Lymphs Abs: 2.5 10*3/uL (ref 0.7–4.0)
MCH: 28.1 pg (ref 26.0–34.0)
MCHC: 31.1 g/dL (ref 30.0–36.0)
MCV: 90.3 fL (ref 80.0–100.0)
Monocytes Absolute: 0.7 10*3/uL (ref 0.1–1.0)
Monocytes Relative: 11 %
Neutro Abs: 3.4 10*3/uL (ref 1.7–7.7)
Neutrophils Relative %: 49 %
Platelets: 242 10*3/uL (ref 150–400)
RBC: 3.52 MIL/uL — ABNORMAL LOW (ref 3.87–5.11)
RDW: 16.6 % — ABNORMAL HIGH (ref 11.5–15.5)
WBC: 6.9 10*3/uL (ref 4.0–10.5)
nRBC: 0 % (ref 0.0–0.2)

## 2023-05-19 LAB — COMPREHENSIVE METABOLIC PANEL
ALT: 41 U/L (ref 0–44)
AST: 52 U/L — ABNORMAL HIGH (ref 15–41)
Albumin: 3.8 g/dL (ref 3.5–5.0)
Alkaline Phosphatase: 61 U/L (ref 38–126)
Anion gap: 13 (ref 5–15)
BUN: 40 mg/dL — ABNORMAL HIGH (ref 6–20)
CO2: 22 mmol/L (ref 22–32)
Calcium: 9.6 mg/dL (ref 8.9–10.3)
Chloride: 103 mmol/L (ref 98–111)
Creatinine, Ser: 2.93 mg/dL — ABNORMAL HIGH (ref 0.44–1.00)
GFR, Estimated: 20 mL/min — ABNORMAL LOW (ref >60)
Glucose, Bld: 111 mg/dL — ABNORMAL HIGH (ref 70–99)
Potassium: 3.6 mmol/L (ref 3.5–5.1)
Sodium: 138 mmol/L (ref 135–145)
Total Bilirubin: 0.4 mg/dL (ref 0.3–1.2)
Total Protein: 7.3 g/dL (ref 6.5–8.1)

## 2023-05-19 LAB — URINALYSIS, W/ REFLEX TO CULTURE (INFECTION SUSPECTED)
Bilirubin Urine: NEGATIVE
Glucose, UA: 150 mg/dL — AB
Ketones, ur: NEGATIVE mg/dL
Nitrite: NEGATIVE
Protein, ur: 30 mg/dL — AB
Specific Gravity, Urine: 1.01 (ref 1.005–1.030)
WBC, UA: >50 WBC/hpf (ref 0–5)
pH: 5 (ref 5.0–8.0)

## 2023-05-19 LAB — POC URINE PREG, ED: Preg Test, Ur: NEGATIVE

## 2023-05-19 LAB — CREATININE, URINE, RANDOM: Creatinine, Urine: 49 mg/dL

## 2023-05-19 MED ORDER — LACTATED RINGERS IV BOLUS
1000.0000 mL | Freq: Once | INTRAVENOUS | Status: AC
  Administered 2023-05-19: 1000 mL via INTRAVENOUS

## 2023-05-19 MED ORDER — SODIUM CHLORIDE 0.9 % IV BOLUS
1000.0000 mL | Freq: Once | INTRAVENOUS | Status: AC
  Administered 2023-05-19: 1000 mL via INTRAVENOUS

## 2023-05-19 MED ORDER — CEPHALEXIN 500 MG PO CAPS: 500.0000 mg | ORAL_CAPSULE | Freq: Four times a day (QID) | ORAL | 0 refills | Status: DC

## 2023-05-19 NOTE — ED Notes (Signed)
Pt to CT

## 2023-05-19 NOTE — Discharge Instructions (Addendum)
Start the antibiotics for the urinary tract infection.  Follow-up with nephrology and they can help follow your kidney function.  Stop the valsartan hydrochlorothiazide for now.  There is also a mass in the pelvis can be followed either by her primary care or by gynecology.  Your primary care doctor will need to adjust blood pressure medicines.

## 2023-05-19 NOTE — ED Notes (Signed)
Bladder scan 999+

## 2023-05-19 NOTE — ED Triage Notes (Signed)
Pt arrives via POV from home with a call from PCP to come to the hospital because she had abnormal kidney function from lab reports. Pt is unable to recall the result. Pt awake, alert, appropriate, VSS at present.

## 2023-05-19 NOTE — ED Provider Triage Note (Signed)
Emergency Medicine Provider Triage Evaluation Note  Ebony Matthews , a 42 y.o. female  was evaluated in triage.  Pt complains of abnormal labs after lab work that was done 2 days ago.  She is asymptomatic at this time, no other complaints.  Review of Systems  Positive:  Negative:   Physical Exam  There were no vitals taken for this visit. Gen:   Awake, no distress  Resp:  Normal effort  MSK:   Moves extremities without difficulty  Other:    Medical Decision Making  Medically screening exam initiated at 1:36 PM.  Appropriate orders placed.  Marleth Mckendall was informed that the remainder of the evaluation will be completed by another provider, this initial triage assessment does not replace that evaluation, and the importance of remaining in the ED until their evaluation is complete.     Claude Manges, PA-C 05/19/23 1350

## 2023-05-19 NOTE — ED Provider Notes (Signed)
EMERGENCY DEPARTMENT AT [facility] Provider Note   CSN: [id number] Arrival date & time: 05/19/23  1152     History  No chief complaint on file.   Ebony Matthews is a 42 y.o. female.  HPI Patient sent in for worsening kidney function.  Reportedly had blood work done 2 days ago.  Patient states she is feeling fine.  Reportedly told to come to the ER.  Says last time her kidney function was checked it was "7".  Today creatinine is 2.9.  Reportedly has had good oral intake.  Denies diarrhea.  Denies vomiting.  States good urination.  No blood in the urine.    Home Medications Prior to Admission medications   Medication Sig Start Date End Date Taking? Authorizing Provider  cephALEXin (KEFLEX) 500 MG capsule Take 1 capsule (500 mg total) by mouth 4 (four) times daily. 05/19/23  Yes Benjiman Core, MD  atorvastatin (LIPITOR) 40 MG tablet Take 40 mg by mouth at bedtime.    [provider]  BD PEN NEEDLE MICRO U/F 32G X 6 MM MISC Inject into the skin as directed. 08/13/22   [provider]  clopidogrel (PLAVIX) 75 MG tablet Take 1 tablet (75 mg total) by mouth daily. 09/25/20   Drema Dallas, DO  Empagliflozin (JARDIANCE PO) Take 10 mg by mouth at bedtime.    [provider]  ergocalciferol (VITAMIN D2) 1.25 MG (50000 UT) capsule Take 50,000 Units by mouth once a week.    [provider]  LANTUS SOLOSTAR 100 UNIT/ML Solostar Pen Inject 12 Units into the skin daily. 08/13/22   [provider]  metFORMIN (GLUCOPHAGE) 1000 MG tablet Take 1 tablet (1,000 mg total) by mouth 2 (two) times daily. 03/04/20   Lurene Shadow, MD  metoprolol succinate (TOPROL-XL) 25 MG 24 hr tablet TAKE 1 TABLET BY MOUTH ONCE DAILY. HOLD IF BLOOD PRESSURE NUMBER LESS THAN OR HEART RATE LESS THAN 60 BPM(PULSE) 10/02/22   Tolia, Sunit, DO  nitroGLYCERIN (NITROSTAT) 0.4 MG SL tablet Place 1 tablet (0.4 mg total) under the tongue every 5 (five) minutes  as needed for chest pain. If you require more than two tablets five minutes apart go to the nearest ER via EMS. 12/21/20 11/14/22  Tessa Lerner, DO      Allergies    Patient has no known allergies.    Review of Systems   Review of Systems  Physical Exam Updated Vital Signs BP 124/74   Pulse 82   Temp 97.9 F (36.6 C) (Oral)   Resp 20   Ht 5\' 7"  (1.702 m)   Wt 107 kg   SpO2 100%   BMI 36.95 kg/m  Physical Exam Vitals and nursing note reviewed.  Cardiovascular:     Rate and Rhythm: Regular rhythm.  Pulmonary:     Breath sounds: No wheezing.  Abdominal:     Tenderness: There is no abdominal tenderness.  Musculoskeletal:        General: No tenderness.     Cervical back: Neck supple.  Neurological:     Mental Status: She is alert.     ED Results / Procedures / Treatments   Labs (all labs ordered are listed, but only abnormal results are displayed) Labs Reviewed  CBC WITH DIFFERENTIAL/PLATELET - Abnormal; Notable for the following components:      Result Value   RBC 3.52 (*)    Hemoglobin 9.9 (*)    HCT 31.8 (*)    RDW 16.6 (*)  All other components within normal limits  COMPREHENSIVE METABOLIC PANEL - Abnormal; Notable for the following components:   Glucose, Bld 111 (*)    BUN 40 (*)    Creatinine, Ser 2.93 (*)    AST 52 (*)    GFR, Estimated 20 (*)    All other components within normal limits  URINALYSIS, W/ REFLEX TO CULTURE (INFECTION SUSPECTED) - Abnormal; Notable for the following components:   APPearance CLOUDY (*)    Glucose, UA 150 (*)    Hgb urine dipstick LARGE (*)    Protein, ur 30 (*)    Leukocytes,Ua MODERATE (*)    Bacteria, UA MANY (*)    All other components within normal limits  URINE CULTURE  CREATININE, URINE, RANDOM  POC URINE PREG, ED    EKG None  Radiology CT Renal Stone Study  Result Date: 05/19/2023 CLINICAL DATA:  Abnormal kidney function from lab reports. Query renal ischemia or infarction. EXAM: CT ABDOMEN AND PELVIS  WITHOUT CONTRAST TECHNIQUE: Multidetector CT imaging of the abdomen and pelvis was performed following the standard protocol without IV contrast. RADIATION DOSE REDUCTION: This exam was performed according to the departmental dose-optimization program which includes automated exposure control, adjustment of the mA and/or kV according to patient size and/or use of iterative reconstruction technique. COMPARISON:  Renal ultrasound 05/19/2023 FINDINGS: Lower chest: No acute abnormality. Hepatobiliary: Unremarkable liver. Normal gallbladder. No biliary dilation. Pancreas: Unremarkable. Spleen: Unremarkable. Adrenals/Urinary Tract: Normal adrenal glands. Unremarkable noncontrast appearance of the kidneys. No urinary calculi or hydronephrosis. Foley catheter in the nondistended bladder. Stomach/Bowel: Normal caliber large and small bowel. No bowel wall thickening. The appendix is normal.Stomach is within normal limits. Vascular/Lymphatic: Aortic atherosclerosis. No enlarged abdominal or pelvic lymph nodes. Reproductive: Large cystic mass in the pelvis measures 17.9 x 11.6 x 17.7 cm. There is mural nodularity and thickening along the right side of the mass. Additional focus of calcification within the mass (series 3/image 72). Favor adnexal origin. The mass abuts the anterior surface of the uterus. Tampon in the vagina. Other: No free intraperitoneal fluid or air. Musculoskeletal: No acute fracture.  No destructive osseous lesion. IMPRESSION: 1. Large complex cystic mass in the pelvis measures 17.9 cm. Favor adnexal origin, possibly representing a low-grade ovarian cystic neoplasm. Recommend gynecologic consultation and/or MRI. MRI without and with contrast would be preferable if kidney function allows. 2. Unremarkable noncontrast appearance of the kidneys. No urinary calculi or hydronephrosis. Electronically Signed   By: Minerva Fester M.D.   On: 05/19/2023 22:35   US Renal  Result Date: 05/19/2023 CLINICAL DATA:   Acute kidney injury EXAM: RENAL / URINARY TRACT ULTRASOUND COMPLETE COMPARISON:  None available FINDINGS: Right Kidney: Renal measurements: 9.5 x 5.1 x 5.1 cm = volume: 128 mL. Echogenicity within normal limits. No mass or hydronephrosis visualized. Left Kidney: Obscured by shadowing bowel gas. Bladder: Appears normal for degree of bladder distention. Other: None. IMPRESSION: 1. Normal radiographic appearance of the right kidney. 2. Left kidney was not visualized. The left retroperitoneum is obscured by shadowing bowel gas. Electronically Signed   By: Acquanetta Belling M.D.   On: 05/19/2023 17:30   DG Chest 2 View  Result Date: 05/19/2023 CLINICAL DATA:  Abnormal kidney function. EXAM: CHEST - 2 VIEW COMPARISON:  March 03, 2020. FINDINGS: The heart size and mediastinal contours are within normal limits. Both lungs are clear. The visualized skeletal structures are unremarkable. IMPRESSION: No active cardiopulmonary disease. Electronically Signed   By: Zenda Alpers.D.  On: 05/19/2023 16:15    Procedures Procedures    Medications Ordered in ED Medications  sodium chloride 0.9 % bolus 1,000 mL (0 mLs Intravenous Stopped 05/19/23 1827)  lactated ringers bolus 1,000 mL (0 mLs Intravenous Stopped 05/19/23 2315)    ED Course/ Medical Decision Making/ A&P                                 Medical Decision Making Amount and/or Complexity of Data Reviewed Labs: ordered. Radiology: ordered.  Risk Prescription drug management.   Patient with worsening kidney function.  Initially some mild hypotension that is resolved.  Potentially could be due to volume depletion.  Creatinine is elevated as is the BUN.  Will give fluid bolus.  Discussed with Dr. Marisue Humble from nephrology who will follow patient as an outpatient and help arrange more urgent follow-up.  Will get renal ultrasound and also urinalysis.  If potentially has protein and blood may need more urgent workup.  There has been a delay getting urine.   Patient has been unable to urinate.  Had been refusing cath which had been ordered.  Pending a postvoid residual.  Pending urinalysis pregnancy test.  Ultrasound did not visualize left kidney so we will get CT scan to further evaluate.  Urinalysis did show some hematuria and small amount of protein.  However catheter had been placed after postvoid residual from a bladder scanner showed a liter.  However only 100 cc came out with Foley catheter.  CT scan done and does show potentially cystic pelvic mass.  Does not appear to be bladder.  Foley catheter been removed.  Does not have a gynecologist can follow-up with on-call gynecology.  Urine does show infection will be treated.  Culture sent.  Fluid had been given.  Can follow with nephrology and PCP.  Will stop the ARB that she is on.  PCP can adjust medications.  Will discharge home.        Final Clinical Impression(s) / ED Diagnoses Final diagnoses:  AKI (acute kidney injury) (HCC)  Acute cystitis without hematuria  Pelvic mass    Rx / DC Orders ED Discharge Orders          Ordered    cephALEXin (KEFLEX) 500 MG capsule  4 times daily        05/19/23 2254              Benjiman Core, MD 05/19/23 2325

## 2023-05-19 NOTE — ED Notes (Signed)
MD made aware that urine output did not match bladder scan amount. MD at bedside with [place].

## 2023-05-19 NOTE — ED Notes (Signed)
Back from CT

## 2023-05-19 NOTE — ED Notes (Signed)
Pt aware of needed urine 

## 2023-05-19 NOTE — ED Notes (Signed)
Pt taken to bathroom at this time, no urine was able to be captured in the hat

## 2023-05-20 ENCOUNTER — Ambulatory Visit: Payer: 59 | Admitting: Occupational Therapy

## 2023-05-21 ENCOUNTER — Encounter: Payer: Self-pay | Admitting: Cardiology

## 2023-05-21 ENCOUNTER — Ambulatory Visit: Payer: 59 | Admitting: Cardiology

## 2023-05-21 VITALS — BP 112/55 | HR 93 | Resp 16 | Ht 67.0 in | Wt 208.0 lb

## 2023-05-21 DIAGNOSIS — Z87891 Personal history of nicotine dependence: Secondary | ICD-10-CM

## 2023-05-21 DIAGNOSIS — I63231 Cerebral infarction due to unspecified occlusion or stenosis of right carotid arteries: Secondary | ICD-10-CM

## 2023-05-21 DIAGNOSIS — I10 Essential (primary) hypertension: Secondary | ICD-10-CM

## 2023-05-21 DIAGNOSIS — G459 Transient cerebral ischemic attack, unspecified: Secondary | ICD-10-CM

## 2023-05-21 DIAGNOSIS — Z95818 Presence of other cardiac implants and grafts: Secondary | ICD-10-CM

## 2023-05-21 DIAGNOSIS — E7800 Pure hypercholesterolemia, unspecified: Secondary | ICD-10-CM

## 2023-05-21 DIAGNOSIS — E1165 Type 2 diabetes mellitus with hyperglycemia: Secondary | ICD-10-CM

## 2023-05-21 DIAGNOSIS — Z8673 Personal history of transient ischemic attack (TIA), and cerebral infarction without residual deficits: Secondary | ICD-10-CM

## 2023-05-21 LAB — URINE CULTURE: Culture: >=100000 — AB

## 2023-05-21 NOTE — Progress Notes (Signed)
ID:  Ebony Matthews, DOB 19-Jul-1981, MRN [id number]  PCP:  Ellyn Hack, MD  Cardiologist:  Tessa Lerner, DO, [facility] (established care 11/30/2020) Cardiology EP: Dr. Lewayne Bunting.   Date: 05/21/23 Last Office Visit: 11/14/2022  Chief Complaint  Patient presents with   Hypertension   Follow-up    6 month   HPI  Ebony Matthews is a 42 y.o. female with past medical history and cardiovascular risk factors: History of multiple strokes with residual deficits, right ICA occlusion, hypertension, diabetes, history of cocaine use, former smoker.  Patient establish care with the practice after her 2 strokes.  Her initial stroke was likely secondary to cryptogenic etiology.  The second stroke was in July 2021 likely secondary to right ICA occlusion.  After experiencing this 2 CVAs shared decision was to proceed with loop recorder implant to monitor for atrial fibrillation.  Since establishing care she has not had any episodes of A-fib via her loop recorder implant.  She presents today for 73-month follow-up visit.  Over the last 6 months he denies anginal chest pain or heart failure symptoms.  Recently was hospitalized in August 2024 due to worsening renal function.  CT renal protocol study noted a large cystic mass in the pelvic region which is likely the cause of her worsening renal function.  She still is in the process of being evaluated by GYN.  FUNCTIONAL STATUS: Limited as she ambulates with either a cane or walker after her strokes.  ALLERGIES: No Known Allergies  MEDICATION LIST PRIOR TO VISIT: Current Meds  Medication Sig   atorvastatin (LIPITOR) 40 MG tablet Take 40 mg by mouth at bedtime.   BD PEN NEEDLE MICRO U/F 32G X 6 MM MISC Inject into the skin as directed.   cephALEXin (KEFLEX) 500 MG capsule Take 1 capsule (500 mg total) by mouth 4 (four) times daily.   clopidogrel (PLAVIX) 75 MG tablet Take 1 tablet (75 mg total) by mouth daily.   Empagliflozin (JARDIANCE PO) Take  10 mg by mouth at bedtime.   ergocalciferol (VITAMIN D2) 1.25 MG (50000 UT) capsule Take 50,000 Units by mouth once a week.   LANTUS SOLOSTAR 100 UNIT/ML Solostar Pen Inject 12 Units into the skin daily.   metFORMIN (GLUCOPHAGE) 1000 MG tablet Take 1 tablet (1,000 mg total) by mouth 2 (two) times daily.   metoprolol succinate (TOPROL-XL) 25 MG 24 hr tablet TAKE 1 TABLET BY MOUTH ONCE DAILY. HOLD IF BLOOD PRESSURE NUMBER LESS THAN OR HEART RATE LESS THAN 60 BPM(PULSE)   nitroGLYCERIN (NITROSTAT) 0.4 MG SL tablet Place 1 tablet (0.4 mg total) under the tongue every 5 (five) minutes as needed for chest pain. If you require more than two tablets five minutes apart go to the nearest ER via EMS.   [DISCONTINUED] rosuvastatin (CRESTOR) 40 MG tablet Take 40 mg by mouth at bedtime.     PAST MEDICAL HISTORY: Past Medical History:  Diagnosis Date   Diabetes mellitus without complication (HCC)    Hyperlipidemia    Hypertension    Loop recorder [employer] 12/14/2020 12/14/2020   Scheduled Remote loop recorder check 12/14/2020: NSR. New implant.    Stroke [facility])    TIA (transient ischemic attack) 01/22/2019    PAST SURGICAL HISTORY: Past Surgical History:  Procedure Laterality Date   NO PAST SURGERIES      FAMILY HISTORY: The patient family history includes Diabetes in an other family member; Hypertension in her maternal grandmother.  SOCIAL HISTORY:  The patient  reports that she quit smoking about 3 years ago. Her smoking use included cigarettes. She started smoking about 18 years ago. She has a 15 pack-year smoking history. She has never used smokeless tobacco. She reports that she does not currently use drugs after having used the following drugs: Marijuana and Cocaine. She reports that she does not drink alcohol.  REVIEW OF SYSTEMS: Review of Systems  Cardiovascular:  Negative for chest pain, claudication, dyspnea on exertion, leg swelling, near-syncope, orthopnea, palpitations,  paroxysmal nocturnal dyspnea and syncope.  Respiratory:  Negative for shortness of breath.     PHYSICAL EXAM:    05/21/2023   10:16 AM 05/19/2023    8:30 PM 05/19/2023    5:30 PM  Vitals with BMI  Height 5\' 7"     Weight 208 lbs    BMI 32.57    Systolic 112 124 932  Diastolic 55 74 85  Pulse 93 82 86   Physical Exam Constitutional:      Appearance: Normal appearance. She is obese. She is not ill-appearing.     Comments: Walks w/ cane  Cardiovascular:     Rate and Rhythm: Normal rate and regular rhythm.     Pulses:          Radial pulses are 2+ on the right side and 2+ on the left side.       Dorsalis pedis pulses are 1+ on the right side and 1+ on the left side.     Heart sounds: Normal heart sounds. No murmur heard.    No gallop.  Pulmonary:     Effort: Pulmonary effort is normal.     Breath sounds: Normal breath sounds. No wheezing or rales.  Musculoskeletal:     Right lower leg: No edema.     Left lower leg: No edema.  Neurological:     Mental Status: She is alert.     Motor: Weakness present.     Gait: Gait abnormal.     Comments: S/P CVA with right sided residual     Radiology/Studies:    Ct Angio Head W Or Wo Contrast Result Date: 01/22/2019 CLINICAL DATA:  Right facial droop, slurred speech, right arm numbness. Diabetes and hypertension EXAM: CT ANGIOGRAPHY HEAD AND NECK TECHNIQUE: Multidetector CT imaging of the head and neck was performed using the standard protocol during bolus administration of intravenous contrast. Multiplanar CT image reconstructions and MIPs were obtained to evaluate the vascular anatomy. Carotid stenosis measurements (when applicable) are obtained utilizing NASCET criteria, using the distal internal carotid diameter as the denominator. CONTRAST:  75mL OMNIPAQUE IOHEXOL 350 MG/ML SOLN COMPARISON:  None. FINDINGS: CT HEAD FINDINGS Brain: No evidence of acute infarction, hemorrhage, hydrocephalus, extra-axial collection or mass lesion/mass effect.  Vascular: Negative for hyperdense vessel Skull: Negative Sinuses: Negative Orbits: Negative Review of the MIP images confirms the above findings CTA NECK FINDINGS Aortic arch: Standard branching. Imaged portion shows no evidence of aneurysm or dissection. No significant stenosis of the major arch vessel origins. Right carotid system: Right carotid bifurcation widely patent. Mild fusiform dilatation of the distal right internal carotid artery below the skull base with mild irregularity, question mild FMD. No significant stenosis Left carotid system: Similar findings to the right carotid with mild fusiform dilatation of the internal carotid artery below the skull base. Mild narrowing at the origin of the left internal carotid artery. No calcification or aneurysm. No significant stenosis. Vertebral arteries: Both vertebral arteries widely patent to the basilar without stenosis. Skeleton: No acute skeletal  abnormality. Poor dentition with numerous caries. Other neck: Negative for soft tissue mass or adenopathy. Upper chest: Negative Review of the MIP images confirms the above findings CTA HEAD FINDINGS Anterior circulation: Mild narrowing of the right cavernous carotid compared to the right internal carotid artery below the skull base. Right anterior and middle cerebral arteries widely patent Diffuse narrowing left cavernous carotid with mild-to-moderate focal stenosis proximal cavernous carotid. Left anterior and middle cerebral arteries widely patent. Posterior circulation: Both vertebral arteries patent to the basilar. PICA patent bilaterally. Basilar widely patent. Superior cerebellar and posterior cerebral arteries widely patent. Venous sinuses: Patent Anatomic variants: None Delayed phase: Normal enhancement postcontrast administration. Review of the MIP images confirms the above findings IMPRESSION: 1. Negative CT head 2. Negative for emergent large vessel occlusion 3. Mild fusiform dilatation of the internal  carotid artery below the skull base with subsequent narrowing to the cavernous carotid bilaterally. Question FMD. No intracranial aneurysm. 4. These results were called by telephone at the time of interpretation on 01/22/2019 at 9:36 pm to Dr. Caryl Pina , who verbally acknowledged these results. Electronically Signed   By: Marlan Palau M.D.   On: 01/22/2019 21:36    Dg Chest 2 View Result Date: 01/23/2019 CLINICAL DATA:  Dizziness EXAM: CHEST - 2 VIEW COMPARISON:  07/29/2016 FINDINGS: The heart size and mediastinal contours are within normal limits. Both lungs are clear. The visualized skeletal structures are unremarkable. IMPRESSION: No active cardiopulmonary disease. Electronically Signed   By: Deatra Robinson M.D.   On: 01/23/2019 02:35     Mr Brain Wo Contrast Result Date: 01/23/2019 CLINICAL DATA:  Near syncopal event with slurred speech and right upper extremity numbness. Right facial droop. Symptoms resolved after 5 minutes. EXAM: MRI HEAD WITHOUT CONTRAST TECHNIQUE: Multiplanar, multiecho pulse sequences of the brain and surrounding structures were obtained without intravenous contrast. COMPARISON:  CTA head neck 01/22/2019 FINDINGS: BRAIN: There is multifocal acute ischemia, mostly within the left hemisphere, with punctate foci along the left precentral gyrus and within the posterior left parietal lobe. There is a single focus in the right hemispheric white matter and a small linear focus in the left occipital lobe. The midline structures are normal. No midline shift or other mass effect. Multifocal white matter hyperintensity, most commonly due to chronic ischemic microangiopathy. The cerebral and cerebellar volume are age-appropriate. No hydrocephalus. Susceptibility-sensitive sequences show no chronic microhemorrhage or superficial siderosis. No mass lesion. VASCULAR: The major intracranial arterial and venous sinus flow voids are normal. SKULL AND UPPER CERVICAL SPINE: Calvarial bone marrow  signal is normal. There is no skull base mass. Visualized upper cervical spine and soft tissues are normal. SINUSES/ORBITS: No fluid levels or advanced mucosal thickening. No mastoid or middle ear effusion. The orbits are normal. IMPRESSION: 1. Multifocal acute ischemia, predominantly in the left hemisphere, although there is a single focus within the right frontal white matter. Punctate ischemic foci along the left motor strip are compatible with reported right upper extremity symptoms. 2. No hemorrhage or mass effect. 3. Mild findings of chronic ischemic microangiopathy. Electronically Signed   By: Deatra Robinson M.D.   On: 01/23/2019 02:35   MRI brain without contrast 03/03/2020: 1. Scattered multifocal subcentimeter acute ischemic infarcts involving the right cerebral hemisphere as above. Few additional subcentimeter infarcts involve the left occipital lobe and inferior right cerebellum. No associated hemorrhage or mass effect. 2. Small chronic infarct involving the right middle cerebellar peduncle, chronic in appearance, but new as compared to previous. 3. Underlying mild  to moderate chronic microvascular ischemic disease, mildly progressed from previous.   CTA neck with and without contrast: 04/15/2020. 1. Acute proximal right ICA occlusion, new from previous. Distal reconstitution at the supraclinoid segment via collateral flow across the circle-of-Willis. Right MCA is perfused although attenuated as compared to the contralateral left. 2. Otherwise stable CTA of the head and neck. No other large vessel occlusion, hemodynamically significant stenosis, or other acute vascular abnormality. 3. 4 mm left upper lobe nodule, indeterminate. No follow-up needed if patient is low-risk. Non-contrast chest CT can be considered in 12 months if patient is high-risk. This recommendation follows the consensus statement: Guidelines for Management of Incidental Pulmonary Nodules Detected on CT Images: From the  Fleischner Society 2017; Radiology 2017; 284:228-243.  MRI brain with and without contrast 04/15/2020: Multiple new acute infarcts in the right cerebral hemisphere. Small acute infarct of the right pons.  CARDIAC DATABASE: EKG 05/21/2023: Sinus rhythm, 94 bpm, poor R wave progression, low voltage in the precordial leads, without underlying ischemia injury pattern.  Echocardiogram: 05/15/2023:  Left ventricle cavity is normal in size. Mild concentric hypertrophy of the left ventricle. Normal global wall motion. Normal LV systolic function with EF 67%. Doppler evidence of grade I (impaired) diastolic dysfunction, normal LAP.  Left atrial cavity is moderately dilated.  Mild (Grade I) mitral regurgitation.  Mild tricuspid regurgitation.  Mild pulmonic regurgitation.  No evidence of pulmonary hypertension.  Previous study on 05/30/2022 reported moderate to severe LA dilatation, mod TR.   Stress Testing: Lexiscan Sestamibi stress test 12/10/2020: Abnormal nuclear stress test: 1 Day Rest/Stress Protocol. Stress EKG is non-diagnostic, as this is pharmacological stress test using Lexiscan. Medium size, moderate intensity, reversible perfusion defect suggestive of ischemia in the LCx distribution. Small size, moderate intensity, reversible perfusion defect suggestive of ischemia in the LAD distribution. Small size, fixed perfusion defect involving the basal to mid inferior segments most likely secondary to soft tissue attenuation artifact with possible small degree of reversible ischemia involving the basal to mid inferoseptal segments cannot be ruled out. Calculated LVEF 64% without regional wall motion abnormalities. Clinical correlation required.  Heart Catheterization: None   Zio Patch Extended out patient EKG monitoring 14 days starting 10/10/2020: Predominant rhythm is normal sinus rhythm.  Minimum heart rate 67 and maximum heart rate 160 bpm with an average heart rate of 98 bpm. There were  occasional PACs and atrial couplets.  There were rare PVCs.  There was no atrial fibrillation. There was no heart block, no symptomatic events recorded  Loop recorder transmission 05/19/2023: Normal sinus rhythm.  No symptoms reported.   Carotid artery duplex 01/23/2023:  Duplex suggests stenosis in the right internal carotid artery (total  occlusion). <50%stenosis in the right external carotid artery.  Duplex suggests stenosis in the left internal carotid artery (minimal).  Homogeneous plaque bilateral carotid arteries.  Follow up in four months is appropriate if clinically indicated.  Antegrade right vertebral artery flow. Antegrade left vertebral artery flow.  No significant change from 01/22/2021. Follow up in 1 year months is  appropriate if clinically indicated.    LABORATORY DATA:    Latest Ref Rng & Units 05/19/2023    1:48 PM 04/23/2020   11:25 AM 04/15/2020    4:52 PM  CBC  WBC 4.0 - 10.5 K/uL 6.9  7.5  8.6   Hemoglobin 12.0 - 15.0 g/dL 9.9  62.9  52.8   Hematocrit 36.0 - 46.0 % 31.8  43.7  43.2   Platelets 150 - 400 K/uL  242  199  214        Latest Ref Rng & Units 05/19/2023    1:48 PM 04/23/2020   11:25 AM 04/15/2020    4:52 PM  CMP  Glucose 70 - 99 mg/dL 130  865  784   BUN 6 - 20 mg/dL 40  7  7   Creatinine 6.96 - 1.00 mg/dL 2.95  2.84  1.32   Sodium 135 - 145 mmol/L 138  133  135   Potassium 3.5 - 5.1 mmol/L 3.6  3.9  3.7   Chloride 98 - 111 mmol/L 103  101  100   CO2 22 - 32 mmol/L 22  21  22    Calcium 8.9 - 10.3 mg/dL 9.6  9.5  9.7   Total Protein 6.5 - 8.1 g/dL 7.3  7.1  8.2   Total Bilirubin 0.3 - 1.2 mg/dL 0.4  0.7  0.8   Alkaline Phos 38 - 126 U/L 61  74  117   AST 15 - 41 U/L 52  23  24   ALT 0 - 44 U/L 41  21  20     Lipid Panel  Lab Results  Component Value Date   CHOL 258 (H) 03/04/2020   HDL 29 (L) 03/04/2020   LDLCALC 202 (H) 03/04/2020   TRIG 133 03/04/2020   CHOLHDL 8.9 03/04/2020   BMP Recent Labs    05/19/23 1348  NA 138  K 3.6   CL 103  CO2 22  GLUCOSE 111*  BUN 40*  CREATININE 2.93*  CALCIUM 9.6  GFRNONAA 20*     HEMOGLOBIN A1C Lab Results  Component Value Date   HGBA1C 10.7 (H) 03/04/2020   MPG 260.39 03/04/2020    External Labs:  Collected: 11/28/2020 provided by PCP during the office visit Creatinine 0.81 mg/dL. eGFR: 105 mL/min per 1.73 m Lipid profile: Total cholesterol 122, triglycerides 91, HDL 34, LDL 71, non-HDL 88 Sodium 440, potassium 4.6, chloride 103, bicarbonate 24, AST 18, ALT 35, alkaline phosphatase 76 Hemoglobin 12.3 g/dL, hematocrit 10.2% Hemoglobin A1c: 8  IMPRESSION:    ICD-10-CM   1. Loop recorder [employer] 12/14/2020  Z95.818     2. TIA (transient ischemic attack)  G45.9     3. Hx of  stroke  Z86.73     4. Hx of right proximal ICA occlusion  I63.231 VAS US CAROTID    5. Type 2 diabetes mellitus with hyperglycemia, without long-term current use of insulin (HCC)  E11.65     6. Essential hypertension, benign  I10 EKG 12-Lead    7. Hypercholesteremia  E78.00     8. Former smoker  Z87.891        RECOMMENDATIONS: Kruthi Bondoc is a 42 y.o. female whose past medical history and cardiac risk factors include: Cryptogenic stroke w/ left-sided deficits, diabetes mellitus, hypertension, back pain, smoker, marijuana and cocaine use.  Loop recorder [employer] 12/14/2020 TIA (transient ischemic attack) Hx of  stroke Hx of right proximal ICA occlusion Has had at least 2 strokes in the past the initial stroke was thought to be cryptogenic etiology and second event due to ICA occlusion.  Since then she has undergone loop recorder implant which has not documented atrial fibrillation. Last carotid duplex was performed in April 2024.  Results reviewed and noted above.   Repeat duplex scheduled for May 2025. Continue antiplatelet and statin therapy. I do not have the most recent lipid profile for review.  Patient states that it was checked by  PCP.  I have asked her to send [place]  a copy for reference.  Would recommend a goal LDL <55 mg/dL. Reemphasized the importance of secondary prevention with focus on improving her modifiable cardiovascular risk factors such as glycemic control, lipid management, blood pressure control, weight loss.  Essential hypertension, benign Office blood pressures are within acceptable limits. Medications reconciled.  Type 2 diabetes mellitus with hyperglycemia, without long-term current use of insulin (HCC) Reemphasized importance of glycemic control. Currently on both insulin and antiglycemic agents. Currently managed by primary care provider.  Hypercholesteremia Currently on atorvastatin.   She denies myalgia or other side effects. Currently managed by primary care provider.  FINAL MEDICATION LIST END OF ENCOUNTER: No orders of the defined types were placed in this encounter.    Current Outpatient Medications:    atorvastatin (LIPITOR) 40 MG tablet, Take 40 mg by mouth at bedtime., Disp: , Rfl:    BD PEN NEEDLE MICRO U/F 32G X 6 MM MISC, Inject into the skin as directed., Disp: , Rfl:    cephALEXin (KEFLEX) 500 MG capsule, Take 1 capsule (500 mg total) by mouth 4 (four) times daily., Disp: 28 capsule, Rfl: 0   clopidogrel (PLAVIX) 75 MG tablet, Take 1 tablet (75 mg total) by mouth daily., Disp: 30 tablet, Rfl: 0   Empagliflozin (JARDIANCE PO), Take 10 mg by mouth at bedtime., Disp: , Rfl:    ergocalciferol (VITAMIN D2) 1.25 MG (50000 UT) capsule, Take 50,000 Units by mouth once a week., Disp: , Rfl:    LANTUS SOLOSTAR 100 UNIT/ML Solostar Pen, Inject 12 Units into the skin daily., Disp: , Rfl:    metFORMIN (GLUCOPHAGE) 1000 MG tablet, Take 1 tablet (1,000 mg total) by mouth 2 (two) times daily., Disp: 60 tablet, Rfl: 0   metoprolol succinate (TOPROL-XL) 25 MG 24 hr tablet, TAKE 1 TABLET BY MOUTH ONCE DAILY. HOLD IF BLOOD PRESSURE NUMBER LESS THAN OR HEART RATE LESS THAN 60 BPM(PULSE), Disp: 90 tablet, Rfl: 3   nitroGLYCERIN  (NITROSTAT) 0.4 MG SL tablet, Place 1 tablet (0.4 mg total) under the tongue every 5 (five) minutes as needed for chest pain. If you require more than two tablets five minutes apart go to the nearest ER via EMS., Disp: 30 tablet, Rfl: 0  Orders Placed This Encounter  Procedures   EKG 12-Lead   VAS US CAROTID    There are no Patient Instructions on file for this visit.   --Continue cardiac medications as reconciled in final medication list. --Return in about 9 months (around 02/18/2024) for History of stroke, carotid disease, status post loop implant for A-fib detection. Or sooner if needed. --Continue follow-up with your primary care physician regarding the management of your other chronic comorbid conditions.  Patient's questions and concerns were addressed to her satisfaction. She voices understanding of the instructions provided during this encounter.   This note was created using a voice recognition software as a result there may be grammatical errors inadvertently enclosed that do not reflect the nature of this encounter. Every attempt is made to correct such errors.  Tessa Lerner, [place], [facility]  Pager:  [phone] Office: [phone]

## 2023-05-22 ENCOUNTER — Telehealth (HOSPITAL_BASED_OUTPATIENT_CLINIC_OR_DEPARTMENT_OTHER): Payer: Self-pay

## 2023-05-22 NOTE — Telephone Encounter (Signed)
Post ED Visit - Positive Culture Follow-up  Culture report reviewed by antimicrobial stewardship pharmacist: [facility] [x]  Lora Paula, Pharm.D. []  Celedonio Miyamoto, Pharm.D., BCPS AQ-ID []  Garvin Fila, Pharm.D., BCPS []  Georgina Pillion, [address].D., BCPS []  [place], [address].D., BCPS, AAHIVP []  Estella Husk, Pharm.D., BCPS, AAHIVP []  Lysle Pearl, PharmD, BCPS []  Phillips Climes, PharmD, BCPS []  Agapito Games, PharmD, BCPS []  Verlan Friends, PharmD []  Mervyn Gay, PharmD, BCPS []  Vinnie Level, PharmD  [facility] []  Len Childs, PharmD []  Greer Pickerel, PharmD []  Adalberto Cole, PharmD []  Perlie Gold, Rph []  Lonell Face) Jean Rosenthal, PharmD []  Earl Many, PharmD []  Junita Push, PharmD []  Dorna Leitz, PharmD []  Terrilee Files, PharmD []  Lynann Beaver, PharmD []  Keturah Barre, PharmD []  Loralee Pacas, PharmD []  Bernadene Person, PharmD   Positive Urine culture Treated with Cephalexin, organism sensitive to the same and no further patient follow-up is required at this time.  Sandria Senter 05/22/2023, 9:03 AM

## 2023-05-27 ENCOUNTER — Encounter: Payer: Self-pay | Admitting: Cardiology

## 2023-06-11 ENCOUNTER — Other Ambulatory Visit: Payer: Self-pay | Admitting: Cardiology

## 2023-06-11 DIAGNOSIS — I63231 Cerebral infarction due to unspecified occlusion or stenosis of right carotid arteries: Secondary | ICD-10-CM

## 2023-06-11 DIAGNOSIS — G459 Transient cerebral ischemic attack, unspecified: Secondary | ICD-10-CM

## 2023-06-11 DIAGNOSIS — E781 Pure hyperglyceridemia: Secondary | ICD-10-CM

## 2023-06-11 DIAGNOSIS — Z8673 Personal history of transient ischemic attack (TIA), and cerebral infarction without residual deficits: Secondary | ICD-10-CM

## 2023-06-11 DIAGNOSIS — E1165 Type 2 diabetes mellitus with hyperglycemia: Secondary | ICD-10-CM

## 2023-06-11 MED ORDER — FENOFIBRATE 145 MG PO TABS
145.0000 mg | ORAL_TABLET | Freq: Every day | ORAL | 0 refills | Status: DC
Start: 2023-06-11 — End: 2023-11-14

## 2023-06-11 NOTE — Progress Notes (Signed)
Called pt she voiced understanding she is fine with starting fenofibrate

## 2023-06-12 NOTE — Progress Notes (Signed)
Patient made aware.

## 2023-07-17 ENCOUNTER — Ambulatory Visit (INDEPENDENT_AMBULATORY_CARE_PROVIDER_SITE_OTHER): Payer: 59

## 2023-07-17 DIAGNOSIS — Z8673 Personal history of transient ischemic attack (TIA), and cerebral infarction without residual deficits: Secondary | ICD-10-CM | POA: Diagnosis not present

## 2023-07-17 LAB — CUP PACEART REMOTE DEVICE CHECK
Date Time Interrogation Session: 20241018065859
Implantable Pulse Generator Implant Date: 20220318
Pulse Gen Model: 436066
Pulse Gen Serial Number: 94060471

## 2023-07-31 NOTE — Progress Notes (Signed)
Carelink Summary Report / Loop Recorder 

## 2023-08-03 ENCOUNTER — Other Ambulatory Visit: Payer: Self-pay | Admitting: *Deleted

## 2023-08-19 ENCOUNTER — Ambulatory Visit (INDEPENDENT_AMBULATORY_CARE_PROVIDER_SITE_OTHER): Payer: 59

## 2023-08-19 DIAGNOSIS — Z8673 Personal history of transient ischemic attack (TIA), and cerebral infarction without residual deficits: Secondary | ICD-10-CM

## 2023-08-19 LAB — CUP PACEART REMOTE DEVICE CHECK
Date Time Interrogation Session: 20241120072532
Implantable Pulse Generator Implant Date: 20220318
Pulse Gen Model: 436066
Pulse Gen Serial Number: 94060471

## 2023-08-24 NOTE — Telephone Encounter (Signed)
LVM for pt to call the office back regarding needing to complete fasting lipids after starting Fenofibrate per Dr. Emelda Brothers request.

## 2023-09-15 NOTE — Progress Notes (Signed)
[employer] Loop Recorder 

## 2023-10-26 ENCOUNTER — Ambulatory Visit (INDEPENDENT_AMBULATORY_CARE_PROVIDER_SITE_OTHER): Payer: 59

## 2023-10-26 DIAGNOSIS — G459 Transient cerebral ischemic attack, unspecified: Secondary | ICD-10-CM

## 2023-10-26 LAB — CUP PACEART REMOTE DEVICE CHECK
Date Time Interrogation Session: 20250127110603
Implantable Pulse Generator Implant Date: 20220318
Pulse Gen Model: 436066
Pulse Gen Serial Number: 94060471

## 2023-11-01 ENCOUNTER — Other Ambulatory Visit: Payer: Self-pay

## 2023-11-01 ENCOUNTER — Emergency Department (HOSPITAL_COMMUNITY)
Admission: EM | Admit: 2023-11-01 | Discharge: 2023-11-01 | Disposition: A | Discharge status: Home / Self Care | Payer: 59 | Attending: Emergency Medicine | Admitting: Emergency Medicine

## 2023-11-01 ENCOUNTER — Encounter (HOSPITAL_COMMUNITY): Payer: Self-pay

## 2023-11-01 ENCOUNTER — Emergency Department (HOSPITAL_COMMUNITY): Payer: 59

## 2023-11-01 DIAGNOSIS — R531 Weakness: Secondary | ICD-10-CM | POA: Diagnosis present

## 2023-11-01 DIAGNOSIS — E876 Hypokalemia: Secondary | ICD-10-CM | POA: Insufficient documentation

## 2023-11-01 DIAGNOSIS — Z7984 Long term (current) use of oral hypoglycemic drugs: Secondary | ICD-10-CM | POA: Diagnosis not present

## 2023-11-01 DIAGNOSIS — Z8673 Personal history of transient ischemic attack (TIA), and cerebral infarction without residual deficits: Secondary | ICD-10-CM | POA: Diagnosis not present

## 2023-11-01 DIAGNOSIS — Z79899 Other long term (current) drug therapy: Secondary | ICD-10-CM | POA: Insufficient documentation

## 2023-11-01 DIAGNOSIS — I10 Essential (primary) hypertension: Secondary | ICD-10-CM | POA: Diagnosis not present

## 2023-11-01 DIAGNOSIS — W010XXA Fall on same level from slipping, tripping and stumbling without subsequent striking against object, initial encounter: Secondary | ICD-10-CM | POA: Diagnosis not present

## 2023-11-01 DIAGNOSIS — W19XXXA Unspecified fall, initial encounter: Secondary | ICD-10-CM

## 2023-11-01 DIAGNOSIS — R7401 Elevation of levels of liver transaminase levels: Secondary | ICD-10-CM | POA: Diagnosis not present

## 2023-11-01 DIAGNOSIS — E119 Type 2 diabetes mellitus without complications: Secondary | ICD-10-CM | POA: Insufficient documentation

## 2023-11-01 LAB — CBC WITH DIFFERENTIAL/PLATELET
Abs Immature Granulocytes: 0.02 10*3/uL (ref 0.00–0.07)
Basophils Absolute: 0 10*3/uL (ref 0.0–0.1)
Basophils Relative: 1 %
Eosinophils Absolute: 0.1 10*3/uL (ref 0.0–0.5)
Eosinophils Relative: 2 %
HCT: 37.2 % (ref 36.0–46.0)
Hemoglobin: 11.7 g/dL — ABNORMAL LOW (ref 12.0–15.0)
Immature Granulocytes: 0 %
Lymphocytes Relative: 29 %
Lymphs Abs: 1.9 10*3/uL (ref 0.7–4.0)
MCH: 26.8 pg (ref 26.0–34.0)
MCHC: 31.5 g/dL (ref 30.0–36.0)
MCV: 85.1 fL (ref 80.0–100.0)
Monocytes Absolute: 0.6 10*3/uL (ref 0.1–1.0)
Monocytes Relative: 10 %
Neutro Abs: 3.9 10*3/uL (ref 1.7–7.7)
Neutrophils Relative %: 58 %
Platelets: 251 10*3/uL (ref 150–400)
RBC: 4.37 MIL/uL (ref 3.87–5.11)
RDW: 16.9 % — ABNORMAL HIGH (ref 11.5–15.5)
WBC: 6.5 10*3/uL (ref 4.0–10.5)
nRBC: 0 % (ref 0.0–0.2)

## 2023-11-01 LAB — COMPREHENSIVE METABOLIC PANEL
ALT: 152 U/L — ABNORMAL HIGH (ref 0–44)
AST: 224 U/L — ABNORMAL HIGH (ref 15–41)
Albumin: 3.7 g/dL (ref 3.5–5.0)
Alkaline Phosphatase: 55 U/L (ref 38–126)
Anion gap: 10 (ref 5–15)
BUN: 22 mg/dL — ABNORMAL HIGH (ref 6–20)
CO2: 20 mmol/L — ABNORMAL LOW (ref 22–32)
Calcium: 9.3 mg/dL (ref 8.9–10.3)
Chloride: 111 mmol/L (ref 98–111)
Creatinine, Ser: 2.18 mg/dL — ABNORMAL HIGH (ref 0.44–1.00)
GFR, Estimated: 28 mL/min — ABNORMAL LOW (ref >60)
Glucose, Bld: 96 mg/dL (ref 70–99)
Potassium: 3.4 mmol/L — ABNORMAL LOW (ref 3.5–5.1)
Sodium: 141 mmol/L (ref 135–145)
Total Bilirubin: 0.7 mg/dL (ref 0.0–1.2)
Total Protein: 7.2 g/dL (ref 6.5–8.1)

## 2023-11-01 LAB — URINALYSIS, ROUTINE W REFLEX MICROSCOPIC
Bacteria, UA: NONE SEEN
Bilirubin Urine: NEGATIVE
Glucose, UA: >=500 mg/dL — AB
Ketones, ur: NEGATIVE mg/dL
Leukocytes,Ua: NEGATIVE
Nitrite: NEGATIVE
Protein, ur: 100 mg/dL — AB
Specific Gravity, Urine: 1.009 (ref 1.005–1.030)
pH: 7 (ref 5.0–8.0)

## 2023-11-01 MED ORDER — POTASSIUM CHLORIDE 20 MEQ PO PACK
20.0000 meq | PACK | Freq: Once | ORAL | Status: AC
  Administered 2023-11-01: 20 meq via ORAL
  Filled 2023-11-01: qty 1

## 2023-11-01 NOTE — ED Triage Notes (Signed)
Patient reports she slipped and fell this AM. Since Bil lower legs have been swollen. Denies hitting head, no LOC, does not take blood thinners.

## 2023-11-01 NOTE — ED Provider Notes (Signed)
[place] EMERGENCY DEPARTMENT AT [facility] Provider Note   CSN: [id number] Arrival date & time: 11/01/23  1240     History  Chief Complaint  Patient presents with   Marletta Lor    Ebony Matthews is a 43 y.o. female with history of stroke with left-sided weakness, hypertension, type 2 diabetes, presents with concern for a fall that occurred this morning.  States she was try to get to the bathroom and her socks slipped from underneath her causing her to fall onto the floor.  She denies any loss of consciousness, did not hit her head.  Denies feeling any chest pain or shortness of breath before the fall.  Patient states she is not on any blood thinners.  States she has felt generally more weak lately.  Denies any dysuria, hematuria, increased frequency.  Denies any cough, fever or chills.   Fall       Home Medications Prior to Admission medications   Medication Sig Start Date End Date Taking? Authorizing Provider  atorvastatin (LIPITOR) 40 MG tablet Take 40 mg by mouth at bedtime.    [provider]  BD PEN NEEDLE MICRO U/F 32G X 6 MM MISC Inject into the skin as directed. 08/13/22   [provider]  cephALEXin (KEFLEX) 500 MG capsule Take 1 capsule (500 mg total) by mouth 4 (four) times daily. 05/19/23   Benjiman Core, MD  clopidogrel (PLAVIX) 75 MG tablet Take 1 tablet (75 mg total) by mouth daily. 09/25/20   Drema Dallas, DO  Empagliflozin (JARDIANCE PO) Take 10 mg by mouth at bedtime.    [provider]  ergocalciferol (VITAMIN D2) 1.25 MG (50000 UT) capsule Take 50,000 Units by mouth once a week.    [provider]  fenofibrate (TRICOR) 145 MG tablet Take 1 tablet (145 mg total) by mouth daily. 06/11/23 09/09/23  Tolia, Sunit, DO  LANTUS SOLOSTAR 100 UNIT/ML Solostar Pen Inject 12 Units into the skin daily. 08/13/22   [provider]  metFORMIN (GLUCOPHAGE) 1000 MG tablet Take 1 tablet (1,000 mg total) by mouth 2 (two)  times daily. 03/04/20   Lurene Shadow, MD  metoprolol succinate (TOPROL-XL) 25 MG 24 hr tablet TAKE 1 TABLET BY MOUTH ONCE DAILY. HOLD IF BLOOD PRESSURE NUMBER LESS THAN OR HEART RATE LESS THAN 60 BPM(PULSE) 10/02/22   Tolia, Sunit, DO  nitroGLYCERIN (NITROSTAT) 0.4 MG SL tablet Place 1 tablet (0.4 mg total) under the tongue every 5 (five) minutes as needed for chest pain. If you require more than two tablets five minutes apart go to the nearest ER via EMS. 12/21/20 05/21/23  Tessa Lerner, DO      Allergies    Patient has no known allergies.    Review of Systems   Review of Systems  Constitutional:  Negative for fever.    Physical Exam Updated Vital Signs BP (!) 165/99   Pulse 91   Temp 98.3 F (36.8 C) (Oral)   Resp 18   Ht 5\' 7"  (1.702 m)   Wt 94.3 kg   SpO2 100%   BMI 32.56 kg/m  Physical Exam Vitals and nursing note reviewed.  Constitutional:      General: She is not in acute distress.    Appearance: She is well-developed.  HENT:     Head: Normocephalic and atraumatic.  Eyes:     Extraocular Movements: Extraocular movements intact.     Conjunctiva/sclera: Conjunctivae normal.     Pupils: Pupils are equal, round, and  reactive to light.  Cardiovascular:     Rate and Rhythm: Normal rate and regular rhythm.     Heart sounds: No murmur heard. Pulmonary:     Effort: Pulmonary effort is normal. No respiratory distress.     Breath sounds: Normal breath sounds.     Comments: Breathing comfortably on room air, talking in full sentences Abdominal:     Palpations: Abdomen is soft.     Tenderness: There is no abdominal tenderness.  Musculoskeletal:        General: No swelling.     Cervical back: Neck supple.     Comments: More difficulty moving left lower extremity compared to right lower extremity due to left-sided deficits from stroke.  Patient states she is at her baseline today as far as range of motion.  No tenderness to palpation diffusely of the upper or lower  extremities bilaterally.  No spinal tenderness palpation.  Able to rotate neck left and right 45 degrees without difficulty  Skin:    General: Skin is warm and dry.     Capillary Refill: Capillary refill takes less than 2 seconds.  Neurological:     General: No focal deficit present.     Mental Status: She is alert.  Psychiatric:        Mood and Affect: Mood normal.     ED Results / Procedures / Treatments   Labs (all labs ordered are listed, but only abnormal results are displayed) Labs Reviewed  CBC WITH DIFFERENTIAL/PLATELET - Abnormal; Notable for the following components:      Result Value   Hemoglobin 11.7 (*)    RDW 16.9 (*)    All other components within normal limits  COMPREHENSIVE METABOLIC PANEL - Abnormal; Notable for the following components:   Potassium 3.4 (*)    CO2 20 (*)    BUN 22 (*)    Creatinine, Ser 2.18 (*)    AST 224 (*)    ALT 152 (*)    GFR, Estimated 28 (*)    All other components within normal limits  URINALYSIS, ROUTINE W REFLEX MICROSCOPIC - Abnormal; Notable for the following components:   Glucose, UA >=500 (*)    Hgb urine dipstick LARGE (*)    Protein, ur 100 (*)    All other components within normal limits    EKG None  Radiology US Abdomen Limited RUQ (LIVER/GB) Result Date: 11/01/2023 CLINICAL DATA:  Elevated liver function studies. EXAM: ULTRASOUND ABDOMEN LIMITED RIGHT UPPER QUADRANT COMPARISON:  CT 05/19/2023 FINDINGS: Gallbladder: No gallstones or wall thickening visualized. No sonographic Murphy sign noted by sonographer. Common bile duct: Diameter: 4 mm, normal Liver: No focal lesion identified. Within normal limits in parenchymal echogenicity. Portal vein is patent on color Doppler imaging with normal direction of blood flow towards the liver. Other: None. IMPRESSION: Normal examination. No evidence of cholelithiasis or acute cholecystitis. Electronically Signed   By: Burman Nieves M.D.   On: 11/01/2023 18:03     Procedures Procedures    Medications Ordered in ED Medications  potassium chloride (KLOR-CON) packet 20 mEq (has no administration in time range)    ED Course/ Medical Decision Making/ A&P                                 Medical Decision Making Amount and/or Complexity of Data Reviewed Labs: ordered. Radiology: ordered.     Differential diagnosis includes but is not limited to mechanical  fall, orthostatic hypotension, syncope  ED Course:  Patient well-appearing, stable vital signs aside from a elevated blood pressure of 159/94 upon arrival.  States she has felt more weak lately and had a fall today in the bathroom.  She has a good story for mechanical fall, states her socks slipped out from underneath her.  She denies any loss consciousness, did not hit her head.  No indication for head imaging at this time.  Nontender to palpation of the upper and lower extremities diffusely, baseline range of motion, no indication for x-ray imaging at this time.  Patient also states she does not want x-ray imaging at this time. I Ordered, and personally interpreted labs.  The pertinent results include: CBC without leukocytosis, slight low hemoglobin at 11.7 which is improved from most recent lab taken 5 months ago CMP with slight hypokalemia at 3.4, elevated creatinine at 2.18 which appears to be her baseline.  Elevated AST and ALT at 224 and 152 respectively.  No elevation in alk phos or T. bili. Urinalysis without any nitrites or leukocytes Although patient states she has felt more weak recently, no electrolyte abnormalities or significant anemia to explain her weakness.  She was given oral KCl for her hypokalemia of 3.4.  Given she had an elevated AST and ALT, obtained right upper quadrant ultrasound which showed no acute abnormalities.  She does state she takes Tylenol for pain, but has not changed her dose of this medication.  Reports she does not drink alcohol.  Unclear as to the etiology  of these LFT elevations, but patient without any abdominal pain, no nausea or vomiting.  Do not feel like this is an emergent concern at this time.  I discussed this case with Dr. Rodena Medin who feels patient is stable and appropriate for discharge home this time with close follow-up with PCP.  Impression: Mechanical fall Hypokalemia  Disposition:  The patient was discharged home with instructions to follow-up with PCP within the next week for repeat of her AST and ALT and discussion of blood pressure management. Return precautions given.  Imaging Studies ordered: I ordered imaging studies including right upper quadrant ultrasound I independently visualized the imaging with scope of interpretation limited to determining acute life threatening conditions related to emergency care. Imaging showed no acute abnormalities I agree with the radiologist interpretation                Final Clinical Impression(s) / ED Diagnoses Final diagnoses:  Fall, initial encounter  Hypokalemia    Rx / DC Orders ED Discharge Orders     None         Arabella Merles, Cordelia Poche 11/01/23 1823    Wynetta Fines, MD 11/01/23 2242

## 2023-11-01 NOTE — Discharge Instructions (Addendum)
Your urine did not show any signs of infection today  Your liver enzymes, AST and ALT, were elevated today.  The ultrasound of your liver and gallbladder did not show any abnormalities.  Please have these lab values repeated with your PCP within the next week.  Your blood pressure was also elevated today (159/94).  Please discuss this with your primary when you follow-up this week.  You had a slightly low potassium here.  You were given a potassium supplement.  Please increase your dietary intake of potassium at home with foods such as bananas, avocados, potatoes.  Your blood counts are all at your baseline.  Please return the ER if you have any severe headache, changes in vision, dizziness, nausea or vomiting, severe abdominal pain, any other new or concerning symptoms.

## 2023-11-01 NOTE — ED Notes (Signed)
Pt to bathroom via wc with assistance

## 2023-11-06 ENCOUNTER — Inpatient Hospital Stay (HOSPITAL_COMMUNITY)
Admission: EM | Admit: 2023-11-06 | Discharge: 2023-11-14 | DRG: 683 | Disposition: A | Discharge status: Skilled Nursing Facility | Payer: 59 | Attending: Internal Medicine | Admitting: Internal Medicine

## 2023-11-06 ENCOUNTER — Observation Stay (HOSPITAL_COMMUNITY): Payer: 59

## 2023-11-06 ENCOUNTER — Emergency Department (HOSPITAL_COMMUNITY): Payer: 59

## 2023-11-06 DIAGNOSIS — L680 Hirsutism: Secondary | ICD-10-CM | POA: Diagnosis present

## 2023-11-06 DIAGNOSIS — R7401 Elevation of levels of liver transaminase levels: Secondary | ICD-10-CM

## 2023-11-06 DIAGNOSIS — Z794 Long term (current) use of insulin: Secondary | ICD-10-CM

## 2023-11-06 DIAGNOSIS — R81 Glycosuria: Secondary | ICD-10-CM | POA: Diagnosis present

## 2023-11-06 DIAGNOSIS — Z79899 Other long term (current) drug therapy: Secondary | ICD-10-CM

## 2023-11-06 DIAGNOSIS — E785 Hyperlipidemia, unspecified: Secondary | ICD-10-CM | POA: Diagnosis present

## 2023-11-06 DIAGNOSIS — N184 Chronic kidney disease, stage 4 (severe): Principal | ICD-10-CM | POA: Diagnosis present

## 2023-11-06 DIAGNOSIS — M5126 Other intervertebral disc displacement, lumbar region: Secondary | ICD-10-CM | POA: Diagnosis present

## 2023-11-06 DIAGNOSIS — R809 Proteinuria, unspecified: Secondary | ICD-10-CM | POA: Diagnosis present

## 2023-11-06 DIAGNOSIS — Z8673 Personal history of transient ischemic attack (TIA), and cerebral infarction without residual deficits: Secondary | ICD-10-CM

## 2023-11-06 DIAGNOSIS — R1900 Intra-abdominal and pelvic swelling, mass and lump, unspecified site: Secondary | ICD-10-CM

## 2023-11-06 DIAGNOSIS — I129 Hypertensive chronic kidney disease with stage 1 through stage 4 chronic kidney disease, or unspecified chronic kidney disease: Secondary | ICD-10-CM | POA: Diagnosis present

## 2023-11-06 DIAGNOSIS — R531 Weakness: Principal | ICD-10-CM

## 2023-11-06 DIAGNOSIS — N179 Acute kidney failure, unspecified: Secondary | ICD-10-CM | POA: Diagnosis not present

## 2023-11-06 DIAGNOSIS — E1122 Type 2 diabetes mellitus with diabetic chronic kidney disease: Secondary | ICD-10-CM | POA: Diagnosis present

## 2023-11-06 DIAGNOSIS — E872 Acidosis, unspecified: Secondary | ICD-10-CM

## 2023-11-06 DIAGNOSIS — Z9181 History of falling: Secondary | ICD-10-CM

## 2023-11-06 DIAGNOSIS — Z833 Family history of diabetes mellitus: Secondary | ICD-10-CM

## 2023-11-06 DIAGNOSIS — E1165 Type 2 diabetes mellitus with hyperglycemia: Secondary | ICD-10-CM | POA: Diagnosis present

## 2023-11-06 DIAGNOSIS — M6282 Rhabdomyolysis: Secondary | ICD-10-CM | POA: Diagnosis present

## 2023-11-06 DIAGNOSIS — I10 Essential (primary) hypertension: Secondary | ICD-10-CM | POA: Diagnosis present

## 2023-11-06 DIAGNOSIS — I6521 Occlusion and stenosis of right carotid artery: Secondary | ICD-10-CM | POA: Diagnosis present

## 2023-11-06 DIAGNOSIS — D631 Anemia in chronic kidney disease: Secondary | ICD-10-CM | POA: Diagnosis present

## 2023-11-06 DIAGNOSIS — I69392 Facial weakness following cerebral infarction: Secondary | ICD-10-CM

## 2023-11-06 DIAGNOSIS — E66811 Obesity, class 1: Secondary | ICD-10-CM | POA: Diagnosis present

## 2023-11-06 DIAGNOSIS — I69354 Hemiplegia and hemiparesis following cerebral infarction affecting left non-dominant side: Secondary | ICD-10-CM

## 2023-11-06 DIAGNOSIS — I639 Cerebral infarction, unspecified: Secondary | ICD-10-CM

## 2023-11-06 DIAGNOSIS — Z7902 Long term (current) use of antithrombotics/antiplatelets: Secondary | ICD-10-CM

## 2023-11-06 DIAGNOSIS — Z8249 Family history of ischemic heart disease and other diseases of the circulatory system: Secondary | ICD-10-CM

## 2023-11-06 DIAGNOSIS — Z6833 Body mass index (BMI) 33.0-33.9, adult: Secondary | ICD-10-CM

## 2023-11-06 DIAGNOSIS — Z87891 Personal history of nicotine dependence: Secondary | ICD-10-CM

## 2023-11-06 DIAGNOSIS — Z7982 Long term (current) use of aspirin: Secondary | ICD-10-CM

## 2023-11-06 DIAGNOSIS — Z7984 Long term (current) use of oral hypoglycemic drugs: Secondary | ICD-10-CM

## 2023-11-06 DIAGNOSIS — L659 Nonscarring hair loss, unspecified: Secondary | ICD-10-CM | POA: Diagnosis present

## 2023-11-06 DIAGNOSIS — F1411 Cocaine abuse, in remission: Secondary | ICD-10-CM | POA: Diagnosis present

## 2023-11-06 LAB — I-STAT CHEM 8, ED
BUN: 40 mg/dL — ABNORMAL HIGH (ref 6–20)
Calcium, Ion: 1.11 mmol/L — ABNORMAL LOW (ref 1.15–1.40)
Chloride: 111 mmol/L (ref 98–111)
Creatinine, Ser: 4.5 mg/dL — ABNORMAL HIGH (ref 0.44–1.00)
Glucose, Bld: 139 mg/dL — ABNORMAL HIGH (ref 70–99)
HCT: 36 % (ref 36.0–46.0)
Hemoglobin: 12.2 g/dL (ref 12.0–15.0)
Potassium: 4.2 mmol/L (ref 3.5–5.1)
Sodium: 141 mmol/L (ref 135–145)
TCO2: 20 mmol/L — ABNORMAL LOW (ref 22–32)

## 2023-11-06 LAB — VITAMIN B12: Vitamin B-12: 111 pg/mL — ABNORMAL LOW (ref 180–914)

## 2023-11-06 LAB — PROTIME-INR
INR: 1.1 (ref 0.8–1.2)
Prothrombin Time: 13.9 s (ref 11.4–15.2)

## 2023-11-06 LAB — PLATELET COUNT: Platelets: 259 10*3/uL (ref 150–400)

## 2023-11-06 LAB — CBC
HCT: 37.5 % (ref 36.0–46.0)
Hemoglobin: 11.6 g/dL — ABNORMAL LOW (ref 12.0–15.0)
MCH: 26.7 pg (ref 26.0–34.0)
MCHC: 30.9 g/dL (ref 30.0–36.0)
MCV: 86.2 fL (ref 80.0–100.0)
Platelets: UNDETERMINED 10*3/uL (ref 150–400)
RBC: 4.35 MIL/uL (ref 3.87–5.11)
RDW: 17.4 % — ABNORMAL HIGH (ref 11.5–15.5)
WBC: 6 10*3/uL (ref 4.0–10.5)
nRBC: 0 % (ref 0.0–0.2)

## 2023-11-06 LAB — COMPREHENSIVE METABOLIC PANEL
ALT: 255 U/L — ABNORMAL HIGH (ref 0–44)
AST: 381 U/L — ABNORMAL HIGH (ref 15–41)
Albumin: 3.4 g/dL — ABNORMAL LOW (ref 3.5–5.0)
Alkaline Phosphatase: 59 U/L (ref 38–126)
Anion gap: 13 (ref 5–15)
BUN: 35 mg/dL — ABNORMAL HIGH (ref 6–20)
CO2: 16 mmol/L — ABNORMAL LOW (ref 22–32)
Calcium: 9.5 mg/dL (ref 8.9–10.3)
Chloride: 110 mmol/L (ref 98–111)
Creatinine, Ser: 3.99 mg/dL — ABNORMAL HIGH (ref 0.44–1.00)
GFR, Estimated: 14 mL/min — ABNORMAL LOW (ref >60)
Glucose, Bld: 140 mg/dL — ABNORMAL HIGH (ref 70–99)
Potassium: 4.2 mmol/L (ref 3.5–5.1)
Sodium: 139 mmol/L (ref 135–145)
Total Bilirubin: 0.7 mg/dL (ref 0.0–1.2)
Total Protein: 6.6 g/dL (ref 6.5–8.1)

## 2023-11-06 LAB — URINALYSIS, COMPLETE (UACMP) WITH MICROSCOPIC
Bilirubin Urine: NEGATIVE
Glucose, UA: >=500 mg/dL — AB
Ketones, ur: NEGATIVE mg/dL
Leukocytes,Ua: NEGATIVE
Nitrite: NEGATIVE
Protein, ur: 100 mg/dL — AB
Specific Gravity, Urine: 1.008 (ref 1.005–1.030)
pH: 6 (ref 5.0–8.0)

## 2023-11-06 LAB — TSH: TSH: 2.506 u[IU]/mL (ref 0.350–4.500)

## 2023-11-06 LAB — CBG MONITORING, ED
Glucose-Capillary: 132 mg/dL — ABNORMAL HIGH (ref 70–99)
Glucose-Capillary: 57 mg/dL — ABNORMAL LOW (ref 70–99)
Glucose-Capillary: 70 mg/dL (ref 70–99)

## 2023-11-06 LAB — DIFFERENTIAL
Abs Immature Granulocytes: 0.03 10*3/uL (ref 0.00–0.07)
Basophils Absolute: 0 10*3/uL (ref 0.0–0.1)
Basophils Relative: 1 %
Eosinophils Absolute: 0.1 10*3/uL (ref 0.0–0.5)
Eosinophils Relative: 2 %
Immature Granulocytes: 1 %
Lymphocytes Relative: 26 %
Lymphs Abs: 1.5 10*3/uL (ref 0.7–4.0)
Monocytes Absolute: 0.5 10*3/uL (ref 0.1–1.0)
Monocytes Relative: 9 %
Neutro Abs: 3.8 10*3/uL (ref 1.7–7.7)
Neutrophils Relative %: 61 %
Smear Review: UNDETERMINED

## 2023-11-06 LAB — HEPATITIS PANEL, ACUTE
HCV Ab: NONREACTIVE
Hep A IgM: NONREACTIVE
Hep B C IgM: NONREACTIVE
Hepatitis B Surface Ag: NONREACTIVE

## 2023-11-06 LAB — RAPID URINE DRUG SCREEN, HOSP PERFORMED
Amphetamines: NOT DETECTED
Barbiturates: NOT DETECTED
Benzodiazepines: NOT DETECTED
Cocaine: NOT DETECTED
Opiates: NOT DETECTED
Tetrahydrocannabinol: NOT DETECTED

## 2023-11-06 LAB — HEMOGLOBIN A1C
Hgb A1c MFr Bld: 6.4 % — ABNORMAL HIGH (ref 4.8–5.6)
Mean Plasma Glucose: 136.98 mg/dL

## 2023-11-06 LAB — ACETAMINOPHEN LEVEL: Acetaminophen (Tylenol), Serum: <10 ug/mL — ABNORMAL LOW (ref 10–30)

## 2023-11-06 LAB — HIV ANTIBODY (ROUTINE TESTING W REFLEX): HIV Screen 4th Generation wRfx: NONREACTIVE

## 2023-11-06 LAB — APTT: aPTT: 33 s (ref 24–36)

## 2023-11-06 LAB — HCG, SERUM, QUALITATIVE: Preg, Serum: NEGATIVE

## 2023-11-06 LAB — ETHANOL: Alcohol, Ethyl (B): <10 mg/dL (ref <10)

## 2023-11-06 MED ORDER — ACETAMINOPHEN 325 MG PO TABS: 650.0000 mg | ORAL_TABLET | Freq: Four times a day (QID) | ORAL | Status: DC | PRN

## 2023-11-06 MED ORDER — METOPROLOL SUCCINATE ER 25 MG PO TB24
25.0000 mg | ORAL_TABLET | Freq: Every day | ORAL | Status: DC
  Administered 2023-11-07 – 2023-11-09 (×3): 25 mg via ORAL
  Filled 2023-11-06 (×3): qty 1

## 2023-11-06 MED ORDER — SODIUM CHLORIDE 0.9 % IV SOLN: INTRAVENOUS | Status: AC

## 2023-11-06 MED ORDER — ACETAMINOPHEN 650 MG RE SUPP: 650.0000 mg | Freq: Four times a day (QID) | RECTAL | Status: DC | PRN

## 2023-11-06 MED ORDER — ONDANSETRON HCL 4 MG PO TABS: 4.0000 mg | ORAL_TABLET | Freq: Four times a day (QID) | ORAL | Status: DC | PRN

## 2023-11-06 MED ORDER — ONDANSETRON HCL 4 MG/2ML IJ SOLN: 4.0000 mg | Freq: Four times a day (QID) | INTRAMUSCULAR | Status: DC | PRN

## 2023-11-06 MED ORDER — INSULIN ASPART 100 UNIT/ML IJ SOLN
0.0000 [IU] | Freq: Three times a day (TID) | INTRAMUSCULAR | Status: DC
  Administered 2023-11-07 – 2023-11-09 (×4): 1 [IU] via SUBCUTANEOUS
  Administered 2023-11-10 – 2023-11-11 (×5): 2 [IU] via SUBCUTANEOUS
  Administered 2023-11-12: 1 [IU] via SUBCUTANEOUS
  Administered 2023-11-13 – 2023-11-14 (×2): 2 [IU] via SUBCUTANEOUS

## 2023-11-06 NOTE — Assessment & Plan Note (Signed)
 No recent A1C Hold metformin  with AKI/inpatient Hold jardiance  Sensitive SSI and accuchecks QAC/HS

## 2023-11-06 NOTE — ED Notes (Signed)
 Patient to MRI.

## 2023-11-06 NOTE — Assessment & Plan Note (Addendum)
-  New onset right sided weakness, worse in RLE that started two days ago.  -CT head with abnormal finding, although in right hemisphere so would expect more deficits on left. She states her left sided deficits are at baseline  -MRI brain w/wo contrast pending (new area of foci slightly increased could represent hemorrhage or gliosis) (will have to do without contrast)  -concern for pelvic mass that is palpable on the right is compressing spinal cord--> stat lumbar MRI, will have to do without contrast due to renal function. Discussed with radiology  -neuro checks -check B12, TSH

## 2023-11-06 NOTE — Assessment & Plan Note (Addendum)
 CT renal study in 04/2023: Large complex cystic mass in the pelvis measures 17.9 cm. Favor adnexal origin, possibly representing a low-grade ovarian cystic neoplasm. -she denies any vaginal bleeding -no f/u since that time, no pap smears in a long time  -palpable large right mass in lower abdomen  -recommended Mri pelvis w/wo contrast, but due to worsening renal function radiology recommended CT abdomen/pelvis  -check pelvic US  -concern this is contributing to her worsening renal function/liver function  -concern that this is causing her right sided weakness- stat lumbar MRI  -will get specialists consulted once we have imaging back

## 2023-11-06 NOTE — Assessment & Plan Note (Addendum)
 43 year old presenting with 2 day history of right sided weakness found to have AKI on CKD (creatinine 8/24: 2.9) Had onset of AKI in August thought due to new pelvic mass  -obs to tele -UA pending -UDS pending -check urine studies -check renal US  -strict I/O -avoid nephrotoxic drugs -f/u on stat imaging of back/pelvis as likely due to obstruction  -gentle IVF, trend

## 2023-11-06 NOTE — Assessment & Plan Note (Signed)
 Secondary to AKI, no AG  Gentle IVF Monitor bicarb, consider oral replacement if no improvement

## 2023-11-06 NOTE — ED Provider Notes (Signed)
 [place] EMERGENCY DEPARTMENT AT [facility] Provider Note  MDM   HPI/ROS:  Ebony Matthews is a 43 y.o. female with pertinent past medical history of CVA with left-sided deficits (02/2020), DM, HTN, cocaine use who presents for evaluation of extremity weakness.  Patient reports a 3-4-day history of worsening extremity weakness that she states began initially in her right upper extremity is since spread to her right lower extremity.  She additionally reports worsening of her baseline left hemibody weakness.  She denies any associated sensory deficits, new facial droop, difficulty speaking or vision changes.  She notes yesterday she noted she could no longer push herself up in bed and today she was unable to walk due to her weakness.  Normally she does need some assistance getting up but is subsequently able to ambulate with a walker.  She did have a mechanical fall on 11/01/2023 for which she was seen at [facility] ED but states that she did not hit her head or lose consciousness and was assisted to the ground by her caretaker.  She denies any other recent falls.  She does note some mild mid back pain that she associates with spending a prolonged amount of time in bed over the last couple of days.  She additionally denies any fevers, chills chest pain, dyspnea, abdominal pain, nausea, vomiting, dysuria, hematuria, diarrhea, constipation.She is not sure if she is still taking her Plavix .  Physical exam is notable for: - 4/5 strength in right upper and right lower extremity most weak with hip flexion and grip strength.  Finger-nose intact.  No pronator drift on the right upper extremity.  Pronator drift present in the right lower extremity. -- Mild left-sided facial droop (old per review of prior neurology visit)  On my initial evaluation, patient is:  -Vital signs stable. Patient afebrile, hemodynamically stable, and non-toxic appearing. -Additional history obtained from review of prior  records -Labs reviewed: WBC 6.0, hemoglobin 11.6, creatinine 3.99 (increased from 2.18 five days ago), BUN 35, CO2 16, AG 13, AST 381, ALT 255 -Imaging reviewed: CT head with Extensive hypodensity in the right cerebral hemisphere, which is new from the prior CT; some of this likely correlates with evolution of the acute infarcts noted on 04/15/2020, although the area involved is greater than would be expected. Within this area, there are foci of slightly increased density, which could represent hemorrhage or gliosis.  This patient's current presentation, including their history and physical exam, is most concerning for possible CVA, though notably above CT findings with more concerning areas in the right hemisphere which would not necessarily correlate with patient's new right hemibody weakness.  Her current neurologic exam of the left is reportedly consistent with baseline, though with significantly increased weakness on the right.  Additionally considered possible spinal stenosis, spinal epidural hematoma, abscess that could be contributing to extremity weakness, however patient with no fevers, no leukocytosis, and no tenderness over the back.    Unclear etiology of AKI on CKD.  Patient does report somewhat poor p.o. intake over the past several days due to not wanting to require help getting up to use the restroom, but does not appear significantly dehydrated on exam and BUN/creatinine ratio not consistent with prerenal etiology.  Consider obstructive uropathy versus intrarenal.  Patient with no urinary symptoms or difficulty urinating and no history of nephrolithiasis.  No new nephrotoxic medications.  She additionally has transaminitis that is new within the past week though not significantly increased from labs  obtained at  [facility] 5 days ago.  MRI brain was ordered to better evaluate for potential new stroke, but she is out of window for tPA or thrombectomy so deferring neurology evaluation pending  results. She will require admission for further evaluation and management of AKI on CKD, transaminitis, as well as possible new stroke.  Please see inpatient team note for further treatment plan details.  Patient remained stable had no acute on swelling return emergency department.  Disposition:  I discussed the case with Dr. Waddell with the hospitalist service who graciously agreed to admit the patient to their service for continued care.   Clinical Impression:  1. Weakness   2. Cerebrovascular accident (CVA), unspecified mechanism (HCC)     Rx / DC Orders ED Discharge Orders     None       The plan for this patient was discussed with Dr. Zavitz, who voiced agreement and who oversaw evaluation and treatment of this patient.   Clinical Complexity A medically appropriate history, review of systems, and physical exam was performed.  My independent interpretations of EKG, labs, and radiology are documented in the ED course above.   If decision rules were used in this patient's evaluation, they are listed below.   Patient's presentation is most consistent with acute presentation with potential threat to life or bodily function.  Medical Decision Making Amount and/or Complexity of Data Reviewed Labs: ordered. Radiology: ordered.  Risk Decision regarding hospitalization.    HPI/ROS      See MDM section for pertinent HPI and ROS. A complete ROS was performed with pertinent positives/negatives noted above.   Past Medical History:  Diagnosis Date   Diabetes mellitus without complication (HCC)    Hyperlipidemia    Hypertension    Loop recorder [employer] 12/14/2020 12/14/2020   Scheduled Remote loop recorder check 12/14/2020: NSR. New implant.    Stroke [facility])    TIA (transient ischemic attack) 01/22/2019    Past Surgical History:  Procedure Laterality Date   NO PAST SURGERIES        Physical Exam   Vitals:   11/06/23 1302 11/06/23 1304 11/06/23 1435  BP:  122/80  108/68  Pulse:  99 95  Resp:  18 (!) 22  Temp:  98.7 F (37.1 C)   SpO2: 100% 100% 100%  Height:  5' 7 (1.702 m)     Physical Exam Gen: NAD. Appears comfortable HENT: Conjunctiva clear, PERRL, EOMI. MMM.  CV: RRR. No M/R/G Pulm: Lungs CTAB with no wheezing, rales, or rhonchi.  GI: Abdomen soft, non-tender, non-distended. Normal bowel sounds in all 4 quadrants. MSK/Skin: No lower extremity edema. Extremities warm, well-perfused with 2+ pulses in all 4 extremities. Neuro: A&Ox3. GCS 15.  5/5 strength with plantarflexion and extension at the knee on the right.  2/5 strength with dorsiflexion, hip flexion, knee flexion on the right.  1/5 strength in the left lower extremity.  Pronator drift present on the right.  Unable to lift left extremity.  Minimal spontaneous movement of the left upper extremity.  Decreased grip strength on the right otherwise 4/5 strength in the right upper extremity.  Sensation is intact to light touch throughout.    Procedures   If procedures were preformed on this patient, they are listed below:  Procedures   Laverda Rimes, MD Emergency Medicine PGY-2   Please note that this documentation was produced with the assistance of voice-to-text technology and may contain errors.    Rimes Laverda, MD 11/07/23 1333    Tonia,  Fonda, MD 11/08/23 [phone]

## 2023-11-06 NOTE — H&P (Addendum)
 History and Physical    Patient: Ebony Matthews FMW:[id number] DOB: 1981/07/09 DOA: 11/06/2023 DOS: the patient was seen and examined on 11/06/2023 PCP: Maree Leni Edyth DELENA, MD  Patient coming from: Home - lives alone. Uses walker and cane.    Chief Complaint: right sided weakness   HPI: Ebony Matthews is a 43 y.o. female with medical history significant of T2DM, hx of CVA with left sided deficits, HTN, cocaine use who presents to ED with complaints of right sided weakness x 2 days. Started in her RUE and then to her RLE. She also feels like her left weakness is worse. She states she came to ED due to weakness. She said she wasn't able to push herself up out of the bed. She states it slowly moved down to her right leg. She states it felt like dead weight. She states it made her fall. She fell two days ago and had to call EMS to help her off the floor. Her left sided weakness is at baseline. Poor historian.   She had a fall on 2/2. Seen at [facility]. Did not hit her head. She slipped on floor with her socks.   Denies any fever/chills, vision changes/headaches, chest pain or palpitations, shortness of breath or cough, abdominal pain, N/V/D, dysuria or leg swelling. She has not taken any NSAIDs. She feels like her PO intake is normal. Her urine does look darker to her. She has had no issues urinating or decreased urine output.    She does not smoke or drink alcohol. Remote hx of cocaine use, about 3-4 years ago.   ER Course:  vitals: afebrile, bp: 122/80, HR: 18, oxygen: 100%RA Pertinent labs: BUN: 35, creatinine: 3.99, AST: 381, ALT: 255, CO2:16,  CT head: Extensive hypodensity in the right cerebral hemisphere, which is new from the prior CT; some of this likely correlates with evolution of the acute infarcts noted on 04/15/2020, although the area involved is greater than would be expected. Within this area, there are foci of slightly increased density, which could represent hemorrhage or gliosis. An  MRI of the brain is recommended for further evaluation. In ED: MRI ordered, TRH asked to admit.    Review of Systems: As mentioned in the history of present illness. All other systems reviewed and are negative. Past Medical History:  Diagnosis Date   Diabetes mellitus without complication (HCC)    Hyperlipidemia    Hypertension    Loop recorder [employer] 12/14/2020 12/14/2020   Scheduled Remote loop recorder check 12/14/2020: NSR. New implant.    Stroke [facility])    TIA (transient ischemic attack) 01/22/2019   Past Surgical History:  Procedure Laterality Date   NO PAST SURGERIES     Social History:  reports that she quit smoking about 4 years ago. Her smoking use included cigarettes. She started smoking about 19 years ago. She has a 15 pack-year smoking history. She has never used smokeless tobacco. She reports that she does not currently use drugs after having used the following drugs: Marijuana and Cocaine. She reports that she does not drink alcohol.  No Known Allergies  Family History  Problem Relation Age of Onset   Diabetes Other    Hypertension Maternal Grandmother     Prior to Admission medications   Medication Sig Start Date End Date Taking? Authorizing Provider  atorvastatin  (LIPITOR) 40 MG tablet Take 40 mg by mouth at bedtime.    [provider]  BD PEN NEEDLE MICRO U/F 32G X 6 MM MISC Inject  into the skin as directed. 08/13/22   [provider]  cephALEXin  (KEFLEX ) 500 MG capsule Take 1 capsule (500 mg total) by mouth 4 (four) times daily. 05/19/23   Patsey Lot, MD  clopidogrel  (PLAVIX ) 75 MG tablet Take 1 tablet (75 mg total) by mouth daily. 09/25/20   Skeet Juliene SAUNDERS, DO  Empagliflozin (JARDIANCE PO) Take 10 mg by mouth at bedtime.    [provider]  ergocalciferol  (VITAMIN D2) 1.25 MG (50000 UT) capsule Take 50,000 Units by mouth once a week.    [provider]  fenofibrate  (TRICOR ) 145 MG tablet Take 1 tablet (145 mg total) by  mouth daily. 06/11/23 09/09/23  Tolia, Sunit, DO  LANTUS  SOLOSTAR 100 UNIT/ML Solostar Pen Inject 12 Units into the skin daily. 08/13/22   [provider]  metFORMIN  (GLUCOPHAGE ) 1000 MG tablet Take 1 tablet (1,000 mg total) by mouth 2 (two) times daily. 03/04/20   Jens Durand, MD  metoprolol  succinate (TOPROL -XL) 25 MG 24 hr tablet TAKE 1 TABLET BY MOUTH ONCE DAILY. HOLD IF BLOOD PRESSURE NUMBER LESS THAN OR HEART RATE LESS THAN 60 BPM(PULSE) 10/02/22   Tolia, Sunit, DO  nitroGLYCERIN  (NITROSTAT ) 0.4 MG SL tablet Place 1 tablet (0.4 mg total) under the tongue every 5 (five) minutes as needed for chest pain. If you require more than two tablets five minutes apart go to the nearest ER via EMS. 12/21/20 05/21/23  Michele Richardson, DO    Physical Exam: Vitals:   11/06/23 1645 11/06/23 1650 11/06/23 1700 11/06/23 1715  BP: (!) 135/91  (!) 149/92 (!) 150/93  Pulse: 99  (!) 103 97  Resp: (!) 26  (!) 22 (!) 23  Temp:  98.9 F (37.2 C)    TempSrc:  Oral    SpO2: 100%  100% 95%  Height:       General:  Appears calm and comfortable and is in NAD Eyes:  PERRL, EOMI, normal lids, iris ENT:  grossly normal hearing, lips & tongue, mmm; appropriate dentition Neck:  no LAD, masses or thyromegaly; no carotid bruits Cardiovascular:  RRR, no m/r/g. trace LE edema.  Respiratory:   CTA bilaterally with no wheezes/rales/rhonchi.  Normal respiratory effort. Abdomen:  soft, large palpable mass in RLQ. BS +  Back:   normal alignment, no CVAT Skin:  no rash or induration seen on limited exam Musculoskeletal:  LUE: 0/5 and LLE: 1/5.  RUE: 5/5 and RLE: 2/5  Lower extremity:  No LE edema.  Limited foot exam with no ulcerations.  2+ distal pulses. Psychiatric:  grossly normal mood and affect, speech fluent and appropriate, AOx3 Neurologic:  CN 2-12 grossly intact, moves all extremities in coordinated fashion (at baseline), sensation intact   Radiological Exams on Admission: Independently reviewed -  see discussion in A/P where applicable  MR BRAIN WO CONTRAST Result Date: 11/06/2023 CLINICAL DATA:  Neuro deficit, acute, stroke suspected. Right lower extremity weakness. EXAM: MRI HEAD WITHOUT CONTRAST TECHNIQUE: Multiplanar, multiecho pulse sequences of the brain and surrounding structures were obtained without intravenous contrast. COMPARISON:  CT head without contrast 11/06/2023. MR head without and with contrast 04/15/2020 FINDINGS: Brain: Chronic encephalomalacia is present in the right MCA territory with sparing of the right temporal lobe. Remote blood products are noted anteriorly and superiorly. No acute infarct or hemorrhage is present. Ex vacuo dilation of the right lateral ventricle is noted. Ventricles are otherwise normal in size. Deep brain nuclei are within normal limits. Wall layering degeneration is present in right cerebral peduncle.  White matter changes extend into the brainstem. The brainstem and cerebellum are otherwise within normal limits. The internal auditory canals are within normal limits. Vascular: Abnormal signal is present within the right internal carotid artery proximal to the ophthalmic segment. Flow is present in the left internal carotid artery and both vertebral arteries. Skull and upper cervical spine: The craniocervical junction is normal. Upper cervical spine is within normal limits. Marrow signal is unremarkable. Sinuses/Orbits: The paranasal sinuses and mastoid air cells are clear. The globes and orbits are within normal limits. IMPRESSION: 1. No acute intracranial abnormality. 2. Chronic encephalomalacia in the right MCA territory with sparing of the right temporal lobe. 3. Abnormal signal within the right internal carotid artery proximal to the ophthalmic segment. This likely reflects chronic occlusion. 4. White matter changes as described likely reflect the sequela of chronic microvascular ischemia. Electronically Signed   By: Lonni Necessary M.D.   On:  11/06/2023 18:14   CT HEAD WO CONTRAST Result Date: 11/06/2023 CLINICAL DATA:  Right leg weakness, stroke suspected EXAM: CT HEAD WITHOUT CONTRAST TECHNIQUE: Contiguous axial images were obtained from the base of the skull through the vertex without intravenous contrast. RADIATION DOSE REDUCTION: This exam was performed according to the departmental dose-optimization program which includes automated exposure control, adjustment of the mA and/or kV according to patient size and/or use of iterative reconstruction technique. COMPARISON:  04/15/2020 CT head and MRI head FINDINGS: Brain: Extensive hypodensity in the right cerebral hemisphere, which is new from the prior CT; some of this likely correlates with evolution of the acute infarcts noted on 04/15/2020, although the area involved is greater than would be expected. Within this area, there are foci of slightly increased density (series 2, images 24 and 25 close), which could represent hemorrhage or gliosis. Ex vacuo dilatation of the right lateral ventricle. Well layering degeneration of the right pons and midbrain. No mass, mass effect, or midline shift. No hydrocephalus or extra-axial collection. Vascular: No hyperdense vessel. Skull: Negative for fracture or focal lesion. Sinuses/Orbits: No acute finding. Other: The mastoid air cells are well aerated. IMPRESSION: Extensive hypodensity in the right cerebral hemisphere, which is new from the prior CT; some of this likely correlates with evolution of the acute infarcts noted on 04/15/2020, although the area involved is greater than would be expected. Within this area, there are foci of slightly increased density, which could represent hemorrhage or gliosis. An MRI of the brain is recommended for further evaluation. These results were called by telephone at the time of interpretation on 11/06/2023 at 3:09 pm to provider G[place]ON, who verbally acknowledged these results. Electronically Signed   By: Donald Campion M.D.    On: 11/06/2023 15:09    EKG: pending    Labs on Admission: I have personally reviewed the available labs and imaging studies at the time of the admission.  Pertinent labs:   BUN: 35,  creatinine: 3.99,  AST: 381,  ALT: 255,  CO2:16,   Assessment and Plan: Principal Problem:   Acute renal failure superimposed on stage 4 chronic kidney disease (HCC) Active Problems:   Pelvic mass   Right sided weakness   Transaminitis   Metabolic acidosis   Uncontrolled type 2 diabetes mellitus with hyperglycemia, without long-term current use of insulin  (HCC)   Essential hypertension, benign   History of CVA with left sided deficits    Assessment and Plan: * Acute renal failure superimposed on stage 4 chronic kidney disease (HCC) 43 year old presenting with 2 day  history of right sided weakness found to have AKI on CKD (creatinine 8/24: 2.9) Had onset of AKI in August thought due to new pelvic mass  -obs to tele -UA pending -UDS pending -check urine studies -check renal US  -strict I/O -avoid nephrotoxic drugs -f/u on stat imaging of back/pelvis as likely due to obstruction  -gentle IVF, trend   Pelvic mass CT renal study in 04/2023: Large complex cystic mass in the pelvis measures 17.9 cm. Favor adnexal origin, possibly representing a low-grade ovarian cystic neoplasm. -she denies any vaginal bleeding -no f/u since that time, no pap smears in a long time  -palpable large right mass in lower abdomen  -recommended Mri pelvis w/wo contrast, but due to worsening renal function radiology recommended CT abdomen/pelvis  -check pelvic US  -concern this is contributing to her worsening renal function/liver function  -concern that this is causing her right sided weakness- stat lumbar MRI  -will get specialists consulted once we have imaging back   Right sided weakness -New onset right sided weakness, worse in RLE that started two days ago.  -CT head with abnormal finding, although in  right hemisphere so would expect more deficits on left. She states her left sided deficits are at baseline  -MRI brain w/wo contrast pending (new area of foci slightly increased could represent hemorrhage or gliosis) (will have to do without contrast)  -concern for pelvic mass that is palpable on the right is compressing spinal cord--> stat lumbar MRI, will have to do without contrast due to renal function. Discussed with radiology  -neuro checks -check B12, TSH   Transaminitis Concern for congestion from pelvic mass Liver US  wnl in 2/25 Check tylenol  and hepatitis panel  CT abdomen/pelvis  Hold statin  Metabolic acidosis Secondary to AKI, no AG  Gentle IVF Monitor bicarb, consider oral replacement if no improvement   Uncontrolled type 2 diabetes mellitus with hyperglycemia, without long-term current use of insulin  (HCC) No recent A1C Hold metformin  with AKI/inpatient Hold jardiance  Sensitive SSI and accuchecks QAC/HS  Essential hypertension, benign Continue toprol  for now   History of CVA with left sided deficits History of 2 CVA, last in 2021 with left sided hemiplegia.  She states her left weakness is at baseline. Unable to move her LUE at all and very minimal movement of LLE Unsure what medication she is taking or if on her plavix  Hold statin with elevated LFTs     Advance Care Planning:   Code Status: Full Code   Consults: none at time of admit   DVT Prophylaxis: SCDs  Family Communication: called aunt, unable to leave VM.   Severity of Illness: The appropriate patient status for this patient is OBSERVATION. Observation status is judged to be reasonable and necessary in order to provide the required intensity of service to ensure the patient's safety. The patient's presenting symptoms, physical exam findings, and initial radiographic and laboratory data in the context of their medical condition is felt to place them at decreased risk for further clinical  deterioration. Furthermore, it is anticipated that the patient will be medically stable for discharge from the hospital within 2 midnights of admission.   Author: Isaiah Geralds, MD 11/06/2023 6:21 PM  For on call review [email or web address].

## 2023-11-06 NOTE — Assessment & Plan Note (Addendum)
 Continue toprol  for now

## 2023-11-06 NOTE — Assessment & Plan Note (Addendum)
 Concern for congestion from pelvic mass Liver US  wnl in 2/25 Check tylenol  and hepatitis panel  CT abdomen/pelvis  Hold statin

## 2023-11-06 NOTE — ED Notes (Signed)
 Pt given two cups of orange juice, a sandwich bag and peanut butter. RN notified of CBG

## 2023-11-06 NOTE — Assessment & Plan Note (Addendum)
 History of 2 CVA, last in 2021 with left sided hemiplegia.  She states her left weakness is at baseline. Unable to move her LUE at all and very minimal movement of LLE Unsure what medication she is taking or if on her plavix  Hold statin with elevated LFTs

## 2023-11-06 NOTE — ED Triage Notes (Signed)
 Patient via EMS from home for eval of R leg weakness x several weeks but worsening x 2 days. Hx CVA with L sided deficits. States this same amount of weakness was present when she was seen at [facility] on 2/2 after a fall. Normally able to get around her house but EMS reports unable to ambulate now d/t weakness.

## 2023-11-07 ENCOUNTER — Encounter (HOSPITAL_COMMUNITY): Payer: Self-pay | Admitting: Family Medicine

## 2023-11-07 DIAGNOSIS — Z8249 Family history of ischemic heart disease and other diseases of the circulatory system: Secondary | ICD-10-CM | POA: Diagnosis not present

## 2023-11-07 DIAGNOSIS — R1900 Intra-abdominal and pelvic swelling, mass and lump, unspecified site: Secondary | ICD-10-CM

## 2023-11-07 DIAGNOSIS — N179 Acute kidney failure, unspecified: Secondary | ICD-10-CM | POA: Diagnosis present

## 2023-11-07 DIAGNOSIS — L659 Nonscarring hair loss, unspecified: Secondary | ICD-10-CM | POA: Diagnosis present

## 2023-11-07 DIAGNOSIS — R81 Glycosuria: Secondary | ICD-10-CM | POA: Diagnosis present

## 2023-11-07 DIAGNOSIS — R7401 Elevation of levels of liver transaminase levels: Secondary | ICD-10-CM | POA: Diagnosis not present

## 2023-11-07 DIAGNOSIS — R531 Weakness: Secondary | ICD-10-CM | POA: Diagnosis present

## 2023-11-07 DIAGNOSIS — I6521 Occlusion and stenosis of right carotid artery: Secondary | ICD-10-CM | POA: Diagnosis present

## 2023-11-07 DIAGNOSIS — I129 Hypertensive chronic kidney disease with stage 1 through stage 4 chronic kidney disease, or unspecified chronic kidney disease: Secondary | ICD-10-CM | POA: Diagnosis present

## 2023-11-07 DIAGNOSIS — E1165 Type 2 diabetes mellitus with hyperglycemia: Secondary | ICD-10-CM | POA: Diagnosis present

## 2023-11-07 DIAGNOSIS — N184 Chronic kidney disease, stage 4 (severe): Secondary | ICD-10-CM | POA: Diagnosis present

## 2023-11-07 DIAGNOSIS — I10 Essential (primary) hypertension: Secondary | ICD-10-CM | POA: Diagnosis not present

## 2023-11-07 DIAGNOSIS — M5126 Other intervertebral disc displacement, lumbar region: Secondary | ICD-10-CM | POA: Diagnosis present

## 2023-11-07 DIAGNOSIS — E872 Acidosis, unspecified: Secondary | ICD-10-CM | POA: Diagnosis present

## 2023-11-07 DIAGNOSIS — T796XXS Traumatic ischemia of muscle, sequela: Secondary | ICD-10-CM | POA: Diagnosis not present

## 2023-11-07 DIAGNOSIS — E1122 Type 2 diabetes mellitus with diabetic chronic kidney disease: Secondary | ICD-10-CM | POA: Diagnosis present

## 2023-11-07 DIAGNOSIS — E66811 Obesity, class 1: Secondary | ICD-10-CM | POA: Diagnosis present

## 2023-11-07 DIAGNOSIS — M6282 Rhabdomyolysis: Secondary | ICD-10-CM | POA: Diagnosis present

## 2023-11-07 DIAGNOSIS — R809 Proteinuria, unspecified: Secondary | ICD-10-CM | POA: Diagnosis present

## 2023-11-07 DIAGNOSIS — I69354 Hemiplegia and hemiparesis following cerebral infarction affecting left non-dominant side: Secondary | ICD-10-CM | POA: Diagnosis not present

## 2023-11-07 DIAGNOSIS — L680 Hirsutism: Secondary | ICD-10-CM | POA: Diagnosis present

## 2023-11-07 DIAGNOSIS — F1411 Cocaine abuse, in remission: Secondary | ICD-10-CM | POA: Diagnosis present

## 2023-11-07 DIAGNOSIS — D631 Anemia in chronic kidney disease: Secondary | ICD-10-CM | POA: Diagnosis present

## 2023-11-07 DIAGNOSIS — E785 Hyperlipidemia, unspecified: Secondary | ICD-10-CM | POA: Diagnosis present

## 2023-11-07 DIAGNOSIS — Z794 Long term (current) use of insulin: Secondary | ICD-10-CM | POA: Diagnosis not present

## 2023-11-07 DIAGNOSIS — I639 Cerebral infarction, unspecified: Secondary | ICD-10-CM | POA: Diagnosis not present

## 2023-11-07 DIAGNOSIS — I69392 Facial weakness following cerebral infarction: Secondary | ICD-10-CM | POA: Diagnosis not present

## 2023-11-07 DIAGNOSIS — Z7984 Long term (current) use of oral hypoglycemic drugs: Secondary | ICD-10-CM | POA: Diagnosis not present

## 2023-11-07 DIAGNOSIS — Z6833 Body mass index (BMI) 33.0-33.9, adult: Secondary | ICD-10-CM | POA: Diagnosis not present

## 2023-11-07 LAB — GLUCOSE, CAPILLARY
Glucose-Capillary: 121 mg/dL — ABNORMAL HIGH (ref 70–99)
Glucose-Capillary: 74 mg/dL (ref 70–99)
Glucose-Capillary: 119 mg/dL — ABNORMAL HIGH (ref 70–99)

## 2023-11-07 LAB — CBC
HCT: 31.4 % — ABNORMAL LOW (ref 36.0–46.0)
Hemoglobin: 9.9 g/dL — ABNORMAL LOW (ref 12.0–15.0)
MCH: 26.8 pg (ref 26.0–34.0)
MCHC: 31.5 g/dL (ref 30.0–36.0)
MCV: 84.9 fL (ref 80.0–100.0)
Platelets: 264 10*3/uL (ref 150–400)
RBC: 3.7 MIL/uL — ABNORMAL LOW (ref 3.87–5.11)
RDW: 17.3 % — ABNORMAL HIGH (ref 11.5–15.5)
WBC: 7.3 10*3/uL (ref 4.0–10.5)
nRBC: 0 % (ref 0.0–0.2)

## 2023-11-07 LAB — BASIC METABOLIC PANEL
Anion gap: 11 (ref 5–15)
BUN: 38 mg/dL — ABNORMAL HIGH (ref 6–20)
CO2: 18 mmol/L — ABNORMAL LOW (ref 22–32)
Calcium: 9 mg/dL (ref 8.9–10.3)
Chloride: 109 mmol/L (ref 98–111)
Creatinine, Ser: 4.24 mg/dL — ABNORMAL HIGH (ref 0.44–1.00)
GFR, Estimated: 13 mL/min — ABNORMAL LOW (ref >60)
Glucose, Bld: 81 mg/dL (ref 70–99)
Potassium: 3.8 mmol/L (ref 3.5–5.1)
Sodium: 138 mmol/L (ref 135–145)

## 2023-11-07 LAB — CK: Total CK: 23267 U/L — ABNORMAL HIGH (ref 38–234)

## 2023-11-07 LAB — CREATININE, URINE, RANDOM: Creatinine, Urine: 35 mg/dL

## 2023-11-07 MED ORDER — CYCLOBENZAPRINE HCL 5 MG PO TABS
5.0000 mg | ORAL_TABLET | Freq: Three times a day (TID) | ORAL | Status: DC | PRN
  Administered 2023-11-07: 5 mg via ORAL
  Filled 2023-11-07 (×2): qty 1

## 2023-11-07 MED ORDER — SODIUM CHLORIDE 0.9 % IV SOLN: INTRAVENOUS | Status: AC

## 2023-11-07 MED ORDER — LACTATED RINGERS IV BOLUS
2500.0000 mL | Freq: Once | INTRAVENOUS | Status: AC
  Administered 2023-11-07: 2500 mL via INTRAVENOUS

## 2023-11-07 MED ORDER — HEPARIN SODIUM (PORCINE) 5000 UNIT/ML IJ SOLN
5000.0000 [IU] | Freq: Three times a day (TID) | INTRAMUSCULAR | Status: DC
  Administered 2023-11-07 – 2023-11-14 (×21): 5000 [IU] via SUBCUTANEOUS
  Filled 2023-11-07 (×21): qty 1

## 2023-11-07 NOTE — Progress Notes (Addendum)
 PROGRESS NOTE    Ebony Matthews  FMW:[id number] DOB: 29-Mar-1981 DOA: 11/06/2023 PCP: Maree Leni Edyth DELENA, MD   Chief Complaint  Patient presents with   Extremity Weakness    Brief Narrative:   Ebony Matthews is a 43 y.o. female with medical history significant of T2DM, hx of CVA with left sided deficits, HTN, cocaine use who presents to ED with complaints of right sided weakness x 2 days. Started in her RUE and then to her RLE. She also feels like her left weakness is worse. She states she came to ED due to weakness. She said she wasn't able to push herself up out of the bed. She states it slowly moved down to her right leg. She states it felt like dead weight. She states it made her fall. She fell two days ago and had to call EMS to help her off the floor. Her left sided weakness is at baseline. Poor historian.    She had a fall on 2/2. Seen at [facility]. Did not hit her head. She slipped on floor with her socks.    Denies any fever/chills, vision changes/headaches, chest pain or palpitations, shortness of breath or cough, abdominal pain, N/V/D, dysuria or leg swelling. She has not taken any NSAIDs. She feels like her PO intake is normal. Her urine does look darker to her. She has had no issues urinating or decreased urine output.      She does not smoke or drink alcohol. Remote hx of cocaine use, about 3-4 years ago.    ER Course:  vitals: afebrile, bp: 122/80, HR: 18, oxygen: 100%RA Pertinent labs: BUN: 35, creatinine: 3.99, AST: 381, ALT: 255, CO2:16,  CT head: Extensive hypodensity in the right cerebral hemisphere, which is new from the prior CT; some of this likely correlates with evolution of the acute infarcts noted on 04/15/2020, although the area involved is greater than would be expected. Within this area, there are foci of slightly increased density, which could represent hemorrhage or gliosis. An MRI of the brain is recommended for further evaluation.     Assessment & Plan:    Principal Problem:   Acute renal failure superimposed on stage 4 chronic kidney disease (HCC) Active Problems:   Pelvic mass   Right sided weakness   Transaminitis   Metabolic acidosis   Uncontrolled type 2 diabetes mellitus with hyperglycemia, without long-term current use of insulin  (HCC)   Essential hypertension, benign   History of CVA with left sided deficits  Acute renal failure superimposed on stage 4 chronic kidney disease (HCC) Rhabdomyolysis 43 year old presenting with 2 day history of right sided weakness found to have AKI on CKD (creatinine 8/24: 2.9). - Had onset of AKI in August thought due to new pelvic mass  -There is no evidence of hydronephrosis on imaging. -Urinalysis significant for proteinuria . -Continue with IV fluids  -Avoid nephrotoxic medications  -Total CK significant elevated at 23,000, globin urine dipstick is positive while RBC is negative, concern for rhabdomyolysis, continue with aggressive IV hydration trend total CK   Pelvic mass CT renal study in 04/2023: Large complex cystic mass in the pelvis measures 17.9 cm. Favor adnexal origin, possibly representing a low-grade ovarian cystic neoplasm. -she denies any vaginal bleeding -Patient thinks last Pap smear was 2 to 3 years ago -GYN consulted regarding further recommendations  Right sided weakness -MRI brain is negative for acute CVA   Transaminitis Concern for congestion from pelvic mass Liver US  wnl in 2/25 Tylenol  level  within normal limit  negative hepatitis panel No acute finding in the liver and CT abdomen pelvis Hold statin   Metabolic acidosis Secondary to AKI, no AG  Gentle IVF Start on oral bicarb   Uncontrolled type 2 diabetes mellitus with hyperglycemia, and hypoglycemia, without long-term current use of insulin  (HCC) A1c 6.4 this morning Hold metformin  with AKI/inpatient Hold jardiance  Sensitive SSI and accuchecks QAC/HS -UA significant glucosuria, this is likely due to  [place].   Essential hypertension, benign Continue toprol  for now    History of CVA with left sided deficits History of 2 CVA, last in 2021 with left sided hemiplegia.  She states her left weakness is at baseline. Unable to move her LUE at all and very minimal movement of LLE Hold statin with elevated LFTs  Resume aspirin  if no further workup anticipated    DVT prophylaxis: [place] heparin  Code Status: Full code Family Communication: discussed with aunt by phone Disposition:      Consultants:  GYN  Subjective:   She is complaining of left lower extremity spasm, generalized weakness and fatigue Objective: Vitals:   11/07/23 0910 11/07/23 0912 11/07/23 1100 11/07/23 1212  BP:  123/72  138/86  Pulse:      Resp: 15 14 (!) 21 20  Temp: 98.2 F (36.8 C) 98.2 F (36.8 C)  98.5 F (36.9 C)  TempSrc: Oral Oral  Oral  SpO2:      Height:        Intake/Output Summary (Last 24 hours) at 11/07/2023 1548 Last data filed at 11/07/2023 1039 Gross per 24 hour  Intake 360 ml  Output 300 ml  Net 60 ml   There were no vitals filed for this visit.  Examination:  Awake Alert, Oriented X 3, dense left hemiparesis at baseline Symmetrical Chest wall movement, Good air movement bilaterally, CTAB RRR,No Gallops,Rubs or new Murmurs, No Parasternal Heave +ve B.Sounds, Abd Soft, No tenderness, No rebound - guarding or rigidity. No Cyanosis, Clubbing or edema, No new Rash or bruise      Data Reviewed: I have personally reviewed following labs and imaging studies  CBC: Recent Labs  Lab 11/01/23 1534 11/06/23 1332 11/06/23 1338 11/06/23 1558 11/07/23 0521  WBC 6.5 6.0  --   --  7.3  NEUTROABS 3.9 3.8  --   --   --   HGB 11.7* 11.6* 12.2  --  9.9*  HCT 37.2 37.5 36.0  --  31.4*  MCV 85.1 86.2  --   --  84.9  PLT 251 PLATELET CLUMPS NOTED ON SMEAR, UNABLE TO ESTIMATE  --  259 264    Basic Metabolic Panel: Recent Labs  Lab 11/01/23 1534 11/06/23 1332 11/06/23 1338  11/07/23 0521  NA 141 139 141 138  K 3.4* 4.2 4.2 3.8  CL 111 110 111 109  CO2 20* 16*  --  18*  GLUCOSE 96 140* 139* 81  BUN 22* 35* 40* 38*  CREATININE 2.18* 3.99* 4.50* 4.24*  CALCIUM  9.3 9.5  --  9.0    GFR: Estimated Creatinine Clearance: 20 mL/min (A) (by C-G formula based on SCr of 4.24 mg/dL (H)).  Liver Function Tests: Recent Labs  Lab 11/01/23 1534 11/06/23 1332  AST 224* 381*  ALT 152* 255*  ALKPHOS 55 59  BILITOT 0.7 0.7  PROT 7.2 6.6  ALBUMIN 3.7 3.4*    CBG: Recent Labs  Lab 11/06/23 1335 11/06/23 1710 11/06/23 1913 11/07/23 1232  GLUCAP 132* 57* 70 121*  No results found for this or any previous visit (from the past 240 hours).       Radiology Studies: CT ABDOMEN PELVIS WO CONTRAST Result Date: 11/06/2023 CLINICAL DATA:  Adnexal mass, malignancy suspected EXAM: CT ABDOMEN AND PELVIS WITHOUT CONTRAST TECHNIQUE: Multidetector CT imaging of the abdomen and pelvis was performed following the standard protocol without IV contrast. RADIATION DOSE REDUCTION: This exam was performed according to the departmental dose-optimization program which includes automated exposure control, adjustment of the mA and/or kV according to patient size and/or use of iterative reconstruction technique. COMPARISON:  CT renal 05/19/2023 FINDINGS: Lower chest: No acute abnormality. Hepatobiliary: No focal liver abnormality. No gallstones, gallbladder wall thickening, or pericholecystic fluid. No biliary dilatation. Pancreas: No focal lesion. Normal pancreatic contour. No surrounding inflammatory changes. No main pancreatic ductal dilatation. Spleen: Normal in size without focal abnormality. Adrenals/Urinary Tract: No adrenal nodule bilaterally. No nephrolithiasis and no hydronephrosis. No definite contour-deforming renal mass. No ureterolithiasis or hydroureter. The urinary bladder is unremarkable. Stomach/Bowel: Stomach is within normal limits. No evidence of bowel wall thickening  or dilatation. Appendix appears normal. Vascular/Lymphatic: No abdominal aorta or iliac aneurysm. Moderate to severe atherosclerotic plaque of the aorta and its branches. No abdominal, pelvic, or inguinal lymphadenopathy. Reproductive: Uterus is unremarkable. Similar-appearing 18 x 12 x22 cm cystic pelvic lesion with associated calcification (3:71). This mass abuts the uterus as well as the sigmoid colon and several loops of small bowel. Other: No intraperitoneal free fluid. No intraperitoneal free gas. No organized fluid collection. Musculoskeletal: No abdominal wall hernia or abnormality. No suspicious lytic or blastic osseous lesions. No acute displaced fracture. Multilevel degenerative changes of the spine. IMPRESSION: 1. Similar-appearing complex 22 cm cystic pelvic lesion. Malignancy is not excluded. Recommend gynecologic consultation and MRI pelvis with and without contrast (including the mid abdomen to visualize the entire mass) for further evaluation. 2.  Aortic Atherosclerosis (ICD10-I70.0). 3. Limited evaluation on this noncontrast study. Electronically Signed   By: Morgane  Naveau M.D.   On: 11/06/2023 20:39   US  PELVIS (TRANSABDOMINAL ONLY) Result Date: 11/06/2023 CLINICAL DATA:  Pelvic mass EXAM: TRANSABDOMINAL ULTRASOUND OF PELVIS TECHNIQUE: Transabdominal ultrasound examination of the pelvis was performed including evaluation of the uterus, ovaries, adnexal regions, and pelvic cul-de-sac. COMPARISON:  CT 05/19/2023 FINDINGS: Uterus Measurements: 8.9 x 4.0 x 5.0 cm = volume: 93 mL. No fibroids or other mass visualized. Endometrium Thickness: 5 mm in thickness.  No focal abnormality visualized. Right ovary Measurements: Right ovary tissue not definitively visualized. Large cystic mass in the right adnexae and extending into the abdomen measuring 18 x 20 x 14 cm. This is similar prior CT. Left ovary Measurements: 3.2 x 3.4 x 3.9 cm = volume: 22 mL. Normal appearance/no adnexal mass. Other findings:   No abnormal free fluid. IMPRESSION: 20 cm cystic mass noted in the pelvis and extending into the abdomen may arise from the right ovary. Normal right ovarian tissue not visualized. This is similar to prior CT and remains suspicious for low-grade ovarian neoplasm. Recommend gynecologic consultation. Electronically Signed   By: Franky Crease M.D.   On: 11/06/2023 19:20   US  RENAL Result Date: 11/06/2023 CLINICAL DATA:  AKI. EXAM: RENAL / URINARY TRACT ULTRASOUND COMPLETE COMPARISON:  05/19/2023. FINDINGS: Right Kidney: Renal measurements: 9.0 x 4.4 x 5.0 cm = volume: 103.94 mL. Increased parenchymal echogenicity. No mass or hydronephrosis visualized. Left Kidney: Renal measurements: 9.7 x 5.2 x 3.8 cm = volume: 99.58 mL. Increased parenchymal echogenicity. No mass or hydronephrosis visualized. Bladder: Appears  normal for degree of bladder distention. Other: None. IMPRESSION: Increased renal echogenicity bilaterally suggesting medical renal disease. Electronically Signed   By: Leita Birmingham M.D.   On: 11/06/2023 19:15   MR LUMBAR SPINE WO CONTRAST Result Date: 11/06/2023 CLINICAL DATA:  Lumbar radiculopathy, cancer suspected. EXAM: MRI LUMBAR SPINE WITHOUT CONTRAST TECHNIQUE: Multiplanar, multisequence MR imaging of the lumbar spine was performed. No intravenous contrast was administered. COMPARISON:  CT renal stone study 05/19/2023 FINDINGS: Segmentation: 5 non rib-bearing lumbar type vertebral bodies are present. The lowest fully formed vertebral body is L5. Alignment: No significant listhesis is present. Lumbar lordosis is maintained. Vertebrae:  Marrow signal and vertebral body heights are normal. Conus medullaris and cauda equina: Conus extends to the L2 level. Conus and cauda equina appear normal. Paraspinal and other soft tissues: A large cystic mass is again partially visualized. The paraspinous soft tissues are otherwise unremarkable. The bowel is displaced. Right ureter is dilated. Mild right-sided  hydronephrosis is present. Disc levels: L1-2: Normal disc signal and height is present. No focal protrusion or stenosis is present. L2-3: A leftward disc protrusion is present. No focal stenosis is present. L3-4: Normal disc signal and height is present. No focal protrusion or stenosis is present. L4-5: A broad-based disc protrusion is present. Mild facet hypertrophy is noted bilaterally. Mild subarticular and foraminal narrowing is present bilaterally. L5-S1: Normal disc signal and height is present. No focal protrusion or stenosis is present. IMPRESSION: 1. Mild subarticular and foraminal narrowing bilaterally at L4-5 secondary to a broad-based disc protrusion and mild facet hypertrophy. 2. Leftward disc protrusion at L2-3 without stenosis. 3. Large cystic mass partially visualized in the pelvis. This remains concerning for an ovarian neoplasm. Air 4. Mild right-sided hydronephrosis and hydroureter. Electronically Signed   By: Lonni Necessary M.D.   On: 11/06/2023 18:37   MR BRAIN WO CONTRAST Result Date: 11/06/2023 CLINICAL DATA:  Neuro deficit, acute, stroke suspected. Right lower extremity weakness. EXAM: MRI HEAD WITHOUT CONTRAST TECHNIQUE: Multiplanar, multiecho pulse sequences of the brain and surrounding structures were obtained without intravenous contrast. COMPARISON:  CT head without contrast 11/06/2023. MR head without and with contrast 04/15/2020 FINDINGS: Brain: Chronic encephalomalacia is present in the right MCA territory with sparing of the right temporal lobe. Remote blood products are noted anteriorly and superiorly. No acute infarct or hemorrhage is present. Ex vacuo dilation of the right lateral ventricle is noted. Ventricles are otherwise normal in size. Deep brain nuclei are within normal limits. Wall layering degeneration is present in right cerebral peduncle. White matter changes extend into the brainstem. The brainstem and cerebellum are otherwise within normal limits. The internal  auditory canals are within normal limits. Vascular: Abnormal signal is present within the right internal carotid artery proximal to the ophthalmic segment. Flow is present in the left internal carotid artery and both vertebral arteries. Skull and upper cervical spine: The craniocervical junction is normal. Upper cervical spine is within normal limits. Marrow signal is unremarkable. Sinuses/Orbits: The paranasal sinuses and mastoid air cells are clear. The globes and orbits are within normal limits. IMPRESSION: 1. No acute intracranial abnormality. 2. Chronic encephalomalacia in the right MCA territory with sparing of the right temporal lobe. 3. Abnormal signal within the right internal carotid artery proximal to the ophthalmic segment. This likely reflects chronic occlusion. 4. White matter changes as described likely reflect the sequela of chronic microvascular ischemia. Electronically Signed   By: Lonni Necessary M.D.   On: 11/06/2023 18:14   CT HEAD WO CONTRAST  Result Date: 11/06/2023 CLINICAL DATA:  Right leg weakness, stroke suspected EXAM: CT HEAD WITHOUT CONTRAST TECHNIQUE: Contiguous axial images were obtained from the base of the skull through the vertex without intravenous contrast. RADIATION DOSE REDUCTION: This exam was performed according to the departmental dose-optimization program which includes automated exposure control, adjustment of the mA and/or kV according to patient size and/or use of iterative reconstruction technique. COMPARISON:  04/15/2020 CT head and MRI head FINDINGS: Brain: Extensive hypodensity in the right cerebral hemisphere, which is new from the prior CT; some of this likely correlates with evolution of the acute infarcts noted on 04/15/2020, although the area involved is greater than would be expected. Within this area, there are foci of slightly increased density (series 2, images 24 and 25 close), which could represent hemorrhage or gliosis. Ex vacuo dilatation of the  right lateral ventricle. Well layering degeneration of the right pons and midbrain. No mass, mass effect, or midline shift. No hydrocephalus or extra-axial collection. Vascular: No hyperdense vessel. Skull: Negative for fracture or focal lesion. Sinuses/Orbits: No acute finding. Other: The mastoid air cells are well aerated. IMPRESSION: Extensive hypodensity in the right cerebral hemisphere, which is new from the prior CT; some of this likely correlates with evolution of the acute infarcts noted on 04/15/2020, although the area involved is greater than would be expected. Within this area, there are foci of slightly increased density, which could represent hemorrhage or gliosis. An MRI of the brain is recommended for further evaluation. These results were called by telephone at the time of interpretation on 11/06/2023 at 3:09 pm to provider [place], who verbally acknowledged these results. Electronically Signed   By: Donald Campion M.D.   On: 11/06/2023 15:09        Scheduled Meds:  insulin  aspart  0-9 Units Subcutaneous TID WC   metoprolol  succinate  25 mg Oral Daily   Continuous Infusions:  sodium chloride  100 mL/hr at 11/07/23 1039     LOS: 0 days    Brayton Lye, MD [facility]   To contact the attending provider between 7A-7P or the covering provider during after hours 7P-7A, please log into the web site [email or web address] and access using universal [place] password for that web site. If you do not have the password, please call the hospital operator.  11/07/2023, 3:48 PM

## 2023-11-07 NOTE — Consult Note (Signed)
 OB/GYN Consult Note  Referring Provider: Dr. Venetta Bosworth Matthews is a 43 y.o. G1P0010  admitted for acute renal failure superimposed on stage 4 chronic kidney disease. 2 day history of right sided weakness found to have AKI on CKD. Reportedly had onset of AKI in August thought due to new pelvic mass. Admitted to hospitalist service.   OB/Gyn consulted for pelvic mass noted on US  and CT.   History obtained from patient and two aunts in room with her.   Patient reports two days of right sided weakness. Patient reports she was diagnosed with pelvic mass in August of last year and was told to follow up with Gyn but she has not been able to. Aunt reports her stomach has been increasing in size since last summer. She reports a pap smear at some point in the past but not sure when and reports it has been many years since she had a Gyn exam. Remote h/o a miscarriage. Reports she has monthly periods, lasting 5ish days, sometimes heavier, sometimes lighter. Not on any kind of hormonal management for periods.       Past Medical History:  Diagnosis Date   Diabetes mellitus without complication (HCC)    Hyperlipidemia    Hypertension    Loop recorder [employer] 12/14/2020 12/14/2020   Scheduled Remote loop recorder check 12/14/2020: NSR. New implant.    Stroke [facility])    TIA (transient ischemic attack) 01/22/2019    Past Surgical History:  Procedure Laterality Date   NO PAST SURGERIES      OB History  Gravida Para Term Preterm AB Living  1    1   SAB IAB Ectopic Multiple Live Births  1        # Outcome Date GA Lbr Len/2nd Weight Sex Type Anes PTL Lv  1 SAB             Social History   Socioeconomic History   Marital status: Single    Spouse name: Not on file   Number of children: 0   Years of education: Not on file   Highest education level: Not on file  Occupational History   Not on file  Tobacco Use   Smoking status: Former    Current packs/day: 0.00    Average  packs/day: 1 pack/day for 15.0 years (15.0 ttl pk-yrs)    Types: Cigarettes    Start date: 2006    Quit date: 2021    Years since quitting: 4.1   Smokeless tobacco: Never  Vaping Use   Vaping status: Never Used  Substance and Sexual Activity   Alcohol use: No   Drug use: Not Currently    Types: Marijuana, Cocaine    Comment: occasional   Sexual activity: Yes    Birth control/protection: None  Other Topics Concern   Not on file  Social History Narrative   Right handed   Lives with friend one story home   Social Drivers of Health   Financial Resource Strain: Not on file  Food Insecurity: No Food Insecurity (11/06/2023)   Hunger Vital Sign    Worried About Running Out of Food in the Last Year: Never true    Ran Out of Food in the Last Year: Never true  Transportation Needs: No Transportation Needs (11/06/2023)   PRAPARE - Administrator, Civil Service (Medical): No    Lack of Transportation (Non-Medical): No  Physical Activity: Not on file  Stress: Not on file  Social Connections: Not  on file    Family History  Problem Relation Age of Onset   Diabetes Other    Hypertension Maternal Grandmother     Medications Prior to Admission  Medication Sig Dispense Refill Last Dose/Taking   aspirin  EC 81 MG tablet Take 81 mg by mouth daily. Swallow whole.   11/06/2023 Morning   clopidogrel  (PLAVIX ) 75 MG tablet Take 1 tablet (75 mg total) by mouth daily. 30 tablet 0 11/06/2023 Morning   empagliflozin (JARDIANCE) 10 MG TABS tablet Take 10 mg by mouth at bedtime.   11/05/2023 Bedtime   LANTUS  SOLOSTAR 100 UNIT/ML Solostar Pen Inject 10 Units into the skin daily.   11/05/2023 Morning   metFORMIN  (GLUCOPHAGE ) 1000 MG tablet Take 1 tablet (1,000 mg total) by mouth 2 (two) times daily. 60 tablet 0 11/06/2023 Morning   metoprolol  succinate (TOPROL -XL) 25 MG 24 hr tablet TAKE 1 TABLET BY MOUTH ONCE DAILY. HOLD IF BLOOD PRESSURE NUMBER LESS THAN OR HEART RATE LESS THAN 60 BPM(PULSE) 90  tablet 3 11/06/2023 Morning   rosuvastatin  (CRESTOR ) 40 MG tablet Take 40 mg by mouth at bedtime.   11/05/2023 Bedtime   BD PEN NEEDLE MICRO U/F 32G X 6 MM MISC Inject into the skin as directed.      cephALEXin  (KEFLEX ) 500 MG capsule Take 1 capsule (500 mg total) by mouth 4 (four) times daily. (Patient not taking: Reported on 11/06/2023) 28 capsule 0 Not Taking   fenofibrate  (TRICOR ) 145 MG tablet Take 1 tablet (145 mg total) by mouth daily. 90 tablet 0    nitroGLYCERIN  (NITROSTAT ) 0.4 MG SL tablet Place 1 tablet (0.4 mg total) under the tongue every 5 (five) minutes as needed for chest pain. If you require more than two tablets five minutes apart go to the nearest ER via EMS. 30 tablet 0     No Known Allergies  Review of Systems: Negative except for what is mentioned in HPI.     Physical Exam: BP 138/86 (BP Location: Right Arm)   Pulse 93   Temp 98.5 F (36.9 C) (Oral)   Resp 20   Ht 5' 7 (1.702 m)   LMP 10/03/2023 (Exact Date)   SpO2 98%   BMI 32.56 kg/m  CONSTITUTIONAL: Well-developed, well-nourished female in no acute distress.  HENT:  Normocephalic, atraumatic, External right and left ear normal. Oropharynx is clear and moist EYES: Conjunctivae and EOM are normal. Pupils are equal, round, and reactive to light. No scleral icterus.  NECK: Normal range of motion, supple, no masses SKIN: Skin is warm and dry. No rash noted. Not diaphoretic. No erythema. No pallor. NEUROLGIC: Alert and oriented to person, place, and time. Normal reflexes, muscle tone coordination. No cranial nerve deficit noted. PSYCHIATRIC: Normal mood and affect. Normal behavior. Normal judgment and thought content. CARDIOVASCULAR: normal heart rate noted RESPIRATORY: Effort normal, no problems with respiration noted ABDOMEN: Soft, nontender, nondistended PELVIC: deferred MUSCULOSKELETAL: unable to assess, laying in bed on arrival. No edema and no tenderness.   Pertinent Labs/Studies:   Results for orders placed  or performed during the hospital encounter of 11/06/23 (from the past 72 hours)  CBC     Status: Abnormal   Collection Time: 11/06/23  1:32 PM  Result Value Ref Range   WBC 6.0 4.0 - 10.5 K/uL   RBC 4.35 3.87 - 5.11 MIL/uL   Hemoglobin 11.6 (L) 12.0 - 15.0 g/dL   HCT 62.4 63.9 - 53.9 %   MCV 86.2 80.0 - 100.0 fL  MCH 26.7 26.0 - 34.0 pg   MCHC 30.9 30.0 - 36.0 g/dL   RDW 82.5 (H) 88.4 - 84.4 %   Platelets PLATELET CLUMPS NOTED ON SMEAR, UNABLE TO ESTIMATE 150 - 400 K/uL   nRBC 0.0 0.0 - 0.2 %    Comment: Performed at [facility], [address], [place], [place] [address]  Differential     Status: None   Collection Time: 11/06/23  1:32 PM  Result Value Ref Range   Neutrophils Relative % 61 %   Neutro Abs 3.8 1.7 - 7.7 K/uL   Lymphocytes Relative 26 %   Lymphs Abs 1.5 0.7 - 4.0 K/uL   Monocytes Relative 9 %   Monocytes Absolute 0.5 0.1 - 1.0 K/uL   Eosinophils Relative 2 %   Eosinophils Absolute 0.1 0.0 - 0.5 K/uL   Basophils Relative 1 %   Basophils Absolute 0.0 0.0 - 0.1 K/uL   WBC Morphology MORPHOLOGY UNREMARKABLE    RBC Morphology MORPHOLOGY UNREMARKABLE    Smear Review PLATELET CLUMPS NOTED ON SMEAR, UNABLE TO ESTIMATE    Immature Granulocytes 1 %   Abs Immature Granulocytes 0.03 0.00 - 0.07 K/uL    Comment: Performed at [facility], [address] [place], [place] [address]  Comprehensive metabolic panel     Status: Abnormal   Collection Time: 11/06/23  1:32 PM  Result Value Ref Range   Sodium 139 135 - 145 mmol/L   Potassium 4.2 3.5 - 5.1 mmol/L   Chloride 110 98 - 111 mmol/L   CO2 16 (L) 22 - 32 mmol/L   Glucose, Bld 140 (H) 70 - 99 mg/dL    Comment: Glucose reference range applies only to samples taken after fasting for at least 8 hours.   BUN 35 (H) 6 - 20 mg/dL   Creatinine, Ser 6.00 (H) 0.44 - 1.00 mg/dL   Calcium  9.5 8.9 - 10.3 mg/dL   Total Protein 6.6 6.5 - 8.1 g/dL   Albumin 3.4 (L) 3.5 - 5.0 g/dL   AST 618 (H) 15 - 41 U/L   ALT 255 (H)  0 - 44 U/L   Alkaline Phosphatase 59 38 - 126 U/L   Total Bilirubin 0.7 0.0 - 1.2 mg/dL   GFR, Estimated 14 (L) >60 mL/min    Comment: (NOTE) Calculated using the CKD-EPI Creatinine Equation (2021)    Anion gap 13 5 - 15    Comment: Performed at [facility], [address], [place], [place] [address]  Ethanol     Status: None   Collection Time: 11/06/23  1:32 PM  Result Value Ref Range   Alcohol, Ethyl (B) <10 <10 mg/dL    Comment: (NOTE) Lowest detectable limit for serum alcohol is 10 mg/dL.  For medical purposes only. Performed at [facility], [address], [place], [place] [address]   hCG, serum, qualitative     Status: None   Collection Time: 11/06/23  1:32 PM  Result Value Ref Range   Preg, Serum NEGATIVE NEGATIVE    Comment:        THE SENSITIVITY OF THIS METHODOLOGY IS >10 mIU/mL. Performed at [facility], [address], [place], [place] [address]   CBG monitoring, ED     Status: Abnormal   Collection Time: 11/06/23  1:35 PM  Result Value Ref Range   Glucose-Capillary 132 (H) 70 - 99 mg/dL    Comment: Glucose reference range applies only to samples  taken after fasting for at least 8 hours.  I-stat chem 8, ED     Status: Abnormal   Collection Time: 11/06/23  1:38 PM  Result Value Ref Range   Sodium 141 135 - 145 mmol/L   Potassium 4.2 3.5 - 5.1 mmol/L   Chloride 111 98 - 111 mmol/L   BUN 40 (H) 6 - 20 mg/dL   Creatinine, Ser 5.49 (H) 0.44 - 1.00 mg/dL   Glucose, Bld 860 (H) 70 - 99 mg/dL    Comment: Glucose reference range applies only to samples taken after fasting for at least 8 hours.   Calcium , Ion 1.11 (L) 1.15 - 1.40 mmol/L   TCO2 20 (L) 22 - 32 mmol/L   Hemoglobin 12.2 12.0 - 15.0 g/dL   HCT 63.9 63.9 - 53.9 %  Platelet count     Status: None   Collection Time: 11/06/23  3:58 PM  Result Value Ref Range   Platelets 259 150 - 400 K/uL    Comment: Performed at [facility], [address] [place], [place] [address]   Urinalysis, Complete w Microscopic -Urine, Clean Catch     Status: Abnormal   Collection Time: 11/06/23  4:55 PM  Result Value Ref Range   Color, Urine YELLOW YELLOW   APPearance HAZY (A) CLEAR   Specific Gravity, Urine 1.008 1.005 - 1.030   pH 6.0 5.0 - 8.0   Glucose, UA >=500 (A) NEGATIVE mg/dL   Hgb urine dipstick LARGE (A) NEGATIVE   Bilirubin Urine NEGATIVE NEGATIVE   Ketones, ur NEGATIVE NEGATIVE mg/dL   Protein, ur 899 (A) NEGATIVE mg/dL   Nitrite NEGATIVE NEGATIVE   Leukocytes,Ua NEGATIVE NEGATIVE   RBC / HPF 0-5 0 - 5 RBC/hpf   WBC, UA 0-5 0 - 5 WBC/hpf   Bacteria, UA RARE (A) NONE SEEN   Squamous Epithelial / HPF 0-5 0 - 5 /HPF   Mucus PRESENT     Comment: Performed at [facility], [address], [place], [place] [address]  Rapid urine drug screen (hospital performed)     Status: None   Collection Time: 11/06/23  4:56 PM  Result Value Ref Range   Opiates NONE DETECTED NONE DETECTED   Cocaine NONE DETECTED NONE DETECTED   Benzodiazepines NONE DETECTED NONE DETECTED   Amphetamines NONE DETECTED NONE DETECTED   Tetrahydrocannabinol NONE DETECTED NONE DETECTED   Barbiturates NONE DETECTED NONE DETECTED    Comment: (NOTE) DRUG SCREEN FOR MEDICAL PURPOSES ONLY.  IF CONFIRMATION IS NEEDED FOR ANY PURPOSE, NOTIFY LAB WITHIN 5 DAYS.  LOWEST DETECTABLE LIMITS FOR URINE DRUG SCREEN Drug Class                     Cutoff (ng/mL) Amphetamine and metabolites    1000 Barbiturate and metabolites    200 Benzodiazepine                 200 Opiates and metabolites        300 Cocaine and metabolites        300 THC                            50 Performed at [facility], [address] [place], [place] [address]   CBG monitoring, ED     Status: Abnormal   Collection Time: 11/06/23  5:10 PM  Result Value Ref Range   Glucose-Capillary 57 (L) 70 -  99 mg/dL    Comment: Glucose reference range applies only to samples taken after fasting for at least 8 hours.  CBG  monitoring, ED     Status: None   Collection Time: 11/06/23  7:13 PM  Result Value Ref Range   Glucose-Capillary 70 70 - 99 mg/dL    Comment: Glucose reference range applies only to samples taken after fasting for at least 8 hours.  Protime-INR     Status: None   Collection Time: 11/06/23  9:38 PM  Result Value Ref Range   Prothrombin Time 13.9 11.4 - 15.2 seconds   INR 1.1 0.8 - 1.2    Comment: (NOTE) INR goal varies based on device and disease states. Performed at [facility], [address] [place], [place] [address]   APTT     Status: None   Collection Time: 11/06/23  9:38 PM  Result Value Ref Range   aPTT 33 24 - 36 seconds    Comment: Performed at [facility], [address] [place], [place] [address]  Hepatitis panel, acute     Status: None   Collection Time: 11/06/23  9:38 PM  Result Value Ref Range   Hepatitis B Surface Ag NON REACTIVE NON REACTIVE   HCV Ab NON REACTIVE NON REACTIVE    Comment: (NOTE) Nonreactive HCV antibody screen is consistent with no HCV infections,  unless recent infection is suspected or other evidence exists to indicate HCV infection.     Hep A IgM NON REACTIVE NON REACTIVE   Hep B C IgM NON REACTIVE NON REACTIVE    Comment: Performed at [facility], [address], [place], [place] [address]  Acetaminophen  level     Status: Abnormal   Collection Time: 11/06/23  9:38 PM  Result Value Ref Range   Acetaminophen  (Tylenol ), Serum <10 (L) 10 - 30 ug/mL    Comment: (NOTE) Therapeutic concentrations vary significantly. A range of 10-30 ug/mL  may be an effective concentration for many patients. However, some  are best treated at concentrations outside of this range. Acetaminophen  concentrations >150 ug/mL at 4 hours after ingestion  and >50 ug/mL at 12 hours after ingestion are often associated with  toxic reactions.  Performed at [facility], [address] [place], [place] [address]   Hemoglobin A1c      Status: Abnormal   Collection Time: 11/06/23  9:38 PM  Result Value Ref Range   Hgb A1c MFr Bld 6.4 (H) 4.8 - 5.6 %    Comment: (NOTE) Pre diabetes:          5.7%-6.4%  Diabetes:              >6.4%  Glycemic control for   <7.0% adults with diabetes    Mean Plasma Glucose 136.98 mg/dL    Comment: Performed at [facility], [address], [place], [place] [address]  HIV Antibody (routine testing w rflx)     Status: None   Collection Time: 11/06/23  9:38 PM  Result Value Ref Range   HIV Screen 4th Generation wRfx Non Reactive Non Reactive    Comment: Performed at [facility], [address], [place], [place] [address]  TSH     Status: None   Collection Time: 11/06/23  9:38 PM  Result Value Ref Range   TSH 2.506 0.350 - 4.500 uIU/mL    Comment: Performed by a 3rd Generation assay with a functional sensitivity of <=  0.01 uIU/mL. Performed at [facility], [address], [place], [place] [address]   Vitamin B12     Status: Abnormal   Collection Time: 11/06/23  9:38 PM  Result Value Ref Range   Vitamin B-12 111 (L) 180 - 914 pg/mL    Comment: (NOTE) This assay is not validated for testing neonatal or myeloproliferative syndrome specimens for Vitamin B12 levels. Performed at [facility], [address], [place], [place] [address]   CK     Status: Abnormal   Collection Time: 11/06/23  9:38 PM  Result Value Ref Range   Total CK 23,267 (H) 38 - 234 U/L    Comment: RESULTS CONFIRMED BY MANUAL DILUTION Performed at [facility], [address], [place], [place] [address]   Basic metabolic panel     Status: Abnormal   Collection Time: 11/07/23  5:21 AM  Result Value Ref Range   Sodium 138 135 - 145 mmol/L   Potassium 3.8 3.5 - 5.1 mmol/L   Chloride 109 98 - 111 mmol/L   CO2 18 (L) 22 - 32 mmol/L   Glucose, Bld 81 70 - 99 mg/dL    Comment: Glucose reference range applies only to samples taken after fasting for at least 8 hours.   BUN 38 (H) 6 - 20  mg/dL   Creatinine, Ser 5.75 (H) 0.44 - 1.00 mg/dL   Calcium  9.0 8.9 - 10.3 mg/dL   GFR, Estimated 13 (L) >60 mL/min    Comment: (NOTE) Calculated using the CKD-EPI Creatinine Equation (2021)    Anion gap 11 5 - 15    Comment: ELECTROLYTES REPEATED TO VERIFY Performed at [facility], [address] [place] [address], [place], [place] [address]   CBC     Status: Abnormal   Collection Time: 11/07/23  5:21 AM  Result Value Ref Range   WBC 7.3 4.0 - 10.5 K/uL   RBC 3.70 (L) 3.87 - 5.11 MIL/uL   Hemoglobin 9.9 (L) 12.0 - 15.0 g/dL   HCT 68.5 (L) 63.9 - 53.9 %   MCV 84.9 80.0 - 100.0 fL   MCH 26.8 26.0 - 34.0 pg   MCHC 31.5 30.0 - 36.0 g/dL   RDW 82.6 (H) 88.4 - 84.4 %   Platelets 264 150 - 400 K/uL   nRBC 0.0 0.0 - 0.2 %    Comment: Performed at [facility], [address], [place], [place] [address]  Glucose, capillary     Status: Abnormal   Collection Time: 11/07/23 12:32 PM  Result Value Ref Range   Glucose-Capillary 121 (H) 70 - 99 mg/dL    Comment: Glucose reference range applies only to samples taken after fasting for at least 8 hours.       Assessment and Plan :Ebony Matthews is a 43 y.o. G1P0010 admitted for right sided weakness, AKI on CKD. Large pelvic mass, favor ovarian in origin with concern for potential compression contributing to AKI, however no hydronephrosis on CT.   Reviewed pelvic imaging, concern for ovarian in origin Rec MRI pelvis per radiology for further evaluation Ca-125 ordered   Thank you for this consult, we will follow along.   For OB/GYN questions, please call the Center for [facility] at [facility] Monday - Friday, 8 am - 5 pm: [phone] All other times: [phone]    K. Yolanda Moats, M.D. Attending Obstetrician & Gynecologist, [facility] [employer], [facility]

## 2023-11-07 NOTE — Consult Note (Signed)
 Renal Service Consult Note [facility]  Ebony Matthews 11/07/2023 Ebony JONETTA Fret, Ebony Matthews Requesting Physician: Dr. Sherlon  Reason for Consult: Renal failure HPI: The patient is a 43 y.o. year-old w/ PMH as below who presented to ED 2/07 yesterday c/o R leg weakness for several weeks but worse the last 2 days. Hx CVA w/ L sided deficits. Had a fall on 2/02 and was seen at [facility] ED and her R leg was just as weak then. Normally able to get around the house, but currently can't ambulate.   Pt seen in room. States she lives alone and uses a rolling walker and a wheelchair to get around the house. Her aunt brings her food from the grocery store. She has chronic L sided weakness from an old stroke.  This admission her R quad area has been painful and hurting and making it difficult to move/ bend her R leg. She had a period of time recently when she was lying on the floor on the R side. Pt denies any hx of kidney problems.  She says she sees her PCP at the [facility] on [address] at least every year.   Prior hospital admits: April 2020 - TIA, CTA neg for LVO, MRI showed multifocal acute ischemia. TEE not done due to COVID-19. Pt not taking meds for last year, didn't have PCP. Neuro decided against loop recorder. HTN on acei, smoker, drug abuse cocaine/ THC. DM2, not taking meds for last yr. Dc on metformin .  June 2021 - presented with L arm weakness / numbness. W/u showed acute infarcts involving L occipital lobe and inferior R cerebellum. ECHO showed EF 60-65%, LVH, G1DD. No intracardiac cause for embolism. CTA head w/ any LVO. Pt was still using cocaine, marijuana and cigarettes. Per neuro was not candidate for loop recorder or TEE due to medical nonadherence. Pt dc'd on lisinopril , glipizide  and metformin , asa, plavix  x 3 wks then asa.  July 2021 - pt presented w/ LUE weakness. Pt was noncompliant w/ her prior meds as above. ECHO was unchanged. CT showed 1.6x 1.3 cm acute CVA in R frontal  lobe. New MRI showed  small CVA in R pon and multiple new infracts in the R hemisphere. CTA head/ neck showed new R ICA occlusion that was new. Pt was dc'd and again counseled to take all her meidcation.s    ROS - denies CP, no joint pain, no HA, no blurry vision, no rash, no diarrhea, no nausea/ vomiting, no dysuria, no difficulty voiding   Past Medical History  Past Medical History:  Diagnosis Date   Diabetes mellitus without complication (HCC)    Hyperlipidemia    Hypertension    Loop recorder [employer] 12/14/2020 12/14/2020   Scheduled Remote loop recorder check 12/14/2020: NSR. New implant.    Stroke [facility])    TIA (transient ischemic attack) 01/22/2019   Past Surgical History  Past Surgical History:  Procedure Laterality Date   NO PAST SURGERIES     Family History  Family History  Problem Relation Age of Onset   Diabetes Other    Hypertension Maternal Grandmother    Social History  reports that she quit smoking about 4 years ago. Her smoking use included cigarettes. She started smoking about 19 years ago. She has a 15 pack-year smoking history. She has never used smokeless tobacco. She reports that she does not currently use drugs after having used the following drugs: Marijuana and Cocaine. She reports that she does not drink alcohol. Allergies  No Known Allergies Home medications Prior to Admission medications   Medication Sig Start Date End Date Taking? Authorizing Provider  aspirin  EC 81 MG tablet Take 81 mg by mouth daily. Swallow whole.   Yes Provider, Historical, Ebony Matthews  clopidogrel  (PLAVIX ) 75 MG tablet Take 1 tablet (75 mg total) by mouth daily. 09/25/20  Yes Jaffe, Adam R, DO  empagliflozin (JARDIANCE) 10 MG TABS tablet Take 10 mg by mouth at bedtime.   Yes Provider, Historical, Ebony Matthews  LANTUS  SOLOSTAR 100 UNIT/ML Solostar Pen Inject 10 Units into the skin daily. 08/13/22  Yes Provider, Historical, Ebony Matthews  metFORMIN  (GLUCOPHAGE ) 1000 MG tablet Take 1 tablet (1,000 mg total) by  mouth 2 (two) times daily. 03/04/20  Yes Jens Durand, Ebony Matthews  metoprolol  succinate (TOPROL -XL) 25 MG 24 hr tablet TAKE 1 TABLET BY MOUTH ONCE DAILY. HOLD IF BLOOD PRESSURE NUMBER LESS THAN OR HEART RATE LESS THAN 60 BPM(PULSE) 10/02/22  Yes Tolia, Sunit, DO  rosuvastatin  (CRESTOR ) 40 MG tablet Take 40 mg by mouth at bedtime. 09/06/23  Yes Provider, Historical, Ebony Matthews  BD PEN NEEDLE MICRO U/F 32G X 6 MM MISC Inject into the skin as directed. 08/13/22   Provider, Historical, Ebony Matthews  cephALEXin  (KEFLEX ) 500 MG capsule Take 1 capsule (500 mg total) by mouth 4 (four) times daily. Patient not taking: Reported on 11/06/2023 05/19/23   Patsey Lot, Ebony Matthews  fenofibrate  (TRICOR ) 145 MG tablet Take 1 tablet (145 mg total) by mouth daily. 06/11/23 09/09/23  Tolia, Sunit, DO  nitroGLYCERIN  (NITROSTAT ) 0.4 MG SL tablet Place 1 tablet (0.4 mg total) under the tongue every 5 (five) minutes as needed for chest pain. If you require more than two tablets five minutes apart go to the nearest ER via EMS. 12/21/20 05/21/23  Michele Richardson, DO     Vitals:   11/07/23 0910 11/07/23 0912 11/07/23 1100 11/07/23 1212  BP:  123/72  138/86  Pulse:      Resp: 15 14 (!) 21 20  Temp: 98.2 F (36.8 C) 98.2 F (36.8 C)  98.5 F (36.9 C)  TempSrc: Oral Oral  Oral  SpO2:      Height:       Exam Gen alert, no distress No rash, cyanosis or gangrene Sclera anicteric, throat clear  No jvd or bruits Chest clear bilat to bases, no rales/ wheezing RRR no MRG Abd soft ntnd no mass or ascites +bs GU defer MS   R thigh mildly tender to touch, no erythema and no edema Ext no LE or UE edema, no other edema Neuro is alert, Ox 3 , LUE and LLE chronic weakness     Renal-related home meds: - toprol  xl 25 every day - others: empagliflozin, metformin , fenofibrate , statin, lantus  insulin , plavix / asa  Date   Creat  eGFR (ml/min) 2008- 2019  0.61- 0.90 > 60 ml/min 2020   0.87- 1.03 > 60 ml/min 2021   0.70- 0.89 > 60  ml/min 05/19/23  2.93  20 ml/min  11/01/23  2.18  28 ml/min 11/06/23  3.99, 4.50 14 11/07/23  4.24  13   Assessment/ Plan: AKI on CKD 4 - b/l creat 2.1- 2.9 from aug 2024 to feb 2025, eGFR 20-28 ml/min. Creat here is 3.99 on admit, went up to 4.5 and is down 4.2 today w/ IVF's. CPK is 23K and R upper leg is painful suggesting crush injury to the R leg causing acute rhabdomyolysis. In epic there is a 3 year gap of renal labs, hopefully we can fill  this in w/ labs from her PCP's office at [facility] next week. UA shows small amt protein, no sig rbc/wbc's, large Hb (which is likely myoglobin). CT shows normal appearing kidneys w/o obstruction. Large pelvic mass/cyst does not appear to obstructing the bladder. Pt is voiding w/ a purewick in place. Suspect the AKI is due to acute rhabdomyolysis. CKD is most likely due to uncont HTN/ DM2. Pt has room for vol by exam -- > will bolus 2.5L and cont IVF's at 75 cc/hr. Otherwise rx is supportive care.  Acute rhabdomyolysis - crush injury to R leg most likely. Supportive care, cont to hydrate generously.  DM2 - on insulin  HTN - cont metoprolol  xl, BP's stable H/o L hemiparesis - from prior CVA's      Ebony Fret  Ebony Matthews CKA 11/07/2023, 6:46 PM  Recent Labs  Lab 11/01/23 1534 11/06/23 1332 11/06/23 1338 11/07/23 0521  HGB 11.7* 11.6* 12.2 9.9*  ALBUMIN 3.7 3.4*  --   --   CALCIUM  9.3 9.5  --  9.0  CREATININE 2.18* 3.99* 4.50* 4.24*  K 3.4* 4.2 4.2 3.8   Inpatient medications:  heparin  injection (subcutaneous)  5,000 Units Subcutaneous Q8H   insulin  aspart  0-9 Units Subcutaneous TID WC   metoprolol  succinate  25 mg Oral Daily    cyclobenzaprine , ondansetron  **OR** ondansetron  (ZOFRAN ) IV

## 2023-11-07 NOTE — Evaluation (Signed)
 Physical Therapy Evaluation Patient Details Name: Ebony Matthews MRN: [id number] DOB: Feb 19, 1981 Today's Date: 11/07/2023  History of Present Illness  43 y.o. female presents to [facility] hospital on 11/06/2023 with new R weakness. CT abdomen with finding of a large cystic mass in the pelvis. PMH includes DMII, CVA w/ L hemiplegia, HTN, cocaine use.  Clinical Impression  Pt presents to PT with deficits in functional mobility, strength, power, endurance, gait, balance. Pt with chronic L hemiplegia from prior CVA but now with new R weakness and R thigh pain which limit mobility. Pt is unable to tolerate R knee flexion over ~95 degrees and pt's RLE extends with transfer attempts and is unable to stand at this time. Pt reports independence with household mobility prior to this admission. Patient will benefit from continued inpatient follow up therapy, <3 hours/day.        If plan is discharge home, recommend the following: Two people to help with walking and/or transfers;Two people to help with bathing/dressing/bathroom;Assistance with cooking/housework;Direct supervision/assist for medications management;Direct supervision/assist for financial management;Assist for transportation;Help with stairs or ramp for entrance   Can travel by private vehicle   No    Equipment Recommendations Wheelchair (measurements PT);Hoyer lift;Hospital bed  Recommendations for Other Services       Functional Status Assessment Patient has had a recent decline in their functional status and demonstrates the ability to make significant improvements in function in a reasonable and predictable amount of time.     Precautions / Restrictions Precautions Precautions: Fall Precaution Comments: chronic L hemiplegia Restrictions Weight Bearing Restrictions Per Provider Order: No      Mobility  Bed Mobility Overal bed mobility: Needs Assistance Bed Mobility: Supine to Sit, Sit to Supine     Supine to sit: Max assist, HOB  elevated, Used rails Sit to supine: Max assist, HOB elevated        Transfers Overall transfer level: Needs assistance   Transfers: Sit to/from Stand Sit to Stand:  (attempted sit to stand but pt is unable to sufficiently flex R knee and R foot slides anteriorly, no acitvation of LLE noted)                Ambulation/Gait                  Stairs            Wheelchair Mobility     Tilt Bed    Modified Rankin (Stroke Patients Only)       Balance Overall balance assessment: Needs assistance Sitting-balance support: Single extremity supported, Feet supported Sitting balance-Leahy Scale: Poor Sitting balance - Comments: maxA initially due to posterior lean, improves to close supervision by end of session Postural control: Posterior lean                                   Pertinent Vitals/Pain Pain Assessment Pain Assessment: Faces Faces Pain Scale: Hurts even more Pain Location: R thigh with knee flexion Pain Descriptors / Indicators: Sore Pain Intervention(s): Monitored during session    Home Living Family/patient expects to be discharged to:: Private residence Living Arrangements: Alone Available Help at Discharge: Friend(s);Family;Available PRN/intermittently (aunt) Type of Home: Apartment Home Access: Level entry       Home Layout: One level Home Equipment: Architectural Technologist (4 wheels);Other (comment);Tub bench;BSC/3in1 (hemiwalker)      Prior Function Prior Level of Function : Needs assist  Mobility Comments: ambulatory with rollator in the apartment, quad cane in the community. ADLs Comments: aunt assists with transportation, pt reports independence otherwise     Extremity/Trunk Assessment   Upper Extremity Assessment Upper Extremity Assessment: LUE deficits/detail;RUE deficits/detail RUE Deficits / Details: ROM WFL, grossly 4/5 LUE Deficits / Details: pt reports no mobility from LUE initially. Pt  later able to demonstrate 2/5 shoulder abduction in sitting, no digit/wrist/elbow AROM noted    Lower Extremity Assessment Lower Extremity Assessment: RLE deficits/detail;LLE deficits/detail RLE Deficits / Details: pt with 4-/5 knee extension, knee flexion PROM limited to ~95 degrees due to reports of R thigh pain. Ankle ROM WFL LLE Deficits / Details: no AROM noted from LLE other than a flicker of hip adduction with bed mobility    Cervical / Trunk Assessment Cervical / Trunk Assessment: Kyphotic  Communication   Communication Communication: No apparent difficulties Cueing Techniques: Verbal cues  Cognition Arousal: Alert Behavior During Therapy: Flat affect Overall Cognitive Status: Impaired/Different from baseline Area of Impairment: Problem solving                             Problem Solving: Slow processing, Decreased initiation          General Comments General comments (skin integrity, edema, etc.): VSS on RA    Exercises     Assessment/Plan    PT Assessment Patient needs continued PT services  PT Problem List Decreased strength;Decreased activity tolerance;Decreased balance;Decreased mobility;Decreased knowledge of use of DME;Decreased knowledge of precautions;Decreased safety awareness;Pain       PT Treatment Interventions DME instruction;Gait training;Functional mobility training;Therapeutic activities;Therapeutic exercise;Balance training;Neuromuscular re-education;Cognitive remediation;Patient/family education;Wheelchair mobility training    PT Goals (Current goals can be found in the Care Plan section)  Acute Rehab PT Goals Patient Stated Goal: to improve R side strength, reduce falls risk PT Goal Formulation: With patient Time For Goal Achievement: 11/21/23 Potential to Achieve Goals: Fair    Frequency Min 1X/week     Co-evaluation               AM-PAC PT 6 Clicks Mobility  Outcome Measure Help needed turning from your back to  your side while in a flat bed without using bedrails?: A Lot Help needed moving from lying on your back to sitting on the side of a flat bed without using bedrails?: A Lot Help needed moving to and from a bed to a chair (including a wheelchair)?: Total Help needed standing up from a chair using your arms (e.g., wheelchair or bedside chair)?: Total Help needed to walk in hospital room?: Total Help needed climbing 3-5 steps with a railing? : Total 6 Click Score: 8    End of Session Equipment Utilized During Treatment: Gait belt Activity Tolerance: Patient limited by pain Patient left: in bed;with call bell/phone within reach;with bed alarm set Nurse Communication: Mobility status;Need for lift equipment PT Visit Diagnosis: Other abnormalities of gait and mobility (R26.89);Muscle weakness (generalized) (M62.81);Other symptoms and signs involving the nervous system (R29.898)    Time: 8557-8487 PT Time Calculation (min) (ACUTE ONLY): 30 min   Charges:   PT Evaluation $PT Eval Low Complexity: 1 Low   PT General Charges $$ ACUTE PT VISIT: 1 Visit         Bernardino JINNY Ruth, PT, DPT Acute Rehabilitation Office [phone]   Bernardino JINNY Ruth 11/07/2023, 4:05 PM

## 2023-11-07 NOTE — Care Management Obs Status (Signed)
 MEDICARE OBSERVATION STATUS NOTIFICATION   Patient Details  Name: Ebony Matthews MRN: [id number] Date of Birth: October 14, 1980   Medicare Observation Status Notification Given:  Yes    Omie Bickers, RN 11/07/2023, 9:06 AM

## 2023-11-08 DIAGNOSIS — T796XXS Traumatic ischemia of muscle, sequela: Secondary | ICD-10-CM

## 2023-11-08 DIAGNOSIS — R1900 Intra-abdominal and pelvic swelling, mass and lump, unspecified site: Secondary | ICD-10-CM | POA: Diagnosis not present

## 2023-11-08 DIAGNOSIS — N179 Acute kidney failure, unspecified: Secondary | ICD-10-CM | POA: Diagnosis not present

## 2023-11-08 DIAGNOSIS — N184 Chronic kidney disease, stage 4 (severe): Secondary | ICD-10-CM | POA: Diagnosis not present

## 2023-11-08 LAB — GLUCOSE, CAPILLARY
Glucose-Capillary: 96 mg/dL (ref 70–99)
Glucose-Capillary: 131 mg/dL — ABNORMAL HIGH (ref 70–99)
Glucose-Capillary: 120 mg/dL — ABNORMAL HIGH (ref 70–99)
Glucose-Capillary: 203 mg/dL — ABNORMAL HIGH (ref 70–99)

## 2023-11-08 LAB — CK: Total CK: 15852 U/L — ABNORMAL HIGH (ref 38–234)

## 2023-11-08 LAB — CBC
HCT: 31.3 % — ABNORMAL LOW (ref 36.0–46.0)
Hemoglobin: 10 g/dL — ABNORMAL LOW (ref 12.0–15.0)
MCH: 26.5 pg (ref 26.0–34.0)
MCHC: 31.9 g/dL (ref 30.0–36.0)
MCV: 83 fL (ref 80.0–100.0)
Platelets: 289 10*3/uL (ref 150–400)
RBC: 3.77 MIL/uL — ABNORMAL LOW (ref 3.87–5.11)
RDW: 17.3 % — ABNORMAL HIGH (ref 11.5–15.5)
WBC: 6.8 10*3/uL (ref 4.0–10.5)
nRBC: 0 % (ref 0.0–0.2)

## 2023-11-08 LAB — COMPREHENSIVE METABOLIC PANEL
ALT: 203 U/L — ABNORMAL HIGH (ref 0–44)
AST: 236 U/L — ABNORMAL HIGH (ref 15–41)
Albumin: 2.7 g/dL — ABNORMAL LOW (ref 3.5–5.0)
Alkaline Phosphatase: 47 U/L (ref 38–126)
Anion gap: 10 (ref 5–15)
BUN: 41 mg/dL — ABNORMAL HIGH (ref 6–20)
CO2: 18 mmol/L — ABNORMAL LOW (ref 22–32)
Calcium: 8.9 mg/dL (ref 8.9–10.3)
Chloride: 111 mmol/L (ref 98–111)
Creatinine, Ser: 4.29 mg/dL — ABNORMAL HIGH (ref 0.44–1.00)
GFR, Estimated: 12 mL/min — ABNORMAL LOW (ref >60)
Glucose, Bld: 102 mg/dL — ABNORMAL HIGH (ref 70–99)
Potassium: 3.8 mmol/L (ref 3.5–5.1)
Sodium: 139 mmol/L (ref 135–145)
Total Bilirubin: 0.6 mg/dL (ref 0.0–1.2)
Total Protein: 5.7 g/dL — ABNORMAL LOW (ref 6.5–8.1)

## 2023-11-08 MED ORDER — LIDOCAINE 5 % EX PTCH
1.0000 | MEDICATED_PATCH | CUTANEOUS | Status: DC
  Administered 2023-11-08 – 2023-11-14 (×7): 1 via TRANSDERMAL
  Filled 2023-11-08 (×7): qty 1

## 2023-11-08 NOTE — TOC Initial Note (Signed)
 Transition of Care [facility]) - Initial/Assessment Note    Patient Details  Name: Ebony Matthews MRN: [id number] Date of Birth: 26-Jun-1981  [facility]) CM/SW Contact:    Ebony Matthews Ebony Reighlynn Swiney, LCSW Phone Number: 11/08/2023, 1:07 PM  Clinical Narrative:                  CSW contacted pt and spoke via phone, she stated that she was agreeable to recommendation for short term rehab at [facility]. SNF workup process explained, no questions at this time.  Pt's age possible barrier to placement. SNF workup completed, bed offers pending.    TOC will continue to follow.   Expected Discharge Plan: Skilled Nursing Facility Barriers to Discharge: SNF Pending bed offer, Continued Medical Work up, English As Ebony Second Language Teacher   Patient Goals and CMS Choice            Expected Discharge Plan and Services       Living arrangements for the past 2 months: Apartment                                      Prior Living Arrangements/Services Living arrangements for the past 2 months: Apartment 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                   Activities of Daily Living      Permission Sought/Granted                  Emotional Assessment   Attitude/Demeanor/Rapport: Unable to Assess Affect (typically observed): Unable to Assess Orientation: : Oriented to Self, Oriented to Place, Oriented to  Time, Oriented to Situation Alcohol / Substance Use: Not Applicable Psych Involvement: No (comment)  Admission diagnosis:  AKI (acute kidney injury) (HCC) [N17.9] Patient Active Problem List   Diagnosis Date Noted   AKI (acute kidney injury) (HCC) 11/07/2023   Acute renal failure superimposed on stage 4 chronic kidney disease (HCC) 11/06/2023   History of CVA with left sided deficits 11/06/2023   Right sided weakness 11/06/2023   Transaminitis 11/06/2023   Metabolic acidosis 11/06/2023   Pelvic mass 11/06/2023   Pain due to onychomycosis of toenails of both feet 10/24/2021   Blood clotting  disorder (HCC) 10/24/2021   Loop recorder [employer] 12/14/2020 12/14/2020   Uncontrolled type 2 diabetes mellitus with hyperglycemia, without long-term current use of insulin  (HCC) 04/16/2020   CVA (cerebral vascular accident) (HCC) 04/15/2020   Cocaine use 04/15/2020   Acute CVA (cerebrovascular accident) (HCC) 03/04/2020   Stroke (cerebrum) (HCC) 03/03/2020   Weakness    Noncompliance with medication regimen    TIA (transient ischemic attack) 01/22/2019   Diabetes mellitus due to underlying condition without complications (HCC) 05/30/2014   Essential hypertension, benign 05/30/2014   Smoking 05/30/2014   PCP:  Maree Leni Edyth DELENA, MD Pharmacy:   [id number] [address] ([address]), [place] - [address] [place] ([place]) [place] [address] Phone: [phone] Fax: [phone]     Social Drivers of Health (SDOH) Social History: SDOH Screenings   Food Insecurity: No Food Insecurity (11/06/2023)  Housing: Unknown (11/06/2023)  Transportation Needs: No Transportation Needs (11/06/2023)  Utilities: Not At Risk (11/06/2023)  Depression (PHQ2-9): Low Risk  (07/17/2020)  Tobacco Use: Medium Risk (11/01/2023)   SDOH Interventions:     Readmission Risk Interventions     No data to display

## 2023-11-08 NOTE — Plan of Care (Signed)
  Problem: Coping: Goal: Ability to adjust to condition or change in health will improve Outcome: Progressing   Problem: Skin Integrity: Goal: Risk for impaired skin integrity will decrease Outcome: Progressing   Problem: Clinical Measurements: Goal: Ability to maintain clinical measurements within normal limits will improve Outcome: Progressing   Problem: Activity: Goal: Risk for activity intolerance will decrease Outcome: Progressing

## 2023-11-08 NOTE — Progress Notes (Signed)
 PROGRESS NOTE    Ebony Matthews  FMW:[id number] DOB: 14-May-1981 DOA: 11/06/2023 PCP: Maree Leni Edyth DELENA, MD   Chief Complaint  Patient presents with   Extremity Weakness    Brief Narrative:   Ebony Matthews is a 43 y.o. female with medical history significant of T2DM, hx of CVA with left sided deficits, HTN, cocaine use who presents to ED with complaints of right sided weakness x 2 days. Started in her RUE and then to her RLE. She also feels like her left weakness is worse. She states she came to ED due to weakness. She said she wasn't able to push herself up out of the bed. She states it slowly moved down to her right leg. She states it felt like dead weight. She states it made her fall. She fell two days ago and had to call EMS to help her off the floor. Her left sided weakness is at baseline. Poor historian.    She had a fall on 2/2. Seen at [facility]. Did not hit her head. She slipped on floor with her socks.    Denies any fever/chills, vision changes/headaches, chest pain or palpitations, shortness of breath or cough, abdominal pain, N/V/D, dysuria or leg swelling. She has not taken any NSAIDs. She feels like her PO intake is normal. Her urine does look darker to her. She has had no issues urinating or decreased urine output.      She does not smoke or drink alcohol. Remote hx of cocaine use, about 3-4 years ago.    ER Course:  vitals: afebrile, bp: 122/80, HR: 18, oxygen: 100%RA Pertinent labs: BUN: 35, creatinine: 3.99, AST: 381, ALT: 255, CO2:16,  CT head: Extensive hypodensity in the right cerebral hemisphere, which is new from the prior CT; some of this likely correlates with evolution of the acute infarcts noted on 04/15/2020, although the area involved is greater than would be expected. Within this area, there are foci of slightly increased density, which could represent hemorrhage or gliosis. An MRI of the brain is recommended for further evaluation.     Assessment & Plan:    Principal Problem:   Acute renal failure superimposed on stage 4 chronic kidney disease (HCC) Active Problems:   Pelvic mass   Right sided weakness   Transaminitis   Metabolic acidosis   Uncontrolled type 2 diabetes mellitus with hyperglycemia, without long-term current use of insulin  (HCC)   Essential hypertension, benign   History of CVA with left sided deficits   AKI (acute kidney injury) (HCC)  Acute renal failure superimposed on stage 4 chronic kidney disease (HCC) Rhabdomyolysis 43 year old presenting with 2 day history of right sided weakness found to have AKI on CKD (creatinine 8/24: 2.9). - Had onset of AKI in August thought due to new pelvic mass  -There is no evidence of hydronephrosis on imaging. -Urinalysis significant for proteinuria . -Continue with IV fluids  -Avoid nephrotoxic medications  -Total CK significant elevated at 23,000, hemoglobin urine dipstick is positive while RBC is negative, concern for rhabdomyolysis, continue with aggressive IV hydration trend total CK, trending down, it is at 15K today.   Pelvic mass CT renal study in 04/2023: Large complex cystic mass in the pelvis measures 17.9 cm. Favor adnexal origin, possibly representing a low-grade ovarian cystic neoplasm. -she denies any vaginal bleeding -Patient thinks last Pap smear was 2 to 3 years ago -Appreciate GYN consult, CA125 is pending.  Right sided weakness -MRI brain is negative for acute CVA  Transaminitis Concern for congestion from pelvic mass Liver US  wnl in 2/25 Tylenol  level within normal limit  negative hepatitis panel No acute finding in the liver and CT abdomen pelvis Hold statin Trending down   Metabolic acidosis Secondary to AKI, no AG  Gentle IVF Start on oral bicarb   Uncontrolled type 2 diabetes mellitus with hyperglycemia, and hypoglycemia, without long-term current use of insulin  (HCC) A1c 6.4 this this admission Hold metformin  with AKI/inpatient Hold jardiance   Sensitive SSI and accuchecks QAC/HS -UA significant glucosuria, this is likely due to Jardiance.   Essential hypertension, benign Continue toprol  for now    History of CVA with left sided deficits History of 2 CVA, last in 2021 with left sided hemiplegia.  She states her left weakness is at baseline. Unable to move her LUE at all and very minimal movement of LLE Hold statin with elevated LFTs  Resume aspirin  if no further workup anticipated    DVT prophylaxis: [place] heparin  Code Status: Full code Family Communication: discussed with aunt by phone Disposition:      Consultants:  GYN  Subjective:   She is complaining of left lower extremity spasm, generalized weakness and fatigue Objective: Vitals:   11/08/23 1117 11/08/23 1118 11/08/23 1119 11/08/23 1120  BP:    133/79  Pulse: 91 91 92 95  Resp: (!) 24 (!) 24 (!) 25 (!) 24  Temp:   98.9 F (37.2 C) 98.9 F (37.2 C)  TempSrc:    Oral  SpO2: 99% 99% 99% 99%  Weight:      Height:        Intake/Output Summary (Last 24 hours) at 11/08/2023 1425 Last data filed at 11/08/2023 0507 Gross per 24 hour  Intake 240 ml  Output 2450 ml  Net -2210 ml   Filed Weights   11/07/23 2024 11/08/23 0434  Weight: 92.4 kg 92.4 kg    Examination:  Awake Alert, Oriented X 3, dense left hemiparesis at baseline, she is with frontal alopecia, hirsutism with facial hair Symmetrical Chest wall movement, Good air movement bilaterally, CTAB RRR,No Gallops,Rubs or new Murmurs, No Parasternal Heave +ve B.Sounds, Abd Soft, No tenderness, No rebound - guarding or rigidity. No Cyanosis, Clubbing or edema, No new Rash or bruise      Data Reviewed: I have personally reviewed following labs and imaging studies  CBC: Recent Labs  Lab 11/01/23 1534 11/06/23 1332 11/06/23 1338 11/06/23 1558 11/07/23 0521 11/08/23 0629  WBC 6.5 6.0  --   --  7.3 6.8  NEUTROABS 3.9 3.8  --   --   --   --   HGB 11.7* 11.6* 12.2  --  9.9* 10.0*  HCT 37.2 37.5  36.0  --  31.4* 31.3*  MCV 85.1 86.2  --   --  84.9 83.0  PLT 251 PLATELET CLUMPS NOTED ON SMEAR, UNABLE TO ESTIMATE  --  259 264 289    Basic Metabolic Panel: Recent Labs  Lab 11/01/23 1534 11/06/23 1332 11/06/23 1338 11/07/23 0521 11/08/23 0629  NA 141 139 141 138 139  K 3.4* 4.2 4.2 3.8 3.8  CL 111 110 111 109 111  CO2 20* 16*  --  18* 18*  GLUCOSE 96 140* 139* 81 102*  BUN 22* 35* 40* 38* 41*  CREATININE 2.18* 3.99* 4.50* 4.24* 4.29*  CALCIUM  9.3 9.5  --  9.0 8.9    GFR: Estimated Creatinine Clearance: 19.5 mL/min (A) (by C-G formula based on SCr of 4.29 mg/dL (H)).  Liver  Function Tests: Recent Labs  Lab 11/01/23 1534 11/06/23 1332 11/08/23 0629  AST 224* 381* 236*  ALT 152* 255* 203*  ALKPHOS 55 59 47  BILITOT 0.7 0.7 0.6  PROT 7.2 6.6 5.7*  ALBUMIN 3.7 3.4* 2.7*    CBG: Recent Labs  Lab 11/07/23 1232 11/07/23 1734 11/07/23 2200 11/08/23 0852 11/08/23 1117  GLUCAP 121* 74 119* 96 131*     No results found for this or any previous visit (from the past 240 hours).       Radiology Studies: CT ABDOMEN PELVIS WO CONTRAST Result Date: 11/06/2023 CLINICAL DATA:  Adnexal mass, malignancy suspected EXAM: CT ABDOMEN AND PELVIS WITHOUT CONTRAST TECHNIQUE: Multidetector CT imaging of the abdomen and pelvis was performed following the standard protocol without IV contrast. RADIATION DOSE REDUCTION: This exam was performed according to the departmental dose-optimization program which includes automated exposure control, adjustment of the mA and/or kV according to patient size and/or use of iterative reconstruction technique. COMPARISON:  CT renal 05/19/2023 FINDINGS: Lower chest: No acute abnormality. Hepatobiliary: No focal liver abnormality. No gallstones, gallbladder wall thickening, or pericholecystic fluid. No biliary dilatation. Pancreas: No focal lesion. Normal pancreatic contour. No surrounding inflammatory changes. No main pancreatic ductal dilatation.  Spleen: Normal in size without focal abnormality. Adrenals/Urinary Tract: No adrenal nodule bilaterally. No nephrolithiasis and no hydronephrosis. No definite contour-deforming renal mass. No ureterolithiasis or hydroureter. The urinary bladder is unremarkable. Stomach/Bowel: Stomach is within normal limits. No evidence of bowel wall thickening or dilatation. Appendix appears normal. Vascular/Lymphatic: No abdominal aorta or iliac aneurysm. Moderate to severe atherosclerotic plaque of the aorta and its branches. No abdominal, pelvic, or inguinal lymphadenopathy. Reproductive: Uterus is unremarkable. Similar-appearing 18 x 12 x22 cm cystic pelvic lesion with associated calcification (3:71). This mass abuts the uterus as well as the sigmoid colon and several loops of small bowel. Other: No intraperitoneal free fluid. No intraperitoneal free gas. No organized fluid collection. Musculoskeletal: No abdominal wall hernia or abnormality. No suspicious lytic or blastic osseous lesions. No acute displaced fracture. Multilevel degenerative changes of the spine. IMPRESSION: 1. Similar-appearing complex 22 cm cystic pelvic lesion. Malignancy is not excluded. Recommend gynecologic consultation and MRI pelvis with and without contrast (including the mid abdomen to visualize the entire mass) for further evaluation. 2.  Aortic Atherosclerosis (ICD10-I70.0). 3. Limited evaluation on this noncontrast study. Electronically Signed   By: Morgane  Naveau M.D.   On: 11/06/2023 20:39   US  PELVIS (TRANSABDOMINAL ONLY) Result Date: 11/06/2023 CLINICAL DATA:  Pelvic mass EXAM: TRANSABDOMINAL ULTRASOUND OF PELVIS TECHNIQUE: Transabdominal ultrasound examination of the pelvis was performed including evaluation of the uterus, ovaries, adnexal regions, and pelvic cul-de-sac. COMPARISON:  CT 05/19/2023 FINDINGS: Uterus Measurements: 8.9 x 4.0 x 5.0 cm = volume: 93 mL. No fibroids or other mass visualized. Endometrium Thickness: 5 mm in  thickness.  No focal abnormality visualized. Right ovary Measurements: Right ovary tissue not definitively visualized. Large cystic mass in the right adnexae and extending into the abdomen measuring 18 x 20 x 14 cm. This is similar prior CT. Left ovary Measurements: 3.2 x 3.4 x 3.9 cm = volume: 22 mL. Normal appearance/no adnexal mass. Other findings:  No abnormal free fluid. IMPRESSION: 20 cm cystic mass noted in the pelvis and extending into the abdomen may arise from the right ovary. Normal right ovarian tissue not visualized. This is similar to prior CT and remains suspicious for low-grade ovarian neoplasm. Recommend gynecologic consultation. Electronically Signed   By: Franky Crease M.D.  On: 11/06/2023 19:20   US  RENAL Result Date: 11/06/2023 CLINICAL DATA:  AKI. EXAM: RENAL / URINARY TRACT ULTRASOUND COMPLETE COMPARISON:  05/19/2023. FINDINGS: Right Kidney: Renal measurements: 9.0 x 4.4 x 5.0 cm = volume: 103.94 mL. Increased parenchymal echogenicity. No mass or hydronephrosis visualized. Left Kidney: Renal measurements: 9.7 x 5.2 x 3.8 cm = volume: 99.58 mL. Increased parenchymal echogenicity. No mass or hydronephrosis visualized. Bladder: Appears normal for degree of bladder distention. Other: None. IMPRESSION: Increased renal echogenicity bilaterally suggesting medical renal disease. Electronically Signed   By: Leita Birmingham M.D.   On: 11/06/2023 19:15   MR LUMBAR SPINE WO CONTRAST Result Date: 11/06/2023 CLINICAL DATA:  Lumbar radiculopathy, cancer suspected. EXAM: MRI LUMBAR SPINE WITHOUT CONTRAST TECHNIQUE: Multiplanar, multisequence MR imaging of the lumbar spine was performed. No intravenous contrast was administered. COMPARISON:  CT renal stone study 05/19/2023 FINDINGS: Segmentation: 5 non rib-bearing lumbar type vertebral bodies are present. The lowest fully formed vertebral body is L5. Alignment: No significant listhesis is present. Lumbar lordosis is maintained. Vertebrae:  Marrow signal and  vertebral body heights are normal. Conus medullaris and cauda equina: Conus extends to the L2 level. Conus and cauda equina appear normal. Paraspinal and other soft tissues: A large cystic mass is again partially visualized. The paraspinous soft tissues are otherwise unremarkable. The bowel is displaced. Right ureter is dilated. Mild right-sided hydronephrosis is present. Disc levels: L1-2: Normal disc signal and height is present. No focal protrusion or stenosis is present. L2-3: A leftward disc protrusion is present. No focal stenosis is present. L3-4: Normal disc signal and height is present. No focal protrusion or stenosis is present. L4-5: A broad-based disc protrusion is present. Mild facet hypertrophy is noted bilaterally. Mild subarticular and foraminal narrowing is present bilaterally. L5-S1: Normal disc signal and height is present. No focal protrusion or stenosis is present. IMPRESSION: 1. Mild subarticular and foraminal narrowing bilaterally at L4-5 secondary to a broad-based disc protrusion and mild facet hypertrophy. 2. Leftward disc protrusion at L2-3 without stenosis. 3. Large cystic mass partially visualized in the pelvis. This remains concerning for an ovarian neoplasm. Air 4. Mild right-sided hydronephrosis and hydroureter. Electronically Signed   By: Lonni Necessary M.D.   On: 11/06/2023 18:37   MR BRAIN WO CONTRAST Result Date: 11/06/2023 CLINICAL DATA:  Neuro deficit, acute, stroke suspected. Right lower extremity weakness. EXAM: MRI HEAD WITHOUT CONTRAST TECHNIQUE: Multiplanar, multiecho pulse sequences of the brain and surrounding structures were obtained without intravenous contrast. COMPARISON:  CT head without contrast 11/06/2023. MR head without and with contrast 04/15/2020 FINDINGS: Brain: Chronic encephalomalacia is present in the right MCA territory with sparing of the right temporal lobe. Remote blood products are noted anteriorly and superiorly. No acute infarct or hemorrhage  is present. Ex vacuo dilation of the right lateral ventricle is noted. Ventricles are otherwise normal in size. Deep brain nuclei are within normal limits. Wall layering degeneration is present in right cerebral peduncle. White matter changes extend into the brainstem. The brainstem and cerebellum are otherwise within normal limits. The internal auditory canals are within normal limits. Vascular: Abnormal signal is present within the right internal carotid artery proximal to the ophthalmic segment. Flow is present in the left internal carotid artery and both vertebral arteries. Skull and upper cervical spine: The craniocervical junction is normal. Upper cervical spine is within normal limits. Marrow signal is unremarkable. Sinuses/Orbits: The paranasal sinuses and mastoid air cells are clear. The globes and orbits are within normal limits. IMPRESSION: 1. No  acute intracranial abnormality. 2. Chronic encephalomalacia in the right MCA territory with sparing of the right temporal lobe. 3. Abnormal signal within the right internal carotid artery proximal to the ophthalmic segment. This likely reflects chronic occlusion. 4. White matter changes as described likely reflect the sequela of chronic microvascular ischemia. Electronically Signed   By: Lonni Necessary M.D.   On: 11/06/2023 18:14   CT HEAD WO CONTRAST Result Date: 11/06/2023 CLINICAL DATA:  Right leg weakness, stroke suspected EXAM: CT HEAD WITHOUT CONTRAST TECHNIQUE: Contiguous axial images were obtained from the base of the skull through the vertex without intravenous contrast. RADIATION DOSE REDUCTION: This exam was performed according to the departmental dose-optimization program which includes automated exposure control, adjustment of the mA and/or kV according to patient size and/or use of iterative reconstruction technique. COMPARISON:  04/15/2020 CT head and MRI head FINDINGS: Brain: Extensive hypodensity in the right cerebral hemisphere, which is  new from the prior CT; some of this likely correlates with evolution of the acute infarcts noted on 04/15/2020, although the area involved is greater than would be expected. Within this area, there are foci of slightly increased density (series 2, images 24 and 25 close), which could represent hemorrhage or gliosis. Ex vacuo dilatation of the right lateral ventricle. Well layering degeneration of the right pons and midbrain. No mass, mass effect, or midline shift. No hydrocephalus or extra-axial collection. Vascular: No hyperdense vessel. Skull: Negative for fracture or focal lesion. Sinuses/Orbits: No acute finding. Other: The mastoid air cells are well aerated. IMPRESSION: Extensive hypodensity in the right cerebral hemisphere, which is new from the prior CT; some of this likely correlates with evolution of the acute infarcts noted on 04/15/2020, although the area involved is greater than would be expected. Within this area, there are foci of slightly increased density, which could represent hemorrhage or gliosis. An MRI of the brain is recommended for further evaluation. These results were called by telephone at the time of interpretation on 11/06/2023 at 3:09 pm to provider GOLD[place]ON, who verbally acknowledged these results. Electronically Signed   By: Donald Campion M.D.   On: 11/06/2023 15:09        Scheduled Meds:  heparin  injection (subcutaneous)  5,000 Units Subcutaneous Q8H   insulin  aspart  0-9 Units Subcutaneous TID WC   lidocaine   1 patch Transdermal Q24H   metoprolol  succinate  25 mg Oral Daily   Continuous Infusions:  sodium chloride  75 mL/hr at 11/08/23 0426     LOS: 1 day    Brayton Lye, MD [facility]   To contact the attending provider between 7A-7P or the covering provider during after hours 7P-7A, please log into the web site [email or web address] and access using universal [place] password for that web site. If you do not have the password, please call the hospital  operator.  11/08/2023, 2:25 PM

## 2023-11-08 NOTE — Progress Notes (Addendum)
 [facility] Progress Note  Subjective: UOP 1800 yesterday and 950 so far today.   Vitals:   11/08/23 1117 11/08/23 1118 11/08/23 1119 11/08/23 1120  BP:    133/79  Pulse: 91 91 92 95  Resp: (!) 24 (!) 24 (!) 25 (!) 24  Temp:   98.9 F (37.2 C) 98.9 F (37.2 C)  TempSrc:    Oral  SpO2: 99% 99% 99% 99%  Weight:      Height:        Exam: Gen alert, no distress No jvd or bruits Chest clear bilat to bases RRR no MRG Abd soft ntnd no mass or ascites +bs GU defer Ext no LE or UE edema, no other edema Neuro is alert, Ox 3 , LUE and LLE chronic weakness      Renal-related home meds: - toprol  xl 25 every day - others: empagliflozin, metformin , fenofibrate , statin, lantus  insulin , plavix / asa   Date                             Creat               eGFR (ml/min) 2008- 2019                  0.61- 0.90        > 60 ml/min 2020                            0.87- 1.03        > 60 ml/min 2021                            0.70- 0.89        > 60 ml/min 05/19/23                        2.93                 20 ml/min         11/01/23                        2.18                 28 ml/min 11/06/23                        3.99, 4.50        14 11/07/23                        4.24                 13     Assessment/ Plan: AKI on CKD 4 - b/l creat 2.1- 2.9 from aug 2024 to feb 2025, eGFR 20-28 ml/min. Does not have a nephrologist. Creat here is 3.99 on admit, went up to 4.5 and was 4.2 at time of consult . CPK was 23K and R upper leg was painful suggesting crush injury to the R leg causing acute rhabdomyolysis. In epic labs there is a 3 year gap, from 2021 to 2024, w/ no renal labs, hopefully we can fill this in w/ labs from her PCP's office at [facility] next week. UA shows small amt protein, no sig rbc/wbc's, large Hb (which  is likely myoglobin). CT showed normal appearing kidneys w/o obstruction. There is a purewick in place. Suspect AKI is ATN from rhabdomyolysis. CKD 4 is most likely due  to long-term uncont HTN/ DM2. Gave a bolus 2.5L and cont'd IVF's at 75 cc/hr. Today CPK is coming down. Creat is stuck in the low 4's. UOP is good. No vol excess, will cont IVFs at 75/ hr. Will follow.  Acute rhabdomyolysis - crush injury to R leg most likely. Supportive care. CPK coming down.  DM2 - on insulin  HTN - cont metoprolol  xl, BP's stable H/o L hemiparesis - from prior CVA's        Myer Fret MD  [facility] 11/08/2023, 11:31 AM  Recent Labs  Lab 11/06/23 1332 11/06/23 1338 11/07/23 0521 11/08/23 0629  HGB 11.6*   < > 9.9* 10.0*  ALBUMIN 3.4*  --   --  2.7*  CALCIUM  9.5  --  9.0 8.9  CREATININE 3.99*   < > 4.24* 4.29*  K 4.2   < > 3.8 3.8   < > = values in this interval not displayed.   No results for input(s): IRON, TIBC, FERRITIN in the last 168 hours. Inpatient medications:  heparin  injection (subcutaneous)  5,000 Units Subcutaneous Q8H   insulin  aspart  0-9 Units Subcutaneous TID WC   lidocaine   1 patch Transdermal Q24H   metoprolol  succinate  25 mg Oral Daily    sodium chloride  75 mL/hr at 11/08/23 0426   cyclobenzaprine , ondansetron  **OR** ondansetron  (ZOFRAN ) IV

## 2023-11-08 NOTE — [place] FL2 (Signed)
 [place]  MEDICAID FL2 LEVEL OF CARE FORM     IDENTIFICATION  Patient Name: Ebony Matthews Birthdate: 08/23/1981 Sex: female Admission Date (Current Location): 11/06/2023  [place] and [place] Number:  [place] [id number] N Facility and Address:  [facility], [address] [place], [place] [address]      Provider Number: [id number]  Attending Physician Name and Address:  Elgergawy, Brayton RAMAN, MD  Relative Name and Phone Number:  Letetia, Romanello (Aunt)  [phone]    Current Level of Care: SNF Recommended Level of Care: Skilled Nursing Facility Prior Approval Number:    Date Approved/Denied:   PASRR Number: [id number]  Discharge Plan: SNF    Current Diagnoses: Patient Active Problem List   Diagnosis Date Noted   AKI (acute kidney injury) (HCC) 11/07/2023   Acute renal failure superimposed on stage 4 chronic kidney disease (HCC) 11/06/2023   History of CVA with left sided deficits 11/06/2023   Right sided weakness 11/06/2023   Transaminitis 11/06/2023   Metabolic acidosis 11/06/2023   Pelvic mass 11/06/2023   Pain due to onychomycosis of toenails of both feet 10/24/2021   Blood clotting disorder (HCC) 10/24/2021   Loop recorder [employer] 12/14/2020 12/14/2020   Uncontrolled type 2 diabetes mellitus with hyperglycemia, without long-term current use of insulin  (HCC) 04/16/2020   CVA (cerebral vascular accident) (HCC) 04/15/2020   Cocaine use 04/15/2020   Acute CVA (cerebrovascular accident) (HCC) 03/04/2020   Stroke (cerebrum) (HCC) 03/03/2020   Weakness    Noncompliance with medication regimen    TIA (transient ischemic attack) 01/22/2019   Diabetes mellitus due to underlying condition without complications (HCC) 05/30/2014   Essential hypertension, benign 05/30/2014   Smoking 05/30/2014    Orientation RESPIRATION BLADDER Height & Weight     Self, Time, Situation, Place  Normal Incontinent, External catheter Weight: 203 lb 11.3 oz  (92.4 kg) Height:  5' 7 (170.2 cm)  BEHAVIORAL SYMPTOMS/MOOD NEUROLOGICAL BOWEL NUTRITION STATUS      Continent Diet (see DC summary)  AMBULATORY STATUS COMMUNICATION OF NEEDS Skin     Verbally Normal                       Personal Care Assistance Level of Assistance  Bathing, Dressing, Feeding Bathing Assistance: Maximum assistance Feeding assistance: Limited assistance Dressing Assistance: Maximum assistance     Functional Limitations Info  Sight, Hearing, Speech Sight Info: Impaired (wears glasses) Hearing Info: Adequate Speech Info: Adequate    SPECIAL CARE FACTORS FREQUENCY  PT (By licensed PT), OT (By licensed OT)     PT Frequency: 5x/week OT Frequency: 5x/week            Contractures Contractures Info: Not present    Additional Factors Info  Code Status, Allergies Code Status Info: FULL Allergies Info: No Known Allergies           Current Medications (11/08/2023):  This is the current hospital active medication list Current Facility-Administered Medications  Medication Dose Route Frequency Provider Last Rate Last Admin   0.9 %  sodium chloride  infusion   Intravenous Continuous Geralynn Charleston, MD 75 mL/hr at 11/08/23 0426 New Bag at 11/08/23 0426   cyclobenzaprine  (FLEXERIL ) tablet 5 mg  5 mg Oral TID PRN Elgergawy, Dawood S, MD   5 mg at 11/07/23 1043   heparin  injection 5,000 Units  5,000 Units Subcutaneous Q8H Elgergawy, Dawood S, MD   5,000 Units at 11/08/23 1223   insulin  aspart (novoLOG ) injection 0-9 Units  0-9 Units  Subcutaneous TID WC Waddell Rake, MD   1 Units at 11/08/23 1223   lidocaine  (LIDODERM ) 5 % 1 patch  1 patch Transdermal Q24H Opyd, Timothy S, MD   1 patch at 11/08/23 0500   metoprolol  succinate (TOPROL -XL) 24 hr tablet 25 mg  25 mg Oral Daily Waddell Rake, MD   25 mg at 11/08/23 9141   ondansetron  (ZOFRAN ) tablet 4 mg  4 mg Oral Q6H PRN Waddell Rake, MD       Or   ondansetron  (ZOFRAN ) injection 4 mg  4 mg Intravenous Q6H PRN  Waddell Rake, MD         Discharge Medications: Please see discharge summary for a list of discharge medications.  Relevant Imaging Results:  Relevant Lab Results:   Additional Information [id number]  Sadia Belfiore A Carel Schnee, LCSW

## 2023-11-08 NOTE — Plan of Care (Signed)

## 2023-11-09 ENCOUNTER — Inpatient Hospital Stay (HOSPITAL_COMMUNITY): Payer: 59

## 2023-11-09 DIAGNOSIS — R1900 Intra-abdominal and pelvic swelling, mass and lump, unspecified site: Secondary | ICD-10-CM | POA: Diagnosis not present

## 2023-11-09 DIAGNOSIS — N184 Chronic kidney disease, stage 4 (severe): Secondary | ICD-10-CM | POA: Diagnosis not present

## 2023-11-09 DIAGNOSIS — N179 Acute kidney failure, unspecified: Secondary | ICD-10-CM | POA: Diagnosis not present

## 2023-11-09 LAB — COMPREHENSIVE METABOLIC PANEL
ALT: 211 U/L — ABNORMAL HIGH (ref 0–44)
AST: 221 U/L — ABNORMAL HIGH (ref 15–41)
Albumin: 2.7 g/dL — ABNORMAL LOW (ref 3.5–5.0)
Alkaline Phosphatase: 49 U/L (ref 38–126)
Anion gap: 18 — ABNORMAL HIGH (ref 5–15)
BUN: 42 mg/dL — ABNORMAL HIGH (ref 6–20)
CO2: 18 mmol/L — ABNORMAL LOW (ref 22–32)
Calcium: 9.2 mg/dL (ref 8.9–10.3)
Chloride: 106 mmol/L (ref 98–111)
Creatinine, Ser: 4.36 mg/dL — ABNORMAL HIGH (ref 0.44–1.00)
GFR, Estimated: 12 mL/min — ABNORMAL LOW (ref >60)
Glucose, Bld: 124 mg/dL — ABNORMAL HIGH (ref 70–99)
Potassium: 3.7 mmol/L (ref 3.5–5.1)
Sodium: 142 mmol/L (ref 135–145)
Total Bilirubin: 0.4 mg/dL (ref 0.0–1.2)
Total Protein: 5.7 g/dL — ABNORMAL LOW (ref 6.5–8.1)

## 2023-11-09 LAB — GLUCOSE, CAPILLARY
Glucose-Capillary: 109 mg/dL — ABNORMAL HIGH (ref 70–99)
Glucose-Capillary: 143 mg/dL — ABNORMAL HIGH (ref 70–99)
Glucose-Capillary: 135 mg/dL — ABNORMAL HIGH (ref 70–99)
Glucose-Capillary: 155 mg/dL — ABNORMAL HIGH (ref 70–99)

## 2023-11-09 LAB — CBC
HCT: 32.4 % — ABNORMAL LOW (ref 36.0–46.0)
Hemoglobin: 10.3 g/dL — ABNORMAL LOW (ref 12.0–15.0)
MCH: 26.8 pg (ref 26.0–34.0)
MCHC: 31.8 g/dL (ref 30.0–36.0)
MCV: 84.2 fL (ref 80.0–100.0)
Platelets: 269 10*3/uL (ref 150–400)
RBC: 3.85 MIL/uL — ABNORMAL LOW (ref 3.87–5.11)
RDW: 17.6 % — ABNORMAL HIGH (ref 11.5–15.5)
WBC: 7 10*3/uL (ref 4.0–10.5)
nRBC: 0 % (ref 0.0–0.2)

## 2023-11-09 LAB — FOLATE: Folate: 6.7 ng/mL (ref >5.9)

## 2023-11-09 LAB — CA 125: Cancer Antigen (CA) 125: 2.7 U/mL (ref 0.0–38.1)

## 2023-11-09 LAB — CK: Total CK: 14591 U/L — ABNORMAL HIGH (ref 38–234)

## 2023-11-09 MED ORDER — DICLOFENAC SODIUM 1 % EX GEL
2.0000 g | Freq: Three times a day (TID) | CUTANEOUS | Status: DC
  Administered 2023-11-09 – 2023-11-14 (×23): 2 g via TOPICAL
  Filled 2023-11-09: qty 100

## 2023-11-09 MED ORDER — FOLIC ACID 1 MG PO TABS
1.0000 mg | ORAL_TABLET | Freq: Every day | ORAL | Status: DC
  Administered 2023-11-09 – 2023-11-14 (×6): 1 mg via ORAL
  Filled 2023-11-09 (×6): qty 1

## 2023-11-09 MED ORDER — LACTATED RINGERS IV SOLN: INTRAVENOUS | Status: AC

## 2023-11-09 MED ORDER — CYANOCOBALAMIN 1000 MCG/ML IJ SOLN
1000.0000 ug | Freq: Every day | INTRAMUSCULAR | Status: DC
  Administered 2023-11-09 – 2023-11-14 (×6): 1000 ug via SUBCUTANEOUS
  Filled 2023-11-09 (×6): qty 1

## 2023-11-09 MED ORDER — SENNA 8.6 MG PO TABS
1.0000 | ORAL_TABLET | Freq: Every day | ORAL | Status: DC | PRN
  Administered 2023-11-09: 8.6 mg via ORAL
  Filled 2023-11-09: qty 1

## 2023-11-09 MED ORDER — CYANOCOBALAMIN 1000 MCG/ML IJ SOLN: 1000.0000 ug | INTRAMUSCULAR | Status: DC

## 2023-11-09 NOTE — Plan of Care (Signed)

## 2023-11-09 NOTE — Progress Notes (Signed)
 Patient ID: Ebony Matthews, female   DOB: [birth year], 43 y.o.   MRN: [id number] [facility] Progress Note   Assessment/ Plan:   1. Acute kidney Injury on chronic kidney disease stage IV: Baseline creatinine presumed to be 2.1-2.9 based on labs from August 2024.  Presentation with acute kidney injury that appears to be from a combination of prerenal azotemia +/- rhabdomyolysis from recent fall/thigh injury with elevated CPK level.  She continues to have excellent urine output with renal function currently plateaued at around 4.3 and without any acute electrolyte abnormality or uremic signs/symptoms to prompt need to initiate dialysis.  Will continue supportive management with encouragement of oral intake and an additional 2 L of intravenous fluids ordered today with elevated CPK. 2.  Rhabdomyolysis: Suspected to be due to trauma sustained during fall of right leg/thigh.  CPK level unchanged overnight after initial appearance of trending down. 3.  Hypertension: Blood pressures currently controlled on monotherapy with metoprolol , monitor with volume expansion. 4.  Anemia: Hemoglobin and hematocrit slightly worse following volume expansion, will continue to monitor trend and for overt losses.  Subjective:   Denies any acute events overnight and continues to have intermittent pain in her hips and thighs.   Objective:   BP 119/64 (BP Location: Right Arm)   Pulse 88   Temp 98.2 F (36.8 C) (Oral)   Resp 14   Ht 5\' 7"  (1.702 m)   Wt 95 kg   LMP 10/03/2023 (Exact Date)   SpO2 99%   BMI 32.80 kg/m   Intake/Output Summary (Last 24 hours) at 11/09/2023 1035 Last data filed at 11/09/2023 0539 Gross per 24 hour  Intake --  Output 2650 ml  Net -2650 ml   Weight change: 2.6 kg  Physical Exam: Gen: Young woman with significant hirsutism resting comfortably in bed. CVS: Pulse regular rhythm, normal rate, S1 and S2 normal Resp: Clear to auscultation bilaterally, no rales/rhonchi Abd:  Soft, flat, nontender, bowel sounds normal Ext: No lower extremity edema.   Imaging: No results found.  Labs: BMET Recent Labs  Lab 11/06/23 1332 11/06/23 1338 11/07/23 0521 11/08/23 0629 11/09/23 0427  NA 139 141 138 139 142  K 4.2 4.2 3.8 3.8 3.7  CL 110 111 109 111 106  CO2 16*  --  18* 18* 18*  GLUCOSE 140* 139* 81 102* 124*  BUN 35* 40* 38* 41* 42*  CREATININE 3.99* 4.50* 4.24* 4.29* 4.36*  CALCIUM  9.5  --  9.0 8.9 9.2   CBC Recent Labs  Lab 11/06/23 1332 11/06/23 1338 11/06/23 1558 11/07/23 0521 11/08/23 0629 11/09/23 0427  WBC 6.0  --   --  7.3 6.8 7.0  NEUTROABS 3.8  --   --   --   --   --   HGB 11.6* 12.2  --  9.9* 10.0* 10.3*  HCT 37.5 36.0  --  31.4* 31.3* 32.4*  MCV 86.2  --   --  84.9 83.0 84.2  PLT PLATELET CLUMPS NOTED ON SMEAR, UNABLE TO ESTIMATE  --  259 264 289 269    Medications:     cyanocobalamin   1,000 mcg Subcutaneous Daily   Followed by   Cecily Cohen ON 11/16/2023] cyanocobalamin   1,000 mcg Subcutaneous Q30 days   diclofenac  Sodium  2 g Topical TID AC & HS   heparin  injection (subcutaneous)  5,000 Units Subcutaneous Q8H   insulin  aspart  0-9 Units Subcutaneous TID WC   lidocaine   1 patch Transdermal Q24H   metoprolol  succinate  25  mg Oral Daily    Clevester Dally, MD 11/09/2023, 10:35 AM

## 2023-11-09 NOTE — Progress Notes (Addendum)
 PROGRESS NOTE    Ebony Matthews  WUJ:[id number] DOB: [birth year] DOA: 11/06/2023 PCP: Ulysees Gander, MD   Chief Complaint  Patient presents with   Extremity Weakness    Brief Narrative:   Ebony Matthews is a 43 y.o. female with medical history significant of T2DM, hx of CVA with left sided deficits, HTN, cocaine use in the past, who presents to ED with complaints of right sided weakness x 2 days.  Patient with baseline dense left hemiparesis, patient reports she slid from her bed to the floor, she reports was on the floor for couple hours and her on called ambulance, workup in ED significant for elevated creatinine at 4 range, most recent was 2.1 just last week, workup significant for rhabdomyolysis, as well with pelvic mass.     Assessment & Plan:   Principal Problem:   Acute renal failure superimposed on stage 4 chronic kidney disease (HCC) Active Problems:   Pelvic mass   Right sided weakness   Transaminitis   Metabolic acidosis   Uncontrolled type 2 diabetes mellitus with hyperglycemia, without long-term current use of insulin  (HCC)   Essential hypertension, benign   History of CVA with left sided deficits   AKI (acute kidney injury) (HCC)  Acute renal failure superimposed on stage 4 chronic kidney disease (HCC) Rhabdomyolysis 43 year old presenting with 2 day history of right sided weakness found to have AKI on CKD (creatinine 8/24: 2.9).  Creatinine in 11/01/2023 was 2.1. - Had onset of AKI in August thought due to new pelvic mass  -There is no evidence of hydronephrosis on imaging. -Urinalysis significant for proteinuria . -Continue with IV fluids  -Avoid nephrotoxic medications  -Total CK significant elevated at 23,000, hemoglobin urine dipstick is positive while RBC is negative, concern for rhabdomyolysis, continue with aggressive IV hydration trend total CK, trending down, but remains significantly elevated, continue with IV fluids at 100 cc/h -Renal input  greatly appreciated -Avoid hypotension, will DC Toprol  XL   Pelvic mass CT renal study in 04/2023: Large complex cystic mass in the pelvis measures 17.9 cm. Favor adnexal origin, possibly representing a low-grade ovarian cystic neoplasm. -she denies any vaginal bleeding -Patient thinks last Pap smear was 2 to 3 years ago -Appreciate GYN consult, CA125 is pending.  MRI pelvis without contrast has obtained today, will await further GYN recommendation  Right sided weakness -MRI brain is negative for acute CVA   Transaminitis Concern for congestion from pelvic mass Liver US  wnl in 2/25 Tylenol  level within normal limit  negative hepatitis panel No acute finding in the liver and CT abdomen pelvis Hold statin Trending down   Metabolic acidosis Secondary to AKI, no AG  Gentle IVF Start on oral bicarb  Normocytic anemia B12 deficiency -Anemia most likely in the setting of chronic kidney disease, hemoglobin is stable -B12 level significantly low at 111, will start on IM supplements  Uncontrolled type 2 diabetes mellitus with hyperglycemia, and hypoglycemia, without long-term current use of insulin  (HCC) A1c 6.4 this this admission Hold metformin  with AKI/inpatient Hold jardiance  Sensitive SSI and accuchecks QAC/HS -UA significant glucosuria, this is likely due to Jardiance.   Essential hypertension, benign Will DC home Toprol  to avoid hypotension due to AKI   History of CVA with left sided deficits History of 2 CVA, last in 2021 with left sided hemiplegia.  She states her left weakness is at baseline. Unable to move her LUE at all and very minimal movement of LLE Hold statin with elevated  LFTs  Resume aspirin  if no further workup anticipated    DVT prophylaxis: [place] heparin  Code Status: Full code Family Communication: None at bedside today Disposition:      Consultants:  GYN  Subjective:  No significant events overnight as discussed with staff, she still reporting  intermittent pain in her hip and thigh area and right lower extremity with ambulation Objective: Vitals:   11/09/23 0311 11/09/23 0500 11/09/23 0908 11/09/23 1309  BP: 119/64   107/72  Pulse: 88   92  Resp: 14   20  Temp: 98.6 F (37 C)  98.2 F (36.8 C) 98.8 F (37.1 C)  TempSrc: Oral  Oral Oral  SpO2: 99%  99%   Weight:  95 kg    Height:        Intake/Output Summary (Last 24 hours) at 11/09/2023 1317 Last data filed at 11/09/2023 1311 Gross per 24 hour  Intake --  Output 3450 ml  Net -3450 ml   Filed Weights   11/07/23 2024 11/08/23 0434 11/09/23 0500  Weight: 92.4 kg 92.4 kg 95 kg    Examination:  Awake Alert, Oriented X 3, dense left hemiparesis at baseline, she is with frontal alopecia, hirsutism with facial hair Symmetrical Chest wall movement, Good air movement bilaterally, CTAB RRR,No Gallops,Rubs or new Murmurs, No Parasternal Heave +ve B.Sounds, Abd Soft, No tenderness, No rebound - guarding or rigidity. No Cyanosis, Clubbing or edema, No new Rash or bruise      Data Reviewed: I have personally reviewed following labs and imaging studies  CBC: Recent Labs  Lab 11/06/23 1332 11/06/23 1338 11/06/23 1558 11/07/23 0521 11/08/23 0629 11/09/23 0427  WBC 6.0  --   --  7.3 6.8 7.0  NEUTROABS 3.8  --   --   --   --   --   HGB 11.6* 12.2  --  9.9* 10.0* 10.3*  HCT 37.5 36.0  --  31.4* 31.3* 32.4*  MCV 86.2  --   --  84.9 83.0 84.2  PLT PLATELET CLUMPS NOTED ON SMEAR, UNABLE TO ESTIMATE  --  259 264 289 269    Basic Metabolic Panel: Recent Labs  Lab 11/06/23 1332 11/06/23 1338 11/07/23 0521 11/08/23 0629 11/09/23 0427  NA 139 141 138 139 142  K 4.2 4.2 3.8 3.8 3.7  CL 110 111 109 111 106  CO2 16*  --  18* 18* 18*  GLUCOSE 140* 139* 81 102* 124*  BUN 35* 40* 38* 41* 42*  CREATININE 3.99* 4.50* 4.24* 4.29* 4.36*  CALCIUM  9.5  --  9.0 8.9 9.2    GFR: Estimated Creatinine Clearance: 19.5 mL/min (A) (by C-G formula based on SCr of 4.36 mg/dL  (H)).  Liver Function Tests: Recent Labs  Lab 11/06/23 1332 11/08/23 0629 11/09/23 0427  AST 381* 236* 221*  ALT 255* 203* 211*  ALKPHOS 59 47 49  BILITOT 0.7 0.6 0.4  PROT 6.6 5.7* 5.7*  ALBUMIN 3.4* 2.7* 2.7*    CBG: Recent Labs  Lab 11/08/23 1117 11/08/23 1709 11/08/23 2154 11/09/23 0907 11/09/23 1308  GLUCAP 131* 120* 203* 109* 143*     No results found for this or any previous visit (from the past 240 hours).       Radiology Studies: MR PELVIS WO CONTRAST Result Date: 11/09/2023 CLINICAL DATA:  Adnexal mass EXAM: MRI PELVIS WITHOUT CONTRAST TECHNIQUE: Multiplanar multisequence MR imaging of the pelvis was performed. No intravenous contrast was administered. COMPARISON:  CT scan 11/06/2023 and older going back to  May 19, 2023. Remote ultrasound of the pelvis report from 2011 December FINDINGS: Urinary Tract: Preserved contour to the urinary bladder. Grossly normal course and caliber to the urethra. Bowel: Visualized bowel in the pelvis is nondilated. This includes small and large bowel. Vascular/Lymphatic: Normal caliber aorta and iliac vessels. There is some plaque seen along the left common femoral artery at the edge of the imaging field. No discrete abnormal lymph node enlargement identified in the pelvis. There are several small less than 1 cm in size iliac chain nodes, not pathologic by size criteria. Reproductive: Uterus measures 7.7 x 4.5 by 4.9 cm. Endometrial stripe of 9 mm. Junctional zone close to 8 mm. No cystic areas along the junctional zone. No myometrial lesion. Left ovary has follicles measuring 2.4 x 3.9 cm by 2.8 cm. Largest dominant follicle or small cyst measures 19 x 14 by 18 mm. The right ovary is not seen as a discrete separate structure. There is a very large cystic mass identified in the anterior central pelvis. This complex lesion has dimensions today approaching 18.0 by 10.6 by 22.3 cm. On the CT scan of August 2024 lesion would have measured  20.3 x 17.9 x 11.2 cm. There are some nodular areas along the anterior aspect of this lesion. This was seen before with component of calcification. Example nodular area that is noncalcified measures 4.5 by 1.8 cm in the axial plane on series 6, image 25. This is not as well characterized without the advantage of IV contrast but this area of nodularity in the lesion does show some restricted diffusion. There is some additional small frondlike areas identified elsewhere along the anterior aspect of the cystic lesion including along left side on series 6, image 30. Due to its size is difficult to ascertain the exact origin of this mass but this very well could be an ovarian lesion and a malignant process is possible. Other:  Small amount of free fluid in the pelvis. Musculoskeletal: No suspicious bone lesions identified.  Anasarca. Evaluation limited by motion. IMPRESSION: Very large cystic lesion identified in the central pelvis with some nodular potential soft tissue elements. Small amount of free fluid the pelvis. Based on the overall appearance this very well could be an ovarian lesion and a cystic neoplasm of the ovary is possible including an aggressive or malignant process. The right ovary is not seen as a separate structure. Recommend surgical consultation. In principle with a size this lesion is difficult to ascertain the exact organ of origin as this does abut several structures. In addition there is some potential soft tissue elements, and if needed additional evaluation with a postcontrast set of images can be obtained. Motion Electronically Signed   By: Adrianna Horde M.D.   On: 11/09/2023 12:57        Scheduled Meds:  cyanocobalamin   1,000 mcg Subcutaneous Daily   Followed by   Cecily Cohen ON 11/16/2023] cyanocobalamin   1,000 mcg Subcutaneous Q30 days   diclofenac  Sodium  2 g Topical TID AC & HS   heparin  injection (subcutaneous)  5,000 Units Subcutaneous Q8H   insulin  aspart  0-9 Units Subcutaneous  TID WC   lidocaine   1 patch Transdermal Q24H   metoprolol  succinate  25 mg Oral Daily   Continuous Infusions:  lactated ringers        LOS: 2 days    Seena Dadds, MD [facility]   To contact the attending provider between 7A-7P or the covering provider during after hours 7P-7A, please log into the  web site [email or web address] and access using universal [place] password for that web site. If you do not have the password, please call the hospital operator.  11/09/2023, 1:17 PM

## 2023-11-09 NOTE — TOC Progression Note (Signed)
 Transition of Care [facility]) - Progression Note    Patient Details  Name: Ebony Matthews MRN: [id number] Date of Birth: Jan 29, 1981  [facility]) CM/SW Contact  Jannice Mends, LCSW Phone Number: 11/09/2023, 3:08 PM  Clinical Narrative:    CSW met with patient (and her aunt on the phone as requested by patient) and provided SNF bed offers and ratings. They will review and let CSW know choice tomorrow.    Skilled Nursing Rehab Facilities-   [email or web address] Ratings out of 5 stars (the highest)   Name Address  Phone # Quality Care Staffing Health Inspection Overall  [facility] [address] [phone] 2 2 5 5   [facility] [address], [place] [phone] 4 2 4 4   [facility] [address], [facility] [phone] 2 1 2 1   [facility] [address], [place] [phone] 3 1 4 3   [facility]  [address], [place] [phone] 4 4 5 5   [facility] [address], [facility] [phone] 3 2 2 2   [facility] [address], [place] [phone] 5 1 2 2   [facility] [address], [place] [phone] 1 1 3 1   [address] ([place]) [address] [place] [phone] 2 2 2 2   [facility] [address] [place], [place] [phone] 2 2 1 1   [facility] [address], [place] [phone] 3 1 1 1   [facility] [address] [place] [phone] 3 3 4 4   [facility] [address] [place] [phone] 3 4 3 3           [facility] [address] [place] [phone] 4 2 1 1   [facility] [address] [place] [phone] 2 2 2 2   [facility] [address], [place] [phone] 2 1 4 3   [address] [place] [address], [place] [phone] 1 1 2 1   [facility] [address], [employer] [phone] 2 1 4 3            [address] (no [facility]) [address], [place] [phone] 4 4 5 5   [facility] (No [employer]) [address] [place] [phone] 1 2 4 3   [facility] (No [employer]) [address], [place] [phone] 4 1 5 4   [facility] [address], [employer] [phone] 3 4 2 2   [facility] [address] [place] [phone] 2 1 2 1   [facility] [address] [place] [phone] 2 1 1 1   [facility] [address] [place] [phone] 4 2 5 5   [facility]  [address] [place] [phone] 2 2 3 3   [facility] [address] [place] [phone] 4 1 1 1   [facility] [address] [place], [place] [phone] 2 2 2 2           [facility] [address] [place] [phone] 2 1 1 1   [facility] [address] [place]  [phone] 3 3 3 3   [facility] [address], [place] [phone] 4 3 5 5   [facility] [place] [address] [place] [phone] 2 1 1 1   [facility] (No [employer]) [address], [place] [phone] 2 2 4 4   [facility] [address], [place] [phone] 2 1 2 1           [facility] [address] [place], [place] [phone] 5 4  5 5  [facility])  [address], [place] [phone] 1 1 2 1   [facility]) [address], [place] [phone]  2 4 4   [facility] [place] [address] [place] [phone] 3 5 5 5   [address] [place], [place] [phone] 4 2 2 2   [facility]) [address] [place] [phone] 2 1 3 2       Expected Discharge Plan: Skilled Nursing Facility Barriers to Discharge: SNF Pending bed offer, Continued Medical Work up, English as a second language teacher  Expected Discharge Plan and Services       Living arrangements for the past 2 months: Apartment                                       Social Determinants of  Health (SDOH) Interventions SDOH Screenings   Food Insecurity: No Food Insecurity (11/06/2023)  Housing: Unknown (11/06/2023)  Transportation Needs: No Transportation Needs (11/06/2023)  Utilities: Not At Risk (11/06/2023)  Depression (PHQ2-9): Low Risk  (07/17/2020)  Tobacco Use: Medium Risk (11/01/2023)    Readmission Risk Interventions     No data to display

## 2023-11-09 NOTE — Plan of Care (Signed)
  Problem: Skin Integrity: Goal: Risk for impaired skin integrity will decrease Outcome: Progressing   Problem: Clinical Measurements: Goal: Ability to maintain clinical measurements within normal limits will improve Outcome: Progressing   Problem: Clinical Measurements: Goal: Will remain free from infection Outcome: Progressing   Problem: Activity: Goal: Risk for activity intolerance will decrease Outcome: Progressing   Problem: Pain Managment: Goal: General experience of comfort will improve and/or be controlled Outcome: Progressing   Problem: Safety: Goal: Ability to remain free from injury will improve Outcome: Progressing   Problem: Skin Integrity: Goal: Risk for impaired skin integrity will decrease Outcome: Progressing

## 2023-11-10 DIAGNOSIS — R1900 Intra-abdominal and pelvic swelling, mass and lump, unspecified site: Secondary | ICD-10-CM | POA: Diagnosis not present

## 2023-11-10 DIAGNOSIS — N179 Acute kidney failure, unspecified: Secondary | ICD-10-CM | POA: Diagnosis not present

## 2023-11-10 DIAGNOSIS — N184 Chronic kidney disease, stage 4 (severe): Secondary | ICD-10-CM | POA: Diagnosis not present

## 2023-11-10 LAB — COMPREHENSIVE METABOLIC PANEL
ALT: 219 U/L — ABNORMAL HIGH (ref 0–44)
AST: 227 U/L — ABNORMAL HIGH (ref 15–41)
Albumin: 2.7 g/dL — ABNORMAL LOW (ref 3.5–5.0)
Alkaline Phosphatase: 56 U/L (ref 38–126)
Anion gap: 11 (ref 5–15)
BUN: 44 mg/dL — ABNORMAL HIGH (ref 6–20)
CO2: 18 mmol/L — ABNORMAL LOW (ref 22–32)
Calcium: 8.8 mg/dL — ABNORMAL LOW (ref 8.9–10.3)
Chloride: 108 mmol/L (ref 98–111)
Creatinine, Ser: 4.27 mg/dL — ABNORMAL HIGH (ref 0.44–1.00)
GFR, Estimated: 12 mL/min — ABNORMAL LOW (ref >60)
Glucose, Bld: 131 mg/dL — ABNORMAL HIGH (ref 70–99)
Potassium: 4.1 mmol/L (ref 3.5–5.1)
Sodium: 137 mmol/L (ref 135–145)
Total Bilirubin: 0.5 mg/dL (ref 0.0–1.2)
Total Protein: 5.7 g/dL — ABNORMAL LOW (ref 6.5–8.1)

## 2023-11-10 LAB — CBC
HCT: 34.3 % — ABNORMAL LOW (ref 36.0–46.0)
Hemoglobin: 10.9 g/dL — ABNORMAL LOW (ref 12.0–15.0)
MCH: 26.7 pg (ref 26.0–34.0)
MCHC: 31.8 g/dL (ref 30.0–36.0)
MCV: 84.1 fL (ref 80.0–100.0)
Platelets: 277 10*3/uL (ref 150–400)
RBC: 4.08 MIL/uL (ref 3.87–5.11)
RDW: 17.8 % — ABNORMAL HIGH (ref 11.5–15.5)
WBC: 7.5 10*3/uL (ref 4.0–10.5)
nRBC: 0 % (ref 0.0–0.2)

## 2023-11-10 LAB — GLUCOSE, CAPILLARY
Glucose-Capillary: 153 mg/dL — ABNORMAL HIGH (ref 70–99)
Glucose-Capillary: 162 mg/dL — ABNORMAL HIGH (ref 70–99)
Glucose-Capillary: 138 mg/dL — ABNORMAL HIGH (ref 70–99)

## 2023-11-10 LAB — CK: Total CK: 12237 U/L — ABNORMAL HIGH (ref 38–234)

## 2023-11-10 MED ORDER — LACTATED RINGERS IV SOLN: INTRAVENOUS | Status: AC

## 2023-11-10 NOTE — Progress Notes (Signed)
Physical Therapy Treatment Patient Details Name: Ebony Matthews MRN: [id number] DOB: [birth year] Today's Date: 11/10/2023   History of Present Illness 43 y.o. female presents to [facility] hospital on 11/06/2023 with new R weakness. CT abdomen with finding of a large cystic mass in the pelvis. PMH includes DMII, CVA w/ L hemiplegia, HTN, cocaine use.    PT Comments  Pt tolerated treatment well today. Pt today was able to stand with +2 Max A x3 along EOB. Pt still with difficulty flexing R knee due to pain and muscle cramping. No change in DC/DME recs at this time. PT will continue to follow.     If plan is discharge home, recommend the following: Two people to help with walking and/or transfers;Two people to help with bathing/dressing/bathroom;Assistance with cooking/housework;Direct supervision/assist for medications management;Direct supervision/assist for financial management;Assist for transportation;Help with stairs or ramp for entrance   Can travel by private vehicle     No  Equipment Recommendations  Wheelchair (measurements PT);Hoyer lift;Hospital bed    Recommendations for Other Services       Precautions / Restrictions Precautions Precautions: Fall Precaution/Restrictions Comments: chronic L hemiplegia Restrictions Weight Bearing Restrictions Per Provider Order: No     Mobility  Bed Mobility Overal bed mobility: Needs Assistance Bed Mobility: Supine to Sit, Sit to Supine     Supine to sit: +2 for physical assistance, Total assist Sit to supine: +2 for physical assistance, Total assist   General bed mobility comments: +2 total A via helicopter    Transfers Overall transfer level: Needs assistance Equipment used: 2 person hand held assist Transfers: Sit to/from Stand Sit to Stand: +2 physical assistance, Max assist           General transfer comment: Attempted stedy however pt pt unable to flex R knee. Opted for 2 person HHA which pt performed x3 with +2 Max A     Ambulation/Gait               General Gait Details: Unable   Stairs             Wheelchair Mobility     Tilt Bed    Modified Rankin (Stroke Patients Only)       Balance Overall balance assessment: Needs assistance Sitting-balance support: Single extremity supported, Feet supported Sitting balance-Leahy Scale: Poor Sitting balance - Comments: maxA initially due to posterior lean, improves to close supervision by end of session   Standing balance support: Bilateral upper extremity supported, During functional activity Standing balance-Leahy Scale: Zero                              Communication Communication Communication: No apparent difficulties  Cognition Arousal: Alert Behavior During Therapy: Flat affect   PT - Cognitive impairments: No apparent impairments                         Following commands: Intact      Cueing Cueing Techniques: Verbal cues  Exercises      General Comments General comments (skin integrity, edema, etc.): HR up to 130 with mobility      Pertinent Vitals/Pain Pain Assessment Pain Assessment: Faces Faces Pain Scale: Hurts even more Pain Location: R thigh with knee flexion Pain Descriptors / Indicators: Sore, Cramping, Spasm Pain Intervention(s): Monitored during session, Limited activity within patient's tolerance    Home Living  Prior Function            PT Goals (current goals can now be found in the care plan section) Progress towards PT goals: Progressing toward goals    Frequency    Min 1X/week      PT Plan      Co-evaluation              AM-PAC PT "6 Clicks" Mobility   Outcome Measure  Help needed turning from your back to your side while in a flat bed without using bedrails?: A Lot Help needed moving from lying on your back to sitting on the side of a flat bed without using bedrails?: A Lot Help needed moving to and from a bed  to a chair (including a wheelchair)?: Total Help needed standing up from a chair using your arms (e.g., wheelchair or bedside chair)?: Total Help needed to walk in hospital room?: Total Help needed climbing 3-5 steps with a railing? : Total 6 Click Score: 8    End of Session Equipment Utilized During Treatment: Gait belt Activity Tolerance: Patient limited by pain Patient left: in bed;with call bell/phone within reach;with bed alarm set Nurse Communication: Mobility status;Need for lift equipment PT Visit Diagnosis: Other abnormalities of gait and mobility (R26.89);Muscle weakness (generalized) (M62.81);Other symptoms and signs involving the nervous system (R29.898)     Time: 1610-9604 PT Time Calculation (min) (ACUTE ONLY): 19 min  Charges:    $Therapeutic Activity: 8-22 mins PT General Charges $$ ACUTE PT VISIT: 1 Visit                     Shela Nevin, PT, DPT Acute Rehab Services [phone]    Gladys Damme 11/10/2023, 4:18 PM

## 2023-11-10 NOTE — Progress Notes (Signed)
Patient ID: Ebony Matthews, female   DOB: June 03, 1981, 43 y.o.   MRN: [id number] [facility] Progress Note   Assessment/ Plan:   1. Acute kidney Injury on chronic kidney disease stage IV: Baseline creatinine presumed to be 2.1-2.9 based on labs from August 2024.  Presentation with acute kidney injury that appears to be from a combination of prerenal azotemia +/- rhabdomyolysis from recent fall/thigh injury with elevated CPK level.  She continues to have excellent urine output with renal function currently plateaued at around 4.3 and without any acute electrolyte abnormality or uremic signs/symptoms to prompt need to initiate dialysis.   - reorder IVFs 2.  Rhabdomyolysis: Suspected to be due to trauma sustained during fall of right leg/thigh.  CPK level unchanged overnight after initial appearance of trending down. 3.  Hypertension: Blood pressures currently controlled on monotherapy with metoprolol, monitor with volume expansion. 4.  Anemia: Hemoglobin and hematocrit slightly worse following volume expansion, will continue to monitor trend and for overt losses.  Subjective:    Looks to be getting slightly better. IVFs expired.  Will reorder, CPK trending down slightly   Objective:   BP 132/81   Pulse 82   Temp 98.9 F (37.2 C) (Oral)   Resp 19   Ht 5\' 7"  (1.702 m)   Wt 95.9 kg   LMP 10/03/2023 (Exact Date)   SpO2 100%   BMI 33.11 kg/m   Intake/Output Summary (Last 24 hours) at 11/10/2023 1243 Last data filed at 11/10/2023 0600 Gross per 24 hour  Intake --  Output 3150 ml  Net -3150 ml   Weight change: 0.9 kg  Physical Exam: Gen: Young woman with significant hirsutism resting comfortably in bed. CVS: Pulse regular rhythm, normal rate, S1 and S2 normal Resp: Clear to auscultation bilaterally, no rales/rhonchi Abd: Soft, flat, nontender, bowel sounds normal Ext: No lower extremity edema.   Imaging: MR PELVIS WO CONTRAST Result Date: 11/09/2023 CLINICAL DATA:   Adnexal mass EXAM: MRI PELVIS WITHOUT CONTRAST TECHNIQUE: Multiplanar multisequence MR imaging of the pelvis was performed. No intravenous contrast was administered. COMPARISON:  CT scan 11/06/2023 and older going back to May 19, 2023. Remote ultrasound of the pelvis report from 2011 December FINDINGS: Urinary Tract: Preserved contour to the urinary bladder. Grossly normal course and caliber to the urethra. Bowel: Visualized bowel in the pelvis is nondilated. This includes small and large bowel. Vascular/Lymphatic: Normal caliber aorta and iliac vessels. There is some plaque seen along the left common femoral artery at the edge of the imaging field. No discrete abnormal lymph node enlargement identified in the pelvis. There are several small less than 1 cm in size iliac chain nodes, not pathologic by size criteria. Reproductive: Uterus measures 7.7 x 4.5 by 4.9 cm. Endometrial stripe of 9 mm. Junctional zone close to 8 mm. No cystic areas along the junctional zone. No myometrial lesion. Left ovary has follicles measuring 2.4 x 3.9 cm by 2.8 cm. Largest dominant follicle or small cyst measures 19 x 14 by 18 mm. The right ovary is not seen as a discrete separate structure. There is a very large cystic mass identified in the anterior central pelvis. This complex lesion has dimensions today approaching 18.0 by 10.6 by 22.3 cm. On the CT scan of August 2024 lesion would have measured 20.3 x 17.9 x 11.2 cm. There are some nodular areas along the anterior aspect of this lesion. This was seen before with component of calcification. Example nodular area that is noncalcified measures 4.5 by  1.8 cm in the axial plane on series 6, image 25. This is not as well characterized without the advantage of IV contrast but this area of nodularity in the lesion does show some restricted diffusion. There is some additional small frondlike areas identified elsewhere along the anterior aspect of the cystic lesion including along left  side on series 6, image 30. Due to its size is difficult to ascertain the exact origin of this mass but this very well could be an ovarian lesion and a malignant process is possible. Other:  Small amount of free fluid in the pelvis. Musculoskeletal: No suspicious bone lesions identified.  Anasarca. Evaluation limited by motion. IMPRESSION: Very large cystic lesion identified in the central pelvis with some nodular potential soft tissue elements. Small amount of free fluid the pelvis. Based on the overall appearance this very well could be an ovarian lesion and a cystic neoplasm of the ovary is possible including an aggressive or malignant process. The right ovary is not seen as a separate structure. Recommend surgical consultation. In principle with a size this lesion is difficult to ascertain the exact organ of origin as this does abut several structures. In addition there is some potential soft tissue elements, and if needed additional evaluation with a postcontrast set of images can be obtained. Motion Electronically Signed   By: Karen Kays M.D.   On: 11/09/2023 12:57    Labs: BMET Recent Labs  Lab 11/06/23 1332 11/06/23 1338 11/07/23 0521 11/08/23 0629 11/09/23 0427 11/10/23 0422  NA 139 141 138 139 142 137  K 4.2 4.2 3.8 3.8 3.7 4.1  CL 110 111 109 111 106 108  CO2 16*  --  18* 18* 18* 18*  GLUCOSE 140* 139* 81 102* 124* 131*  BUN 35* 40* 38* 41* 42* 44*  CREATININE 3.99* 4.50* 4.24* 4.29* 4.36* 4.27*  CALCIUM 9.5  --  9.0 8.9 9.2 8.8*   CBC Recent Labs  Lab 11/06/23 1332 11/06/23 1338 11/07/23 0521 11/08/23 0629 11/09/23 0427 11/10/23 0422  WBC 6.0  --  7.3 6.8 7.0 7.5  NEUTROABS 3.8  --   --   --   --   --   HGB 11.6*   < > 9.9* 10.0* 10.3* 10.9*  HCT 37.5   < > 31.4* 31.3* 32.4* 34.3*  MCV 86.2  --  84.9 83.0 84.2 84.1  PLT PLATELET CLUMPS NOTED ON SMEAR, UNABLE TO ESTIMATE   < > 264 289 269 277   < > = values in this interval not displayed.    Medications:      cyanocobalamin  1,000 mcg Subcutaneous Daily   Followed by   Melene Muller ON 11/16/2023] cyanocobalamin  1,000 mcg Subcutaneous Q30 days   diclofenac Sodium  2 g Topical TID AC & HS   folic acid  1 mg Oral Daily   heparin injection (subcutaneous)  5,000 Units Subcutaneous Q8H   insulin aspart  0-9 Units Subcutaneous TID WC   lidocaine  1 patch Transdermal Q24H    Bufford Buttner MD 11/10/2023, 12:43 PM

## 2023-11-10 NOTE — Progress Notes (Addendum)
PROGRESS NOTE    Ebony Matthews  ZOX:[id number] DOB: 1981-01-20 DOA: 11/06/2023 PCP: Ellyn Hack, MD   Chief Complaint  Patient presents with   Extremity Weakness    Brief Narrative:   Ebony Matthews is a 43 y.o. female with medical history significant of T2DM, hx of CVA with left sided deficits, HTN, cocaine use in the past, who presents to ED with complaints of right sided weakness x 2 days.  Patient with baseline dense left hemiparesis, patient reports she slid from her bed to the floor, she reports was on the floor for couple hours and her on called ambulance, workup in ED significant for elevated creatinine at 4 range, most recent was 2.1 just last week, workup significant for rhabdomyolysis, as well with pelvic mass.     Assessment & Plan:   Principal Problem:   Acute renal failure superimposed on stage 4 chronic kidney disease (HCC) Active Problems:   Pelvic mass   Right sided weakness   Transaminitis   Metabolic acidosis   Uncontrolled type 2 diabetes mellitus with hyperglycemia, without long-term current use of insulin (HCC)   Essential hypertension, benign   History of CVA with left sided deficits   AKI (acute kidney injury) (HCC)  Acute renal failure superimposed on stage 4 chronic kidney disease (HCC) Rhabdomyolysis 43 year old presenting with 2 day history of right sided weakness found to have AKI on CKD (creatinine 8/24: 2.9).  Creatinine in 11/01/2023 was 2.1. - Had onset of AKI in August thought due to new pelvic mass  -There is no evidence of hydronephrosis on imaging. -Urinalysis significant for proteinuria . -Continue with IV fluids  -Avoid nephrotoxic medications  -Total CK significant elevated at 23,000, continue with IV fluids, CK trending down, but creatinine remains significantly elevated, continue with IV fluids  -Renal input greatly appreciated -Avoid hypotension, will DC Toprol XL   Pelvic mass CT renal study in 04/2023: Large complex  cystic mass in the pelvis measures 17.9 cm. Favor adnexal origin, possibly representing a low-grade ovarian cystic neoplasm. -she denies any vaginal bleeding -Patient thinks last Pap smear was 2 to 3 years ago -Appreciate GYN consult, CA125 is within normal limit, but MRI pelvis liver reveals large mass, with nodularity and complex status, still concerning for malignancy, discussed with GYN, they will discussed with oncology GYN, will await further recommendations.  Right sided weakness -MRI brain is negative for acute CVA   Transaminitis Concern for congestion from pelvic mass Liver US wnl in 2/25 Tylenol level within normal limit  negative hepatitis panel No acute finding in the liver and CT abdomen pelvis Hold statin Trending down   Metabolic acidosis Secondary to AKI, no AG  Gentle IVF Start on oral bicarb  Normocytic anemia B12 deficiency -Anemia most likely in the setting of chronic kidney disease, hemoglobin is stable -B12 level significantly low at 111, will start on IM supplements  Uncontrolled type 2 diabetes mellitus with hyperglycemia, and hypoglycemia, without long-term current use of insulin (HCC) A1c 6.4 this this admission Hold metformin with AKI/inpatient Hold jardiance  Sensitive SSI and accuchecks QAC/HS -UA significant glucosuria, this is likely due to Jardiance.   Essential hypertension, benign Will DC home Toprol to avoid hypotension due to AKI   History of CVA with left sided deficits History of 2 CVA, last in 2021 with left sided hemiplegia.  She states her left weakness is at baseline. Unable to move her LUE at all and very minimal movement of LLE Hold statin  with elevated LFTs  Resume aspirin if no further workup anticipated    DVT prophylaxis:  heparin Code Status: Full code Family Communication: None at bedside today Disposition:      Consultants:  GYN  Subjective:  No significant events overnight, she denies any complaints today,  reports intermittent right hip and thigh pain with activity, but much improved. Objective: Vitals:   11/10/23 0400 11/10/23 0500 11/10/23 0800 11/10/23 1220  BP: 132/81  131/69 133/85  Pulse: 82  89 92  Resp: 19  19 20   Temp:    98.8 F (37.1 C)  TempSrc:    Oral  SpO2: 100%  98% 99%  Weight:  95.9 kg    Height:        Intake/Output Summary (Last 24 hours) at 11/10/2023 1502 Last data filed at 11/10/2023 1239 Gross per 24 hour  Intake --  Output 3100 ml  Net -3100 ml   Filed Weights   11/08/23 0434 11/09/23 0500 11/10/23 0500  Weight: 92.4 kg 95 kg 95.9 kg    Examination:  Awake Alert, Oriented X 3, dense left hemiparesis at baseline, she is with frontal alopecia, hirsutism Symmetrical Chest wall movement, Good air movement bilaterally, CTAB RRR,No Gallops,Rubs or new Murmurs, No Parasternal Heave +ve B.Sounds, Abd Soft, No tenderness, No rebound - guarding or rigidity. No Cyanosis, Clubbing or edema, No new Rash or bruise       Data Reviewed: I have personally reviewed following labs and imaging studies  CBC: Recent Labs  Lab 11/06/23 1332 11/06/23 1338 11/06/23 1558 11/07/23 0521 11/08/23 0629 11/09/23 0427 11/10/23 0422  WBC 6.0  --   --  7.3 6.8 7.0 7.5  NEUTROABS 3.8  --   --   --   --   --   --   HGB 11.6* 12.2  --  9.9* 10.0* 10.3* 10.9*  HCT 37.5 36.0  --  31.4* 31.3* 32.4* 34.3*  MCV 86.2  --   --  84.9 83.0 84.2 84.1  PLT PLATELET CLUMPS NOTED ON SMEAR, UNABLE TO ESTIMATE  --  259 264 289 269 277    Basic Metabolic Panel: Recent Labs  Lab 11/06/23 1332 11/06/23 1338 11/07/23 0521 11/08/23 0629 11/09/23 0427 11/10/23 0422  NA 139 141 138 139 142 137  K 4.2 4.2 3.8 3.8 3.7 4.1  CL 110 111 109 111 106 108  CO2 16*  --  18* 18* 18* 18*  GLUCOSE 140* 139* 81 102* 124* 131*  BUN 35* 40* 38* 41* 42* 44*  CREATININE 3.99* 4.50* 4.24* 4.29* 4.36* 4.27*  CALCIUM 9.5  --  9.0 8.9 9.2 8.8*    GFR: Estimated Creatinine Clearance: 20 mL/min (A)  (by C-G formula based on SCr of 4.27 mg/dL (H)).  Liver Function Tests: Recent Labs  Lab 11/06/23 1332 11/08/23 0629 11/09/23 0427 11/10/23 0422  AST 381* 236* 221* 227*  ALT 255* 203* 211* 219*  ALKPHOS 59 47 49 56  BILITOT 0.7 0.6 0.4 0.5  PROT 6.6 5.7* 5.7* 5.7*  ALBUMIN 3.4* 2.7* 2.7* 2.7*    CBG: Recent Labs  Lab 11/09/23 0907 11/09/23 1308 11/09/23 1623 11/09/23 2148 11/10/23 1302  GLUCAP 109* 143* 135* 155* 153*     No results found for this or any previous visit (from the past 240 hours).       Radiology Studies: MR PELVIS WO CONTRAST Result Date: 11/09/2023 CLINICAL DATA:  Adnexal mass EXAM: MRI PELVIS WITHOUT CONTRAST TECHNIQUE: Multiplanar multisequence MR imaging of the  pelvis was performed. No intravenous contrast was administered. COMPARISON:  CT scan 11/06/2023 and older going back to May 19, 2023. Remote ultrasound of the pelvis report from 2011 December FINDINGS: Urinary Tract: Preserved contour to the urinary bladder. Grossly normal course and caliber to the urethra. Bowel: Visualized bowel in the pelvis is nondilated. This includes small and large bowel. Vascular/Lymphatic: Normal caliber aorta and iliac vessels. There is some plaque seen along the left common femoral artery at the edge of the imaging field. No discrete abnormal lymph node enlargement identified in the pelvis. There are several small less than 1 cm in size iliac chain nodes, not pathologic by size criteria. Reproductive: Uterus measures 7.7 x 4.5 by 4.9 cm. Endometrial stripe of 9 mm. Junctional zone close to 8 mm. No cystic areas along the junctional zone. No myometrial lesion. Left ovary has follicles measuring 2.4 x 3.9 cm by 2.8 cm. Largest dominant follicle or small cyst measures 19 x 14 by 18 mm. The right ovary is not seen as a discrete separate structure. There is a very large cystic mass identified in the anterior central pelvis. This complex lesion has dimensions today approaching  18.0 by 10.6 by 22.3 cm. On the CT scan of August 2024 lesion would have measured 20.3 x 17.9 x 11.2 cm. There are some nodular areas along the anterior aspect of this lesion. This was seen before with component of calcification. Example nodular area that is noncalcified measures 4.5 by 1.8 cm in the axial plane on series 6, image 25. This is not as well characterized without the advantage of IV contrast but this area of nodularity in the lesion does show some restricted diffusion. There is some additional small frondlike areas identified elsewhere along the anterior aspect of the cystic lesion including along left side on series 6, image 30. Due to its size is difficult to ascertain the exact origin of this mass but this very well could be an ovarian lesion and a malignant process is possible. Other:  Small amount of free fluid in the pelvis. Musculoskeletal: No suspicious bone lesions identified.  Anasarca. Evaluation limited by motion. IMPRESSION: Very large cystic lesion identified in the central pelvis with some nodular potential soft tissue elements. Small amount of free fluid the pelvis. Based on the overall appearance this very well could be an ovarian lesion and a cystic neoplasm of the ovary is possible including an aggressive or malignant process. The right ovary is not seen as a separate structure. Recommend surgical consultation. In principle with a size this lesion is difficult to ascertain the exact organ of origin as this does abut several structures. In addition there is some potential soft tissue elements, and if needed additional evaluation with a postcontrast set of images can be obtained. Motion Electronically Signed   By: Karen Kays M.D.   On: 11/09/2023 12:57        Scheduled Meds:  cyanocobalamin  1,000 mcg Subcutaneous Daily   Followed by   Melene Muller ON 11/16/2023] cyanocobalamin  1,000 mcg Subcutaneous Q30 days   diclofenac Sodium  2 g Topical TID AC & HS   folic acid  1 mg Oral  Daily   heparin injection (subcutaneous)  5,000 Units Subcutaneous Q8H   insulin aspart  0-9 Units Subcutaneous TID WC   lidocaine  1 patch Transdermal Q24H   Continuous Infusions:  lactated ringers 100 mL/hr at 11/10/23 1441     LOS: 3 days    Huey Bienenstock, MD [facility]  To contact the attending provider between 7A-7P or the covering provider during after hours 7P-7A, please log into the web site [email or web address] and access using universal [place] password for that web site. If you do not have the password, please call the hospital operator.  11/10/2023, 3:02 PM

## 2023-11-10 NOTE — TOC Progression Note (Signed)
Transition of Care [facility]) - Progression Note    Patient Details  Name: Ebony Matthews MRN: [id number] Date of Birth: 02-26-1981  [facility] of [facility] CM/SW Contact  Mearl Latin, LCSW Phone Number: 11/10/2023, 12:02 PM  Clinical Narrative:    CSW met with patient and her Aunt was on the phone. They have selected [employer]. CSW made facility aware and will follow for medical stability to begin insurance process.    Expected Discharge Plan: Skilled Nursing Facility Barriers to Discharge: Continued Medical Work up, English as a second language teacher  Expected Discharge Plan and Services In-house Referral: Clinical Social Work   Post Acute Care Choice: Skilled Nursing Facility Living arrangements for the past 2 months: Apartment                                       Social Determinants of Health (SDOH) Interventions SDOH Screenings   Food Insecurity: No Food Insecurity (11/06/2023)  Housing: Unknown (11/06/2023)  Transportation Needs: No Transportation Needs (11/06/2023)  Utilities: Not At Risk (11/06/2023)  Depression (PHQ2-9): Low Risk  (07/17/2020)  Tobacco Use: Medium Risk (11/01/2023)    Readmission Risk Interventions     No data to display

## 2023-11-10 NOTE — Plan of Care (Signed)

## 2023-11-10 NOTE — Progress Notes (Signed)
Subjective: Pt seen a continued consult.  She denies any current pain, nausea or vomiting.  Reviewed history, MRI and lab findings with patient.  Objective: Vital signs in last 24 hours: Temp:  [98.1 F (36.7 C)-98.9 F (37.2 C)] 98.8 F (37.1 C) (02/11 1220) Pulse Rate:  [82-92] 92 (02/11 1220) Resp:  [19-25] 20 (02/11 1220) BP: (129-145)/(69-86) 133/85 (02/11 1220) SpO2:  [98 %-100 %] 99 % (02/11 1220) Weight:  [95.9 kg] 95.9 kg (02/11 0500) Weight change: 0.9 kg   General appearance: alert, cooperative, and no distress Head: Normocephalic, without obvious abnormality, atraumatic Resp: clear to auscultation bilaterally Cardio: regular rate and rhythm GI: soft, nontender, nondistended.  No palpable mass with external exam only.  Lab Results: Recent Labs    11/09/23 0427 11/10/23 0422  WBC 7.0 7.5  HGB 10.3* 10.9*  HCT 32.4* 34.3*  PLT 269 277   BMET:  Recent Labs    11/09/23 0427 11/10/23 0422  NA 142 137  K 3.7 4.1  CL 106 108  CO2 18* 18*  GLUCOSE 124* 131*  BUN 42* 44*  CREATININE 4.36* 4.27*  CALCIUM 9.2 8.8*    Studies/Results: MR PELVIS WO CONTRAST Result Date: 11/09/2023 CLINICAL DATA:  Adnexal mass EXAM: MRI PELVIS WITHOUT CONTRAST TECHNIQUE: Multiplanar multisequence MR imaging of the pelvis was performed. No intravenous contrast was administered. COMPARISON:  CT scan 11/06/2023 and older going back to May 19, 2023. Remote ultrasound of the pelvis report from 2011 December FINDINGS: Urinary Tract: Preserved contour to the urinary bladder. Grossly normal course and caliber to the urethra. Bowel: Visualized bowel in the pelvis is nondilated. This includes small and large bowel. Vascular/Lymphatic: Normal caliber aorta and iliac vessels. There is some plaque seen along the left common femoral artery at the edge of the imaging field. No discrete abnormal lymph node enlargement identified in the pelvis. There are several small less than 1 cm in size iliac  chain nodes, not pathologic by size criteria. Reproductive: Uterus measures 7.7 x 4.5 by 4.9 cm. Endometrial stripe of 9 mm. Junctional zone close to 8 mm. No cystic areas along the junctional zone. No myometrial lesion. Left ovary has follicles measuring 2.4 x 3.9 cm by 2.8 cm. Largest dominant follicle or small cyst measures 19 x 14 by 18 mm. The right ovary is not seen as a discrete separate structure. There is a very large cystic mass identified in the anterior central pelvis. This complex lesion has dimensions today approaching 18.0 by 10.6 by 22.3 cm. On the CT scan of August 2024 lesion would have measured 20.3 x 17.9 x 11.2 cm. There are some nodular areas along the anterior aspect of this lesion. This was seen before with component of calcification. Example nodular area that is noncalcified measures 4.5 by 1.8 cm in the axial plane on series 6, image 25. This is not as well characterized without the advantage of IV contrast but this area of nodularity in the lesion does show some restricted diffusion. There is some additional small frondlike areas identified elsewhere along the anterior aspect of the cystic lesion including along left side on series 6, image 30. Due to its size is difficult to ascertain the exact origin of this mass but this very well could be an ovarian lesion and a malignant process is possible. Other:  Small amount of free fluid in the pelvis. Musculoskeletal: No suspicious bone lesions identified.  Anasarca. Evaluation limited by motion. IMPRESSION: Very large cystic lesion identified in the central pelvis  with some nodular potential soft tissue elements. Small amount of free fluid the pelvis. Based on the overall appearance this very well could be an ovarian lesion and a cystic neoplasm of the ovary is possible including an aggressive or malignant process. The right ovary is not seen as a separate structure. Recommend surgical consultation. In principle with a size this lesion is  difficult to ascertain the exact organ of origin as this does abut several structures. In addition there is some potential soft tissue elements, and if needed additional evaluation with a postcontrast set of images can be obtained. Motion Electronically Signed   By: Karen Kays M.D.   On: 11/09/2023 12:57    I have reviewed the patient's current medications.  Assessment/Plan: Pelvic mass  Ca-125 was in the normal range. Large size of mass, nodularity and complex status of mass are still suspicious for malignancy. Do not think patient needs immediate surgery at this time. Recommend consultation with gyn oncology to look at imaging to decide if removal should be through OB/Gyn or gyn oncology service. Removal would likely be midline incision with pelvic washings or laparoscopic approach along with minilap. If gyn oncology feels the lesion is not malignant please schedule follow up with [facility] on [address] for outpatient consultation. Will sign off for now but we can be reconsulted as needed.   LOS: 3 days   Warden Fillers 11/10/2023,2:06 PM

## 2023-11-10 NOTE — Care Management Important Message (Signed)
Important Message  Patient Details  Name: Ebony Matthews MRN: [id number] Date of Birth: January 15, 1981   Important Message Given:  Yes - Medicare IM     Dorena Bodo 11/10/2023, 2:47 PM

## 2023-11-11 ENCOUNTER — Encounter: Payer: Self-pay | Admitting: Occupational Therapy

## 2023-11-11 ENCOUNTER — Other Ambulatory Visit: Payer: Self-pay | Admitting: Obstetrics and Gynecology

## 2023-11-11 DIAGNOSIS — R531 Weakness: Secondary | ICD-10-CM

## 2023-11-11 DIAGNOSIS — I639 Cerebral infarction, unspecified: Secondary | ICD-10-CM

## 2023-11-11 DIAGNOSIS — R1900 Intra-abdominal and pelvic swelling, mass and lump, unspecified site: Secondary | ICD-10-CM

## 2023-11-11 DIAGNOSIS — R7401 Elevation of levels of liver transaminase levels: Secondary | ICD-10-CM

## 2023-11-11 DIAGNOSIS — N179 Acute kidney failure, unspecified: Secondary | ICD-10-CM | POA: Diagnosis not present

## 2023-11-11 LAB — COMPREHENSIVE METABOLIC PANEL
ALT: 210 U/L — ABNORMAL HIGH (ref 0–44)
AST: 202 U/L — ABNORMAL HIGH (ref 15–41)
Albumin: 2.6 g/dL — ABNORMAL LOW (ref 3.5–5.0)
Alkaline Phosphatase: 55 U/L (ref 38–126)
Anion gap: 12 (ref 5–15)
BUN: 44 mg/dL — ABNORMAL HIGH (ref 6–20)
CO2: 19 mmol/L — ABNORMAL LOW (ref 22–32)
Calcium: 8.5 mg/dL — ABNORMAL LOW (ref 8.9–10.3)
Chloride: 105 mmol/L (ref 98–111)
Creatinine, Ser: 4.02 mg/dL — ABNORMAL HIGH (ref 0.44–1.00)
GFR, Estimated: 13 mL/min — ABNORMAL LOW (ref >60)
Glucose, Bld: 127 mg/dL — ABNORMAL HIGH (ref 70–99)
Potassium: 3.7 mmol/L (ref 3.5–5.1)
Sodium: 136 mmol/L (ref 135–145)
Total Bilirubin: 0.5 mg/dL (ref 0.0–1.2)
Total Protein: 5.6 g/dL — ABNORMAL LOW (ref 6.5–8.1)

## 2023-11-11 LAB — CBC
HCT: 30.5 % — ABNORMAL LOW (ref 36.0–46.0)
Hemoglobin: 9.7 g/dL — ABNORMAL LOW (ref 12.0–15.0)
MCH: 26.6 pg (ref 26.0–34.0)
MCHC: 31.8 g/dL (ref 30.0–36.0)
MCV: 83.8 fL (ref 80.0–100.0)
Platelets: 287 10*3/uL (ref 150–400)
RBC: 3.64 MIL/uL — ABNORMAL LOW (ref 3.87–5.11)
RDW: 18.1 % — ABNORMAL HIGH (ref 11.5–15.5)
WBC: 6.8 10*3/uL (ref 4.0–10.5)
nRBC: 0 % (ref 0.0–0.2)

## 2023-11-11 LAB — GLUCOSE, CAPILLARY
Glucose-Capillary: 162 mg/dL — ABNORMAL HIGH (ref 70–99)
Glucose-Capillary: 169 mg/dL — ABNORMAL HIGH (ref 70–99)
Glucose-Capillary: 153 mg/dL — ABNORMAL HIGH (ref 70–99)

## 2023-11-11 LAB — LACTATE DEHYDROGENASE: LDH: 591 U/L — ABNORMAL HIGH (ref 98–192)

## 2023-11-11 LAB — CK: Total CK: 9669 U/L — ABNORMAL HIGH (ref 38–234)

## 2023-11-11 MED ORDER — ASPIRIN 81 MG PO TBEC
81.0000 mg | DELAYED_RELEASE_TABLET | Freq: Every day | ORAL | Status: DC
  Administered 2023-11-11 – 2023-11-14 (×4): 81 mg via ORAL
  Filled 2023-11-11 (×4): qty 1

## 2023-11-11 MED ORDER — LACTATED RINGERS IV SOLN: INTRAVENOUS | Status: AC

## 2023-11-11 MED ORDER — CLOPIDOGREL BISULFATE 75 MG PO TABS
75.0000 mg | ORAL_TABLET | Freq: Every day | ORAL | Status: DC
  Administered 2023-11-11 – 2023-11-14 (×4): 75 mg via ORAL
  Filled 2023-11-11 (×4): qty 1

## 2023-11-11 NOTE — Progress Notes (Signed)
Patient ID: Ebony Matthews, female   DOB: 1981/07/02, 43 y.o.   MRN: [id number] [facility] Progress Note   Assessment/ Plan:   1. Acute kidney Injury on chronic kidney disease stage IV: Baseline creatinine presumed to be 2.1-2.9 based on labs from August 2024.  Presentation with acute kidney injury that appears to be from a combination of prerenal azotemia +/- rhabdomyolysis from recent fall/thigh injury with elevated CPK level.  She continues to have excellent urine output with renal function improving albeit slowly.   NO acute electrolyte abnormality or uremic signs/symptoms to prompt need to initiate dialysis.   - continue IVFs 2.  Rhabdomyolysis: Suspected to be due to trauma sustained during fall of right leg/thigh.  CPK level  trending down. 3.  Hypertension: Blood pressures currently controlled on monotherapy with metoprolol, monitor with volume expansion. 4.  Anemia: Hemoglobin and hematocrit slightly worse following volume expansion, will continue to monitor trend and for overt losses.  Subjective:    No new complaints today-  CPK trending down again but still high-  good UOP and crt trending down slowly as well   Objective:   BP (!) 140/76 (BP Location: Right Arm)   Pulse 92   Temp 98.5 F (36.9 C) (Oral)   Resp 18   Ht 5\' 7"  (1.702 m)   Wt 97.8 kg   LMP 10/03/2023 (Exact Date)   SpO2 97%   BMI 33.77 kg/m   Intake/Output Summary (Last 24 hours) at 11/11/2023 1142 Last data filed at 11/11/2023 0534 Gross per 24 hour  Intake --  Output 2950 ml  Net -2950 ml   Weight change: 2.1 kg  Physical Exam: Gen: Young woman with significant hirsutism resting comfortably in bed. CVS: Pulse regular rhythm, normal rate, S1 and S2 normal Resp: Clear to auscultation bilaterally, no rales/rhonchi Abd: Soft, flat, nontender, bowel sounds normal Ext: No lower extremity edema.   Imaging: No results found.   Labs: BMET Recent Labs  Lab 11/06/23 1332 11/06/23 1338  11/07/23 0521 11/08/23 0629 11/09/23 0427 11/10/23 0422 11/11/23 0420  NA 139 141 138 139 142 137 136  K 4.2 4.2 3.8 3.8 3.7 4.1 3.7  CL 110 111 109 111 106 108 105  CO2 16*  --  18* 18* 18* 18* 19*  GLUCOSE 140* 139* 81 102* 124* 131* 127*  BUN 35* 40* 38* 41* 42* 44* 44*  CREATININE 3.99* 4.50* 4.24* 4.29* 4.36* 4.27* 4.02*  CALCIUM 9.5  --  9.0 8.9 9.2 8.8* 8.5*   CBC Recent Labs  Lab 11/06/23 1332 11/06/23 1338 11/08/23 0629 11/09/23 0427 11/10/23 0422 11/11/23 0420  WBC 6.0   < > 6.8 7.0 7.5 6.8  NEUTROABS 3.8  --   --   --   --   --   HGB 11.6*   < > 10.0* 10.3* 10.9* 9.7*  HCT 37.5   < > 31.3* 32.4* 34.3* 30.5*  MCV 86.2   < > 83.0 84.2 84.1 83.8  PLT PLATELET CLUMPS NOTED ON SMEAR, UNABLE TO ESTIMATE   < > 289 269 277 287   < > = values in this interval not displayed.    Medications:     cyanocobalamin  1,000 mcg Subcutaneous Daily   Followed by   Melene Muller ON 11/16/2023] cyanocobalamin  1,000 mcg Subcutaneous Q30 days   diclofenac Sodium  2 g Topical TID AC & HS   folic acid  1 mg Oral Daily   heparin injection (subcutaneous)  5,000 Units Subcutaneous  Q8H   insulin aspart  0-9 Units Subcutaneous TID WC   lidocaine  1 patch Transdermal Q24H    Ebony Matthews A Sanuel Ladnier  11/11/2023, 11:42 AM

## 2023-11-11 NOTE — Progress Notes (Signed)
   11/11/23 1414  Mobility  Activity Transferred from bed to chair  Level of Assistance +2 (takes two people) (MaxA)  Assistive Device Other (Comment) (HHA - pt unable to use stedy d/t RLE quad muscle cramp. Cannot fully bend R knee.)  Activity Response Tolerated fair  Mobility Referral Yes  Mobility visit 1 Mobility  Mobility Specialist Start Time (ACUTE ONLY) 1340  Mobility Specialist Stop Time (ACUTE ONLY) 1414  Mobility Specialist Time Calculation (min) (ACUTE ONLY) 34 min   Mobility Specialist: Progress Note  Pre-Mobility:      HR 90, SpO2 99% RA  Pt agreeable to mobility session - received in bed. C/o RLE quad muscle cramping throughout. Returned to chair with all needs met - call bell within reach. NT present during session. Pt stated it "feels good to sit up."  Barnie Mort, BS Mobility Specialist Please contact via SecureChat or  Rehab office at [phone].

## 2023-11-11 NOTE — Progress Notes (Signed)
Referral sent for gyn onc to eval and treat pelvic for pelvic mass.  Dr. Pricilla Holm is aware.

## 2023-11-11 NOTE — Plan of Care (Signed)

## 2023-11-11 NOTE — Progress Notes (Signed)
PROGRESS NOTE        PATIENT DETAILS Name: Ebony Matthews Age: 43 y.o. Sex: female Date of Birth: 18-Mar-1981 Admit Date: 11/06/2023 Admitting Physician Orland Mustard, MD WGN:FAOZ, Melanee Spry, MD  Brief Summary: Patient is a 43 y.o.  female with hx of CVA w left sided weakness, HTN, CKD 4-presented with fall-slid from bed to floor-found to have AKI with rhabdomyolysis  Significant events: 2/7>>admit to [facility]  Significant studies: 2/7>>MRI Brain: no acute intracranial abnormality 2/7>>MRI LS spine:L4-L5 broad based disc protusion 2/7>>Renal Ultrasound:no hydronephroses  Significant microbiology data: None  Procedures: None  Consults: Nephrology GYN  Subjective: No major issues overnight-lying comfortably in bed.  Objective: Vitals: Blood pressure (!) 140/76, pulse 92, temperature 98.5 F (36.9 C), temperature source Oral, resp. rate 18, height 5\' 7"  (1.702 m), weight 97.8 kg, last menstrual period 10/03/2023, SpO2 97%.   Exam: Gen Exam:Alert awake-not in any distress HEENT:atraumatic, normocephalic Chest: B/L clear to auscultation anteriorly CVS:S1S2 regular Abdomen:soft non tender, non distended Extremities:no edema Neurology: Non focal Skin: no rash  Pertinent Labs/Radiology:    Latest Ref Rng & Units 11/11/2023    4:20 AM 11/10/2023    4:22 AM 11/09/2023    4:27 AM  CBC  WBC 4.0 - 10.5 K/uL 6.8  7.5  7.0   Hemoglobin 12.0 - 15.0 g/dL 9.7  30.8  65.7   Hematocrit 36.0 - 46.0 % 30.5  34.3  32.4   Platelets 150 - 400 K/uL 287  277  269     Lab Results  Component Value Date   NA 136 11/11/2023   K 3.7 11/11/2023   CL 105 11/11/2023   CO2 19 (L) 11/11/2023      Assessment/Plan: AKI on CKD 4 Secondary to sepsis-possibly mediated kidney injury. Creatinine slowly improving with IVF Avoid nephrotoxic agents. Follow electrolytes Nephrology following.  Rhabdomyolysis Secondary to being on the floor for prolonged time CK  improving with IVF  Right-sided weakness Secondary to rhabdomyolysis-CVA ruled out-MRI negative. Continue to treat underlying rhabdomyolysis Mobilize with PT/OT.  Transaminitis Suspect this is from rhabdomyolysis Slowly downtrending with supportive care Acute hepatitis serology negative  HTN BP stable All antihypertensives on hold  DM-2 (A1c 6.4 on 2/7) CBG stable-SSI  CVA with left hemiparesis Resume aspirin/Plavix Hold statin until CK improves further.  Pelvic mass Incidental finding on imaging studies Appreciate GYN input-outpatient referral sent for ongoing GYN.  Class 1 Obesity: Estimated body mass index is 33.77 kg/m as calculated from the following:   Height as of this encounter: 5\' 7"  (1.702 m).   Weight as of this encounter: 97.8 kg.   Code status:   Code Status: Full Code   DVT Prophylaxis: heparin injection 5,000 Units Start: 11/07/23 1700 SCDs Start: 11/06/23 1722 Place TED hose Start: 11/06/23 1722   Family Communication: None at bedside   Disposition Plan: Status is: Inpatient Remains inpatient appropriate because: Severity of illness   Planned Discharge Destination:Home   Diet: Diet Order             Diet heart healthy/carb modified Fluid consistency: Thin  Diet effective now                     Antimicrobial agents: Anti-infectives (From admission, onward)    None        MEDICATIONS: Scheduled Meds:  cyanocobalamin  1,000 mcg  Subcutaneous Daily   Followed by   Melene Muller ON 11/16/2023] cyanocobalamin  1,000 mcg Subcutaneous Q30 days   diclofenac Sodium  2 g Topical TID AC & HS   folic acid  1 mg Oral Daily   heparin injection (subcutaneous)  5,000 Units Subcutaneous Q8H   insulin aspart  0-9 Units Subcutaneous TID WC   lidocaine  1 patch Transdermal Q24H   Continuous Infusions:  lactated ringers 100 mL/hr at 11/11/23 1231   PRN Meds:.cyclobenzaprine, ondansetron **OR** ondansetron (ZOFRAN) IV, senna   I have  personally reviewed following labs and imaging studies  LABORATORY DATA: CBC: Recent Labs  Lab 11/06/23 1332 11/06/23 1338 11/07/23 0521 11/08/23 0629 11/09/23 0427 11/10/23 0422 11/11/23 0420  WBC 6.0  --  7.3 6.8 7.0 7.5 6.8  NEUTROABS 3.8  --   --   --   --   --   --   HGB 11.6*   < > 9.9* 10.0* 10.3* 10.9* 9.7*  HCT 37.5   < > 31.4* 31.3* 32.4* 34.3* 30.5*  MCV 86.2  --  84.9 83.0 84.2 84.1 83.8  PLT PLATELET CLUMPS NOTED ON SMEAR, UNABLE TO ESTIMATE   < > 264 289 269 277 287   < > = values in this interval not displayed.    Basic Metabolic Panel: Recent Labs  Lab 11/07/23 0521 11/08/23 0629 11/09/23 0427 11/10/23 0422 11/11/23 0420  NA 138 139 142 137 136  K 3.8 3.8 3.7 4.1 3.7  CL 109 111 106 108 105  CO2 18* 18* 18* 18* 19*  GLUCOSE 81 102* 124* 131* 127*  BUN 38* 41* 42* 44* 44*  CREATININE 4.24* 4.29* 4.36* 4.27* 4.02*  CALCIUM 9.0 8.9 9.2 8.8* 8.5*    GFR: Estimated Creatinine Clearance: 21.5 mL/min (A) (by C-G formula based on SCr of 4.02 mg/dL (H)).  Liver Function Tests: Recent Labs  Lab 11/06/23 1332 11/08/23 0629 11/09/23 0427 11/10/23 0422 11/11/23 0420  AST 381* 236* 221* 227* 202*  ALT 255* 203* 211* 219* 210*  ALKPHOS 59 47 49 56 55  BILITOT 0.7 0.6 0.4 0.5 0.5  PROT 6.6 5.7* 5.7* 5.7* 5.6*  ALBUMIN 3.4* 2.7* 2.7* 2.7* 2.6*   No results for input(s): "LIPASE", "AMYLASE" in the last 168 hours. No results for input(s): "AMMONIA" in the last 168 hours.  Coagulation Profile: Recent Labs  Lab 11/06/23 2138  INR 1.1    Cardiac Enzymes: Recent Labs  Lab 11/06/23 2138 11/08/23 0629 11/09/23 0427 11/10/23 0422 11/11/23 0420  CKTOTAL 23,267* 15,852* 14,591* 12,237* 9,669*    BNP (last 3 results) No results for input(s): "PROBNP" in the last 8760 hours.  Lipid Profile: No results for input(s): "CHOL", "HDL", "LDLCALC", "TRIG", "CHOLHDL", "LDLDIRECT" in the last 72 hours.  Thyroid Function Tests: No results for input(s):  "TSH", "T4TOTAL", "FREET4", "T3FREE", "THYROIDAB" in the last 72 hours.  Anemia Panel: Recent Labs    11/09/23 0426  FOLATE 6.7    Urine analysis:    Component Value Date/Time   COLORURINE YELLOW 11/06/2023 1655   APPEARANCEUR HAZY (A) 11/06/2023 1655   LABSPEC 1.008 11/06/2023 1655   PHURINE 6.0 11/06/2023 1655   GLUCOSEU >=500 (A) 11/06/2023 1655   HGBUR LARGE (A) 11/06/2023 1655   BILIRUBINUR NEGATIVE 11/06/2023 1655   KETONESUR NEGATIVE 11/06/2023 1655   PROTEINUR 100 (A) 11/06/2023 1655   UROBILINOGEN 0.2 02/22/2014 1730   NITRITE NEGATIVE 11/06/2023 1655   LEUKOCYTESUR NEGATIVE 11/06/2023 1655    Sepsis Labs: Lactic Acid, Venous No  results found for: "LATICACIDVEN"  MICROBIOLOGY: No results found for this or any previous visit (from the past 240 hours).  RADIOLOGY STUDIES/RESULTS: No results found.   LOS: 4 days   Jeoffrey Massed, MD  [facility]    To contact the attending provider between 7A-7P or the covering provider during after hours 7P-7A, please log into the web site [email or web address] and access using universal [place] password for that web site. If you do not have the password, please call the hospital operator.  11/11/2023, 3:06 PM

## 2023-11-11 NOTE — Therapy (Signed)
[facility] [address] [place], [place], [address] Phone: [phone]   Fax:  [phone]  Patient Details  Name: Ebony Matthews MRN: [id number] Date of Birth: 1981-08-31 Referring Provider:  No ref. provider found  OCCUPATIONAL THERAPY DISCHARGE SUMMARY  Visits from Start of Care: 11  Current functional level related to goals / functional outcomes: Pt was making progress towards all goals and was pleased with outcomes but did not return to outpt OT s/p ED visit.  Pt had a new custom fabricated splint and education had begun re using mirror image training with she had purchased a hand rehab robotic glove to facilitate LUE movements.   Remaining deficits: Pt has chronic functional deficits due to h/o CVA but had various materials and education to help with self-management. See tx notes for more details.   Patient OT episode discharged due to not returning to outpatient occupational therapy and will need a new referral to resume OT services in the future    Victorino Sparrow, OT 11/11/2023, 9:19 AM  [facility] [address] [place], [place], [address] Phone: [phone]   Fax:  [phone]

## 2023-11-12 DIAGNOSIS — I639 Cerebral infarction, unspecified: Secondary | ICD-10-CM | POA: Diagnosis not present

## 2023-11-12 DIAGNOSIS — R531 Weakness: Secondary | ICD-10-CM | POA: Diagnosis not present

## 2023-11-12 DIAGNOSIS — N179 Acute kidney failure, unspecified: Secondary | ICD-10-CM | POA: Diagnosis not present

## 2023-11-12 DIAGNOSIS — R7401 Elevation of levels of liver transaminase levels: Secondary | ICD-10-CM | POA: Diagnosis not present

## 2023-11-12 LAB — COMPREHENSIVE METABOLIC PANEL
ALT: 197 U/L — ABNORMAL HIGH (ref 0–44)
AST: 176 U/L — ABNORMAL HIGH (ref 15–41)
Albumin: 2.6 g/dL — ABNORMAL LOW (ref 3.5–5.0)
Alkaline Phosphatase: 53 U/L (ref 38–126)
Anion gap: 13 (ref 5–15)
BUN: 41 mg/dL — ABNORMAL HIGH (ref 6–20)
CO2: 17 mmol/L — ABNORMAL LOW (ref 22–32)
Calcium: 8.3 mg/dL — ABNORMAL LOW (ref 8.9–10.3)
Chloride: 107 mmol/L (ref 98–111)
Creatinine, Ser: 3.77 mg/dL — ABNORMAL HIGH (ref 0.44–1.00)
GFR, Estimated: 14 mL/min — ABNORMAL LOW (ref >60)
Glucose, Bld: 134 mg/dL — ABNORMAL HIGH (ref 70–99)
Potassium: 3.9 mmol/L (ref 3.5–5.1)
Sodium: 137 mmol/L (ref 135–145)
Total Bilirubin: 0.6 mg/dL (ref 0.0–1.2)
Total Protein: 5.5 g/dL — ABNORMAL LOW (ref 6.5–8.1)

## 2023-11-12 LAB — AFP TUMOR MARKER: AFP, Serum, Tumor Marker: 2.3 ng/mL (ref 0.0–6.4)

## 2023-11-12 LAB — CBC
HCT: 29.9 % — ABNORMAL LOW (ref 36.0–46.0)
Hemoglobin: 9.4 g/dL — ABNORMAL LOW (ref 12.0–15.0)
MCH: 26.9 pg (ref 26.0–34.0)
MCHC: 31.4 g/dL (ref 30.0–36.0)
MCV: 85.4 fL (ref 80.0–100.0)
Platelets: 301 10*3/uL (ref 150–400)
RBC: 3.5 MIL/uL — ABNORMAL LOW (ref 3.87–5.11)
RDW: 18.6 % — ABNORMAL HIGH (ref 11.5–15.5)
WBC: 6.1 10*3/uL (ref 4.0–10.5)
nRBC: 0 % (ref 0.0–0.2)

## 2023-11-12 LAB — GLUCOSE, CAPILLARY
Glucose-Capillary: 109 mg/dL — ABNORMAL HIGH (ref 70–99)
Glucose-Capillary: 182 mg/dL — ABNORMAL HIGH (ref 70–99)
Glucose-Capillary: 129 mg/dL — ABNORMAL HIGH (ref 70–99)
Glucose-Capillary: 177 mg/dL — ABNORMAL HIGH (ref 70–99)

## 2023-11-12 LAB — CANCER ANTIGEN 19-9: CA 19-9: 29 U/mL (ref 0–35)

## 2023-11-12 LAB — BETA HCG QUANT (REF LAB): hCG Quant: <1 m[IU]/mL

## 2023-11-12 LAB — CEA: CEA: 2.3 ng/mL (ref 0.0–4.7)

## 2023-11-12 LAB — CK: Total CK: 8045 U/L — ABNORMAL HIGH (ref 38–234)

## 2023-11-12 NOTE — Progress Notes (Addendum)
   11/12/23 0914  Mobility  Activity Transferred from bed to chair  Level of Assistance +2 (takes two people) (MaxA)  Assistive Device Other (Comment) (HHA)  Activity Response Tolerated fair  Mobility Referral Yes  Mobility visit 1 Mobility  Mobility Specialist Start Time (ACUTE ONLY) 0850  Mobility Specialist Stop Time (ACUTE ONLY) 0914  Mobility Specialist Time Calculation (min) (ACUTE ONLY) 24 min   Mobility Specialist: Progress Note Visits: 3  Pre-Mobility:      HR 90, SpO2 96% Pt agreeable to mobility session - received in bed. C/o RLE quad muscle cramping. Pt able to transfer on second attempt STS. Returned to chair with all needs met - call bell within reach. NT present.  Pt agreeable to mobility session - received in chair. C/o BLE quad muscle cramping/pain. Pt able to transfer on third  attempt STS. Returned to bed with all needs met - call bell within reach.    MS offered assistance during bed bath. Pt agreeable to mobility session - received in bed. Pt was asymptomatic throughout session with no complaints. Left in bed with all needs met - call bell within reach. NT present.    Barnie Mort, BS Mobility Specialist Please contact via SecureChat or  Rehab office at [phone].

## 2023-11-12 NOTE — Progress Notes (Signed)
Patient ID: Ebony Matthews, female   DOB: [birth year], 43 y.o.   MRN: [id number] [facility] Progress Note   Assessment/ Plan:   1. Acute kidney Injury on chronic kidney disease stage IV: Baseline creatinine presumed to be 2.1-2.9 based on labs from August 2024.  Presentation with acute kidney injury that appears to be from a combination of prerenal azotemia +/- rhabdomyolysis from recent fall/thigh injury with elevated CPK level.  She continues to have excellent urine output with renal function improving albeit slowly.   NO acute electrolyte abnormality or uremic signs/symptoms to prompt need to initiate dialysis.   - continue IVFs for another 24- 48 hrs as guided by CK - nothing else to add- will sign off.  Call with quesitons 2.  Rhabdomyolysis: Suspected to be due to trauma sustained during fall of right leg/thigh.  CPK level  trending down. 3.  Hypertension: Blood pressures currently controlled on monotherapy with metoprolol, monitor with volume expansion. 4.  Anemia: Hemoglobin and hematocrit slightly worse following volume expansion, will continue to monitor trend and for overt losses.  Subjective:    Seen in room.  Doing better.  No issues today.     Objective:   BP 112/73   Pulse 86   Temp 98 F (36.7 C) (Oral)   Resp 18   Ht 5\' 7"  (1.702 m)   Wt 78.7 kg   LMP 10/03/2023 (Exact Date)   SpO2 99%   BMI 27.17 kg/m   Intake/Output Summary (Last 24 hours) at 11/12/2023 1154 Last data filed at 11/12/2023 0400 Gross per 24 hour  Intake --  Output 2450 ml  Net -2450 ml   Weight change: -19.3 kg  Physical Exam: Gen: Young woman with significant hirsutism resting comfortably in bed. CVS: Pulse regular rhythm, normal rate, S1 and S2 normal Resp: Clear to auscultation bilaterally, no rales/rhonchi Abd: Soft, flat, nontender, bowel sounds normal Ext: No lower extremity edema.   Imaging: No results found.   Labs: BMET Recent Labs  Lab 11/06/23 1332  11/06/23 1338 11/07/23 0521 11/08/23 0629 11/09/23 0427 11/10/23 0422 11/11/23 0420 11/12/23 0514  NA 139 141 138 139 142 137 136 137  K 4.2 4.2 3.8 3.8 3.7 4.1 3.7 3.9  CL 110 111 109 111 106 108 105 107  CO2 16*  --  18* 18* 18* 18* 19* 17*  GLUCOSE 140* 139* 81 102* 124* 131* 127* 134*  BUN 35* 40* 38* 41* 42* 44* 44* 41*  CREATININE 3.99* 4.50* 4.24* 4.29* 4.36* 4.27* 4.02* 3.77*  CALCIUM 9.5  --  9.0 8.9 9.2 8.8* 8.5* 8.3*   CBC Recent Labs  Lab 11/06/23 1332 11/06/23 1338 11/09/23 0427 11/10/23 0422 11/11/23 0420 11/12/23 0514  WBC 6.0   < > 7.0 7.5 6.8 6.1  NEUTROABS 3.8  --   --   --   --   --   HGB 11.6*   < > 10.3* 10.9* 9.7* 9.4*  HCT 37.5   < > 32.4* 34.3* 30.5* 29.9*  MCV 86.2   < > 84.2 84.1 83.8 85.4  PLT PLATELET CLUMPS NOTED ON SMEAR, UNABLE TO ESTIMATE   < > 269 277 287 301   < > = values in this interval not displayed.    Medications:     aspirin EC  81 mg Oral Daily   clopidogrel  75 mg Oral Daily   cyanocobalamin  1,000 mcg Subcutaneous Daily   Followed by   Melene Muller ON 11/16/2023] cyanocobalamin  1,000 mcg  Subcutaneous Q30 days   diclofenac Sodium  2 g Topical TID AC & HS   folic acid  1 mg Oral Daily   heparin injection (subcutaneous)  5,000 Units Subcutaneous Q8H   insulin aspart  0-9 Units Subcutaneous TID WC   lidocaine  1 patch Transdermal Q24H    Lalanya Rufener  11/12/2023, 11:54 AM

## 2023-11-12 NOTE — TOC Progression Note (Addendum)
Transition of Care [facility]) - Progression Note    Patient Details  Name: Ebony Matthews MRN: [id number] Date of Birth: May 27, 1981  Transition of Care [facility] CM/SW Contact  Mearl Latin, LCSW Phone Number: 11/12/2023, 8:48 AM  Clinical Narrative:    8:48am-CSW continuing to follow for medical stability to begin insurance process for [facility] SN[facility].   2:28 PM-Per MD, likely ready for discharge over the weekend. CSW initiated and received insurance approval for [facility], Ref# [id number], effective 11/13/2023-11/15/2023 (2/17 by midnight).  Expected Discharge Plan: Skilled Nursing Facility Barriers to Discharge: Continued Medical Work up, English as a second language teacher  Expected Discharge Plan and Services In-house Referral: Clinical Social Work   Post Acute Care Choice: Skilled Nursing Facility Living arrangements for the past 2 months: Apartment                                       Social Determinants of Health (SDOH) Interventions SDOH Screenings   Food Insecurity: No Food Insecurity (11/06/2023)  Housing: Unknown (11/06/2023)  Transportation Needs: No Transportation Needs (11/06/2023)  Utilities: Not At Risk (11/06/2023)  Depression (PHQ2-9): Low Risk  (07/17/2020)  Tobacco Use: Medium Risk (11/01/2023)    Readmission Risk Interventions     No data to display

## 2023-11-12 NOTE — Progress Notes (Signed)
PROGRESS NOTE        PATIENT DETAILS Name: Ebony Matthews Age: 43 y.o. Sex: female Date of Birth: 05/18/1981 Admit Date: 11/06/2023 Admitting Physician Orland Mustard, MD ONG:EXBM, Melanee Spry, MD  Brief Summary: Patient is a 43 y.o.  female with hx of CVA w left sided weakness, HTN, CKD 4-presented with fall-slid from bed to floor-found to have AKI with rhabdomyolysis  Significant events: 2/7>>admit to [facility]  Significant studies: 2/7>>MRI Brain: no acute intracranial abnormality 2/7>>MRI LS spine:L4-L5 broad based disc protusion 2/7>>Renal Ultrasound:no hydronephroses  Significant microbiology data: None  Procedures: None  Consults: Nephrology GYN  Subjective: No complaints-lying comfortably in bed.  Objective: Vitals: Blood pressure 112/73, pulse 86, temperature 98 F (36.7 C), temperature source Oral, resp. rate 18, height 5\' 7"  (1.702 m), weight 78.7 kg, last menstrual period 10/03/2023, SpO2 99%.   Exam: Gen Exam:Alert awake-not in any distress HEENT:atraumatic, normocephalic Chest: B/L clear to auscultation anteriorly CVS:S1S2 regular Abdomen:soft non tender, non distended Extremities:no edema Neurology: Left-sided hemiparesis unchanged. Skin: no rash  Pertinent Labs/Radiology:    Latest Ref Rng & Units 11/12/2023    5:14 AM 11/11/2023    4:20 AM 11/10/2023    4:22 AM  CBC  WBC 4.0 - 10.5 K/uL 6.1  6.8  7.5   Hemoglobin 12.0 - 15.0 g/dL 9.4  9.7  84.1   Hematocrit 36.0 - 46.0 % 29.9  30.5  34.3   Platelets 150 - 400 K/uL 301  287  277     Lab Results  Component Value Date   NA 137 11/12/2023   K 3.9 11/12/2023   CL 107 11/12/2023   CO2 17 (L) 11/12/2023      Assessment/Plan: AKI on CKD 4 Secondary to sepsis-possibly mediated kidney injury. Creatinine slowly improving with IVF Avoid nephrotoxic agents. Follow electrolytes Nephrology following.  Rhabdomyolysis Secondary to being on the floor for prolonged time CK  improving with IVF  Right-sided weakness Secondary to rhabdomyolysis-CVA ruled out-MRI negative. Continue to treat underlying rhabdomyolysis Mobilize with PT/OT.  Transaminitis Suspect this is from rhabdomyolysis Slowly downtrending with supportive care Acute hepatitis serology negative  HTN BP stable-all antihypertensives remain on hold  DM-2 (A1c 6.4 on 2/7) CBG stable-continue SSI  Recent Labs    11/11/23 1119 11/11/23 1738 11/12/23 0746  GLUCAP 169* 153* 109*     CVA with left hemiparesis Continue aspirin/Plavix Hold statin until CK improves further.  Pelvic mass Incidental finding on imaging studies Appreciate GYN input-outpatient referral sent for ongoing GYN.  Class 1 Obesity: Estimated body mass index is 27.17 kg/m as calculated from the following:   Height as of this encounter: 5\' 7"  (1.702 m).   Weight as of this encounter: 78.7 kg.   Code status:   Code Status: Full Code   DVT Prophylaxis: heparin injection 5,000 Units Start: 11/07/23 1700 SCDs Start: 11/06/23 1722 Place TED hose Start: 11/06/23 1722   Family Communication: None at bedside   Disposition Plan: Status is: Inpatient Remains inpatient appropriate because: Severity of illness   Planned Discharge Destination:Home   Diet: Diet Order             Diet heart healthy/carb modified Fluid consistency: Thin  Diet effective now                     Antimicrobial agents: Anti-infectives (From admission, onward)  None        MEDICATIONS: Scheduled Meds:  aspirin EC  81 mg Oral Daily   clopidogrel  75 mg Oral Daily   cyanocobalamin  1,000 mcg Subcutaneous Daily   Followed by   Melene Muller ON 11/16/2023] cyanocobalamin  1,000 mcg Subcutaneous Q30 days   diclofenac Sodium  2 g Topical TID AC & HS   folic acid  1 mg Oral Daily   heparin injection (subcutaneous)  5,000 Units Subcutaneous Q8H   insulin aspart  0-9 Units Subcutaneous TID WC   lidocaine  1 patch Transdermal  Q24H   Continuous Infusions:  lactated ringers 100 mL/hr at 11/12/23 0854   PRN Meds:.cyclobenzaprine, ondansetron **OR** ondansetron (ZOFRAN) IV, senna   I have personally reviewed following labs and imaging studies  LABORATORY DATA: CBC: Recent Labs  Lab 11/06/23 1332 11/06/23 1338 11/08/23 0629 11/09/23 0427 11/10/23 0422 11/11/23 0420 11/12/23 0514  WBC 6.0   < > 6.8 7.0 7.5 6.8 6.1  NEUTROABS 3.8  --   --   --   --   --   --   HGB 11.6*   < > 10.0* 10.3* 10.9* 9.7* 9.4*  HCT 37.5   < > 31.3* 32.4* 34.3* 30.5* 29.9*  MCV 86.2   < > 83.0 84.2 84.1 83.8 85.4  PLT PLATELET CLUMPS NOTED ON SMEAR, UNABLE TO ESTIMATE   < > 289 269 277 287 301   < > = values in this interval not displayed.    Basic Metabolic Panel: Recent Labs  Lab 11/08/23 0629 11/09/23 0427 11/10/23 0422 11/11/23 0420 11/12/23 0514  NA 139 142 137 136 137  K 3.8 3.7 4.1 3.7 3.9  CL 111 106 108 105 107  CO2 18* 18* 18* 19* 17*  GLUCOSE 102* 124* 131* 127* 134*  BUN 41* 42* 44* 44* 41*  CREATININE 4.29* 4.36* 4.27* 4.02* 3.77*  CALCIUM 8.9 9.2 8.8* 8.5* 8.3*    GFR: Estimated Creatinine Clearance: 20.6 mL/min (A) (by C-G formula based on SCr of 3.77 mg/dL (H)).  Liver Function Tests: Recent Labs  Lab 11/08/23 0629 11/09/23 0427 11/10/23 0422 11/11/23 0420 11/12/23 0514  AST 236* 221* 227* 202* 176*  ALT 203* 211* 219* 210* 197*  ALKPHOS 47 49 56 55 53  BILITOT 0.6 0.4 0.5 0.5 0.6  PROT 5.7* 5.7* 5.7* 5.6* 5.5*  ALBUMIN 2.7* 2.7* 2.7* 2.6* 2.6*   No results for input(s): "LIPASE", "AMYLASE" in the last 168 hours. No results for input(s): "AMMONIA" in the last 168 hours.  Coagulation Profile: Recent Labs  Lab 11/06/23 2138  INR 1.1    Cardiac Enzymes: Recent Labs  Lab 11/08/23 0629 11/09/23 0427 11/10/23 0422 11/11/23 0420 11/12/23 0514  CKTOTAL 15,852* 14,591* 12,237* 9,669* 8,045*    BNP (last 3 results) No results for input(s): "PROBNP" in the last 8760  hours.  Lipid Profile: No results for input(s): "CHOL", "HDL", "LDLCALC", "TRIG", "CHOLHDL", "LDLDIRECT" in the last 72 hours.  Thyroid Function Tests: No results for input(s): "TSH", "T4TOTAL", "FREET4", "T3FREE", "THYROIDAB" in the last 72 hours.  Anemia Panel: No results for input(s): "VITAMINB12", "FOLATE", "FERRITIN", "TIBC", "IRON", "RETICCTPCT" in the last 72 hours.   Urine analysis:    Component Value Date/Time   COLORURINE YELLOW 11/06/2023 1655   APPEARANCEUR HAZY (A) 11/06/2023 1655   LABSPEC 1.008 11/06/2023 1655   PHURINE 6.0 11/06/2023 1655   GLUCOSEU >=500 (A) 11/06/2023 1655   HGBUR LARGE (A) 11/06/2023 1655   BILIRUBINUR NEGATIVE 11/06/2023 1655  KETONESUR NEGATIVE 11/06/2023 1655   PROTEINUR 100 (A) 11/06/2023 1655   UROBILINOGEN 0.2 02/22/2014 1730   NITRITE NEGATIVE 11/06/2023 1655   LEUKOCYTESUR NEGATIVE 11/06/2023 1655    Sepsis Labs: Lactic Acid, Venous No results found for: "LATICACIDVEN"  MICROBIOLOGY: No results found for this or any previous visit (from the past 240 hours).  RADIOLOGY STUDIES/RESULTS: No results found.   LOS: 5 days   Jeoffrey Massed, MD  [facility]    To contact the attending provider between 7A-7P or the covering provider during after hours 7P-7A, please log into the web site [email or web address] and access using universal [place] password for that web site. If you do not have the password, please call the hospital operator.  11/12/2023, 12:26 PM

## 2023-11-12 NOTE — Plan of Care (Signed)

## 2023-11-13 ENCOUNTER — Telehealth: Payer: Self-pay

## 2023-11-13 DIAGNOSIS — I639 Cerebral infarction, unspecified: Secondary | ICD-10-CM | POA: Diagnosis not present

## 2023-11-13 DIAGNOSIS — I10 Essential (primary) hypertension: Secondary | ICD-10-CM

## 2023-11-13 DIAGNOSIS — N184 Chronic kidney disease, stage 4 (severe): Secondary | ICD-10-CM | POA: Diagnosis not present

## 2023-11-13 DIAGNOSIS — N179 Acute kidney failure, unspecified: Secondary | ICD-10-CM | POA: Diagnosis not present

## 2023-11-13 LAB — CK: Total CK: 4960 U/L — ABNORMAL HIGH (ref 38–234)

## 2023-11-13 LAB — COMPREHENSIVE METABOLIC PANEL
ALT: 191 U/L — ABNORMAL HIGH (ref 0–44)
AST: 149 U/L — ABNORMAL HIGH (ref 15–41)
Albumin: 2.7 g/dL — ABNORMAL LOW (ref 3.5–5.0)
Alkaline Phosphatase: 52 U/L (ref 38–126)
Anion gap: 12 (ref 5–15)
BUN: 37 mg/dL — ABNORMAL HIGH (ref 6–20)
CO2: 22 mmol/L (ref 22–32)
Calcium: 8.9 mg/dL (ref 8.9–10.3)
Chloride: 104 mmol/L (ref 98–111)
Creatinine, Ser: 3.75 mg/dL — ABNORMAL HIGH (ref 0.44–1.00)
GFR, Estimated: 15 mL/min — ABNORMAL LOW (ref >60)
Glucose, Bld: 131 mg/dL — ABNORMAL HIGH (ref 70–99)
Potassium: 4.2 mmol/L (ref 3.5–5.1)
Sodium: 138 mmol/L (ref 135–145)
Total Bilirubin: 0.6 mg/dL (ref 0.0–1.2)
Total Protein: 6 g/dL — ABNORMAL LOW (ref 6.5–8.1)

## 2023-11-13 LAB — GLUCOSE, CAPILLARY
Glucose-Capillary: 118 mg/dL — ABNORMAL HIGH (ref 70–99)
Glucose-Capillary: 176 mg/dL — ABNORMAL HIGH (ref 70–99)
Glucose-Capillary: 154 mg/dL — ABNORMAL HIGH (ref 70–99)
Glucose-Capillary: 217 mg/dL — ABNORMAL HIGH (ref 70–99)

## 2023-11-13 LAB — INHIBIN B: Inhibin B: 55.8 pg/mL

## 2023-11-13 NOTE — TOC Progression Note (Signed)
Transition of Care [facility]) - Progression Note    Patient Details  Name: Ebony Matthews MRN: [id number] Date of Birth: 1981-01-06  Transition of Care [facility]) CM/SW Contact  Mearl Latin, LCSW Phone Number: 11/13/2023, 3:36 PM  Clinical Narrative:    CSW provided update to [facility].    Expected Discharge Plan: Skilled Nursing Facility Barriers to Discharge: Continued Medical Work up, English as a second language teacher  Expected Discharge Plan and Services In-house Referral: Clinical Social Work   Post Acute Care Choice: Skilled Nursing Facility Living arrangements for the past 2 months: Apartment                                       Social Determinants of Health (SDOH) Interventions SDOH Screenings   Food Insecurity: No Food Insecurity (11/06/2023)  Housing: Unknown (11/06/2023)  Transportation Needs: No Transportation Needs (11/06/2023)  Utilities: Not At Risk (11/06/2023)  Depression (PHQ2-9): Low Risk  (07/17/2020)  Tobacco Use: Medium Risk (11/01/2023)    Readmission Risk Interventions     No data to display

## 2023-11-13 NOTE — Plan of Care (Signed)
  Problem: Education: Goal: Ability to describe self-care measures that may prevent or decrease complications (Diabetes Survival Skills Education) will improve Outcome: Progressing   Problem: Coping: Goal: Ability to adjust to condition or change in health will improve Outcome: Progressing   Problem: Health Behavior/Discharge Planning: Goal: Ability to manage health-related needs will improve Outcome: Progressing   Problem: Skin Integrity: Goal: Risk for impaired skin integrity will decrease Outcome: Progressing   Problem: Education: Goal: Knowledge of General Education information will improve Description: Including pain rating scale, medication(s)/side effects and non-pharmacologic comfort measures Outcome: Progressing   Problem: Clinical Measurements: Goal: Diagnostic test results will improve Outcome: Progressing   Problem: Activity: Goal: Risk for activity intolerance will decrease Outcome: Progressing   Problem: Elimination: Goal: Will not experience complications related to bowel motility Outcome: Progressing   Problem: Pain Managment: Goal: General experience of comfort will improve and/or be controlled Outcome: Progressing

## 2023-11-13 NOTE — Plan of Care (Signed)

## 2023-11-13 NOTE — Telephone Encounter (Signed)
Spoke with patient and asked her about scheduling a new patient visit for this patient.  She is currently admitted to hospital and doesn't know when she will be discharged.  I tried to setup appointment for 3/31 with Dr Alvester Morin and she said she would need a ride from her aunt and she will call back to let us know if she can come that day.

## 2023-11-13 NOTE — Progress Notes (Signed)
Physical Therapy Treatment Patient Details Name: Ebony Matthews MRN: [id number] DOB: Jun 09, 1981 Today's Date: 11/13/2023   History of Present Illness 43 y.o. female presents to [facility] hospital on 11/06/2023 with new R weakness. CT abdomen with finding of a large cystic mass in the pelvis. PMH includes DMII, CVA w/ L hemiplegia, HTN, cocaine use.    PT Comments  Patient is agreeable to PT session. She reports sitting up in chair for 2 hours yesterday. Today the patient continues to need assistance for bed mobility. Lateral scooting transfers performed with total assistance despite cues for technique. Poor dynamic sitting balance with loss of balance. Heart rate up to 121bpm with exertional activity. Continue to recommend rehabilitation < 3 hours/day after this hospital stay.    If plan is discharge home, recommend the following: Two people to help with walking and/or transfers;Two people to help with bathing/dressing/bathroom;Assistance with cooking/housework;Direct supervision/assist for medications management;Direct supervision/assist for financial management;Assist for transportation;Help with stairs or ramp for entrance   Can travel by private vehicle     No  Equipment Recommendations  Wheelchair (measurements PT);Hoyer lift;Hospital bed    Recommendations for Other Services       Precautions / Restrictions Precautions Precautions: Fall Restrictions Weight Bearing Restrictions Per Provider Order: No     Mobility  Bed Mobility Overal bed mobility: Needs Assistance Bed Mobility: Supine to Sit, Sit to Supine     Supine to sit: Max assist Sit to supine: Max assist   General bed mobility comments: assistance for BLE and trunk support. cues for sequencing    Transfers Overall transfer level: Needs assistance   Transfers: Bed to chair/wheelchair/BSC            Lateral/Scoot Transfers: Total assist General transfer comment: total assistance despite cues for technique for  lateral scoot transfer. loss of balance with dynamic sitting balance. standing not attempted, recommend + 2 person assistance for safey. patient still unable to flex R knee to 90 degrees due to pain    Ambulation/Gait                   Stairs             Wheelchair Mobility     Tilt Bed    Modified Rankin (Stroke Patients Only)       Balance Overall balance assessment: Needs assistance Sitting-balance support: Feet supported, Single extremity supported Sitting balance-Leahy Scale: Poor Sitting balance - Comments: fair static sitting, poor dynamic sitting balance with loss of balance to right                                    Communication Communication Communication: No apparent difficulties  Cognition Arousal: Alert Behavior During Therapy: Flat affect   PT - Cognitive impairments: No apparent impairments                         Following commands: Intact      Cueing Cueing Techniques: Verbal cues  Exercises      General Comments General comments (skin integrity, edema, etc.): hear rate up to 121 bpm with activity      Pertinent Vitals/Pain Pain Assessment Pain Assessment: Faces Faces Pain Scale: Hurts little more Pain Location: distal right thigh with knee flexion Pain Descriptors / Indicators: Discomfort Pain Intervention(s): Monitored during session, Limited activity within patient's tolerance, Repositioned    Home Living  Prior Function            PT Goals (current goals can now be found in the care plan section) Acute Rehab PT Goals Patient Stated Goal: to go to rehab PT Goal Formulation: With patient Time For Goal Achievement: 11/21/23 Potential to Achieve Goals: Fair Progress towards PT goals: Progressing toward goals    Frequency    Min 1X/week      PT Plan      Co-evaluation              AM-PAC PT "6 Clicks" Mobility   Outcome Measure  Help  needed turning from your back to your side while in a flat bed without using bedrails?: A Lot Help needed moving from lying on your back to sitting on the side of a flat bed without using bedrails?: A Lot Help needed moving to and from a bed to a chair (including a wheelchair)?: Total Help needed standing up from a chair using your arms (e.g., wheelchair or bedside chair)?: Total Help needed to walk in hospital room?: Total Help needed climbing 3-5 steps with a railing? : Total 6 Click Score: 8    End of Session   Activity Tolerance: Patient tolerated treatment well Patient left: in bed;with call bell/phone within reach;with bed alarm set Nurse Communication: Mobility status PT Visit Diagnosis: Other abnormalities of gait and mobility (R26.89);Muscle weakness (generalized) (M62.81);Other symptoms and signs involving the nervous system (R29.898)     Time: 0981-1914 PT Time Calculation (min) (ACUTE ONLY): 34 min  Charges:    $Therapeutic Activity: 23-37 mins PT General Charges $$ ACUTE PT VISIT: 1 Visit                     Donna Bernard, PT, MPT    Ina Homes 11/13/2023, 2:03 PM

## 2023-11-13 NOTE — Progress Notes (Signed)
PROGRESS NOTE        PATIENT DETAILS Name: Ebony Matthews Age: 43 y.o. Sex: female Date of Birth: 03/13/1981 Admit Date: 11/06/2023 Admitting Physician Orland Mustard, MD ZOX:[id number], Melanee Spry, MD  Brief Summary: Patient is a 43 y.o.  female with hx of CVA w left sided weakness, HTN, CKD 4-presented with fall-slid from bed to floor-found to have AKI with rhabdomyolysis  Significant events: 2/7>>admit to [facility]  Significant studies: 2/7>>MRI Brain: no acute intracranial abnormality 2/7>>MRI LS spine:L4-L5 broad based disc protusion 2/7>>Renal Ultrasound:no hydronephroses  Significant microbiology data: None  Procedures: None  Consults: Nephrology GYN  Subjective: No complaints-lying comfortably in bed.  Objective: Vitals: Blood pressure 117/78, pulse 86, temperature 98.1 F (36.7 C), temperature source Oral, resp. rate 19, height 5\' 7"  (1.702 m), weight 93.4 kg, last menstrual period 10/03/2023, SpO2 99%.   Exam: Gen Exam:Alert awake-not in any distress HEENT:atraumatic, normocephalic Chest: B/L clear to auscultation anteriorly CVS:S1S2 regular Abdomen:soft non tender, non distended Extremities:no edema Neurology: Left-sided hemiparesis Skin: no rash  Pertinent Labs/Radiology:    Latest Ref Rng & Units 11/12/2023    5:14 AM 11/11/2023    4:20 AM 11/10/2023    4:22 AM  CBC  WBC 4.0 - 10.5 K/uL 6.1  6.8  7.5   Hemoglobin 12.0 - 15.0 g/dL 9.4  9.7  45.4   Hematocrit 36.0 - 46.0 % 29.9  30.5  34.3   Platelets 150 - 400 K/uL 301  287  277     Lab Results  Component Value Date   NA 138 11/13/2023   K 4.2 11/13/2023   CL 104 11/13/2023   CO2 22 11/13/2023      Assessment/Plan: AKI on CKD 4 Multifactorial-secondary to rhabdomyolysis-and hemodynamically mediated kidney injury Creatinine slowly improving with supportive care Continue to avoid nephrotoxic agent Follow electrolytes Nephrology following.    Rhabdomyolysis Secondary  to being on the floor for prolonged time CK continues to slowly improve Continue to trend CK.  Right-sided weakness Secondary to rhabdomyolysis-CVA ruled out-MRI negative. Continue to treat underlying rhabdomyolysis Mobilize with PT/OT.  Transaminitis Suspect this is from rhabdomyolysis LFTs continue to downtrend as rhabdomyolysis improves. Acute hepatitis serology negative  HTN BP stable-all antihypertensives remain on hold  DM-2 (A1c 6.4 on 2/7) CBG stable-continue SSI.  Recent Labs    11/12/23 2126 11/13/23 0855 11/13/23 1142  GLUCAP 177* 118* 176*     CVA with left hemiparesis Continue aspirin/Plavix Hold statin until CK improves further.  Pelvic mass Incidental finding on imaging studies Appreciate GYN input-outpatient referral sent for ongoing GYN.  Class 1 Obesity: Estimated body mass index is 32.25 kg/m as calculated from the following:   Height as of this encounter: 5\' 7"  (1.702 m).   Weight as of this encounter: 93.4 kg.   Code status:   Code Status: Full Code   DVT Prophylaxis: heparin injection 5,000 Units Start: 11/07/23 1700 SCDs Start: 11/06/23 1722 Place TED hose Start: 11/06/23 1722   Family Communication: None at bedside   Disposition Plan: Status is: Inpatient Remains inpatient appropriate because: Severity of illness   Planned Discharge Destination: SNF in the next 1-2 days-once AKI/rhabdomyolysis improves further.   Diet: Diet Order             Diet heart healthy/carb modified Fluid consistency: Thin  Diet effective now  Antimicrobial agents: Anti-infectives (From admission, onward)    None        MEDICATIONS: Scheduled Meds:  aspirin EC  81 mg Oral Daily   clopidogrel  75 mg Oral Daily   cyanocobalamin  1,000 mcg Subcutaneous Daily   Followed by   Melene Muller ON 11/16/2023] cyanocobalamin  1,000 mcg Subcutaneous Q30 days   diclofenac Sodium  2 g Topical TID AC & HS   folic acid  1 mg Oral  Daily   heparin injection (subcutaneous)  5,000 Units Subcutaneous Q8H   insulin aspart  0-9 Units Subcutaneous TID WC   lidocaine  1 patch Transdermal Q24H   Continuous Infusions:   PRN Meds:.cyclobenzaprine, ondansetron **OR** ondansetron (ZOFRAN) IV, senna   I have personally reviewed following labs and imaging studies  LABORATORY DATA: CBC: Recent Labs  Lab 11/06/23 1332 11/06/23 1338 11/08/23 0629 11/09/23 0427 11/10/23 0422 11/11/23 0420 11/12/23 0514  WBC 6.0   < > 6.8 7.0 7.5 6.8 6.1  NEUTROABS 3.8  --   --   --   --   --   --   HGB 11.6*   < > 10.0* 10.3* 10.9* 9.7* 9.4*  HCT 37.5   < > 31.3* 32.4* 34.3* 30.5* 29.9*  MCV 86.2   < > 83.0 84.2 84.1 83.8 85.4  PLT PLATELET CLUMPS NOTED ON SMEAR, UNABLE TO ESTIMATE   < > 289 269 277 287 301   < > = values in this interval not displayed.    Basic Metabolic Panel: Recent Labs  Lab 11/09/23 0427 11/10/23 0422 11/11/23 0420 11/12/23 0514 11/13/23 0645  NA 142 137 136 137 138  K 3.7 4.1 3.7 3.9 4.2  CL 106 108 105 107 104  CO2 18* 18* 19* 17* 22  GLUCOSE 124* 131* 127* 134* 131*  BUN 42* 44* 44* 41* 37*  CREATININE 4.36* 4.27* 4.02* 3.77* 3.75*  CALCIUM 9.2 8.8* 8.5* 8.3* 8.9    GFR: Estimated Creatinine Clearance: 22.5 mL/min (A) (by C-G formula based on SCr of 3.75 mg/dL (H)).  Liver Function Tests: Recent Labs  Lab 11/09/23 0427 11/10/23 0422 11/11/23 0420 11/12/23 0514 11/13/23 0645  AST 221* 227* 202* 176* 149*  ALT 211* 219* 210* 197* 191*  ALKPHOS 49 56 55 53 52  BILITOT 0.4 0.5 0.5 0.6 0.6  PROT 5.7* 5.7* 5.6* 5.5* 6.0*  ALBUMIN 2.7* 2.7* 2.6* 2.6* 2.7*   No results for input(s): "LIPASE", "AMYLASE" in the last 168 hours. No results for input(s): "AMMONIA" in the last 168 hours.  Coagulation Profile: Recent Labs  Lab 11/06/23 2138  INR 1.1    Cardiac Enzymes: Recent Labs  Lab 11/09/23 0427 11/10/23 0422 11/11/23 0420 11/12/23 0514 11/13/23 0645  CKTOTAL 14,591* 12,237*  9,669* 8,045* 4,960*    BNP (last 3 results) No results for input(s): "PROBNP" in the last 8760 hours.  Lipid Profile: No results for input(s): "CHOL", "HDL", "LDLCALC", "TRIG", "CHOLHDL", "LDLDIRECT" in the last 72 hours.  Thyroid Function Tests: No results for input(s): "TSH", "T4TOTAL", "FREET4", "T3FREE", "THYROIDAB" in the last 72 hours.  Anemia Panel: No results for input(s): "VITAMINB12", "FOLATE", "FERRITIN", "TIBC", "IRON", "RETICCTPCT" in the last 72 hours.   Urine analysis:    Component Value Date/Time   COLORURINE YELLOW 11/06/2023 1655   APPEARANCEUR HAZY (A) 11/06/2023 1655   LABSPEC 1.008 11/06/2023 1655   PHURINE 6.0 11/06/2023 1655   GLUCOSEU >=500 (A) 11/06/2023 1655   HGBUR LARGE (A) 11/06/2023 1655   BILIRUBINUR NEGATIVE 11/06/2023 1655  KETONESUR NEGATIVE 11/06/2023 1655   PROTEINUR 100 (A) 11/06/2023 1655   UROBILINOGEN 0.2 02/22/2014 1730   NITRITE NEGATIVE 11/06/2023 1655   LEUKOCYTESUR NEGATIVE 11/06/2023 1655    Sepsis Labs: Lactic Acid, Venous No results found for: "LATICACIDVEN"  MICROBIOLOGY: No results found for this or any previous visit (from the past 240 hours).  RADIOLOGY STUDIES/RESULTS: No results found.   LOS: 6 days   Jeoffrey Massed, MD  [facility]    To contact the attending provider between 7A-7P or the covering provider during after hours 7P-7A, please log into the web site [email or web address] and access using universal [place] password for that web site. If you do not have the password, please call the hospital operator.  11/13/2023, 12:15 PM

## 2023-11-13 NOTE — Telephone Encounter (Signed)
Spoke with patient who is currently admitted at [facility].  Said she would like to wait til after discharge because she is planning to go to short term rehab facility.

## 2023-11-14 DIAGNOSIS — R7401 Elevation of levels of liver transaminase levels: Secondary | ICD-10-CM | POA: Diagnosis not present

## 2023-11-14 DIAGNOSIS — E1165 Type 2 diabetes mellitus with hyperglycemia: Secondary | ICD-10-CM

## 2023-11-14 DIAGNOSIS — E872 Acidosis, unspecified: Secondary | ICD-10-CM | POA: Diagnosis not present

## 2023-11-14 DIAGNOSIS — I639 Cerebral infarction, unspecified: Secondary | ICD-10-CM | POA: Diagnosis not present

## 2023-11-14 LAB — COMPREHENSIVE METABOLIC PANEL
ALT: 171 U/L — ABNORMAL HIGH (ref 0–44)
AST: 109 U/L — ABNORMAL HIGH (ref 15–41)
Albumin: 2.8 g/dL — ABNORMAL LOW (ref 3.5–5.0)
Alkaline Phosphatase: 50 U/L (ref 38–126)
Anion gap: 11 (ref 5–15)
BUN: 40 mg/dL — ABNORMAL HIGH (ref 6–20)
CO2: 23 mmol/L (ref 22–32)
Calcium: 8.7 mg/dL — ABNORMAL LOW (ref 8.9–10.3)
Chloride: 102 mmol/L (ref 98–111)
Creatinine, Ser: 3.79 mg/dL — ABNORMAL HIGH (ref 0.44–1.00)
GFR, Estimated: 14 mL/min — ABNORMAL LOW (ref >60)
Glucose, Bld: 134 mg/dL — ABNORMAL HIGH (ref 70–99)
Potassium: 3.9 mmol/L (ref 3.5–5.1)
Sodium: 136 mmol/L (ref 135–145)
Total Bilirubin: 0.6 mg/dL (ref 0.0–1.2)
Total Protein: 6 g/dL — ABNORMAL LOW (ref 6.5–8.1)

## 2023-11-14 LAB — CK: Total CK: 3737 U/L — ABNORMAL HIGH (ref 38–234)

## 2023-11-14 LAB — GLUCOSE, CAPILLARY
Glucose-Capillary: 116 mg/dL — ABNORMAL HIGH (ref 70–99)
Glucose-Capillary: 161 mg/dL — ABNORMAL HIGH (ref 70–99)

## 2023-11-14 MED ORDER — NOVOLOG FLEXPEN 100 UNIT/ML ~~LOC~~ SOPN
PEN_INJECTOR | SUBCUTANEOUS | Status: DC
Start: 2023-11-14 — End: 2024-01-27

## 2023-11-14 MED ORDER — FOLIC ACID 1 MG PO TABS: 1.0000 mg | ORAL_TABLET | Freq: Every day | ORAL | Status: AC

## 2023-11-14 MED ORDER — CYANOCOBALAMIN 1000 MCG/ML IJ SOLN: INTRAMUSCULAR | Status: DC

## 2023-11-14 NOTE — TOC Progression Note (Signed)
Transition of Care [facility]) - Progression Note    Patient Details  Name: Ebony Matthews MRN: [id number] Date of Birth: 07-09-1981  Transition of Care [facility]) CM/SW Contact  [address] [place], [place] Phone Number: 11/14/2023, 9:16 AM  Clinical Narrative:    Authorization received for patient to discharge to [facility]. Patient authorized form 11/13/23-11/15/23 ref# [id number] Plan ID  [id number]   VM left with Kia to confirm discharge for today. Discharge summary faxed to [facility].  Shanty Ginty, LCSW Transition of Care     Expected Discharge Plan: Skilled Nursing Facility Barriers to Discharge: Continued Medical Work up, English as a second language teacher  Expected Discharge Plan and Services In-house Referral: Clinical Social Work   Post Acute Care Choice: Skilled Nursing Facility Living arrangements for the past 2 months: Apartment Expected Discharge Date: 11/14/23                                     Social Determinants of Health (SDOH) Interventions SDOH Screenings   Food Insecurity: No Food Insecurity (11/06/2023)  Housing: Unknown (11/06/2023)  Transportation Needs: No Transportation Needs (11/06/2023)  Utilities: Not At Risk (11/06/2023)  Depression (PHQ2-9): Low Risk  (07/17/2020)  Tobacco Use: Medium Risk (11/01/2023)    Readmission Risk Interventions     No data to display

## 2023-11-14 NOTE — Discharge Summary (Signed)
PATIENT DETAILS Name: Ebony Matthews Age: 43 y.o. Sex: female Date of Birth: 1981/03/02 MRN: [id number]. Admitting Physician: Orland Mustard, MD NWG:NFAO, Melanee Spry, MD  Admit Date: 11/06/2023 Discharge date: 11/14/2023  Recommendations for Outpatient Follow-up:  Follow up with PCP in 1-2 weeks Please obtain CMP/CBC in one week Please repeat creatinine kinase in 1 week Please ensure follow-up with GYN oncology Please ensure follow-up with nephrology All antihypertensives remain on hold as blood pressure is reasonably stable-please reassess and resume accordingly. Statin continues to be held-as CK levels continue to come down-this should be resumed.  As noted above-recheck CK in 1 week.  Admitted From:  Home  Disposition: Skilled nursing facility   Discharge Condition: good  CODE STATUS:   Code Status: Full Code   Diet recommendation:  Diet Order             Diet - low sodium heart healthy           Diet Carb Modified           Diet heart healthy/carb modified Fluid consistency: Thin  Diet effective now                    Brief Summary: Patient is a 43 y.o.  female with hx of CVA w left sided weakness, HTN, CKD 4-presented with fall-slid from bed to floor-found to have AKI with rhabdomyolysis   Significant events: 2/7>>admit to [facility]   Significant studies: 2/7>>MRI Brain: no acute intracranial abnormality 2/7>>MRI LS spine:L4-L5 broad based disc protusion 2/7>>Renal Ultrasound:no hydronephroses   Significant microbiology data: None   Procedures: None   Consults: Nephrology GYN  Brief Hospital Course: AKI on CKD 4 Multifactorial-secondary to rhabdomyolysis-and hemodynamically mediated kidney injury Creatinine improved with supportive care-but has plateaued around 3.7 (was 2.08 May 2023-suspected CKD from HTN/DM at baseline)  She is euvolemic-otherwise stable Discussed with nephrologist-Dr. Signe Colt on 2/15-her office will arrange follow-up-as  patient appears to have significant CKD at baseline. Please continue to avoid nephrotoxic agents Please recheck electrolytes in 1 week Please ensure follow-up with nephrology postdischarge.    Rhabdomyolysis Secondary to being on the floor for prolonged time CK significantly better-downtrending on its own-given IV fluids during the first few days of hospitalization Please repeat CK in 1 week.   Right-sided weakness Secondary to rhabdomyolysis-CVA ruled out-MRI negative. Continue to treat underlying rhabdomyolysis Mobilize with PT/OT.   Transaminitis Suspect this is from rhabdomyolysis LFTs continue to downtrend as rhabdomyolysis improves. Acute hepatitis serology negative   HTN BP stable-all antihypertensives remain on hold   DM-2 (A1c 6.4 on 2/7) CBG stable-continue SSI. Continue to hold metformin/Jardiance for now.  CVA with left hemiparesis Continue aspirin/Plavix Hold statin until CK improves further.   Pelvic mass Incidental finding on imaging studies Appreciate GYN input-outpatient referral sent for ONC GYN.  Please ensure outpatient follow-up with oncology GYN.   Vitamin B12 deficiency Continue supplementation-please recheck B12 levels in 6 weeks.  Class 1 Obesity: Estimated body mass index is 32.25 kg/m as calculated from the following:   Height as of this encounter: 5\' 7"  (1.702 m).   Weight as of this encounter: 93.4 kg.    Discharge Diagnoses:  Principal Problem:   Acute renal failure superimposed on stage 4 chronic kidney disease (HCC) Active Problems:   Pelvic mass   Right sided weakness   Transaminitis   Metabolic acidosis   Uncontrolled type 2 diabetes mellitus with hyperglycemia, without long-term current use of insulin (HCC)   Essential hypertension,  benign   History of CVA with left sided deficits   AKI (acute kidney injury) [facility])   Discharge Instructions:  Activity:  As tolerated with Full fall precautions use walker/cane & assistance  as needed  Discharge Instructions     Call MD for:  difficulty breathing, headache or visual disturbances   Complete by: As directed    Call MD for:  persistant dizziness or light-headedness   Complete by: As directed    Diet - low sodium heart healthy   Complete by: As directed    Diet Carb Modified   Complete by: As directed    Discharge instructions   Complete by: As directed    Follow with Primary MD  Ellyn Hack, MD in 1-2 weeks  Please follow-up with neurology clinic-their office will give you a call  Please follow-up with GYN oncology-their office will give you a call Please get a complete blood count and chemistry panel checked by your Primary MD at your next visit, and again as instructed by your Primary MD.  Get Medicines reviewed and adjusted: Please take all your medications with you for your next visit with your Primary MD  Laboratory/radiological data: Please request your Primary MD to go over all hospital tests and procedure/radiological results at the follow up, please ask your Primary MD to get all Hospital records sent to his/her office.  In some cases, they will be blood work, cultures and biopsy results pending at the time of your discharge. Please request that your primary care M.D. follows up on these results.  Also Note the following: If you experience worsening of your admission symptoms, develop shortness of breath, life threatening emergency, suicidal or homicidal thoughts you must seek medical attention immediately by calling 911 or calling your MD immediately  if symptoms less severe.  You must read complete instructions/literature along with all the possible adverse reactions/side effects for all the Medicines you take and that have been prescribed to you. Take any new Medicines after you have completely understood and accpet all the possible adverse reactions/side effects.   Do not drive when taking Pain medications or sleeping medications  (Benzodaizepines)  Do not take more than prescribed Pain, Sleep and Anxiety Medications. It is not advisable to combine anxiety,sleep and pain medications without talking with your primary care practitioner  Special Instructions: If you have smoked or chewed Tobacco  in the last 2 yrs please stop smoking, stop any regular Alcohol  and or any Recreational drug use.  Wear Seat belts while driving.  Please note: You were cared for by a hospitalist during your hospital stay. Once you are discharged, your primary care physician will handle any further medical issues. Please note that NO REFILLS for any discharge medications will be authorized once you are discharged, as it is imperative that you return to your primary care physician (or establish a relationship with a primary care physician if you do not have one) for your post hospital discharge needs so that they can reassess your need for medications and monitor your lab values.   Please check CBGs before meals and at bedtime.   Increase activity slowly   Complete by: As directed       Allergies as of 11/14/2023   No Known Allergies      Medication List     STOP taking these medications    cephALEXin 500 MG capsule Commonly known as: KEFLEX   fenofibrate 145 MG tablet Commonly known as: [employer]  Jardiance 10 MG Tabs tablet Generic drug: empagliflozin   Lantus SoloStar 100 UNIT/ML Solostar Pen Generic drug: insulin glargine   metFORMIN 1000 MG tablet Commonly known as: GLUCOPHAGE   metoprolol succinate 25 MG 24 hr tablet Commonly known as: TOPROL-XL   rosuvastatin 40 MG tablet Commonly known as: CRESTOR       TAKE these medications    aspirin EC 81 MG tablet Take 81 mg by mouth daily. Swallow whole.   BD Pen Needle Micro U/F 32G X 6 MM Misc Generic drug: Insulin Pen Needle Inject into the skin as directed.   clopidogrel 75 MG tablet Commonly known as: PLAVIX Take 1 tablet (75 mg total) by mouth daily.    cyanocobalamin 1000 MCG/ML injection Commonly known as: VITAMIN B12 1000 mcg SQ weekly x 4 weeks, then 1000 mcg SQ monthly   folic acid 1 MG tablet Commonly known as: FOLVITE Take 1 tablet (1 mg total) by mouth daily.   nitroGLYCERIN 0.4 MG SL tablet Commonly known as: Nitrostat Place 1 tablet (0.4 mg total) under the tongue every 5 (five) minutes as needed for chest pain. If you require more than two tablets five minutes apart go to the nearest ER via EMS.   NovoLOG FlexPen 100 UNIT/ML FlexPen Generic drug: insulin aspart 0-9 Units, Subcutaneous, 3 times daily with meals CBG < 70: Implement Hypoglycemia measures CBG 70 - 120: 0 units CBG 121 - 150: 1 unit CBG 151 - 200: 2 units CBG 201 - 250: 3 units CBG 251 - 300: 5 units CBG 301 - 350: 7 units CBG 351 - 400: 9 units CBG > 400: call MD        Contact information for follow-up providers     Ellyn Hack, MD. Schedule an appointment as soon as possible for a visit in 1 week(s).   Specialty: Family Medicine Contact information: [address] [place] [address] [phone]         Bufford Buttner, MD Follow up.   Specialty: Nephrology Why: Office will call with date/time, If you dont hear from them,please give them a call Contact information: [address] [place] [address] [phone]         [facility] - A Dept Of [place] Follow up.   Specialty: Gynecologic Oncology Why: Office will call with date/time, If you dont hear from them,please give them a call Contact information: [address] [place] [address] [phone]             Contact information for after-discharge care     Destination     [facility] Preferred SNF .   Service: Skilled Nursing Contact information: [address] [place] [address] [phone]                    No Known Allergies   Other Procedures/Studies: MR  PELVIS WO CONTRAST Result Date: 11/09/2023 CLINICAL DATA:  Adnexal mass EXAM: MRI PELVIS WITHOUT CONTRAST TECHNIQUE: Multiplanar multisequence MR imaging of the pelvis was performed. No intravenous contrast was administered. COMPARISON:  CT scan 11/06/2023 and older going back to May 19, 2023. Remote ultrasound of the pelvis report from 2011 December FINDINGS: Urinary Tract: Preserved contour to the urinary bladder. Grossly normal course and caliber to the urethra. Bowel: Visualized bowel in the pelvis is nondilated. This includes small and large bowel. Vascular/Lymphatic: Normal caliber aorta and iliac vessels. There is some plaque seen along  the left common femoral artery at the edge of the imaging field. No discrete abnormal lymph node enlargement identified in the pelvis. There are several small less than 1 cm in size iliac chain nodes, not pathologic by size criteria. Reproductive: Uterus measures 7.7 x 4.5 by 4.9 cm. Endometrial stripe of 9 mm. Junctional zone close to 8 mm. No cystic areas along the junctional zone. No myometrial lesion. Left ovary has follicles measuring 2.4 x 3.9 cm by 2.8 cm. Largest dominant follicle or small cyst measures 19 x 14 by 18 mm. The right ovary is not seen as a discrete separate structure. There is a very large cystic mass identified in the anterior central pelvis. This complex lesion has dimensions today approaching 18.0 by 10.6 by 22.3 cm. On the CT scan of August 2024 lesion would have measured 20.3 x 17.9 x 11.2 cm. There are some nodular areas along the anterior aspect of this lesion. This was seen before with component of calcification. Example nodular area that is noncalcified measures 4.5 by 1.8 cm in the axial plane on series 6, image 25. This is not as well characterized without the advantage of IV contrast but this area of nodularity in the lesion does show some restricted diffusion. There is some additional small frondlike areas identified elsewhere along the  anterior aspect of the cystic lesion including along left side on series 6, image 30. Due to its size is difficult to ascertain the exact origin of this mass but this very well could be an ovarian lesion and a malignant process is possible. Other:  Small amount of free fluid in the pelvis. Musculoskeletal: No suspicious bone lesions identified.  Anasarca. Evaluation limited by motion. IMPRESSION: Very large cystic lesion identified in the central pelvis with some nodular potential soft tissue elements. Small amount of free fluid the pelvis. Based on the overall appearance this very well could be an ovarian lesion and a cystic neoplasm of the ovary is possible including an aggressive or malignant process. The right ovary is not seen as a separate structure. Recommend surgical consultation. In principle with a size this lesion is difficult to ascertain the exact organ of origin as this does abut several structures. In addition there is some potential soft tissue elements, and if needed additional evaluation with a postcontrast set of images can be obtained. Motion Electronically Signed   By: Karen Kays M.D.   On: 11/09/2023 12:57   CT ABDOMEN PELVIS WO CONTRAST Result Date: 11/06/2023 CLINICAL DATA:  Adnexal mass, malignancy suspected EXAM: CT ABDOMEN AND PELVIS WITHOUT CONTRAST TECHNIQUE: Multidetector CT imaging of the abdomen and pelvis was performed following the standard protocol without IV contrast. RADIATION DOSE REDUCTION: This exam was performed according to the departmental dose-optimization program which includes automated exposure control, adjustment of the mA and/or kV according to patient size and/or use of iterative reconstruction technique. COMPARISON:  CT renal 05/19/2023 FINDINGS: Lower chest: No acute abnormality. Hepatobiliary: No focal liver abnormality. No gallstones, gallbladder wall thickening, or pericholecystic fluid. No biliary dilatation. Pancreas: No focal lesion. Normal pancreatic  contour. No surrounding inflammatory changes. No main pancreatic ductal dilatation. Spleen: Normal in size without focal abnormality. Adrenals/Urinary Tract: No adrenal nodule bilaterally. No nephrolithiasis and no hydronephrosis. No definite contour-deforming renal mass. No ureterolithiasis or hydroureter. The urinary bladder is unremarkable. Stomach/Bowel: Stomach is within normal limits. No evidence of bowel wall thickening or dilatation. Appendix appears normal. Vascular/Lymphatic: No abdominal aorta or iliac aneurysm. Moderate to severe atherosclerotic plaque of the  aorta and its branches. No abdominal, pelvic, or inguinal lymphadenopathy. Reproductive: Uterus is unremarkable. Similar-appearing 18 x 12 x22 cm cystic pelvic lesion with associated calcification (3:71). This mass abuts the uterus as well as the sigmoid colon and several loops of small bowel. Other: No intraperitoneal free fluid. No intraperitoneal free gas. No organized fluid collection. Musculoskeletal: No abdominal wall hernia or abnormality. No suspicious lytic or blastic osseous lesions. No acute displaced fracture. Multilevel degenerative changes of the spine. IMPRESSION: 1. Similar-appearing complex 22 cm cystic pelvic lesion. Malignancy is not excluded. Recommend gynecologic consultation and MRI pelvis with and without contrast (including the mid abdomen to visualize the entire mass) for further evaluation. 2.  Aortic Atherosclerosis (ICD10-I70.0). 3. Limited evaluation on this noncontrast study. Electronically Signed   By: Tish Frederickson M.D.   On: 11/06/2023 20:39   US PELVIS (TRANSABDOMINAL ONLY) Result Date: 11/06/2023 CLINICAL DATA:  Pelvic mass EXAM: TRANSABDOMINAL ULTRASOUND OF PELVIS TECHNIQUE: Transabdominal ultrasound examination of the pelvis was performed including evaluation of the uterus, ovaries, adnexal regions, and pelvic cul-de-sac. COMPARISON:  CT 05/19/2023 FINDINGS: Uterus Measurements: 8.9 x 4.0 x 5.0 cm = volume:  93 mL. No fibroids or other mass visualized. Endometrium Thickness: 5 mm in thickness.  No focal abnormality visualized. Right ovary Measurements: Right ovary tissue not definitively visualized. Large cystic mass in the right adnexae and extending into the abdomen measuring 18 x 20 x 14 cm. This is similar prior CT. Left ovary Measurements: 3.2 x 3.4 x 3.9 cm = volume: 22 mL. Normal appearance/no adnexal mass. Other findings:  No abnormal free fluid. IMPRESSION: 20 cm cystic mass noted in the pelvis and extending into the abdomen may arise from the right ovary. Normal right ovarian tissue not visualized. This is similar to prior CT and remains suspicious for low-grade ovarian neoplasm. Recommend gynecologic consultation. Electronically Signed   By: Charlett Nose M.D.   On: 11/06/2023 19:20   US RENAL Result Date: 11/06/2023 CLINICAL DATA:  AKI. EXAM: RENAL / URINARY TRACT ULTRASOUND COMPLETE COMPARISON:  05/19/2023. FINDINGS: Right Kidney: Renal measurements: 9.0 x 4.4 x 5.0 cm = volume: 103.94 mL. Increased parenchymal echogenicity. No mass or hydronephrosis visualized. Left Kidney: Renal measurements: 9.7 x 5.2 x 3.8 cm = volume: 99.58 mL. Increased parenchymal echogenicity. No mass or hydronephrosis visualized. Bladder: Appears normal for degree of bladder distention. Other: None. IMPRESSION: Increased renal echogenicity bilaterally suggesting medical renal disease. Electronically Signed   By: Thornell Sartorius M.D.   On: 11/06/2023 19:15   MR LUMBAR SPINE WO CONTRAST Result Date: 11/06/2023 CLINICAL DATA:  Lumbar radiculopathy, cancer suspected. EXAM: MRI LUMBAR SPINE WITHOUT CONTRAST TECHNIQUE: Multiplanar, multisequence MR imaging of the lumbar spine was performed. No intravenous contrast was administered. COMPARISON:  CT renal stone study 05/19/2023 FINDINGS: Segmentation: 5 non rib-bearing lumbar type vertebral bodies are present. The lowest fully formed vertebral body is L5. Alignment: No significant  listhesis is present. Lumbar lordosis is maintained. Vertebrae:  Marrow signal and vertebral body heights are normal. Conus medullaris and cauda equina: Conus extends to the L2 level. Conus and cauda equina appear normal. Paraspinal and other soft tissues: A large cystic mass is again partially visualized. The paraspinous soft tissues are otherwise unremarkable. The bowel is displaced. Right ureter is dilated. Mild right-sided hydronephrosis is present. Disc levels: L1-2: Normal disc signal and height is present. No focal protrusion or stenosis is present. L2-3: A leftward disc protrusion is present. No focal stenosis is present. L3-4: Normal disc signal and height is  present. No focal protrusion or stenosis is present. L4-5: A broad-based disc protrusion is present. Mild facet hypertrophy is noted bilaterally. Mild subarticular and foraminal narrowing is present bilaterally. L5-S1: Normal disc signal and height is present. No focal protrusion or stenosis is present. IMPRESSION: 1. Mild subarticular and foraminal narrowing bilaterally at L4-5 secondary to a broad-based disc protrusion and mild facet hypertrophy. 2. Leftward disc protrusion at L2-3 without stenosis. 3. Large cystic mass partially visualized in the pelvis. This remains concerning for an ovarian neoplasm. Air 4. Mild right-sided hydronephrosis and hydroureter. Electronically Signed   By: Marin Roberts M.D.   On: 11/06/2023 18:37   MR BRAIN WO CONTRAST Result Date: 11/06/2023 CLINICAL DATA:  Neuro deficit, acute, stroke suspected. Right lower extremity weakness. EXAM: MRI HEAD WITHOUT CONTRAST TECHNIQUE: Multiplanar, multiecho pulse sequences of the brain and surrounding structures were obtained without intravenous contrast. COMPARISON:  CT head without contrast 11/06/2023. MR head without and with contrast 04/15/2020 FINDINGS: Brain: Chronic encephalomalacia is present in the right MCA territory with sparing of the right temporal lobe. Remote  blood products are noted anteriorly and superiorly. No acute infarct or hemorrhage is present. Ex vacuo dilation of the right lateral ventricle is noted. Ventricles are otherwise normal in size. Deep brain nuclei are within normal limits. Wall layering degeneration is present in right cerebral peduncle. White matter changes extend into the brainstem. The brainstem and cerebellum are otherwise within normal limits. The internal auditory canals are within normal limits. Vascular: Abnormal signal is present within the right internal carotid artery proximal to the ophthalmic segment. Flow is present in the left internal carotid artery and both vertebral arteries. Skull and upper cervical spine: The craniocervical junction is normal. Upper cervical spine is within normal limits. Marrow signal is unremarkable. Sinuses/Orbits: The paranasal sinuses and mastoid air cells are clear. The globes and orbits are within normal limits. IMPRESSION: 1. No acute intracranial abnormality. 2. Chronic encephalomalacia in the right MCA territory with sparing of the right temporal lobe. 3. Abnormal signal within the right internal carotid artery proximal to the ophthalmic segment. This likely reflects chronic occlusion. 4. White matter changes as described likely reflect the sequela of chronic microvascular ischemia. Electronically Signed   By: Marin Roberts M.D.   On: 11/06/2023 18:14   CT HEAD WO CONTRAST Result Date: 11/06/2023 CLINICAL DATA:  Right leg weakness, stroke suspected EXAM: CT HEAD WITHOUT CONTRAST TECHNIQUE: Contiguous axial images were obtained from the base of the skull through the vertex without intravenous contrast. RADIATION DOSE REDUCTION: This exam was performed according to the departmental dose-optimization program which includes automated exposure control, adjustment of the mA and/or kV according to patient size and/or use of iterative reconstruction technique. COMPARISON:  04/15/2020 CT head and MRI  head FINDINGS: Brain: Extensive hypodensity in the right cerebral hemisphere, which is new from the prior CT; some of this likely correlates with evolution of the acute infarcts noted on 04/15/2020, although the area involved is greater than would be expected. Within this area, there are foci of slightly increased density (series 2, images 24 and 25 close), which could represent hemorrhage or gliosis. Ex vacuo dilatation of the right lateral ventricle. Well layering degeneration of the right pons and midbrain. No mass, mass effect, or midline shift. No hydrocephalus or extra-axial collection. Vascular: No hyperdense vessel. Skull: Negative for fracture or focal lesion. Sinuses/Orbits: No acute finding. Other: The mastoid air cells are well aerated. IMPRESSION: Extensive hypodensity in the right cerebral hemisphere, which is new  from the prior CT; some of this likely correlates with evolution of the acute infarcts noted on 04/15/2020, although the area involved is greater than would be expected. Within this area, there are foci of slightly increased density, which could represent hemorrhage or gliosis. An MRI of the brain is recommended for further evaluation. These results were called by telephone at the time of interpretation on 11/06/2023 at 3:09 pm to provider G[place], who verbally acknowledged these results. Electronically Signed   By: Wiliam Ke M.D.   On: 11/06/2023 15:09   US Abdomen Limited RUQ (LIVER/GB) Result Date: 11/01/2023 CLINICAL DATA:  Elevated liver function studies. EXAM: ULTRASOUND ABDOMEN LIMITED RIGHT UPPER QUADRANT COMPARISON:  CT 05/19/2023 FINDINGS: Gallbladder: No gallstones or wall thickening visualized. No sonographic Murphy sign noted by sonographer. Common bile duct: Diameter: 4 mm, normal Liver: No focal lesion identified. Within normal limits in parenchymal echogenicity. Portal vein is patent on color Doppler imaging with normal direction of blood flow towards the liver. Other:  None. IMPRESSION: Normal examination. No evidence of cholelithiasis or acute cholecystitis. Electronically Signed   By: Burman Nieves M.D.   On: 11/01/2023 18:03   CUP PACEART REMOTE DEVICE CHECK Result Date: 10/26/2023 ILR summary report received. Battery status OK. Normal device function. No new symptom, tachy, brady, or pause episodes. No new AF episodes. Monthly summary reports and ROV/PRN KS, CVRS    TODAY-DAY OF DISCHARGE:  Subjective:   Ebony Matthews today has no headache,no chest abdominal pain,no new weakness tingling or numbness, feels much better wants to go home today.   Objective:   Blood pressure 124/78, pulse 88, temperature 98.4 F (36.9 C), temperature source Oral, resp. rate 17, height 5\' 7"  (1.702 m), weight 98.1 kg, last menstrual period 10/03/2023, SpO2 98%.  Intake/Output Summary (Last 24 hours) at 11/14/2023 0858 Last data filed at 11/14/2023 1610 Gross per 24 hour  Intake 560 ml  Output 2600 ml  Net -2040 ml   Filed Weights   11/12/23 0431 11/13/23 0940 11/14/23 0431  Weight: 78.7 kg 93.4 kg 98.1 kg    Exam: Awake Alert, Oriented *3, No new F.N deficits, Normal affect [place].AT,PERRAL Supple Neck,No JVD, No cervical lymphadenopathy appriciated.  Symmetrical Chest wall movement, Good air movement bilaterally, CTAB RRR,No Gallops,Rubs or new Murmurs, No Parasternal Heave +ve B.Sounds, Abd Soft, Non tender, No organomegaly appriciated, No rebound -guarding or rigidity. No Cyanosis, Clubbing or edema, No new Rash or bruise   PERTINENT RADIOLOGIC STUDIES: No results found.   PERTINENT LAB RESULTS: CBC: Recent Labs    11/12/23 0514  WBC 6.1  HGB 9.4*  HCT 29.9*  PLT 301   CMET CMP     Component Value Date/Time   NA 136 11/14/2023 0533   K 3.9 11/14/2023 0533   CL 102 11/14/2023 0533   CO2 23 11/14/2023 0533   GLUCOSE 134 (H) 11/14/2023 0533   BUN 40 (H) 11/14/2023 0533   CREATININE 3.79 (H) 11/14/2023 0533   CREATININE 0.82 05/30/2014  1549   CALCIUM 8.7 (L) 11/14/2023 0533   PROT 6.0 (L) 11/14/2023 0533   ALBUMIN 2.8 (L) 11/14/2023 0533   AST 109 (H) 11/14/2023 0533   ALT 171 (H) 11/14/2023 0533   ALKPHOS 50 11/14/2023 0533   BILITOT 0.6 11/14/2023 0533   GFRNONAA 14 (L) 11/14/2023 0533   GFRNONAA >89 05/30/2014 1549    GFR Estimated Creatinine Clearance: 22.8 mL/min (A) (by C-G formula based on SCr of 3.79 mg/dL (H)). No results for input(s): "LIPASE", "AMYLASE"  in the last 72 hours. Recent Labs    11/12/23 0514 11/13/23 0645 11/14/23 0533  CKTOTAL 8,045* 4,960* 3,737*   Invalid input(s): "POCBNP" No results for input(s): "DDIMER" in the last 72 hours. No results for input(s): "HGBA1C" in the last 72 hours. No results for input(s): "CHOL", "HDL", "LDLCALC", "TRIG", "CHOLHDL", "LDLDIRECT" in the last 72 hours. No results for input(s): "TSH", "T4TOTAL", "T3FREE", "THYROIDAB" in the last 72 hours.  Invalid input(s): "FREET3" No results for input(s): "VITAMINB12", "FOLATE", "FERRITIN", "TIBC", "IRON", "RETICCTPCT" in the last 72 hours. Coags: No results for input(s): "INR" in the last 72 hours.  Invalid input(s): "PT" Microbiology: No results found for this or any previous visit (from the past 240 hours).  FURTHER DISCHARGE INSTRUCTIONS:  Get Medicines reviewed and adjusted: Please take all your medications with you for your next visit with your Primary MD  Laboratory/radiological data: Please request your Primary MD to go over all hospital tests and procedure/radiological results at the follow up, please ask your Primary MD to get all Hospital records sent to his/her office.  In some cases, they will be blood work, cultures and biopsy results pending at the time of your discharge. Please request that your primary care M.D. goes through all the records of your hospital data and follows up on these results.  Also Note the following: If you experience worsening of your admission symptoms, develop  shortness of breath, life threatening emergency, suicidal or homicidal thoughts you must seek medical attention immediately by calling 911 or calling your MD immediately  if symptoms less severe.  You must read complete instructions/literature along with all the possible adverse reactions/side effects for all the Medicines you take and that have been prescribed to you. Take any new Medicines after you have completely understood and accpet all the possible adverse reactions/side effects.   Do not drive when taking Pain medications or sleeping medications (Benzodaizepines)  Do not take more than prescribed Pain, Sleep and Anxiety Medications. It is not advisable to combine anxiety,sleep and pain medications without talking with your primary care practitioner  Special Instructions: If you have smoked or chewed Tobacco  in the last 2 yrs please stop smoking, stop any regular Alcohol  and or any Recreational drug use.  Wear Seat belts while driving.  Please note: You were cared for by a hospitalist during your hospital stay. Once you are discharged, your primary care physician will handle any further medical issues. Please note that NO REFILLS for any discharge medications will be authorized once you are discharged, as it is imperative that you return to your primary care physician (or establish a relationship with a primary care physician if you do not have one) for your post hospital discharge needs so that they can reassess your need for medications and monitor your lab values.  Total Time spent coordinating discharge including counseling, education and face to face time equals greater than 30 minutes.  SignedJeoffrey Massed 11/14/2023 8:58 AM

## 2023-11-14 NOTE — Plan of Care (Signed)
Pt has rested quietly throughout the night with no distress noted. Alert and oriented. On room air. SR on the monitor. Purewick intact to suction. Rolls with assist. No complaints voiced.     Problem: Clinical Measurements: Goal: Ability to maintain clinical measurements within normal limits will improve Outcome: Progressing Goal: Respiratory complications will improve Outcome: Progressing Goal: Cardiovascular complication will be avoided Outcome: Progressing   Problem: Activity: Goal: Risk for activity intolerance will decrease Outcome: Progressing   Problem: Nutrition: Goal: Adequate nutrition will be maintained Outcome: Progressing   Problem: Pain Managment: Goal: General experience of comfort will improve and/or be controlled Outcome: Progressing

## 2023-11-14 NOTE — TOC Transition Note (Addendum)
Transition of Care [facility]) - Discharge Note   Patient Details  Name: Ebony Matthews MRN: [id number] Date of Birth: 04-Sep-1981  Transition of Care [facility]) CM/SW Contact:  Verna Czech [place], [place] Phone Number: 11/14/2023, 11:08 AM   Clinical Narrative:     Patient to discharge to [facility] today. [facility] to provide transport. Patient will be going to room 117A . RN to call report to [phone].   Patient informed of discharged today.  [name], LCSW Transition of Care    Final next level of care: Home w Home Health Services Barriers to Discharge: Continued Medical Work up, English as a second language teacher   Patient Goals and CMS Choice Patient states their goals for this hospitalization and ongoing recovery are:: Rehab CMS Medicare.gov Compare Post Acute Care list provided to:: Patient Choice offered to / list presented to : Patient [place] ownership interest in [facility].provided to:: Patient    Discharge Placement                       Discharge Plan and Services Additional resources added to the After Visit Summary for   In-house Referral: Clinical Social Work   Post Acute Care Choice: Skilled Nursing Facility                               Social Drivers of Health (SDOH) Interventions SDOH Screenings   Food Insecurity: No Food Insecurity (11/06/2023)  Housing: Unknown (11/06/2023)  Transportation Needs: No Transportation Needs (11/06/2023)  Utilities: Not At Risk (11/06/2023)  Depression (PHQ2-9): Low Risk  (07/17/2020)  Tobacco Use: Medium Risk (11/01/2023)     Readmission Risk Interventions     No data to display

## 2023-11-17 NOTE — Telephone Encounter (Signed)
Spoke with the patient regarding the referral to GYN oncology. Patient scheduled as new patient with Dr Alvester Morin 3/31 at 11:15 am. Patient given an arrival time of 10:45 am. Explained to the patient the the doctor will perform a pelvic exam at this visit. Patient given the policy that only one visitor allowed and that visitor must be over 16 yrs are allowed in the Cancer Center. Patient given the address/phone number for the clinic and that the center offers free valet service. Patient aware that masks required.

## 2023-11-27 ENCOUNTER — Telehealth: Payer: Self-pay

## 2023-11-27 NOTE — Telephone Encounter (Signed)
 Bi[employer] remote monitor not connected. Spoke to patient in regards to monitor disconnected. Pt states she is at a nursing facility. Will have family member this weekend.

## 2023-11-30 ENCOUNTER — Ambulatory Visit (INDEPENDENT_AMBULATORY_CARE_PROVIDER_SITE_OTHER): Payer: 59

## 2023-11-30 DIAGNOSIS — G459 Transient cerebral ischemic attack, unspecified: Secondary | ICD-10-CM | POA: Diagnosis not present

## 2023-11-30 NOTE — Telephone Encounter (Signed)
 Pt monitor has updated 11/30/2023.

## 2023-12-03 LAB — CUP PACEART REMOTE DEVICE CHECK
Date Time Interrogation Session: 20250306140125
Implantable Pulse Generator Implant Date: 20220318
Pulse Gen Model: 436066
Pulse Gen Serial Number: 94060471

## 2023-12-04 ENCOUNTER — Other Ambulatory Visit: Payer: Self-pay | Admitting: Cardiology

## 2023-12-04 DIAGNOSIS — R9439 Abnormal result of other cardiovascular function study: Secondary | ICD-10-CM

## 2023-12-04 DIAGNOSIS — R072 Precordial pain: Secondary | ICD-10-CM

## 2023-12-04 NOTE — Progress Notes (Signed)
 [employer]

## 2023-12-25 ENCOUNTER — Encounter: Payer: Self-pay | Admitting: Psychiatry

## 2023-12-28 ENCOUNTER — Telehealth: Payer: Self-pay | Admitting: *Deleted

## 2023-12-28 ENCOUNTER — Encounter: Payer: Self-pay | Admitting: Psychiatry

## 2023-12-28 ENCOUNTER — Inpatient Hospital Stay: Attending: Psychiatry

## 2023-12-28 ENCOUNTER — Inpatient Hospital Stay (HOSPITAL_BASED_OUTPATIENT_CLINIC_OR_DEPARTMENT_OTHER): Payer: 59 | Admitting: Psychiatry

## 2023-12-28 VITALS — BP 146/90 | HR 87 | Temp 98.1°F | Resp 20 | Ht 67.0 in | Wt 206.4 lb

## 2023-12-28 DIAGNOSIS — N838 Other noninflammatory disorders of ovary, fallopian tube and broad ligament: Secondary | ICD-10-CM

## 2023-12-28 DIAGNOSIS — N184 Chronic kidney disease, stage 4 (severe): Secondary | ICD-10-CM

## 2023-12-28 DIAGNOSIS — R1900 Intra-abdominal and pelvic swelling, mass and lump, unspecified site: Secondary | ICD-10-CM | POA: Diagnosis present

## 2023-12-28 DIAGNOSIS — E785 Hyperlipidemia, unspecified: Secondary | ICD-10-CM | POA: Insufficient documentation

## 2023-12-28 DIAGNOSIS — I129 Hypertensive chronic kidney disease with stage 1 through stage 4 chronic kidney disease, or unspecified chronic kidney disease: Secondary | ICD-10-CM | POA: Insufficient documentation

## 2023-12-28 DIAGNOSIS — I69354 Hemiplegia and hemiparesis following cerebral infarction affecting left non-dominant side: Secondary | ICD-10-CM | POA: Insufficient documentation

## 2023-12-28 DIAGNOSIS — E1122 Type 2 diabetes mellitus with diabetic chronic kidney disease: Secondary | ICD-10-CM | POA: Insufficient documentation

## 2023-12-28 DIAGNOSIS — Z7902 Long term (current) use of antithrombotics/antiplatelets: Secondary | ICD-10-CM | POA: Insufficient documentation

## 2023-12-28 DIAGNOSIS — N189 Chronic kidney disease, unspecified: Secondary | ICD-10-CM

## 2023-12-28 DIAGNOSIS — R978 Other abnormal tumor markers: Secondary | ICD-10-CM | POA: Diagnosis not present

## 2023-12-28 DIAGNOSIS — Z794 Long term (current) use of insulin: Secondary | ICD-10-CM | POA: Insufficient documentation

## 2023-12-28 LAB — CBC WITH DIFFERENTIAL (CANCER CENTER ONLY)
Abs Immature Granulocytes: 0.01 10*3/uL (ref 0.00–0.07)
Basophils Absolute: 0 10*3/uL (ref 0.0–0.1)
Basophils Relative: 1 %
Eosinophils Absolute: 0.4 10*3/uL (ref 0.0–0.5)
Eosinophils Relative: 7 %
HCT: 32.4 % — ABNORMAL LOW (ref 36.0–46.0)
Hemoglobin: 10 g/dL — ABNORMAL LOW (ref 12.0–15.0)
Immature Granulocytes: 0 %
Lymphocytes Relative: 38 %
Lymphs Abs: 2.4 10*3/uL (ref 0.7–4.0)
MCH: 28 pg (ref 26.0–34.0)
MCHC: 30.9 g/dL (ref 30.0–36.0)
MCV: 90.8 fL (ref 80.0–100.0)
Monocytes Absolute: 0.6 10*3/uL (ref 0.1–1.0)
Monocytes Relative: 10 %
Neutro Abs: 2.8 10*3/uL (ref 1.7–7.7)
Neutrophils Relative %: 44 %
Platelet Count: 271 10*3/uL (ref 150–400)
RBC: 3.57 MIL/uL — ABNORMAL LOW (ref 3.87–5.11)
RDW: 16.1 % — ABNORMAL HIGH (ref 11.5–15.5)
WBC Count: 6.3 10*3/uL (ref 4.0–10.5)
nRBC: 0 % (ref 0.0–0.2)

## 2023-12-28 LAB — CMP (CANCER CENTER ONLY)
ALT: 14 U/L (ref 0–44)
AST: 12 U/L — ABNORMAL LOW (ref 15–41)
Albumin: 4.2 g/dL (ref 3.5–5.0)
Alkaline Phosphatase: 70 U/L (ref 38–126)
Anion gap: 8 (ref 5–15)
BUN: 21 mg/dL — ABNORMAL HIGH (ref 6–20)
CO2: 26 mmol/L (ref 22–32)
Calcium: 9.4 mg/dL (ref 8.9–10.3)
Chloride: 107 mmol/L (ref 98–111)
Creatinine: 1.38 mg/dL — ABNORMAL HIGH (ref 0.44–1.00)
GFR, Estimated: 48 mL/min — ABNORMAL LOW (ref >60)
Glucose, Bld: 152 mg/dL — ABNORMAL HIGH (ref 70–99)
Potassium: 3.5 mmol/L (ref 3.5–5.1)
Sodium: 141 mmol/L (ref 135–145)
Total Bilirubin: 0.4 mg/dL (ref 0.0–1.2)
Total Protein: 7.6 g/dL (ref 6.5–8.1)

## 2023-12-28 LAB — PHOSPHORUS: Phosphorus: 3.9 mg/dL (ref 2.5–4.6)

## 2023-12-28 LAB — LACTATE DEHYDROGENASE: LDH: 182 U/L (ref 98–192)

## 2023-12-28 NOTE — Telephone Encounter (Signed)
 Per Dr Alvester Morin fax records and surgical optimization form to the following offices   [facility] [phone] Dr Everlena Cooper [phone] Dr Heywood Footman [phone]

## 2023-12-28 NOTE — Progress Notes (Signed)
 GYNECOLOGIC ONCOLOGY NEW PATIENT CONSULTATION  Date of Service: 12/28/2023 Referring Provider: Avie Boeck, MD   ASSESSMENT AND PLAN: Ebony Matthews is a 43 y.o. woman, with a complex medical history including CVA with left-sided weakness, hypertension, kidney disease, diabetes, and recent hospitalization following a fall for treatment of AKI and rhabdomyolysis who presents for evaluation of incidentally identified 22cm cystic pelvic mass.  We reviewed that the exact etiology of the pelvic mass is unclear, but could include a benign, borderline, or malignant process.  The recommended treatment is surgical excision to make a definitive diagnosis.The only abnormal tumor marker was an elevated LDH but this may have been in the setting of acute illness. Will repeat today.  The mass is relatively large. However we reviewed the possibility of a controlled cyst drainage and removal via a minimally invasive approach using robotic assistance.  Feel that this would be beneficial particularly in the setting of patient's medical comorbidities to minimize surgical comorbidity and risk.  In the event of malignancy or borderline tumor on frozen section, we will perform indicated staging procedures. We discussed that these procedures may include removal of contralateral ovary, hysterectomy, omentectomy pelvic and/or para-aortic lymphadenectomy, peritoneal biopsies. We would also remove any tissue concerning for metastatic disease which could require additional procedures including bowel surgery.  Patient was consented for: Biotic assisted unilateral salpingo-oophorectomy, mini laparotomy for controlled cyst drainage, possible bilateral salpingo-oophorectomy, hysterectomy, staging on 02/09/24.  The risks of surgery were discussed in detail and she understands these to including but not limited to bleeding requiring a blood transfusion, infection, injury to adjacent organs (including but not limited to the  bowels, bladder, ureters, nerves, blood vessels), thromboembolic events, wound separation, hernia, vaginal cuff separation, possible risk of lymphedema and lymphocyst if lymphadenectomy performed, unforseen complication, possible need for re-exploration, and medical complications such as heart attack, stroke, pneumonia.  If the patient experiences any of these events, she understands that her hospitalization or recovery may be prolonged and that she may need to take additional medications for a prolonged period. The patient will receive DVT and antibiotic prophylaxis as indicated. She voiced a clear understanding. She had the opportunity to ask questions and informed consent was obtained today. She wishes to proceed.  She will proceed to the lab today for CBC, CMP, LDH. We will plan for nephrology and cardiology clearance. Pt has a loop recorder in place so we will need to see what are the perioperative requirements concerning this device She will return for a NP/RN preop visit closer to the time of surgery.  A copy of this note was sent to the patient's referring provider.  Derrel Flies, MD Gynecologic Oncology   Medical Decision Making I personally spent  TOTAL 70 minutes face-to-face and non-face-to-face in the care of this patient, which includes all pre, intra, and post visit time on the date of service.   ------------  CC: Pelvic mass  HISTORY OF PRESENT ILLNESS:  Ebony Matthews is a 43 y.o. woman who is seen in consultation at the request of  Avie Boeck, MD for evaluation of pelvic mass.  Submitted to the hospital from 11/06/2023 to 11/14/2023 for a fall and found to have an AKI with rhabdomyolysis.  She is also medically complicated by history of CVA with left-sided weakness, hypertension, CKD stage IV, diabetes.  On her imaging she was incidentally identified to have a pelvic mass.  Pelvic ultrasound showed a 20 cm cystic mass with no normal right ovarian tissue  identified.  CT abdomen/pelvis redemonstrated the 22 cm cystic pelvic lesion.  There was no lymphadenopathy or free fluid identified.  MRI pelvis was then performed on 11/09/2023 which noted a normal left ovary and no normal right ovary identified.  There was a complex 18 x 10.6 x 22.3 cm cystic mass with some nodular areas along the anterior aspect of the lesion.  No IV contrast was used.  GYN was consulted.  A CA125 was collected and was normal at 2.7.  GYN reach out to GYN oncology who recommended additional tumor markers.  CEA was normal at 2.3, LDH was elevated at 591, AFP was normal at 2.3, inhibin B was normal for premenopausal individual at 55.8, hCG was negative, and CA 19-9 was normal at 29.  Today patient reports that she initially went to rehab after hospitalization but is now home.  She reports regular monthly periods and no pelvic pain or pressure.  She otherwise denies abdominal bloating, early satiety, significant weight loss, change in bowel or bladder habits.   In terms of her other medical comorbidities, patient experienced a stroke in 2021 with left-sided weakness.  This has gotten better since that time but still has some residual weakness.  She lives independently and uses a quad cane and rollator when she is out and about to walk.  She follows with neurology, Dr. Jake Mayers at [facility] and has follow-up with him tomorrow.  In terms of her diabetes, she follows with her primary care provider Dr. Bernetta Brilliant.  She uses mealtime insulin, 1 to 2 units at meals.  Her A1c was 6.4 on 11/06/2023.  She last saw Dr. Mason Sole on 12/23/2023 at which time she was just restarted on metoprolol 25 mg.  She also has follow-up with nephrology with [facility] on Wednesday.  Additionally she has a loop recorder in place with cardiology.  She follows with Dr. Albert Huff and was last seen on 05/21/23.   PAST MEDICAL HISTORY: Past Medical History:  Diagnosis Date   Diabetes mellitus without complication (HCC)     Hyperlipidemia    Hypertension    Loop recorder [employer] 12/14/2020 12/14/2020   Scheduled Remote loop recorder check 12/14/2020: NSR. New implant.    Stroke [facility])    TIA (transient ischemic attack) 01/22/2019    PAST SURGICAL HISTORY: Past Surgical History:  Procedure Laterality Date   NO PAST SURGERIES      OB/GYN HISTORY: OB History  Gravida Para Term Preterm AB Living  1    1   SAB IAB Ectopic Multiple Live Births  1        # Outcome Date GA Lbr Len/2nd Weight Sex Type Anes PTL Lv  1 SAB               Age at menarche: 73 or 50 Age at menopause: n/a Hx of HRT: no Hx of STI: no Last pap: 04/22/21 NILM, HPV neg History of abnormal pap smears: no  SCREENING STUDIES:  Last mammogram: none Last colonoscopy: none  MEDICATIONS:  Current Outpatient Medications:    aspirin EC 81 MG tablet, Take 81 mg by mouth daily. Swallow whole., Disp: , Rfl:    BD PEN NEEDLE MICRO U/F 32G X 6 MM MISC, Inject into the skin as directed., Disp: , Rfl:    clopidogrel (PLAVIX) 75 MG tablet, Take 1 tablet (75 mg total) by mouth daily., Disp: 30 tablet, Rfl: 0   folic acid (FOLVITE) 1 MG tablet, Take 1 tablet (1 mg total) by mouth daily.,  Disp: , Rfl:    insulin aspart (NOVOLOG FLEXPEN) 100 UNIT/ML FlexPen, 0-9 Units, Subcutaneous, 3 times daily with meals CBG < 70: Implement Hypoglycemia measures CBG 70 - 120: 0 units CBG 121 - 150: 1 unit CBG 151 - 200: 2 units CBG 201 - 250: 3 units CBG 251 - 300: 5 units CBG 301 - 350: 7 units CBG 351 - 400: 9 units CBG > 400: call MD, Disp: , Rfl:    cyanocobalamin (VITAMIN B12) 1000 MCG/ML injection, 1000 mcg SQ weekly x 4 weeks, then 1000 mcg SQ monthly (Patient not taking: Reported on 12/25/2023), Disp: , Rfl:    nitroGLYCERIN (NITROSTAT) 0.4 MG SL tablet, Place 1 tablet (0.4 mg total) under the tongue every 5 (five) minutes as needed for chest pain. If you require more than two tablets five minutes apart go to the nearest ER via EMS., Disp: 30 tablet,  Rfl: 0  ALLERGIES: No Known Allergies  FAMILY HISTORY: Family History  Problem Relation Age of Onset   Hypertension Maternal Grandmother    Diabetes Other    Colon cancer Neg Hx    Ovarian cancer Neg Hx    Endometrial cancer Neg Hx    Pancreatic cancer Neg Hx    Prostate cancer Neg Hx     SOCIAL HISTORY: Social History   Socioeconomic History   Marital status: Single    Spouse name: Not on file   Number of children: 0   Years of education: Not on file   Highest education level: Not on file  Occupational History   Not on file  Tobacco Use   Smoking status: Former    Current packs/day: 0.00    Average packs/day: 1 pack/day for 15.0 years (15.0 ttl pk-yrs)    Types: Cigarettes    Start date: 2006    Quit date: 2021    Years since quitting: 4.2   Smokeless tobacco: Never  Vaping Use   Vaping status: Never Used  Substance and Sexual Activity   Alcohol use: No   Drug use: Not Currently    Types: Marijuana, Cocaine    Comment: occasional   Sexual activity: Yes    Birth control/protection: None  Other Topics Concern   Not on file  Social History Narrative   Right handed   Lives with friend one story home   Social Drivers of Health   Financial Resource Strain: Not on file  Food Insecurity: No Food Insecurity (11/06/2023)   Hunger Vital Sign    Worried About Running Out of Food in the Last Year: Never true    Ran Out of Food in the Last Year: Never true  Transportation Needs: No Transportation Needs (11/06/2023)   PRAPARE - Administrator, Civil Service (Medical): No    Lack of Transportation (Non-Medical): No  Physical Activity: Not on file  Stress: Not on file  Social Connections: Not on file  Intimate Partner Violence: Not At Risk (11/06/2023)   Humiliation, Afraid, Rape, and Kick questionnaire    Fear of Current or Ex-Partner: No    Emotionally Abused: No    Physically Abused: No    Sexually Abused: No    REVIEW OF SYSTEMS: New patient intake  form was reviewed.  Complete 10-system review is negative except for the following: none  PHYSICAL EXAM: BP (!) 164/78 (BP Location: Left Arm, Patient Position: Sitting) Comment: Notified RN  Pulse 87   Temp 98.1 F (36.7 C) (Oral)   Resp 20  Ht 5\' 7"  (1.702 m)   Wt 206 lb 6.4 oz (93.6 kg)   SpO2 94%   BMI 32.33 kg/m  Constitutional: No acute distress. Neuro/Psych: Alert, oriented.  Head and Neck: Normocephalic, atraumatic. Neck symmetric without masses. Sclera anicteric.  Respiratory: Normal work of breathing. Clear to auscultation bilaterally. Cardiovascular: Regular rate and rhythm, no murmurs, rubs, or gallops. Abdomen: Normoactive bowel sounds. Mass in abdomen palpated Extremities: Grossly normal range of motion. Warm, well perfused.  Skin: No rashes or lesions. Lymphatic: No cervical, supraclavicular, or inguinal adenopathy. Genitourinary: External genitalia without lesions. Urethral meatus without lesions or prolapse. On speculum exam, vagina and cervix without lesions. Bimanual exam reveals normal cervix and large abdominopelvic mass.  Exam chaperoned by Vira Grieves, NP   LABORATORY AND RADIOLOGIC DATA: Outside medical records were reviewed to synthesize the above history, along with the history and physical obtained during the visit.  Outside laboratory, and imaging reports were reviewed, with pertinent results below.  I personally reviewed the outside images.  WBC  Date Value Ref Range Status  11/12/2023 6.1 4.0 - 10.5 K/uL Final   Hemoglobin  Date Value Ref Range Status  11/12/2023 9.4 (L) 12.0 - 15.0 g/dL Final   HCT  Date Value Ref Range Status  11/12/2023 29.9 (L) 36.0 - 46.0 % Final   Platelets  Date Value Ref Range Status  11/12/2023 301 150 - 400 K/uL Final   LDH  Date Value Ref Range Status  11/11/2023 591 (H) 98 - 192 U/L Final    Comment:    Performed at [facility], [address], [place], [place] [address]   Creat  Date Value Ref  Range Status  05/30/2014 0.82 0.50 - 1.10 mg/dL Final   Creatinine, Ser  Date Value Ref Range Status  11/14/2023 3.79 (H) 0.44 - 1.00 mg/dL Final   AST  Date Value Ref Range Status  11/14/2023 109 (H) 15 - 41 U/L Final   ALT  Date Value Ref Range Status  11/14/2023 171 (H) 0 - 44 U/L Final   Cancer Antigen (CA) 125  Date Value Ref Range Status  11/08/2023 2.7 0.0 - 38.1 U/mL Final    Comment:    (NOTE) Roche [employer]ostics Electrochemiluminescence Immunoassay (ECLIA) Values obtained with different assay methods or kits cannot be used interchangeably.  Results cannot be interpreted as absolute evidence of the presence or absence of malignant disease. Performed At: [facility] [address] [place], [place] [id number] Pearlean Botts MD [id number]    Diagnosis  Date Value Ref Range Status  04/22/2021   Final   - Negative for intraepithelial lesion or malignancy (NILM)  CEA - 2.3  LDH - 591 (elevated) AFP - 2.3 Inhibin B - 55.8 hCG - negative CA 19-9 - 29  MR PELVIS WO CONTRAST 11/09/2023  Narrative CLINICAL DATA:  Adnexal mass  EXAM: MRI PELVIS WITHOUT CONTRAST  TECHNIQUE: Multiplanar multisequence MR imaging of the pelvis was performed. No intravenous contrast was administered.  COMPARISON:  CT scan 11/06/2023 and older going back to May 19, 2023. Remote ultrasound of the pelvis report from 2011 December  FINDINGS: Urinary Tract: Preserved contour to the urinary bladder. Grossly normal course and caliber to the urethra.  Bowel: Visualized bowel in the pelvis is nondilated. This includes small and large bowel.  Vascular/Lymphatic: Normal caliber aorta and iliac vessels. There is some plaque seen along the left common femoral artery at the edge of the imaging field. No discrete abnormal lymph node enlargement  identified in the pelvis. There are several small less than 1 cm in size iliac chain nodes, not pathologic by size  criteria.  Reproductive: Uterus measures 7.7 x 4.5 by 4.9 cm. Endometrial stripe of 9 mm. Junctional zone close to 8 mm. No cystic areas along the junctional zone. No myometrial lesion.  Left ovary has follicles measuring 2.4 x 3.9 cm by 2.8 cm. Largest dominant follicle or small cyst measures 19 x 14 by 18 mm.  The right ovary is not seen as a discrete separate structure. There is a very large cystic mass identified in the anterior central pelvis. This complex lesion has dimensions today approaching 18.0 by 10.6 by 22.3 cm. On the CT scan of August 2024 lesion would have measured 20.3 x 17.9 x 11.2 cm. There are some nodular areas along the anterior aspect of this lesion. This was seen before with component of calcification. Example nodular area that is noncalcified measures 4.5 by 1.8 cm in the axial plane on series 6, image 25. This is not as well characterized without the advantage of IV contrast but this area of nodularity in the lesion does show some restricted diffusion. There is some additional small frondlike areas identified elsewhere along the anterior aspect of the cystic lesion including along left side on series 6, image 30. Due to its size is difficult to ascertain the exact origin of this mass but this very well could be an ovarian lesion and a malignant process is possible.  Other:  Small amount of free fluid in the pelvis.  Musculoskeletal: No suspicious bone lesions identified.  Anasarca.  Evaluation limited by motion.  IMPRESSION: Very large cystic lesion identified in the central pelvis with some nodular potential soft tissue elements. Small amount of free fluid the pelvis. Based on the overall appearance this very well could be an ovarian lesion and a cystic neoplasm of the ovary is possible including an aggressive or malignant process. The right ovary is not seen as a separate structure. Recommend surgical consultation.  In principle with a size this  lesion is difficult to ascertain the exact organ of origin as this does abut several structures. In addition there is some potential soft tissue elements, and if needed additional evaluation with a postcontrast set of images can be obtained.  Motion   Electronically Signed By: Adrianna Horde M.D. On: 11/09/2023 12:57   CT ABDOMEN PELVIS WO CONTRAST 11/06/2023  Narrative CLINICAL DATA:  Adnexal mass, malignancy suspected  EXAM: CT ABDOMEN AND PELVIS WITHOUT CONTRAST  TECHNIQUE: Multidetector CT imaging of the abdomen and pelvis was performed following the standard protocol without IV contrast.  RADIATION DOSE REDUCTION: This exam was performed according to the departmental dose-optimization program which includes automated exposure control, adjustment of the mA and/or kV according to patient size and/or use of iterative reconstruction technique.  COMPARISON:  CT renal 05/19/2023  FINDINGS: Lower chest: No acute abnormality.  Hepatobiliary: No focal liver abnormality. No gallstones, gallbladder wall thickening, or pericholecystic fluid. No biliary dilatation.  Pancreas: No focal lesion. Normal pancreatic contour. No surrounding inflammatory changes. No main pancreatic ductal dilatation.  Spleen: Normal in size without focal abnormality.  Adrenals/Urinary Tract:  No adrenal nodule bilaterally.  No nephrolithiasis and no hydronephrosis. No definite contour-deforming renal mass.  No ureterolithiasis or hydroureter.  The urinary bladder is unremarkable.  Stomach/Bowel: Stomach is within normal limits. No evidence of bowel wall thickening or dilatation. Appendix appears normal.  Vascular/Lymphatic: No abdominal aorta or iliac aneurysm. Moderate to severe  atherosclerotic plaque of the aorta and its branches. No abdominal, pelvic, or inguinal lymphadenopathy.  Reproductive: Uterus is unremarkable. Similar-appearing 18 x 12 x22 cm cystic pelvic lesion with  associated calcification (3:71). This mass abuts the uterus as well as the sigmoid colon and several loops of small bowel.  Other: No intraperitoneal free fluid. No intraperitoneal free gas. No organized fluid collection.  Musculoskeletal:  No abdominal wall hernia or abnormality.  No suspicious lytic or blastic osseous lesions. No acute displaced fracture. Multilevel degenerative changes of the spine.  IMPRESSION: 1. Similar-appearing complex 22 cm cystic pelvic lesion. Malignancy is not excluded. Recommend gynecologic consultation and MRI pelvis with and without contrast (including the mid abdomen to visualize the entire mass) for further evaluation. 2.  Aortic Atherosclerosis (ICD10-I70.0). 3. Limited evaluation on this noncontrast study.   Electronically Signed By: Morgane  Naveau M.D. On: 11/06/2023 20:39

## 2023-12-28 NOTE — Telephone Encounter (Signed)
   Pre-operative Risk Assessment    Patient Name: Ebony Matthews  DOB: 07-01-1981 MRN: [id number]   Date of last office visit: 04/2023 Date of next office visit: N/A   Request for Surgical Clearance    Procedure:   ROBOTIC ASSISTED LAP UNILATERAL SALPINGO-OOPHORECTOMY, POSSIBLE TOTAL HYSTERECTOMY, POSSIBLE STAGING, MINI LAPAROTOMY, POSSIBLE BIALTERAL SALPINGO-OOPHORECTOMY  Date of Surgery:  Clearance 02/02/24                                Surgeon:  Clide Cliff  Surgeon's Group or Practice Name:  [facility] CANCER / GYNECOLOGY ONCOLOGY Phone number:  [phone] Fax number:  [phone]   Type of Clearance Requested:   - Medical  - Pharmacy:  Hold Aspirin and Clopidogrel (Plavix) NOT INDICATED BUT IS ASKING IF PT CAN HAVE SURGERY AT [facility] LONG AND DOES PT NEED TO STAY OVERNIGHT   Type of Anesthesia:  General    Additional requests/questions:    Wilhemina Cash   12/28/2023, 3:02 PM

## 2023-12-28 NOTE — Progress Notes (Unsigned)
 NEUROLOGY FOLLOW UP OFFICE NOTE  Ebony Matthews [id number]  Assessment/Plan:   Acute kidney injury on Chronic Kidney disease  Rhabdomyolysis  Bilateral embolic infarcts of unknown source, concern for cardiac source. Left-sided hemiparesis as late effect of right hemispheric embolic infarcts with right ICA occlusion B12 deficiency Type 2 diabetes mellitus - improving Hypertension Hyperlipidemia History of tobacco use disorder ***   1.Secondary stroke prevention as managed by PCP: - Plavix 75mg  daily - Atorvastatin 40mg  daily.  LDL goal less than 70. - Glycemic control.  Hgb A1c goal less than 7 - Normotensive blood pressure 2. Currently with implantable loop recorder to evaluate for a fib 3.  Will order bilateral carotid ultrasound.   4.  Follow up in one year.   Subjective:  Ebony Matthews is a 43 year old right-handed female with HTN and diabetes who follows up for stroke.  She is accompanied by her aunt.     UPDATE: Current medications:  Plavix 75mg , atorvastatin 40mg , metformin, Jardiance, metoprolol and Diovan-HCT   Admitted to the hospital on 11/06/2023 for generalized weakness that also exacerbated her baseline left sided weakness.  She had not been able to hold self up and fell out of bed.  CT head showed extensive hypodensity in the right cerebral hemisphere which may correlate with evolution of stroke from July 2021 but appeared greater than would be expected.  Follow up MRI of brain revealed chronic right MCA encephalomalacia from previous stroke but no new or acute/subacute infarct.  She was found to have acute on chronic kidney disease with Cr. 3.7 (2.9 back in August) and then climbed to 4.36 likely due to rhabdomylolysis from prolonged time on the floor.  Her CK was 23,267.  Statin was held and treated with IVF. CK was 3,737 and Cr 3.79 at time of discharge.   She also exhibited transaminitis from rhabdo, AST/ALT was  381/255 on presentation and 109/171 at  time of discharge.  MRI of lumbar spine without contrast revealed broad-based disc protrusion at L4-5 causing bilateral foraminal narrowing and leftward disc protrusion at L2-3 without stenosis but also large cystic mass was seen in the pelvis which was also noted on follow up US and MRI of pelvis, but could not be done with contrast due to kidney injury.  B12 was 111, TSH 2.506, Hgb A1c 6.4, HIV ab nonreactive and negative UDS.    HISTORY: She was admitted to [facility] on 03/03/2020 for 3 day history of left arm numbness and tingling and inability to grip with her left hand.  MRI of brain showed scattered multifocal subcentimeter acute ischemic infarcts involving the right frontal, parietal and temporal lobes and subcentimeter infarcts in left occipital lobe and left cerebellum.  CTA of head and neck showed narrow and tortuous bilateral cervical internal carotid arteries with mild dilation just inferior to the skull base concerning for fibromuscular dysplasia or atherosclerotic disease.  2D echocardiogram showed EF 60-65% with no cardiac source of emboli.  Labs showed LDL 202, Hgb A1c 10.5, negative Covid test, and UDS positive for cocaine and THC.  She was on ASA 325mg  daily prior to admission and was discharged on ASA 81mg  and Plavix 75mg  daily for 3 weeks, followed by ASA alone (not changed to Plavix as patient had been noncompliant on ASA).  She was on Lipitor 40mg  daily prior to admission and continued at discharge as she had been noncompliant.  Further testing for cardiac source of emboli, including TEE and loop recorder, was not  performed as she was thought to not be a good candidate for anticoagulation due to noncompliance.     On 04/14/2020, she developed worsening left arm and new left leg weakness.  She was seen in the hospital the following day.  MRI of brain showed new acute infarcts in the right cerebral hemisphere and small infarct in right pons.  CTA of head and neck showed new acute  proximal right ICA occlusion with distal reconstitution at the supraclinoid segment via collateral flow across the circle-of-Willis.  Right MCA is perfused although attenuated as compared to the contralateral left MCA.  COVID testing was negative.  She was advised to remain on dual antiplatelet therapy for 3 months followed by Plavix alone.    14 day cardiac event monitoring in January 2022 revealed occasional PACs and rare PVCs but no atrial fibrillation or heart block.  Underwent implantable loop recorder.  Carotid ultrasound on 01/22/2021 again demonstrated total occlusion of right ICA and 1-39% stenosis of left ICA with bilateral antegrade flow of vertebral arteries.   She had an abnormal stress test.  As she was asymptomatic, cardiology recommended continued medical management.    PAST MEDICAL HISTORY: Past Medical History:  Diagnosis Date   Diabetes mellitus without complication (HCC)    Hyperlipidemia    Hypertension    Loop recorder [employer] 12/14/2020 12/14/2020   Scheduled Remote loop recorder check 12/14/2020: NSR. New implant.    Stroke [facility])    TIA (transient ischemic attack) 01/22/2019    MEDICATIONS: Current Outpatient Medications on File Prior to Visit  Medication Sig Dispense Refill   aspirin EC 81 MG tablet Take 81 mg by mouth daily. Swallow whole.     BD PEN NEEDLE MICRO U/F 32G X 6 MM MISC Inject into the skin as directed.     clopidogrel (PLAVIX) 75 MG tablet Take 1 tablet (75 mg total) by mouth daily. 30 tablet 0   cyanocobalamin (VITAMIN B12) 1000 MCG/ML injection 1000 mcg SQ weekly x 4 weeks, then 1000 mcg SQ monthly (Patient not taking: Reported on 12/25/2023)     folic acid (FOLVITE) 1 MG tablet Take 1 tablet (1 mg total) by mouth daily.     insulin aspart (NOVOLOG FLEXPEN) 100 UNIT/ML FlexPen 0-9 Units, Subcutaneous, 3 times daily with meals CBG < 70: Implement Hypoglycemia measures CBG 70 - 120: 0 units CBG 121 - 150: 1 unit CBG 151 - 200: 2 units CBG 201 - 250: 3  units CBG 251 - 300: 5 units CBG 301 - 350: 7 units CBG 351 - 400: 9 units CBG > 400: call MD     nitroGLYCERIN (NITROSTAT) 0.4 MG SL tablet Place 1 tablet (0.4 mg total) under the tongue every 5 (five) minutes as needed for chest pain. If you require more than two tablets five minutes apart go to the nearest ER via EMS. 30 tablet 0   No current facility-administered medications on file prior to visit.    ALLERGIES: No Known Allergies  FAMILY HISTORY: Family History  Problem Relation Age of Onset   Hypertension Maternal Grandmother    Diabetes Other    Colon cancer Neg Hx    Ovarian cancer Neg Hx    Endometrial cancer Neg Hx    Pancreatic cancer Neg Hx    Prostate cancer Neg Hx       Objective:  *** General: No acute distress.  Patient appears well-groomed.   Head:  Normocephalic/atraumatic Eyes:  Fundi examined but not visualized Neck: supple, no  paraspinal tenderness, full range of motion Heart:  Regular rate and rhythm Neurological Exam: Alert and oriented.  Speech fluent and not dysarthric.  Language intact.  Right gaze preference.  Left lower facial weakness.  Otherwise, CN II-XII intact.  Increased tone in proximal left upper and lower extremities.  Left upper and lower extremity hemiplegia except 4-/5 left hip flexion.  5/5 right upper and lower extremities.  Sensation to light touch reduced on left upper and lower extremities.  Deep tendon reflexes 3+ left upper and lower extremities, 2+ right upper and lower extremities.  Finger to nose intact.  Hemiplegic gait.    Shon Millet, DO  CC: Velta Addison, MD

## 2023-12-28 NOTE — Patient Instructions (Addendum)
 We will tentatively plan for surgery on Feb 02, 2024 at [facility] with Dr. Clide Cliff. We will see you back in the office closer to the date for a preop appointment with Warner Mccreedy NP or Renaldo Reel RN navigator to discuss the instructions for before and after surgery.  We will need to obtain clearance and recommendations from your cardiologist for surgery.  You may also receive a phone call from the hospital to arrange for a pre-op appointment there as well. Usually both appointments can be combined on the same day.   The procedure you will be scheduled for will be robotic assisted laparoscopic unilateral salpingo-oophorectomy (removal of one ovary and fallopian tube), mini laparotomy for controlled drainage of the cyst, possible bilateral salpingo-oophorectomy (removal of both ovaries and fallopian tubes), possible total hysterectomy, possible staging if a precancer or cancer is seen.

## 2023-12-29 ENCOUNTER — Encounter: Payer: Self-pay | Admitting: Neurology

## 2023-12-29 ENCOUNTER — Ambulatory Visit (INDEPENDENT_AMBULATORY_CARE_PROVIDER_SITE_OTHER): Payer: 59 | Admitting: Neurology

## 2023-12-29 ENCOUNTER — Telehealth: Payer: Self-pay | Admitting: Oncology

## 2023-12-29 ENCOUNTER — Other Ambulatory Visit

## 2023-12-29 VITALS — BP 155/85 | HR 114 | Ht 67.0 in | Wt 207.0 lb

## 2023-12-29 DIAGNOSIS — T796XXD Traumatic ischemia of muscle, subsequent encounter: Secondary | ICD-10-CM

## 2023-12-29 DIAGNOSIS — I69354 Hemiplegia and hemiparesis following cerebral infarction affecting left non-dominant side: Secondary | ICD-10-CM

## 2023-12-29 DIAGNOSIS — E089 Diabetes mellitus due to underlying condition without complications: Secondary | ICD-10-CM

## 2023-12-29 DIAGNOSIS — I10 Essential (primary) hypertension: Secondary | ICD-10-CM

## 2023-12-29 DIAGNOSIS — I6521 Occlusion and stenosis of right carotid artery: Secondary | ICD-10-CM

## 2023-12-29 LAB — CK: Total CK: 306 U/L — ABNORMAL HIGH (ref 20–239)

## 2023-12-29 NOTE — Patient Instructions (Addendum)
 Continue clopidogrel 75mg  daily.  Stop aspirin Check CK Follow up with PCP Repeat carotid ultrasound in one year Follow up in one year or as needed.

## 2023-12-29 NOTE — Telephone Encounter (Signed)
 Called Ebony Matthews and let her know that her surgery date has been changed to 02/09/24 per Dr. Alvester Morin.  She verbalized understanding and agreement.

## 2023-12-29 NOTE — Telephone Encounter (Signed)
   Name: Ebony Matthews  DOB: [birth year]  MRN: [id number]  Primary Cardiologist: None  Chart reviewed as part of pre-operative protocol coverage. Because of Ebony Matthews's past medical history and time since last visit, she will require a follow-up in-office visit in order to better assess preoperative cardiovascular risk.  Pre-op covering staff: - Please schedule appointment and call patient to inform them. If patient already had an upcoming appointment within acceptable timeframe, please add "pre-op clearance" to the appointment notes so provider is aware. - Please contact requesting surgeon's office via preferred method (i.e, phone, fax) to inform them of need for appointment prior to surgery.  Aspirin and clopidogrel holding parameters can be addressed at the time of in person visit.  Sharlene Dory, PA-C  12/29/2023, 11:48 AM

## 2023-12-29 NOTE — Telephone Encounter (Signed)
 Pt has been scheduled in office 01/20/24 with Dr. Odis Hollingshead 9:20 for preop clearance.

## 2023-12-31 NOTE — Addendum Note (Signed)
 Addended by: Geralyn Flash D on: 12/31/2023 02:43 PM   Modules accepted: Orders

## 2023-12-31 NOTE — Progress Notes (Signed)
 [employer]

## 2024-01-04 ENCOUNTER — Telehealth: Payer: Self-pay | Admitting: *Deleted

## 2024-01-04 ENCOUNTER — Ambulatory Visit (INDEPENDENT_AMBULATORY_CARE_PROVIDER_SITE_OTHER): Payer: 59

## 2024-01-04 DIAGNOSIS — G459 Transient cerebral ischemic attack, unspecified: Secondary | ICD-10-CM | POA: Diagnosis not present

## 2024-01-04 NOTE — Telephone Encounter (Signed)
 Per provider fax surgical optimization form to the patient's nephrology office [phone])

## 2024-01-06 LAB — CUP PACEART REMOTE DEVICE CHECK
Date Time Interrogation Session: 20250407090826
Implantable Pulse Generator Implant Date: 20220318
Pulse Gen Model: 436066
Pulse Gen Serial Number: 94060471

## 2024-01-07 ENCOUNTER — Telehealth: Payer: Self-pay

## 2024-01-07 NOTE — Telephone Encounter (Signed)
 Patient advised.  Patient will contact her PCP to see if her Cholesterol was check and have the results faxed over to the office.  If not patient aware a fasting lipid panel is needed.

## 2024-01-07 NOTE — Telephone Encounter (Signed)
-----   Message from Cira Servant sent at 01/05/2024  1:11 PM EDT ----- Her CK level which measures muscle damage is now just mildly elevated, which may be normal for her.  I would like to restart her statin.  Did she have her cholesterol checked by her PCP in the last 6 months?  If so, I would like to request results.  If not, then I would like to check a fasting lipid panel before restarting the statin.

## 2024-01-07 NOTE — Telephone Encounter (Signed)
 Received nephrology clearance

## 2024-01-11 ENCOUNTER — Encounter: Payer: Self-pay | Admitting: Psychiatry

## 2024-01-18 ENCOUNTER — Ambulatory Visit
Admission: RE | Admit: 2024-01-18 | Discharge: 2024-01-18 | Disposition: A | Discharge status: Home / Self Care | Source: Ambulatory Visit | Attending: Family Medicine | Admitting: Family Medicine

## 2024-01-18 ENCOUNTER — Other Ambulatory Visit: Payer: Self-pay | Admitting: Family Medicine

## 2024-01-18 DIAGNOSIS — M7989 Other specified soft tissue disorders: Secondary | ICD-10-CM

## 2024-01-19 ENCOUNTER — Telehealth: Payer: Self-pay

## 2024-01-19 NOTE — Telephone Encounter (Signed)
   Pre-operative Risk Assessment    Patient Name: Ebony Matthews  DOB: 02-08-1981 MRN: [id number]   Date of last office visit: 05/21/23 w/ DR. Albert Huff Date of next office visit: 01/20/24 w. DR. Albert Huff   Request for Surgical Clearance    Procedure:   ROBOTIC ASSISTED LAPAROSCOPIC UNILATERAL SALPINGO-OOPHORECTOMY POSSIBLE TOTAL HYSTERECTOMY, POSSIBLE STAGING MINI LAPAROTOMY, POSSIBLE BILATERAL SALPINGO-OOPHORECTOMY   Date of Surgery:  Clearance 02/02/24                                Surgeon:  DR. Derrel Flies Surgeon's Group or Practice Name:  [facility] GYNECOLOGY ONCOLOGY  Phone number:  [phone] Fax number:  [phone]   Type of Clearance Requested:   - Medical  - Pharmacy:  Hold Clopidogrel  (Plavix ) NEEDS INSTRUCTIONS    Type of Anesthesia:  Not Indicated   Additional requests/questions:    SignedVira Grieves   01/19/2024, 4:58 PM

## 2024-01-20 ENCOUNTER — Ambulatory Visit: Attending: Cardiology | Admitting: Cardiology

## 2024-01-20 ENCOUNTER — Telehealth: Payer: Self-pay | Admitting: Neurology

## 2024-01-20 VITALS — BP 160/98 | HR 73 | Resp 16 | Ht 67.0 in | Wt 214.6 lb

## 2024-01-20 DIAGNOSIS — I63231 Cerebral infarction due to unspecified occlusion or stenosis of right carotid arteries: Secondary | ICD-10-CM

## 2024-01-20 DIAGNOSIS — Z01818 Encounter for other preprocedural examination: Secondary | ICD-10-CM | POA: Diagnosis not present

## 2024-01-20 DIAGNOSIS — I10 Essential (primary) hypertension: Secondary | ICD-10-CM

## 2024-01-20 DIAGNOSIS — Z8673 Personal history of transient ischemic attack (TIA), and cerebral infarction without residual deficits: Secondary | ICD-10-CM | POA: Diagnosis not present

## 2024-01-20 DIAGNOSIS — G459 Transient cerebral ischemic attack, unspecified: Secondary | ICD-10-CM

## 2024-01-20 DIAGNOSIS — E7800 Pure hypercholesterolemia, unspecified: Secondary | ICD-10-CM

## 2024-01-20 DIAGNOSIS — Z95818 Presence of other cardiac implants and grafts: Secondary | ICD-10-CM

## 2024-01-20 DIAGNOSIS — E1165 Type 2 diabetes mellitus with hyperglycemia: Secondary | ICD-10-CM

## 2024-01-20 DIAGNOSIS — Z794 Long term (current) use of insulin: Secondary | ICD-10-CM

## 2024-01-20 NOTE — Telephone Encounter (Signed)
 Pt called an informed that Dr Festus Hubert said that it was ok to stop the clopidogrel  7 days prior  to surgery

## 2024-01-20 NOTE — Telephone Encounter (Signed)
   Name: Ebony Matthews  DOB: 1981-04-11  MRN: [id number]  Primary Cardiologist: None  Chart reviewed as part of pre-operative protocol coverage. The patient has an upcoming visit scheduled with Dr. Albert Huff on 01/20/2024 at which time clearance can be addressed in case there are any issues that would impact surgical recommendations.  ROBOTIC ASSISTED LAPAROSCOPIC UNILATERAL SALPINGO-OOPHORECTOMY POSSIBLE TOTAL HYSTERECTOMY, POSSIBLE STAGING MINI LAPAROTOMY, POSSIBLE BILATERAL SALPINGO-OOPHORECTOMY is not scheduled until 02/02/2024 as below. I added preop FYI to appointment note so that provider is aware to address at time of outpatient visit.  Per office protocol the cardiology provider should forward their finalized clearance decision and recommendations regarding antiplatelet therapy to the requesting party below.    I will route this message as FYI to requesting party and remove this message from the preop box as separate preop APP input not needed at this time.   Please call with any questions.  Jude Norton, NP  01/20/2024, 7:48 AM

## 2024-01-20 NOTE — Patient Instructions (Addendum)
 Medication Instructions:  Your physician has recommended you make the following change in your medication:   STOP Metoprolol  Succinate (Toprol -XL)  START Metoprolol  Tartrate (Lopressor ) 25 mg twice daily    START Losartan  25 mg once daily   *If you need a refill on your cardiac medications before your next appointment, please call your pharmacy*  Lab Work: To be completed in 1 week: BMP  If you have labs (blood work) drawn today and your tests are completely normal, you will receive your results only by: MyChart Message (if you have MyChart) OR A paper copy in the mail If you have any lab test that is abnormal or we need to change your treatment, we will call you to review the results.  Testing/Procedures: Your physician has requested that you have coronary CTA. Non-Cardiac CT Angiography (CTA), is a special type of CT scan that uses a computer to produce multi-dimensional views of major blood vessels throughout the body. In CT angiography, a contrast material is injected through an IV to help visualize the blood vessels  Your physician has requested that you have an echocardiogram. Echocardiography is a painless test that uses sound waves to create images of your heart. It provides your doctor with information about the size and shape of your heart and how well your heart's chambers and valves are working. This procedure takes approximately one hour. There are no restrictions for this procedure. Please do NOT wear cologne, perfume, aftershave, or lotions (deodorant is allowed). Please arrive 15 minutes prior to your appointment time.  Please note: We ask at that you not bring children with you during ultrasound (echo/ vascular) testing. Due to room size and safety concerns, children are not allowed in the ultrasound rooms during exams. Our front office staff cannot provide observation of children in our lobby area while testing is being conducted. An adult accompanying a patient to their  appointment will only be allowed in the ultrasound room at the discretion of the ultrasound technician under special circumstances. We apologize for any inconvenience.     Your cardiac CT will be scheduled at one of the below locations:   [facility] [address] [place], [place] [address] [phone]   If scheduled at [facility], please arrive at the [facility] and Children's Entrance (Entrance C2) of [facility] 30 minutes prior to test start time. You can use the FREE valet parking offered at entrance C (encouraged to control the heart rate for the test)  Proceed to the [facility] Radiology Department (first floor) to check-in and test prep.   All radiology patients and guests should use entrance C2 at [facility], accessed from [facility], even though the hospital's physical address listed is [address]    If scheduled at the Heart and Vascular Tower at [address], please enter the parking lot using the [address] entrance and use the FREE valet service at the patient drop-off area. Enter the buidling and check-in with registration on the main floor.  If scheduled at [facility] or [facility], please arrive 15 mins early for check-in and test prep.  There is spacious parking and easy access to the radiology department from the [facility] Heart and Vascular entrance. Please enter here and check-in with the desk attendant.   If scheduled at [facility], please arrive 30 minutes early for check-in and test prep.  Please follow these instructions carefully (unless otherwise directed):  An IV will be required for this test and Nitroglycerin  will be given.  Hold all erectile dysfunction medications at least 3 days (72 hrs) prior to test. (Ie viagra, cialis, sildenafil, tadalafil, etc)   On the Night Before the Test: Be sure to Drink plenty of water. Do not  consume any caffeinated/decaffeinated beverages or chocolate 12 hours prior to your test. Do not take any antihistamines 12 hours prior to your test.   On the Day of the Test: Drink plenty of water until 1 hour prior to the test. Do not eat any food 1 hour prior to test. You may take your regular medications prior to the test.  Take metoprolol  (Lopressor ) two hours prior to test. If you take Furosemide/Hydrochlorothiazide /Spironolactone/Chlorthalidone, please HOLD on the morning of the test. Patients who wear a continuous glucose monitor MUST remove the device prior to scanning. FEMALES- please wear underwire-free bra if available, avoid dresses & tight clothing      After the Test: Drink plenty of water. After receiving IV contrast, you may experience a mild flushed feeling. This is normal. On occasion, you may experience a mild rash up to 24 hours after the test. This is not dangerous. If this occurs, you can take Benadryl 25 mg, Zyrtec, Claritin, or Allegra and increase your fluid intake. (Patients taking Tikosyn should avoid Benadryl, and may take Zyrtec, Claritin, or Allegra) If you experience trouble breathing, this can be serious. If it is severe call 911 IMMEDIATELY. If it is mild, please call our office.  We will call to schedule your test 2-4 weeks out understanding that some insurance companies will need an authorization prior to the service being performed.   For more information and frequently asked questions, please visit our website : [email or web address]  For non-scheduling related questions, please contact the cardiac imaging nurse navigator should you have any questions/concerns: Cardiac Imaging Nurse Navigators Direct Office Dial: [phone]   For scheduling needs, including cancellations and rescheduling, please call [place], [phone].   Follow-Up: At [facility], you and your health needs are our priority.  As part of our continuing mission to  provide you with exceptional heart care, we have created designated Provider Care Teams.  These Care Teams include your primary Cardiologist (physician) and Advanced Practice Providers (APPs -  Physician Assistants and Nurse Practitioners) who all work together to provide you with the care you need, when you need it.  Your next appointment:   6 month(s)  The format for your next appointment:   In Person  Provider:   Olinda Bertrand, [facility]  Other Instructions Please contact neurology to discuss holding your Plavix  for the procedure.    1st Floor: - Lobby - Registration  - Pharmacy  - Lab - Cafe  2nd Floor: - PV Lab - Diagnostic Testing (echo, CT, nuclear med)  3rd Floor: - Vacant  4th Floor: - TCTS (cardiothoracic surgery) - AFib Clinic - Structural Heart Clinic - Vascular Surgery  - Vascular Ultrasound  5th Floor: - HeartCare Cardiology (general and EP) - Clinical Pharmacy for coumadin, hypertension, lipid, weight-loss medications, and med management appointments    Valet parking services will be available as well.

## 2024-01-20 NOTE — Progress Notes (Unsigned)
 Cardiology Office Note:  .   ID:  Ebony Matthews, DOB 18-Apr-1981, MRN [id number] PCP:  Ulysees Gander, MD  Former Cardiology Providers: [facility]. [facility] Providers Cardiologist:  Olinda Bertrand, DO , [facility] (established care 11/30/2020) Electrophysiologist:  None  Click to update primary MD,subspecialty MD or APP then REFRESH:1}    Chief Complaint  Patient presents with   Follow-up    Preop     History of Present Illness: .   Ebony Matthews is a 43 y.o. African-American female whose past medical history and cardiovascular risk factors includes: History of multiple strokes with residual deficits, right ICA occlusion, hypertension, diabetes, history of cocaine use, former smoker.   Patient establish care with our practice back in 2022 after undergoing multiple strokes felt to be cryptogenic in etiology.  After second stroke patient underwent loop recorder implant for monitoring of atrial fibrillation.  She was last seen in the office on May 21, 2023 & now presents for follow-up as well as preoperative risk stratification.  Patient is being considered for robotic assisted unilateral salpingo-oophorectomy, possible total hysterectomy, possible staging with mini laparotomy, possible bilateral salpingo-oophorectomy.  Surgery scheduled for May 13th 2025 with Dr. Daisey Dryer.  Denies anginal chest pain. No heart failure symptoms. Has had history of multiple strokes, insulin -dependent diabetic, and overall functional capacity at baseline is less than 4 METS.   Review of Systems: .   Review of Systems  Cardiovascular:  Negative for chest pain, claudication, irregular heartbeat, leg swelling, near-syncope, orthopnea, palpitations, paroxysmal nocturnal dyspnea and syncope.  Respiratory:  Negative for shortness of breath.   Hematologic/Lymphatic: Negative for bleeding problem.    Studies Reviewed:   EKG: EKG Interpretation Date/Time:  Wednesday January 20 2024 09:45:55  EDT Ventricular Rate:  73 PR Interval:  140 QRS Duration:  64 QT Interval:  366 QTC Calculation: 403 R Axis:   7  Text Interpretation: Normal sinus rhythm Normal ECG When compared with ECG of 15-Apr-2020 17:01, Criteria for Septal infarct are no longer Present ST no longer depressed in Inferior leads T wave inversion less evident in Inferior leads T wave inversion no longer evident in Lateral leads Confirmed by Olinda Bertrand [phone]) on 01/20/2024 9:48:36 AM  Echocardiogram: 05/15/2023:  Left ventricle cavity is normal in size. Mild concentric hypertrophy of the left ventricle. Normal global wall motion. Normal LV systolic function with EF 67%. Doppler evidence of grade I (impaired) diastolic dysfunction, normal LAP.  Left atrial cavity is moderately dilated.  Mild (Grade I) mitral regurgitation.  Mild tricuspid regurgitation.  Mild pulmonic regurgitation.  No evidence of pulmonary hypertension.  Previous study on 05/30/2022 reported moderate to severe LA dilatation, mod TR.   Stress Testing: Lexiscan  Sestamibi stress test 12/10/2020: Abnormal nuclear stress test: 1 Day Rest/Stress Protocol. Stress EKG is non-diagnostic, as this is pharmacological stress test using Lexiscan . Medium size, moderate intensity, reversible perfusion defect suggestive of ischemia in the LCx distribution. Small size, moderate intensity, reversible perfusion defect suggestive of ischemia in the LAD distribution. Small size, fixed perfusion defect involving the basal to mid inferior segments most likely secondary to soft tissue attenuation artifact with possible small degree of reversible ischemia involving the basal to mid inferoseptal segments cannot be ruled out. Calculated LVEF 64% without regional wall motion abnormalities. Clinical correlation required.  Carotid artery duplex 01/23/2023:  Duplex suggests stenosis in the right internal carotid artery (total  occlusion). <50%stenosis in the right external  carotid artery.  Duplex suggests stenosis in the left internal  carotid artery (minimal).  Homogeneous plaque bilateral carotid arteries.  Follow up in four months is appropriate if clinically indicated.  Antegrade right vertebral artery flow. Antegrade left vertebral artery flow.  No significant change from 01/22/2021. Follow up in 1 year months is  appropriate if clinically indicated.   RADIOLOGY: Ct Angio Head W Or Wo Contrast Result Date: 01/22/2019 CLINICAL DATA:  Right facial droop, slurred speech, right arm numbness. Diabetes and hypertension EXAM: CT ANGIOGRAPHY HEAD AND NECK TECHNIQUE: Multidetector CT imaging of the head and neck was performed using the standard protocol during bolus administration of intravenous contrast. Multiplanar CT image reconstructions and MIPs were obtained to evaluate the vascular anatomy. Carotid stenosis measurements (when applicable) are obtained utilizing NASCET criteria, using the distal internal carotid diameter as the denominator. CONTRAST:  75mL OMNIPAQUE  IOHEXOL  350 MG/ML SOLN COMPARISON:  None. FINDINGS: CT HEAD FINDINGS Brain: No evidence of acute infarction, hemorrhage, hydrocephalus, extra-axial collection or mass lesion/mass effect. Vascular: Negative for hyperdense vessel Skull: Negative Sinuses: Negative Orbits: Negative Review of the MIP images confirms the above findings CTA NECK FINDINGS Aortic arch: Standard branching. Imaged portion shows no evidence of aneurysm or dissection. No significant stenosis of the major arch vessel origins. Right carotid system: Right carotid bifurcation widely patent. Mild fusiform dilatation of the distal right internal carotid artery below the skull base with mild irregularity, question mild FMD. No significant stenosis Left carotid system: Similar findings to the right carotid with mild fusiform dilatation of the internal carotid artery below the skull base. Mild narrowing at the origin of the left internal carotid  artery. No calcification or aneurysm. No significant stenosis. Vertebral arteries: Both vertebral arteries widely patent to the basilar without stenosis. Skeleton: No acute skeletal abnormality. Poor dentition with numerous caries. Other neck: Negative for soft tissue mass or adenopathy. Upper chest: Negative Review of the MIP images confirms the above findings CTA HEAD FINDINGS Anterior circulation: Mild narrowing of the right cavernous carotid compared to the right internal carotid artery below the skull base. Right anterior and middle cerebral arteries widely patent Diffuse narrowing left cavernous carotid with mild-to-moderate focal stenosis proximal cavernous carotid. Left anterior and middle cerebral arteries widely patent. Posterior circulation: Both vertebral arteries patent to the basilar. PICA patent bilaterally. Basilar widely patent. Superior cerebellar and posterior cerebral arteries widely patent. Venous sinuses: Patent Anatomic variants: None Delayed phase: Normal enhancement postcontrast administration. Review of the MIP images confirms the above findings IMPRESSION: 1. Negative CT head 2. Negative for emergent large vessel occlusion 3. Mild fusiform dilatation of the internal carotid artery below the skull base with subsequent narrowing to the cavernous carotid bilaterally. Question FMD. No intracranial aneurysm. 4. These results were called by telephone at the time of interpretation on 01/22/2019 at 9:36 pm to Dr. Kimberley Penman , who verbally acknowledged these results. Electronically Signed   By: Anastasio Balsam M.D.   On: 01/22/2019 21:36     Mr Brain Wo Contrast Result Date: 01/23/2019 IMPRESSION:  1. Multifocal acute ischemia, predominantly in the left hemisphere, although there is a single focus within the right frontal white matter. Punctate ischemic foci along the left motor strip are compatible with reported right upper extremity symptoms.  2. No hemorrhage or mass effect.  3. Mild  findings of chronic ischemic microangiopathy.   MRI brain without contrast 03/03/2020: 1. Scattered multifocal subcentimeter acute ischemic infarcts involving the right cerebral hemisphere as above. Few additional subcentimeter infarcts involve the left occipital lobe and inferior right cerebellum. No associated  hemorrhage or mass effect. 2. Small chronic infarct involving the right middle cerebellar peduncle, chronic in appearance, but new as compared to previous. 3. Underlying mild to moderate chronic microvascular ischemic disease, mildly progressed from previous.    CTA neck with and without contrast: 04/15/2020. 1. Acute proximal right ICA occlusion, new from previous. Distal reconstitution at the supraclinoid segment via collateral flow across the circle-of-Willis. Right MCA is perfused although attenuated as compared to the contralateral left. 2. Otherwise stable CTA of the head and neck. No other large vessel occlusion, hemodynamically significant stenosis, or other acute vascular abnormality. 3. 4 mm left upper lobe nodule, indeterminate. No follow-up needed if patient is low-risk. Non-contrast chest CT can be considered in 12 months if patient is high-risk. This recommendation follows the consensus statement: Guidelines for Management of Incidental Pulmonary Nodules Detected on CT Images: From the Fleischner Society 2017; Radiology 2017; 284:228-243.   MRI brain with and without contrast 04/15/2020: Multiple new acute infarcts in the right cerebral hemisphere. Small acute infarct of the right pons.  Risk Assessment/Calculations:   NA   Labs:       Latest Ref Rng & Units 12/28/2023    1:02 PM 11/12/2023    5:14 AM 11/11/2023    4:20 AM  CBC  WBC 4.0 - 10.5 K/uL 6.3  6.1  6.8   Hemoglobin 12.0 - 15.0 g/dL 16.1  9.4  9.7   Hematocrit 36.0 - 46.0 % 32.4  29.9  30.5   Platelets 150 - 400 K/uL 271  301  287        Latest Ref Rng & Units 12/28/2023    1:02 PM 11/14/2023    5:33 AM  11/13/2023    6:45 AM  BMP  Glucose 70 - 99 mg/dL 096  045  409   BUN 6 - 20 mg/dL 21  40  37   Creatinine 0.44 - 1.00 mg/dL 8.11  9.14  7.82   Sodium 135 - 145 mmol/L 141  136  138   Potassium 3.5 - 5.1 mmol/L 3.5  3.9  4.2   Chloride 98 - 111 mmol/L 107  102  104   CO2 22 - 32 mmol/L 26  23  22    Calcium  8.9 - 10.3 mg/dL 9.4  8.7  8.9       Latest Ref Rng & Units 12/28/2023    1:02 PM 11/14/2023    5:33 AM 11/13/2023    6:45 AM  CMP  Glucose 70 - 99 mg/dL 956  213  086   BUN 6 - 20 mg/dL 21  40  37   Creatinine 0.44 - 1.00 mg/dL 5.78  4.69  6.29   Sodium 135 - 145 mmol/L 141  136  138   Potassium 3.5 - 5.1 mmol/L 3.5  3.9  4.2   Chloride 98 - 111 mmol/L 107  102  104   CO2 22 - 32 mmol/L 26  23  22    Calcium  8.9 - 10.3 mg/dL 9.4  8.7  8.9   Total Protein 6.5 - 8.1 g/dL 7.6  6.0  6.0   Total Bilirubin 0.0 - 1.2 mg/dL 0.4  0.6  0.6   Alkaline Phos 38 - 126 U/L 70  50  52   AST 15 - 41 U/L 12  109  149   ALT 0 - 44 U/L 14  171  191     External Labs:   Collected: 11/28/2020 provided by PCP during the office visit  Creatinine 0.81 mg/dL. eGFR: 105 mL/min per 1.73 m Lipid profile: Total cholesterol 122, triglycerides 91, HDL 34, LDL 71, non-HDL 88 Sodium 161, potassium 4.6, chloride 103, bicarbonate 24, AST 18, ALT 35, alkaline phosphatase 76 Hemoglobin 12.3 g/dL, hematocrit 09.6% Hemoglobin A1c: 8  External Labs: Collected: 11/12/2021 [facility] database Total cholesterol 148, triglycerides 95, HDL 41, LDL 88.   Physical Exam:    Today's Vitals   01/20/24 0942  BP: (!) 160/98  Pulse: 73  Resp: 16  SpO2: 95%  Weight: 214 lb 9.6 oz (97.3 kg)  Height: 5\' 7"  (1.702 m)   Body mass index is 33.61 kg/m. Wt Readings from Last 3 Encounters:  01/20/24 214 lb 9.6 oz (97.3 kg)  12/29/23 207 lb (93.9 kg)  12/28/23 206 lb 6.4 oz (93.6 kg)    Physical Exam  Constitutional: No distress. She appears chronically ill.  hemodynamically stable, walks w/ walker  Neck: No JVD present.   Cardiovascular: Normal rate, regular rhythm, S1 normal and S2 normal. Exam reveals no gallop, no S3 and no S4.  No murmur heard. Pulmonary/Chest: Effort normal and breath sounds normal. No stridor. She has no wheezes. She has no rales.  Musculoskeletal:        General: No edema.     Cervical back: Neck supple.     Comments: Left lower arm in brace  Skin: Skin is warm.     Impression & Recommendation(s):  Impression:   ICD-10-CM   1. Preoperative clearance  Z01.818 EKG 12-Lead    CT CORONARY MORPH W/CTA COR W/SCORE W/CA W/CM &/OR WO/CM    ECHOCARDIOGRAM COMPLETE    2. History of stroke  Z86.73     3. TIA (transient ischemic attack)  G45.9     4. Hx of right proximal ICA occlusion  I63.231     5. Loop recorder Biotronic 12/14/2020  Z95.818     6. Essential hypertension, benign  I10 Basic metabolic panel with GFR    Basic metabolic panel with GFR    7. Hypercholesteremia  E78.00     8. Type 2 diabetes mellitus with hyperglycemia, with long-term current use of insulin  (HCC)  E11.65    Z79.4        Recommendation(s):  Preoperative clearance Patient is being considered for robotic assisted unilateral salpingo-oophorectomy with possible total hysterectomy in May 2025.  Referred to the practice for preoperative risk stratification. Denies anginal chest pain or heart failure symptoms. Overall functional capacity is limited due to her multiple strokes in the past. Patient is insulin -dependent diabetic. Has had what appeared to be an abnormal nuclear stress test back in 2022 but since then with medical therapy has done well. Echo will be ordered to evaluate for structural heart disease and left ventricular systolic function. Given the fact that she does not do at least 4 METS of activity recommend a coronary CTA for further risk stratification. Will start losartan  25 mg p.o. daily for better blood pressure management prior to elective surgery.  Further medication titration may be  warranted but she will follow-up with PCP. According to the Revised Cardiac Risk Index (RCRI), her Perioperative Risk of Major Cardiac Event is (%): 6.6 Her Functional Capacity in METs is: 3.97 according to the Duke Activity Status Index (DASI).  History of CVA with left sided deficits TIA (transient ischemic attack) Hx of right proximal ICA occlusion Loop recorder Biotronic 12/14/2020 No reoccurrence of new stroke since last office visit. Most recent ILR report from April 2025 reviewed no A-fib  episodes Reemphasized the importance of secondary prevention with focus on improving her modifiable cardiovascular risk factors such as glycemic control, lipid management, blood pressure control, weight loss.  Essential hypertension, benign Office blood pressures are not well-controlled Start losartan  25 mg p.o. daily BMP in one week to check renal function and electrolytes Reemphasized importance of low-salt diet.  Hypercholesteremia Continue rosuvastatin 40 mg p.o. nightly. Patient states that her lipids are being checked and managed by PCP Will defer management to PCP unless if I hear otherwise.  Type 2 diabetes mellitus with hyperglycemia, with long-term current use of insulin  (HCC) Reemphasized importance of glycemic control. Currently on Humalog. Will start losartan  as discussed above. Continue current dose of Crestor.   Orders Placed:  Orders Placed This Encounter  Procedures   CT CORONARY MORPH W/CTA COR W/SCORE W/CA W/CM &/OR WO/CM    Standing Status:   Future    Expiration Date:   01/19/2025    If indicated for the ordered procedure, I authorize the administration of contrast media per Radiology protocol:   Yes    Initiate Coronary CTA Adult Protocol:   Yes    If indicated initiate Post Coronary CTA Hypotension Adult Protocol:   Yes    Does the patient have a contrast media/X-ray dye allergy?:   No    Is patient pregnant?:   No    Preferred Imaging Location?:   [facility]    Authorization::   FFR will be ordered if deemed medically necessary   Basic metabolic panel with GFR    Standing Status:   Future    Number of Occurrences:   1    Expected Date:   01/27/2024    Expiration Date:   01/19/2025   EKG 12-Lead   ECHOCARDIOGRAM COMPLETE    Standing Status:   Future    Number of Occurrences:   1    Expected Date:   01/27/2024    Expiration Date:   01/19/2025    Where should this test be performed:   [facility])    Does the patient weigh less than or greater than 250 lbs?:   Patient weighs less than 250 lbs    Perflutren DEFINITY (image enhancing agent) should be administered unless hypersensitivity or allergy exist:   Administer Perflutren    Reason for exam-Echo:   Pre-operative cardiovascular examination Z01.810     Final Medication List:   No orders of the defined types were placed in this encounter.   Medications Discontinued During This Encounter  Medication Reason   metoprolol  tartrate (LOPRESSOR ) 25 MG tablet Change in therapy     Current Outpatient Medications:    BD PEN NEEDLE MICRO U/F 32G X 6 MM MISC, Inject into the skin as directed., Disp: , Rfl:    clopidogrel  (PLAVIX ) 75 MG tablet, Take 1 tablet (75 mg total) by mouth daily. (Patient not taking: Reported on 01/21/2024), Disp: 30 tablet, Rfl: 0   cyanocobalamin  (VITAMIN B12) 1000 MCG/ML injection, 1000 mcg SQ weekly x 4 weeks, then 1000 mcg SQ monthly, Disp: , Rfl:    folic acid  (FOLVITE ) 1 MG tablet, Take 1 tablet (1 mg total) by mouth daily. (Patient not taking: Reported on 01/21/2024), Disp: , Rfl:    metoprolol  succinate (TOPROL -XL) 25 MG 24 hr tablet, Take 25 mg by mouth daily., Disp: , Rfl:    nitroGLYCERIN  (NITROSTAT ) 0.4 MG SL tablet, Place 1 tablet (0.4 mg total) under the tongue every 5 (five) minutes as  needed for chest pain. If you require more than two tablets five minutes apart go to the nearest ER via EMS., Disp: 30 tablet, Rfl: 0   rosuvastatin  (CRESTOR) 40 MG tablet, Take 40 mg by mouth at bedtime. (Patient not taking: Reported on 01/21/2024), Disp: , Rfl:    insulin  aspart (NOVOLOG  FLEXPEN) 100 UNIT/ML FlexPen, 0-9 Units, Subcutaneous, 3 times daily with meals CBG < 70: Implement Hypoglycemia measures CBG 70 - 120: 0 units CBG 121 - 150: 1 unit CBG 151 - 200: 2 units CBG 201 - 250: 3 units CBG 251 - 300: 5 units CBG 301 - 350: 7 units CBG 351 - 400: 9 units CBG > 400: call MD, Disp: , Rfl:    insulin  lispro (HUMALOG) 100 UNIT/ML KwikPen, INJECT AS PER SLIDING SCALE: IF 0-120 = 0 UNITS; 121-150 = 1 UNIT; 151-200 =2 UNITS; 201-250 = 3 UNITS; 251-300 = 5 UNITS; 301-350 = 7 UNITS; 351-400 = 9 UNITS OVER 401 CALL MD FOR FURTHER ORDERS. INJECT SUBCUTANEOUSLY WITH MEALS RELATED TO TYPE DIABETES, Disp: , Rfl:    losartan  (COZAAR ) 25 MG tablet, Take 1 tablet (25 mg total) by mouth daily., Disp: 30 tablet, Rfl: 3  Consent:   NA  Disposition:   6 months follow-up sooner if needed.  Her questions and concerns were addressed to her satisfaction. She voices understanding of the recommendations provided during this encounter.    Signed, Olinda Bertrand, DO, [facility]  [facility]  [address] [place], [place] [address] 01/22/2024 10:46 AM

## 2024-01-20 NOTE — Telephone Encounter (Signed)
 Left message with the after hour service on 01-20-24 at 12:13 pm   Caller needs to know if she can stop taking medication 7 days prior to surgery please call   Medication is clopidogrel 

## 2024-01-21 ENCOUNTER — Other Ambulatory Visit: Payer: Self-pay

## 2024-01-21 ENCOUNTER — Telehealth: Payer: Self-pay | Admitting: Neurology

## 2024-01-21 ENCOUNTER — Encounter: Payer: Self-pay | Admitting: Cardiology

## 2024-01-21 ENCOUNTER — Ambulatory Visit (HOSPITAL_COMMUNITY): Attending: Cardiology

## 2024-01-21 DIAGNOSIS — Z01810 Encounter for preprocedural cardiovascular examination: Secondary | ICD-10-CM | POA: Insufficient documentation

## 2024-01-21 DIAGNOSIS — E119 Type 2 diabetes mellitus without complications: Secondary | ICD-10-CM | POA: Diagnosis not present

## 2024-01-21 DIAGNOSIS — I10 Essential (primary) hypertension: Secondary | ICD-10-CM | POA: Diagnosis not present

## 2024-01-21 DIAGNOSIS — E785 Hyperlipidemia, unspecified: Secondary | ICD-10-CM | POA: Diagnosis not present

## 2024-01-21 DIAGNOSIS — Z8673 Personal history of transient ischemic attack (TIA), and cerebral infarction without residual deficits: Secondary | ICD-10-CM | POA: Insufficient documentation

## 2024-01-21 DIAGNOSIS — Z01818 Encounter for other preprocedural examination: Secondary | ICD-10-CM

## 2024-01-21 LAB — ECHOCARDIOGRAM COMPLETE
Area-P 1/2: 3.81 cm2
S' Lateral: 2.7 cm

## 2024-01-21 MED ORDER — LOSARTAN POTASSIUM 25 MG PO TABS: 25.0000 mg | ORAL_TABLET | Freq: Every day | ORAL | 3 refills | Status: DC

## 2024-01-21 NOTE — Telephone Encounter (Signed)
 GYN Oncology called in stating they got the clearance form from Dr. Festus Hubert that was signed, but he did not check if the patient was cleared for surgery or not. They need it faxed back to them.

## 2024-01-22 ENCOUNTER — Encounter: Payer: Self-pay | Admitting: Cardiology

## 2024-01-22 ENCOUNTER — Other Ambulatory Visit: Payer: Self-pay | Admitting: Cardiology

## 2024-01-22 NOTE — Telephone Encounter (Signed)
 Paperwork faxed over to fax number on form.

## 2024-01-25 ENCOUNTER — Other Ambulatory Visit: Payer: Self-pay

## 2024-01-25 ENCOUNTER — Inpatient Hospital Stay: Admitting: Gynecologic Oncology

## 2024-01-25 DIAGNOSIS — N838 Other noninflammatory disorders of ovary, fallopian tube and broad ligament: Secondary | ICD-10-CM

## 2024-01-25 MED ORDER — METOPROLOL TARTRATE 25 MG PO TABS: 25.0000 mg | ORAL_TABLET | Freq: Two times a day (BID) | ORAL | 3 refills | Status: AC

## 2024-01-25 MED ORDER — METOPROLOL TARTRATE 25 MG PO TABS: 25.0000 mg | ORAL_TABLET | Freq: Two times a day (BID) | ORAL | 3 refills | Status: DC

## 2024-01-26 ENCOUNTER — Telehealth: Payer: Self-pay | Admitting: *Deleted

## 2024-01-26 ENCOUNTER — Encounter: Admitting: Gynecologic Oncology

## 2024-01-26 NOTE — Telephone Encounter (Signed)
 Called [facility] at Dr. Jesus Morones office and left a message for scheduler [place] in regards to patient's cardiac CT that is scheduled for May 12 th a day before pt's surgery with Dr. Daisey Dryer. Will this be enough time to have patient's result read prior to her surgery? LVM requesting call back to [phone].

## 2024-01-27 LAB — BASIC METABOLIC PANEL WITH GFR
BUN/Creatinine Ratio: 14 (ref 9–23)
BUN: 19 mg/dL (ref 6–24)
CO2: 21 mmol/L (ref 20–29)
Calcium: 9.4 mg/dL (ref 8.7–10.2)
Chloride: 104 mmol/L (ref 96–106)
Creatinine, Ser: 1.33 mg/dL — ABNORMAL HIGH (ref 0.57–1.00)
Glucose: 190 mg/dL — ABNORMAL HIGH (ref 70–99)
Potassium: 4.2 mmol/L (ref 3.5–5.2)
Sodium: 141 mmol/L (ref 134–144)
eGFR: 51 mL/min/{1.73_m2} — ABNORMAL LOW (ref >59)

## 2024-01-28 ENCOUNTER — Telehealth: Payer: Self-pay | Admitting: Cardiology

## 2024-01-28 ENCOUNTER — Encounter: Payer: Self-pay | Admitting: Cardiology

## 2024-01-28 NOTE — Telephone Encounter (Signed)
 Ebony Matthews is calling to see if the CT scan that is being done on 5/12 can be done an earlier because her surgery is schedule 5/13. Please advise

## 2024-01-28 NOTE — Telephone Encounter (Signed)
 Spoke with Ebony Matthews at [facility] at [phone] in regards to patient's scheduled Cardiac CT Scan on May 12 with her surgery scheduled on May 13 th with Dr. Daisey Dryer. Will this be enough time to have CT scan read prior to surgery on the 13th?[name] advised she would forward message to Dr. Jesus Morones team and [facility] Care office will return a call.

## 2024-01-29 ENCOUNTER — Telehealth: Payer: Self-pay | Admitting: *Deleted

## 2024-01-29 ENCOUNTER — Encounter (HOSPITAL_COMMUNITY): Payer: Self-pay

## 2024-01-29 NOTE — Telephone Encounter (Signed)
 Attempted to reach patient and left a voicemail regarding her pre-op appt.with the nurse navigator on Thursday, May 8th at 1100 after pre-admission appt. At 1000 at [facility]. Requesting call back to [phone].

## 2024-02-01 ENCOUNTER — Ambulatory Visit: Admitting: Gynecologic Oncology

## 2024-02-01 DIAGNOSIS — N838 Other noninflammatory disorders of ovary, fallopian tube and broad ligament: Secondary | ICD-10-CM

## 2024-02-01 NOTE — Telephone Encounter (Signed)
 Spoke with Ebony Matthews  and reminded patient of her upcoming appts. On Thursday, May 8th. with [facility] pre-admission at 1000 and Pre-op with Mariah Shines, nurse navigator. Pt was also reminded that her Cardiac CT scan was moved up from May 12 th to tomorrow May 6 at 3 pm with [facility]. Pt verbalized understanding and thanked the office for calling.

## 2024-02-02 ENCOUNTER — Ambulatory Visit (HOSPITAL_COMMUNITY)
Admission: RE | Admit: 2024-02-02 | Discharge: 2024-02-02 | Disposition: A | Discharge status: Home / Self Care | Source: Ambulatory Visit | Attending: Cardiology | Admitting: Cardiology

## 2024-02-02 DIAGNOSIS — I2510 Atherosclerotic heart disease of native coronary artery without angina pectoris: Secondary | ICD-10-CM | POA: Diagnosis not present

## 2024-02-02 DIAGNOSIS — Z01818 Encounter for other preprocedural examination: Secondary | ICD-10-CM | POA: Insufficient documentation

## 2024-02-02 DIAGNOSIS — R931 Abnormal findings on diagnostic imaging of heart and coronary circulation: Secondary | ICD-10-CM | POA: Insufficient documentation

## 2024-02-02 DIAGNOSIS — I209 Angina pectoris, unspecified: Secondary | ICD-10-CM | POA: Diagnosis present

## 2024-02-02 MED ORDER — NITROGLYCERIN 0.4 MG SL SUBL
0.8000 mg | SUBLINGUAL_TABLET | Freq: Once | SUBLINGUAL | Status: AC
Start: 2024-02-02 — End: 2024-02-02
  Administered 2024-02-02: 0.8 mg via SUBLINGUAL

## 2024-02-02 MED ORDER — IOHEXOL 350 MG/ML SOLN
100.0000 mL | Freq: Once | INTRAVENOUS | Status: AC | PRN
  Administered 2024-02-02: 100 mL via INTRAVENOUS

## 2024-02-03 ENCOUNTER — Ambulatory Visit (HOSPITAL_BASED_OUTPATIENT_CLINIC_OR_DEPARTMENT_OTHER)
Admission: RE | Admit: 2024-02-03 | Discharge: 2024-02-03 | Disposition: A | Discharge status: Home / Self Care | Source: Ambulatory Visit | Attending: Cardiology | Admitting: Cardiology

## 2024-02-03 ENCOUNTER — Other Ambulatory Visit: Payer: Self-pay | Admitting: Cardiology

## 2024-02-03 DIAGNOSIS — R931 Abnormal findings on diagnostic imaging of heart and coronary circulation: Secondary | ICD-10-CM | POA: Diagnosis not present

## 2024-02-03 DIAGNOSIS — Z01818 Encounter for other preprocedural examination: Secondary | ICD-10-CM | POA: Diagnosis not present

## 2024-02-03 NOTE — Progress Notes (Signed)
 COVID Vaccine Completed: yes  Date of COVID positive in last 90 days:  PCP - Adeline Hone, MD Cardiologist -  Sunit Tolia, DO LOV 01/20/24 Neurologist- Janne Members, DO  Coronary CT- 02/02/24 Epic Chest x-ray - n/a EKG - 01/20/24 Epic Stress Test - n/a ECHO - 01/21/24 Epic Cardiac Cath - n/a Pacemaker/ICD device last checked: loop recorder checked 01/04/24 Epic Spinal Cord Stimulator: n/a  Bowel Prep - no  Sleep Study - n/a CPAP -   Fasting Blood Sugar - 180s Checks Blood Sugar has CGM  Last dose of GLP1 agonist-  N/A GLP1 instructions:  Hold 7 days before surgery    Last dose of SGLT-2 inhibitors-  N/A SGLT-2 instructions:  Hold 3 days before surgery    Blood Thinner Instructions:  Last dose:   Time: Aspirin  Instructions: Last Dose:  Activity level: Can go up a flight of stairs and perform activities of daily living without stopping and without symptoms of chest pain or shortness of breath. No stairs. Ambulates with Rolator PRN  Anesthesia review: HTN, TIA, DM2, loop recorder, CKD4, hemiparesis left side, BP 153/108 at PAT. Patient had a recent med change. Instructed to monitor at home and reach out to PCP. A1C 8.3  Patient denies shortness of breath, fever, cough and chest pain at PAT appointment  Patient verbalized understanding of instructions that were given to them at the PAT appointment. Patient was also instructed that they will need to review over the PAT instructions again at home before surgery.

## 2024-02-03 NOTE — Patient Instructions (Signed)
 SURGICAL WAITING ROOM VISITATION  Patients having surgery or a procedure may have no more than 2 support people in the waiting area - these visitors may rotate.    Children under the age of 18 must have an adult with them who is not the patient.  Due to an increase in RSV and influenza rates and associated hospitalizations, children ages 68 and under may not visit patients in [facility] hospitals.  Visitors with respiratory illnesses are discouraged from visiting and should remain at home.  If the patient needs to stay at the hospital during part of their recovery, the visitor guidelines for inpatient rooms apply. Pre-op nurse will coordinate an appropriate time for 1 support person to accompany patient in pre-op.  This support person may not rotate.    Please refer to the [facility] website for the visitor guidelines for Inpatients (after your surgery is over and you are in a regular room).    Your procedure is scheduled on: 02/09/24   Report to [facility] Main Entrance    Report to admitting at 6:30 AM   Call this number if you have problems the morning of surgery [phone]   Do not eat food :After Midnight.   After Midnight you may have the following liquids until 5:45 AM DAY OF SURGERY  Water Non-Citrus Juices (without pulp, NO RED-Apple, White grape, White cranberry) Black Coffee (NO MILK/CREAM OR CREAMERS, sugar ok)  Clear Tea (NO MILK/CREAM OR CREAMERS, sugar ok) regular and decaf                             Plain Jell-O (NO RED)                                           Fruit ices (not with fruit pulp, NO RED)                                     Popsicles (NO RED)                                                               Sports drinks like Gatorade (NO RED)                      If you have questions, please contact your surgeon's office.   FOLLOW BOWEL PREP AND ANY ADDITIONAL PRE OP INSTRUCTIONS YOU RECEIVED FROM YOUR SURGEON'S OFFICE!!!     Oral  Hygiene is also important to reduce your risk of infection.                                    Remember - BRUSH YOUR TEETH THE MORNING OF SURGERY WITH YOUR REGULAR TOOTHPASTE  DENTURES WILL BE REMOVED PRIOR TO SURGERY PLEASE DO NOT APPLY "Poly grip" OR ADHESIVES!!!   Stop all vitamins and herbal supplements 7 days before surgery.   Take these medicines the morning of surgery with A SIP OF WATER: Metoprolol   DO NOT TAKE ANY ORAL DIABETIC MEDICATIONS DAY OF YOUR SURGERY  How to Manage Your Diabetes Before and After Surgery  Why is it important to control my blood sugar before and after surgery? Improving blood sugar levels before and after surgery helps healing and can limit problems. A way of improving blood sugar control is eating a healthy diet by:  Eating less sugar and carbohydrates  Increasing activity/exercise  Talking with your doctor about reaching your blood sugar goals High blood sugars (greater than 180 mg/dL) can raise your risk of infections and slow your recovery, so you will need to focus on controlling your diabetes during the weeks before surgery. Make sure that the doctor who takes care of your diabetes knows about your planned surgery including the date and location.  How do I manage my blood sugar before surgery? Check your blood sugar at least 4 times a day, starting 2 days before surgery, to make sure that the level is not too high or low. Check your blood sugar the morning of your surgery when you wake up and every 2 hours until you get to the Short Stay unit. If your blood sugar is less than 70 mg/dL, you will need to treat for low blood sugar: Do not take insulin . Treat a low blood sugar (less than 70 mg/dL) with  cup of clear juice (cranberry or apple), 4 glucose tablets, OR glucose gel. Recheck blood sugar in 15 minutes after treatment (to make sure it is greater than 70 mg/dL). If your blood sugar is not greater than 70 mg/dL on recheck, call [phone] for  further instructions. Report your blood sugar to the short stay nurse when you get to Short Stay.  If you are admitted to the hospital after surgery: Your blood sugar will be checked by the staff and you will probably be given insulin  after surgery (instead of oral diabetes medicines) to make sure you have good blood sugar levels. The goal for blood sugar control after surgery is 80-180 mg/dL.   WHAT DO I DO ABOUT MY DIABETES MEDICATION?  Do not take oral diabetes medicines (pills) the morning of surgery.  THE DAY BEFORE SURGERY, take insulin  as prescribed.   THE MORNING OF SURGERY, do not take insulin  unless blood sugar is greater than 220, then take 50% of dose.  If your CBG is greater than 220 mg/dL, you may take  of your sliding scale  (correction) dose of insulin .   Reviewed and Endorsed by [facility] Patient Education Committee, August 2015                              You may not have any metal on your body including hair pins, jewelry, and body piercing             Do not wear make-up, lotions, powders, perfumes, or deodorant  Do not wear nail polish including gel and S&S, artificial/acrylic nails, or any other type of covering on natural nails including finger and toenails. If you have artificial nails, gel coating, etc. that needs to be removed by a nail salon please have this removed prior to surgery or surgery may need to be canceled/ delayed if the surgeon/ anesthesia feels like they are unable to be safely monitored.   Do not shave  48 hours prior to surgery.    Do not bring valuables to the hospital. [facility] IS NOT  RESPONSIBLE   FOR VALUABLES.   Contacts, glasses, dentures or bridgework may not be worn into surgery.   Bring small overnight bag day of surgery.   DO NOT BRING YOUR HOME MEDICATIONS TO THE HOSPITAL. PHARMACY WILL DISPENSE MEDICATIONS LISTED ON YOUR MEDICATION LIST TO YOU DURING YOUR ADMISSION IN THE HOSPITAL!              Please  read over the following fact sheets you were given: IF YOU HAVE QUESTIONS ABOUT YOUR PRE-OP INSTRUCTIONS PLEASE CALL [name]    If you received a COVID test during your pre-op visit  it is requested that you wear a mask when out in public, stay away from anyone that may not be feeling well and notify your surgeon if you develop symptoms. If you test positive for Covid or have been in contact with anyone that has tested positive in the last 10 days please notify you surgeon.    [facility] - Preparing for Surgery Before surgery, you can play an important role.  Because skin is not sterile, your skin needs to be as free of germs as possible.  You can reduce the number of germs on your skin by washing with CHG (chlorahexidine gluconate) soap before surgery.  CHG is an antiseptic cleaner which kills germs and bonds with the skin to continue killing germs even after washing. Please DO NOT use if you have an allergy to CHG or antibacterial soaps.  If your skin becomes reddened/irritated stop using the CHG and inform your nurse when you arrive at Short Stay. Do not shave (including legs and underarms) for at least 48 hours prior to the first CHG shower.  You may shave your face/neck.  Please follow these instructions carefully:  1.  Shower with CHG Soap the night before surgery and the  morning of surgery.  2.  If you choose to wash your hair, wash your hair first as usual with your normal  shampoo.  3.  After you shampoo, rinse your hair and body thoroughly to remove the shampoo.                             4.  Use CHG as you would any other liquid soap.  You can apply chg directly to the skin and wash.  Gently with a scrungie or clean washcloth.  5.  Apply the CHG Soap to your body ONLY FROM THE NECK DOWN.   Do   not use on face/ open                           Wound or open sores. Avoid contact with eyes, ears mouth and   genitals (private parts).                       Wash face,  Genitals  (private parts) with your normal soap.             6.  Wash thoroughly, paying special attention to the area where your    surgery  will be performed.  7.  Thoroughly rinse your body with warm water from the neck down.  8.  DO NOT shower/wash with your normal soap after using and rinsing off the CHG Soap.                9.  Pat yourself dry with a clean towel.  10.  Wear clean pajamas.            11.  Place clean sheets on your bed the night of your first shower and do not  sleep with pets. Day of Surgery : Do not apply any lotions/deodorants the morning of surgery.  Please wear clean clothes to the hospital/surgery center.  FAILURE TO FOLLOW THESE INSTRUCTIONS MAY RESULT IN THE CANCELLATION OF YOUR SURGERY  PATIENT SIGNATURE_________________________________  NURSE SIGNATURE__________________________________  ________________________________________________________________________ WHAT IS A BLOOD TRANSFUSION? Blood Transfusion Information  A transfusion is the replacement of blood or some of its parts. Blood is made up of multiple cells which provide different functions. Red blood cells carry oxygen and are used for blood loss replacement. White blood cells fight against infection. Platelets control bleeding. Plasma helps clot blood. Other blood products are available for specialized needs, such as hemophilia or other clotting disorders. BEFORE THE TRANSFUSION  Who gives blood for transfusions?  Healthy volunteers who are fully evaluated to make sure their blood is safe. This is blood bank blood. Transfusion therapy is the safest it has ever been in the practice of medicine. Before blood is taken from a donor, a complete history is taken to make sure that person has no history of diseases nor engages in risky social behavior (examples are intravenous drug use or sexual activity with multiple partners). The donor's travel history is screened to minimize risk of transmitting  infections, such as malaria. The donated blood is tested for signs of infectious diseases, such as HIV and hepatitis. The blood is then tested to be sure it is compatible with you in order to minimize the chance of a transfusion reaction. If you or a relative donates blood, this is often done in anticipation of surgery and is not appropriate for emergency situations. It takes many days to process the donated blood. RISKS AND COMPLICATIONS Although transfusion therapy is very safe and saves many lives, the main dangers of transfusion include:  Getting an infectious disease. Developing a transfusion reaction. This is an allergic reaction to something in the blood you were given. Every precaution is taken to prevent this. The decision to have a blood transfusion has been considered carefully by your caregiver before blood is given. Blood is not given unless the benefits outweigh the risks. AFTER THE TRANSFUSION Right after receiving a blood transfusion, you will usually feel much better and more energetic. This is especially true if your red blood cells have gotten low (anemic). The transfusion raises the level of the red blood cells which carry oxygen, and this usually causes an energy increase. The nurse administering the transfusion will monitor you carefully for complications. HOME CARE INSTRUCTIONS  No special instructions are needed after a transfusion. You may find your energy is better. Speak with your caregiver about any limitations on activity for underlying diseases you may have. SEEK MEDICAL CARE IF:  Your condition is not improving after your transfusion. You develop redness or irritation at the intravenous (IV) site. SEEK IMMEDIATE MEDICAL CARE IF:  Any of the following symptoms occur over the next 12 hours: Shaking chills. You have a temperature by mouth above 102 F (38.9 C), not controlled by medicine. Chest, back, or muscle pain. People around you feel you are not acting correctly  or are confused. Shortness of breath or difficulty breathing. Dizziness and fainting. You get a rash or develop hives. You have a decrease in urine output. Your urine turns a dark color or changes to pink, red,  or brown. Any of the following symptoms occur over the next 10 days: You have a temperature by mouth above 102 F (38.9 C), not controlled by medicine. Shortness of breath. Weakness after normal activity. The white part of the eye turns yellow (jaundice). You have a decrease in the amount of urine or are urinating less often. Your urine turns a dark color or changes to pink, red, or brown. Document Released: 09/12/2000 Document Revised: 12/08/2011 Document Reviewed: 05/01/2008 [facility] Patient Information 2014 [place], [place].  _______________________________________________________________________

## 2024-02-04 ENCOUNTER — Encounter (HOSPITAL_COMMUNITY): Payer: Self-pay

## 2024-02-04 ENCOUNTER — Other Ambulatory Visit: Payer: Self-pay

## 2024-02-04 ENCOUNTER — Inpatient Hospital Stay: Attending: Psychiatry

## 2024-02-04 ENCOUNTER — Encounter (HOSPITAL_COMMUNITY)
Admission: RE | Admit: 2024-02-04 | Discharge: 2024-02-04 | Disposition: A | Discharge status: Home / Self Care | Source: Ambulatory Visit | Attending: Psychiatry | Admitting: Psychiatry

## 2024-02-04 ENCOUNTER — Telehealth: Payer: Self-pay | Admitting: *Deleted

## 2024-02-04 ENCOUNTER — Ambulatory Visit: Payer: Self-pay | Admitting: Cardiology

## 2024-02-04 VITALS — BP 153/108 | HR 76 | Temp 98.4°F | Resp 16 | Ht 67.0 in | Wt 211.0 lb

## 2024-02-04 DIAGNOSIS — I10 Essential (primary) hypertension: Secondary | ICD-10-CM | POA: Insufficient documentation

## 2024-02-04 DIAGNOSIS — Z794 Long term (current) use of insulin: Secondary | ICD-10-CM | POA: Diagnosis not present

## 2024-02-04 DIAGNOSIS — R1900 Intra-abdominal and pelvic swelling, mass and lump, unspecified site: Secondary | ICD-10-CM | POA: Diagnosis not present

## 2024-02-04 DIAGNOSIS — N838 Other noninflammatory disorders of ovary, fallopian tube and broad ligament: Secondary | ICD-10-CM | POA: Insufficient documentation

## 2024-02-04 DIAGNOSIS — Z01812 Encounter for preprocedural laboratory examination: Secondary | ICD-10-CM | POA: Diagnosis present

## 2024-02-04 DIAGNOSIS — Z87891 Personal history of nicotine dependence: Secondary | ICD-10-CM | POA: Insufficient documentation

## 2024-02-04 DIAGNOSIS — E089 Diabetes mellitus due to underlying condition without complications: Secondary | ICD-10-CM | POA: Insufficient documentation

## 2024-02-04 DIAGNOSIS — Z8673 Personal history of transient ischemic attack (TIA), and cerebral infarction without residual deficits: Secondary | ICD-10-CM | POA: Insufficient documentation

## 2024-02-04 LAB — COMPREHENSIVE METABOLIC PANEL WITH GFR
ALT: 29 U/L (ref 0–44)
AST: 23 U/L (ref 15–41)
Albumin: 3.8 g/dL (ref 3.5–5.0)
Alkaline Phosphatase: 78 U/L (ref 38–126)
Anion gap: 11 (ref 5–15)
BUN: 18 mg/dL (ref 6–20)
CO2: 22 mmol/L (ref 22–32)
Calcium: 9.3 mg/dL (ref 8.9–10.3)
Chloride: 106 mmol/L (ref 98–111)
Creatinine, Ser: 1.27 mg/dL — ABNORMAL HIGH (ref 0.44–1.00)
GFR, Estimated: 53 mL/min — ABNORMAL LOW (ref >60)
Glucose, Bld: 193 mg/dL — ABNORMAL HIGH (ref 70–99)
Potassium: 3.6 mmol/L (ref 3.5–5.1)
Sodium: 139 mmol/L (ref 135–145)
Total Bilirubin: 0.4 mg/dL (ref 0.0–1.2)
Total Protein: 7.6 g/dL (ref 6.5–8.1)

## 2024-02-04 LAB — HEMOGLOBIN A1C
Hgb A1c MFr Bld: 8.3 % — ABNORMAL HIGH (ref 4.8–5.6)
Mean Plasma Glucose: 191.51 mg/dL

## 2024-02-04 LAB — CBC
HCT: 35.4 % — ABNORMAL LOW (ref 36.0–46.0)
Hemoglobin: 10.5 g/dL — ABNORMAL LOW (ref 12.0–15.0)
MCH: 26.9 pg (ref 26.0–34.0)
MCHC: 29.7 g/dL — ABNORMAL LOW (ref 30.0–36.0)
MCV: 90.8 fL (ref 80.0–100.0)
Platelets: 221 10*3/uL (ref 150–400)
RBC: 3.9 MIL/uL (ref 3.87–5.11)
RDW: 13.6 % (ref 11.5–15.5)
WBC: 6 10*3/uL (ref 4.0–10.5)
nRBC: 0 % (ref 0.0–0.2)

## 2024-02-04 LAB — GLUCOSE, CAPILLARY: Glucose-Capillary: 169 mg/dL — ABNORMAL HIGH (ref 70–99)

## 2024-02-04 NOTE — Telephone Encounter (Signed)
 Dr. Albert Huff,   You saw this patient on 01/20/2024.  She underwent coronary CTA and echo for further risk stratification prior to surgery.  Per office protocol, will you please comment on medical clearance for total hysterectomy scheduled for 02/09/2024?  Please route your response to P [facility] Preop. I will communicate with requesting office once you have given recommendations.   Thank you!  Morey Ar, NP

## 2024-02-04 NOTE — Patient Instructions (Signed)
 Preparing for your Surgery  Plan for surgery on Feb 09, 2024 with Dr. Derrel Flies at [facility]. You will be scheduled for robotic assisted laparoscopic unilateral salpingo-oophorectomy (removal of one ovary and fallopian tube), mini laparotomy for controlled cyst drainage, possible bilateral salpingo-oophorectomy (possible removal of both tubes and ovaries), possible total hysterectomy (removal of uterus and cervix), possible staging if a precancer or cancer is found.   Pre-operative Testing -You will receive a phone call from presurgical testing at [facility] to arrange for a pre-operative appointment and lab work.  -Bring your insurance card, copy of an advanced directive if applicable, medication list  -At that visit, you will be asked to sign a consent for a possible blood transfusion in case a transfusion becomes necessary during surgery.  The need for a blood transfusion is rare but having consent is a necessary part of your care.     -You should not be taking blood thinners or aspirin  at least ten days prior to surgery unless instructed by your surgeon.  -Do not take supplements such as fish oil (omega 3), red yeast rice, turmeric before your surgery. STOP TAKING AT LEAST 10 DAYS BEFORE SURGERY. You want to avoid medications with aspirin  in them including headache powders such as BC or Goody's), Excedrin migraine.  Day Before Surgery at Home -You will be asked to take in a light diet the day before surgery. You will be advised you can have clear liquids up until 3 hours before your surgery.    Eat a light diet the day before surgery.  Examples including soups, broths, toast, yogurt, mashed potatoes.  AVOID GAS PRODUCING FOODS AND BEVERAGES. Things to avoid include carbonated beverages (fizzy beverages, sodas), raw fruits and raw vegetables (uncooked), or beans.   If your bowels are filled with gas, your surgeon will have difficulty visualizing your pelvic organs  which increases your surgical risks.  Your role in recovery Your role is to become active as soon as directed by your doctor, while still giving yourself time to heal.  Rest when you feel tired. You will be asked to do the following in order to speed your recovery:  - Cough and breathe deeply. This helps to clear and expand your lungs and can prevent pneumonia after surgery.  - STAY ACTIVE WHEN YOU GET HOME. Do mild physical activity. Walking or moving your legs help your circulation and body functions return to normal. Do not try to get up or walk alone the first time after surgery.   -If you develop swelling on one leg or the other, pain in the back of your leg, redness/warmth in one of your legs, please call the office or go to the Emergency Room to have a doppler to rule out a blood clot. For shortness of breath, chest pain-seek care in the Emergency Room as soon as possible. - Actively manage your pain. Managing your pain lets you move in comfort. We will ask you to rate your pain on a scale of zero to 10. It is your responsibility to tell your doctor or nurse where and how much you hurt so your pain can be treated.  Special Considerations -If you are diabetic, you may be placed on insulin  after surgery to have closer control over your blood sugars to promote healing and recovery.  This does not mean that you will be discharged on insulin .  If applicable, your oral antidiabetics will be resumed when you are tolerating a solid diet.  -  Your final pathology results from surgery should be available around one week after surgery and the results will be relayed to you when available.  -Dr. Abdul Hodgkin is the surgeon that assists your GYN Oncologist with surgery.  If you end up staying the night, the next day after your surgery you will either see Dr. Orvil Bland, Dr. Daisey Dryer, or Dr. Abdul Hodgkin.  -FMLA forms can be faxed to [phone] and please allow 5-7 business days for  completion.  Pain Management After Surgery -You will be prescribed your pain medication and bowel regimen medications before surgery so that you can have these available when you are discharged from the hospital. The pain medication is for use ONLY AFTER surgery and a new prescription will not be given.   -Make sure that you have Tylenol  IF YOU ARE ABLE TO TAKE THESE MEDICATION at home to use on a regular basis after surgery for pain control.  -Review the attached handout on narcotic use and their risks and side effects.   Bowel Regimen -You will be prescribed Sennakot-S to take nightly to prevent constipation especially if you are taking the narcotic pain medication intermittently.  It is important to prevent constipation and drink adequate amounts of liquids. You can stop taking this medication when you are not taking pain medication and you are back on your normal bowel routine.  Risks of Surgery Risks of surgery are low but include bleeding, infection, damage to surrounding structures, re-operation, blood clots, and very rarely death.   Blood Transfusion Information (For the consent to be signed before surgery)  We will be checking your blood type before surgery so in case of emergencies, we will know what type of blood you would need.                                            WHAT IS A BLOOD TRANSFUSION?  A transfusion is the replacement of blood or some of its parts. Blood is made up of multiple cells which provide different functions. Red blood cells carry oxygen and are used for blood loss replacement. White blood cells fight against infection. Platelets control bleeding. Plasma helps clot blood. Other blood products are available for specialized needs, such as hemophilia or other clotting disorders. BEFORE THE TRANSFUSION  Who gives blood for transfusions?  You may be able to donate blood to be used at a later date on yourself (autologous donation). Relatives can be asked to  donate blood. This is generally not any safer than if you have received blood from a stranger. The same precautions are taken to ensure safety when a relative's blood is donated. Healthy volunteers who are fully evaluated to make sure their blood is safe. This is blood bank blood. Transfusion therapy is the safest it has ever been in the practice of medicine. Before blood is taken from a donor, a complete history is taken to make sure that person has no history of diseases nor engages in risky social behavior (examples are intravenous drug use or sexual activity with multiple partners). The donor's travel history is screened to minimize risk of transmitting infections, such as malaria. The donated blood is tested for signs of infectious diseases, such as HIV and hepatitis. The blood is then tested to be sure it is compatible with you in order to minimize the chance of a transfusion reaction. If you or a  relative donates blood, this is often done in anticipation of surgery and is not appropriate for emergency situations. It takes many days to process the donated blood. RISKS AND COMPLICATIONS Although transfusion therapy is very safe and saves many lives, the main dangers of transfusion include:  Getting an infectious disease. Developing a transfusion reaction. This is an allergic reaction to something in the blood you were given. Every precaution is taken to prevent this. The decision to have a blood transfusion has been considered carefully by your caregiver before blood is given. Blood is not given unless the benefits outweigh the risks.  AFTER SURGERY INSTRUCTIONS  Return to work: 4-6 weeks if applicable  Activity: 1. Be up and out of the bed during the day.  Take a nap if needed.  You may walk up steps but be careful and use the hand rail.  Stair climbing will tire you more than you think, you may need to stop part way and rest.   2. No lifting or straining for 6 weeks over 10 pounds. No pushing,  pulling, straining for 6 weeks.  3. No driving for 2-44 days when the following criteria have been met: Do not drive if you are taking narcotic pain medicine and make sure that your reaction time has returned.   4. You can shower as soon as the next day after surgery. Shower daily.  Use your regular soap and water (not directly on the incision) and pat your incision(s) dry afterwards; don't rub.  No tub baths or submerging your body in water until cleared by your surgeon. If you have the soap that was given to you by pre-surgical testing that was used before surgery, you do not need to use it afterwards because this can irritate your incisions.   5. No sexual activity and nothing in the vagina for 6 weeks. 12 weeks if you have a hysterectomy (removal of the uterus and cervix)  6. You may experience a small amount of clear drainage from your incisions, which is normal.  If the drainage persists, increases, or changes color please call the office.  7. Do not use creams, lotions, or ointments such as neosporin on your incisions after surgery until advised by your surgeon because they can cause removal of the dermabond glue on your incisions.    8. You may experience vaginal spotting after surgery or when the stitches at the top of the vagina begin to dissolve (if you have a hysterectomy).  The spotting is normal but if you experience heavy bleeding, call our office.  9. Take Tylenol  first for pain if you are able to take these medication and only use narcotic pain medication for severe pain not relieved by the Tylenol .  Monitor your Tylenol  intake to a max of 4,000 mg in a 24 hour period.   Diet: 1. Low sodium Heart Healthy Diet is recommended but you are cleared to resume your normal (before surgery) diet after your procedure.  2. It is safe to use a laxative, such as Miralax or Colace, if you have difficulty moving your bowels before surgery. You have been prescribed Sennakot-S to take at bedtime  every evening after surgery to keep bowel movements regular and to prevent constipation.    Wound Care: 1. Keep clean and dry.  Shower daily.  Reasons to call the Doctor: Fever - Oral temperature greater than 100.4 degrees Fahrenheit Foul-smelling vaginal discharge Difficulty urinating Nausea and vomiting Increased pain at the site of the incision that is  unrelieved with pain medicine. Difficulty breathing with or without chest pain New calf pain especially if only on one side Sudden, continuing increased vaginal bleeding with or without clots.   Contacts: For questions or concerns you should contact:  Dr. Derrel Flies at [phone]  Vira Grieves, NP at [phone]  After Hours: call [phone] and have the GYN Oncologist paged/contacted (after 5 pm or on the weekends). You will speak with an after hours RN and let he or she know you have had surgery.  Messages sent via mychart are for non-urgent matters and are not responded to after hours so for urgent needs, please call the after hours number.

## 2024-02-04 NOTE — Telephone Encounter (Signed)
 Overall acceptable risk for upcoming noncardiac surgery; however, moderate cardiovascular risk due to her comorbid conditions which include prior stroke, diabetes.  This is nonmodifiable.  The surgery is not prohibitive. No active chest pain or heart failure symptoms Recent coronary CTA noted no hemodynamically significant stenosis Plavix  initiated by neurology and will defer management to them.  Ebony Matthews [place], DO, [facility]

## 2024-02-04 NOTE — Telephone Encounter (Signed)
   Name: Ebony Matthews  DOB: 1981/07/15  MRN: [id number]   Primary Cardiologist: Olinda Bertrand, DO  Chart reviewed as part of pre-operative protocol coverage. [name] was last seen on 01/20/2024 by Dr. Albert Huff.  Per Dr. Albert Huff, "Overall acceptable risk for upcoming noncardiac surgery; however, moderate cardiovascular risk due to her comorbid conditions which include prior stroke, diabetes.  This is nonmodifiable.  The surgery is not prohibitive.  No active chest pain or heart failure symptoms  Recent coronary CTA noted no hemodynamically significant stenosis  Plavix  initiated by neurology and will defer management to them."  Therefore, based on ACC/AHA guidelines, the patient would be an acceptable risk for the planned procedure without further cardiovascular testing.   I will route this recommendation to the requesting party via Epic fax function and remove from pre-op pool. Please call with questions.  Morey Ar, NP 02/04/2024, 12:33 PM

## 2024-02-04 NOTE — Telephone Encounter (Signed)
   Pre-operative Risk Assessment    Patient Name: Ebony Matthews  DOB: Oct 11, 1980 MRN: [id number]   Date of last office visit: 01/20/2024 Date of next office visit: N/A   Request for Surgical Clearance    Procedure:  ROBOTIC ASSISTED LAPAROSCOPIC UNILATERAL SALPINGO-OOPHORECTOMY, POSSIBLE TOTAL HYSTERECTOMY, POSSIBLE STAGING, MINI LAPARATOMY, POSSIBLE BILATER4AL SALPINGO-OOPHORECTOMY   Date of Surgery:  Clearance 02/09/24                                Surgeon:  Derrel Flies, MD Surgeon's Group or Practice Name:  [facility] Phone number:  [phone] Fax number:  [phone]   Type of Clearance Requested:   - Medical    Type of Anesthesia:  General    Additional requests/questions:  THEY ARE ASKING IF PT CAN HAVE SURGERY AT [facility] LONG AND DOES PT NEED TO BE OVERNIGHT   Signed, Maeola Schmidt   02/04/2024, 9:24 AM

## 2024-02-04 NOTE — Telephone Encounter (Signed)
 I was still waiting on FFR Results, can you follow up?  Ebony Matthews [place], DO, [facility]

## 2024-02-04 NOTE — Progress Notes (Unsigned)
 Patient here for a pre-operative appointment prior to her scheduled surgery on 02/09/24. She is scheduled for a robotic assisted laparoscopic unilateral salpingo-oophorectomy, mini laparotomy for controlled cyst drainage, possible bilateral salpingo-oophorectomy, possible total hysterectomy, possible staging if a precancer or cancer is found. She had her pre-admission testing appointment this am at [facility].  The surgery was discussed in detail.  See after visit summary for additional details.    Discussed post-op pain management in detail including the aspects of the enhanced recovery pathway.  Advised her that a new prescription would be sent in for Tramadol and it is only to be used for after her upcoming surgery.  We discussed the use of tylenol  post-op and to monitor for a maximum of 4,000 mg in a 24 hour period.  Also prescribed sennakot to be used after surgery and to hold if having loose stools.  Discussed bowel regimen in detail.     Discussed the use of SCDs and measures to take at home to prevent DVT including frequent mobility.  Reportable signs and symptoms of DVT discussed. Post-operative instructions discussed and expectations for after surgery. Incisional care discussed as well including reportable signs and symptoms including erythema, drainage, wound separation.     30 minutes spent with the patient.  Verbalizing understanding of material discussed. No needs or concerns voiced at the end of the visit.   Advised patient to call for any needs.  Advised that her post-operative medications had been prescribed and could be picked up at any time.    This appointment is included in the global surgical bundle as pre-operative teaching and has no charge.

## 2024-02-05 NOTE — Anesthesia Preprocedure Evaluation (Signed)
 Anesthesia Evaluation  Patient identified by MRN, date of birth, ID band Patient awake    Reviewed: Allergy & Precautions, H&P , NPO status , Patient's Chart, lab work & pertinent test results  Airway Mallampati: II   Neck ROM: full    Dental   Pulmonary former smoker   breath sounds clear to auscultation       Cardiovascular hypertension,  Rhythm:regular Rate:Normal     Neuro/Psych Left side weakness CVA, Residual Symptoms    GI/Hepatic   Endo/Other  diabetes, Type 2    Renal/GU      Musculoskeletal   Abdominal   Peds  Hematology   Anesthesia Other Findings   Reproductive/Obstetrics                             Anesthesia Physical Anesthesia Plan  ASA: 3  Anesthesia Plan: General   Post-op Pain Management:    Induction: Intravenous  PONV Risk Score and Plan: 3 and Ondansetron , Dexamethasone, Midazolam and Treatment may vary due to age or medical condition  Airway Management Planned: Oral ETT  Additional Equipment:   Intra-op Plan:   Post-operative Plan: Extubation in OR  Informed Consent: I have reviewed the patients History and Physical, chart, labs and discussed the procedure including the risks, benefits and alternatives for the proposed anesthesia with the patient or authorized representative who has indicated his/her understanding and acceptance.     Dental advisory given  Plan Discussed with: CRNA, Anesthesiologist and Surgeon  Anesthesia Plan Comments: (See PAT note 02/04/2024)       Anesthesia Quick Evaluation

## 2024-02-05 NOTE — Progress Notes (Signed)
 Anesthesia Chart Review   Case: [id number] Date/Time: 02/09/24 0830   Procedure: SALPINGO-OOPHORECTOMY, BILATERAL, ROBOT-ASSISTED - UNILATERAL SALPINGO OOPHORECTOMY WITH POSSIBLE BILATERAL OOPHORECTOMY, MINI LAPAROTOMY FOR CONTROLLED DRAINAGE, POSSIBLE STAGING   Anesthesia type: General   Diagnosis: Ovarian mass [N83.8]   Pre-op diagnosis: OVARIAN MASS   Location: [facility] ROOM 05 / [facility]   Surgeons: Derrel Flies, MD       DISCUSSION:43 y.o. former smoker with h/o HTN, multiple strokes in 2022 with loop recorder in place, DM II, ovarian mass scheduled for above procedure 02/09/2024 with Dr. Derrel Flies.   Per cardiology preoperative evaluation 02/04/2024, "Chart reviewed as part of pre-operative protocol coverage. [name] Luanne Jernberg was last seen on 01/20/2024 by Dr. Albert Huff.  Per Dr. Albert Huff, "Overall acceptable risk for upcoming noncardiac surgery; however, moderate cardiovascular risk due to her comorbid conditions which include prior stroke, diabetes.  This is nonmodifiable.  The surgery is not prohibitive.  No active chest pain or heart failure symptoms  Recent coronary CTA noted no hemodynamically significant stenosis  Plavix  initiated by neurology and will defer management to them."   Therefore, based on ACC/AHA guidelines, the patient would be an acceptable risk for the planned procedure without further cardiovascular testing. "   VS: BP (!) 153/108   Pulse 76   Temp 36.9 C (Oral)   Resp 16   Ht 5\' 7"  (1.702 m)   Wt 95.7 kg   LMP 01/29/2024   SpO2 100%   BMI 33.05 kg/m   PROVIDERS: Ulysees Gander, MD is PCP   Primary Cardiologist: Olinda Bertrand, DO  LABS: Labs reviewed: Acceptable for surgery. (all labs ordered are listed, but only abnormal results are displayed)  Labs Reviewed  CBC - Abnormal; Notable for the following components:      Result Value   Hemoglobin 10.5 (*)    HCT 35.4 (*)    MCHC 29.7 (*)    All other components within normal limits   COMPREHENSIVE METABOLIC PANEL WITH GFR - Abnormal; Notable for the following components:   Glucose, Bld 193 (*)    Creatinine, Ser 1.27 (*)    GFR, Estimated 53 (*)    All other components within normal limits  HEMOGLOBIN A1C - Abnormal; Notable for the following components:   Hgb A1c MFr Bld 8.3 (*)    All other components within normal limits  GLUCOSE, CAPILLARY - Abnormal; Notable for the following components:   Glucose-Capillary 169 (*)    All other components within normal limits  TYPE AND SCREEN     IMAGES:   EKG:   CV: Echo 01/21/2024 1. Left ventricular ejection fraction, by estimation, is 60 to 65%. Left  ventricular ejection fraction by 3D volume is 55 %. The left ventricle has  normal function. The left ventricle has no regional wall motion  abnormalities. Left ventricular diastolic   parameters are consistent with Grade I diastolic dysfunction (impaired  relaxation). The average left ventricular global longitudinal strain is  -21.4 %. The global longitudinal strain is normal.   2. Right ventricular systolic function is normal. The right ventricular  size is normal. There is normal pulmonary artery systolic pressure. The  estimated right ventricular systolic pressure is 22.9 mmHg.   3. The mitral valve is normal in structure. Trivial mitral valve  regurgitation. No evidence of mitral stenosis.   4. The aortic valve is tricuspid. Aortic valve regurgitation is not  visualized. No aortic stenosis is present.   5. The inferior vena cava is  normal in size with greater than 50%  respiratory variability, suggesting right atrial pressure of 3 mmHg.  Past Medical History:  Diagnosis Date   Diabetes mellitus without complication (HCC)    Hyperlipidemia    Hypertension    Loop recorder [employer] 12/14/2020 12/14/2020   Scheduled Remote loop recorder check 12/14/2020: NSR. New implant.    Stroke [facility])    left sided weakness   TIA (transient ischemic attack) 01/22/2019     Past Surgical History:  Procedure Laterality Date   LOOP RECORDER IMPLANT      MEDICATIONS:  BD PEN NEEDLE MICRO U/F 32G X 6 MM MISC   folic acid  (FOLVITE ) 1 MG tablet   insulin  lispro (HUMALOG) 100 UNIT/ML KwikPen   losartan  (COZAAR ) 25 MG tablet   metoprolol  tartrate (LOPRESSOR ) 25 MG tablet   nitroGLYCERIN  (NITROSTAT ) 0.4 MG SL tablet   rosuvastatin (CRESTOR) 10 MG tablet   No current facility-administered medications for this encounter.     Chick [place], PA-C [facility]Surgical [facility] [phone]

## 2024-02-08 ENCOUNTER — Other Ambulatory Visit (HOSPITAL_COMMUNITY)

## 2024-02-08 ENCOUNTER — Telehealth: Payer: Self-pay | Admitting: *Deleted

## 2024-02-08 ENCOUNTER — Ambulatory Visit (INDEPENDENT_AMBULATORY_CARE_PROVIDER_SITE_OTHER): Payer: 59

## 2024-02-08 DIAGNOSIS — G459 Transient cerebral ischemic attack, unspecified: Secondary | ICD-10-CM

## 2024-02-08 LAB — CUP PACEART REMOTE DEVICE CHECK
Date Time Interrogation Session: 20250512081442
Implantable Pulse Generator Implant Date: 20220318
Pulse Gen Model: 436066
Pulse Gen Serial Number: 94060471

## 2024-02-08 NOTE — Discharge Instructions (Signed)
 AFTER SURGERY INSTRUCTIONS   Return to work: 4-6 weeks if applicable   Activity: 1. Be up and out of the bed during the day.  Take a nap if needed.  You may walk up steps but be careful and use the hand rail.  Stair climbing will tire you more than you think, you may need to stop part way and rest.    2. No lifting or straining for 6 weeks over 10 pounds. No pushing, pulling, straining for 6 weeks.   3. No driving for 1-61 days when the following criteria have been met: Do not drive if you are taking narcotic pain medicine and make sure that your reaction time has returned.    4. You can shower as soon as the next day after surgery. Shower daily.  Use your regular soap and water (not directly on the incision) and pat your incision(s) dry afterwards; don't rub.  No tub baths or submerging your body in water until cleared by your surgeon. If you have the soap that was given to you by pre-surgical testing that was used before surgery, you do not need to use it afterwards because this can irritate your incisions.    5. No sexual activity and nothing in the vagina for 6 weeks. 12 weeks if you have a hysterectomy (removal of the uterus and cervix)   6. You may experience a small amount of clear drainage from your incisions, which is normal.  If the drainage persists, increases, or changes color please call the office.   7. Do not use creams, lotions, or ointments such as neosporin on your incisions after surgery until advised by your surgeon because they can cause removal of the dermabond glue on your incisions.     8. You may experience vaginal spotting after surgery or when the stitches at the top of the vagina begin to dissolve (if you have a hysterectomy).  The spotting is normal but if you experience heavy bleeding, call our office.   9. Take Tylenol  first for pain if you are able to take these medication and only use narcotic pain medication for severe pain not relieved by the Tylenol .  Monitor  your Tylenol  intake to a max of 4,000 mg in a 24 hour period.    Diet: 1. Low sodium Heart Healthy Diet is recommended but you are cleared to resume your normal (before surgery) diet after your procedure.   2. It is safe to use a laxative, such as Miralax or Colace, if you have difficulty moving your bowels before surgery. You have been prescribed Sennakot-S to take at bedtime every evening after surgery to keep bowel movements regular and to prevent constipation.     Wound Care: 1. Keep clean and dry.  Shower daily.   Reasons to call the Doctor: Fever - Oral temperature greater than 100.4 degrees Fahrenheit Foul-smelling vaginal discharge Difficulty urinating Nausea and vomiting Increased pain at the site of the incision that is unrelieved with pain medicine. Difficulty breathing with or without chest pain New calf pain especially if only on one side Sudden, continuing increased vaginal bleeding with or without clots.   Contacts: For questions or concerns you should contact:   Dr. Derrel Flies at [phone]   Vira Grieves, NP at [phone]   After Hours: call [phone] and have the GYN Oncologist paged/contacted (after 5 pm or on the weekends). You will speak with an after hours RN and let he or she know you have had surgery.  Messages sent via mychart are for non-urgent matters and are not responded to after hours so for urgent needs, please call the after hours number.

## 2024-02-08 NOTE — Telephone Encounter (Signed)
-----   Message from Suellyn Emory sent at 02/08/2024  9:56 AM EDT ----- [facility] you talk with her for preop, please tell her her A1C was up to 8.3. Very important she limits sugary items and simple carbs and monitors her blood sugars to prevent complications after surgery. If blood sugar is too high day of surgery, can run the risk of having surgery postponed.

## 2024-02-08 NOTE — Telephone Encounter (Signed)
 Telephone call to check on pre-operative status.  Patient compliant with pre-operative instructions.  Reinforced nothing to eat after midnight. Clear liquids until 0530. Patient to arrive at 0630.  No questions or concerns voiced. Relayed message from Vira Grieves, NP that pt's A1C was up to 8.3 (pre-op labs) Very important to limit sugary items and simple carbs and monitor blood sugars to prevent complications after surgery. If blood sugar is too high day of surgery, can run the risk of having surgery postponed. Pt verbalized understanding and Instructed to call for any needs.

## 2024-02-09 ENCOUNTER — Ambulatory Visit (HOSPITAL_COMMUNITY)
Admission: RE | Admit: 2024-02-09 | Discharge: 2024-02-10 | Disposition: A | Discharge status: Home / Self Care | Attending: Psychiatry | Admitting: Psychiatry

## 2024-02-09 ENCOUNTER — Ambulatory Visit (HOSPITAL_BASED_OUTPATIENT_CLINIC_OR_DEPARTMENT_OTHER): Admitting: Anesthesiology

## 2024-02-09 ENCOUNTER — Other Ambulatory Visit: Payer: Self-pay

## 2024-02-09 ENCOUNTER — Encounter (HOSPITAL_COMMUNITY)
Admission: RE | Disposition: A | Discharge status: Home / Self Care | Payer: Self-pay | Source: Home / Self Care | Attending: Psychiatry

## 2024-02-09 ENCOUNTER — Ambulatory Visit (HOSPITAL_COMMUNITY): Admitting: Physician Assistant

## 2024-02-09 ENCOUNTER — Encounter (HOSPITAL_COMMUNITY): Payer: Self-pay | Admitting: Psychiatry

## 2024-02-09 DIAGNOSIS — I10 Essential (primary) hypertension: Secondary | ICD-10-CM | POA: Diagnosis not present

## 2024-02-09 DIAGNOSIS — N838 Other noninflammatory disorders of ovary, fallopian tube and broad ligament: Secondary | ICD-10-CM | POA: Diagnosis present

## 2024-02-09 DIAGNOSIS — E119 Type 2 diabetes mellitus without complications: Secondary | ICD-10-CM | POA: Insufficient documentation

## 2024-02-09 DIAGNOSIS — N888 Other specified noninflammatory disorders of cervix uteri: Secondary | ICD-10-CM | POA: Diagnosis not present

## 2024-02-09 DIAGNOSIS — D3911 Neoplasm of uncertain behavior of right ovary: Secondary | ICD-10-CM | POA: Insufficient documentation

## 2024-02-09 DIAGNOSIS — C561 Malignant neoplasm of right ovary: Secondary | ICD-10-CM

## 2024-02-09 DIAGNOSIS — R1900 Intra-abdominal and pelvic swelling, mass and lump, unspecified site: Secondary | ICD-10-CM | POA: Diagnosis present

## 2024-02-09 DIAGNOSIS — Z87891 Personal history of nicotine dependence: Secondary | ICD-10-CM | POA: Diagnosis not present

## 2024-02-09 DIAGNOSIS — E089 Diabetes mellitus due to underlying condition without complications: Secondary | ICD-10-CM

## 2024-02-09 HISTORY — PX: ROBOTIC ASSISTED BILATERAL SALPINGO OOPHERECTOMY: SHX6078

## 2024-02-09 LAB — GLUCOSE, CAPILLARY
Glucose-Capillary: 163 mg/dL — ABNORMAL HIGH (ref 70–99)
Glucose-Capillary: 177 mg/dL — ABNORMAL HIGH (ref 70–99)
Glucose-Capillary: 226 mg/dL — ABNORMAL HIGH (ref 70–99)
Glucose-Capillary: 302 mg/dL — ABNORMAL HIGH (ref 70–99)

## 2024-02-09 LAB — TYPE AND SCREEN
ABO/RH(D): O POS
Antibody Screen: NEGATIVE

## 2024-02-09 LAB — HCG, SERUM, QUALITATIVE: Preg, Serum: NEGATIVE

## 2024-02-09 SURGERY — SALPINGO-OOPHORECTOMY, BILATERAL, ROBOT-ASSISTED
Anesthesia: General

## 2024-02-09 MED ORDER — PHENYLEPHRINE HCL (PRESSORS) 10 MG/ML IV SOLN
INTRAVENOUS | Status: AC
  Filled 2024-02-09: qty 1

## 2024-02-09 MED ORDER — BUPIVACAINE HCL (PF) 0.25 % IJ SOLN
INTRAMUSCULAR | Status: AC
  Filled 2024-02-09: qty 30

## 2024-02-09 MED ORDER — BUPIVACAINE LIPOSOME 1.3 % IJ SUSP
INTRAMUSCULAR | Status: AC
  Filled 2024-02-09: qty 20

## 2024-02-09 MED ORDER — FENTANYL CITRATE (PF) 100 MCG/2ML IJ SOLN
INTRAMUSCULAR | Status: AC
  Filled 2024-02-09: qty 2

## 2024-02-09 MED ORDER — SENNOSIDES-DOCUSATE SODIUM 8.6-50 MG PO TABS: 2.0000 | ORAL_TABLET | Freq: Every day | ORAL | 0 refills | Status: DC

## 2024-02-09 MED ORDER — CHLORHEXIDINE GLUCONATE 0.12 % MT SOLN
15.0000 mL | Freq: Once | OROMUCOSAL | Status: AC
  Administered 2024-02-09: 15 mL via OROMUCOSAL

## 2024-02-09 MED ORDER — OXYCODONE HCL 5 MG/5ML PO SOLN: 5.0000 mg | Freq: Once | ORAL | Status: DC | PRN

## 2024-02-09 MED ORDER — ROCURONIUM BROMIDE 100 MG/10ML IV SOLN
INTRAVENOUS | Status: DC | PRN
  Administered 2024-02-09: 20 mg via INTRAVENOUS
  Administered 2024-02-09: 50 mg via INTRAVENOUS

## 2024-02-09 MED ORDER — IBUPROFEN 200 MG PO TABS
600.0000 mg | ORAL_TABLET | Freq: Four times a day (QID) | ORAL | Status: DC
  Administered 2024-02-10: 600 mg via ORAL
  Filled 2024-02-09: qty 3

## 2024-02-09 MED ORDER — PROPOFOL 10 MG/ML IV BOLUS
INTRAVENOUS | Status: AC
  Filled 2024-02-09: qty 20

## 2024-02-09 MED ORDER — EPHEDRINE SULFATE-NACL 50-0.9 MG/10ML-% IV SOSY
PREFILLED_SYRINGE | INTRAVENOUS | Status: DC | PRN
  Administered 2024-02-09: 10 mg via INTRAVENOUS

## 2024-02-09 MED ORDER — METRONIDAZOLE 500 MG/100ML IV SOLN
500.0000 mg | INTRAVENOUS | Status: AC
  Administered 2024-02-09: 500 mg via INTRAVENOUS
  Filled 2024-02-09: qty 100

## 2024-02-09 MED ORDER — BUPIVACAINE HCL 0.25 % IJ SOLN
INTRAMUSCULAR | Status: DC | PRN
  Administered 2024-02-09: 30 mL

## 2024-02-09 MED ORDER — ONDANSETRON HCL 4 MG/2ML IJ SOLN: 4.0000 mg | Freq: Four times a day (QID) | INTRAMUSCULAR | Status: DC | PRN

## 2024-02-09 MED ORDER — PROPOFOL 10 MG/ML IV BOLUS
INTRAVENOUS | Status: DC | PRN
  Administered 2024-02-09: 200 mg via INTRAVENOUS
  Administered 2024-02-09: 50 ug/kg/min via INTRAVENOUS
  Administered 2024-02-09: 20 mg via INTRAVENOUS

## 2024-02-09 MED ORDER — OXYCODONE HCL 5 MG PO TABS: 5.0000 mg | ORAL_TABLET | Freq: Once | ORAL | Status: DC | PRN

## 2024-02-09 MED ORDER — MIDAZOLAM HCL 5 MG/5ML IJ SOLN
INTRAMUSCULAR | Status: DC | PRN
  Administered 2024-02-09: 1 mg via INTRAVENOUS

## 2024-02-09 MED ORDER — TRAMADOL HCL 50 MG PO TABS: 100.0000 mg | ORAL_TABLET | Freq: Four times a day (QID) | ORAL | Status: DC | PRN

## 2024-02-09 MED ORDER — HYDROMORPHONE HCL 2 MG/ML IJ SOLN
INTRAMUSCULAR | Status: AC
  Filled 2024-02-09: qty 1

## 2024-02-09 MED ORDER — HYDROMORPHONE HCL 1 MG/ML IJ SOLN
INTRAMUSCULAR | Status: DC | PRN
  Administered 2024-02-09 (×2): .4 mg via INTRAVENOUS

## 2024-02-09 MED ORDER — ACETAMINOPHEN 500 MG PO TABS
1000.0000 mg | ORAL_TABLET | ORAL | Status: AC
  Administered 2024-02-09: 1000 mg via ORAL
  Filled 2024-02-09: qty 2

## 2024-02-09 MED ORDER — PHENYLEPHRINE 80 MCG/ML (10ML) SYRINGE FOR IV PUSH (FOR BLOOD PRESSURE SUPPORT)
PREFILLED_SYRINGE | INTRAVENOUS | Status: DC | PRN
  Administered 2024-02-09: 160 ug via INTRAVENOUS
  Administered 2024-02-09: 120 ug via INTRAVENOUS
  Administered 2024-02-09: 160 ug via INTRAVENOUS

## 2024-02-09 MED ORDER — FENTANYL CITRATE (PF) 100 MCG/2ML IJ SOLN
INTRAMUSCULAR | Status: DC | PRN
  Administered 2024-02-09: 100 ug via INTRAVENOUS

## 2024-02-09 MED ORDER — 0.9 % SODIUM CHLORIDE (POUR BTL) OPTIME
TOPICAL | Status: DC | PRN
  Administered 2024-02-09: 1000 mL

## 2024-02-09 MED ORDER — INSULIN ASPART 100 UNIT/ML IJ SOLN
0.0000 [IU] | Freq: Every day | INTRAMUSCULAR | Status: DC
  Administered 2024-02-09: 4 [IU] via SUBCUTANEOUS

## 2024-02-09 MED ORDER — MIDAZOLAM HCL 2 MG/2ML IJ SOLN
INTRAMUSCULAR | Status: AC
  Filled 2024-02-09: qty 2

## 2024-02-09 MED ORDER — TRAMADOL HCL 50 MG PO TABS: 50.0000 mg | ORAL_TABLET | Freq: Two times a day (BID) | ORAL | 0 refills | Status: DC | PRN

## 2024-02-09 MED ORDER — ONDANSETRON HCL 4 MG PO TABS: 4.0000 mg | ORAL_TABLET | Freq: Four times a day (QID) | ORAL | Status: DC | PRN

## 2024-02-09 MED ORDER — SUGAMMADEX SODIUM 200 MG/2ML IV SOLN
INTRAVENOUS | Status: DC | PRN
Start: 2024-02-09 — End: 2024-02-09
  Administered 2024-02-09: 200 mg via INTRAVENOUS

## 2024-02-09 MED ORDER — SODIUM CHLORIDE 0.9% FLUSH
3.0000 mL | Freq: Two times a day (BID) | INTRAVENOUS | Status: DC
  Administered 2024-02-09: 3 mL via INTRAVENOUS
  Administered 2024-02-09: 5 mL via INTRAVENOUS
  Administered 2024-02-10: 3 mL via INTRAVENOUS

## 2024-02-09 MED ORDER — INSULIN ASPART 100 UNIT/ML IJ SOLN
0.0000 [IU] | Freq: Three times a day (TID) | INTRAMUSCULAR | Status: DC
Start: 2024-02-09 — End: 2024-02-10
  Administered 2024-02-09 – 2024-02-10 (×2): 5 [IU] via SUBCUTANEOUS

## 2024-02-09 MED ORDER — FENTANYL CITRATE PF 50 MCG/ML IJ SOSY: 25.0000 ug | PREFILLED_SYRINGE | INTRAMUSCULAR | Status: DC | PRN

## 2024-02-09 MED ORDER — ENOXAPARIN SODIUM 40 MG/0.4ML IJ SOSY
40.0000 mg | PREFILLED_SYRINGE | INTRAMUSCULAR | Status: DC
Start: 2024-02-10 — End: 2024-02-10
  Administered 2024-02-10: 40 mg via SUBCUTANEOUS
  Filled 2024-02-09: qty 0.4

## 2024-02-09 MED ORDER — HYDROMORPHONE HCL 1 MG/ML IJ SOLN: 0.5000 mg | INTRAMUSCULAR | Status: DC | PRN

## 2024-02-09 MED ORDER — ACETAMINOPHEN 500 MG PO TABS
1000.0000 mg | ORAL_TABLET | Freq: Four times a day (QID) | ORAL | Status: DC
  Administered 2024-02-09 – 2024-02-10 (×3): 1000 mg via ORAL
  Filled 2024-02-09 (×3): qty 2

## 2024-02-09 MED ORDER — STERILE WATER FOR IRRIGATION IR SOLN
Status: DC | PRN
  Administered 2024-02-09: 1000 mL

## 2024-02-09 MED ORDER — ROSUVASTATIN CALCIUM 10 MG PO TABS
10.0000 mg | ORAL_TABLET | Freq: Every day | ORAL | Status: DC
  Administered 2024-02-09: 10 mg via ORAL
  Filled 2024-02-09: qty 1

## 2024-02-09 MED ORDER — SODIUM CHLORIDE 0.9% FLUSH: 3.0000 mL | INTRAVENOUS | Status: DC | PRN

## 2024-02-09 MED ORDER — OXYCODONE HCL 5 MG PO TABS: 5.0000 mg | ORAL_TABLET | ORAL | Status: DC | PRN

## 2024-02-09 MED ORDER — CEFAZOLIN SODIUM-DEXTROSE 2-4 GM/100ML-% IV SOLN
2.0000 g | INTRAVENOUS | Status: AC
  Administered 2024-02-09: 2 g via INTRAVENOUS
  Filled 2024-02-09: qty 100

## 2024-02-09 MED ORDER — LACTATED RINGERS IV SOLN: INTRAVENOUS | Status: DC

## 2024-02-09 MED ORDER — HEPARIN SODIUM (PORCINE) 5000 UNIT/ML IJ SOLN
5000.0000 [IU] | INTRAMUSCULAR | Status: AC
  Administered 2024-02-09: 5000 [IU] via SUBCUTANEOUS
  Filled 2024-02-09: qty 1

## 2024-02-09 MED ORDER — NITROGLYCERIN 0.4 MG SL SUBL: 0.4000 mg | SUBLINGUAL_TABLET | SUBLINGUAL | Status: DC | PRN

## 2024-02-09 MED ORDER — INSULIN ASPART 100 UNIT/ML IJ SOLN
4.0000 [IU] | Freq: Three times a day (TID) | INTRAMUSCULAR | Status: DC
  Administered 2024-02-09 – 2024-02-10 (×2): 4 [IU] via SUBCUTANEOUS

## 2024-02-09 MED ORDER — METOPROLOL TARTRATE 25 MG PO TABS
25.0000 mg | ORAL_TABLET | Freq: Two times a day (BID) | ORAL | Status: DC
  Administered 2024-02-09 – 2024-02-10 (×2): 25 mg via ORAL
  Filled 2024-02-09 (×2): qty 1

## 2024-02-09 MED ORDER — LIDOCAINE HCL (CARDIAC) PF 100 MG/5ML IV SOSY
PREFILLED_SYRINGE | INTRAVENOUS | Status: DC | PRN
  Administered 2024-02-09: 60 mg via INTRAVENOUS

## 2024-02-09 MED ORDER — SENNOSIDES-DOCUSATE SODIUM 8.6-50 MG PO TABS
2.0000 | ORAL_TABLET | Freq: Every day | ORAL | Status: DC
  Filled 2024-02-09: qty 2

## 2024-02-09 MED ORDER — DEXAMETHASONE SODIUM PHOSPHATE 10 MG/ML IJ SOLN
4.0000 mg | INTRAMUSCULAR | Status: AC
  Administered 2024-02-09: 8 mg via INTRAVENOUS

## 2024-02-09 MED ORDER — BUPIVACAINE LIPOSOME 1.3 % IJ SUSP
INTRAMUSCULAR | Status: DC | PRN
  Administered 2024-02-09: 20 mL

## 2024-02-09 MED ORDER — ORAL CARE MOUTH RINSE: 15.0000 mL | Freq: Once | OROMUCOSAL | Status: AC

## 2024-02-09 MED ORDER — ONDANSETRON HCL 4 MG/2ML IJ SOLN
INTRAMUSCULAR | Status: DC | PRN
  Administered 2024-02-09: 4 mg via INTRAVENOUS

## 2024-02-09 MED ORDER — PHENYLEPHRINE HCL-NACL 20-0.9 MG/250ML-% IV SOLN
INTRAVENOUS | Status: DC | PRN
  Administered 2024-02-09: 30 ug/min via INTRAVENOUS

## 2024-02-09 SURGICAL SUPPLY — 68 items
APPLICATOR SURGIFLO ENDO (HEMOSTASIS) IMPLANT
BAG LAPAROSCOPIC 12 15 PORT 16 (BASKET) IMPLANT
BLADE SURG SZ10 CARB STEEL (BLADE) IMPLANT
COVER BACK TABLE 60X90IN (DRAPES) ×1 IMPLANT
COVER TIP SHEARS 8 DVNC (MISCELLANEOUS) ×1 IMPLANT
DERMABOND ADVANCED .7 DNX12 (GAUZE/BANDAGES/DRESSINGS) ×1 IMPLANT
DRAPE ARM DVNC X/XI (DISPOSABLE) ×4 IMPLANT
DRAPE COLUMN DVNC XI (DISPOSABLE) ×1 IMPLANT
DRAPE SHEET LG 3/4 BI-LAMINATE (DRAPES) ×1 IMPLANT
DRAPE SURG IRRIG POUCH 19X23 (DRAPES) ×1 IMPLANT
DRIVER NDL MEGA SUTCUT DVNCXI (INSTRUMENTS) ×1 IMPLANT
DRIVER NDLE MEGA SUTCUT DVNCXI (INSTRUMENTS) ×1 IMPLANT
DRSG OPSITE POSTOP 4X6 (GAUZE/BANDAGES/DRESSINGS) IMPLANT
DRSG OPSITE POSTOP 4X8 (GAUZE/BANDAGES/DRESSINGS) IMPLANT
DRSG TEGADERM 6X8 (GAUZE/BANDAGES/DRESSINGS) IMPLANT
ELECT PENCIL ROCKER SW 15FT (MISCELLANEOUS) IMPLANT
ELECT REM PT RETURN 15FT ADLT (MISCELLANEOUS) ×1 IMPLANT
FORCEPS BPLR FENES DVNC XI (FORCEP) ×1 IMPLANT
FORCEPS PROGRASP DVNC XI (FORCEP) ×1 IMPLANT
GAUZE 4X4 16PLY ~~LOC~~+RFID DBL (SPONGE) ×1 IMPLANT
GLOVE BIO SURGEON STRL SZ 6.5 (GLOVE) ×1 IMPLANT
GLOVE BIOGEL PI IND STRL 6.5 (GLOVE) ×2 IMPLANT
GLOVE BIOGEL PI MICRO STRL 6 (GLOVE) ×4 IMPLANT
GOWN STRL REUS W/ TWL LRG LVL3 (GOWN DISPOSABLE) ×4 IMPLANT
GRASPER SUT TROCAR 14GX15 (MISCELLANEOUS) IMPLANT
HOLDER FOLEY CATH W/STRAP (MISCELLANEOUS) IMPLANT
IRRIGATION SUCT STRKRFLW 2 WTP (MISCELLANEOUS) ×1 IMPLANT
KIT PROCEDURE DVNC SI (MISCELLANEOUS) IMPLANT
KIT TURNOVER KIT A (KITS) IMPLANT
LIGASURE IMPACT 36 18CM CVD LR (INSTRUMENTS) IMPLANT
MANIPULATOR ADVINCU DEL 3.0 PL (MISCELLANEOUS) IMPLANT
MANIPULATOR ADVINCU DEL 3.5 PL (MISCELLANEOUS) IMPLANT
MANIPULATOR UTERINE 4.5 ZUMI (MISCELLANEOUS) IMPLANT
NDL HYPO 21X1.5 SAFETY (NEEDLE) ×1 IMPLANT
NDL INSUFFLATION 14GA 120MM (NEEDLE) IMPLANT
NDL SPNL 20GX3.5 QUINCKE YW (NEEDLE) IMPLANT
NEEDLE HYPO 21X1.5 SAFETY (NEEDLE) ×1 IMPLANT
NEEDLE INSUFFLATION 14GA 120MM (NEEDLE) IMPLANT
NEEDLE SPNL 20GX3.5 QUINCKE YW (NEEDLE) IMPLANT
OBTURATOR OPTICALSTD 8 DVNC (TROCAR) ×1 IMPLANT
PACK ROBOT GYN CUSTOM WL (TRAY / TRAY PROCEDURE) ×1 IMPLANT
PAD ARMBOARD POSITIONER FOAM (MISCELLANEOUS) ×1 IMPLANT
PAD POSITIONING PINK XL (MISCELLANEOUS) ×1 IMPLANT
PORT ACCESS TROCAR AIRSEAL 12 (TROCAR) IMPLANT
SCISSORS MNPLR CVD DVNC XI (INSTRUMENTS) ×1 IMPLANT
SCRUB CHG 4% DYNA-HEX 4OZ (MISCELLANEOUS) ×2 IMPLANT
SEAL UNIV 5-12 XI (MISCELLANEOUS) ×4 IMPLANT
SET TRI-LUMEN FLTR TB AIRSEAL (TUBING) ×1 IMPLANT
SPIKE FLUID TRANSFER (MISCELLANEOUS) ×1 IMPLANT
SPONGE T-LAP 18X18 ~~LOC~~+RFID (SPONGE) IMPLANT
SURGIFLO W/THROMBIN 8M KIT (HEMOSTASIS) IMPLANT
SUT MNCRL AB 4-0 PS2 18 (SUTURE) IMPLANT
SUT PDS AB 1 TP1 54 (SUTURE) IMPLANT
SUT VIC AB 0 CT1 27XBRD ANTBC (SUTURE) IMPLANT
SUT VIC AB 2-0 CT1 TAPERPNT 27 (SUTURE) IMPLANT
SUT VIC AB 2-0 SH 27X BRD (SUTURE) IMPLANT
SUT VIC AB 4-0 PS2 18 (SUTURE) ×2 IMPLANT
SUT VICRYL 0 27 CT2 27 ABS (SUTURE) ×1 IMPLANT
SYR 10ML LL (SYRINGE) IMPLANT
SYR BULB IRRIG 60ML STRL (SYRINGE) IMPLANT
SYSTEM BAG RETRIEVAL 10MM (BASKET) IMPLANT
SYSTEM WOUND ALEXIS 18CM MED (MISCELLANEOUS) IMPLANT
TRAP SPECIMEN MUCUS 40CC (MISCELLANEOUS) IMPLANT
TRAY FOLEY MTR SLVR 16FR STAT (SET/KITS/TRAYS/PACK) ×1 IMPLANT
TROCAR PORT AIRSEAL 5X120 (TROCAR) IMPLANT
UNDERPAD 30X36 HEAVY ABSORB (UNDERPADS AND DIAPERS) ×2 IMPLANT
WATER STERILE IRR 1000ML POUR (IV SOLUTION) ×1 IMPLANT
YANKAUER SUCT BULB TIP 10FT TU (MISCELLANEOUS) IMPLANT

## 2024-02-09 NOTE — H&P (Signed)
 Brief Pre-operative History & Physical  Patient name: Ebony Matthews CSN: [id number] MRN: [id number] Admit Date: 02/09/2024 Date of Surgery: 02/09/2024 Performing Service: Gynecology   Code Status: Full Code    Assessment & Plan    Ebony Matthews is a 43 y.o. female with OVARIAN MASS, who presents for: Procedure(s) (LRB): SALPINGO-OOPHORECTOMY, BILATERAL, ROBOT-ASSISTED (N/A).   Consent obtained in office is accurate. Risks, benefits, and alternatives to surgery were reviewed, and all questions were answered.  Proceed to the OR as planned.     History of Present Illness:  Ebony Matthews is a 43 y.o. female with OVARIAN MASS. She was recently seen in clinic, where a detailed HPI can be found. She was noted to benefit from: Procedure(s) (LRB): SALPINGO-OOPHORECTOMY, BILATERAL, ROBOT-ASSISTED (N/A).   Medical History Past Medical History:  Diagnosis Date   Diabetes mellitus without complication (HCC)    Hyperlipidemia    Hypertension    Loop recorder [employer] 12/14/2020 12/14/2020   Scheduled Remote loop recorder check 12/14/2020: NSR. New implant.    Stroke [facility])    left sided weakness   TIA (transient ischemic attack) 01/22/2019   Surgical History Past Surgical History:  Procedure Laterality Date   LOOP RECORDER IMPLANT     Allergies Patient has no known allergies.  Medications   Current Facility-Administered Medications  Medication Dose Route Frequency Provider Last Rate Last Admin   ceFAZolin (ANCEF) IVPB 2g/100 mL premix  2 g Intravenous On Call to OR Cross, Melissa D, NP       dexamethasone (DECADRON) injection 4 mg  4 mg Intravenous On Call to OR Cross, Lawernce Presto, NP       lactated ringers  infusion   Intravenous Continuous Leslye Rast, MD       metroNIDAZOLE  (FLAGYL ) IVPB 500 mg  500 mg Intravenous On Call to OR Cross, Melissa D, NP        Vital Signs BP (!) 169/89   Pulse 68   Temp 98.2 F (36.8 C) (Oral)   Resp 16   Ht 5\' 7"  (1.702 m)   Wt  211 lb (95.7 kg)   SpO2 99%   BMI 33.05 kg/m  Facility age limit for growth %iles is 20 years. Facility age limit for growth %iles is 20 years..   Physical Exam General: Well developed, appears stated age, in no acute distress  Mental status: Alert and oriented x3 Cardiovascular: Normal Pulmonary: Symmetric chest rise, unlabored breathing Relevant System for Surgery: Surgical site examination deferred to the OR   Labs and Studies: Lab Results  Component Value Date   WBC 6.0 02/04/2024   HGB 10.5 (L) 02/04/2024   HCT 35.4 (L) 02/04/2024   PLT 221 02/04/2024    Lab Results  Component Value Date   INR 1.1 11/06/2023   APTT 33 11/06/2023   \

## 2024-02-09 NOTE — Anesthesia Procedure Notes (Signed)
 Procedure Name: Intubation Date/Time: 02/09/2024 9:02 AM  Performed by: Raylene Calamity, CRNAPre-anesthesia Checklist: Patient identified, Emergency Drugs available, Suction available and Patient being monitored Patient Re-evaluated:Patient Re-evaluated prior to induction Oxygen Delivery Method: Circle System Utilized Preoxygenation: Pre-oxygenation with 100% oxygen Induction Type: IV induction Ventilation: Mask ventilation without difficulty Laryngoscope Size: Mac and 3 Grade View: Grade I Tube type: Oral Number of attempts: 1 Airway Equipment and Method: Stylet and Oral airway Placement Confirmation: ETT inserted through vocal cords under direct vision, positive ETCO2 and breath sounds checked- equal and bilateral Secured at: 21 cm Tube secured with: Tape Dental Injury: Teeth and Oropharynx as per pre-operative assessment

## 2024-02-09 NOTE — Progress Notes (Signed)
 Patient's last two blood pressures were running high - 179/95 and 177/101. Dr. Daisey Dryer is aware, and said to continue monitoring.

## 2024-02-09 NOTE — OR Nursing (Signed)
 Patient partially paralyzed and has difficulty emptying bladder. Serum Hcg sent to lab. Patient took 2 u of Humalog insulin  at home this am at 0600-periop glycemic protocol not initiated.

## 2024-02-09 NOTE — Transfer of Care (Signed)
 Immediate Anesthesia Transfer of Care Note  Patient: Ebony Matthews  Procedure(s) Performed: ROBOTIC-ASSISTED TOTAL LAPAROSCOPIC HYSTERCTOMY, BILATERAL SALPINGO-OOPHORECTOMY, MINI LAPAROTOMY FOR CONTROLLED CYST DRAINAGE, PERITONEAL AND OMENTAL BIOPSIES  Patient Location: PACU  Anesthesia Type:General  Level of Consciousness: sedated  Airway & Oxygen Therapy: Patient Spontanous Breathing  Post-op Assessment: Report given to RN  Post vital signs: Reviewed and stable  Last Vitals:  Vitals Value Taken Time  BP 135/79 02/09/24 1218  Temp    Pulse 79 02/09/24 1226  Resp 22 02/09/24 1226  SpO2 100 % 02/09/24 1226  Vitals shown include unfiled device data.  Last Pain:  Vitals:   02/09/24 0644  TempSrc: Oral  PainSc:          Complications: No notable events documented.

## 2024-02-09 NOTE — Op Note (Addendum)
 GYNECOLOGIC ONCOLOGY OPERATIVE NOTE  Date of Service: 02/09/2024  Preoperative Diagnosis: Complex ovarian mass  Postoperative Diagnosis: Complex right ovarian mass  Procedures: Robotic assisted total laparoscopic hysterectomy, bilateral salpingo-oophorectomy, minilaparotomy for controlled cyst drainage, peritoneal and omental biopsies (Modifer 22: increased duration of the procedure by >50min due to complexity due size of adnexal mass requiring minilaparotomy and drainage for improved visualization as well as additional peritoneal biopsies obtained given frozen pathology results)  Surgeon: Derrel Flies, MD  Assistants: Abdul Hodgkin, MD and (an MD assistant was necessary for tissue manipulation, management of robotic instrumentation, retraction and positioning due to the complexity of the case and hospital policies)  Anesthesia: General  Estimated Blood Loss: 10 mL    Fluids: 1000 ml, crystalloid  Urine Output: 600 ml, clear yellow  Findings: On entry to abdomen, normal upper abdominal survey with smooth diaphragm, liver, stomach and normal appearing omentum and bowel. Smooth ovarian mass. Serous fluid drained from mass in a controlled fashion. In pelvis filmy adhesions of omentum and rectosigmoid colon to left pelvic sidewall and adnexa. Normal appearing left ovary and fallopian tube. Normal uterus with few small subserosal fibroids. 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    Specimens:  ID Type Source Tests Collected by Time Destination  1 : Right Tube and Ovary Tissue PATH Gyn tumor resection SURGICAL PATHOLOGY Derrel Flies, MD 02/09/2024 1003   2 : uterus, cervix, left tube and ovary Tissue PATH Other SURGICAL PATHOLOGY Derrel Flies, MD 02/09/2024 1109   3 : Anterior Peritoneal Biopsy Tissue PATH Gyn biopsy SURGICAL PATHOLOGY Derrel Flies, MD 02/09/2024 1138   4 :  Right Pelvic Peritoneal Biopsy Tissue PATH Gyn biopsy SURGICAL PATHOLOGY Derrel Flies, MD 02/09/2024 1139   5 : Left Pelvic Peritoneal Biopsy Tissue PATH Gyn biopsy SURGICAL PATHOLOGY Derrel Flies, MD 02/09/2024 1143   6 : Posterior [place] Biopsy Tissue PATH Gyn biopsy SURGICAL PATHOLOGY Derrel Flies, MD 02/09/2024 1144   7 : Right Paracolic Gutter Biopsy Tissue PATH Gyn biopsy SURGICAL PATHOLOGY Derrel Flies, MD 02/09/2024 1145   8 : Left Paracolic Gutter Biopsy Tissue PATH Gyn biopsy SURGICAL PATHOLOGY Derrel Flies, MD 02/09/2024 1145   9 : Omental Biopsy Tissue PATH Gyn biopsy SURGICAL PATHOLOGY Derrel Flies, MD 02/09/2024 1145   A : Pelvic Washings Body Fluid PATH Cytology Pelvic Washing CYTOLOGY - NON PAP Derrel Flies, MD 02/09/2024 [phone]     Complications:  None  Indications for Procedure: Ebony Matthews is a 43 y.o. woman with a large complex adnexal mass.  Prior to the procedure, all risks, benefits, and alternatives were discussed and informed surgical consent was signed.  Procedure: Patient was taken to the operating room where general anesthesia was achieved.  She was positioned in dorsal lithotomy and prepped and draped.  A foley catheter was inserted into the bladder.  A hulka manipulator was secured in the cervix.    A 5 mm incision was made in the left upper quadrant near Palmer's point.  The abdomen was entered with a 5 mm OptiView trocar under direct visualization.  The abdomen was insufflated and surveyed with the findings as noted above.  With insufflation in place, a 4cm vertical midline incision was made, Inferior to the umbilicus, with the scalpel and the abdomen was entered sharply. An alexis retractor was placed. Washings were obtained. A pursestring stitch of 2-0 vicryl was placed in the cyst wall, and using a gallbladder  trocar, the cyst was drained in a controlled fashion. The gallbladder trocar was removed and the pursestring stitch tied  down. The cap was then placed on the Alexis retractor and a robotic trocar placed through the cap. The abdomen was re-insufflated.   The patient was placed in steep Trendelenburg, and additional trocars were placed as follows: one 8 mm robotic trocar in the right abdomen, and one 8 mm robotic trocar in the left abdomen.  The left upper quadrant trocar was removed and replaced with a 5 mm airseal trocar.  All trocars were placed under direct visualization.  The bowels were moved into the upper abdomen.  The DaVinci robotic surgical system was brought to the patient's bedside and docked.  The right pelvic sidewall peritoneum was incised and the retroperitoneum entered.  The right ureter was identified.  The right infundibulopelvic ligament was isolated, cauterized, and transected.  The broad ligament was incised to the uterine cornu.  The utero-ovarian ligament and the proximal fallopian tube were isolated, cauterized, and transected. The right ovarian mass was grasped through the assistant trocar. The robotic instruments were removed. The robot trocar was removed from the alexis cap and the cap was removed. The right ovarian mass was grasped through the midline incision and handed off the field for frozen pathology. 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 Given possible malignant process in the setting of patient's age as well as medical comorbidities, to minimize need for a second surgery, decision made to proceed with completion hysterectomy and left salpingo-oophorectomy. The cap was replaced on the alexis. The robotic trocar was reintroduced. The abdomen was re-insufflated. The robotic instruments were reintroduced.  Filmy adhesions of the omentum and the rectosigmoid colon to the left pelvic sidewall were lysed. The left round ligament was cauterized and transected. The left retroperitoneum was entered and the left ureter identified.  The left infundibulopelvic ligament was isolated, cauterized, and transected. The posterior peritoneum was opened to the colpotomy ring. The anterior peritoneum was opened and the bladder flap was created. The left uterine artery was skeletonized, cauterized, and transected at the level of the colpotomy ring. Additional cautery was used in a C-shaped fashion to allow the remainder of the broad, cardinal, and uterosacral ligaments with the uterine vessels to be transected and fall away from the colpotomy ring.  A similar procedure was performed on the contralateral side. A colpotomy was made circumferentially following the contours of the colpotomy ring.  The uterine specimen was removed through the vagina.   The vaginal cuff was closed with a running stitch of 0 Vicryl suture.  Peritoneal biopsies were obtained of the anterior cul-de-sac, posterior cul-de-sac, right pelvic sidewall, left pelvic sidewall, right para-colic gutter and left para-colic gutter. At each location the peritoneum was incised with electrocautery and dissected off of the underlying tissue with blunt dissection and electrocautery. Each specimen was removed through the assistant trocar. The distal omentum was then grasped and elevated. A biopsy was obtained by serially cauterizing and transecting the omentum to remove a portion of the distal omentum. This was grasped through the assistant trocar. The pelvis was irrigated and all operative sites were found to be hemostatic.  All instruments were removed and the robot was taken from the patient's bedside.  The alexis cap was removed, and the omental biopsy specimen was removed through the midline incision. The abdomen was desufflated. The fascia was closed with a running stitch of #1 PDS. Exparel was injected at  the incision site in standard fashion. The subcutaneous tissues were irrigated and hemostasis achieved. The subcutaneous space was approximated with 2-0 Vicryl in a running fashion.  The skin was closed with a deep dermal stitch of 4-0 vicryl, 4-0 monocryl in a subcuticular fashion followed by surgical glue.   All ports were removed. The skin at all laparoscopic incisions was closed with 4-0 Vicryl to reapproximate the subcutaneous tissue and 4-0 monocryl in a subcuticular fashion followed by surgical glue.  Patient tolerated the procedure well. Sponge, lap, and instrument counts were correct. 2 gm of Ancef and 500mg  metronidazole  was administered prior to skin incision for routine perioperative antibiotics.  She was extubated and taken to the PACU in stable condition.  Derrel Flies, MD Gynecologic Oncology

## 2024-02-10 ENCOUNTER — Encounter (HOSPITAL_COMMUNITY): Payer: Self-pay | Admitting: Psychiatry

## 2024-02-10 ENCOUNTER — Ambulatory Visit: Payer: Self-pay | Admitting: Cardiology

## 2024-02-10 DIAGNOSIS — D3911 Neoplasm of uncertain behavior of right ovary: Secondary | ICD-10-CM | POA: Diagnosis not present

## 2024-02-10 LAB — BASIC METABOLIC PANEL WITH GFR
Anion gap: 8 (ref 5–15)
BUN: 17 mg/dL (ref 6–20)
CO2: 24 mmol/L (ref 22–32)
Calcium: 9.2 mg/dL (ref 8.9–10.3)
Chloride: 105 mmol/L (ref 98–111)
Creatinine, Ser: 1.45 mg/dL — ABNORMAL HIGH (ref 0.44–1.00)
GFR, Estimated: 46 mL/min — ABNORMAL LOW (ref >60)
Glucose, Bld: 198 mg/dL — ABNORMAL HIGH (ref 70–99)
Potassium: 3.4 mmol/L — ABNORMAL LOW (ref 3.5–5.1)
Sodium: 137 mmol/L (ref 135–145)

## 2024-02-10 LAB — CBC
HCT: 33.3 % — ABNORMAL LOW (ref 36.0–46.0)
Hemoglobin: 10.1 g/dL — ABNORMAL LOW (ref 12.0–15.0)
MCH: 26.8 pg (ref 26.0–34.0)
MCHC: 30.3 g/dL (ref 30.0–36.0)
MCV: 88.3 fL (ref 80.0–100.0)
Platelets: 215 10*3/uL (ref 150–400)
RBC: 3.77 MIL/uL — ABNORMAL LOW (ref 3.87–5.11)
RDW: 13.4 % (ref 11.5–15.5)
WBC: 10.2 10*3/uL (ref 4.0–10.5)
nRBC: 0 % (ref 0.0–0.2)

## 2024-02-10 LAB — GLUCOSE, CAPILLARY: Glucose-Capillary: 205 mg/dL — ABNORMAL HIGH (ref 70–99)

## 2024-02-10 NOTE — Discharge Summary (Signed)
 Physician Discharge Summary  Patient ID: Ebony Matthews MRN: [id number] DOB/AGE: Sep 01, 1981 43 y.o.  Admit date: 02/09/2024 Discharge date: 02/10/2024  Admission Diagnoses: Ovarian mass  Discharge Diagnoses:  Principal Problem:   Ovarian mass Active Problems:   Pelvic mass in female   Discharged Condition:  The patient is in good condition and stable for discharge.    Hospital Course: On 02/09/2024, the patient underwent the following: Procedure(s): ROBOTIC-ASSISTED TOTAL LAPAROSCOPIC HYSTERCTOMY, BILATERAL SALPINGO-OOPHORECTOMY, MINI LAPAROTOMY FOR CONTROLLED CYST DRAINAGE, PERITONEAL AND OMENTAL BIOPSIES. The postoperative course was uneventful.  She was discharged to home on postoperative day 1 tolerating a regular diet, pain minimum, ambulating, voiding.   Consults: None  Significant Diagnostic Studies: Labs  Treatments: Surgery: see above  Discharge Exam: Blood pressure (!) 153/84, pulse 79, temperature 98 F (36.7 C), temperature source Oral, resp. rate 17, height 5\' 7"  (1.702 m), weight 211 lb (95.7 kg), SpO2 100%. General appearance: alert, cooperative, and no distress Resp: clear to auscultation bilaterally Cardio: regular rate and rhythm, S1, S2 normal, no murmur, click, rub or gallop GI: abdomen soft, mildly hypoactive bowel sounds, non-distended, non tender  Extremities: extremities normal, atraumatic, no cyanosis or edema Incision/Wound: lap sites to the abdomen along with midline mini lap incision with dermabond intact with no active drainage  Disposition: Discharge disposition: 01-Home or Self Care       Discharge Instructions     Call MD for:  difficulty breathing, headache or visual disturbances   Complete by: As directed    Call MD for:  extreme fatigue   Complete by: As directed    Call MD for:  hives   Complete by: As directed    Call MD for:  persistant dizziness or light-headedness   Complete by: As directed    Call MD for:  persistant  nausea and vomiting   Complete by: As directed    Call MD for:  redness, tenderness, or signs of infection (pain, swelling, redness, odor or green/yellow discharge around incision site)   Complete by: As directed    Call MD for:  severe uncontrolled pain   Complete by: As directed    Call MD for:  temperature >100.4   Complete by: As directed    Diet - low sodium heart healthy   Complete by: As directed    Driving Restrictions   Complete by: As directed    No driving for 1-19 days until the following criteria have been met and if you were cleared to drive before surgery: Do not take narcotics and drive. You need to make sure your reaction time has returned.   Increase activity slowly   Complete by: As directed    Lifting restrictions   Complete by: As directed    No lifting greater than 10 lbs, pushing, pulling, straining for 6 weeks.   Sexual Activity Restrictions   Complete by: As directed    No sexual activity, nothing in the vagina, for 12 weeks.      Allergies as of 02/10/2024   No Known Allergies      Medication List     TAKE these medications    BD Pen Needle Micro U/F 32G X 6 MM Misc Generic drug: Insulin  Pen Needle Inject into the skin as directed.   folic acid  1 MG tablet Commonly known as: FOLVITE  Take 1 tablet (1 mg total) by mouth daily.   insulin  lispro 100 UNIT/ML KwikPen Commonly known as: HUMALOG INJECT AS PER SLIDING SCALE: IF 0-120 =  0 UNITS; 121-150 = 1 UNIT; 151-200 =2 UNITS; 201-250 = 3 UNITS; 251-300 = 5 UNITS; 301-350 = 7 UNITS; 351-400 = 9 UNITS OVER 401 CALL MD FOR FURTHER ORDERS. INJECT SUBCUTANEOUSLY WITH MEALS RELATED TO TYPE DIABETES   losartan  25 MG tablet Commonly known as: COZAAR  Take 1 tablet (25 mg total) by mouth daily.   metoprolol  tartrate 25 MG tablet Commonly known as: LOPRESSOR  Take 1 tablet (25 mg total) by mouth 2 (two) times daily.   nitroGLYCERIN  0.4 MG SL tablet Commonly known as: NITROSTAT  Place 0.4 mg under the  tongue every 5 (five) minutes as needed for chest pain.   rosuvastatin 10 MG tablet Commonly known as: CRESTOR Take 10 mg by mouth at bedtime.   senna-docusate 8.6-50 MG tablet Commonly known as: Senokot-S Take 2 tablets by mouth at bedtime. For AFTER surgery, do not take if having diarrhea   traMADol 50 MG tablet Commonly known as: ULTRAM Take 1 tablet (50 mg total) by mouth every 12 (twelve) hours as needed for severe pain (pain score 7-10). For AFTER surgery only, do not take and drive        Follow-up Information     Derrel Flies, MD Follow up on 02/29/2024.   Specialty: Gynecologic Oncology Why: at 1:45pm at the [facility] for post-op check Contact information: [address] [place] [address] [phone]                 Greater than thirty minutes were spend for face to face discharge instructions and discharge orders/summary in EPIC.   Signed: Suellyn Emory 02/10/2024, 9:27 AM

## 2024-02-10 NOTE — Progress Notes (Signed)
 Patient was given discharge instructions, and all questions were answered.  Patient was stable for discharge and was taken to the main exit by wheelchair.

## 2024-02-10 NOTE — Progress Notes (Signed)
   02/10/24 1053  TOC Brief Assessment  Insurance and Status Reviewed  Patient has primary care physician Yes  Home environment has been reviewed resides in an apartment  Prior level of function: Independent  Prior/Current Home Services No current home services  Social Drivers of Health Review SDOH reviewed no interventions necessary  Transition of care needs no transition of care needs at this time

## 2024-02-10 NOTE — Anesthesia Postprocedure Evaluation (Signed)
 Anesthesia Post Note  Patient: Ebony Matthews  Procedure(s) Performed: ROBOTIC-ASSISTED TOTAL LAPAROSCOPIC HYSTERCTOMY, BILATERAL SALPINGO-OOPHORECTOMY, MINI LAPAROTOMY FOR CONTROLLED CYST DRAINAGE, PERITONEAL AND OMENTAL BIOPSIES     Patient location during evaluation: PACU Anesthesia Type: General Level of consciousness: awake and alert Pain management: pain level controlled Vital Signs Assessment: post-procedure vital signs reviewed and stable Respiratory status: spontaneous breathing, nonlabored ventilation, respiratory function stable and patient connected to nasal cannula oxygen Cardiovascular status: blood pressure returned to baseline and stable Postop Assessment: no apparent nausea or vomiting Anesthetic complications: no   No notable events documented.  Last Vitals:  Vitals:   02/10/24 0534 02/10/24 0858  BP: 131/77 (!) 153/84  Pulse: 88 79  Resp: 16 17  Temp: 37.1 C 36.7 C  SpO2: 99% 100%    Last Pain:  Vitals:   02/10/24 0858  TempSrc: Oral  PainSc:                  Maytte Jacot S

## 2024-02-11 ENCOUNTER — Telehealth: Payer: Self-pay | Admitting: *Deleted

## 2024-02-11 LAB — SURGICAL PATHOLOGY

## 2024-02-11 LAB — CYTOLOGY - NON PAP

## 2024-02-11 LAB — TESTOSTERONE,FREE AND TOTAL
Testosterone, Free: 0.8 pg/mL (ref 0.0–4.2)
Testosterone: 25 ng/dL (ref 4–50)

## 2024-02-11 NOTE — Telephone Encounter (Signed)
 Spoke with Ms. Biddick this morning. She states she is eating, drinking and urinating well. She has not had a BM yet but is passing gas. She is taking senokot as prescribed and encouraged her to drink plenty of water. She denies fever or chills. Incisions are dry and intact. She rates her pain 4/10. Her pain is controlled with tramadol.     Instructed to call office with any fever, chills, purulent drainage, uncontrolled pain or any other questions or concerns. Patient verbalizes understanding.   Pt aware of post op appointments as well as the office number [phone] and after hours number [phone] to call if she has any questions or concerns

## 2024-02-14 ENCOUNTER — Ambulatory Visit: Payer: Self-pay | Admitting: Cardiology

## 2024-02-15 ENCOUNTER — Ambulatory Visit: Payer: Self-pay | Admitting: Psychiatry

## 2024-02-15 ENCOUNTER — Encounter: Admitting: Psychiatry

## 2024-02-17 NOTE — Addendum Note (Signed)
 Addended by: Edra Govern D on: 02/17/2024 10:04 AM   Modules accepted: Orders

## 2024-02-17 NOTE — Progress Notes (Signed)
 [employer]

## 2024-02-29 ENCOUNTER — Encounter: Payer: Self-pay | Admitting: Psychiatry

## 2024-02-29 ENCOUNTER — Inpatient Hospital Stay: Attending: Psychiatry

## 2024-02-29 ENCOUNTER — Inpatient Hospital Stay: Attending: Psychiatry | Admitting: Psychiatry

## 2024-02-29 VITALS — BP 114/64 | HR 74 | Temp 98.7°F | Resp 16 | Ht 67.0 in | Wt 194.6 lb

## 2024-02-29 DIAGNOSIS — D3910 Neoplasm of uncertain behavior of unspecified ovary: Secondary | ICD-10-CM | POA: Insufficient documentation

## 2024-02-29 DIAGNOSIS — Z90710 Acquired absence of both cervix and uterus: Secondary | ICD-10-CM | POA: Diagnosis not present

## 2024-02-29 DIAGNOSIS — R978 Other abnormal tumor markers: Secondary | ICD-10-CM | POA: Insufficient documentation

## 2024-02-29 DIAGNOSIS — C561 Malignant neoplasm of right ovary: Secondary | ICD-10-CM

## 2024-02-29 DIAGNOSIS — Z90722 Acquired absence of ovaries, bilateral: Secondary | ICD-10-CM | POA: Insufficient documentation

## 2024-02-29 DIAGNOSIS — Z7189 Other specified counseling: Secondary | ICD-10-CM

## 2024-02-29 DIAGNOSIS — N838 Other noninflammatory disorders of ovary, fallopian tube and broad ligament: Secondary | ICD-10-CM

## 2024-02-29 NOTE — Progress Notes (Signed)
 Gynecologic Oncology Return Clinic Visit  Date of Service: 02/29/2024 Referring Provider: Avie Boeck, MD   Assessment & Plan: Ebony Matthews is a 43 y.o. woman with a complex adnexal mass who is s/p RA-TLH, BSO, minilap for controlled cyst drainage, peritoneal and omental biopsies on 02/09/24, final pathology showing Stage IA adult granulosa cell tumor.  Postop: - Pt recovering well from surgery and healing appropriately postoperatively - Ongoing postoperative expectations and precautions reviewed. Continue with no lifting >10lbs through 6 weeks postoperatively  Granulosa cell tumor of ovary: - Intraoperative findings and pathology results reviewed. - Recommend observation. - Unclear if inhibin B is a marker for her. Preop value 55.8 but pt was premenopausal so could have been wnl. Will recheck today and likely still plan for follow this for surveillance. - Signs/symptoms of recurrence reviewed. - Surveillance reviewed. Follow-up q6 months x2 years then yearly.    RTC 37mo.  Derrel Flies, MD Gynecologic Oncology   Medical Decision Making I personally spent  TOTAL 20 minutes face-to-face and non-face-to-face in the care of this patient, which includes all pre, intra, and post visit time on the date of service. The discussion of ovarian cancer is beyond the scope of routine postoperative care.   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 Smooth ovarian mass. Serous fluid drained from mass in a  controlled fashion. In pelvis filmy adhesions of omentum and rectosigmoid colon to left pelvic sidewall and adnexa. Normal appearing left ovary and fallopian tube. Normal uterus with few small subserosal fibroids. 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 RIGHT TUBE AND OVARY: - Ovary: Adult granulosa cell tumor - Fallopian tube: no significant pathologic changes - See oncology table  B. UTERUS, CERVIX, LEFT TUBE AND OVARY: - Cervix: Benign, nabothian cyst - Endometrium: Benign, no hyperplasia or malignancy identified - Myometrium: Leiomyomata - Ovary: Benign follicular cyst - Fallopian tube: No significant pathologic changes  C. ANTERIOR PERITONEAL BIOPSY: - Benign peritoneal tissue  D. RIGHT PELVIC PERITONEAL BIOPSY: - Benign peritoneal tissue  E. LEFT PELVIC PERITONEAL BIOPSY: - Benign peritoneal tissue  F. POSTERIOR CULDESAC BIOPSY: - Benign fibroadipose tissue  G. RIGHT PARACOLIC GUTTER BIOPSY: - Benign fibroadipose tissue  H. LEFT PARACOLIC GUTTER BIOPSY: - Benign fibroadipose tissue  I. OMENTAL BIOPSY: - Benign omental tissue   ONCOLOGY TABLE: SPECIMEN Procedure: Right salpingo-oophorectomy Specimen Integrity: Previously incised  TUMOR Tumor Site: Right ovary Tumor Size: Greatest dimension 5.5 cm Histologic Type: Granulosa cell tumor, adult type Histologic Grade: Not applicable Ovarian Surface Involvement:  Not identified Fallopian Tube Surface Involvement: Not identified Implants: Not applicable Other Tissue/ Organ  Involvement: Not applicable Largest Extrapelvic Peritoneal Focus: Not applicable Peritoneal/Ascitic Fluid Involvement: Malignant cells not identified Chemotherapy Response Score (CRS): Not applicable  REGIONAL LYMPH NODES Regional Lymph Nodes Status: Not applicable (no regional lymph nodes submitted or found)  DISTANT METASTASIS Distant Site(s) Involved, if applicable: Not applicable Ancillary Studies: Can be performed upon request Representative Tumor Block: A1 Comment(s): Immunohistochemical stains reveal tumor cells are positive for inhibin and calretinin, and negative for synaptophysin, CD56 keratin and EMA, and support the above interpretation.  Controls are adequate   Cytology (02/09/24): FINAL MICROSCOPIC DIAGNOSIS:  - No malignant cells identified      Interval History: Pt reports that she is recovering well from surgery. She is not needing anything for pain. She is eating and drinking well. She is voiding without issue and having regular bowel movements. No bleeding. Reports that she has to use the bathroom to void quickly after waking up but this is not new (from before surgery).   Past Medical/Surgical History: Past Medical History:  Diagnosis Date   Diabetes mellitus without complication (HCC)    Hyperlipidemia    Hypertension    Loop recorder [employer] 12/14/2020 12/14/2020   Scheduled Remote loop recorder check 12/14/2020: NSR. New implant.    Stroke [facility])    left sided weakness   TIA (transient ischemic attack) 01/22/2019    Past Surgical History:  Procedure Laterality Date   LOOP RECORDER IMPLANT     ROBOTIC ASSISTED BILATERAL SALPINGO OOPHERECTOMY N/A 02/09/2024   Procedure: ROBOTIC-ASSISTED TOTAL LAPAROSCOPIC HYSTERCTOMY, BILATERAL SALPINGO-OOPHORECTOMY, MINI LAPAROTOMY FOR CONTROLLED CYST DRAINAGE, PERITONEAL AND OMENTAL BIOPSIES;  Surgeon:  Derrel Flies, MD;  Location: [facility];  Service: Gynecology;  Laterality: N/A;  UNILATERAL SALPINGO OOPHORECTOMY WITH POSSIBLE BILATERAL OOPHORECTOMY, MINI LAPAROTOMY FOR CONTROLLED DRAINAGE, POSSIBLE STAGING    Family History  Problem Relation Age of Onset   Hypertension Maternal Grandmother    Diabetes Other    Colon cancer Neg Hx    Ovarian cancer Neg Hx    Endometrial cancer Neg Hx    Pancreatic cancer Neg Hx    Prostate cancer Neg Hx    Breast cancer Neg Hx     Social History   Socioeconomic History   Marital status: Single    Spouse name: Not on file   Number of children: 0   Years of education: Not on file   Highest education level: Not on file  Occupational History   Not on file  Tobacco Use   Smoking status: Former    Current packs/day: 0.00    Average packs/day: 1 pack/day for 15.0 years (15.0 ttl pk-yrs)    Types: Cigarettes    Start date: 2006    Quit date: 2021    Years since quitting: 4.4   Smokeless tobacco: Never  Vaping Use   Vaping status: Never Used  Substance and Sexual Activity   Alcohol use: No   Drug use: Not Currently    Types: Marijuana, Cocaine    Comment: occasional   Sexual activity: Yes    Partners: Male    Birth control/protection: None, Condom  Other Topics Concern   Not on file  Social History Narrative   Right handed   Lives with friend one story home   Social Drivers of Health   Financial Resource Strain: Not on file  Food Insecurity: No Food Insecurity (02/09/2024)   Hunger Vital Sign    Worried About Running Out of Food in the Last Year: Never true    Ran Out  of Food in the Last Year: Never true  Transportation Needs: No Transportation Needs (02/09/2024)   PRAPARE - Administrator, Civil Service (Medical): No    Lack of Transportation (Non-Medical): No  Physical Activity: Not on file  Stress: Not on file  Social Connections: Not on file    Current Medications:  Current Outpatient Medications:    BD PEN  NEEDLE MICRO U/F 32G X 6 MM MISC, Inject into the skin as directed., Disp: , Rfl:    folic acid  (FOLVITE ) 1 MG tablet, Take 1 tablet (1 mg total) by mouth daily., Disp: , Rfl:    insulin  lispro (HUMALOG) 100 UNIT/ML KwikPen, INJECT AS PER SLIDING SCALE: IF 0-120 = 0 UNITS; 121-150 = 1 UNIT; 151-200 =2 UNITS; 201-250 = 3 UNITS; 251-300 = 5 UNITS; 301-350 = 7 UNITS; 351-400 = 9 UNITS OVER 401 CALL MD FOR FURTHER ORDERS. INJECT SUBCUTANEOUSLY WITH MEALS RELATED TO TYPE DIABETES, Disp: , Rfl:    losartan  (COZAAR ) 25 MG tablet, Take 1 tablet (25 mg total) by mouth daily., Disp: 30 tablet, Rfl: 3   metoprolol  tartrate (LOPRESSOR ) 25 MG tablet, Take 1 tablet (25 mg total) by mouth 2 (two) times daily., Disp: 180 tablet, Rfl: 3   nitroGLYCERIN  (NITROSTAT ) 0.4 MG SL tablet, Place 0.4 mg under the tongue every 5 (five) minutes as needed for chest pain., Disp: , Rfl:    rosuvastatin  (CRESTOR ) 10 MG tablet, Take 10 mg by mouth at bedtime., Disp: , Rfl:    senna-docusate (SENOKOT-S) 8.6-50 MG tablet, Take 2 tablets by mouth at bedtime. For AFTER surgery, do not take if having diarrhea, Disp: 30 tablet, Rfl: 0  Review of Symptoms: Complete 10-system review is positive for: Urinary frequency, problems walking.  Physical Exam: BP 114/64 (BP Location: Right Arm, Patient Position: Sitting)   Pulse 74   Temp 98.7 F (37.1 C) (Oral)   Resp 16   Ht 5\' 7"  (1.702 m)   Wt 194 lb 9.6 oz (88.3 kg)   SpO2 100%   BMI 30.48 kg/m  General: Alert, oriented, no acute distress. HEENT: Normocephalic, atraumatic. Neck symmetric without masses. Sclera anicteric.  Chest: Normal work of breathing. Clear to auscultation bilaterally.   Cardiovascular: Regular rate and rhythm, no murmurs. Abdomen: Soft, nontender.  Normoactive bowel sounds.  No masses appreciated.  Well-healing incisions. Extremities: Grossly normal range of motion.  Warm, well perfused.  No edema bilaterally. Skin: No rashes or lesions noted. GU: Normal  appearing external genitalia without erythema, excoriation, or lesions.  Speculum exam reveals intact, well healing vaginal cuff.  Bimanual exam reveals intact vaginal cuff. Exam chaperoned by Kimberly [place], CMA   Laboratory & Radiologic Studies: Surgical pathology (02/09/24): A. RIGHT TUBE AND OVARY: - Ovary: Adult granulosa cell tumor - Fallopian tube: no significant pathologic changes - See oncology table  B. UTERUS, CERVIX, LEFT TUBE AND OVARY: - Cervix: Benign, nabothian cyst - Endometrium: Benign, no hyperplasia or malignancy identified - Myometrium: Leiomyomata - Ovary: Benign follicular cyst - Fallopian tube: No significant pathologic changes  C. ANTERIOR PERITONEAL BIOPSY: - Benign peritoneal tissue  D. RIGHT PELVIC PERITONEAL BIOPSY: - Benign peritoneal tissue  E. LEFT PELVIC PERITONEAL BIOPSY: - Benign peritoneal tissue  F. POSTERIOR CULDESAC BIOPSY: - Benign fibroadipose tissue  G. RIGHT PARACOLIC GUTTER BIOPSY: - Benign fibroadipose tissue  H. LEFT PARACOLIC GUTTER BIOPSY: - Benign fibroadipose tissue  I. OMENTAL BIOPSY: - Benign omental tissue   ONCOLOGY TABLE: SPECIMEN Procedure: Right salpingo-oophorectomy Specimen Integrity: Previously incised  TUMOR Tumor Site: Right ovary Tumor Size: Greatest dimension 5.5 cm Histologic Type: Granulosa cell tumor, adult type Histologic Grade: Not applicable Ovarian Surface Involvement: Not identified Fallopian Tube Surface Involvement: Not identified Implants: Not applicable Other Tissue/ Organ Involvement: Not applicable Largest Extrapelvic Peritoneal Focus: Not applicable Peritoneal/Ascitic Fluid Involvement: Malignant cells not identified Chemotherapy Response Score (CRS): Not applicable  REGIONAL LYMPH NODES Regional Lymph Nodes Status: Not applicable (no regional lymph nodes submitted or found)  DISTANT METASTASIS Distant Site(s) Involved, if applicable: Not applicable Ancillary Studies: Can be  performed upon request Representative Tumor Block: A1 Comment(s): Immunohistochemical stains reveal tumor cells are positive for inhibin and calretinin, and negative for synaptophysin, CD56 keratin and EMA, and support the above interpretation.  Controls are adequate   Cytology (02/09/24): FINAL MICROSCOPIC DIAGNOSIS:  - No malignant cells identified

## 2024-02-29 NOTE — Patient Instructions (Signed)
 It was a pleasure to see you in clinic today. - Healing well. No lifting until 6 weeks postop. Nothing in vagina until 10 weeks postop. - Return visit planned for 6 months  Thank you very much for allowing me to provide care for you today.  I appreciate your confidence in choosing our Gynecologic Oncology team at [facility].  If you have any questions about your visit today please call our office or send us  a MyChart message and we will get back to you as soon as possible.

## 2024-03-01 ENCOUNTER — Ambulatory Visit: Payer: Self-pay | Admitting: Psychiatry

## 2024-03-01 LAB — INHIBIN B: Inhibin B: <7 pg/mL

## 2024-03-10 ENCOUNTER — Ambulatory Visit (INDEPENDENT_AMBULATORY_CARE_PROVIDER_SITE_OTHER)

## 2024-03-10 DIAGNOSIS — G459 Transient cerebral ischemic attack, unspecified: Secondary | ICD-10-CM | POA: Diagnosis not present

## 2024-03-11 LAB — CUP PACEART REMOTE DEVICE CHECK
Date Time Interrogation Session: 20250612143243
Implantable Pulse Generator Implant Date: 20220318
Pulse Gen Model: 436066
Pulse Gen Serial Number: 94060471

## 2024-03-13 ENCOUNTER — Ambulatory Visit: Payer: Self-pay | Admitting: Cardiology

## 2024-03-28 NOTE — Progress Notes (Signed)
 [employer]

## 2024-04-11 ENCOUNTER — Ambulatory Visit

## 2024-04-11 DIAGNOSIS — G459 Transient cerebral ischemic attack, unspecified: Secondary | ICD-10-CM

## 2024-04-11 LAB — CUP PACEART REMOTE DEVICE CHECK
Date Time Interrogation Session: 20250714123440
Implantable Pulse Generator Implant Date: 20220318
Pulse Gen Model: 436066
Pulse Gen Serial Number: 94060471

## 2024-04-17 ENCOUNTER — Ambulatory Visit: Payer: Self-pay | Admitting: Cardiology

## 2024-05-04 NOTE — Progress Notes (Signed)
 [facility]link Summary Report / Loop Recorder

## 2024-05-04 NOTE — Addendum Note (Signed)
 Addended by: VICCI SELLER A on: 05/04/2024 08:22 AM   Modules accepted: Orders

## 2024-05-10 ENCOUNTER — Other Ambulatory Visit: Payer: Self-pay | Admitting: Cardiology

## 2024-05-10 DIAGNOSIS — I10 Essential (primary) hypertension: Secondary | ICD-10-CM

## 2024-05-12 ENCOUNTER — Ambulatory Visit (INDEPENDENT_AMBULATORY_CARE_PROVIDER_SITE_OTHER)

## 2024-05-12 DIAGNOSIS — G459 Transient cerebral ischemic attack, unspecified: Secondary | ICD-10-CM

## 2024-05-12 LAB — CUP PACEART REMOTE DEVICE CHECK
Date Time Interrogation Session: 20250814092400
Implantable Pulse Generator Implant Date: 20220318
Pulse Gen Model: 436066
Pulse Gen Serial Number: 94060471

## 2024-05-15 ENCOUNTER — Ambulatory Visit: Payer: Self-pay | Admitting: Cardiology

## 2024-06-13 ENCOUNTER — Ambulatory Visit (INDEPENDENT_AMBULATORY_CARE_PROVIDER_SITE_OTHER)

## 2024-06-13 DIAGNOSIS — G459 Transient cerebral ischemic attack, unspecified: Secondary | ICD-10-CM | POA: Diagnosis not present

## 2024-06-14 LAB — CUP PACEART REMOTE DEVICE CHECK
Date Time Interrogation Session: 20250915154105
Implantable Pulse Generator Implant Date: 20220318
Pulse Gen Model: 436066
Pulse Gen Serial Number: 94060471

## 2024-06-19 ENCOUNTER — Ambulatory Visit: Payer: Self-pay | Admitting: Cardiology

## 2024-06-20 NOTE — Progress Notes (Signed)
 Remote Loop Recorder Transmission

## 2024-06-23 ENCOUNTER — Encounter

## 2024-06-23 NOTE — Progress Notes (Signed)
 Remote Loop Recorder Transmission

## 2024-07-06 NOTE — Progress Notes (Signed)
 Remote Loop Recorder Transmission

## 2024-07-14 ENCOUNTER — Ambulatory Visit

## 2024-07-14 DIAGNOSIS — G459 Transient cerebral ischemic attack, unspecified: Secondary | ICD-10-CM | POA: Diagnosis not present

## 2024-07-14 LAB — CUP PACEART REMOTE DEVICE CHECK
Date Time Interrogation Session: 20251016095642
Implantable Pulse Generator Implant Date: 20220318
Pulse Gen Model: 436066
Pulse Gen Serial Number: 94060471

## 2024-07-20 NOTE — Progress Notes (Signed)
 Remote Loop Recorder Transmission

## 2024-07-23 ENCOUNTER — Ambulatory Visit: Payer: Self-pay | Admitting: Cardiology

## 2024-07-25 ENCOUNTER — Encounter

## 2024-08-15 ENCOUNTER — Ambulatory Visit (INDEPENDENT_AMBULATORY_CARE_PROVIDER_SITE_OTHER)

## 2024-08-15 DIAGNOSIS — G459 Transient cerebral ischemic attack, unspecified: Secondary | ICD-10-CM | POA: Diagnosis not present

## 2024-08-15 LAB — CUP PACEART REMOTE DEVICE CHECK
Date Time Interrogation Session: 20251117105822
Implantable Pulse Generator Implant Date: 20220318
Pulse Gen Model: 436066
Pulse Gen Serial Number: 94060471

## 2024-08-17 ENCOUNTER — Ambulatory Visit: Payer: Self-pay | Admitting: Cardiology

## 2024-08-17 NOTE — Progress Notes (Signed)
 Remote Loop Recorder Transmission

## 2024-08-18 ENCOUNTER — Encounter: Payer: Self-pay | Admitting: Neurology

## 2024-08-25 ENCOUNTER — Encounter

## 2024-09-05 ENCOUNTER — Ambulatory Visit: Admitting: Psychiatry

## 2024-09-05 DIAGNOSIS — C561 Malignant neoplasm of right ovary: Secondary | ICD-10-CM

## 2024-09-06 NOTE — Progress Notes (Signed)
 This encounter was created in error - please disregard.

## 2024-09-12 ENCOUNTER — Encounter: Payer: Self-pay | Admitting: Psychiatry

## 2024-09-12 ENCOUNTER — Inpatient Hospital Stay: Attending: Psychiatry | Admitting: Psychiatry

## 2024-09-12 ENCOUNTER — Inpatient Hospital Stay

## 2024-09-12 VITALS — BP 134/82 | HR 72 | Temp 98.6°F | Resp 19 | Wt 228.2 lb

## 2024-09-12 DIAGNOSIS — Z90710 Acquired absence of both cervix and uterus: Secondary | ICD-10-CM | POA: Insufficient documentation

## 2024-09-12 DIAGNOSIS — Z8543 Personal history of malignant neoplasm of ovary: Secondary | ICD-10-CM | POA: Insufficient documentation

## 2024-09-12 DIAGNOSIS — C561 Malignant neoplasm of right ovary: Secondary | ICD-10-CM

## 2024-09-12 DIAGNOSIS — Z90722 Acquired absence of ovaries, bilateral: Secondary | ICD-10-CM | POA: Insufficient documentation

## 2024-09-12 NOTE — Patient Instructions (Signed)
 It was a pleasure to see you in clinic today. - Normal exam today - We will check your inhibin B today (blood marker) - Return visit planned for 6 months  Thank you very much for allowing me to provide care for you today.  I appreciate your confidence in choosing our Gynecologic Oncology team at [facility].  If you have any questions about your visit today please call our office or send us  a MyChart message and we will get back to you as soon as possible.

## 2024-09-12 NOTE — Progress Notes (Signed)
 Gynecologic Oncology Return Clinic Visit  Date of Service: 09/12/2024 Referring Provider: Jerilynn Buddle, MD   Assessment & Plan: Ebony Matthews is a 43 y.o. woman with a complex adnexal mass, s/p RA-TLH, BSO, minilap for controlled cyst drainage, peritoneal and omental biopsies on 02/09/24, final pathology showing Stage IA adult granulosa cell tumor, who presents today for surveillance  Granulosa cell tumor of ovary: - Recommend observation. - NED on exam today - Unclear if inhibin B is a marker for her. Preop value 55.8 but pt was premenopausal so could have been wnl. Postop <7.0. Will continue to check with visits. Ordered today. - Signs/symptoms of recurrence reviewed. - Surveillance reviewed. Follow-up q6 months x2 years then yearly.    RTC 32mo.  Hoy Masters, MD Gynecologic Oncology   Medical Decision Making I personally spent  TOTAL 15 minutes face-to-face and non-face-to-face in the care of this patient, which includes all pre, intra, and post visit time on the date of service.   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 Smooth ovarian mass. Serous fluid drained from mass in a controlled fashion. In pelvis filmy adhesions of omentum and rectosigmoid colon to left pelvic sidewall and adnexa. Normal appearing left ovary and fallopian tube. Normal uterus with few small subserosal fibroids. 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 RIGHT TUBE AND OVARY: - Ovary: Adult granulosa cell tumor - Fallopian tube: no significant pathologic changes - See oncology table  B. UTERUS, CERVIX, LEFT TUBE AND OVARY: - Cervix: Benign, nabothian cyst - Endometrium: Benign, no hyperplasia or malignancy identified - Myometrium: Leiomyomata - Ovary: Benign follicular cyst - Fallopian tube: No significant pathologic changes  C. ANTERIOR PERITONEAL BIOPSY: - Benign peritoneal tissue  D. RIGHT PELVIC PERITONEAL BIOPSY: - Benign peritoneal tissue  E. LEFT PELVIC PERITONEAL BIOPSY: - Benign peritoneal tissue  F. POSTERIOR CULDESAC BIOPSY: - Benign fibroadipose tissue  G. RIGHT PARACOLIC GUTTER BIOPSY: - Benign fibroadipose tissue  H. LEFT PARACOLIC GUTTER BIOPSY: - Benign fibroadipose tissue  I. OMENTAL BIOPSY: - Benign omental tissue   ONCOLOGY TABLE: SPECIMEN Procedure: Right salpingo-oophorectomy Specimen Integrity: Previously incised  TUMOR Tumor Site: Right ovary Tumor Size: Greatest dimension 5.5 cm Histologic Type: Granulosa cell tumor, adult type Histologic Grade: Not applicable Ovarian Surface Involvement: Not identified Fallopian Tube Surface Involvement: Not identified Implants: Not applicable Other Tissue/ Organ Involvement: Not applicable Largest Extrapelvic Peritoneal Focus: Not applicable Peritoneal/Ascitic Fluid Involvement: Malignant cells not identified Chemotherapy Response Score (CRS): Not applicable  REGIONAL LYMPH  NODES Regional Lymph Nodes Status: Not applicable (no regional lymph nodes submitted or found)  DISTANT METASTASIS Distant  Site(s) Involved, if applicable: Not applicable Ancillary Studies: Can be performed upon request Representative Tumor Block: A1 Comment(s): Immunohistochemical stains reveal tumor cells are positive for inhibin and calretinin, and negative for synaptophysin, CD56 keratin and EMA, and support the above interpretation.  Controls are adequate   Cytology (02/09/24): FINAL MICROSCOPIC DIAGNOSIS:  - No malignant cells identified      Interval History: He is overall doing well.  No changes in her medical history. No new vaginal bleeding, abdominal/pelvic pain, unintentional weight loss, change in bowel or bladder habits, early satiety, bloating, nausea/vomiting.     Past Medical/Surgical History: Past Medical History:  Diagnosis Date   Diabetes mellitus without complication (HCC)    Hyperlipidemia    Hypertension    Loop recorder [employer] 12/14/2020 12/14/2020   Scheduled Remote loop recorder check 12/14/2020: NSR. New implant.    Stroke [facility])    left sided weakness   TIA (transient ischemic attack) 01/22/2019    Past Surgical History:  Procedure Laterality Date   LOOP RECORDER IMPLANT     ROBOTIC ASSISTED BILATERAL SALPINGO OOPHERECTOMY N/A 02/09/2024   Procedure: ROBOTIC-ASSISTED TOTAL LAPAROSCOPIC HYSTERCTOMY, BILATERAL SALPINGO-OOPHORECTOMY, MINI LAPAROTOMY FOR CONTROLLED CYST DRAINAGE, PERITONEAL AND OMENTAL BIOPSIES;  Surgeon: Eldonna Mays, MD;  Location: [facility];  Service: Gynecology;  Laterality: N/A;  UNILATERAL SALPINGO OOPHORECTOMY WITH POSSIBLE BILATERAL OOPHORECTOMY, MINI LAPAROTOMY FOR CONTROLLED DRAINAGE, POSSIBLE STAGING    Family History  Problem Relation Age of Onset   Hypertension Maternal Grandmother    Diabetes Other    Colon cancer Neg Hx    Ovarian cancer Neg Hx    Endometrial cancer Neg Hx    Pancreatic cancer Neg Hx     Prostate cancer Neg Hx    Breast cancer Neg Hx     Social History   Socioeconomic History   Marital status: Single    Spouse name: Not on file   Number of children: 0   Years of education: Not on file   Highest education level: Not on file  Occupational History   Not on file  Tobacco Use   Smoking status: Former    Current packs/day: 0.00    Average packs/day: 1 pack/day for 15.0 years (15.0 ttl pk-yrs)    Types: Cigarettes    Start date: 2006    Quit date: 2021    Years since quitting: 4.9   Smokeless tobacco: Never  Vaping Use   Vaping status: Never Used  Substance and Sexual Activity   Alcohol use: No   Drug use: Not Currently    Types: Marijuana, Cocaine    Comment: occasional   Sexual activity: Yes    Partners: Male    Birth control/protection: None, Condom  Other Topics Concern   Not on file  Social History Narrative   Right handed   Lives with friend one story home   Social Drivers of Health   Tobacco Use: Medium Risk (02/29/2024)   Patient History    Smoking Tobacco Use: Former    Smokeless Tobacco Use: Never    Passive Exposure: Not on Actuary Strain: Not on file  Food Insecurity: No Food Insecurity (02/09/2024)   Hunger Vital Sign    Worried About Running Out of Food in the Last Year: Never true    Ran Out of Food in the Last Year: Never true  Transportation Needs: No Transportation Needs (02/09/2024)   PRAPARE - Administrator, Civil Service (Medical): No    Lack of Transportation (  Non-Medical): No  Physical Activity: Not on file  Stress: Not on file  Social Connections: Not on file  Depression (PHQ2-9): Low Risk (12/25/2023)   Depression (PHQ2-9)    PHQ-2 Score: 0  Alcohol Screen: Not on file  Housing: Low Risk (02/09/2024)   Housing Stability Vital Sign    Unable to Pay for Housing in the Last Year: No    Number of Times Moved in the Last Year: 0    Homeless in the Last Year: No  Utilities: Not At Risk (02/09/2024)    [employer] Utilities    Threatened with loss of utilities: No  Health Literacy: Not on file    Current Medications:  Current Outpatient Medications:    BD PEN NEEDLE MICRO U/F 32G X 6 MM MISC, Inject into the skin as directed., Disp: , Rfl:    clopidogrel  (PLAVIX ) 75 MG tablet, Take 75 mg by mouth daily., Disp: , Rfl:    folic acid  (FOLVITE ) 1 MG tablet, Take 1 tablet (1 mg total) by mouth daily., Disp: , Rfl:    insulin  lispro (HUMALOG) 100 UNIT/ML KwikPen, INJECT AS PER SLIDING SCALE: IF 0-120 = 0 UNITS; 121-150 = 1 UNIT; 151-200 =2 UNITS; 201-250 = 3 UNITS; 251-300 = 5 UNITS; 301-350 = 7 UNITS; 351-400 = 9 UNITS OVER 401 CALL MD FOR FURTHER ORDERS. INJECT SUBCUTANEOUSLY WITH MEALS RELATED TO TYPE DIABETES, Disp: , Rfl:    JANUVIA 100 MG tablet, Take 100 mg by mouth every morning., Disp: , Rfl:    losartan  (COZAAR ) 25 MG tablet, Take 1 tablet by mouth once daily, Disp: 30 tablet, Rfl: 6   metoprolol  tartrate (LOPRESSOR ) 25 MG tablet, Take 1 tablet (25 mg total) by mouth 2 (two) times daily., Disp: 180 tablet, Rfl: 3   nitroGLYCERIN  (NITROSTAT ) 0.4 MG SL tablet, Place 0.4 mg under the tongue every 5 (five) minutes as needed for chest pain., Disp: , Rfl:    rosuvastatin  (CRESTOR ) 10 MG tablet, Take 10 mg by mouth at bedtime., Disp: , Rfl:   Review of Symptoms: Complete 10-system review is positive for: None  Physical Exam: BP 134/82 (BP Location: Right Arm, Patient Position: Sitting)   Pulse 72   Temp 98.6 F (37 C) (Oral)   Resp 19   Wt 228 lb 3.2 oz (103.5 kg)   SpO2 100%   BMI 35.74 kg/m  General: Alert, oriented, no acute distress. HEENT: Normocephalic, atraumatic. Neck symmetric without masses. Sclera anicteric.  Chest: Normal work of breathing. Clear to auscultation bilaterally.   Cardiovascular: Regular rate and rhythm, no murmurs. Abdomen: Soft, nontender.  Normoactive bowel sounds.  No masses appreciated.  Well-healed incisions. Extremities: Grossly normal range of motion.   Warm, well perfused.  No edema bilaterally. Skin: No rashes or lesions noted. Lymphatics: No cervical, supraclavicular, or inguinal adenopathy. GU: Normal appearing external genitalia without erythema, excoriation, or lesions.  Speculum exam reveals normal vaginal mucosa without lesion.  Bimanual exam reveals smooth cuff, no pelvic mass or nodularity.  Exam chaperoned by Kimberly Jordan, CMA     Laboratory & Radiologic Studies: Lab Results  Component Value Date   INHBB <7.0 02/29/2024   INHBB 55.8 11/11/2023

## 2024-09-14 ENCOUNTER — Ambulatory Visit: Attending: Family Medicine

## 2024-09-14 DIAGNOSIS — G459 Transient cerebral ischemic attack, unspecified: Secondary | ICD-10-CM | POA: Diagnosis not present

## 2024-09-15 LAB — INHIBIN B: Inhibin B: <7 pg/mL

## 2024-09-19 ENCOUNTER — Telehealth: Payer: Self-pay

## 2024-09-19 LAB — CUP PACEART REMOTE DEVICE CHECK
Date Time Interrogation Session: 20251222093923
Implantable Pulse Generator Implant Date: 20220318
Pulse Gen Model: 436066
Pulse Gen Serial Number: 94060471

## 2024-09-19 NOTE — Telephone Encounter (Signed)
 Per Eleanor Epps NP, Pt is aware of normal inhibin B level

## 2024-09-19 NOTE — Progress Notes (Signed)
 Remote Loop Recorder Transmission

## 2024-09-19 NOTE — Telephone Encounter (Signed)
-----   Message from Eleanor Epps, NP sent at 09/19/2024  4:02 PM EST ----- Please let her know her recent inhibin B level is within normal range and stable with previous value.

## 2024-09-25 ENCOUNTER — Ambulatory Visit: Payer: Self-pay | Admitting: Cardiology

## 2024-09-26 ENCOUNTER — Encounter

## 2024-09-27 ENCOUNTER — Telehealth: Payer: Self-pay | Admitting: Neurology

## 2024-09-27 NOTE — Telephone Encounter (Signed)
 Called pt and informed her that her insurance expires 09/28/24 and we are unable to get a PA done . She is going to bring the new card to us  so that we can get  a PA done on it.

## 2024-09-27 NOTE — Telephone Encounter (Signed)
 Pt called in this morning and pt stated that she is having a hard time trying to get the [id number] - VAS US  CAROTID scheduled. Thanks

## 2024-09-27 NOTE — Telephone Encounter (Signed)
 Called pt and made her aware we were going to do a PA for her US  and that she would need to make sure she had it done. She understood and agreed. I told her we would call her back when we have it approved and she would then need to call the office and make an appointment. She understood.Ebony Matthews

## 2024-10-13 NOTE — Progress Notes (Signed)
 No Prior authorization required for CPT [phone]. Decision ID #: [id number]

## 2024-10-15 ENCOUNTER — Ambulatory Visit: Attending: Cardiology

## 2024-10-15 DIAGNOSIS — G459 Transient cerebral ischemic attack, unspecified: Secondary | ICD-10-CM | POA: Diagnosis not present

## 2024-10-17 LAB — CUP PACEART REMOTE DEVICE CHECK
Date Time Interrogation Session: 20260119085035
Implantable Pulse Generator Implant Date: 20220318
Pulse Gen Model: 436066
Pulse Gen Serial Number: 94060471

## 2024-10-20 ENCOUNTER — Ambulatory Visit: Payer: Self-pay | Admitting: Cardiology

## 2024-10-20 NOTE — Progress Notes (Signed)
 Remote Loop Recorder Transmission

## 2024-10-25 ENCOUNTER — Ambulatory Visit: Admitting: Cardiology

## 2024-10-27 ENCOUNTER — Encounter

## 2024-11-10 ENCOUNTER — Ambulatory Visit (HOSPITAL_COMMUNITY)

## 2024-11-14 ENCOUNTER — Ambulatory Visit

## 2024-11-15 ENCOUNTER — Ambulatory Visit

## 2024-12-16 ENCOUNTER — Ambulatory Visit

## 2024-12-21 ENCOUNTER — Ambulatory Visit: Admitting: Neurology

## 2024-12-27 ENCOUNTER — Ambulatory Visit: Admitting: Cardiology

## 2024-12-28 ENCOUNTER — Ambulatory Visit: Admitting: Neurology

## 2025-02-13 ENCOUNTER — Ambulatory Visit

## 2025-03-20 ENCOUNTER — Inpatient Hospital Stay: Admitting: Psychiatry

## 2025-03-20 ENCOUNTER — Inpatient Hospital Stay

## 2025-05-15 ENCOUNTER — Ambulatory Visit

## 2025-08-14 ENCOUNTER — Ambulatory Visit
